# Patient Record
Sex: Male | Born: 1950 | Race: White | Hispanic: No | Marital: Single | Smoking: Current every day smoker
Health system: Southern US, Community
[De-identification: ages and names within clinical notes are randomized; demographics above are authoritative.]

## PROBLEM LIST (undated history)

## (undated) DIAGNOSIS — K219 Gastro-esophageal reflux disease without esophagitis: Secondary | ICD-10-CM

## (undated) DIAGNOSIS — Z205 Contact with and (suspected) exposure to viral hepatitis: Secondary | ICD-10-CM

## (undated) DIAGNOSIS — F419 Anxiety disorder, unspecified: Secondary | ICD-10-CM

## (undated) DIAGNOSIS — N189 Chronic kidney disease, unspecified: Secondary | ICD-10-CM

## (undated) DIAGNOSIS — J189 Pneumonia, unspecified organism: Secondary | ICD-10-CM

## (undated) DIAGNOSIS — R51 Headache: Secondary | ICD-10-CM

## (undated) DIAGNOSIS — M51369 Other intervertebral disc degeneration, lumbar region without mention of lumbar back pain or lower extremity pain: Secondary | ICD-10-CM

## (undated) DIAGNOSIS — R519 Headache, unspecified: Secondary | ICD-10-CM

## (undated) DIAGNOSIS — Z8719 Personal history of other diseases of the digestive system: Secondary | ICD-10-CM

## (undated) DIAGNOSIS — M5136 Other intervertebral disc degeneration, lumbar region: Secondary | ICD-10-CM

## (undated) DIAGNOSIS — I10 Essential (primary) hypertension: Secondary | ICD-10-CM

## (undated) HISTORY — PX: TONSILLECTOMY: SUR1361

## (undated) HISTORY — PX: TONSILLECTOMY AND ADENOIDECTOMY: SUR1326

## (undated) HISTORY — PX: BACK SURGERY: SHX140

## (undated) HISTORY — PX: FRACTURE SURGERY: SHX138

---

## 2005-07-26 ENCOUNTER — Emergency Department: Payer: Self-pay | Admitting: Emergency Medicine

## 2006-05-01 ENCOUNTER — Encounter: Payer: Self-pay | Admitting: Family Medicine

## 2006-05-01 ENCOUNTER — Ambulatory Visit: Payer: Self-pay | Admitting: Family Medicine

## 2006-11-10 ENCOUNTER — Ambulatory Visit: Payer: Self-pay | Admitting: Family Medicine

## 2006-11-10 DIAGNOSIS — M545 Low back pain, unspecified: Secondary | ICD-10-CM | POA: Insufficient documentation

## 2007-05-10 ENCOUNTER — Ambulatory Visit: Payer: Self-pay | Admitting: Family Medicine

## 2007-05-14 ENCOUNTER — Encounter: Payer: Self-pay | Admitting: Family Medicine

## 2007-05-23 ENCOUNTER — Encounter: Admission: RE | Admit: 2007-05-23 | Discharge: 2007-05-23 | Payer: Self-pay | Admitting: Neurosurgery

## 2007-05-28 ENCOUNTER — Encounter: Payer: Self-pay | Admitting: Family Medicine

## 2009-11-26 ENCOUNTER — Encounter (INDEPENDENT_AMBULATORY_CARE_PROVIDER_SITE_OTHER): Payer: Self-pay | Admitting: *Deleted

## 2010-01-01 ENCOUNTER — Ambulatory Visit: Payer: Self-pay | Admitting: Specialist

## 2010-05-22 NOTE — Letter (Signed)
Summary: Nadara Eaton letter  [place] at [facility]  [address] [place], [place] [address]   Phone: [phone]  Fax: [phone]       11/26/2009 MRN: [id number]  [facility] [address] [place], [place]  [address]  Dear Mr. Gittings,  [facility] [place] [place] announce the retirement of Arta Silence, M.D., from full-time practice at the [facility] office effective October 18, 2009 and his plans of returning part-time.  It is important to Dr. Hetty Ely and to our practice that you understand that [facility] has seven physicians in our office for your health care needs.  We will continue to offer the same exceptional care that you have today.    Dr. Hetty Ely has spoken to many of you about his plans for retirement and returning part-time in the fall.   We will continue to work with you through the transition to schedule appointments for you in the office and meet the high standards that [facility] is committed to.   Again, it is with great pleasure that we share the news that Dr. Hetty Ely will return to [facility] at [facility] in October of 2011 with a reduced schedule.    If you have any questions, or would like to request an appointment with one of our physicians, please call us at [phone] and press the option for Scheduling an appointment.  We take pleasure in providing you with excellent patient care and look forward to seeing you at your next office visit.  Our [facility] Physicians are:  Tillman Abide, M.D. Laurita Quint, M.D. Roxy Manns, M.D. Kerby Nora, M.D. Hannah Beat, M.D. Ruthe Mannan, M.D. We proudly welcomed Raechel Ache, M.D. and Eustaquio Boyden, M.D. to the practice in July/August 2011.  Sincerely,  [facility]

## 2011-12-12 DIAGNOSIS — M5126 Other intervertebral disc displacement, lumbar region: Secondary | ICD-10-CM | POA: Insufficient documentation

## 2011-12-12 DIAGNOSIS — G8929 Other chronic pain: Secondary | ICD-10-CM | POA: Insufficient documentation

## 2011-12-12 DIAGNOSIS — K922 Gastrointestinal hemorrhage, unspecified: Secondary | ICD-10-CM | POA: Insufficient documentation

## 2011-12-12 DIAGNOSIS — N529 Male erectile dysfunction, unspecified: Secondary | ICD-10-CM | POA: Insufficient documentation

## 2011-12-12 DIAGNOSIS — R21 Rash and other nonspecific skin eruption: Secondary | ICD-10-CM | POA: Insufficient documentation

## 2011-12-15 DIAGNOSIS — Z0189 Encounter for other specified special examinations: Secondary | ICD-10-CM | POA: Insufficient documentation

## 2011-12-15 DIAGNOSIS — N400 Enlarged prostate without lower urinary tract symptoms: Secondary | ICD-10-CM | POA: Insufficient documentation

## 2012-05-05 ENCOUNTER — Ambulatory Visit: Payer: Self-pay | Admitting: Physician Assistant

## 2012-12-23 DIAGNOSIS — IMO0002 Reserved for concepts with insufficient information to code with codable children: Secondary | ICD-10-CM | POA: Insufficient documentation

## 2013-10-31 DIAGNOSIS — R768 Other specified abnormal immunological findings in serum: Secondary | ICD-10-CM | POA: Insufficient documentation

## 2013-10-31 DIAGNOSIS — Z789 Other specified health status: Secondary | ICD-10-CM | POA: Insufficient documentation

## 2013-11-04 DIAGNOSIS — L720 Epidermal cyst: Secondary | ICD-10-CM | POA: Insufficient documentation

## 2014-01-09 ENCOUNTER — Ambulatory Visit: Payer: Self-pay | Admitting: Pain Medicine

## 2014-01-17 ENCOUNTER — Ambulatory Visit: Payer: Self-pay | Admitting: Gastroenterology

## 2015-04-15 ENCOUNTER — Encounter: Payer: Self-pay | Admitting: Emergency Medicine

## 2015-04-15 ENCOUNTER — Emergency Department
Admission: EM | Admit: 2015-04-15 | Discharge: 2015-04-15 | Disposition: A | Discharge status: Home / Self Care | Payer: 59 | Attending: Emergency Medicine | Admitting: Emergency Medicine

## 2015-04-15 DIAGNOSIS — M5136 Other intervertebral disc degeneration, lumbar region: Secondary | ICD-10-CM | POA: Diagnosis not present

## 2015-04-15 DIAGNOSIS — M5442 Lumbago with sciatica, left side: Secondary | ICD-10-CM

## 2015-04-15 DIAGNOSIS — M545 Low back pain: Secondary | ICD-10-CM | POA: Diagnosis present

## 2015-04-15 HISTORY — DX: Other intervertebral disc degeneration, lumbar region without mention of lumbar back pain or lower extremity pain: M51.369

## 2015-04-15 HISTORY — DX: Other intervertebral disc degeneration, lumbar region: M51.36

## 2015-04-15 MED ORDER — DIAZEPAM 2 MG PO TABS: 2.0000 mg | ORAL_TABLET | Freq: Three times a day (TID) | ORAL | Status: DC | PRN

## 2015-04-15 MED ORDER — TRAMADOL HCL 50 MG PO TABS: 50.0000 mg | ORAL_TABLET | Freq: Four times a day (QID) | ORAL | Status: DC | PRN

## 2015-04-15 NOTE — Discharge Instructions (Signed)
Chronic Back Pain  When back pain lasts longer than 3 months, it is called chronic back pain.People with chronic back pain often go through certain periods that are more intense (flare-ups).  CAUSES Chronic back pain can be caused by wear and tear (degeneration) on different structures in your back. These structures include:  The bones of your spine (vertebrae) and the joints surrounding your spinal cord and nerve roots (facets).  The strong, fibrous tissues that connect your vertebrae (ligaments). Degeneration of these structures may result in pressure on your nerves. This can lead to constant pain. HOME CARE INSTRUCTIONS  Avoid bending, heavy lifting, prolonged sitting, and activities which make the problem worse.  Take brief periods of rest throughout the day to reduce your pain. Lying down or standing usually is better than sitting while you are resting.  Take over-the-counter or prescription medicines only as directed by your caregiver. SEEK IMMEDIATE MEDICAL CARE IF:   You have weakness or numbness in one of your legs or feet.  You have trouble controlling your bladder or bowels.  You have nausea, vomiting, abdominal pain, shortness of breath, or fainting.   This information is not intended to replace advice given to you by your health care provider. Make sure you discuss any questions you have with your health care provider.   Document Released: 05/15/2004 Document Revised: 06/30/2011 Document Reviewed: 09/25/2014 Elsevier Interactive Patient Education 2016 Elsevier Inc.  Degenerative Disk Disease Degenerative disk disease is a condition caused by the changes that occur in spinal disks as you grow older. Spinal disks are soft and compressible disks located between the bones of your spine (vertebrae). These disks act like shock absorbers. Degenerative disk disease can affect the whole spine. However, the neck and lower back are most commonly affected. Many changes can occur in  the spinal disks with aging, such as:  The spinal disks may dry and shrink.  Small tears may occur in the tough, outer covering of the disk (annulus).  The disk space may become smaller due to loss of water.  Abnormal growths in the bone (spurs) may occur. This can put pressure on the nerve roots exiting the spinal canal, causing pain.  The spinal canal may become narrowed. RISK FACTORS   Being overweight.  Having a family history of degenerative disk disease.  Smoking.  There is increased risk if you are doing heavy lifting or have a sudden injury. SIGNS AND SYMPTOMS  Symptoms vary from person to person and may include:  Pain that varies in intensity. Some people have no pain, while others have severe pain. The location of the pain depends on the part of your backbone that is affected.  You will have neck or arm pain if a disk in the neck area is affected.  You will have pain in your back, buttocks, or legs if a disk in the lower back is affected.  Pain that becomes worse while bending, reaching up, or with twisting movements.  Pain that may start gradually and then get worse as time passes. It may also start after a major or minor injury.  Numbness or tingling in the arms or legs. DIAGNOSIS  Your health care provider will ask you about your symptoms and about activities or habits that may cause the pain. He or she may also ask about any injuries, diseases, or treatments you have had. Your health care provider will examine you to check for the range of movement that is possible in the affected area, to  check for strength in your extremities, and to check for sensation in the areas of the arms and legs supplied by different nerve roots. You may also have:   An X-ray of the spine.  Other imaging tests, such as MRI. TREATMENT  Your health care provider will advise you on the best plan for treatment. Treatment may include:  Medicines.  Rehabilitation exercises. HOME CARE  INSTRUCTIONS   Follow proper lifting and walking techniques as advised by your health care provider.  Maintain good posture.  Exercise regularly as advised by your health care provider.  Perform relaxation exercises.  Change your sitting, standing, and sleeping habits as advised by your health care provider.  Change positions frequently.  Lose weight or maintain a healthy weight as advised by your health care provider.  Do not use any tobacco products, including cigarettes, chewing tobacco, or electronic cigarettes. If you need help quitting, ask your health care provider.  Wear supportive footwear.  Take medicines only as directed by your health care provider. SEEK MEDICAL CARE IF:   Your pain does not go away within 1-4 weeks.  You have significant appetite or weight loss. SEEK IMMEDIATE MEDICAL CARE IF:   Your pain is severe.  You notice weakness in your arms, hands, or legs.  You begin to lose control of your bladder or bowel movements.  You have fevers or night sweats. MAKE SURE YOU:   Understand these instructions.  Will watch your condition.  Will get help right away if you are not doing well or get worse.   This information is not intended to replace advice given to you by your health care provider. Make sure you discuss any questions you have with your health care provider.   Document Released: 02/02/2007 Document Revised: 04/28/2014 Document Reviewed: 08/09/2013 Elsevier Interactive Patient Education [employer]2016 Elsevier Inc.

## 2015-04-15 NOTE — ED Provider Notes (Signed)
[facility] Emergency Department Provider Note  ____________________________________________  Time seen: Approximately 10:01 AM  I have reviewed the triage vital signs and the nursing notes.   HISTORY  Chief Complaint Back Pain    HPI Chad [name] is a 64 y.o. male who presents to the emergency department complaining of lower back pain. He states that he has a history of degenerative disc disease in his lumbar region. He has seen a spine specialist and told that he is not a surgical candidate. He denies any specific injury leading up to this incident of pain. He states pain is in the lower left region radiating down his left leg. He was seen in urgent care approximately 10 days ago and started on by mouth Toradol. He states that he has minor relief but is still continuing to experience symptoms. Patient states he does have some mild numbness tingling on the lateral aspect of left leg. No bowel or bladder dysfunction.no gait changes. Patient states that he does work out on a regular basis and has continued to do so.   Past Medical History  Diagnosis Date  . Degenerative disc disease, lumbar     Patient Active Problem List   Diagnosis Date Noted  . BACK PAIN, LUMBAR 11/10/2006    History reviewed. No pertinent past surgical history.  Current Outpatient Rx  Name  Route  Sig  Dispense  Refill  . ketorolac (TORADOL) 10 MG tablet   Oral   Take 10 mg by mouth every 6 (six) hours as needed.         . diazepam (VALIUM) 2 MG tablet   Oral   Take 1 tablet (2 mg total) by mouth every 8 (eight) hours as needed for anxiety.   15 tablet   0   . traMADol (ULTRAM) 50 MG tablet   Oral   Take 1 tablet (50 mg total) by mouth every 6 (six) hours as needed.   20 tablet   0     Allergies Hydrocodone-acetaminophen  No family history on file.  Social History Social History  Substance Use Topics  . Smoking status: Never Smoker   . Smokeless tobacco: None   . Alcohol Use: No    Review of Systems Constitutional: No fever/chills Eyes: No visual changes. ENT: No sore throat. Cardiovascular: Denies chest pain. Respiratory: Denies shortness of breath. Gastrointestinal: No abdominal pain.  No nausea, no vomiting.  No diarrhea.  No constipation. Genitourinary: Negative for dysuria. Musculoskeletal: Endorses left-sided lower back pain. Skin: Negative for rash. Neurological: Negative for headaches, focal weakness or numbness.  10-point ROS otherwise negative.  ____________________________________________   PHYSICAL EXAM:  VITAL SIGNS: ED Triage Vitals  Enc Vitals Group     BP 04/15/15 0957 152/96 mmHg     Pulse Rate 04/15/15 0957 86     Resp 04/15/15 0957 18     Temp 04/15/15 0957 98.1 F (36.7 C)     Temp src --      SpO2 04/15/15 0957 99 %     Weight 04/15/15 0957 175 lb (79.379 kg)     Height 04/15/15 0957  (1.753 m)     Head Cir --      Peak Flow --      Pain Score 04/15/15 0958 9     Pain Loc --      Pain Edu? --      Excl. in GC? --     Constitutional: Alert and oriented. Well appearing and in  no acute distress. Eyes: Conjunctivae are normal. PERRL. EOMI. Head: Atraumatic. Nose: No congestion/rhinnorhea. Mouth/Throat: Mucous membranes are moist.  Oropharynx non-erythematous. Neck: No stridor.   Cardiovascular: Normal rate, regular rhythm. Grossly normal heart sounds.  Good peripheral circulation. Respiratory: Normal respiratory effort.  No retractions. Lungs CTAB. Gastrointestinal: Soft and nontender. No distention. No abdominal bruits. No CVA tenderness. Musculoskeletal: No lower extremity tenderness nor edema.  No joint effusions. No visible deformity to spine. Inspection. No abnormalities to palpation. No scoliosis or lordosis noted. No tenderness to palpation midline spinal processes. Patient is diffusely tender to palpation over the paraspinal muscle group left side lumbar region. Positive straight leg raise  on left side. Pulses and sensation intact distally. Neurologic:  Normal speech and language. No gross focal neurologic deficits are appreciated. No gait instability. Skin:  Skin is warm, dry and intact. No rash noted. Psychiatric: Mood and affect are normal. Speech and behavior are normal.  ____________________________________________   LABS (all labs ordered are listed, but only abnormal results are displayed)  Labs Reviewed - No data to display ____________________________________________  EKG   ____________________________________________  RADIOLOGY   ____________________________________________   PROCEDURES  Procedure(s) performed: None  Critical Care performed: No  ____________________________________________   INITIAL IMPRESSION / ASSESSMENT AND PLAN / ED COURSE  Pertinent labs & imaging results that were available during my care of the patient were reviewed by me and considered in my medical decision making (see chart for details).  Patient's diagnosis is consistent with degenerative disc disease with sciatica. Patient is already on anti-inflammatories. I will start patient on muscle relaxers and limited narcotics for pain control. Patient is not a surgical candidate according to spine specialist and has been recommended to be seen by pain management. Patient is currently not on pain management services. Patient is to follow-up with his spine specialist.    New Prescriptions   DIAZEPAM (VALIUM) 2 MG TABLET    Take 1 tablet (2 mg total) by mouth every 8 (eight) hours as needed for anxiety.   TRAMADOL (ULTRAM) 50 MG TABLET    Take 1 tablet (50 mg total) by mouth every 6 (six) hours as needed.    ____________________________________________   FINAL CLINICAL IMPRESSION(S) / ED DIAGNOSES  Final diagnoses:  Degenerative disc disease, lumbar  Acute left-sided low back pain with left-sided sciatica      Racheal PatchesJonathan D Cuthriell, PA-C 04/15/15 1016  Jene Everyobert  Kinner, MD 04/15/15 1407

## 2015-04-15 NOTE — ED Notes (Addendum)
Patient to ER for c/o lower back pain that radiates down leg. Has h/o degenerative disc disease. Saw urgent care last week x2 with no relief from Toradol. Denies any known injury.

## 2015-04-15 NOTE — ED Notes (Signed)
Lower back pain for about 1-2 weeks w/o injury.no fever or urinary sxs' has been seen for same times 2  Ambulates with sl limp d/t pain

## 2015-04-29 ENCOUNTER — Emergency Department
Admission: EM | Admit: 2015-04-29 | Discharge: 2015-04-29 | Disposition: A | Discharge status: Home / Self Care | Payer: Managed Care, Other (non HMO) | Attending: Emergency Medicine | Admitting: Emergency Medicine

## 2015-04-29 ENCOUNTER — Encounter: Payer: Self-pay | Admitting: Emergency Medicine

## 2015-04-29 DIAGNOSIS — M5136 Other intervertebral disc degeneration, lumbar region: Secondary | ICD-10-CM

## 2015-04-29 DIAGNOSIS — M5126 Other intervertebral disc displacement, lumbar region: Secondary | ICD-10-CM | POA: Diagnosis not present

## 2015-04-29 DIAGNOSIS — M545 Low back pain: Secondary | ICD-10-CM | POA: Diagnosis present

## 2015-04-29 MED ORDER — CYCLOBENZAPRINE HCL 10 MG PO TABS: 10.0000 mg | ORAL_TABLET | Freq: Three times a day (TID) | ORAL | Status: DC | PRN

## 2015-04-29 MED ORDER — OXYCODONE-ACETAMINOPHEN 10-325 MG PO TABS: 1.0000 | ORAL_TABLET | Freq: Four times a day (QID) | ORAL | Status: AC | PRN

## 2015-04-29 MED ORDER — IBUPROFEN 800 MG PO TABS: 800.0000 mg | ORAL_TABLET | Freq: Three times a day (TID) | ORAL | Status: DC | PRN

## 2015-04-29 NOTE — ED Notes (Signed)
Pt c/o lower back pain.  Seen here on christmas and given tramadol which he reports does not touch pain.  Had MRI Friday but won't get results for week and needs something else for pain.  He said they think he has herniated disc.

## 2015-04-29 NOTE — Discharge Instructions (Signed)
Herniated Disk  A herniated disk occurs when a disk in your spine bulges out too far. This condition is also called a ruptured disk or slipped disk. Your spine (backbone) is made up of bones called vertebrae. Between each pair of vertebrae is an oval disk with a soft, spongy center that acts as a shock absorber when you move. The spongy center is surrounded by a tough outer ring.  When you have a herniated disk, the spongy center of the disk bulges out or ruptures through the outer ring. A herniated disk can press on a nerve between your vertebrae and cause pain. A herniated disk can occur anywhere in your back or neck area, but the lower back is the most common spot.  CAUSES   In many cases, a herniated disk occurs just from getting older. As you age, the spongy insides of your disks tend to shrink and dry out. A herniated disk can result from gradual wear and tear. Injury or sudden strain can also cause a herniated disk.   RISK FACTORS  Aging is the main risk factor for a herniated disk. Other risk factors include:   Being a man between the ages of 30 and 50 years.   Having a job that requires heavy lifting, bending, or twisting.   Having a job that requires long hours of driving.   Not getting enough exercise.   Being overweight.   Smoking.  SIGNS AND SYMPTOMS   Signs and symptoms depend on which disk is herniated.   For a herniated disk in the lower back, you may have sharp pain in:    One part of your leg, hip, or buttocks.    The back of your calf.    The top or sole of your foot (sciatica).    For a herniated disk in the neck, you may feel pain:    When you move your neck.    Near or over your shoulder blade.    That moves to your upper arm, forearm, or fingers.    You may also have muscle weakness. It may be hard to:    Lift your leg or arm.    Stand on your toes.    Squeeze tightly with one of your hands.   Other symptoms can include:    Numbness or tingling in the affected areas of your body.     Loss of bladder or bowel control. This is a rare but serious sign of a severe herniated disk in the lower back.  DIAGNOSIS   Your health care provider will do a physical exam. During this exam, you may have to move certain body parts or assume various positions. For example, your health care provider may do the straight-leg test. This is a good way to test for a herniated disk in your lower back. In this test, the health care provider lifts your leg while you lie on your back. This is to see if you feel pain down your leg. Your health care provider will also check for numbness or loss of feeling.   Your health care provider will also check your:   Reflexes.   Muscle strength.   Posture.   Other tests may be done to help in making a diagnosis. These may include:   An X-ray of the spine to rule out other causes of back pain.    Other imaging studies, such as an MRI or CT scan. This is to check whether the herniated disk is   pressing on your spinal canal.   Electromyography (EMG). This test checks the nerves that control muscles. It is sometimes used to identify the specific area of nerve involvement.   TREATMENT   In many cases, herniated disk symptoms go away over a period of days or weeks. You will most likely be free of symptoms in 3-4 months. Treatment may include the following:   The initial treatment for a herniated disk is ashort period of rest.    Bed rest is often limited to 1 or 2 days. Resting for too long delays recovery.    If you have a herniated disk in your lower back, you should avoid sitting as much as possible because sitting increases pressure on the disk.   Medicines. These may include:     Nonsteroidal anti-inflammatory drugs (NSAIDs).    Muscle relaxants for back spasms.    Narcotic pain medicine if your pain is very bad.    Steroid injections. You may need these along the involved nerve root to help control pain. The steroid is injected in the area of the herniated disk. It  helps by reducing swelling around the disk.   Physical therapy. This may include exercises to strengthen the muscles that help support your spine.    You may need surgery if other treatments do not work.   HOME CARE INSTRUCTIONS  Follow all your health care provider's instructions. These may include:   Take all medicines as directed by your health care provider.   Rest for 2 days and then start moving.   Do not sit or stand for long periods of time.   Maintain good posture when sitting and standing.   Avoid movements that cause pain, such as bending or lifting.   When you are able to start lifting things again:   Bend with your knees.   Keep your back straight.   Hold heavy objects close to your body.   If you are overweight, ask your health care provider to help you start a weight-loss program.   When you are able to start exercising, ask your health care provider how much and what type of exercise is best for you.   Work with a physical therapist on stretching and strengthening exercises for your back.   Do not wear high-heeled shoes.   Do not sleep on your belly.   Do not smoke.   Keep all follow-up visits as directed by your health care provider.  SEEK MEDICAL CARE IF:   You have back or neck pain that is not getting better after 4 weeks.   You have very bad pain in your back or neck.   You develop numbness, tingling, or weakness along with pain.  SEEK IMMEDIATE MEDICAL CARE IF:    You have numbness, tingling, or weakness that makes you unable to use your arms or legs.   You lose control of your bladder or bowels.   You have dizziness or fainting.   You have shortness of breath.   MAKE SURE YOU:    Understand these instructions.   Will watch your condition.   Will get help right away if you are not doing well or get worse.     This information is not intended to replace advice given to you by your health care provider. Make sure you discuss any questions you have with your health  care provider.     Document Released: 04/04/2000 Document Revised: 04/28/2014 Document Reviewed: 03/11/2013  Elsevier Interactive Patient Education   2016 Else[employer] Inc.

## 2015-04-29 NOTE — ED Provider Notes (Signed)
[facility] Emergency Department Provider Note  ____________________________________________  Time seen: Approximately 2:50 PM  I have reviewed the triage vital signs and the nursing notes.   HISTORY  Chief Complaint Back Pain    HPI Chad [name] is a 65 y.o. male presents for evaluation of left lower back pain. Patient states he was seen here on Christmas day for same. No relief with current medications. Just had an MRI this week.Planes of continuous pain radiating down his left leg and groin.   Past Medical History  Diagnosis Date  . Degenerative disc disease, lumbar     Patient Active Problem List   Diagnosis Date Noted  . BACK PAIN, LUMBAR 11/10/2006    History reviewed. No pertinent past surgical history.  Current Outpatient Rx  Name  Route  Sig  Dispense  Refill  . cyclobenzaprine (FLEXERIL) 10 MG tablet   Oral   Take 1 tablet (10 mg total) by mouth every 8 (eight) hours as needed for muscle spasms.   30 tablet   1   . ibuprofen (ADVIL,MOTRIN) 800 MG tablet   Oral   Take 1 tablet (800 mg total) by mouth every 8 (eight) hours as needed.   30 tablet   0   . oxyCODONE-acetaminophen (PERCOCET) 10-325 MG tablet   Oral   Take 1 tablet by mouth every 6 (six) hours as needed for pain.   30 tablet   0     Allergies Hydrocodone-acetaminophen  History reviewed. No pertinent family history.  Social History Social History  Substance Use Topics  . Smoking status: Never Smoker   . Smokeless tobacco: None  . Alcohol Use: No    Review of Systems Constitutional: No fever/chills Eyes: No visual changes. ENT: No sore throat. Cardiovascular: Denies chest pain. Respiratory: Denies shortness of breath. Gastrointestinal: No abdominal pain.  No nausea, no vomiting.  No diarrhea.  No constipation. Genitourinary: Negative for dysuria. Musculoskeletal: Positive for low back pain. Skin: Negative for rash. Neurological: Negative for  headaches, focal weakness or numbness.  10-point ROS otherwise negative.  ____________________________________________   PHYSICAL EXAM:  VITAL SIGNS: ED Triage Vitals  Enc Vitals Group     BP 04/29/15 1351 135/103 mmHg     Pulse Rate 04/29/15 1350 96     Resp 04/29/15 1350 16     Temp 04/29/15 1350 97.7 F (36.5 C)     Temp Source 04/29/15 1350 Oral     SpO2 04/29/15 1350 100 %     Weight 04/29/15 1350 180 lb (81.647 kg)     Height 04/29/15 1350 5\' 9"  (1.753 m)     Head Cir --      Peak Flow --      Pain Score 04/29/15 1350 10     Pain Loc --      Pain Edu? --      Excl. in GC? --     Constitutional: Alert and oriented. Well appearing and in no acute distress.   Cardiovascular: Normal rate, regular rhythm. Grossly normal heart sounds.  Good peripheral circulation. Respiratory: Normal respiratory effort.  No retractions. Lungs CTAB. Gastrointestinal: Soft and nontender. No distention. No abdominal bruits. No CVA tenderness. Musculoskeletal: No lower extremity tenderness nor edema.  No joint effusions. Neurologic:  Normal speech and language. No gross focal neurologic deficits are appreciated. No gait instability. Skin:  Skin is warm, dry and intact. No rash noted. Psychiatric: Mood and affect are normal. Speech and behavior are normal.  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  ____________________________________________   PROCEDURES  Procedure(s) performed: None  Critical Care performed:  No  ____________________________________________   INITIAL IMPRESSION / ASSESSMENT AND PLAN / ED COURSE  Pertinent labs & imaging results that were available during my care of the patient were reviewed by me and considered in my medical decision making (see chart for details).  Acute lumbar bulging disc. Rx given for Percocet 5/325 and referral to neurosurgery. Patient follow-up PCP or return to the ER as needed. Patient also given prescription for Flexeril 10 mg 3 times a day and Motrin 800 mg 3 times a day. Highly unlikely to medication profile any difference to the patient's pain. Neurosurgery or pain management. ____________________________________________   FINAL CLINICAL IMPRESSION(S) / ED DIAGNOSES  Final diagnoses:  Bulging lumbar disc      Evangeline Dakin, PA-C 04/29/15 1459  Rockne Menghini, MD 04/29/15 1503

## 2015-05-11 ENCOUNTER — Other Ambulatory Visit: Payer: Self-pay | Admitting: Neurosurgery

## 2015-05-30 ENCOUNTER — Other Ambulatory Visit (HOSPITAL_COMMUNITY): Payer: Self-pay | Admitting: *Deleted

## 2015-05-30 ENCOUNTER — Encounter (HOSPITAL_COMMUNITY): Payer: Self-pay

## 2015-05-30 ENCOUNTER — Encounter (HOSPITAL_COMMUNITY)
Admission: RE | Admit: 2015-05-30 | Discharge: 2015-05-30 | Disposition: A | Discharge status: Home / Self Care | Payer: Managed Care, Other (non HMO) | Source: Ambulatory Visit | Attending: Neurosurgery | Admitting: Neurosurgery

## 2015-05-30 DIAGNOSIS — Z0183 Encounter for blood typing: Secondary | ICD-10-CM | POA: Diagnosis not present

## 2015-05-30 DIAGNOSIS — I129 Hypertensive chronic kidney disease with stage 1 through stage 4 chronic kidney disease, or unspecified chronic kidney disease: Secondary | ICD-10-CM | POA: Diagnosis not present

## 2015-05-30 DIAGNOSIS — N189 Chronic kidney disease, unspecified: Secondary | ICD-10-CM | POA: Diagnosis not present

## 2015-05-30 DIAGNOSIS — Z01812 Encounter for preprocedural laboratory examination: Secondary | ICD-10-CM | POA: Insufficient documentation

## 2015-05-30 DIAGNOSIS — Z79899 Other long term (current) drug therapy: Secondary | ICD-10-CM | POA: Insufficient documentation

## 2015-05-30 DIAGNOSIS — Z01818 Encounter for other preprocedural examination: Secondary | ICD-10-CM | POA: Insufficient documentation

## 2015-05-30 DIAGNOSIS — M4806 Spinal stenosis, lumbar region: Secondary | ICD-10-CM | POA: Diagnosis not present

## 2015-05-30 HISTORY — DX: Contact with and (suspected) exposure to viral hepatitis: Z20.5

## 2015-05-30 HISTORY — DX: Pneumonia, unspecified organism: J18.9

## 2015-05-30 HISTORY — DX: Chronic kidney disease, unspecified: N18.9

## 2015-05-30 HISTORY — DX: Gastro-esophageal reflux disease without esophagitis: K21.9

## 2015-05-30 HISTORY — DX: Headache, unspecified: R51.9

## 2015-05-30 HISTORY — DX: Headache: R51

## 2015-05-30 HISTORY — DX: Personal history of other diseases of the digestive system: Z87.19

## 2015-05-30 HISTORY — DX: Essential (primary) hypertension: I10

## 2015-05-30 HISTORY — DX: Anxiety disorder, unspecified: F41.9

## 2015-05-30 LAB — COMPREHENSIVE METABOLIC PANEL
ALK PHOS: 46 U/L (ref 38–126)
ALT: 34 U/L (ref 17–63)
AST: 39 U/L (ref 15–41)
Albumin: 2.9 g/dL — ABNORMAL LOW (ref 3.5–5.0)
Anion gap: 9 (ref 5–15)
BUN: 9 mg/dL (ref 6–20)
CALCIUM: 9 mg/dL (ref 8.9–10.3)
CO2: 28 mmol/L (ref 22–32)
CREATININE: 1.32 mg/dL — AB (ref 0.61–1.24)
Chloride: 102 mmol/L (ref 101–111)
GFR, EST NON AFRICAN AMERICAN: 56 mL/min — AB (ref >60)
Glucose, Bld: 74 mg/dL (ref 65–99)
Potassium: 4 mmol/L (ref 3.5–5.1)
Sodium: 139 mmol/L (ref 135–145)
Total Bilirubin: 0.7 mg/dL (ref 0.3–1.2)
Total Protein: 5.5 g/dL — ABNORMAL LOW (ref 6.5–8.1)

## 2015-05-30 LAB — CBC
HCT: 44.2 % (ref 39.0–52.0)
Hemoglobin: 14.4 g/dL (ref 13.0–17.0)
MCH: 28.6 pg (ref 26.0–34.0)
MCHC: 32.6 g/dL (ref 30.0–36.0)
MCV: 87.7 fL (ref 78.0–100.0)
PLATELETS: 305 10*3/uL (ref 150–400)
RBC: 5.04 MIL/uL (ref 4.22–5.81)
RDW: 14.2 % (ref 11.5–15.5)
WBC: 9.6 10*3/uL (ref 4.0–10.5)

## 2015-05-30 LAB — PROTIME-INR
INR: 1.02 (ref 0.00–1.49)
PROTHROMBIN TIME: 13.6 s (ref 11.6–15.2)

## 2015-05-30 LAB — SURGICAL PCR SCREEN
MRSA, PCR: NEGATIVE
Staphylococcus aureus: POSITIVE — AB

## 2015-05-30 LAB — TYPE AND SCREEN
ABO/RH(D): A POS
ANTIBODY SCREEN: NEGATIVE

## 2015-05-30 LAB — ABO/RH: ABO/RH(D): A POS

## 2015-05-30 NOTE — Progress Notes (Addendum)
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 Have called Rx in to [facility] in [place].

## 2015-05-30 NOTE — Pre-Procedure Instructions (Signed)
Chad [name]  05/30/2015      [id number] - [name], [place] - [address] [place] [address] Phone: [phone] Fax: [phone]    Your procedure is scheduled on Thursday, June 07, 2015 at 7:30 AM.   Report to [facility] Entrance "A" Admitting Office at 5:30 AM.   Call this number if you have problems the morning of surgery: [phone]   Any questions prior to day of surgery, please call [phone] between 8 & 4 PM.   Remember:  Do not eat food or drink liquids after midnight Wednesday, 06/06/15.  Take these medicines the morning of surgery with A SIP OF WATER: Escitalopram (Lexapro), Oxycodone - if needed  Stop Aspirin products (BC powders, Goody's, etc) as of today.   Do not wear jewelry.  Do not wear lotions, powders, or cologne.  You may wear deodorant.  Men may shave face and neck.  Do not bring valuables to the hospital.  [facility] is not responsible for any belongings or valuables.  Contacts, dentures or bridgework may not be worn into surgery.  Leave your suitcase in the car.  After surgery it may be brought to your room.  For patients admitted to the hospital, discharge time will be determined by your treatment team.  Special instructions:  [place] - Preparing for Surgery  Before surgery, you can play an important role.  Because skin is not sterile, your skin needs to be as free of germs as possible.  You can reduce the number of germs on you skin by washing with CHG (chlorahexidine gluconate) soap before surgery.  CHG is an antiseptic cleaner which kills germs and bonds with the skin to continue killing germs even after washing.  Please DO NOT use if you have an allergy to CHG or antibacterial soaps.  If your skin becomes reddened/irritated stop using the CHG and inform your nurse when you arrive at Short Stay.  Do not shave (including legs and underarms) for at least 48 hours prior to the first CHG shower.  You may  shave your face.  Please follow these instructions carefully:   1.  Shower with CHG Soap the night before surgery and the                                morning of Surgery.  2.  If you choose to wash your hair, wash your hair first as usual with your       normal shampoo.  3.  After you shampoo, rinse your hair and body thoroughly to remove the                      Shampoo.  4.  Use CHG as you would any other liquid soap.  You can apply chg directly       to the skin and wash gently with scrungie or a clean washcloth.  5.  Apply the CHG Soap to your body ONLY FROM THE NECK DOWN.        Do not use on open wounds or open sores.  Avoid contact with your eyes, ears, mouth and genitals (private parts).  Wash genitals (private parts) with your normal soap.  6.  Wash thoroughly, paying special attention to the area where your surgery        will be performed.  7.  Thoroughly rinse your body  with warm water from the neck down.  8.  DO NOT shower/wash with your normal soap after using and rinsing off       the CHG Soap.  9.  Pat yourself dry with a clean towel.            10.  Wear clean pajamas.            11.  Place clean sheets on your bed the night of your first shower and do not        sleep with pets.  Day of Surgery  Do not apply any lotions the morning of surgery.  Please wear clean clothes to the hospital.   Please read over the following fact sheets that you were given. Pain Booklet, Coughing and Deep Breathing, Blood Transfusion Information, MRSA Information and Surgical Site Infection Prevention

## 2015-06-01 ENCOUNTER — Encounter (HOSPITAL_COMMUNITY): Payer: Self-pay

## 2015-06-01 NOTE — Progress Notes (Signed)
Anesthesia Chart Review:  Pt is a 65 year old male scheduled for L4-5 posterior lumbar fusion on 06/07/2015 with Dr. Newell Coral.   PMH includes: HTN (diet controlled), CKD, exposure to hepatitis C. Never smoker. BMI 26.  Medications include: doxazosin  Preoperative labs reviewed.    EKG 06/26/14: NSR. LVH. Anterior ST elevation, probably due to LVH. EKG obtained during visit with PCP for f/u HTN; confirmed by Dr. Marlane Mingle, cardiologist at [facility].   If no changes, I anticipate pt can proceed with surgery as scheduled.   Rica Mast, FNP-BC [facility] Short Stay Surgical [facility]/Anesthesiology Phone: [phone] 06/01/2015 11:38 AM

## 2015-06-06 MED ORDER — CEFAZOLIN SODIUM-DEXTROSE 2-3 GM-% IV SOLR
2.0000 g | INTRAVENOUS | Status: AC
Start: 2015-06-07 — End: 2015-06-07
  Administered 2015-06-07: 2 g via INTRAVENOUS
  Filled 2015-06-06: qty 50

## 2015-06-07 ENCOUNTER — Inpatient Hospital Stay (HOSPITAL_COMMUNITY): Payer: Managed Care, Other (non HMO) | Admitting: Anesthesiology

## 2015-06-07 ENCOUNTER — Encounter (HOSPITAL_COMMUNITY): Payer: Self-pay | Admitting: Anesthesiology

## 2015-06-07 ENCOUNTER — Inpatient Hospital Stay (HOSPITAL_COMMUNITY): Payer: Managed Care, Other (non HMO) | Admitting: Emergency Medicine

## 2015-06-07 ENCOUNTER — Inpatient Hospital Stay (HOSPITAL_COMMUNITY): Payer: Managed Care, Other (non HMO)

## 2015-06-07 ENCOUNTER — Encounter (HOSPITAL_COMMUNITY)
Admission: RE | Disposition: A | Discharge status: Home / Self Care | Payer: Self-pay | Source: Ambulatory Visit | Attending: Neurosurgery

## 2015-06-07 ENCOUNTER — Inpatient Hospital Stay (HOSPITAL_COMMUNITY)
Admission: RE | Admit: 2015-06-07 | Discharge: 2015-06-08 | DRG: 460 | Disposition: A | Discharge status: Home / Self Care | Payer: Managed Care, Other (non HMO) | Source: Ambulatory Visit | Attending: Neurosurgery | Admitting: Neurosurgery

## 2015-06-07 DIAGNOSIS — Z419 Encounter for procedure for purposes other than remedying health state, unspecified: Secondary | ICD-10-CM

## 2015-06-07 DIAGNOSIS — M4316 Spondylolisthesis, lumbar region: Secondary | ICD-10-CM | POA: Diagnosis present

## 2015-06-07 DIAGNOSIS — N189 Chronic kidney disease, unspecified: Secondary | ICD-10-CM | POA: Diagnosis present

## 2015-06-07 DIAGNOSIS — I129 Hypertensive chronic kidney disease with stage 1 through stage 4 chronic kidney disease, or unspecified chronic kidney disease: Secondary | ICD-10-CM | POA: Diagnosis present

## 2015-06-07 DIAGNOSIS — M5136 Other intervertebral disc degeneration, lumbar region: Principal | ICD-10-CM | POA: Diagnosis present

## 2015-06-07 DIAGNOSIS — Z79899 Other long term (current) drug therapy: Secondary | ICD-10-CM

## 2015-06-07 DIAGNOSIS — Z205 Contact with and (suspected) exposure to viral hepatitis: Secondary | ICD-10-CM | POA: Diagnosis present

## 2015-06-07 DIAGNOSIS — M48062 Spinal stenosis, lumbar region with neurogenic claudication: Secondary | ICD-10-CM | POA: Diagnosis present

## 2015-06-07 DIAGNOSIS — F419 Anxiety disorder, unspecified: Secondary | ICD-10-CM | POA: Diagnosis present

## 2015-06-07 DIAGNOSIS — K219 Gastro-esophageal reflux disease without esophagitis: Secondary | ICD-10-CM | POA: Diagnosis present

## 2015-06-07 DIAGNOSIS — M4806 Spinal stenosis, lumbar region: Secondary | ICD-10-CM | POA: Diagnosis present

## 2015-06-07 DIAGNOSIS — M47816 Spondylosis without myelopathy or radiculopathy, lumbar region: Secondary | ICD-10-CM | POA: Diagnosis present

## 2015-06-07 DIAGNOSIS — M25552 Pain in left hip: Secondary | ICD-10-CM | POA: Diagnosis not present

## 2015-06-07 SURGERY — POSTERIOR LUMBAR FUSION 1 LEVEL
Anesthesia: General | Site: Back

## 2015-06-07 MED ORDER — PROPOFOL 10 MG/ML IV BOLUS
INTRAVENOUS | Status: DC | PRN
Start: 2015-06-07 — End: 2015-06-07
  Administered 2015-06-07: 160 mg via INTRAVENOUS

## 2015-06-07 MED ORDER — OXYCODONE HCL 5 MG PO TABS
ORAL_TABLET | ORAL | Status: AC
  Filled 2015-06-07: qty 1

## 2015-06-07 MED ORDER — ROCURONIUM BROMIDE 100 MG/10ML IV SOLN
INTRAVENOUS | Status: DC | PRN
  Administered 2015-06-07: 50 mg via INTRAVENOUS

## 2015-06-07 MED ORDER — OXYCODONE HCL 5 MG/5ML PO SOLN: 5.0000 mg | Freq: Once | ORAL | Status: DC | PRN

## 2015-06-07 MED ORDER — DEXAMETHASONE SODIUM PHOSPHATE 4 MG/ML IJ SOLN
INTRAMUSCULAR | Status: AC
  Filled 2015-06-07: qty 1

## 2015-06-07 MED ORDER — OXYCODONE-ACETAMINOPHEN 5-325 MG PO TABS
1.0000 | ORAL_TABLET | ORAL | Status: DC | PRN
  Administered 2015-06-07 – 2015-06-08 (×4): 2 via ORAL
  Filled 2015-06-07 (×4): qty 2

## 2015-06-07 MED ORDER — FENTANYL CITRATE (PF) 250 MCG/5ML IJ SOLN
INTRAMUSCULAR | Status: AC
  Filled 2015-06-07: qty 5

## 2015-06-07 MED ORDER — PHENYLEPHRINE 40 MCG/ML (10ML) SYRINGE FOR IV PUSH (FOR BLOOD PRESSURE SUPPORT)
PREFILLED_SYRINGE | INTRAVENOUS | Status: AC
  Filled 2015-06-07: qty 10

## 2015-06-07 MED ORDER — OXYCODONE HCL 5 MG PO TABS
5.0000 mg | ORAL_TABLET | Freq: Once | ORAL | Status: AC
  Administered 2015-06-07: 5 mg via ORAL

## 2015-06-07 MED ORDER — DEXAMETHASONE SODIUM PHOSPHATE 4 MG/ML IJ SOLN
INTRAMUSCULAR | Status: DC | PRN
  Administered 2015-06-07: 4 mg via INTRAVENOUS

## 2015-06-07 MED ORDER — BUPIVACAINE HCL (PF) 0.5 % IJ SOLN
INTRAMUSCULAR | Status: DC | PRN
  Administered 2015-06-07: 10 mL

## 2015-06-07 MED ORDER — GELATIN ABSORBABLE MT POWD
OROMUCOSAL | Status: DC | PRN
  Administered 2015-06-07: 11:00:00 via TOPICAL

## 2015-06-07 MED ORDER — FENTANYL CITRATE (PF) 100 MCG/2ML IJ SOLN
INTRAMUSCULAR | Status: DC | PRN
  Administered 2015-06-07: 50 ug via INTRAVENOUS
  Administered 2015-06-07: 100 ug via INTRAVENOUS
  Administered 2015-06-07 (×4): 50 ug via INTRAVENOUS

## 2015-06-07 MED ORDER — KETOROLAC TROMETHAMINE 30 MG/ML IJ SOLN
15.0000 mg | Freq: Four times a day (QID) | INTRAMUSCULAR | Status: DC
  Administered 2015-06-07 – 2015-06-08 (×3): 15 mg via INTRAVENOUS
  Filled 2015-06-07 (×3): qty 1

## 2015-06-07 MED ORDER — ESCITALOPRAM OXALATE 10 MG PO TABS
10.0000 mg | ORAL_TABLET | Freq: Every day | ORAL | Status: DC
  Administered 2015-06-07 – 2015-06-08 (×2): 10 mg via ORAL
  Filled 2015-06-07 (×2): qty 1

## 2015-06-07 MED ORDER — ACETAMINOPHEN 10 MG/ML IV SOLN
INTRAVENOUS | Status: AC
  Administered 2015-06-07: 1000 mg via INTRAVENOUS
  Filled 2015-06-07: qty 100

## 2015-06-07 MED ORDER — GABAPENTIN 300 MG PO CAPS
300.0000 mg | ORAL_CAPSULE | Freq: Three times a day (TID) | ORAL | Status: DC
  Administered 2015-06-07 – 2015-06-08 (×3): 300 mg via ORAL
  Filled 2015-06-07 (×3): qty 1

## 2015-06-07 MED ORDER — KCL IN DEXTROSE-NACL 20-5-0.45 MEQ/L-%-% IV SOLN: INTRAVENOUS | Status: DC

## 2015-06-07 MED ORDER — 0.9 % SODIUM CHLORIDE (POUR BTL) OPTIME
TOPICAL | Status: DC | PRN
  Administered 2015-06-07 (×2): 1000 mL

## 2015-06-07 MED ORDER — SODIUM CHLORIDE 0.9% FLUSH
3.0000 mL | Freq: Two times a day (BID) | INTRAVENOUS | Status: DC
Start: 2015-06-07 — End: 2015-06-08

## 2015-06-07 MED ORDER — CYCLOBENZAPRINE HCL 10 MG PO TABS
10.0000 mg | ORAL_TABLET | Freq: Three times a day (TID) | ORAL | Status: DC | PRN
  Administered 2015-06-07 (×2): 10 mg via ORAL
  Filled 2015-06-07: qty 1

## 2015-06-07 MED ORDER — SUGAMMADEX SODIUM 200 MG/2ML IV SOLN
INTRAVENOUS | Status: AC
  Filled 2015-06-07: qty 2

## 2015-06-07 MED ORDER — HYDROCODONE-ACETAMINOPHEN 5-325 MG PO TABS: 1.0000 | ORAL_TABLET | ORAL | Status: DC | PRN

## 2015-06-07 MED ORDER — HYDROXYZINE HCL 25 MG PO TABS: 50.0000 mg | ORAL_TABLET | ORAL | Status: DC | PRN

## 2015-06-07 MED ORDER — ALUM & MAG HYDROXIDE-SIMETH 200-200-20 MG/5ML PO SUSP: 30.0000 mL | Freq: Four times a day (QID) | ORAL | Status: DC | PRN

## 2015-06-07 MED ORDER — LIDOCAINE HCL (CARDIAC) 20 MG/ML IV SOLN
INTRAVENOUS | Status: AC
Start: 2015-06-07 — End: 2015-06-07
  Filled 2015-06-07: qty 5

## 2015-06-07 MED ORDER — SUCCINYLCHOLINE CHLORIDE 20 MG/ML IJ SOLN
INTRAMUSCULAR | Status: AC
  Filled 2015-06-07: qty 1

## 2015-06-07 MED ORDER — HYDROMORPHONE HCL 1 MG/ML IJ SOLN
INTRAMUSCULAR | Status: AC
  Filled 2015-06-07: qty 1

## 2015-06-07 MED ORDER — HYDROMORPHONE HCL 1 MG/ML IJ SOLN
INTRAMUSCULAR | Status: AC
  Administered 2015-06-07: 0.5 mg via INTRAVENOUS
  Filled 2015-06-07: qty 1

## 2015-06-07 MED ORDER — HYDROXYZINE HCL 50 MG/ML IM SOLN: 50.0000 mg | INTRAMUSCULAR | Status: DC | PRN

## 2015-06-07 MED ORDER — ACETAMINOPHEN 650 MG RE SUPP: 650.0000 mg | RECTAL | Status: DC | PRN

## 2015-06-07 MED ORDER — LACTATED RINGERS IV SOLN
INTRAVENOUS | Status: DC | PRN
  Administered 2015-06-07 (×3): via INTRAVENOUS

## 2015-06-07 MED ORDER — LIDOCAINE-EPINEPHRINE 1 %-1:100000 IJ SOLN
INTRAMUSCULAR | Status: DC | PRN
  Administered 2015-06-07: 20 mL

## 2015-06-07 MED ORDER — PHENYLEPHRINE HCL 10 MG/ML IJ SOLN
INTRAMUSCULAR | Status: DC | PRN
  Administered 2015-06-07 (×2): 40 ug via INTRAVENOUS

## 2015-06-07 MED ORDER — PHENOL 1.4 % MT LIQD: 1.0000 | OROMUCOSAL | Status: DC | PRN

## 2015-06-07 MED ORDER — LORAZEPAM 2 MG/ML IJ SOLN: 1.0000 mg | Freq: Once | INTRAMUSCULAR | Status: DC

## 2015-06-07 MED ORDER — DOXAZOSIN MESYLATE 8 MG PO TABS
8.0000 mg | ORAL_TABLET | Freq: Every day | ORAL | Status: DC
  Administered 2015-06-07: 8 mg via ORAL
  Filled 2015-06-07 (×2): qty 1

## 2015-06-07 MED ORDER — ZOLPIDEM TARTRATE 5 MG PO TABS: 5.0000 mg | ORAL_TABLET | Freq: Every evening | ORAL | Status: DC | PRN

## 2015-06-07 MED ORDER — MAGNESIUM HYDROXIDE 400 MG/5ML PO SUSP: 30.0000 mL | Freq: Every day | ORAL | Status: DC | PRN

## 2015-06-07 MED ORDER — STERILE WATER FOR INJECTION IJ SOLN
INTRAMUSCULAR | Status: AC
  Filled 2015-06-07: qty 10

## 2015-06-07 MED ORDER — OXYCODONE HCL 5 MG PO TABS: 10.0000 mg | ORAL_TABLET | Freq: Once | ORAL | Status: DC

## 2015-06-07 MED ORDER — CYCLOBENZAPRINE HCL 10 MG PO TABS
ORAL_TABLET | ORAL | Status: AC
  Filled 2015-06-07: qty 1

## 2015-06-07 MED ORDER — SODIUM CHLORIDE 0.9 % IJ SOLN
INTRAMUSCULAR | Status: AC
  Filled 2015-06-07: qty 10

## 2015-06-07 MED ORDER — HYDROMORPHONE HCL 1 MG/ML IJ SOLN: 1.0000 mg | INTRAMUSCULAR | Status: DC | PRN

## 2015-06-07 MED ORDER — SODIUM CHLORIDE 0.9 % IR SOLN
Status: DC | PRN
  Administered 2015-06-07: 07:00:00

## 2015-06-07 MED ORDER — SUGAMMADEX SODIUM 200 MG/2ML IV SOLN
INTRAVENOUS | Status: DC | PRN
Start: 2015-06-07 — End: 2015-06-07
  Administered 2015-06-07: 175 mg via INTRAVENOUS

## 2015-06-07 MED ORDER — PROPOFOL 10 MG/ML IV BOLUS
INTRAVENOUS | Status: AC
  Filled 2015-06-07: qty 20

## 2015-06-07 MED ORDER — MIDAZOLAM HCL 5 MG/5ML IJ SOLN
INTRAMUSCULAR | Status: DC | PRN
  Administered 2015-06-07: 2 mg via INTRAVENOUS

## 2015-06-07 MED ORDER — THROMBIN 20000 UNITS EX SOLR
CUTANEOUS | Status: DC | PRN
  Administered 2015-06-07: 07:00:00 via TOPICAL

## 2015-06-07 MED ORDER — GLYCOPYRROLATE 0.2 MG/ML IJ SOLN
INTRAMUSCULAR | Status: AC
  Filled 2015-06-07: qty 1

## 2015-06-07 MED ORDER — ONDANSETRON HCL 4 MG/2ML IJ SOLN
INTRAMUSCULAR | Status: AC
  Filled 2015-06-07: qty 2

## 2015-06-07 MED ORDER — KETOROLAC TROMETHAMINE 15 MG/ML IJ SOLN
INTRAMUSCULAR | Status: AC
  Administered 2015-06-07: 15 mg
  Filled 2015-06-07: qty 1

## 2015-06-07 MED ORDER — SODIUM CHLORIDE 0.9% FLUSH: 3.0000 mL | INTRAVENOUS | Status: DC | PRN

## 2015-06-07 MED ORDER — EPHEDRINE SULFATE 50 MG/ML IJ SOLN
INTRAMUSCULAR | Status: AC
Start: 2015-06-07 — End: 2015-06-07
  Filled 2015-06-07: qty 1

## 2015-06-07 MED ORDER — HYDROMORPHONE HCL 1 MG/ML IJ SOLN
0.2500 mg | INTRAMUSCULAR | Status: DC | PRN
  Administered 2015-06-07 (×4): 0.5 mg via INTRAVENOUS

## 2015-06-07 MED ORDER — MIDAZOLAM HCL 2 MG/2ML IJ SOLN
INTRAMUSCULAR | Status: AC
  Filled 2015-06-07: qty 2

## 2015-06-07 MED ORDER — LORAZEPAM 2 MG/ML IJ SOLN
INTRAMUSCULAR | Status: AC
Start: 2015-06-07 — End: 2015-06-08
  Filled 2015-06-07: qty 1

## 2015-06-07 MED ORDER — ONDANSETRON HCL 4 MG/2ML IJ SOLN
INTRAMUSCULAR | Status: DC | PRN
  Administered 2015-06-07: 4 mg via INTRAVENOUS

## 2015-06-07 MED ORDER — ONDANSETRON HCL 4 MG PO TABS: 4.0000 mg | ORAL_TABLET | Freq: Four times a day (QID) | ORAL | Status: DC | PRN

## 2015-06-07 MED ORDER — LORAZEPAM 2 MG/ML IJ SOLN
0.5000 mg | INTRAMUSCULAR | Status: AC | PRN
  Administered 2015-06-07 (×4): 0.5 mg via INTRAVENOUS

## 2015-06-07 MED ORDER — LIDOCAINE HCL (CARDIAC) 20 MG/ML IV SOLN
INTRAVENOUS | Status: DC | PRN
  Administered 2015-06-07: 60 mg via INTRAVENOUS
  Administered 2015-06-07: 40 mg via INTRAVENOUS

## 2015-06-07 MED ORDER — VECURONIUM BROMIDE 10 MG IV SOLR
INTRAVENOUS | Status: AC
  Filled 2015-06-07: qty 10

## 2015-06-07 MED ORDER — TRAZODONE HCL 100 MG PO TABS
100.0000 mg | ORAL_TABLET | Freq: Every day | ORAL | Status: DC
  Filled 2015-06-07 (×2): qty 1

## 2015-06-07 MED ORDER — VECURONIUM BROMIDE 10 MG IV SOLR
INTRAVENOUS | Status: DC | PRN
  Administered 2015-06-07: 3 mg via INTRAVENOUS

## 2015-06-07 MED ORDER — MENTHOL 3 MG MT LOZG: 1.0000 | LOZENGE | OROMUCOSAL | Status: DC | PRN

## 2015-06-07 MED ORDER — ONDANSETRON HCL 4 MG/2ML IJ SOLN: 4.0000 mg | Freq: Four times a day (QID) | INTRAMUSCULAR | Status: DC | PRN

## 2015-06-07 MED ORDER — OXYCODONE HCL 5 MG PO TABS
5.0000 mg | ORAL_TABLET | Freq: Once | ORAL | Status: DC | PRN
  Administered 2015-06-07: 5 mg via ORAL

## 2015-06-07 MED ORDER — PROMETHAZINE HCL 25 MG/ML IJ SOLN: 6.2500 mg | INTRAMUSCULAR | Status: DC | PRN

## 2015-06-07 MED ORDER — KETOROLAC TROMETHAMINE 30 MG/ML IJ SOLN: 15.0000 mg | Freq: Once | INTRAMUSCULAR | Status: DC

## 2015-06-07 MED ORDER — ROCURONIUM BROMIDE 50 MG/5ML IV SOLN
INTRAVENOUS | Status: AC
  Filled 2015-06-07: qty 1

## 2015-06-07 MED ORDER — MORPHINE SULFATE (PF) 4 MG/ML IV SOLN: 4.0000 mg | INTRAVENOUS | Status: DC | PRN

## 2015-06-07 MED ORDER — KETOROLAC TROMETHAMINE 30 MG/ML IJ SOLN
INTRAMUSCULAR | Status: AC
  Filled 2015-06-07: qty 1

## 2015-06-07 MED ORDER — ACETAMINOPHEN 325 MG PO TABS: 650.0000 mg | ORAL_TABLET | ORAL | Status: DC | PRN

## 2015-06-07 MED ORDER — BISACODYL 10 MG RE SUPP: 10.0000 mg | Freq: Every day | RECTAL | Status: DC | PRN

## 2015-06-07 SURGICAL SUPPLY — 72 items
BAG DECANTER FOR FLEXI CONT (MISCELLANEOUS) ×2 IMPLANT
BLADE CLIPPER SURG (BLADE) ×2 IMPLANT
BRUSH SCRUB EZ PLAIN DRY (MISCELLANEOUS) ×2 IMPLANT
BUR ACRON 5.0MM COATED (BURR) ×4 IMPLANT
BUR MATCHSTICK NEURO 3.0 LAGG (BURR) ×2 IMPLANT
CANISTER SUCT 3000ML PPV (MISCELLANEOUS) ×2 IMPLANT
CAP LCK SPNE (Orthopedic Implant) ×4 IMPLANT
CAP LOCK SPINE RADIUS (Orthopedic Implant) ×4 IMPLANT
CAP LOCKING (Orthopedic Implant) ×4 IMPLANT
CONT SPEC 4OZ CLIKSEAL STRL BL (MISCELLANEOUS) ×2 IMPLANT
COVER BACK TABLE 60X90IN (DRAPES) ×2 IMPLANT
DERMABOND ADVANCED (GAUZE/BANDAGES/DRESSINGS) ×1
DERMABOND ADVANCED .7 DNX12 (GAUZE/BANDAGES/DRESSINGS) ×1 IMPLANT
DRAPE C-ARM 42X72 X-RAY (DRAPES) ×4 IMPLANT
DRAPE LAPAROTOMY 100X72X124 (DRAPES) ×2 IMPLANT
DRAPE POUCH INSTRU U-SHP 10X18 (DRAPES) ×2 IMPLANT
DRAPE PROXIMA HALF (DRAPES) ×2 IMPLANT
DRSG EMULSION OIL 3X3 NADH (GAUZE/BANDAGES/DRESSINGS) IMPLANT
ELECT REM PT RETURN 9FT ADLT (ELECTROSURGICAL) ×2
ELECTRODE REM PT RTRN 9FT ADLT (ELECTROSURGICAL) ×1 IMPLANT
GAUZE SPONGE 4X4 12PLY STRL (GAUZE/BANDAGES/DRESSINGS) ×2 IMPLANT
GAUZE SPONGE 4X4 16PLY XRAY LF (GAUZE/BANDAGES/DRESSINGS) IMPLANT
GLOVE BIOGEL PI IND STRL 8 (GLOVE) ×2 IMPLANT
GLOVE BIOGEL PI INDICATOR 8 (GLOVE) ×2
GLOVE ECLIPSE 7.5 STRL STRAW (GLOVE) ×4 IMPLANT
GLOVE EXAM NITRILE LRG STRL (GLOVE) IMPLANT
GLOVE EXAM NITRILE MD LF STRL (GLOVE) IMPLANT
GLOVE EXAM NITRILE XL STR (GLOVE) IMPLANT
GLOVE EXAM NITRILE XS STR PU (GLOVE) IMPLANT
GLOVE INDICATOR 7.0 STRL GRN (GLOVE) ×6 IMPLANT
GLOVE INDICATOR 7.5 STRL GRN (GLOVE) ×4 IMPLANT
GLOVE SURG SS PI 6.5 STRL IVOR (GLOVE) ×8 IMPLANT
GOWN STRL REUS W/ TWL LRG LVL3 (GOWN DISPOSABLE) ×1 IMPLANT
GOWN STRL REUS W/ TWL XL LVL3 (GOWN DISPOSABLE) ×3 IMPLANT
GOWN STRL REUS W/TWL 2XL LVL3 (GOWN DISPOSABLE) IMPLANT
GOWN STRL REUS W/TWL LRG LVL3 (GOWN DISPOSABLE) ×1
GOWN STRL REUS W/TWL XL LVL3 (GOWN DISPOSABLE) ×3
HEMOSTAT POWDER SURGIFOAM 1G (HEMOSTASIS) ×2 IMPLANT
KIT BASIN OR (CUSTOM PROCEDURE TRAY) ×2 IMPLANT
KIT INFUSE SMALL (Orthopedic Implant) ×2 IMPLANT
KIT ROOM TURNOVER OR (KITS) ×2 IMPLANT
MILL MEDIUM DISP (BLADE) IMPLANT
NEEDLE ASP BONE MRW 8GX15 (NEEDLE) ×2 IMPLANT
NEEDLE BONE MARROW 8GAX6 (NEEDLE) IMPLANT
NEEDLE SPNL 18GX3.5 QUINCKE PK (NEEDLE) IMPLANT
NEEDLE SPNL 22GX3.5 QUINCKE BK (NEEDLE) ×2 IMPLANT
NS IRRIG 1000ML POUR BTL (IV SOLUTION) ×2 IMPLANT
PACK LAMINECTOMY NEURO (CUSTOM PROCEDURE TRAY) ×2 IMPLANT
PAD ARMBOARD 7.5X6 YLW CONV (MISCELLANEOUS) ×6 IMPLANT
PATTIES SURGICAL .5 X.5 (GAUZE/BANDAGES/DRESSINGS) IMPLANT
PATTIES SURGICAL .5 X1 (DISPOSABLE) IMPLANT
PATTIES SURGICAL 1X1 (DISPOSABLE) IMPLANT
PEEK PLIF AVS 11X25X4 (Peek) ×4 IMPLANT
ROD RADIUS 35MM (Rod) ×4 IMPLANT
SCREW 5.75X40M (Screw) ×4 IMPLANT
SCREW 5.75X45MM (Screw) ×4 IMPLANT
SPONGE LAP 4X18 X RAY DECT (DISPOSABLE) ×2 IMPLANT
SPONGE NEURO XRAY DETECT 1X3 (DISPOSABLE) IMPLANT
SPONGE SURGIFOAM ABS GEL 100 (HEMOSTASIS) ×2 IMPLANT
STRIP BIOACTIVE VITOSS 25X100X (Neuro Prosthesis/Implant) ×2 IMPLANT
STRIP BIOACTIVE VITOSS 25X52X4 (Orthopedic Implant) ×2 IMPLANT
SUT VIC AB 1 CT1 18XBRD ANBCTR (SUTURE) ×2 IMPLANT
SUT VIC AB 1 CT1 8-18 (SUTURE) ×2
SUT VIC AB 2-0 CP2 18 (SUTURE) ×6 IMPLANT
SUT VIC AB 3-0 SH 8-18 (SUTURE) ×2 IMPLANT
SYR 3ML LL SCALE MARK (SYRINGE) ×8 IMPLANT
SYR CONTROL 10ML LL (SYRINGE) ×2 IMPLANT
TAPE CLOTH SURG 4X10 WHT LF (GAUZE/BANDAGES/DRESSINGS) ×2 IMPLANT
TOWEL OR 17X24 6PK STRL BLUE (TOWEL DISPOSABLE) ×2 IMPLANT
TOWEL OR 17X26 10 PK STRL BLUE (TOWEL DISPOSABLE) ×2 IMPLANT
TRAY FOLEY W/METER SILVER 14FR (SET/KITS/TRAYS/PACK) ×2 IMPLANT
WATER STERILE IRR 1000ML POUR (IV SOLUTION) ×2 IMPLANT

## 2015-06-07 NOTE — Anesthesia Postprocedure Evaluation (Signed)
Anesthesia Post Note  Patient: Chad [name]  Procedure(s) Performed: Procedure(s) (LRB): Lumbar four-five decompression with posterior lumbar interbody fusion with interbody prothesis posterior lateral arthrodesis and posterior nonsegmental instrumentation (N/A)  Patient location during evaluation: PACU Anesthesia Type: General Level of consciousness: awake and alert Pain management: pain level controlled Vital Signs Assessment: post-procedure vital signs reviewed and stable Respiratory status: spontaneous breathing, nonlabored ventilation, respiratory function stable and patient connected to nasal cannula oxygen Cardiovascular status: blood pressure returned to baseline and stable Postop Assessment: no signs of nausea or vomiting Anesthetic complications: no    Last Vitals:  Filed Vitals:   06/07/15 1128 06/07/15 1145  BP: 128/76 130/71  Pulse: 91 88  Temp: 37.7 C   Resp: 20 14    Last Pain:  Filed Vitals:   06/07/15 1148  PainSc: 5                  Reino Kent

## 2015-06-07 NOTE — Progress Notes (Signed)
Filed Vitals:   06/07/15 1400 06/07/15 1415 06/07/15 1430 06/07/15 1454  BP: 148/87 136/79 142/83 160/82  Pulse: 88 82 96 70  Temp:   98.9 F (37.2 C) 97.7 F (36.5 C)  TempSrc:      Resp:    18  Weight:      SpO2: 99% 96% 97% 97%    Patient complaining of pain in lateral left hip. Strength 5/5 in dorsiflexor and plantar flexor bilaterally. Dressing clean and dry. Foley to straight drainage.  Plan: Will add gabapentin 300 mg 3 times a day, and start now. We'll begin to progress to ambulation when more comfortable.   Hewitt Shorts, MD 06/07/2015, 4:54 PM

## 2015-06-07 NOTE — Op Note (Signed)
06/07/2015  11:19 AM  PATIENT:  Chad [name]  65 y.o. 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, lumbar spondylosis, lumbar degenerative disease, grade 1 dynamic L4-5 degenerative spondylolisthesis  PROCEDURE:  Procedure(s):  Bilateral L4-5 lumbar laminectomy, facetectomy, and foraminotomies, for decompression of central canal, lateral recess, and neural foraminal stenosis for decompression of the stenotic compression of the exiting L4 and L5 nerve roots, with decompression beyond that required for interbody arthrodesis; bilateral L4-5 posterior lumbar interbody arthrodesis with AVS peek interbody implants, Vitoss BA with bone marrow aspirate, and infuse; bilateral L4-5 posterior lateral arthrodesis with nonsegmental radius posterior instrumentation, Vitoss BA with bone marrow aspirate, and infuse  SURGEON:  Surgeon(s): Shirlean Kelly, MD Truitt Merle, MD  ASSISTANTS: Johnnye Lana, M.D.  ANESTHESIA:   general  EBL:  Total I/O In: 2500 [I.V.:2500] Out: 480 [Urine:280; Blood:200]  BLOOD ADMINISTERED:none  CELL SAVER GIVEN: Cell Saver technician felt that there was insufficient blood loss or process to collect the blood  COUNT: Correct per nursing staff  DICTATION: Patient is brought to the operating room placed under general endotracheal anesthesia. The patient was turned to prone position the lumbar region was prepped with Betadine soap and solution and draped in a sterile fashion. The midline was infiltrated with local anesthesia with epinephrine. A localizing x-ray was taken and then a midline incision was made carried down through the subcutaneous tissue, bipolar cautery and electrocautery were used to maintain hemostasis.  Dissection was carried down to the lumbar fascia. The fascia was incised bilaterally and the paraspinal muscles were dissected with a spinous process and lamina in a subperiosteal fashion. Another x-ray was taken for localization and the L4-5 level was localized. Dissection was then carried out laterally over the facet complex and the transverse processes of L4 and L5 were exposed and decorticated.  We then proceeded with the decompression. Bilateral L4-5 laminectomies were performed using the high-speed drill and Kerrison punches. Dissection was carried out laterally including facetectomy and foraminotomies with decompression of the stenotic compression of the exiting L4 and L5 nerve roots. Once the decompression stenotic compression of the thecal sac and exiting nerve roots was completed we proceeded with the posterior lumbar interbody arthrodesis. The annulus was incised bilaterally and the disc space entered. A thorough discectomy was performed using pituitary rongeurs and curettes. Once the discectomy was completed we began to prepare the endplate surfaces removing the cartilaginous endplates surface. We then measured the height of the intervertebral disc space. We selected 11 x 25 x 4 AVS peek interbody implants.  The C-arm fluoroscope was then draped and brought in the field and we identified the pedicle entry points bilaterally at the L4 and L5 levels. Each of the 4 pedicles was probed, we aspirated bone marrow aspirate from the vertebral bodies, this was injected over a 10 cc strip and a 5 cc strip of Vitoss BA. Then each of the pedicles was examined with the ball probe good bony surfaces were found and no bony cuts were found. Each of the pedicles was then tapped with a 5.25 mm tap, again examined with the ball probe good threading was found and no bony cuts were found. We then placed 5.75 by 45 millimeter screws bilaterally at the L4 level and 5.75 x 40 mm screws bilaterally at the L5 level.  We  then packed the AVS  peek interbody implants with Vitoss BA with bone marrow aspirate and infuse, and then placed the first implant and on the right side, carefully retracting the thecal sac and nerve root medially. We then went back to the left side and packed the midline with additional Vitoss BA with bone marrow aspirate and infuse, and then placed a second implant and on the left side again retracting the thecal sac and nerve root medially.   We then packed the lateral gutter over the transverse processes and intertransverse space with Vitoss BA with bone marrow aspirate and infuse. We then selected a 35 mm pre-lordosed rods, they were placed within the screw heads and secured with locking caps once all 4 locking caps were placed final tightening was performed against a counter torque.  The wound had been irrigated multiple times during the procedure with saline solution and bacitracin solution, good hemostasis was established with a combination of bipolar cautery, Gelfoam with thrombin, and Surgifoam. Once good hemostasis was confirmed we proceeded with closure paraspinal muscles deep fascia and Scarpa's fascia were closed with interrupted undyed 1 Vicryl sutures the subcutaneous and subcuticular closed with interrupted inverted 2-0 and 3-0 undyed Vicryl sutures the skin edges were approximated with Dermabond. The wound was dressed with sterile gauze and Hypafix.  Following surgery the patient was turned back to the supine position to be reversed and the anesthetic extubated and transferred to the recovery room for further care.   PLAN OF CARE: Admit for overnight observation  PATIENT DISPOSITION:  PACU - hemodynamically stable.   Delay start of Pharmacological VTE agent (>24hrs) due to surgical blood loss or risk of bleeding:  yes

## 2015-06-07 NOTE — Anesthesia Preprocedure Evaluation (Addendum)
Anesthesia Evaluation  Patient identified by MRN, date of birth, ID band Patient awake    Reviewed: Allergy & Precautions, H&P , NPO status , Patient's Chart, lab work & pertinent test results  History of Anesthesia Complications Negative for: history of anesthetic complications  Airway Mallampati: II  TM Distance: >3 FB Neck ROM: full    Dental  (+) Poor Dentition, Missing   Pulmonary Current Smoker,    Pulmonary exam normal breath sounds clear to auscultation       Cardiovascular hypertension, On Medications Normal cardiovascular exam Rhythm:regular Rate:Normal     Neuro/Psych  Headaches, Anxiety    GI/Hepatic Neg liver ROS, GERD  ,  Endo/Other  negative endocrine ROS  Renal/GU CRFRenal disease     Musculoskeletal   Abdominal   Peds  Hematology negative hematology ROS (+)   Anesthesia Other Findings   Reproductive/Obstetrics negative OB ROS                          Anesthesia Physical Anesthesia Plan  ASA: II  Anesthesia Plan: General   Post-op Pain Management:    Induction: Intravenous  Airway Management Planned: Oral ETT  Additional Equipment:   Intra-op Plan:   Post-operative Plan:   Informed Consent: I have reviewed the patients History and Physical, chart, labs and discussed the procedure including the risks, benefits and alternatives for the proposed anesthesia with the patient or authorized representative who has indicated his/her understanding and acceptance.   Dental Advisory Given  Plan Discussed with: Anesthesiologist, CRNA and Surgeon  Anesthesia Plan Comments: (Type and screen active, does smoke  GA with oral ETT, 1 PIV, prone position)      Anesthesia Quick Evaluation

## 2015-06-07 NOTE — Progress Notes (Signed)
Patient asleep intermittently but will awaken and moan and roll about in pain. Dr Gentry Roch at bedside, orders to give additional Oxy-ir and Ativan 1 mg IV if patient remains agitated. Patient moving all extremities without difficulty and describes pain as centralized to his lower back

## 2015-06-07 NOTE — Transfer of Care (Signed)
Immediate Anesthesia Transfer of Care Note  Patient: Chad [name]  Procedure(s) Performed: Procedure(s): Lumbar four-five decompression with posterior lumbar interbody fusion with interbody prothesis posterior lateral arthrodesis and posterior nonsegmental instrumentation (N/A)  Patient Location: PACU  Anesthesia Type:General  Level of Consciousness: awake, oriented and patient cooperative  Airway & Oxygen Therapy: Patient Spontanous Breathing and Patient connected to face mask oxygen  Post-op Assessment: Report given to RN and Post -op Vital signs reviewed and stable  Post vital signs: Reviewed  Last Vitals:  Filed Vitals:   06/07/15 0625  BP: 139/74  Pulse: 67  Temp: 36.3 C  Resp: 20    Complications: No apparent anesthesia complications

## 2015-06-07 NOTE — Anesthesia Procedure Notes (Signed)
Procedure Name: Intubation Date/Time: 06/07/2015 7:29 AM Performed by: Jenne Campus Pre-anesthesia Checklist: Patient identified, Emergency Drugs available, Suction available, Patient being monitored and Timeout performed Patient Re-evaluated:Patient Re-evaluated prior to inductionOxygen Delivery Method: Circle system utilized Preoxygenation: Pre-oxygenation with 100% oxygen Intubation Type: IV induction Ventilation: Mask ventilation without difficulty, Oral airway inserted - appropriate to patient size and Two handed mask ventilation required Laryngoscope Size: Miller and 3 Grade View: Grade I Tube type: Oral Tube size: 7.5 mm Airway Equipment and Method: Stylet and LTA kit utilized Placement Confirmation: ETT inserted through vocal cords under direct vision,  positive ETCO2,  CO2 detector and breath sounds checked- equal and bilateral Secured at: 23 cm Tube secured with: Tape Dental Injury: Teeth and Oropharynx as per pre-operative assessment

## 2015-06-07 NOTE — Progress Notes (Signed)
Patient becoming increasingly agitated, thrashing around in bed despite requests that he try to relax and not twist his back. Dr Gentry Roch at bedside, additional orders given for IV Ativan to be given in divided doses.

## 2015-06-07 NOTE — Progress Notes (Signed)
Patient with increasing pain after receiving 2 mg Dilaudid IV divided doses and oxy-ir 5 mg and Flexeril 10 mg po. Dr Gentry Roch notified, orders given for additional Dilaudid and oxy-ir.
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
## 2015-06-08 DIAGNOSIS — N189 Chronic kidney disease, unspecified: Secondary | ICD-10-CM | POA: Diagnosis present

## 2015-06-08 DIAGNOSIS — F419 Anxiety disorder, unspecified: Secondary | ICD-10-CM | POA: Diagnosis present

## 2015-06-08 DIAGNOSIS — M545 Low back pain: Secondary | ICD-10-CM | POA: Diagnosis present

## 2015-06-08 DIAGNOSIS — M4316 Spondylolisthesis, lumbar region: Secondary | ICD-10-CM | POA: Diagnosis present

## 2015-06-08 DIAGNOSIS — Z205 Contact with and (suspected) exposure to viral hepatitis: Secondary | ICD-10-CM | POA: Diagnosis present

## 2015-06-08 DIAGNOSIS — K219 Gastro-esophageal reflux disease without esophagitis: Secondary | ICD-10-CM | POA: Diagnosis present

## 2015-06-08 DIAGNOSIS — M47816 Spondylosis without myelopathy or radiculopathy, lumbar region: Secondary | ICD-10-CM | POA: Diagnosis present

## 2015-06-08 DIAGNOSIS — Z79899 Other long term (current) drug therapy: Secondary | ICD-10-CM | POA: Diagnosis not present

## 2015-06-08 DIAGNOSIS — M5136 Other intervertebral disc degeneration, lumbar region: Secondary | ICD-10-CM | POA: Diagnosis present

## 2015-06-08 DIAGNOSIS — M25552 Pain in left hip: Secondary | ICD-10-CM | POA: Diagnosis not present

## 2015-06-08 DIAGNOSIS — M4806 Spinal stenosis, lumbar region: Secondary | ICD-10-CM | POA: Diagnosis present

## 2015-06-08 DIAGNOSIS — I129 Hypertensive chronic kidney disease with stage 1 through stage 4 chronic kidney disease, or unspecified chronic kidney disease: Secondary | ICD-10-CM | POA: Diagnosis present

## 2015-06-08 MED ORDER — OXYCODONE-ACETAMINOPHEN 5-325 MG PO TABS: 1.0000 | ORAL_TABLET | ORAL | Status: DC | PRN

## 2015-06-08 MED ORDER — GABAPENTIN 300 MG PO CAPS: 300.0000 mg | ORAL_CAPSULE | Freq: Three times a day (TID) | ORAL | Status: DC

## 2015-06-08 NOTE — Discharge Instructions (Signed)

## 2015-06-08 NOTE — Progress Notes (Signed)
Patient alert and oriented, mae's well, voiding adequate amount of urine, swallowing without difficulty, c/o mild pain. Patient discharged home with family. Script and discharged instructions given to patient. Patient and family stated understanding of d/c instructions given and has an appointment with MD.   

## 2015-06-08 NOTE — Discharge Summary (Signed)
Physician Discharge Summary  Patient ID: Chad [name] MRN: [id number] DOB/AGE: 63-Feb-1952 63 y.o.  Admit date: 06/07/2015 Discharge date: 06/08/2015  Admission Diagnoses:  L4-L5 multifactorial lumbar stenosis with neurogenic claudication, lumbar spondylosis, lumbar degenerative disease, grade 1 dynamic L4-5 degenerative spondylolisthesis  Discharge Diagnoses:  L4-L5 multifactorial lumbar stenosis with neurogenic claudication, lumbar spondylosis, lumbar degenerative disease, grade 1 dynamic L4-5 degenerative spondylolisthesis Active Problems:   Lumbar stenosis with neurogenic claudication   Discharged Condition: good  Hospital Course: Patient was admitted, underwent a bilateral L4-5 lumbar decompression and arthrodesis. Postoperatively he initially had a lot of pain and lateral left hip, that is much improved this morning. He is up and ambulate actively. His wound is healing nicely; there is no erythema, swelling, or drainage. He is asking to be discharged to home. I've given him and his sister instructions regarding wound care and activities following discharge. He is scheduled to follow-up with me in the office in about 3 weeks. That day he will have it x-rayed at the office.  Discharge Exam: Blood pressure 128/60, pulse 75, temperature 97.4 F (36.3 C), temperature source Oral, resp. rate 18, weight 80.287 kg (177 lb), SpO2 97 %.  Disposition: 01-Home or Self Care     Medication List    TAKE these medications        BC FAST PAIN RELIEF ARTHRITIS 1000-65 MG Pack  Generic drug:  Aspirin-Caffeine  Take 1 packet by mouth daily as needed (pain).     cyclobenzaprine 10 MG tablet  Commonly known as:  FLEXERIL  Take 1 tablet (10 mg total) by mouth every 8 (eight) hours as needed for muscle spasms.     doxazosin 8 MG tablet  Commonly known as:  CARDURA  Take 8 mg by mouth at bedtime.     escitalopram 10 MG tablet  Commonly known as:  LEXAPRO  Take 10 mg by mouth daily.     gabapentin 300 MG capsule  Commonly known as:  NEURONTIN  Take 1 capsule (300 mg total) by mouth 3 (three) times daily.     ibuprofen 800 MG tablet  Commonly known as:  ADVIL,MOTRIN  Take 1 tablet (800 mg total) by mouth every 8 (eight) hours as needed.     oxyCODONE-acetaminophen 10-325 MG tablet  Commonly known as:  PERCOCET  Take 1 tablet by mouth every 6 (six) hours as needed for pain.     oxyCODONE-acetaminophen 5-325 MG tablet  Commonly known as:  PERCOCET/ROXICET  Take 1-2 tablets by mouth every 4 (four) hours as needed (pain).     traZODone 100 MG tablet  Commonly known as:  DESYREL  Take 100 mg by mouth at bedtime.         SignedHewitt Shorts 06/08/2015, 11:28 AM

## 2015-06-08 NOTE — Progress Notes (Signed)
Utilization review completed.  

## 2015-06-11 MED FILL — Heparin Sodium (Porcine) Inj 1000 Unit/ML: INTRAMUSCULAR | Qty: 30 | Status: AC

## 2015-06-11 MED FILL — Sodium Chloride IV Soln 0.9%: INTRAVENOUS | Qty: 1000 | Status: AC

## 2015-08-21 DIAGNOSIS — B079 Viral wart, unspecified: Secondary | ICD-10-CM | POA: Insufficient documentation

## 2017-12-11 ENCOUNTER — Other Ambulatory Visit: Payer: Self-pay

## 2017-12-11 ENCOUNTER — Encounter: Payer: Self-pay | Admitting: *Deleted

## 2017-12-11 ENCOUNTER — Emergency Department: Payer: Medicare Other

## 2017-12-11 ENCOUNTER — Emergency Department
Admission: EM | Admit: 2017-12-11 | Discharge: 2017-12-11 | Disposition: A | Discharge status: Home / Self Care | Payer: Medicare Other | Attending: Emergency Medicine | Admitting: Emergency Medicine

## 2017-12-11 DIAGNOSIS — S39012A Strain of muscle, fascia and tendon of lower back, initial encounter: Secondary | ICD-10-CM | POA: Diagnosis not present

## 2017-12-11 DIAGNOSIS — I129 Hypertensive chronic kidney disease with stage 1 through stage 4 chronic kidney disease, or unspecified chronic kidney disease: Secondary | ICD-10-CM | POA: Diagnosis not present

## 2017-12-11 DIAGNOSIS — Y990 Civilian activity done for income or pay: Secondary | ICD-10-CM | POA: Insufficient documentation

## 2017-12-11 DIAGNOSIS — Z79899 Other long term (current) drug therapy: Secondary | ICD-10-CM | POA: Diagnosis not present

## 2017-12-11 DIAGNOSIS — Y9389 Activity, other specified: Secondary | ICD-10-CM | POA: Diagnosis not present

## 2017-12-11 DIAGNOSIS — S3992XA Unspecified injury of lower back, initial encounter: Secondary | ICD-10-CM | POA: Diagnosis present

## 2017-12-11 DIAGNOSIS — M545 Low back pain, unspecified: Secondary | ICD-10-CM

## 2017-12-11 DIAGNOSIS — F17210 Nicotine dependence, cigarettes, uncomplicated: Secondary | ICD-10-CM | POA: Insufficient documentation

## 2017-12-11 DIAGNOSIS — Y929 Unspecified place or not applicable: Secondary | ICD-10-CM | POA: Insufficient documentation

## 2017-12-11 DIAGNOSIS — X500XXA Overexertion from strenuous movement or load, initial encounter: Secondary | ICD-10-CM | POA: Insufficient documentation

## 2017-12-11 DIAGNOSIS — N189 Chronic kidney disease, unspecified: Secondary | ICD-10-CM | POA: Diagnosis not present

## 2017-12-11 MED ORDER — HYDROCODONE-ACETAMINOPHEN 5-325 MG PO TABS: 1.0000 | ORAL_TABLET | Freq: Four times a day (QID) | ORAL | 0 refills | Status: DC | PRN

## 2017-12-11 MED ORDER — CYCLOBENZAPRINE HCL 10 MG PO TABS: 10.0000 mg | ORAL_TABLET | Freq: Three times a day (TID) | ORAL | 0 refills | Status: DC | PRN

## 2017-12-11 NOTE — ED Notes (Addendum)
Amy, manager of [employer], 1st attempted contact at this time. Left VM regarding WC obligations and providing a time frame and contact information to the emergency room if the company requires testing. Discussed w/ PA-C Ron the steps taken to obtain appropriate information. No profile for this company found in our records.

## 2017-12-11 NOTE — ED Triage Notes (Signed)
Pt states during the course of his usual work, he picked up a metal manhole cover for gas tanks and injured his back. Pt drove himself to ED.
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
## 2017-12-11 NOTE — ED Notes (Signed)
Contacted [facility] this time: Amy (unknown last name) cell [phone]. Burnett HarryShelly was person giving information.

## 2017-12-24 ENCOUNTER — Encounter: Payer: Self-pay | Admitting: *Deleted

## 2017-12-24 ENCOUNTER — Observation Stay: Payer: Medicare Other

## 2017-12-24 ENCOUNTER — Observation Stay
Admission: EM | Admit: 2017-12-24 | Discharge: 2017-12-25 | Disposition: A | Discharge status: Home / Self Care | Payer: Medicare Other | Attending: Family Medicine | Admitting: Family Medicine

## 2017-12-24 ENCOUNTER — Emergency Department: Payer: Medicare Other

## 2017-12-24 ENCOUNTER — Other Ambulatory Visit: Payer: Self-pay

## 2017-12-24 DIAGNOSIS — Z886 Allergy status to analgesic agent status: Secondary | ICD-10-CM | POA: Diagnosis not present

## 2017-12-24 DIAGNOSIS — G8929 Other chronic pain: Secondary | ICD-10-CM

## 2017-12-24 DIAGNOSIS — R748 Abnormal levels of other serum enzymes: Secondary | ICD-10-CM | POA: Insufficient documentation

## 2017-12-24 DIAGNOSIS — M24522 Contracture, left elbow: Secondary | ICD-10-CM | POA: Diagnosis not present

## 2017-12-24 DIAGNOSIS — F419 Anxiety disorder, unspecified: Secondary | ICD-10-CM | POA: Diagnosis not present

## 2017-12-24 DIAGNOSIS — Z79899 Other long term (current) drug therapy: Secondary | ICD-10-CM | POA: Diagnosis not present

## 2017-12-24 DIAGNOSIS — M064 Inflammatory polyarthropathy: Secondary | ICD-10-CM | POA: Diagnosis not present

## 2017-12-24 DIAGNOSIS — M5136 Other intervertebral disc degeneration, lumbar region: Secondary | ICD-10-CM | POA: Diagnosis not present

## 2017-12-24 DIAGNOSIS — M65842 Other synovitis and tenosynovitis, left hand: Secondary | ICD-10-CM | POA: Diagnosis not present

## 2017-12-24 DIAGNOSIS — M129 Arthropathy, unspecified: Secondary | ICD-10-CM | POA: Diagnosis present

## 2017-12-24 DIAGNOSIS — N189 Chronic kidney disease, unspecified: Secondary | ICD-10-CM | POA: Diagnosis not present

## 2017-12-24 DIAGNOSIS — K219 Gastro-esophageal reflux disease without esophagitis: Secondary | ICD-10-CM | POA: Diagnosis not present

## 2017-12-24 DIAGNOSIS — F17210 Nicotine dependence, cigarettes, uncomplicated: Secondary | ICD-10-CM | POA: Diagnosis not present

## 2017-12-24 DIAGNOSIS — Z885 Allergy status to narcotic agent status: Secondary | ICD-10-CM | POA: Diagnosis not present

## 2017-12-24 DIAGNOSIS — L409 Psoriasis, unspecified: Secondary | ICD-10-CM | POA: Insufficient documentation

## 2017-12-24 DIAGNOSIS — N400 Enlarged prostate without lower urinary tract symptoms: Secondary | ICD-10-CM | POA: Insufficient documentation

## 2017-12-24 DIAGNOSIS — M79671 Pain in right foot: Secondary | ICD-10-CM

## 2017-12-24 DIAGNOSIS — M79642 Pain in left hand: Secondary | ICD-10-CM

## 2017-12-24 DIAGNOSIS — I129 Hypertensive chronic kidney disease with stage 1 through stage 4 chronic kidney disease, or unspecified chronic kidney disease: Secondary | ICD-10-CM | POA: Diagnosis not present

## 2017-12-24 DIAGNOSIS — M130 Polyarthritis, unspecified: Secondary | ICD-10-CM

## 2017-12-24 DIAGNOSIS — Z981 Arthrodesis status: Secondary | ICD-10-CM | POA: Diagnosis not present

## 2017-12-24 DIAGNOSIS — M24521 Contracture, right elbow: Secondary | ICD-10-CM | POA: Insufficient documentation

## 2017-12-24 LAB — CBC WITH DIFFERENTIAL/PLATELET
Basophils Absolute: 0 10*3/uL (ref 0–0.1)
Basophils Relative: 0 %
EOS ABS: 0.1 10*3/uL (ref 0–0.7)
Eosinophils Relative: 1 %
HCT: 41.6 % (ref 40.0–52.0)
Hemoglobin: 14.2 g/dL (ref 13.0–18.0)
LYMPHS ABS: 0.8 10*3/uL — AB (ref 1.0–3.6)
Lymphocytes Relative: 6 %
MCH: 30.3 pg (ref 26.0–34.0)
MCHC: 34.2 g/dL (ref 32.0–36.0)
MCV: 88.8 fL (ref 80.0–100.0)
MONOS PCT: 11 %
Monocytes Absolute: 1.4 10*3/uL — ABNORMAL HIGH (ref 0.2–1.0)
NEUTROS PCT: 82 %
Neutro Abs: 10.8 10*3/uL — ABNORMAL HIGH (ref 1.4–6.5)
PLATELETS: 216 10*3/uL (ref 150–440)
RBC: 4.68 MIL/uL (ref 4.40–5.90)
RDW: 13.4 % (ref 11.5–14.5)
WBC: 13.1 10*3/uL — AB (ref 3.8–10.6)

## 2017-12-24 LAB — IRON AND TIBC
Iron: 16 ug/dL — ABNORMAL LOW (ref 45–182)
Saturation Ratios: 8 % — ABNORMAL LOW (ref 17.9–39.5)
TIBC: 205 ug/dL — ABNORMAL LOW (ref 250–450)
UIBC: 189 ug/dL

## 2017-12-24 LAB — COMPREHENSIVE METABOLIC PANEL
ALT: 24 U/L (ref 0–44)
AST: 23 U/L (ref 15–41)
Albumin: 3.4 g/dL — ABNORMAL LOW (ref 3.5–5.0)
Alkaline Phosphatase: 71 U/L (ref 38–126)
Anion gap: 10 (ref 5–15)
BUN: 11 mg/dL (ref 8–23)
CALCIUM: 8.9 mg/dL (ref 8.9–10.3)
CHLORIDE: 100 mmol/L (ref 98–111)
CO2: 23 mmol/L (ref 22–32)
Creatinine, Ser: 0.95 mg/dL (ref 0.61–1.24)
GFR calc non Af Amer: >60 mL/min (ref >60)
Glucose, Bld: 142 mg/dL — ABNORMAL HIGH (ref 70–99)
Potassium: 3.9 mmol/L (ref 3.5–5.1)
Sodium: 133 mmol/L — ABNORMAL LOW (ref 135–145)
Total Bilirubin: 0.7 mg/dL (ref 0.3–1.2)
Total Protein: 6.8 g/dL (ref 6.5–8.1)

## 2017-12-24 LAB — HEMOGLOBIN A1C
Hgb A1c MFr Bld: 5.1 % (ref 4.8–5.6)
Mean Plasma Glucose: 99.67 mg/dL

## 2017-12-24 LAB — FERRITIN: Ferritin: 271 ng/mL (ref 24–336)

## 2017-12-24 LAB — URIC ACID
URIC ACID, SERUM: 5.8 mg/dL (ref 3.7–8.6)
Uric Acid, Serum: 5.3 mg/dL (ref 3.7–8.6)

## 2017-12-24 LAB — TSH: TSH: 0.552 u[IU]/mL (ref 0.350–4.500)

## 2017-12-24 LAB — CK: CK TOTAL: 40 U/L — AB (ref 49–397)

## 2017-12-24 MED ORDER — DOXAZOSIN MESYLATE 8 MG PO TABS
8.0000 mg | ORAL_TABLET | Freq: Every day | ORAL | Status: DC
  Administered 2017-12-24: 8 mg via ORAL
  Filled 2017-12-24 (×2): qty 1

## 2017-12-24 MED ORDER — FAMOTIDINE IN NACL 20-0.9 MG/50ML-% IV SOLN
20.0000 mg | Freq: Two times a day (BID) | INTRAVENOUS | Status: DC
  Administered 2017-12-24: 20 mg via INTRAVENOUS
  Filled 2017-12-24: qty 50

## 2017-12-24 MED ORDER — INDOMETHACIN 50 MG PO CAPS
50.0000 mg | ORAL_CAPSULE | Freq: Three times a day (TID) | ORAL | Status: AC
  Administered 2017-12-24 – 2017-12-25 (×3): 50 mg via ORAL
  Filled 2017-12-24 (×3): qty 1

## 2017-12-24 MED ORDER — COLCHICINE 0.6 MG PO TABS
0.6000 mg | ORAL_TABLET | Freq: Two times a day (BID) | ORAL | Status: DC
  Administered 2017-12-24 – 2017-12-25 (×2): 0.6 mg via ORAL
  Filled 2017-12-24 (×2): qty 1

## 2017-12-24 MED ORDER — NICOTINE 14 MG/24HR TD PT24
14.0000 mg | MEDICATED_PATCH | Freq: Every day | TRANSDERMAL | Status: DC
  Administered 2017-12-24 – 2017-12-25 (×2): 14 mg via TRANSDERMAL
  Filled 2017-12-24 (×2): qty 1

## 2017-12-24 MED ORDER — ENOXAPARIN SODIUM 40 MG/0.4ML ~~LOC~~ SOLN
40.0000 mg | SUBCUTANEOUS | Status: DC
  Administered 2017-12-24: 40 mg via SUBCUTANEOUS
  Filled 2017-12-24: qty 0.4

## 2017-12-24 MED ORDER — ONDANSETRON HCL 4 MG PO TABS: 4.0000 mg | ORAL_TABLET | Freq: Four times a day (QID) | ORAL | Status: DC | PRN

## 2017-12-24 MED ORDER — DOCUSATE SODIUM 100 MG PO CAPS
100.0000 mg | ORAL_CAPSULE | Freq: Two times a day (BID) | ORAL | Status: DC
  Administered 2017-12-24 – 2017-12-25 (×3): 100 mg via ORAL
  Filled 2017-12-24 (×3): qty 1

## 2017-12-24 MED ORDER — COLCHICINE 0.6 MG PO TABS
1.2000 mg | ORAL_TABLET | Freq: Once | ORAL | Status: AC
  Administered 2017-12-24: 1.2 mg via ORAL
  Filled 2017-12-24: qty 2

## 2017-12-24 MED ORDER — HYDROCODONE-ACETAMINOPHEN 5-325 MG PO TABS
1.0000 | ORAL_TABLET | Freq: Four times a day (QID) | ORAL | Status: DC | PRN
  Administered 2017-12-24 – 2017-12-25 (×5): 1 via ORAL
  Filled 2017-12-24 (×5): qty 1

## 2017-12-24 MED ORDER — ACETAMINOPHEN 650 MG RE SUPP: 650.0000 mg | Freq: Four times a day (QID) | RECTAL | Status: DC | PRN

## 2017-12-24 MED ORDER — METHYLPREDNISOLONE SODIUM SUCC 125 MG IJ SOLR
125.0000 mg | Freq: Once | INTRAMUSCULAR | Status: AC
  Administered 2017-12-24: 125 mg via INTRAVENOUS
  Filled 2017-12-24: qty 2

## 2017-12-24 MED ORDER — ACETAMINOPHEN 325 MG PO TABS: 650.0000 mg | ORAL_TABLET | Freq: Four times a day (QID) | ORAL | Status: DC | PRN

## 2017-12-24 MED ORDER — KETOROLAC TROMETHAMINE 30 MG/ML IJ SOLN
15.0000 mg | Freq: Once | INTRAMUSCULAR | Status: AC
  Administered 2017-12-24: 15 mg via INTRAVENOUS
  Filled 2017-12-24: qty 1

## 2017-12-24 MED ORDER — FENTANYL CITRATE (PF) 100 MCG/2ML IJ SOLN: 50.0000 ug | Freq: Once | INTRAMUSCULAR | Status: DC

## 2017-12-24 MED ORDER — FENTANYL CITRATE (PF) 100 MCG/2ML IJ SOLN
100.0000 ug | Freq: Once | INTRAMUSCULAR | Status: AC
  Administered 2017-12-24: 100 ug via INTRAVENOUS
  Filled 2017-12-24: qty 2

## 2017-12-24 MED ORDER — COLCHICINE 0.6 MG PO TABS
1.2000 mg | ORAL_TABLET | Freq: Two times a day (BID) | ORAL | Status: DC
Start: 2017-12-24 — End: 2017-12-24
  Administered 2017-12-24: 1.2 mg via ORAL
  Filled 2017-12-24: qty 2

## 2017-12-24 MED ORDER — ONDANSETRON HCL 4 MG/2ML IJ SOLN: 4.0000 mg | Freq: Four times a day (QID) | INTRAMUSCULAR | Status: DC | PRN

## 2017-12-24 MED ORDER — ALUM & MAG HYDROXIDE-SIMETH 200-200-20 MG/5ML PO SUSP: 30.0000 mL | Freq: Four times a day (QID) | ORAL | Status: DC | PRN

## 2017-12-24 MED ORDER — CYCLOBENZAPRINE HCL 10 MG PO TABS: 10.0000 mg | ORAL_TABLET | Freq: Three times a day (TID) | ORAL | Status: DC | PRN

## 2017-12-24 NOTE — Consult Note (Signed)
Reason for Consult: Joint pain  Referring Physician: Hospitalist  Chad [name]   HPI: 67 year old white male.  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  History of lumbar disc disease.[name] He said yesterday he was working on a resume.  After that had a drink of beer.  As the evening progressed he started having pain in his neck both shoulders both hands and both wrists.  Hurt to move.  Called ambulance.  Was seen in the emergency room.  Felt to have an acute arthritis.  Admitted. White count 13,000.  Uric acid 5.8.  He is placed on Indocin and colchicine and was given IV Solu-Medrol Wrist film shows chondrocalcinosis as well as erosive and degenerative change of the left third MCP.  He is improved after analgesic and the above medicine.  No numbness or tingling. No prior history of podagra.  No family history of rheumatoid.  He does have psoriasis.  Recently worsened in his shins  PMH: Positive hepatitis C antibody with elevated liver functions.  However viral particles were negligible.  No treatment.  Hypertension psoriasis.  BPH.  Gallstones.  SURGICAL HISTORY: Lumbar disc disease surgery.  Fusion.  2017  Family History: Brother had cirrhosis on transplant list but died.  History of drug abuse  Social History: Alcohol.  Smokes.  Allergies:  Allergies  Allergen Reactions  . Morphine And Related Other (See Comments)    Causes "bad disposition"  . Ibuprofen Other (See Comments)    Gi upset    Medications:  Scheduled: . colchicine  0.6 mg Oral BID  . docusate sodium  100 mg Oral BID  . doxazosin  8 mg Oral QHS  . enoxaparin (LOVENOX) injection  40 mg Subcutaneous Q24H  . indomethacin  50 mg Oral TID WC  . nicotine  14 mg Transdermal Daily        ROS: Bilateral elbow pain.  Contractures.  He says long-standing.  Occasional swelling in his hands.  Knees have not recently bothered him.  No fever or chills.   PHYSICAL  EXAM: Blood pressure (!) 161/82, pulse (!) 58, temperature 97.6 F (36.4 C), temperature source Oral, resp. rate (!) 21, height 5\' 8"  (1.727 m), weight 73 kg, SpO2 98 %. Pleasant male.  Sclera clear.  Clear chest.  No visceromegaly.  No significant edema.  Diffuse psoriasis in both hands.  Small patch in left ear.  None of her elbows. Mild decreased range of motion of cervical spine.  Both shoulders have mild painful arc of motion no effusion.  Elbow contractures with chronic thickening.  Left wrist has synovitis.  No definite effusion.  MCPs minimally tender.  Right wrist has IV. Hips move well.  Small bilateral knee effusions.  Ankles and toes without synovitis.  Assessment: Acute inflammatory arthritis.  Etiologies include CPPD (given changes on hand films) seropositive arthritis or psoriatic arthritis.  But has MCP changes of chronic arthritis as well as bilateral elbow contractures indicating a more chronic illness.  Seems to be better on current regimen Lumbar disc disease Hypertension Abnormal liver functions unclear etiology.  Family history of cirrhosis  Recommendations: Sed rate, rheumatoid factor anti-CCP antibody, iron studies to rule out hemochromatosis  Prednisone taper. Happy to follow-up as an outpatient to decide on whether he needs long-term drugs.  The wrists were not swollen enough to aspirate or inject today  Kandyce Rud 12/24/2017, 5:22 PM

## 2017-12-24 NOTE — ED Triage Notes (Signed)
arrivd via EMS pt states yesterday his neck started hurting and it has progressed to extreme arm pain and tenderness. Pt has joint swelling pain and tightness. Tender to touch and cries out in pain. He is generally healthy and states this came on quickly. Pain 10/10

## 2017-12-24 NOTE — Care Management Obs Status (Signed)
MEDICARE OBSERVATION STATUS NOTIFICATION   Patient Details  Name: Chad [name] MRN: [id number] Date of Birth: 07/20/1950   Medicare Observation Status Notification Given:  Yes    Collie Siad, RN 12/24/2017, 9:57 AM
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
## 2017-12-25 ENCOUNTER — Other Ambulatory Visit: Payer: Self-pay

## 2017-12-25 LAB — ENA+DNA/DS+ANTICH+CENTRO+JO...
Anti JO-1: <0.2 AI (ref 0.0–0.9)
Centromere Ab Screen: <0.2 AI (ref 0.0–0.9)
Chromatin Ab SerPl-aCnc: <0.2 AI (ref 0.0–0.9)
ENA SM Ab Ser-aCnc: <0.2 AI (ref 0.0–0.9)
Ribonucleic Protein: 5.7 AI — ABNORMAL HIGH (ref 0.0–0.9)
SSA (Ro) (ENA) Antibody, IgG: <0.2 AI (ref 0.0–0.9)
SSB (La) (ENA) Antibody, IgG: <0.2 AI (ref 0.0–0.9)
Scleroderma (Scl-70) (ENA) Antibody, IgG: <0.2 AI (ref 0.0–0.9)
ds DNA Ab: <1 IU/mL (ref 0–9)

## 2017-12-25 LAB — CYCLIC CITRUL PEPTIDE ANTIBODY, IGG/IGA: CCP Antibodies IgG/IgA: 3 units (ref 0–19)

## 2017-12-25 LAB — SEDIMENTATION RATE: Sed Rate: 7 mm/hr (ref 0–20)

## 2017-12-25 LAB — ANA W/REFLEX IF POSITIVE: Anti Nuclear Antibody(ANA): POSITIVE — AB

## 2017-12-25 LAB — RHEUMATOID FACTOR: RHEUMATOID FACTOR: 17.8 [IU]/mL — AB (ref 0.0–13.9)

## 2017-12-25 MED ORDER — NICOTINE 14 MG/24HR TD PT24: 14.0000 mg | MEDICATED_PATCH | Freq: Every day | TRANSDERMAL | 0 refills | Status: DC

## 2017-12-25 MED ORDER — PREDNISONE 10 MG (21) PO TBPK: ORAL_TABLET | ORAL | 0 refills | Status: DC

## 2017-12-25 MED ORDER — GABAPENTIN 100 MG PO CAPS: 100.0000 mg | ORAL_CAPSULE | Freq: Three times a day (TID) | ORAL | 0 refills | Status: DC

## 2017-12-25 MED ORDER — COLCHICINE 0.6 MG PO TABS: 0.6000 mg | ORAL_TABLET | Freq: Every day | ORAL | 0 refills | Status: DC

## 2017-12-25 NOTE — Consult Note (Signed)
Rheumatology follow-up Major complaint is pain in both wrists.  Feels like he can make a grip better.  Neck and shoulders are a good deal better.  Not having any pain in his knees.  Sed rate normal.  Low titer rheumatoid factor.  Pending anti-CCP antibody.  Hand film showed no definite erosion but MCP narrowing with cystic changes mild degenerative change at the left third.  Ferritin negative  Exam: Afebrile.  Good range of motion of his cervical spine.  Mild painful arc of motion of both shoulders.  Less tenderness with extension of the elbows but still with contractures. mild pain with flexion extension of the wrist but no significant swelling.  MCPs nontender.  Has grip bilaterally No knee effusions  Impressions: Acute onset of inflammatory arthritis.  Polyarticular.  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  Pain out of proportion to exam findings today  Prior abnormal liver functions but negative iron studies  Recommendations: Wants to avoid prolonged nonsteroidal exposure.  With send him home with prednisone taper.  I will be glad to follow him up as an outpatient in  2 weeks.

## 2017-12-25 NOTE — Discharge Summary (Signed)
[facility] - [place] at [facility]   PATIENT NAME: Chad [name]    MR#:  [id number]  DATE OF BIRTH:  May 06, 1950  DATE OF ADMISSION:  12/24/2017 ADMITTING PHYSICIAN: Arnaldo Natal, MD  DATE OF DISCHARGE: No discharge date for patient encounter.  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 Anxiety    takes lexapro  . Chronic kidney disease    patient states he was told he has 'moderate kidney disease'  . Degenerative disc disease, lumbar   . Exposure to hepatitis C   . GERD (gastroesophageal reflux disease)    takes OTC as needed - omeprazole  . Headache    on occasion  . History of gallstones   . Hypertension    diet controlled  . Pneumonia approx 15 years ago    HOSPITAL COURSE:   *Acute bilateral wrist inflammatory arthropathy  Etiology unknown  Possible etiologies include psoriasis, rheumatoid arthritis, gout, osteoarthritis  Much improved on current regiment  Patient follow-up with Dr. Kernodle/rheumatology status post discharge for reevaluation in 2 weeks   DISCHARGE CONDITIONS:   stable  CONSULTS OBTAINED:  Treatment Team:  Kandyce Rud., MD  DRUG ALLERGIES:   Allergies  Allergen Reactions  . Morphine And Related Other (See Comments)    Causes "bad disposition"  . Ibuprofen Other (See Comments)    Gi upset    DISCHARGE MEDICATIONS:   Allergies as of 12/25/2017      Reactions   Morphine And Related Other (See Comments)   Causes "bad disposition"   Ibuprofen Other (See Comments)   Gi upset      Medication List    TAKE these medications   colchicine 0.6 MG tablet Take 1 tablet (0.6 mg total) by mouth daily.   cyclobenzaprine 10 MG tablet Commonly known as:  FLEXERIL Take 1 tablet (10 mg total) by mouth 3 (three) times daily as  needed.   doxazosin 8 MG tablet Commonly known as:  CARDURA Take 8 mg by mouth at bedtime.   escitalopram 10 MG tablet Commonly known as:  LEXAPRO TAKE 1 TABLET(10 MG) BY MOUTH EVERY DAY   gabapentin 100 MG capsule Commonly known as:  NEURONTIN Take 1 capsule (100 mg total) by mouth 3 (three) times daily.   HYDROcodone-acetaminophen 5-325 MG tablet Commonly known as:  NORCO/VICODIN Take 1 tablet by mouth every 6 (six) hours as needed for moderate pain.   nicotine 14 mg/24hr patch Commonly known as:  NICODERM CQ - dosed in mg/24 hours Place 1 patch (14 mg total) onto the skin daily. Start taking on:  12/26/2017   predniSONE 10 MG (21) Tbpk tablet Commonly known as:  STERAPRED UNI-PAK 21 TAB Take as directed   traZODone 100 MG tablet Commonly known as:  DESYREL Take 100 mg by mouth at bedtime.        DISCHARGE INSTRUCTIONS:   If you experience worsening of your admission symptoms, develop shortness of breath, life threatening emergency, suicidal or homicidal thoughts you must seek medical attention immediately by calling 911 or calling your MD immediately  if symptoms less severe.  You Must read complete instructions/literature along with all the possible adverse reactions/side effects for all the Medicines you take and that have been prescribed to you. Take any new Medicines after you have completely understood  and accept all the possible adverse reactions/side effects.   Please note  You were cared for by a hospitalist during your hospital stay. If you have any questions about your discharge medications or the care you received while you were in the hospital after you are discharged, you can call the unit and asked to speak with the hospitalist on call if the hospitalist that took care of you is not available. Once you are discharged, your primary care physician will handle any further medical issues. Please note that NO REFILLS for any discharge medications will be  authorized once you are discharged, as it is imperative that you return to your primary care physician (or establish a relationship with a primary care physician if you do not have one) for your aftercare needs so that they can reassess your need for medications and monitor your lab values.    Today   CHIEF COMPLAINT:   Chief Complaint  Patient presents with  . Arm Pain    HISTORY OF PRESENT ILLNESS:  patient with past medical history of hepatitis C, psoriasis and hypertension resents to the emergency department complaining of bilateral wrist pain.  The patient states that both forearms began to swell and become painful within the last 24 hours.  Now his wrists and knuckles are so painful that he is unable to grasp a cup.  The patient denies fevers or new rash.  X-ray of his upper extremities showed diffuse soft tissue swelling.  The patient was given a dose of colchicine in the emergency department prior to the hospital service being called for further management  VITAL SIGNS:  Blood pressure 124/79, pulse 60, temperature (!) 97.5 F (36.4 C), temperature source Oral, resp. rate 18, height 5\' 8"  (1.727 m), weight 72 kg, SpO2 97 %.  I/O:    Intake/Output Summary (Last 24 hours) at 12/25/2017 0950 Last data filed at 12/24/2017 1831 Gross per 24 hour  Intake 480 ml  Output -  Net 480 ml    PHYSICAL EXAMINATION:  GENERAL:  67 y.o.-year-old patient lying in the bed with no acute distress.  EYES: Pupils equal, round, reactive to light and accommodation. No scleral icterus. Extraocular muscles intact.  HEENT: Head atraumatic, normocephalic. Oropharynx and nasopharynx clear.  NECK:  Supple, no jugular venous distention. No thyroid enlargement, no tenderness.  LUNGS: Normal breath sounds bilaterally, no wheezing, rales,rhonchi or crepitation. No use of accessory muscles of respiration.  CARDIOVASCULAR: S1, S2 normal. No murmurs, rubs, or gallops.  ABDOMEN: Soft, non-tender, non-distended.  Bowel sounds present. No organomegaly or mass.  EXTREMITIES: No pedal edema, cyanosis, or clubbing.  NEUROLOGIC: Cranial nerves II through XII are intact. Muscle strength 5/5 in all extremities. Sensation intact. Gait not checked.  PSYCHIATRIC: The patient is alert and oriented x 3.  SKIN: No obvious rash, lesion, or ulcer.   DATA REVIEW:   CBC Recent Labs  Lab 12/24/17 0554  WBC 13.1*  HGB 14.2  HCT 41.6  PLT 216    Chemistries  Recent Labs  Lab 12/24/17 0554  NA 133*  K 3.9  CL 100  CO2 23  GLUCOSE 142*  BUN 11  CREATININE 0.95  CALCIUM 8.9  AST 23  ALT 24  ALKPHOS 71  BILITOT 0.7    Cardiac Enzymes No results for input(s): TROPONINI in the last 168 hours.  Microbiology Results  Results for orders placed or performed during the hospital encounter of 05/30/15  Surgical pcr screen     Status: Abnormal  Collection Time: 05/30/15  9:25 AM  Result Value Ref Range Status   MRSA, PCR NEGATIVE NEGATIVE Final   Staphylococcus aureus POSITIVE (A) NEGATIVE Final    Comment:        The Xpert SA Assay (FDA approved for NASAL specimens in patients over 94 years of age), is one component of a comprehensive surveillance program.  Test performance has been validated by [facility] for patients greater than or equal to 99 year old. It is not intended to diagnose infection nor to guide or monitor treatment.     RADIOLOGY:  Dg Wrist Complete Left  Result Date: 12/24/2017 CLINICAL DATA:  Initial evaluation for acute bilateral wrist pain with swelling. EXAM: LEFT WRIST - COMPLETE 3+ VIEW COMPARISON:  None. FINDINGS: No acute fracture or dislocation. Diffuse soft tissue swelling about the wrist. Extensive severe osteoarthritic changes present at the distal radioulnar articulation as well as the radiocarpal articulation. Prominent chondrocalcinosis at the triangular fibrocartilage. IMPRESSION: 1. No acute osseous abnormality identified. 2. Diffuse soft tissue swelling about  the wrist. 3. 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 Electronically Signed   By: Rise Mu M.D.   On: 12/24/2017 06:08   Dg Wrist Complete Right  Result Date: 12/24/2017 CLINICAL DATA:  Initial evaluation for acute bilateral wrist pain with effusion. EXAM: RIGHT WRIST - COMPLETE 3+ VIEW COMPARISON:  None. FINDINGS: No acute fracture or dislocation. Moderate osteoarthritic changes present at the radiocarpal and distal radioulnar articulations. Chondrocalcinosis. Soft tissue swelling present about the wrist. Osseous mineralization normal. IMPRESSION: 1. No acute osseous abnormality identified. 2. Diffuse soft tissue swelling about the wrist. 3. Moderate degenerative osteoarthritic changes about the right wrist. Electronically Signed   By: Rise Mu M.D.   On: 12/24/2017 06:04   Dg Hand 2 View Right  Result Date: 12/24/2017 CLINICAL DATA:  Bilateral hand pain.  No injury. EXAM: LEFT HAND - 2 VIEW; RIGHT HAND - 2 VIEW COMPARISON:  Bilateral wrist radiographs December 24, 2017 FINDINGS: RIGHT hand: No acute fracture deformity or dislocation. Old fifth metacarpal fracture. Faint wrist intra-articular calcifications seen with chondrocalcinosis. No destructive bony lesions. Mild dorsal wrist soft tissue swelling with intravenous catheter in place. LEFT hand: No acute fracture deformity or dislocation. Moderate third metacarpophalangeal joint space narrowing and marginal spurring compatible with osteoarthrosis. Advanced radiocarpal osteoarthrosis with cystic changes again noted. Old ulnar styloid fracture. Intra-articular calcifications about the wrist. No destructive bony lesions. Wrist soft tissue swelling better demonstrated on today's radiograph. IMPRESSION: 1. Degenerative change of the LEFT greater than RIGHT wrists better demonstrated on today's dedicated radiographs. 2. Bilateral wrist intra-articular calcifications compatible  with chondrocalcinosis with soft tissue swelling. 3. No acute fracture deformity or dislocation. Electronically Signed   By: Awilda Metro M.D.   On: 12/24/2017 14:06   Dg Hand 2 View Left  Result Date: 12/24/2017 CLINICAL DATA:  Bilateral hand pain.  No injury. EXAM: LEFT HAND - 2 VIEW; RIGHT HAND - 2 VIEW COMPARISON:  Bilateral wrist radiographs December 24, 2017 FINDINGS: RIGHT hand: No acute fracture deformity or dislocation. Old fifth metacarpal fracture. Faint wrist intra-articular calcifications seen with chondrocalcinosis. No destructive bony lesions. Mild dorsal wrist soft tissue swelling with intravenous catheter in place. LEFT hand: No acute fracture deformity or dislocation. Moderate third metacarpophalangeal joint space narrowing and marginal spurring compatible with osteoarthrosis. Advanced radiocarpal osteoarthrosis with cystic changes again noted. Old ulnar styloid fracture. Intra-articular calcifications about the wrist. No destructive bony lesions. Wrist soft tissue swelling better  demonstrated on today's radiograph. IMPRESSION: 1. Degenerative change of the LEFT greater than RIGHT wrists better demonstrated on today's dedicated radiographs. 2. Bilateral wrist intra-articular calcifications compatible with chondrocalcinosis with soft tissue swelling. 3. No acute fracture deformity or dislocation. Electronically Signed   By: Awilda Metro M.D.   On: 12/24/2017 14:06    EKG:  No orders found for this or any previous visit.    Management plans discussed with the patient, family and they are in agreement.  CODE STATUS:     Code Status Orders  (From admission, onward)         Start     Ordered   12/24/17 0804  Full code  Continuous     12/24/17 0803        Code Status History    Date Active Date Inactive Code Status Order ID Comments User Context   06/07/2015 1619 06/08/2015 1754 Full Code [id number]  Shirlean Kelly, MD Inpatient      TOTAL TIME TAKING CARE OF  THIS PATIENT: 40 minutes.    Evelena Asa Grenda Lora M.D on 12/25/2017 at 9:50 AM  Between 7am to 6pm - Pager - [phone]  After 6pm go to [email or web address] - password [email or web address]  [facility] [place] [facility]  Office  [phone]  CC: Primary care physician; Rayetta Humphrey, MD   Note: This dictation was prepared with Dragon dictation along with smaller phrase technology. Any transcriptional errors that result from this process are unintentional.

## 2017-12-25 NOTE — Progress Notes (Signed)
Patient for discharge. Patient requesting a script for Vicodin for pain. Discussed with attending. Patient is being sent on gabapentin and prednisone for pain related to his diagnosis. Script for Vicodin must be obtained from PCP per Dr. Katheren Shams if patient needs a refill. Communicated all information to patient.

## 2017-12-25 NOTE — Progress Notes (Signed)
Patient given discharge paperwork and scripts. Patient prefers to make own appts once he gets home.

## 2017-12-29 DIAGNOSIS — M19042 Primary osteoarthritis, left hand: Secondary | ICD-10-CM | POA: Insufficient documentation

## 2017-12-29 DIAGNOSIS — F331 Major depressive disorder, recurrent, moderate: Secondary | ICD-10-CM | POA: Insufficient documentation

## 2017-12-29 DIAGNOSIS — L409 Psoriasis, unspecified: Secondary | ICD-10-CM | POA: Insufficient documentation

## 2018-01-22 ENCOUNTER — Encounter: Payer: Self-pay | Admitting: Emergency Medicine

## 2018-01-22 ENCOUNTER — Emergency Department
Admission: EM | Admit: 2018-01-22 | Discharge: 2018-01-22 | Discharge status: Against Medical Advice | Payer: Medicare Other | Attending: Emergency Medicine | Admitting: Emergency Medicine

## 2018-01-22 ENCOUNTER — Other Ambulatory Visit: Payer: Self-pay

## 2018-01-22 DIAGNOSIS — M545 Low back pain: Secondary | ICD-10-CM | POA: Diagnosis not present

## 2018-01-22 DIAGNOSIS — Z5321 Procedure and treatment not carried out due to patient leaving prior to being seen by health care provider: Secondary | ICD-10-CM | POA: Diagnosis not present

## 2018-01-22 NOTE — ED Triage Notes (Signed)
Patient to ER for c/o lower back and right hip pain. Patient states he was seen here approx one month ago for injury while at work to lower back. 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 Denies any new injury.

## 2018-01-22 NOTE — ED Notes (Signed)
Pt st he is unable to wait any longer and is going to [facility]

## 2018-04-04 ENCOUNTER — Encounter: Payer: Self-pay | Admitting: Emergency Medicine

## 2018-04-04 ENCOUNTER — Emergency Department: Payer: Medicare Other

## 2018-04-04 ENCOUNTER — Emergency Department
Admission: EM | Admit: 2018-04-04 | Discharge: 2018-04-04 | Disposition: A | Discharge status: Home / Self Care | Payer: Medicare Other | Attending: Emergency Medicine | Admitting: Emergency Medicine

## 2018-04-04 DIAGNOSIS — F17210 Nicotine dependence, cigarettes, uncomplicated: Secondary | ICD-10-CM | POA: Diagnosis not present

## 2018-04-04 DIAGNOSIS — R0789 Other chest pain: Secondary | ICD-10-CM | POA: Diagnosis not present

## 2018-04-04 DIAGNOSIS — Z79899 Other long term (current) drug therapy: Secondary | ICD-10-CM | POA: Diagnosis not present

## 2018-04-04 DIAGNOSIS — G8911 Acute pain due to trauma: Secondary | ICD-10-CM

## 2018-04-04 DIAGNOSIS — R079 Chest pain, unspecified: Secondary | ICD-10-CM

## 2018-04-04 DIAGNOSIS — N183 Chronic kidney disease, stage 3 (moderate): Secondary | ICD-10-CM | POA: Diagnosis not present

## 2018-04-04 DIAGNOSIS — F322 Major depressive disorder, single episode, severe without psychotic features: Secondary | ICD-10-CM | POA: Insufficient documentation

## 2018-04-04 DIAGNOSIS — F329 Major depressive disorder, single episode, unspecified: Secondary | ICD-10-CM | POA: Diagnosis present

## 2018-04-04 DIAGNOSIS — I129 Hypertensive chronic kidney disease with stage 1 through stage 4 chronic kidney disease, or unspecified chronic kidney disease: Secondary | ICD-10-CM | POA: Insufficient documentation

## 2018-04-04 LAB — COMPREHENSIVE METABOLIC PANEL
ALT: 40 U/L (ref 0–44)
AST: 90 U/L — AB (ref 15–41)
Albumin: 3.6 g/dL (ref 3.5–5.0)
Alkaline Phosphatase: 80 U/L (ref 38–126)
Anion gap: 17 — ABNORMAL HIGH (ref 5–15)
BUN: 16 mg/dL (ref 8–23)
CO2: 20 mmol/L — AB (ref 22–32)
Calcium: 8.4 mg/dL — ABNORMAL LOW (ref 8.9–10.3)
Chloride: 98 mmol/L (ref 98–111)
Creatinine, Ser: 0.69 mg/dL (ref 0.61–1.24)
GFR calc Af Amer: >60 mL/min (ref >60)
GFR calc non Af Amer: >60 mL/min (ref >60)
Glucose, Bld: 116 mg/dL — ABNORMAL HIGH (ref 70–99)
Potassium: 4.1 mmol/L (ref 3.5–5.1)
Sodium: 135 mmol/L (ref 135–145)
Total Bilirubin: 1.5 mg/dL — ABNORMAL HIGH (ref 0.3–1.2)
Total Protein: 6.5 g/dL (ref 6.5–8.1)

## 2018-04-04 LAB — URINE DRUG SCREEN, QUALITATIVE (ARMC ONLY)
Amphetamines, Ur Screen: NOT DETECTED
Barbiturates, Ur Screen: NOT DETECTED
Benzodiazepine, Ur Scrn: NOT DETECTED
CANNABINOID 50 NG, UR ~~LOC~~: NOT DETECTED
Cocaine Metabolite,Ur ~~LOC~~: NOT DETECTED
MDMA (Ecstasy)Ur Screen: NOT DETECTED
Methadone Scn, Ur: NOT DETECTED
Opiate, Ur Screen: NOT DETECTED
Phencyclidine (PCP) Ur S: NOT DETECTED
Tricyclic, Ur Screen: NOT DETECTED

## 2018-04-04 LAB — CBC
HCT: 48.1 % (ref 39.0–52.0)
Hemoglobin: 16.5 g/dL (ref 13.0–17.0)
MCH: 30.1 pg (ref 26.0–34.0)
MCHC: 34.3 g/dL (ref 30.0–36.0)
MCV: 87.6 fL (ref 80.0–100.0)
Platelets: 160 10*3/uL (ref 150–400)
RBC: 5.49 MIL/uL (ref 4.22–5.81)
RDW: 14.1 % (ref 11.5–15.5)
WBC: 14.1 10*3/uL — ABNORMAL HIGH (ref 4.0–10.5)
nRBC: 0 % (ref 0.0–0.2)

## 2018-04-04 LAB — TROPONIN I
Troponin I: 0.05 ng/mL (ref <0.03)
Troponin I: 0.06 ng/mL (ref <0.03)

## 2018-04-04 LAB — ETHANOL: Alcohol, Ethyl (B): 219 mg/dL — ABNORMAL HIGH (ref <10)

## 2018-04-04 MED ORDER — TRAZODONE HCL 100 MG PO TABS: 100.0000 mg | ORAL_TABLET | Freq: Every day | ORAL | 0 refills | Status: DC

## 2018-04-04 MED ORDER — VITAMIN B-1 100 MG PO TABS
100.0000 mg | ORAL_TABLET | Freq: Once | ORAL | Status: AC
  Administered 2018-04-04: 100 mg via ORAL
  Filled 2018-04-04: qty 1

## 2018-04-04 MED ORDER — ASPIRIN 81 MG PO CHEW
324.0000 mg | CHEWABLE_TABLET | Freq: Once | ORAL | Status: AC
  Administered 2018-04-04: 324 mg via ORAL
  Filled 2018-04-04: qty 4

## 2018-04-04 MED ORDER — VITAMIN B-1 100 MG PO TABS: 100.0000 mg | ORAL_TABLET | Freq: Every day | ORAL | 0 refills | Status: DC

## 2018-04-04 MED ORDER — SODIUM CHLORIDE 0.9 % IV BOLUS
1000.0000 mL | Freq: Once | INTRAVENOUS | Status: AC
  Administered 2018-04-04: 1000 mL via INTRAVENOUS

## 2018-04-04 MED ORDER — NICOTINE 21 MG/24HR TD PT24
21.0000 mg | MEDICATED_PATCH | Freq: Once | TRANSDERMAL | Status: DC
  Administered 2018-04-04: 21 mg via TRANSDERMAL
  Filled 2018-04-04: qty 1

## 2018-04-04 MED ORDER — ACETAMINOPHEN 325 MG PO TABS
650.0000 mg | ORAL_TABLET | Freq: Once | ORAL | Status: AC
  Administered 2018-04-04: 650 mg via ORAL
  Filled 2018-04-04: qty 2

## 2018-04-04 MED ORDER — ESCITALOPRAM OXALATE 10 MG PO TABS: 10.0000 mg | ORAL_TABLET | Freq: Every day | ORAL | 2 refills | Status: DC

## 2018-04-04 NOTE — ED Notes (Signed)
Dr. Don PerkingVeronese made aware of critical troponin of 0.05. Orders for stat EKG

## 2018-04-04 NOTE — ED Notes (Signed)
SOC complete, recommends D/C 

## 2018-04-04 NOTE — ED Notes (Addendum)
Pt dressed out. Pt belongings bag: shirt, pants, briefs, wallet. Pt threw his own socks away.
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
## 2018-04-04 NOTE — ED Triage Notes (Signed)
First Nurse Note:  Arrives via [facility].  STates he is depressed.  Has not taken any medications for one months.  EMS state he has been drinking for the past four days.  VS wnl

## 2018-04-04 NOTE — ED Provider Notes (Signed)
No significant change in troponin over 3 hours. Patient with reproducible chest wall pain.    Jene EveryKinner, Viviane Semidey, MD 04/04/18 [phone]

## 2018-04-04 NOTE — ED Notes (Signed)
SOC called. 

## 2018-04-04 NOTE — ED Provider Notes (Signed)
Cleared for discharge by psychiatry   Jene EveryKinner, Barney Gertsch, MD 04/04/18 Chad [id number]
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
## 2018-05-17 ENCOUNTER — Emergency Department: Payer: Medicare Other

## 2018-05-17 ENCOUNTER — Inpatient Hospital Stay
Admission: EM | Admit: 2018-05-17 | Discharge: 2018-05-18 | DRG: 300 | Disposition: A | Discharge status: Home / Self Care | Payer: Medicare Other | Attending: Specialist | Admitting: Specialist

## 2018-05-17 ENCOUNTER — Encounter: Payer: Self-pay | Admitting: *Deleted

## 2018-05-17 ENCOUNTER — Other Ambulatory Visit: Payer: Self-pay

## 2018-05-17 DIAGNOSIS — F10939 Alcohol use, unspecified with withdrawal, unspecified: Secondary | ICD-10-CM | POA: Diagnosis present

## 2018-05-17 DIAGNOSIS — N189 Chronic kidney disease, unspecified: Secondary | ICD-10-CM | POA: Diagnosis not present

## 2018-05-17 DIAGNOSIS — F419 Anxiety disorder, unspecified: Secondary | ICD-10-CM | POA: Diagnosis present

## 2018-05-17 DIAGNOSIS — Z885 Allergy status to narcotic agent status: Secondary | ICD-10-CM

## 2018-05-17 DIAGNOSIS — I129 Hypertensive chronic kidney disease with stage 1 through stage 4 chronic kidney disease, or unspecified chronic kidney disease: Secondary | ICD-10-CM | POA: Diagnosis not present

## 2018-05-17 DIAGNOSIS — Z886 Allergy status to analgesic agent status: Secondary | ICD-10-CM | POA: Diagnosis not present

## 2018-05-17 DIAGNOSIS — I82412 Acute embolism and thrombosis of left femoral vein: Principal | ICD-10-CM | POA: Diagnosis present

## 2018-05-17 DIAGNOSIS — K219 Gastro-esophageal reflux disease without esophagitis: Secondary | ICD-10-CM | POA: Diagnosis not present

## 2018-05-17 DIAGNOSIS — F17210 Nicotine dependence, cigarettes, uncomplicated: Secondary | ICD-10-CM | POA: Diagnosis present

## 2018-05-17 DIAGNOSIS — B1920 Unspecified viral hepatitis C without hepatic coma: Secondary | ICD-10-CM | POA: Diagnosis present

## 2018-05-17 DIAGNOSIS — M7989 Other specified soft tissue disorders: Secondary | ICD-10-CM | POA: Diagnosis present

## 2018-05-17 DIAGNOSIS — Z79899 Other long term (current) drug therapy: Secondary | ICD-10-CM | POA: Diagnosis not present

## 2018-05-17 DIAGNOSIS — N400 Enlarged prostate without lower urinary tract symptoms: Secondary | ICD-10-CM | POA: Diagnosis not present

## 2018-05-17 DIAGNOSIS — F10239 Alcohol dependence with withdrawal, unspecified: Secondary | ICD-10-CM | POA: Diagnosis present

## 2018-05-17 DIAGNOSIS — I82402 Acute embolism and thrombosis of unspecified deep veins of left lower extremity: Secondary | ICD-10-CM

## 2018-05-17 DIAGNOSIS — I82432 Acute embolism and thrombosis of left popliteal vein: Secondary | ICD-10-CM | POA: Diagnosis present

## 2018-05-17 DIAGNOSIS — I10 Essential (primary) hypertension: Secondary | ICD-10-CM | POA: Diagnosis present

## 2018-05-17 DIAGNOSIS — F10229 Alcohol dependence with intoxication, unspecified: Secondary | ICD-10-CM | POA: Diagnosis not present

## 2018-05-17 DIAGNOSIS — Z86718 Personal history of other venous thrombosis and embolism: Secondary | ICD-10-CM | POA: Diagnosis not present

## 2018-05-17 DIAGNOSIS — F329 Major depressive disorder, single episode, unspecified: Secondary | ICD-10-CM | POA: Diagnosis present

## 2018-05-17 DIAGNOSIS — I82409 Acute embolism and thrombosis of unspecified deep veins of unspecified lower extremity: Secondary | ICD-10-CM | POA: Diagnosis present

## 2018-05-17 LAB — CBC
HCT: 43 % (ref 39.0–52.0)
Hemoglobin: 15 g/dL (ref 13.0–17.0)
MCH: 31.9 pg (ref 26.0–34.0)
MCHC: 34.9 g/dL (ref 30.0–36.0)
MCV: 91.5 fL (ref 80.0–100.0)
NRBC: 0 % (ref 0.0–0.2)
Platelets: 161 10*3/uL (ref 150–400)
RBC: 4.7 MIL/uL (ref 4.22–5.81)
RDW: 14.8 % (ref 11.5–15.5)
WBC: 9.5 10*3/uL (ref 4.0–10.5)

## 2018-05-17 LAB — COMPREHENSIVE METABOLIC PANEL
ALT: 42 U/L (ref 0–44)
AST: 75 U/L — AB (ref 15–41)
Albumin: 3.2 g/dL — ABNORMAL LOW (ref 3.5–5.0)
Alkaline Phosphatase: 104 U/L (ref 38–126)
Anion gap: 11 (ref 5–15)
BUN: 7 mg/dL — ABNORMAL LOW (ref 8–23)
CO2: 25 mmol/L (ref 22–32)
Calcium: 8.5 mg/dL — ABNORMAL LOW (ref 8.9–10.3)
Chloride: 99 mmol/L (ref 98–111)
Creatinine, Ser: 0.58 mg/dL — ABNORMAL LOW (ref 0.61–1.24)
GFR calc Af Amer: >60 mL/min (ref >60)
GFR calc non Af Amer: >60 mL/min (ref >60)
Glucose, Bld: 134 mg/dL — ABNORMAL HIGH (ref 70–99)
Potassium: 3.9 mmol/L (ref 3.5–5.1)
Sodium: 135 mmol/L (ref 135–145)
Total Bilirubin: 0.7 mg/dL (ref 0.3–1.2)
Total Protein: 6 g/dL — ABNORMAL LOW (ref 6.5–8.1)

## 2018-05-17 LAB — ETHANOL: Alcohol, Ethyl (B): 106 mg/dL — ABNORMAL HIGH (ref <10)

## 2018-05-17 MED ORDER — LORAZEPAM 2 MG/ML IJ SOLN
0.0000 mg | Freq: Four times a day (QID) | INTRAMUSCULAR | Status: DC
  Administered 2018-05-18 (×2): 1 mg via INTRAVENOUS
  Filled 2018-05-17 (×2): qty 1

## 2018-05-17 MED ORDER — TRAZODONE HCL 50 MG PO TABS
50.0000 mg | ORAL_TABLET | Freq: Every evening | ORAL | Status: DC | PRN
  Administered 2018-05-18: 50 mg via ORAL
  Filled 2018-05-17: qty 1

## 2018-05-17 MED ORDER — CHLORDIAZEPOXIDE HCL 10 MG PO CAPS
50.0000 mg | ORAL_CAPSULE | Freq: Once | ORAL | Status: AC
  Administered 2018-05-18: 50 mg via ORAL
  Filled 2018-05-17: qty 5

## 2018-05-17 MED ORDER — THIAMINE HCL 100 MG/ML IJ SOLN: 100.0000 mg | Freq: Every day | INTRAMUSCULAR | Status: DC

## 2018-05-17 MED ORDER — FOLIC ACID 1 MG PO TABS
1.0000 mg | ORAL_TABLET | Freq: Every day | ORAL | Status: DC
  Administered 2018-05-18: 1 mg via ORAL
  Filled 2018-05-17: qty 1

## 2018-05-17 MED ORDER — APIXABAN 5 MG PO TABS
10.0000 mg | ORAL_TABLET | Freq: Two times a day (BID) | ORAL | Status: DC
  Administered 2018-05-18: 10 mg via ORAL
  Filled 2018-05-17: qty 2

## 2018-05-17 MED ORDER — NICOTINE 14 MG/24HR TD PT24
14.0000 mg | MEDICATED_PATCH | Freq: Once | TRANSDERMAL | Status: AC
  Administered 2018-05-17: 14 mg via TRANSDERMAL
  Filled 2018-05-17: qty 1

## 2018-05-17 MED ORDER — LORAZEPAM 2 MG/ML IJ SOLN: 0.0000 mg | Freq: Two times a day (BID) | INTRAMUSCULAR | Status: DC

## 2018-05-17 MED ORDER — LORAZEPAM 2 MG PO TABS: 0.0000 mg | ORAL_TABLET | Freq: Two times a day (BID) | ORAL | Status: DC

## 2018-05-17 MED ORDER — CLONIDINE HCL 0.1 MG PO TABS
0.1000 mg | ORAL_TABLET | Freq: Once | ORAL | Status: AC
  Administered 2018-05-17: 0.1 mg via ORAL
  Filled 2018-05-17: qty 1

## 2018-05-17 MED ORDER — ACETAMINOPHEN 325 MG PO TABS: 650.0000 mg | ORAL_TABLET | Freq: Four times a day (QID) | ORAL | Status: DC | PRN

## 2018-05-17 MED ORDER — DOXAZOSIN MESYLATE 8 MG PO TABS
8.0000 mg | ORAL_TABLET | Freq: Every day | ORAL | Status: DC
  Administered 2018-05-18: 8 mg via ORAL
  Filled 2018-05-17 (×2): qty 1

## 2018-05-17 MED ORDER — LORAZEPAM 2 MG/ML IJ SOLN: 1.0000 mg | Freq: Four times a day (QID) | INTRAMUSCULAR | Status: DC | PRN

## 2018-05-17 MED ORDER — TRAMADOL HCL 50 MG PO TABS
50.0000 mg | ORAL_TABLET | Freq: Once | ORAL | Status: AC
  Administered 2018-05-17: 50 mg via ORAL
  Filled 2018-05-17: qty 1

## 2018-05-17 MED ORDER — APIXABAN 5 MG PO TABS
10.0000 mg | ORAL_TABLET | Freq: Once | ORAL | Status: AC
  Administered 2018-05-17: 10 mg via ORAL
  Filled 2018-05-17: qty 2

## 2018-05-17 MED ORDER — INFLUENZA VAC SPLIT HIGH-DOSE 0.5 ML IM SUSY
0.5000 mL | PREFILLED_SYRINGE | INTRAMUSCULAR | Status: DC
  Filled 2018-05-17: qty 0.5

## 2018-05-17 MED ORDER — ACETAMINOPHEN 650 MG RE SUPP: 650.0000 mg | Freq: Four times a day (QID) | RECTAL | Status: DC | PRN

## 2018-05-17 MED ORDER — ESCITALOPRAM OXALATE 10 MG PO TABS
10.0000 mg | ORAL_TABLET | Freq: Every day | ORAL | Status: DC
  Administered 2018-05-18: 10 mg via ORAL
  Filled 2018-05-17: qty 1

## 2018-05-17 MED ORDER — ADULT MULTIVITAMIN W/MINERALS CH
1.0000 | ORAL_TABLET | Freq: Every day | ORAL | Status: DC
  Administered 2018-05-18: 1 via ORAL
  Filled 2018-05-17: qty 1

## 2018-05-17 MED ORDER — OXYCODONE-ACETAMINOPHEN 5-325 MG PO TABS: 1.0000 | ORAL_TABLET | Freq: Once | ORAL | Status: DC

## 2018-05-17 MED ORDER — VITAMIN B-1 100 MG PO TABS
100.0000 mg | ORAL_TABLET | Freq: Every day | ORAL | Status: DC
  Administered 2018-05-17 – 2018-05-18 (×2): 100 mg via ORAL
  Filled 2018-05-17 (×2): qty 1

## 2018-05-17 MED ORDER — LORAZEPAM 2 MG PO TABS
0.0000 mg | ORAL_TABLET | Freq: Four times a day (QID) | ORAL | Status: DC
  Administered 2018-05-17: 1 mg via ORAL
  Filled 2018-05-17: qty 1

## 2018-05-17 MED ORDER — ONDANSETRON HCL 4 MG/2ML IJ SOLN: 4.0000 mg | Freq: Four times a day (QID) | INTRAMUSCULAR | Status: DC | PRN

## 2018-05-17 MED ORDER — LORAZEPAM 1 MG PO TABS: 1.0000 mg | ORAL_TABLET | Freq: Four times a day (QID) | ORAL | Status: DC | PRN

## 2018-05-17 MED ORDER — NICOTINE 14 MG/24HR TD PT24
14.0000 mg | MEDICATED_PATCH | Freq: Every day | TRANSDERMAL | Status: DC
  Administered 2018-05-18: 14 mg via TRANSDERMAL
  Filled 2018-05-17: qty 1

## 2018-05-17 MED ORDER — ONDANSETRON HCL 4 MG PO TABS: 4.0000 mg | ORAL_TABLET | Freq: Four times a day (QID) | ORAL | Status: DC | PRN

## 2018-05-17 MED ORDER — PNEUMOCOCCAL VAC POLYVALENT 25 MCG/0.5ML IJ INJ: 0.5000 mL | INJECTION | INTRAMUSCULAR | Status: DC

## 2018-05-17 MED ORDER — CHLORDIAZEPOXIDE HCL 25 MG PO CAPS
50.0000 mg | ORAL_CAPSULE | Freq: Once | ORAL | Status: AC
  Administered 2018-05-17: 50 mg via ORAL
  Filled 2018-05-17: qty 2

## 2018-05-17 MED ORDER — LORAZEPAM 2 MG/ML IJ SOLN: 0.0000 mg | Freq: Four times a day (QID) | INTRAMUSCULAR | Status: DC

## 2018-05-17 NOTE — ED Notes (Signed)
Hourly rounding reveals patient in room. No complaints, stable, in no acute distress. Q15 minute rounds and monitoring via Rover and Officer to continue.   

## 2018-05-17 NOTE — ED Notes (Signed)
Patient Belongings:  Pair black socks Black coat Black wallet Blue cell phone White t-shirt Blue pair of boxers Carney Bern pants Lighter (tan) [employer] Cigarettes (4 cigarettes) [employer] belt
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
## 2018-05-17 NOTE — ED Notes (Signed)
Report to include Situation, Background, Assessment, and Recommendations received from Amy RN. Patient alert and oriented, warm and dry, in no acute distress. Patient denied SI, HI, AVH and pain. Patient made aware of Q15 minute rounds and Psychologist, counselling presence for their safety. Patient instructed to come to me with needs or concerns.

## 2018-05-17 NOTE — ED Notes (Signed)
Patient c/o tremor and Alcohol withdrawal symptoms. MD notoified  Medication administered per order. No issues.

## 2018-05-17 NOTE — ED Notes (Signed)
Snack and beverage given. 

## 2018-05-17 NOTE — ED Triage Notes (Addendum)
Pt brought in via ems from home.  Pt reports feeling sad and drinking liquor today.  Pt also has swelling and redness to left lower leg.    Pt cooperative, tearful on arrival
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
## 2018-05-17 NOTE — ED Notes (Signed)
Sent red,green,purple tube to lab.

## 2018-05-18 DIAGNOSIS — I82402 Acute embolism and thrombosis of unspecified deep veins of left lower extremity: Secondary | ICD-10-CM | POA: Diagnosis not present

## 2018-05-18 DIAGNOSIS — M7989 Other specified soft tissue disorders: Secondary | ICD-10-CM | POA: Diagnosis not present

## 2018-05-18 DIAGNOSIS — F17210 Nicotine dependence, cigarettes, uncomplicated: Secondary | ICD-10-CM | POA: Diagnosis not present

## 2018-05-18 DIAGNOSIS — F10229 Alcohol dependence with intoxication, unspecified: Secondary | ICD-10-CM | POA: Diagnosis not present

## 2018-05-18 DIAGNOSIS — I82412 Acute embolism and thrombosis of left femoral vein: Secondary | ICD-10-CM | POA: Diagnosis not present

## 2018-05-18 LAB — BASIC METABOLIC PANEL
ANION GAP: 4 — AB (ref 5–15)
BUN: 9 mg/dL (ref 8–23)
CO2: 30 mmol/L (ref 22–32)
Calcium: 8 mg/dL — ABNORMAL LOW (ref 8.9–10.3)
Chloride: 101 mmol/L (ref 98–111)
Creatinine, Ser: 0.62 mg/dL (ref 0.61–1.24)
GFR calc Af Amer: >60 mL/min (ref >60)
GFR calc non Af Amer: >60 mL/min (ref >60)
Glucose, Bld: 102 mg/dL — ABNORMAL HIGH (ref 70–99)
Potassium: 4.2 mmol/L (ref 3.5–5.1)
Sodium: 135 mmol/L (ref 135–145)

## 2018-05-18 LAB — URINE DRUG SCREEN, QUALITATIVE (ARMC ONLY)
Amphetamines, Ur Screen: NOT DETECTED
Barbiturates, Ur Screen: NOT DETECTED
Benzodiazepine, Ur Scrn: POSITIVE — AB
COCAINE METABOLITE, UR ~~LOC~~: NOT DETECTED
Cannabinoid 50 Ng, Ur ~~LOC~~: NOT DETECTED
MDMA (Ecstasy)Ur Screen: NOT DETECTED
Methadone Scn, Ur: NOT DETECTED
Opiate, Ur Screen: NOT DETECTED
Phencyclidine (PCP) Ur S: NOT DETECTED
Tricyclic, Ur Screen: NOT DETECTED

## 2018-05-18 LAB — CBC
HCT: 38.3 % — ABNORMAL LOW (ref 39.0–52.0)
Hemoglobin: 12.9 g/dL — ABNORMAL LOW (ref 13.0–17.0)
MCH: 31.2 pg (ref 26.0–34.0)
MCHC: 33.7 g/dL (ref 30.0–36.0)
MCV: 92.7 fL (ref 80.0–100.0)
Platelets: 137 10*3/uL — ABNORMAL LOW (ref 150–400)
RBC: 4.13 MIL/uL — ABNORMAL LOW (ref 4.22–5.81)
RDW: 14.9 % (ref 11.5–15.5)
WBC: 7 10*3/uL (ref 4.0–10.5)
nRBC: 0 % (ref 0.0–0.2)

## 2018-05-18 MED ORDER — APIXABAN 5 MG PO TABS: ORAL_TABLET | ORAL | 1 refills | Status: DC

## 2018-05-18 MED ORDER — TRAMADOL HCL 50 MG PO TABS
50.0000 mg | ORAL_TABLET | Freq: Four times a day (QID) | ORAL | Status: DC | PRN
  Administered 2018-05-18: 50 mg via ORAL
  Filled 2018-05-18: qty 1

## 2018-05-18 MED ORDER — APIXABAN 5 MG PO TABS: 10.0000 mg | ORAL_TABLET | Freq: Two times a day (BID) | ORAL | Status: DC

## 2018-05-18 MED ORDER — CHLORDIAZEPOXIDE HCL 10 MG PO CAPS: ORAL_CAPSULE | ORAL | 0 refills | Status: DC

## 2018-05-18 MED ORDER — APIXABAN 5 MG PO TABS: 5.0000 mg | ORAL_TABLET | Freq: Two times a day (BID) | ORAL | Status: DC

## 2018-05-18 NOTE — Discharge Instructions (Signed)
Vascular Surgery Discharge Instructions  The patient was encouraged to wear graduated compression stockings (20-30 mmHg) on a daily basis.  The patient was instructed to begin wearing the stockings first thing in the morning and removing them in the evening.  The patient was instructed specifically not to sleep in the stockings.  You should be measured for the socks at a medical / surgical supply store.  In addition, behavioral modification including elevation during the day will be initiated. The patient was instructed to call the office in the interim if any worsening edema or ulcerations to the legs, feet or toes occurs.

## 2018-05-18 NOTE — Care Management (Signed)
Patient to discharge on Eliquis. Patient provided with 30 day free trail coupon
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
## 2018-05-19 LAB — HIV ANTIBODY (ROUTINE TESTING W REFLEX): HIV Screen 4th Generation wRfx: NONREACTIVE

## 2018-05-19 NOTE — Discharge Summary (Signed)
[employer] - [place] at [facility]   PATIENT NAME: Chad [name]    MR#:  [id number]  DATE OF BIRTH:  Apr 22, 1950  DATE OF ADMISSION:  05/17/2018 ADMITTING PHYSICIAN: Oralia Manisavid Willis, MD  DATE OF DISCHARGE: 05/18/2018  3:57 PM  PRIMARY CARE PHYSICIAN: Chad [name], Chad A, MD    ADMISSION DIAGNOSIS:  Alcohol withdrawal syndrome with complication (HCC) [F10.239] Alcohol dependence with intoxication with complication (HCC) [F10.229] Acute deep vein thrombosis (DVT) of left lower extremity, unspecified vein (HCC) [I82.402]  DISCHARGE DIAGNOSIS:  Principal Problem:   DVT (deep venous thrombosis) (HCC) Active Problems:   HTN (hypertension)   GERD (gastroesophageal reflux disease)   Alcohol withdrawal (HCC)   SECONDARY DIAGNOSIS:   Past Medical History:  Diagnosis Date  . Anxiety    takes lexapro  . Chronic kidney disease    patient states he was told he has 'moderate kidney disease'  . Degenerative disc disease, lumbar   . Exposure to hepatitis C   . GERD (gastroesophageal reflux disease)    takes OTC as needed - omeprazole  . Headache    on occasion  . History of gallstones   . Hypertension    diet controlled  . Pneumonia approx 15 years ago    HOSPITAL COURSE:   68 year old male with past medical history of alcohol abuse, hepatitis C, anxiety, history of gallstones, hypertension who presented to the hospital due to left leg pain and also tremors.  1.  Acute/chronic DVT-patient presented to the hospital due to left leg pain and swelling and underwent [name] ultrasound which showed [name] extensive DVT. -Vascular surgery consult was obtained.  They reviewed the patient's images and they think this is likely subacute/chronic DVT which the patient has had for [name] while but was never treated.  Patient was started on some Eliquis while in the hospital and is being discharged on that.  Patient will follow-up with vascular surgery as an outpatient.   -Vascular surgery did not  recommend thrombolysis.  2.  Alcohol abuse- patient did have some tremors and shakes secondary to alcohol withdrawal. -Patient was treated with CIWA protocol while in the hospital.  He was discharged on [name] Librium taper. Patient was also discharged on some folate, thiamine.  3.  Depression-patient will continue his Lexapro.    4.  History of BPH-patient will continue his doxazosin.  DISCHARGE CONDITIONS:   Stable.   CONSULTS OBTAINED:  Treatment Team:  Chad [name], Chad G, MD  DRUG ALLERGIES:   Allergies  Allergen Reactions  . Morphine And Related Other (See Comments)    Causes "bad disposition"  . Ibuprofen Other (See Comments)    Gi upset    DISCHARGE MEDICATIONS:   Allergies as of 05/18/2018      Reactions   Morphine And Related Other (See Comments)   Causes "bad disposition"   Ibuprofen Other (See Comments)   Gi upset      Medication List    TAKE these medications   apixaban 5 MG Tabs tablet Commonly known as:  ELIQUIS Take 10 mg PO BID X 6 days then take 5 mg BID thereafter.   chlordiazePOXIDE 10 MG capsule Commonly known as:  LIBRIUM Take 1 tab PO TID X 2 days, then take 1 tab PO BID X 2 days, then Take 1 tab PO Daily X 2 days then STOP.   doxazosin 8 MG tablet Commonly known as:  CARDURA Take 8 mg by mouth at bedtime.   escitalopram 10 MG tablet Commonly known as:  LEXAPRO Take 1 tablet (10 mg total) by mouth daily.   thiamine 100 MG tablet Commonly known as:  VITAMIN B-1 Take 1 tablet (100 mg total) by mouth daily.   traZODone 100 MG tablet Commonly known as:  DESYREL Take 1 tablet (100 mg total) by mouth at bedtime.         DISCHARGE INSTRUCTIONS:   DIET:  Regular diet  DISCHARGE CONDITION:  Stable  ACTIVITY:  Activity as tolerated  OXYGEN:  Home Oxygen: No.   Oxygen Delivery: room air  DISCHARGE LOCATION:  home   If you experience worsening of your admission symptoms, develop shortness of breath, life threatening  emergency, suicidal or homicidal thoughts you must seek medical attention immediately by calling 911 or calling your MD immediately  if symptoms less severe.  You Must read complete instructions/literature along with all the possible adverse reactions/side effects for all the Medicines you take and that have been prescribed to you. Take any new Medicines after you have completely understood and accpet all the possible adverse reactions/side effects.   Please note  You were cared for by [name] hospitalist during your hospital stay. If you have any questions about your discharge medications or the care you received while you were in the hospital after you are discharged, you can call the unit and asked to speak with the hospitalist on call if the hospitalist that took care of you is not available. Once you are discharged, your primary care physician will handle any further medical issues. Please note that NO REFILLS for any discharge medications will be authorized once you are discharged, as it is imperative that you return to your primary care physician (or establish [name] relationship with [name] primary care physician if you do not have one) for your aftercare needs so that they can reassess your need for medications and monitor your lab values.     Today   No acute events overnight, still complaining of some left leg swelling.  No chest pains, shortness of breath, nausea vomiting or any other associated symptoms.  Seen by vascular surgery and stable to be discharged on oral anticoagulants.  VITAL SIGNS:  Blood pressure 117/74, pulse 99, temperature 97.7 F (36.5 C), temperature source Oral, resp. rate 20, height 5\' 9"  (1.753 m), weight 75.8 kg, SpO2 100 %.  I/O:  No intake or output data in the 24 hours ending 05/19/18 1452  PHYSICAL EXAMINATION:  GENERAL:  68 y.o.-year-old patient lying in the bed with no acute distress.  EYES: Pupils equal, round, reactive to light and accommodation. No scleral  icterus. Extraocular muscles intact.  HEENT: Head atraumatic, normocephalic. Oropharynx and nasopharynx clear.  NECK:  Supple, no jugular venous distention. No thyroid enlargement, no tenderness.  LUNGS: Normal breath sounds bilaterally, no wheezing, rales,rhonchi. No use of accessory muscles of respiration.  CARDIOVASCULAR: S1, S2 normal. No murmurs, rubs, or gallops.  ABDOMEN: Soft, non-tender, non-distended. Bowel sounds present. No organomegaly or mass.  EXTREMITIES: No pedal edema, cyanosis, or clubbing.  Left Lower Extremity: Thigh soft.  Calf soft.  Extremity is warm distally to the toes.  Palpable pedal pulses.  Minimal edema noted.  Nontender to palpation.  There is an area of erythema noted to the front of the calf.  There is no ulceration and the skin is dry NEUROLOGIC: Cranial nerves II through XII are intact. No focal motor or sensory defecits b/l.  PSYCHIATRIC: The patient is alert and oriented x 3. SKIN: No obvious rash, lesion, or ulcer.  DATA REVIEW:   CBC Recent Labs  Lab 05/18/18 0441  WBC 7.0  HGB 12.9*  HCT 38.3*  PLT 137*    Chemistries  Recent Labs  Lab 05/17/18 1840 05/18/18 0441  NA 135 135  K 3.9 4.2  CL 99 101  CO2 25 30  GLUCOSE 134* 102*  BUN 7* 9  CREATININE 0.58* 0.62  CALCIUM 8.5* 8.0*  AST 75*  --   ALT 42  --   ALKPHOS 104  --   BILITOT 0.7  --     Cardiac Enzymes No results for input(s): TROPONINI in the last 168 hours.  Microbiology Results  Results for orders placed or performed during the hospital encounter of 05/30/15  Surgical pcr screen     Status: Abnormal   Collection Time: 05/30/15  9:25 AM  Result Value Ref Range Status   MRSA, PCR NEGATIVE NEGATIVE Final   Staphylococcus aureus POSITIVE ([name]) NEGATIVE Final    Comment:        The Xpert SA Assay (FDA approved for NASAL specimens in patients over 66 years of age), is one component of [name] comprehensive surveillance program.  Test performance has been validated by  [facility] for patients greater than or equal to 15 year old. It is not intended to diagnose infection nor to guide or monitor treatment.     RADIOLOGY:  US Venous Img Lower Unilateral Left  Result Date: 05/17/2018 CLINICAL DATA:  68 year old with redness and swelling in the left lower extremity. EXAM: LEFT LOWER EXTREMITY VENOUS DOPPLER ULTRASOUND TECHNIQUE: Gray-scale sonography with graded compression, as well as color Doppler and duplex ultrasound were performed to evaluate the lower extremity deep venous systems from the level of the common femoral vein and including the common femoral, femoral, profunda femoral, popliteal and calf veins including the posterior tibial, peroneal and gastrocnemius veins when visible. The superficial great saphenous vein was also interrogated. Spectral Doppler was utilized to evaluate flow at rest and with distal augmentation maneuvers in the common femoral, femoral and popliteal veins. COMPARISON:  05/05/2012 FINDINGS: Contralateral Common Femoral Vein: No thrombus. Normal compressibility and color Doppler flow. Phasicity in the right common femoral vein. Common Femoral Vein: Positive for thrombus. Partial compressibility of the left common femoral vein with nonocclusive thrombus. Saphenofemoral Junction: No evidence of thrombus. Normal color Doppler flow. Profunda Femoral Vein: Positive for occlusive thrombus. Femoral Vein: Positive for thrombus. Femoral vein is non compressible with occlusive thrombus. Popliteal Vein: Positive for thrombus. Popliteal vein is non compressible with occlusive thrombus. Calf Veins: Positive for thrombus. Posterior tibial veins are non compressible with thrombus. Peroneal veins are non compressible with thrombus. Other Findings:  None. IMPRESSION: Positive for deep venous thrombosis throughout the left lower extremity. Occlusive thrombus in the left calf veins, left popliteal vein, left femoral vein and left profunda femoral vein.  Nonocclusive thrombus in the left common femoral vein. Electronically Signed   By: Richarda Overlie M.D.   On: 05/17/2018 17:36      Management plans discussed with the patient, family and they are in agreement.  CODE STATUS:  Code Status History    Date Active Date Inactive Code Status Order ID Comments User Context   05/17/2018 2327 05/18/2018 1902 Full Code [id number]  Oralia Manis, MD Inpatient   TOTAL TIME TAKING CARE OF THIS PATIENT: 40 minutes.    Houston Siren M.D on 05/19/2018 at 2:52 PM  Between 7am to 6pm - Pager - [phone]  After 6pm go to [email or web address] - password  [email or web address]  [employer]  Hospitalists  Office  [phone]  CC: Primary care physician; Chad [name], Chad A, MD

## 2018-05-21 ENCOUNTER — Telehealth: Payer: Self-pay

## 2018-05-21 NOTE — Telephone Encounter (Signed)
Flagged on EMMI report for not reading discharge papers, not knowing who to call about changes in condition, and not having transportation to his follow up appointments.  Patient left message on Olegario Messier Allmond's voicemail expressing frustration over his discharge, stating we were a "total disgrace" for sending him home in the condition he was in.   First attempt to reach made, however unable to reach patient.  Left message encouraging callback.  Will attempt at later time.
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
## 2018-05-24 ENCOUNTER — Emergency Department
Admission: EM | Admit: 2018-05-24 | Discharge: 2018-05-25 | Disposition: A | Discharge status: Home / Self Care | Payer: Medicare Other | Attending: Emergency Medicine | Admitting: Emergency Medicine

## 2018-05-24 ENCOUNTER — Other Ambulatory Visit: Payer: Self-pay

## 2018-05-24 DIAGNOSIS — Z79899 Other long term (current) drug therapy: Secondary | ICD-10-CM | POA: Insufficient documentation

## 2018-05-24 DIAGNOSIS — F331 Major depressive disorder, recurrent, moderate: Secondary | ICD-10-CM | POA: Diagnosis not present

## 2018-05-24 DIAGNOSIS — N189 Chronic kidney disease, unspecified: Secondary | ICD-10-CM | POA: Insufficient documentation

## 2018-05-24 DIAGNOSIS — M79605 Pain in left leg: Secondary | ICD-10-CM | POA: Insufficient documentation

## 2018-05-24 DIAGNOSIS — I129 Hypertensive chronic kidney disease with stage 1 through stage 4 chronic kidney disease, or unspecified chronic kidney disease: Secondary | ICD-10-CM | POA: Insufficient documentation

## 2018-05-24 DIAGNOSIS — I824Y9 Acute embolism and thrombosis of unspecified deep veins of unspecified proximal lower extremity: Secondary | ICD-10-CM

## 2018-05-24 DIAGNOSIS — F17210 Nicotine dependence, cigarettes, uncomplicated: Secondary | ICD-10-CM | POA: Diagnosis not present

## 2018-05-24 DIAGNOSIS — F329 Major depressive disorder, single episode, unspecified: Secondary | ICD-10-CM | POA: Diagnosis not present

## 2018-05-24 DIAGNOSIS — Z7901 Long term (current) use of anticoagulants: Secondary | ICD-10-CM | POA: Insufficient documentation

## 2018-05-24 DIAGNOSIS — F1010 Alcohol abuse, uncomplicated: Secondary | ICD-10-CM

## 2018-05-24 LAB — CBC WITH DIFFERENTIAL/PLATELET
Abs Immature Granulocytes: 0.04 10*3/uL (ref 0.00–0.07)
Basophils Absolute: 0.1 10*3/uL (ref 0.0–0.1)
Basophils Relative: 1 %
EOS PCT: 4 %
Eosinophils Absolute: 0.3 10*3/uL (ref 0.0–0.5)
HCT: 40.9 % (ref 39.0–52.0)
Hemoglobin: 13.6 g/dL (ref 13.0–17.0)
Immature Granulocytes: 1 %
Lymphocytes Relative: 25 %
Lymphs Abs: 1.6 10*3/uL (ref 0.7–4.0)
MCH: 31.9 pg (ref 26.0–34.0)
MCHC: 33.3 g/dL (ref 30.0–36.0)
MCV: 96 fL (ref 80.0–100.0)
MONO ABS: 1 10*3/uL (ref 0.1–1.0)
MONOS PCT: 16 %
Neutro Abs: 3.4 10*3/uL (ref 1.7–7.7)
Neutrophils Relative %: 53 %
Platelets: 376 10*3/uL (ref 150–400)
RBC: 4.26 MIL/uL (ref 4.22–5.81)
RDW: 15 % (ref 11.5–15.5)
WBC: 6.4 10*3/uL (ref 4.0–10.5)
nRBC: 0 % (ref 0.0–0.2)

## 2018-05-24 LAB — COMPREHENSIVE METABOLIC PANEL
ALT: 50 U/L — ABNORMAL HIGH (ref 0–44)
AST: 94 U/L — AB (ref 15–41)
Albumin: 3.1 g/dL — ABNORMAL LOW (ref 3.5–5.0)
Alkaline Phosphatase: 105 U/L (ref 38–126)
Anion gap: 9 (ref 5–15)
BUN: 15 mg/dL (ref 8–23)
CO2: 27 mmol/L (ref 22–32)
CREATININE: 0.72 mg/dL (ref 0.61–1.24)
Calcium: 8.4 mg/dL — ABNORMAL LOW (ref 8.9–10.3)
Chloride: 102 mmol/L (ref 98–111)
GFR calc Af Amer: >60 mL/min (ref >60)
GFR calc non Af Amer: >60 mL/min (ref >60)
Glucose, Bld: 117 mg/dL — ABNORMAL HIGH (ref 70–99)
Potassium: 3.7 mmol/L (ref 3.5–5.1)
Sodium: 138 mmol/L (ref 135–145)
Total Bilirubin: 0.8 mg/dL (ref 0.3–1.2)
Total Protein: 5.8 g/dL — ABNORMAL LOW (ref 6.5–8.1)

## 2018-05-24 LAB — ETHANOL: Alcohol, Ethyl (B): 223 mg/dL — ABNORMAL HIGH (ref <10)

## 2018-05-24 LAB — SALICYLATE LEVEL: Salicylate Lvl: <7 mg/dL (ref 2.8–30.0)

## 2018-05-24 MED ORDER — VITAMIN B-1 100 MG PO TABS
100.0000 mg | ORAL_TABLET | Freq: Every day | ORAL | Status: DC
  Administered 2018-05-25: 100 mg via ORAL
  Filled 2018-05-24: qty 1

## 2018-05-24 MED ORDER — LORAZEPAM 2 MG PO TABS: 0.0000 mg | ORAL_TABLET | Freq: Two times a day (BID) | ORAL | Status: DC

## 2018-05-24 MED ORDER — LORAZEPAM 2 MG/ML IJ SOLN: 0.0000 mg | Freq: Four times a day (QID) | INTRAMUSCULAR | Status: DC

## 2018-05-24 MED ORDER — LORAZEPAM 2 MG PO TABS
0.0000 mg | ORAL_TABLET | Freq: Four times a day (QID) | ORAL | Status: DC
  Administered 2018-05-25 (×3): 1 mg via ORAL
  Filled 2018-05-24 (×3): qty 1

## 2018-05-24 MED ORDER — LORAZEPAM 2 MG/ML IJ SOLN: 0.0000 mg | Freq: Two times a day (BID) | INTRAMUSCULAR | Status: DC

## 2018-05-24 MED ORDER — THIAMINE HCL 100 MG/ML IJ SOLN: 100.0000 mg | Freq: Every day | INTRAMUSCULAR | Status: DC
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
## 2018-05-24 NOTE — ED Notes (Addendum)
Pt calling out for something to help him rest. Advised pt he needed to speak with the psychiatrist and that he had alcohol on board so we needed to be careful what we give him. Pt states that he has already asked for something and he hasn't been given anything. Asked pt what he took at home and pt stated he took nothing and he hasn't slept in 2 days. Pt drinking gingerale and lights are out per his request.

## 2018-05-24 NOTE — ED Triage Notes (Signed)
Pt to the ER for pain in the left lower leg. Pt states he has blood clots. Pt says pain started a month ago. Pt seen here a week ago for same. Pt admitted and consulted on by vascular. Pt tonight says he needs pain medication for leg and help with ETOH. Pt states he had 6 beers today. Pt is crying and says nobody cares about him and he is unable to recall any of his follow up info.

## 2018-05-25 ENCOUNTER — Other Ambulatory Visit: Payer: Self-pay | Admitting: Psychiatry

## 2018-05-25 DIAGNOSIS — F331 Major depressive disorder, recurrent, moderate: Secondary | ICD-10-CM

## 2018-05-25 DIAGNOSIS — F1010 Alcohol abuse, uncomplicated: Secondary | ICD-10-CM

## 2018-05-25 LAB — URINE DRUG SCREEN, QUALITATIVE (ARMC ONLY)
Amphetamines, Ur Screen: NOT DETECTED
Barbiturates, Ur Screen: NOT DETECTED
Benzodiazepine, Ur Scrn: POSITIVE — AB
Cannabinoid 50 Ng, Ur ~~LOC~~: NOT DETECTED
Cocaine Metabolite,Ur ~~LOC~~: NOT DETECTED
MDMA (Ecstasy)Ur Screen: NOT DETECTED
Methadone Scn, Ur: NOT DETECTED
Opiate, Ur Screen: NOT DETECTED
Phencyclidine (PCP) Ur S: NOT DETECTED
TRICYCLIC, UR SCREEN: NOT DETECTED

## 2018-05-25 MED ORDER — TRAZODONE HCL 100 MG PO TABS: 100.0000 mg | ORAL_TABLET | Freq: Every day | ORAL | Status: DC

## 2018-05-25 MED ORDER — NICOTINE 21 MG/24HR TD PT24
21.0000 mg | MEDICATED_PATCH | Freq: Once | TRANSDERMAL | Status: DC
  Administered 2018-05-25: 21 mg via TRANSDERMAL
  Filled 2018-05-25: qty 1

## 2018-05-25 MED ORDER — ESCITALOPRAM OXALATE 10 MG PO TABS
10.0000 mg | ORAL_TABLET | Freq: Every day | ORAL | Status: DC
  Administered 2018-05-25: 10 mg via ORAL
  Filled 2018-05-25: qty 1

## 2018-05-25 MED ORDER — ESCITALOPRAM OXALATE 10 MG PO TABS
20.0000 mg | ORAL_TABLET | Freq: Every day | ORAL | Status: DC
  Administered 2018-05-25: 20 mg via ORAL
  Filled 2018-05-25: qty 2

## 2018-05-25 MED ORDER — TRAZODONE HCL 100 MG PO TABS: 100.0000 mg | ORAL_TABLET | Freq: Every day | ORAL | 1 refills | Status: DC

## 2018-05-25 MED ORDER — APIXABAN 5 MG PO TABS
5.0000 mg | ORAL_TABLET | Freq: Two times a day (BID) | ORAL | Status: DC
  Administered 2018-05-25: 5 mg via ORAL
  Filled 2018-05-25: qty 1

## 2018-05-25 MED ORDER — ESCITALOPRAM OXALATE 20 MG PO TABS: 20.0000 mg | ORAL_TABLET | Freq: Every day | ORAL | 1 refills | Status: DC

## 2018-05-25 NOTE — ED Notes (Signed)
SOC in progress.  

## 2018-05-25 NOTE — ED Notes (Signed)
Walked pt to the bathroom and back to bed. Pt walked with no assistance.

## 2018-05-25 NOTE — ED Notes (Signed)
Patient has been constantly complaining about [facility] and how he was wanting to go to [facility]. This Clinical research associate has apologized  multiple times for patient not being happy.

## 2018-05-25 NOTE — ED Notes (Signed)
Pt ambulatory with a slow but steady gait. Pt states is thirsty and would like something to drink. Pt escorted back to room and given a cup of water. MD aware.

## 2018-05-25 NOTE — Progress Notes (Signed)
Patient is re-evaluated.  He reports that he has "a hard time over the holidays and has been drinking a fifth of liquor daily since Thanksgiving.  He is interested in follow-up with outpatient psychiatry and alcohol rehabilitation.  He denies SI, HI, AVH.  He has an appointment tomorrow morning for follow-up for DVT.  Patient is able to contract for safety.  Resources are provided to include [facility].  He was able to engage in safety planning including plan to return to nearest emergency room or contact emergency services if he feels unable to maintain his own safety or the safety of others. Pt had no further questions, comments, or concerns.   Mariel CraftSHEILA M Doloras [place], MD

## 2018-05-25 NOTE — ED Notes (Signed)
Pt discharged to lobby with friend. VS stable. Belongings returned to patient. Discharge paperwork reviewed with patient.  Patient signed hard copy of D/C paperwork. Pt denies SI/HI.

## 2018-05-25 NOTE — ED Notes (Signed)
Patient dressing for discharge. Maintained on 15 minute checks and observation by security camera for safety. 

## 2018-05-25 NOTE — Discharge Instructions (Addendum)
Please seek medical attention and help for any thoughts about wanting to harm yourself, harm others, any concerning change in behavior, severe depression, inappropriate drug use or any other new or concerning symptoms. ° °

## 2018-05-25 NOTE — ED Notes (Signed)
TTS spoke with [name].  [name] expressed a desire to be inpatient for Detox.  TTS informed [name] that this facility does not provide detox only services. [name] stated that he knows that he has paid into the system for 61 years and paid for this hospital and that the TTS worker was supposed to get him into [facility]. The TTS reminded [name] that his insurance is not accepted at [facility].  ([name] was here the previous week and given the same information).  [name] then informed the TTS worker that this hospital will accept and treat illegal immigrants for free and he cannot get the treatment that he deserves.  He told the TTS to call him a taxi so he could go home.  He then raised his voice at the TTS worker and stated that "You are putting me out in the streets when I have paid for you and this hospital".  TTS stated that she was sorry he felt that way, and that she would get his nurse for further information.

## 2018-05-25 NOTE — Progress Notes (Signed)
Patient is re-evaluated in the ED. He reports that he has "a hard time over the holidays and has been drinking a fifth of liquor daily since Thanksgiving.  He is interested in follow-up with outpatient psychiatry and alcohol rehabilitation.  He denies SI, HI, AVH.  He has an appointment tomorrow morning for follow-up for DVT.  Patient is able to contract for safety.  Resources are provided to include [facility].  He was able to engage in safety planning including plan to return to nearest emergency room or contact emergency services if he feels unable to maintain his own safety or the safety of others. Pt had no further questions, comments, or concerns.  Prescriptions for Lexapro 20 mg QD and Trazodone 100 mg QHS provided #30 with 1 refill each   Mariel CraftSHEILA M MAURER, MD

## 2018-05-25 NOTE — ED Notes (Signed)
Pulled IV from pt.

## 2018-05-25 NOTE — ED Notes (Signed)
Pt has made numerous phone calls on his phone attempting to find a ride home. No success at this time. Maintained on 15 minute checks and observation by security camera for safety.

## 2018-05-25 NOTE — ED Notes (Signed)
Patient currently awake and awaiting to speak with SOC.

## 2018-05-25 NOTE — ED Notes (Signed)
Walked pt to the restroom and back to bed.

## 2018-05-25 NOTE — ED Notes (Signed)
Patient currently resting with eyes closed

## 2018-05-25 NOTE — ED Notes (Signed)
VOL/SOC completed/ Plan to D/C

## 2018-05-25 NOTE — BH Assessment (Addendum)
Pt being recommended for discharge. TTS provided pt with outpatient resources.

## 2018-05-25 NOTE — ED Notes (Signed)
Per [facility] patient meets inpatient services.
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
## 2018-05-25 NOTE — ED Notes (Signed)
Per Consulting civil engineer pt will be transferred to another room in the main ED, pt resting comfortably at this time. Will continue to monitor the pt.

## 2018-05-25 NOTE — ED Notes (Signed)
Report given to SOC 

## 2018-05-25 NOTE — ED Notes (Signed)
Food tray was given with juice. 

## 2018-05-25 NOTE — ED Notes (Signed)
Called [facility] in [place] to verify current daily medications

## 2018-05-25 NOTE — ED Notes (Signed)
Gave pt his items.

## 2018-05-25 NOTE — ED Notes (Signed)
Dr. Maurer at bedside. Maintained on 15 minute checks and observation by security camera for safety. 

## 2018-05-26 NOTE — Telephone Encounter (Signed)
Patient left voicemail on 05/25/2018 stating he was returning my call.  Called this morning to let him know that the message was old and that I addressed needs on 05/21/2018.  Encouraged him once more to reach out to Patient Grievances regarding his concerns with his hospital stay if he had not already done so.

## 2018-05-28 ENCOUNTER — Telehealth: Payer: Self-pay | Admitting: Licensed Clinical Social Worker

## 2018-05-28 NOTE — Telephone Encounter (Signed)
CSW attempted to reach patient regarding an EMMI call response this afternoon however patient did not answer. CSW will make a second attempt. York Spaniel MSW,LCSW [phone]

## 2018-05-29 ENCOUNTER — Telehealth: Payer: Self-pay | Admitting: Licensed Clinical Social Worker

## 2018-05-29 NOTE — Telephone Encounter (Signed)
The CSW attempted the 2nd call to the patient for EMMI follow-up. The CSW attempted on both listed number with no success and no ability to leave a HIPPA compliant voicemail.  Argentina Ponder, MSW, Theresia Majors [phone]

## 2018-05-30 ENCOUNTER — Telehealth: Payer: Self-pay | Admitting: Licensed Clinical Social Worker

## 2018-05-30 NOTE — Telephone Encounter (Signed)
The CSW received a return call from the patient. The CSW explained the nature of the EMMI follow-up. The patient shared that he has been having symptoms of depression over the past 10 years and was provided with resources in the community at discharge for follow-up. During the call, the patient shared his concerns with his care during his inpatient admission, and he thanked the CSW for care and attention during the call. The patient denied SI/HI/self-mutilation or A/V hallucinations.   Chad [name], MSW, Theresia Majors [phone]

## 2018-06-03 ENCOUNTER — Encounter (INDEPENDENT_AMBULATORY_CARE_PROVIDER_SITE_OTHER): Payer: Medicare Other

## 2018-06-03 ENCOUNTER — Ambulatory Visit (INDEPENDENT_AMBULATORY_CARE_PROVIDER_SITE_OTHER): Payer: Medicare Other | Admitting: Vascular Surgery

## 2018-06-08 ENCOUNTER — Telehealth (INDEPENDENT_AMBULATORY_CARE_PROVIDER_SITE_OTHER): Payer: Self-pay

## 2018-06-08 NOTE — Telephone Encounter (Signed)
I spoke with the patient and provided him with the medical information and the patient stated he will keep current appointment but refuse to go to the ER

## 2018-06-08 NOTE — Telephone Encounter (Signed)
We are seeing the patient tomorrow in follow up for his lower extremity DVT. We do not treat shortness of breath in the office. With his history of DVT, I strongly recommend he seek medical attention in the ED as he may have a more serious issue.

## 2018-06-09 ENCOUNTER — Encounter (INDEPENDENT_AMBULATORY_CARE_PROVIDER_SITE_OTHER): Payer: Self-pay | Admitting: Vascular Surgery

## 2018-06-09 ENCOUNTER — Other Ambulatory Visit (INDEPENDENT_AMBULATORY_CARE_PROVIDER_SITE_OTHER): Payer: Self-pay | Admitting: Vascular Surgery

## 2018-06-09 ENCOUNTER — Ambulatory Visit (INDEPENDENT_AMBULATORY_CARE_PROVIDER_SITE_OTHER): Payer: Medicare Other | Admitting: Vascular Surgery

## 2018-06-09 ENCOUNTER — Other Ambulatory Visit: Payer: Self-pay

## 2018-06-09 ENCOUNTER — Ambulatory Visit (INDEPENDENT_AMBULATORY_CARE_PROVIDER_SITE_OTHER): Payer: Medicare Other

## 2018-06-09 VITALS — BP 142/93 | HR 106 | Resp 16 | Ht 68.0 in | Wt 170.0 lb

## 2018-06-09 DIAGNOSIS — F17210 Nicotine dependence, cigarettes, uncomplicated: Secondary | ICD-10-CM | POA: Diagnosis not present

## 2018-06-09 DIAGNOSIS — I159 Secondary hypertension, unspecified: Secondary | ICD-10-CM | POA: Diagnosis not present

## 2018-06-09 DIAGNOSIS — I82412 Acute embolism and thrombosis of left femoral vein: Secondary | ICD-10-CM

## 2018-06-09 DIAGNOSIS — I824Y2 Acute embolism and thrombosis of unspecified deep veins of left proximal lower extremity: Secondary | ICD-10-CM

## 2018-06-09 MED ORDER — HYDROCODONE-ACETAMINOPHEN 5-325 MG PO TABS: 1.0000 | ORAL_TABLET | Freq: Four times a day (QID) | ORAL | 0 refills | Status: DC | PRN
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
## 2018-06-28 ENCOUNTER — Other Ambulatory Visit (INDEPENDENT_AMBULATORY_CARE_PROVIDER_SITE_OTHER): Payer: Self-pay | Admitting: Nurse Practitioner

## 2018-06-28 ENCOUNTER — Telehealth (INDEPENDENT_AMBULATORY_CARE_PROVIDER_SITE_OTHER): Payer: Self-pay | Admitting: Vascular Surgery

## 2018-06-28 MED ORDER — HYDROCODONE-ACETAMINOPHEN 5-325 MG PO TABS: 1.0000 | ORAL_TABLET | Freq: Four times a day (QID) | ORAL | 0 refills | Status: DC | PRN

## 2018-06-28 NOTE — Telephone Encounter (Signed)
Patient called requesting refill of Hydrocodone. He is completely out. Was last seen 2/19. Patient states he still has extreme pain in his leg. Please advise. AS, CMA

## 2018-06-28 NOTE — Telephone Encounter (Signed)
Please let Chad [name] know that I have sent in a one time refill of hydrocodone to his pharmacy.

## 2018-06-28 NOTE — Telephone Encounter (Signed)
Patient is aware med was called in and that this is a one time refill. He verbalized understanding. AS, CMA

## 2018-09-08 ENCOUNTER — Encounter (INDEPENDENT_AMBULATORY_CARE_PROVIDER_SITE_OTHER): Payer: Medicare Other

## 2018-09-08 ENCOUNTER — Ambulatory Visit (INDEPENDENT_AMBULATORY_CARE_PROVIDER_SITE_OTHER): Payer: Medicare Other | Admitting: Vascular Surgery

## 2018-09-10 ENCOUNTER — Encounter (INDEPENDENT_AMBULATORY_CARE_PROVIDER_SITE_OTHER): Payer: Medicare Other

## 2018-09-10 ENCOUNTER — Ambulatory Visit (INDEPENDENT_AMBULATORY_CARE_PROVIDER_SITE_OTHER): Payer: Medicare Other | Admitting: Nurse Practitioner

## 2018-10-16 DIAGNOSIS — K7010 Alcoholic hepatitis without ascites: Secondary | ICD-10-CM | POA: Insufficient documentation

## 2018-10-16 DIAGNOSIS — E43 Unspecified severe protein-calorie malnutrition: Secondary | ICD-10-CM | POA: Insufficient documentation

## 2018-10-18 DIAGNOSIS — T17908A Unspecified foreign body in respiratory tract, part unspecified causing other injury, initial encounter: Secondary | ICD-10-CM | POA: Insufficient documentation

## 2018-10-18 DIAGNOSIS — E512 Wernicke's encephalopathy: Secondary | ICD-10-CM | POA: Insufficient documentation

## 2018-10-20 DIAGNOSIS — F05 Delirium due to known physiological condition: Secondary | ICD-10-CM | POA: Insufficient documentation

## 2018-10-20 DIAGNOSIS — J690 Pneumonitis due to inhalation of food and vomit: Secondary | ICD-10-CM | POA: Insufficient documentation

## 2019-01-14 ENCOUNTER — Emergency Department
Admission: EM | Admit: 2019-01-14 | Discharge: 2019-01-15 | Disposition: A | Discharge status: Home / Self Care | Payer: Medicare Other | Attending: Emergency Medicine | Admitting: Emergency Medicine

## 2019-01-14 ENCOUNTER — Other Ambulatory Visit: Payer: Self-pay

## 2019-01-14 ENCOUNTER — Emergency Department: Payer: Medicare Other

## 2019-01-14 DIAGNOSIS — N189 Chronic kidney disease, unspecified: Secondary | ICD-10-CM | POA: Diagnosis not present

## 2019-01-14 DIAGNOSIS — Z79899 Other long term (current) drug therapy: Secondary | ICD-10-CM | POA: Insufficient documentation

## 2019-01-14 DIAGNOSIS — Z7901 Long term (current) use of anticoagulants: Secondary | ICD-10-CM | POA: Insufficient documentation

## 2019-01-14 DIAGNOSIS — F17210 Nicotine dependence, cigarettes, uncomplicated: Secondary | ICD-10-CM | POA: Insufficient documentation

## 2019-01-14 DIAGNOSIS — R296 Repeated falls: Secondary | ICD-10-CM | POA: Insufficient documentation

## 2019-01-14 DIAGNOSIS — F10120 Alcohol abuse with intoxication, uncomplicated: Secondary | ICD-10-CM | POA: Insufficient documentation

## 2019-01-14 DIAGNOSIS — Y908 Blood alcohol level of 240 mg/100 ml or more: Secondary | ICD-10-CM | POA: Insufficient documentation

## 2019-01-14 DIAGNOSIS — F1094 Alcohol use, unspecified with alcohol-induced mood disorder: Secondary | ICD-10-CM

## 2019-01-14 DIAGNOSIS — F10920 Alcohol use, unspecified with intoxication, uncomplicated: Secondary | ICD-10-CM

## 2019-01-14 DIAGNOSIS — I129 Hypertensive chronic kidney disease with stage 1 through stage 4 chronic kidney disease, or unspecified chronic kidney disease: Secondary | ICD-10-CM | POA: Insufficient documentation

## 2019-01-14 LAB — CBC WITH DIFFERENTIAL/PLATELET
Abs Immature Granulocytes: 0.04 10*3/uL (ref 0.00–0.07)
Basophils Absolute: 0.1 10*3/uL (ref 0.0–0.1)
Basophils Relative: 1 %
Eosinophils Absolute: 0.1 10*3/uL (ref 0.0–0.5)
Eosinophils Relative: 2 %
HCT: 45 % (ref 39.0–52.0)
Hemoglobin: 15.1 g/dL (ref 13.0–17.0)
Immature Granulocytes: 1 %
Lymphocytes Relative: 31 %
Lymphs Abs: 2.4 10*3/uL (ref 0.7–4.0)
MCH: 29.6 pg (ref 26.0–34.0)
MCHC: 33.6 g/dL (ref 30.0–36.0)
MCV: 88.2 fL (ref 80.0–100.0)
Monocytes Absolute: 0.6 10*3/uL (ref 0.1–1.0)
Monocytes Relative: 8 %
Neutro Abs: 4.7 10*3/uL (ref 1.7–7.7)
Neutrophils Relative %: 57 %
Platelets: 217 10*3/uL (ref 150–400)
RBC: 5.1 MIL/uL (ref 4.22–5.81)
RDW: 14.2 % (ref 11.5–15.5)
WBC: 7.9 10*3/uL (ref 4.0–10.5)
nRBC: 0 % (ref 0.0–0.2)

## 2019-01-14 LAB — URINE DRUG SCREEN, QUALITATIVE (ARMC ONLY)
Amphetamines, Ur Screen: NOT DETECTED
Barbiturates, Ur Screen: NOT DETECTED
Benzodiazepine, Ur Scrn: POSITIVE — AB
Cannabinoid 50 Ng, Ur ~~LOC~~: NOT DETECTED
Cocaine Metabolite,Ur ~~LOC~~: NOT DETECTED
MDMA (Ecstasy)Ur Screen: NOT DETECTED
Methadone Scn, Ur: NOT DETECTED
Opiate, Ur Screen: NOT DETECTED
Phencyclidine (PCP) Ur S: NOT DETECTED
Tricyclic, Ur Screen: POSITIVE — AB

## 2019-01-14 LAB — URINALYSIS, COMPLETE (UACMP) WITH MICROSCOPIC
Bacteria, UA: NONE SEEN
Bilirubin Urine: NEGATIVE
Glucose, UA: NEGATIVE mg/dL
Hgb urine dipstick: NEGATIVE
Ketones, ur: NEGATIVE mg/dL
Leukocytes,Ua: NEGATIVE
Nitrite: NEGATIVE
Protein, ur: NEGATIVE mg/dL
Specific Gravity, Urine: 1.011 (ref 1.005–1.030)
WBC, UA: NONE SEEN WBC/hpf (ref 0–5)
pH: 5 (ref 5.0–8.0)

## 2019-01-14 LAB — ACETAMINOPHEN LEVEL: Acetaminophen (Tylenol), Serum: <10 ug/mL — ABNORMAL LOW (ref 10–30)

## 2019-01-14 LAB — LIPASE, BLOOD: Lipase: 19 U/L (ref 11–51)

## 2019-01-14 LAB — COMPREHENSIVE METABOLIC PANEL
ALT: 44 U/L (ref 0–44)
AST: 81 U/L — ABNORMAL HIGH (ref 15–41)
Albumin: 4 g/dL (ref 3.5–5.0)
Alkaline Phosphatase: 118 U/L (ref 38–126)
Anion gap: 19 — ABNORMAL HIGH (ref 5–15)
BUN: 15 mg/dL (ref 8–23)
CO2: 19 mmol/L — ABNORMAL LOW (ref 22–32)
Calcium: 8.7 mg/dL — ABNORMAL LOW (ref 8.9–10.3)
Chloride: 103 mmol/L (ref 98–111)
Creatinine, Ser: 0.79 mg/dL (ref 0.61–1.24)
GFR calc Af Amer: >60 mL/min (ref >60)
GFR calc non Af Amer: >60 mL/min (ref >60)
Glucose, Bld: 105 mg/dL — ABNORMAL HIGH (ref 70–99)
Potassium: 4 mmol/L (ref 3.5–5.1)
Sodium: 141 mmol/L (ref 135–145)
Total Bilirubin: 0.9 mg/dL (ref 0.3–1.2)
Total Protein: 7 g/dL (ref 6.5–8.1)

## 2019-01-14 LAB — ETHANOL: Alcohol, Ethyl (B): 290 mg/dL — ABNORMAL HIGH (ref <10)

## 2019-01-14 LAB — SALICYLATE LEVEL: Salicylate Lvl: <7 mg/dL (ref 2.8–30.0)

## 2019-01-14 MED ORDER — ONDANSETRON HCL 4 MG/2ML IJ SOLN
4.0000 mg | Freq: Once | INTRAMUSCULAR | Status: AC
  Administered 2019-01-14: 4 mg via INTRAVENOUS
  Filled 2019-01-14: qty 2

## 2019-01-14 MED ORDER — SODIUM CHLORIDE 0.9 % IV BOLUS
1000.0000 mL | Freq: Once | INTRAVENOUS | Status: AC
  Administered 2019-01-14: 22:00:00 1000 mL via INTRAVENOUS

## 2019-01-14 NOTE — ED Triage Notes (Signed)
Pt has anxiety and depression. Per EMS pt stated that he has not eaten in 3 days. EMS found 2 empty bottles of vodka. PD was notified. Per EMS pt can get agitated. Pt main c/o of CP and it feel stight. Pt states he started drinking at 5pm. He states he drank a bud light and a pint of vodka. Pt states he gets shakes when he goes through withdrawals.
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
## 2019-01-14 NOTE — ED Notes (Signed)
Pt has his keys in his pocket at this time. Pt aware. Pt given a coke, water and food tray. Pt was nauseas and given zofran.

## 2019-01-14 NOTE — ED Provider Notes (Signed)
-----------------------------------------   11:05 PM on 01/14/2019 -----------------------------------------  Blood pressure (!) 136/92, pulse (!) 104, resp. rate 13, height 5\' 8"  (1.727 m), weight 77 kg, SpO2 95 %.  Assuming care from Dr. Joni Fears.  In short, Chad [name] is a 68 y.o. male with a chief complaint of Alcohol Intoxication .  Refer to the original H&P for additional details.  The current plan of care is to hydrate with IVF, once patient clinically sober he may be discharged home.  Patient now clinically sober, ambulating without difficulty and no evidence of slurred speech.  Counseled patient on alcohol cessation, will assist with ride home via cab, counseled patient to follow-up with PCP.  Patient agrees with plan.    Blake Divine, MD 01/15/19 [phone]

## 2019-01-15 MED ORDER — MELATONIN 5 MG PO TABS
5.0000 mg | ORAL_TABLET | Freq: Once | ORAL | Status: AC
  Administered 2019-01-15: 5 mg via ORAL
  Filled 2019-01-15: qty 1

## 2019-01-15 MED ORDER — NICOTINE 21 MG/24HR TD PT24
21.0000 mg | MEDICATED_PATCH | Freq: Once | TRANSDERMAL | Status: DC
  Administered 2019-01-15: 21 mg via TRANSDERMAL
  Filled 2019-01-15: qty 1

## 2019-01-15 NOTE — ED Notes (Signed)
Taxi voucher given to patient, taxi cab called by staff, pt. Will be discharged when taxi arrives.

## 2019-01-15 NOTE — ED Notes (Signed)
Pt. Requesting travel voucher.   Pt. States, I am not sure where my wallet is, I don't have any way to pay for cab ride unless my wallet is at home.  Pt. Told we would arrange for taxi to come to ED to given him ride home.  Pt. Very thankful for taxi voucher.

## 2019-01-15 NOTE — ED Notes (Signed)
Pt. Observed walking to bathroom down hallway and returning to bed with steady gait.

## 2019-01-23 ENCOUNTER — Emergency Department: Payer: Medicare Other

## 2019-01-23 ENCOUNTER — Encounter: Payer: Self-pay | Admitting: Emergency Medicine

## 2019-01-23 ENCOUNTER — Other Ambulatory Visit: Payer: Self-pay

## 2019-01-23 ENCOUNTER — Observation Stay
Admission: EM | Admit: 2019-01-23 | Discharge: 2019-01-25 | Disposition: A | Discharge status: Skilled Nursing Facility | Payer: Medicare Other | Attending: Internal Medicine | Admitting: Internal Medicine

## 2019-01-23 DIAGNOSIS — F1010 Alcohol abuse, uncomplicated: Secondary | ICD-10-CM

## 2019-01-23 DIAGNOSIS — E8729 Other acidosis: Secondary | ICD-10-CM | POA: Diagnosis present

## 2019-01-23 DIAGNOSIS — R531 Weakness: Secondary | ICD-10-CM

## 2019-01-23 DIAGNOSIS — R2681 Unsteadiness on feet: Secondary | ICD-10-CM | POA: Insufficient documentation

## 2019-01-23 DIAGNOSIS — G4700 Insomnia, unspecified: Secondary | ICD-10-CM | POA: Diagnosis not present

## 2019-01-23 DIAGNOSIS — M6281 Muscle weakness (generalized): Secondary | ICD-10-CM | POA: Insufficient documentation

## 2019-01-23 DIAGNOSIS — Z885 Allergy status to narcotic agent status: Secondary | ICD-10-CM | POA: Insufficient documentation

## 2019-01-23 DIAGNOSIS — I129 Hypertensive chronic kidney disease with stage 1 through stage 4 chronic kidney disease, or unspecified chronic kidney disease: Secondary | ICD-10-CM | POA: Diagnosis not present

## 2019-01-23 DIAGNOSIS — Z886 Allergy status to analgesic agent status: Secondary | ICD-10-CM | POA: Diagnosis not present

## 2019-01-23 DIAGNOSIS — F39 Unspecified mood [affective] disorder: Secondary | ICD-10-CM | POA: Insufficient documentation

## 2019-01-23 DIAGNOSIS — F1024 Alcohol dependence with alcohol-induced mood disorder: Secondary | ICD-10-CM | POA: Diagnosis not present

## 2019-01-23 DIAGNOSIS — E872 Acidosis: Secondary | ICD-10-CM | POA: Diagnosis not present

## 2019-01-23 DIAGNOSIS — Z7901 Long term (current) use of anticoagulants: Secondary | ICD-10-CM | POA: Diagnosis not present

## 2019-01-23 DIAGNOSIS — F10239 Alcohol dependence with withdrawal, unspecified: Principal | ICD-10-CM | POA: Insufficient documentation

## 2019-01-23 DIAGNOSIS — F10939 Alcohol use, unspecified with withdrawal, unspecified: Secondary | ICD-10-CM | POA: Diagnosis present

## 2019-01-23 DIAGNOSIS — F329 Major depressive disorder, single episode, unspecified: Secondary | ICD-10-CM | POA: Diagnosis not present

## 2019-01-23 DIAGNOSIS — K219 Gastro-esophageal reflux disease without esophagitis: Secondary | ICD-10-CM | POA: Diagnosis not present

## 2019-01-23 DIAGNOSIS — Z20828 Contact with and (suspected) exposure to other viral communicable diseases: Secondary | ICD-10-CM | POA: Insufficient documentation

## 2019-01-23 DIAGNOSIS — Z86718 Personal history of other venous thrombosis and embolism: Secondary | ICD-10-CM | POA: Diagnosis not present

## 2019-01-23 DIAGNOSIS — N189 Chronic kidney disease, unspecified: Secondary | ICD-10-CM | POA: Diagnosis not present

## 2019-01-23 DIAGNOSIS — F419 Anxiety disorder, unspecified: Secondary | ICD-10-CM | POA: Diagnosis not present

## 2019-01-23 DIAGNOSIS — F10230 Alcohol dependence with withdrawal, uncomplicated: Secondary | ICD-10-CM | POA: Diagnosis present

## 2019-01-23 DIAGNOSIS — F17210 Nicotine dependence, cigarettes, uncomplicated: Secondary | ICD-10-CM | POA: Insufficient documentation

## 2019-01-23 DIAGNOSIS — Z79899 Other long term (current) drug therapy: Secondary | ICD-10-CM | POA: Insufficient documentation

## 2019-01-23 DIAGNOSIS — F1094 Alcohol use, unspecified with alcohol-induced mood disorder: Secondary | ICD-10-CM | POA: Diagnosis present

## 2019-01-23 DIAGNOSIS — E860 Dehydration: Secondary | ICD-10-CM

## 2019-01-23 DIAGNOSIS — F32A Depression, unspecified: Secondary | ICD-10-CM | POA: Diagnosis present

## 2019-01-23 DIAGNOSIS — F418 Other specified anxiety disorders: Secondary | ICD-10-CM | POA: Diagnosis present

## 2019-01-23 LAB — BASIC METABOLIC PANEL
Anion gap: 17 — ABNORMAL HIGH (ref 5–15)
BUN: 11 mg/dL (ref 8–23)
CO2: 21 mmol/L — ABNORMAL LOW (ref 22–32)
Calcium: 8.7 mg/dL — ABNORMAL LOW (ref 8.9–10.3)
Chloride: 98 mmol/L (ref 98–111)
Creatinine, Ser: 0.6 mg/dL — ABNORMAL LOW (ref 0.61–1.24)
GFR calc Af Amer: >60 mL/min (ref >60)
GFR calc non Af Amer: >60 mL/min (ref >60)
Glucose, Bld: 123 mg/dL — ABNORMAL HIGH (ref 70–99)
Potassium: 4.4 mmol/L (ref 3.5–5.1)
Sodium: 136 mmol/L (ref 135–145)

## 2019-01-23 LAB — CBC
HCT: 41.6 % (ref 39.0–52.0)
HCT: 36.1 % — ABNORMAL LOW (ref 39.0–52.0)
Hemoglobin: 14.1 g/dL (ref 13.0–17.0)
Hemoglobin: 12 g/dL — ABNORMAL LOW (ref 13.0–17.0)
MCH: 30.2 pg (ref 26.0–34.0)
MCH: 29.9 pg (ref 26.0–34.0)
MCHC: 33.9 g/dL (ref 30.0–36.0)
MCHC: 33.2 g/dL (ref 30.0–36.0)
MCV: 89.1 fL (ref 80.0–100.0)
MCV: 90 fL (ref 80.0–100.0)
Platelets: 150 10*3/uL (ref 150–400)
Platelets: 126 10*3/uL — ABNORMAL LOW (ref 150–400)
RBC: 4.67 MIL/uL (ref 4.22–5.81)
RBC: 4.01 MIL/uL — ABNORMAL LOW (ref 4.22–5.81)
RDW: 15.3 % (ref 11.5–15.5)
RDW: 15.3 % (ref 11.5–15.5)
WBC: 7 10*3/uL (ref 4.0–10.5)
WBC: 5.8 10*3/uL (ref 4.0–10.5)
nRBC: 0 % (ref 0.0–0.2)
nRBC: 0 % (ref 0.0–0.2)

## 2019-01-23 LAB — LACTIC ACID, PLASMA
Lactic Acid, Venous: 5 mmol/L (ref 0.5–1.9)
Lactic Acid, Venous: 4 mmol/L (ref 0.5–1.9)
Lactic Acid, Venous: 4.3 mmol/L (ref 0.5–1.9)
Lactic Acid, Venous: 1.8 mmol/L (ref 0.5–1.9)
Lactic Acid, Venous: 1.2 mmol/L (ref 0.5–1.9)

## 2019-01-23 LAB — ETHANOL: Alcohol, Ethyl (B): 222 mg/dL — ABNORMAL HIGH (ref <10)

## 2019-01-23 LAB — URINALYSIS, COMPLETE (UACMP) WITH MICROSCOPIC
Bacteria, UA: NONE SEEN
Bilirubin Urine: NEGATIVE
Glucose, UA: NEGATIVE mg/dL
Hgb urine dipstick: NEGATIVE
Ketones, ur: NEGATIVE mg/dL
Leukocytes,Ua: NEGATIVE
Nitrite: NEGATIVE
Protein, ur: NEGATIVE mg/dL
Specific Gravity, Urine: 1.012 (ref 1.005–1.030)
Squamous Epithelial / HPF: NONE SEEN (ref 0–5)
pH: 5 (ref 5.0–8.0)

## 2019-01-23 LAB — HEPATIC FUNCTION PANEL
ALT: 35 U/L (ref 0–44)
AST: 81 U/L — ABNORMAL HIGH (ref 15–41)
Albumin: 3.6 g/dL (ref 3.5–5.0)
Alkaline Phosphatase: 129 U/L — ABNORMAL HIGH (ref 38–126)
Bilirubin, Direct: 0.3 mg/dL — ABNORMAL HIGH (ref 0.0–0.2)
Indirect Bilirubin: 0.8 mg/dL (ref 0.3–0.9)
Total Bilirubin: 1.1 mg/dL (ref 0.3–1.2)
Total Protein: 6.4 g/dL — ABNORMAL LOW (ref 6.5–8.1)

## 2019-01-23 LAB — TROPONIN I (HIGH SENSITIVITY)
Troponin I (High Sensitivity): 8 ng/L (ref <18)
Troponin I (High Sensitivity): 7 ng/L

## 2019-01-23 LAB — BETA-HYDROXYBUTYRIC ACID: Beta-Hydroxybutyric Acid: 0.18 mmol/L (ref 0.05–0.27)

## 2019-01-23 LAB — PHOSPHORUS: Phosphorus: 3.4 mg/dL (ref 2.5–4.6)

## 2019-01-23 LAB — SARS CORONAVIRUS 2 BY RT PCR (HOSPITAL ORDER, PERFORMED IN ~~LOC~~ HOSPITAL LAB): SARS Coronavirus 2: NEGATIVE

## 2019-01-23 LAB — LIPASE, BLOOD: Lipase: 22 U/L (ref 11–51)

## 2019-01-23 LAB — MAGNESIUM: Magnesium: 1.7 mg/dL (ref 1.7–2.4)

## 2019-01-23 MED ORDER — DOXAZOSIN MESYLATE 8 MG PO TABS
8.0000 mg | ORAL_TABLET | Freq: Every day | ORAL | Status: DC
  Administered 2019-01-23 – 2019-01-24 (×2): 8 mg via ORAL
  Filled 2019-01-23 (×3): qty 1

## 2019-01-23 MED ORDER — LORAZEPAM 1 MG PO TABS: 1.0000 mg | ORAL_TABLET | Freq: Two times a day (BID) | ORAL | Status: DC

## 2019-01-23 MED ORDER — VITAMIN B-1 100 MG PO TABS
100.0000 mg | ORAL_TABLET | Freq: Every day | ORAL | Status: DC
  Administered 2019-01-23 – 2019-01-24 (×2): 100 mg via ORAL
  Filled 2019-01-23 (×3): qty 1

## 2019-01-23 MED ORDER — ACETAMINOPHEN 650 MG RE SUPP: 650.0000 mg | Freq: Four times a day (QID) | RECTAL | Status: DC | PRN

## 2019-01-23 MED ORDER — LORAZEPAM 1 MG PO TABS
1.0000 mg | ORAL_TABLET | Freq: Four times a day (QID) | ORAL | Status: AC
  Administered 2019-01-23 – 2019-01-24 (×4): 1 mg via ORAL
  Filled 2019-01-23 (×5): qty 1

## 2019-01-23 MED ORDER — ONDANSETRON HCL 4 MG/2ML IJ SOLN
INTRAMUSCULAR | Status: AC
  Administered 2019-01-23: 10:00:00 4 mg via INTRAVENOUS
  Filled 2019-01-23: qty 2

## 2019-01-23 MED ORDER — LORAZEPAM 2 MG/ML IJ SOLN
1.0000 mg | INTRAMUSCULAR | Status: DC | PRN
  Administered 2019-01-23 (×3): 2 mg via INTRAVENOUS
  Filled 2019-01-23 (×3): qty 1

## 2019-01-23 MED ORDER — LORAZEPAM 1 MG PO TABS
1.0000 mg | ORAL_TABLET | Freq: Three times a day (TID) | ORAL | Status: AC
  Administered 2019-01-24 – 2019-01-25 (×3): 1 mg via ORAL
  Filled 2019-01-23 (×3): qty 1

## 2019-01-23 MED ORDER — VANCOMYCIN HCL IN DEXTROSE 1-5 GM/200ML-% IV SOLN
1000.0000 mg | Freq: Once | INTRAVENOUS | Status: AC
  Administered 2019-01-23: 12:00:00 1000 mg via INTRAVENOUS
  Filled 2019-01-23: qty 200

## 2019-01-23 MED ORDER — ONDANSETRON HCL 4 MG PO TABS: 4.0000 mg | ORAL_TABLET | Freq: Four times a day (QID) | ORAL | Status: DC | PRN

## 2019-01-23 MED ORDER — ADULT MULTIVITAMIN W/MINERALS CH
1.0000 | ORAL_TABLET | Freq: Every day | ORAL | Status: DC
  Administered 2019-01-23 – 2019-01-25 (×3): 1 via ORAL
  Filled 2019-01-23 (×3): qty 1

## 2019-01-23 MED ORDER — FOLIC ACID 1 MG PO TABS
1.0000 mg | ORAL_TABLET | Freq: Every day | ORAL | Status: DC
  Administered 2019-01-23 – 2019-01-25 (×3): 1 mg via ORAL
  Filled 2019-01-23 (×3): qty 1

## 2019-01-23 MED ORDER — NICOTINE 21 MG/24HR TD PT24
21.0000 mg | MEDICATED_PATCH | Freq: Every day | TRANSDERMAL | Status: DC
  Administered 2019-01-23 – 2019-01-25 (×3): 21 mg via TRANSDERMAL
  Filled 2019-01-23 (×3): qty 1

## 2019-01-23 MED ORDER — SODIUM CHLORIDE 0.9 % IV BOLUS
1000.0000 mL | Freq: Once | INTRAVENOUS | Status: AC
  Administered 2019-01-23: 1000 mL via INTRAVENOUS

## 2019-01-23 MED ORDER — POLYETHYLENE GLYCOL 3350 17 G PO PACK: 17.0000 g | PACK | Freq: Every day | ORAL | Status: DC | PRN

## 2019-01-23 MED ORDER — LORAZEPAM 1 MG PO TABS
1.0000 mg | ORAL_TABLET | ORAL | Status: DC | PRN
  Administered 2019-01-24: 1 mg via ORAL
  Administered 2019-01-25: 04:00:00 2 mg via ORAL
  Filled 2019-01-23: qty 4
  Filled 2019-01-23 (×2): qty 1

## 2019-01-23 MED ORDER — HYDROXYZINE HCL 25 MG PO TABS: 25.0000 mg | ORAL_TABLET | Freq: Four times a day (QID) | ORAL | Status: DC | PRN

## 2019-01-23 MED ORDER — HYDROCHLOROTHIAZIDE 12.5 MG PO CAPS
12.5000 mg | ORAL_CAPSULE | Freq: Every day | ORAL | Status: DC
  Administered 2019-01-23 – 2019-01-25 (×3): 12.5 mg via ORAL
  Filled 2019-01-23 (×3): qty 1

## 2019-01-23 MED ORDER — THIAMINE HCL 100 MG/ML IJ SOLN
100.0000 mg | Freq: Every day | INTRAMUSCULAR | Status: DC
  Filled 2019-01-23: qty 2

## 2019-01-23 MED ORDER — LOPERAMIDE HCL 2 MG PO CAPS: 2.0000 mg | ORAL_CAPSULE | ORAL | Status: DC | PRN

## 2019-01-23 MED ORDER — SODIUM CHLORIDE 0.9 % IV SOLN
2.0000 g | Freq: Once | INTRAVENOUS | Status: AC
  Administered 2019-01-23: 11:00:00 2 g via INTRAVENOUS
  Filled 2019-01-23: qty 2

## 2019-01-23 MED ORDER — ADULT MULTIVITAMIN W/MINERALS CH
1.0000 | ORAL_TABLET | Freq: Every day | ORAL | Status: DC
  Filled 2019-01-23: qty 1

## 2019-01-23 MED ORDER — ENOXAPARIN SODIUM 40 MG/0.4ML ~~LOC~~ SOLN
40.0000 mg | SUBCUTANEOUS | Status: DC
  Administered 2019-01-23 – 2019-01-25 (×3): 40 mg via SUBCUTANEOUS
  Filled 2019-01-23 (×3): qty 0.4

## 2019-01-23 MED ORDER — LORAZEPAM 1 MG PO TABS: 1.0000 mg | ORAL_TABLET | Freq: Every day | ORAL | Status: DC

## 2019-01-23 MED ORDER — ACETAMINOPHEN 325 MG PO TABS: 650.0000 mg | ORAL_TABLET | Freq: Four times a day (QID) | ORAL | Status: DC | PRN

## 2019-01-23 MED ORDER — HYDROCODONE-ACETAMINOPHEN 5-325 MG PO TABS
1.0000 | ORAL_TABLET | Freq: Once | ORAL | Status: AC
  Administered 2019-01-23: 11:00:00 1 via ORAL
  Filled 2019-01-23: qty 1

## 2019-01-23 MED ORDER — SODIUM CHLORIDE 0.9 % IV SOLN
INTRAVENOUS | Status: DC
  Administered 2019-01-23 – 2019-01-24 (×2): via INTRAVENOUS

## 2019-01-23 MED ORDER — ONDANSETRON HCL 4 MG/2ML IJ SOLN
4.0000 mg | Freq: Once | INTRAMUSCULAR | Status: AC
  Administered 2019-01-23: 10:00:00 4 mg via INTRAVENOUS

## 2019-01-23 MED ORDER — THIAMINE HCL 100 MG/ML IJ SOLN
100.0000 mg | Freq: Once | INTRAMUSCULAR | Status: DC
  Filled 2019-01-23 (×2): qty 2

## 2019-01-23 MED ORDER — VITAMIN B-1 100 MG PO TABS
100.0000 mg | ORAL_TABLET | Freq: Every day | ORAL | Status: DC
  Administered 2019-01-24 – 2019-01-25 (×2): 100 mg via ORAL
  Filled 2019-01-23 (×2): qty 1

## 2019-01-23 MED ORDER — TRAZODONE HCL 100 MG PO TABS
100.0000 mg | ORAL_TABLET | Freq: Every day | ORAL | Status: DC
  Administered 2019-01-23 – 2019-01-24 (×2): 100 mg via ORAL
  Filled 2019-01-23 (×2): qty 1

## 2019-01-23 MED ORDER — LISINOPRIL 20 MG PO TABS
20.0000 mg | ORAL_TABLET | Freq: Every day | ORAL | Status: DC
  Administered 2019-01-23 – 2019-01-25 (×3): 20 mg via ORAL
  Filled 2019-01-23 (×3): qty 1

## 2019-01-23 MED ORDER — ONDANSETRON 4 MG PO TBDP
4.0000 mg | ORAL_TABLET | Freq: Four times a day (QID) | ORAL | Status: DC | PRN
  Filled 2019-01-23: qty 1

## 2019-01-23 MED ORDER — LISINOPRIL-HYDROCHLOROTHIAZIDE 20-12.5 MG PO TABS: 1.0000 | ORAL_TABLET | Freq: Every day | ORAL | Status: DC

## 2019-01-23 MED ORDER — LORAZEPAM 1 MG PO TABS
1.0000 mg | ORAL_TABLET | Freq: Four times a day (QID) | ORAL | Status: DC | PRN
  Administered 2019-01-24 – 2019-01-25 (×2): 1 mg via ORAL
  Filled 2019-01-23: qty 1

## 2019-01-23 MED ORDER — SERTRALINE HCL 50 MG PO TABS
50.0000 mg | ORAL_TABLET | Freq: Every day | ORAL | Status: DC
  Administered 2019-01-23 – 2019-01-25 (×3): 50 mg via ORAL
  Filled 2019-01-23 (×3): qty 1

## 2019-01-23 MED ORDER — TRAZODONE HCL 100 MG PO TABS: 100.0000 mg | ORAL_TABLET | Freq: Every day | ORAL | Status: DC

## 2019-01-23 MED ORDER — LORAZEPAM 2 MG/ML IJ SOLN: 1.0000 mg | INTRAMUSCULAR | Status: DC | PRN

## 2019-01-23 MED ORDER — ONDANSETRON HCL 4 MG/2ML IJ SOLN
4.0000 mg | Freq: Four times a day (QID) | INTRAMUSCULAR | Status: DC | PRN
  Administered 2019-01-24: 4 mg via INTRAVENOUS
  Filled 2019-01-23: qty 2
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
## 2019-01-23 NOTE — ED Triage Notes (Signed)
PT arrives with complaints of chest pain and weakness that started yesterday.

## 2019-01-23 NOTE — Progress Notes (Signed)
Family Meeting Note  Advance Directive:yes  Today a meeting took place with the Patient.  Patient is able to participate.  The following clinical team members were present during this meeting:MD  The following were discussed:Patient's diagnosis: alcoholic ketoacidosis, Patient's progosis: Unable to determine and Goals for treatment: Full Code  Additional follow-up to be provided: prn  Time spent during discussion:20 minutes  Evette Doffing, MD

## 2019-01-23 NOTE — ED Notes (Signed)
Pt's family called, pt given phone to talk to family
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
## 2019-01-23 NOTE — ED Notes (Signed)
Elevated lactic acid reviewed with MD Siadecki

## 2019-01-24 LAB — CBC
HCT: 33.7 % — ABNORMAL LOW (ref 39.0–52.0)
Hemoglobin: 11.2 g/dL — ABNORMAL LOW (ref 13.0–17.0)
MCH: 30.2 pg (ref 26.0–34.0)
MCHC: 33.2 g/dL (ref 30.0–36.0)
MCV: 90.8 fL (ref 80.0–100.0)
Platelets: 103 10*3/uL — ABNORMAL LOW (ref 150–400)
RBC: 3.71 MIL/uL — ABNORMAL LOW (ref 4.22–5.81)
RDW: 15 % (ref 11.5–15.5)
WBC: 4.3 10*3/uL (ref 4.0–10.5)
nRBC: 0 % (ref 0.0–0.2)

## 2019-01-24 LAB — COMPREHENSIVE METABOLIC PANEL
ALT: 35 U/L (ref 0–44)
AST: 87 U/L — ABNORMAL HIGH (ref 15–41)
Albumin: 3.1 g/dL — ABNORMAL LOW (ref 3.5–5.0)
Alkaline Phosphatase: 111 U/L (ref 38–126)
Anion gap: 8 (ref 5–15)
BUN: 8 mg/dL (ref 8–23)
CO2: 26 mmol/L (ref 22–32)
Calcium: 8.3 mg/dL — ABNORMAL LOW (ref 8.9–10.3)
Chloride: 103 mmol/L (ref 98–111)
Creatinine, Ser: 0.6 mg/dL — ABNORMAL LOW (ref 0.61–1.24)
GFR calc Af Amer: >60 mL/min (ref >60)
GFR calc non Af Amer: >60 mL/min (ref >60)
Glucose, Bld: 124 mg/dL — ABNORMAL HIGH (ref 70–99)
Potassium: 3.6 mmol/L (ref 3.5–5.1)
Sodium: 137 mmol/L (ref 135–145)
Total Bilirubin: 1.3 mg/dL — ABNORMAL HIGH (ref 0.3–1.2)
Total Protein: 5.4 g/dL — ABNORMAL LOW (ref 6.5–8.1)

## 2019-01-24 MED ORDER — APIXABAN 5 MG PO TABS: 5.0000 mg | ORAL_TABLET | Freq: Two times a day (BID) | ORAL | 0 refills | Status: DC

## 2019-01-24 MED ORDER — GABAPENTIN 300 MG PO CAPS: 300.0000 mg | ORAL_CAPSULE | Freq: Three times a day (TID) | ORAL | 0 refills | Status: DC

## 2019-01-24 MED ORDER — HYDROCODONE-ACETAMINOPHEN 5-325 MG PO TABS: 1.0000 | ORAL_TABLET | Freq: Three times a day (TID) | ORAL | 0 refills | Status: DC | PRN

## 2019-01-24 MED ORDER — LORAZEPAM 1 MG PO TABS: ORAL_TABLET | ORAL | 0 refills | Status: DC

## 2019-01-24 MED ORDER — SERTRALINE HCL 50 MG PO TABS: 50.0000 mg | ORAL_TABLET | Freq: Every day | ORAL | 0 refills | Status: DC

## 2019-01-24 MED ORDER — TRAZODONE HCL 100 MG PO TABS: 100.0000 mg | ORAL_TABLET | Freq: Every day | ORAL | 0 refills | Status: DC

## 2019-01-24 MED ORDER — GABAPENTIN 300 MG PO CAPS
300.0000 mg | ORAL_CAPSULE | Freq: Three times a day (TID) | ORAL | Status: DC
  Administered 2019-01-24 – 2019-01-25 (×5): 300 mg via ORAL
  Filled 2019-01-24 (×5): qty 1

## 2019-01-24 MED ORDER — HYDROCODONE-ACETAMINOPHEN 5-325 MG PO TABS
1.0000 | ORAL_TABLET | Freq: Three times a day (TID) | ORAL | Status: DC | PRN
  Administered 2019-01-24 (×2): 1 via ORAL
  Filled 2019-01-24 (×2): qty 1

## 2019-01-24 MED ORDER — DOXAZOSIN MESYLATE 8 MG PO TABS: 8.0000 mg | ORAL_TABLET | Freq: Every day | ORAL | 0 refills | Status: DC

## 2019-01-24 NOTE — Discharge Instructions (Signed)
It was so nice to meet you during this hospitalization!  You came into the hospital with depression and alcohol withdrawal. You were also having some back pain. You were seen by the psychiatrist, who started you on Zoloft 50mg  daily. Please stop taking the lexapro.  I have also prescribed some norco and gabapentin for your back/leg pain. You should take the gabapentin three times a day. You can also take the norco three times a day as needed for severe pain.  I have prescribed an ativan taper for you to help you safely withdrawal from alcohol. Please take the ativan as follows: -Take one tablet this afternoon and this evening at home -Take one tablet four times a day tomorrow 10/6 -Take one tablet three times a day on 10/7 -Take one tablet twice a day on 10/8 -Take one tablet once daily on 10/9  Take care, Dr. Brett Albino

## 2019-01-24 NOTE — Care Management CC44 (Signed)
Condition Code 44 Documentation Completed  Patient Details  Name: Chad [name] MRN: [id number] Date of Birth: 1950/06/08   Condition Code 44 given:  Yes Patient signature on Condition Code 44 notice:  Yes Documentation of 2 MD's agreement:  Yes Code 44 added to claim:  Yes    Ross Ludwig, LCSW 01/24/2019, 1:05 PM

## 2019-01-24 NOTE — Plan of Care (Signed)
  Problem: Education: Goal: Knowledge of General Education information will improve Description: Including pain rating scale, medication(s)/side effects and non-pharmacologic comfort measures 01/24/2019 1903 by Basilio Cairo, RN Outcome: Progressing 01/24/2019 1417 by Basilio Cairo, RN Outcome: Progressing   Problem: Health Behavior/Discharge Planning: Goal: Ability to manage health-related needs will improve 01/24/2019 1903 by Basilio Cairo, RN Outcome: Progressing 01/24/2019 1417 by Basilio Cairo, RN Outcome: Progressing

## 2019-01-24 NOTE — Discharge Summary (Addendum)
[employer] [facility] [place] [facility]   PATIENT NAME: Chad [name]    MR#:  [id number]  DATE OF BIRTH:  June 21, 1950  DATE OF ADMISSION:  01/23/2019   ADMITTING PHYSICIAN: Chad Stall, MD  DATE OF DISCHARGE: 01/24/19  PRIMARY CARE PHYSICIAN: Chad Humphrey, MD   ADMISSION DIAGNOSIS:  Dehydration [E86.0] Alcohol abuse [F10.10] Generalized weakness [R53.1] DISCHARGE DIAGNOSIS:  Principal Problem:   Alcohol dependence with withdrawal with complication (HCC) Active Problems:   Alcoholic ketoacidosis   Alcohol-induced mood disorder (HCC)   Anxiety  SECONDARY DIAGNOSIS:   Past Medical History:  Diagnosis Date  . Anxiety    takes lexapro  . Chronic kidney disease    patient states he was told he has 'moderate kidney disease'  . Degenerative disc disease, lumbar   . Exposure to hepatitis C   . GERD (gastroesophageal reflux disease)    takes OTC as needed - omeprazole  . Headache    on occasion  . History of gallstones   . Hypertension    diet controlled  . Pneumonia approx 15 years ago   HOSPITAL COURSE:   [name] is a 68 year old male who presented to the ED with generalized weakness and alcohol withdrawal.  In the ED, anion gap was 17 and lactic acid was 5.0.  Chest x-ray was unremarkable.  He was admitted for further management.  Lactic acidosis- resolved with IV fluids.  Possibly due to alcoholic ketoacidosis versus dehydration. No signs of infection. -Encouraged p.o. intake -Blood cultures with no growth at the time of discharge  Alcohol abuse- patient drinks 1 pint per day -Seen by psychiatry, who recommended an Ativan taper on discharge  Elevated AST- likely due to alcohol abuse -Recheck CMP as an outpatient  Generalized weakness- likely due to chronic malnutrition and dehydration in the setting of alcohol use -PT consulted- recommended SNF.discharge  Hypertension -Continued home Cardura and lisinopril-HCTZ  History of DVT  -Not taking Eliquis due to cost -Eliquis was prescribed on discharge.  Patient was seen by care management for medication assistance.  Uncontrolled depression/anxiety- unable to afford Lexapro at home -Psychiatry consulted- started patient on zoloft and recommended ativan taper for alcohol detox -Continued home trazodone  Tobacco use- smokes 3/4th pack per day -Nicotine patch ordered -Tobacco cessation counseling performed by me  DISCHARGE CONDITIONS:  Alcohol abuse Generalized weakness Hypertension Uncontrolled depression/anxiety Tobacco use CONSULTS OBTAINED:  Psychiatry DRUG ALLERGIES:   Allergies  Allergen Reactions  . Morphine And Related Other (See Comments)    Causes "bad disposition"  . Ibuprofen Other (See Comments)    Gi upset   DISCHARGE MEDICATIONS:   Allergies as of 01/24/2019      Reactions   Morphine And Related Other (See Comments)   Causes "bad disposition"   Ibuprofen Other (See Comments)   Gi upset      Medication List    STOP taking these medications   chlordiazePOXIDE 10 MG capsule Commonly known as: LIBRIUM   escitalopram 20 MG tablet Commonly known as: Lexapro     TAKE these medications   apixaban 5 MG Tabs tablet Commonly known as: ELIQUIS Take 1 tablet (5 mg total) by mouth 2 (two) times daily. Take 10 mg PO BID X 6 days then take 5 mg BID thereafter. What changed:   how much to take  how to take this  when to take this   doxazosin 8 MG tablet Commonly known as: CARDURA Take 1 tablet (8 mg total) by mouth  at bedtime.   gabapentin 300 MG capsule Commonly known as: NEURONTIN Take 1 capsule (300 mg total) by mouth 3 (three) times daily.   HYDROcodone-acetaminophen 5-325 MG tablet Commonly known as: NORCO/VICODIN Take 1 tablet by mouth 3 (three) times daily as needed for severe pain. What changed:   when to take this  reasons to take this   lisinopril-hydrochlorothiazide 20-12.5 MG tablet Commonly known as:  ZESTORETIC Take 1 tablet by mouth daily.   sertraline 50 MG tablet Commonly known as: ZOLOFT Take 1 tablet (50 mg total) by mouth daily. Start taking on: January 25, 2019   traZODone 100 MG tablet Commonly known as: DESYREL Take 1 tablet (100 mg total) by mouth at bedtime.        DISCHARGE INSTRUCTIONS:  1.  Follow-up with PCP in 5 days 2.  Take Ativan taper as prescribed 3.  Start Zoloft 50 mg daily and stop Lexapro 4.  Take Eliquis 5 mg twice daily DIET:  Cardiac diet DISCHARGE CONDITION:  Stable ACTIVITY:  Activity as tolerated OXYGEN:  Home Oxygen: No.  Oxygen Delivery: room air DISCHARGE LOCATION:  SNF  If you experience worsening of your admission symptoms, develop shortness of breath, life threatening emergency, suicidal or homicidal thoughts you must seek medical attention immediately by calling 911 or calling your MD immediately  if symptoms less severe.  You Must read complete instructions/literature along with all the possible adverse reactions/side effects for all the Medicines you take and that have been prescribed to you. Take any new Medicines after you have completely understood and accpet all the possible adverse reactions/side effects.   Please note  You were cared for by a hospitalist during your hospital stay. If you have any questions about your discharge medications or the care you received while you were in the hospital after you are discharged, you can call the unit and asked to speak with the hospitalist on call if the hospitalist that took care of you is not available. Once you are discharged, your primary care physician will handle any further medical issues. Please note that NO REFILLS for any discharge medications will be authorized once you are discharged, as it is imperative that you return to your primary care physician (or establish a relationship with a primary care physician if you do not have one) for your aftercare needs so that they can  reassess your need for medications and monitor your lab values.    On the day of Discharge:  VITAL SIGNS:  Blood pressure 136/71, pulse 84, temperature 98.4 F (36.9 C), temperature source Oral, resp. rate 20, height 5\' 8"  (1.727 m), weight 69.9 kg, SpO2 95 %. PHYSICAL EXAMINATION:  GENERAL:  68 y.o.-year-old patient lying in the bed with no acute distress.  EYES: Pupils equal, round, reactive to light and accommodation. No scleral icterus. Extraocular muscles intact.  HEENT: Head atraumatic, normocephalic. Oropharynx and nasopharynx clear.  NECK:  Supple, no jugular venous distention. No thyroid enlargement, no tenderness.  LUNGS: Normal breath sounds bilaterally, no wheezing, rales,rhonchi or crepitation. No use of accessory muscles of respiration.  CARDIOVASCULAR: RRR, S1, S2 normal. No murmurs, rubs, or gallops.  ABDOMEN: Soft, non-tender, non-distended. Bowel sounds present. No organomegaly or mass.  EXTREMITIES: No pedal edema, cyanosis, or clubbing.  NEUROLOGIC: Cranial nerves II through XII are intact. +global weakness. Sensation intact. Gait not checked. +mildly tremulous PSYCHIATRIC: The patient is alert and oriented x 3.  SKIN: No obvious rash, lesion, or ulcer.  DATA REVIEW:   CBC  Recent Labs  Lab 01/24/19 0613  WBC 4.3  HGB 11.2*  HCT 33.7*  PLT 103*    Chemistries  Recent Labs  Lab 01/23/19 1714 01/24/19 0613  NA  --  137  K  --  3.6  CL  --  103  CO2  --  26  GLUCOSE  --  124*  BUN  --  8  CREATININE  --  0.60*  CALCIUM  --  8.3*  MG 1.7  --   AST  --  87*  ALT  --  35  ALKPHOS  --  111  BILITOT  --  1.3*     Microbiology Results  Results for orders placed or performed during the hospital encounter of 01/23/19  SARS Coronavirus 2 [facility] order, Performed in [facility] hospital lab) Nasopharyngeal Nasopharyngeal Swab     Status: None   Collection Time: 01/23/19 11:14 AM   Specimen: Nasopharyngeal Swab  Result Value Ref Range Status   SARS  Coronavirus 2 NEGATIVE NEGATIVE Final    Comment: (NOTE) If result is NEGATIVE SARS-CoV-2 target nucleic acids are NOT DETECTED. The SARS-CoV-2 RNA is generally detectable in upper and lower  respiratory specimens during the acute phase of infection. The lowest  concentration of SARS-CoV-2 viral copies this assay can detect is 250  copies / mL. A negative result does not preclude SARS-CoV-2 infection  and should not be used as the sole basis for treatment or other  patient management decisions.  A negative result may occur with  improper specimen collection / handling, submission of specimen other  than nasopharyngeal swab, presence of viral mutation(s) within the  areas targeted by this assay, and inadequate number of viral copies  (<250 copies / mL). A negative result must be combined with clinical  observations, patient history, and epidemiological information. If result is POSITIVE SARS-CoV-2 target nucleic acids are DETECTED. The SARS-CoV-2 RNA is generally detectable in upper and lower  respiratory specimens dur ing the acute phase of infection.  Positive  results are indicative of active infection with SARS-CoV-2.  Clinical  correlation with patient history and other diagnostic information is  necessary to determine patient infection status.  Positive results do  not rule out bacterial infection or co-infection with other viruses. If result is PRESUMPTIVE POSTIVE SARS-CoV-2 nucleic acids MAY BE PRESENT.   A presumptive positive result was obtained on the submitted specimen  and confirmed on repeat testing.  While 2019 novel coronavirus  (SARS-CoV-2) nucleic acids may be present in the submitted sample  additional confirmatory testing may be necessary for epidemiological  and / or clinical management purposes  to differentiate between  SARS-CoV-2 and other Sarbecovirus currently known to infect humans.  If clinically indicated additional testing with an alternate test   methodology [phone]) is advised. The SARS-CoV-2 RNA is generally  detectable in upper and lower respiratory sp ecimens during the acute  phase of infection. The expected result is Negative. Fact Sheet for Patients:  BoilerBrush.com.cy Fact Sheet for Healthcare Providers: https://pope.com/ This test is not yet approved or cleared by the [place] FDA and has been authorized for detection and/or diagnosis of SARS-CoV-2 by FDA under an Emergency Use Authorization (EUA).  This EUA will remain in effect (meaning this test can be used) for the duration of the COVID-19 declaration under Section 564(b)(1) of the Act, 21 U.S.C. section 360bbb-3(b)(1), unless the authorization is terminated or revoked sooner. Performed at [facility], [address] [place], [place] [address]  Culture, blood (routine x 2)     Status: None (Preliminary result)   Collection Time: 01/23/19 11:14 AM   Specimen: BLOOD  Result Value Ref Range Status   Specimen Description BLOOD BLOOD LEFT HAND  Final   Special Requests   Final    BOTTLES DRAWN AEROBIC AND ANAEROBIC Blood Culture adequate volume   Culture   Final    NO GROWTH < 24 HOURS Performed at [facility], [address] [place], [place] [address]    Report Status PENDING  Incomplete  Culture, blood (routine x 2)     Status: None (Preliminary result)   Collection Time: 01/23/19 11:14 AM   Specimen: BLOOD  Result Value Ref Range Status   Specimen Description BLOOD RIGHT ANTECUBITAL  Final   Special Requests   Final    BOTTLES DRAWN AEROBIC AND ANAEROBIC Blood Culture adequate volume   Culture   Final    NO GROWTH < 24 HOURS Performed at [facility], [address] [place], [place] [address]    Report Status PENDING  Incomplete    RADIOLOGY:  Dg Chest 2 View  Result Date: 01/23/2019 CLINICAL DATA:  Shortness of breath and chest tightness 2 months. Weakness  beginning yesterday. Cough. EXAM: CHEST - 2 VIEW COMPARISON:  04/04/2018 FINDINGS: Lungs are adequately inflated without focal airspace consolidation or effusion. Cardiomediastinal silhouette and remainder of the exam is unchanged. IMPRESSION: No active cardiopulmonary disease. Electronically Signed   By: Elberta Fortisaniel  Boyle M.D.   On: 01/23/2019 11:16     Management plans discussed with the patient, family and they are in agreement.  CODE STATUS: Full Code   TOTAL TIME TAKING CARE OF THIS PATIENT: 40 minutes.    Jinny BlossomKaty D Angie Piercey M.D on 01/24/2019 at 10:35 AM  Between 7am to 6pm - Pager - [phone]  After 6pm go to [email or web address] - Social research officer, governmentpassword EPAS [facility]  [facility] [place] [facility]  Office  [phone]  CC: Primary care physician; Chad HumphreyGeorge, [name], MD   Note: This dictation was prepared with Dragon dictation along with smaller phrase technology. Any transcriptional errors that result from this process are unintentional.

## 2019-01-24 NOTE — Plan of Care (Signed)
  Problem: Clinical Measurements: Goal: Will remain free from infection Outcome: Progressing   Problem: Clinical Measurements: Goal: Cardiovascular complication will be avoided Outcome: Progressing   

## 2019-01-24 NOTE — [place] FL2 (Signed)
[place] MEDICAID FL2 LEVEL OF CARE SCREENING TOOL     IDENTIFICATION  Patient Name: Chad [name] Birthdate: 07-18-1950 Sex: male Admission Date (Current Location): 01/23/2019  [place]   Facility and Address:  [facility], [address], [place], [place] [address]      Provider Number: [id number]  Attending Physician Name and Address:  Campbell StallMayo, Katy Dodd, MD  Relative Name and Phone Number:       Current Level of Care: Hospital Recommended Level of Care: Skilled Nursing Facility Prior Approval Number:    Date Approved/Denied:   PASRR Number: [id number]  Discharge Plan: SNF    Current Diagnoses: Patient Active Problem List   Diagnosis Date Noted  . Alcoholic ketoacidosis 01/23/2019  . Alcohol dependence with withdrawal with complication (HCC) 01/23/2019  . 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 Anxiety 01/23/2019  . HTN (hypertension) 05/17/2018  . GERD (gastroesophageal reflux disease) 05/17/2018  . Alcohol withdrawal (HCC) 05/17/2018  . DVT (deep venous thrombosis) (HCC) 05/17/2018  . 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 BACK PAIN, LUMBAR 11/10/2006    Orientation RESPIRATION BLADDER Height & Weight     Self, Time, Situation, Place  Normal Continent Weight: 154 lb 1.6 oz (69.9 kg) Height:  5\' 8"  (172.7 cm)  BEHAVIORAL SYMPTOMS/MOOD NEUROLOGICAL BOWEL NUTRITION STATUS      Continent Diet(Low sodium heart healthy)  AMBULATORY STATUS COMMUNICATION OF NEEDS Skin   Limited Assist Verbally Normal                       Personal Care Assistance Level of Assistance  Bathing, Feeding, Dressing Bathing Assistance: Limited assistance Feeding assistance: Independent Dressing Assistance: Limited assistance Total Care Assistance: Limited assistance   Functional Limitations Info  Sight, Speech, Hearing Sight Info: Adequate Hearing Info:  Adequate Speech Info: Adequate    SPECIAL CARE FACTORS FREQUENCY  PT (By licensed PT), OT (By licensed OT)     PT Frequency: Minimum 5x a week OT Frequency: Minimum 5x a week            Contractures Contractures Info: Not present    Additional Factors Info  Code Status, Allergies, Psychotropic Code Status Info: Full Code Allergies Info: Morphine And Related Ibuprofen Psychotropic Info: sertraline (ZOLOFT) tablet 50 mg         Current Medications (01/24/2019):  This is the current hospital active medication list Current Facility-Administered Medications  Medication Dose Route Frequency Provider Last Rate Last Dose  . 0.9 %  sodium chloride infusion   Intravenous Continuous Mayo, Allyn KennerKaty Dodd, MD 100 mL/hr at 01/24/19 0443    . acetaminophen (TYLENOL) tablet 650 mg  650 mg Oral Q6H PRN Mayo, Allyn KennerKaty Dodd, MD       Or  . acetaminophen (TYLENOL) suppository 650 mg  650 mg Rectal Q6H PRN Mayo, Allyn KennerKaty Dodd, MD      . doxazosin (CARDURA) tablet 8 mg  8 mg Oral QHS Mayo, Allyn KennerKaty Dodd, MD   8 mg at 01/23/19 2133  . enoxaparin (LOVENOX) injection 40 mg  40 mg Subcutaneous Q24H Mayo, Allyn KennerKaty Dodd, MD   40 mg at 01/23/19 1829  . folic acid (FOLVITE) tablet 1 mg  1 mg Oral Daily Mayo, Allyn KennerKaty Dodd, MD   1 mg at 01/24/19 1022  . gabapentin (NEURONTIN) capsule 300 mg  300 mg Oral TID Campbell StallMayo, Katy Dodd, MD   300 mg at 01/24/19 1100  . lisinopril (  ZESTRIL) tablet 20 mg  20 mg Oral Daily Mayo, Pete Pelt, MD   20 mg at 01/24/19 1022   And  . hydrochlorothiazide (MICROZIDE) capsule 12.5 mg  12.5 mg Oral Daily Mayo, Pete Pelt, MD   12.5 mg at 01/24/19 1021  . HYDROcodone-acetaminophen (NORCO/VICODIN) 5-325 MG per tablet 1 tablet  1 tablet Oral TID PRN Mayo, Pete Pelt, MD   1 tablet at 01/24/19 1030  . hydrOXYzine (ATARAX/VISTARIL) tablet 25 mg  25 mg Oral Q6H PRN Patrecia Pour, NP      . loperamide (IMODIUM) capsule 2-4 mg  2-4 mg Oral PRN Patrecia Pour, NP      . LORazepam (ATIVAN) injection 1 mg  1 mg  Intravenous Q4H PRN Mayo, Pete Pelt, MD      . LORazepam (ATIVAN) tablet 1-4 mg  1-4 mg Oral Q1H PRN Sela Hua, MD   1 mg at 01/24/19 0754   Or  . LORazepam (ATIVAN) injection 1-4 mg  1-4 mg Intravenous Q1H PRN Mayo, Pete Pelt, MD   2 mg at 01/23/19 1934  . LORazepam (ATIVAN) tablet 1 mg  1 mg Oral Q6H PRN Patrecia Pour, NP      . LORazepam (ATIVAN) tablet 1 mg  1 mg Oral QID Patrecia Pour, NP   1 mg at 01/24/19 1358   Followed by  . LORazepam (ATIVAN) tablet 1 mg  1 mg Oral TID Patrecia Pour, NP       Followed by  . [START ON 01/25/2019] LORazepam (ATIVAN) tablet 1 mg  1 mg Oral BID Patrecia Pour, NP       Followed by  . [START ON 01/27/2019] LORazepam (ATIVAN) tablet 1 mg  1 mg Oral Daily Waylan Boga Y, NP      . multivitamin with minerals tablet 1 tablet  1 tablet Oral Daily Patrecia Pour, NP   1 tablet at 01/24/19 1021  . nicotine (NICODERM CQ - dosed in mg/24 hours) patch 21 mg  21 mg Transdermal Daily Mayo, Pete Pelt, MD   21 mg at 01/23/19 1829  . ondansetron (ZOFRAN) tablet 4 mg  4 mg Oral Q6H PRN Mayo, Pete Pelt, MD       Or  . ondansetron [facility]) injection 4 mg  4 mg Intravenous Q6H PRN Mayo, Pete Pelt, MD   4 mg at 01/24/19 0824  . ondansetron (ZOFRAN-ODT) disintegrating tablet 4 mg  4 mg Oral Q6H PRN Patrecia Pour, NP      . polyethylene glycol (MIRALAX / GLYCOLAX) packet 17 g  17 g Oral Daily PRN Mayo, Pete Pelt, MD      . sertraline (ZOLOFT) tablet 50 mg  50 mg Oral Daily Patrecia Pour, NP   50 mg at 01/24/19 1021  . thiamine (VITAMIN B-1) tablet 100 mg  100 mg Oral Daily Mayo, Pete Pelt, MD   100 mg at 01/24/19 1024   Or  . thiamine (B-1) injection 100 mg  100 mg Intravenous Daily Mayo, Pete Pelt, MD      . thiamine (B-1) injection 100 mg  100 mg Intramuscular Once Patrecia Pour, NP      . thiamine (VITAMIN B-1) tablet 100 mg  100 mg Oral Daily Patrecia Pour, NP   100 mg at 01/24/19 1022  . traZODone (DESYREL) tablet 100 mg  100 mg Oral QHS Patrecia Pour, NP   100 mg at 01/23/19 2133     Discharge Medications:  Please see discharge summary for a list of discharge medications.  Relevant Imaging Results:  Relevant Lab Results:   Additional Information SSN [id number]  Darleene Cleaver, LCSW

## 2019-01-24 NOTE — Progress Notes (Signed)
Patient discharge reschedule for tomorrow, refuse a new IV insertion, MD aware

## 2019-01-24 NOTE — Evaluation (Addendum)
Physical Therapy Evaluation Patient Details Name: Chad JarredMichael Kuyper MRN: [id number] DOB: 02/04/1951 Today's Date: 01/24/2019   History of Present Illness  Per MD: PT is a 68 y.o. male with a known history of hypertension, CKD, alcohol abuse, anxiety who presented to the ED with generalized weakness that started this morning.  Patient states that he is weak all over. Patient states that he has had uncontrolled depression and anxiety over the last several months.  His PCP prescribed him Lexapro, but he is unable to afford it.  He has been drinking a lot of alcohol to help deal with his anxiety.  He was previously drinking 1/5 a day, but has cut back to a pint per day.  He also smokes 3/4 pack of cigarettes per day.  MD assessment includes: Lactic acidosis- likely due to alcoholic ketoacidosis (although no ketones in the urine) and dehydration, ETOH abuse, elevated AST  Clinical Impression  Pt presented with deficits in strength, transfers, mobility, gait, balance, and activity tolerance. Pt reported being in pain while in bed, and was eager to get up and leave room and/or the hospital. Pt completed all bed exercises, but tended to make bigger/broader movements than directed. Pt was min A with mobility, sitting on EOB he was unstable and required cuing to sit further back. Pt required Min A for transfers and ambulation for balance/safety. Pt's resting HR was 104, in sitting 112, standing 119, and after a short walk from the bed to the chair was 137. Session was limited due to tachycardia, fatigue, significant lean on the RW and effortful, antalgic gait. Pt will benefit from PT services in a SNF setting upon discharge to safely address above deficits for decreased caregiver assistance and eventual return to PLOF.    Follow Up Recommendations SNF    Equipment Recommendations  Rolling walker with 5" wheels;3in1 (PT)    Recommendations for Other Services       Precautions / Restrictions  Precautions Precautions: Fall Precaution Comments: Pt reports 6-7 fall include blacking out at least once. Pt falls to his left side mostly, and has scabs, bruises on LUE and LLE. Restrictions Weight Bearing Restrictions: No      Mobility  Bed Mobility Overal bed mobility: Needs Assistance Bed Mobility: Supine to Sit     Supine to sit: Min assist     General bed mobility comments: Pt reached out and grabbed PT hand to pull self up  Transfers Overall transfer level: Needs assistance   Transfers: Sit to/from Stand Sit to Stand: Min assist         General transfer comment: Pt was unsteady  Ambulation/Gait Ambulation/Gait assistance: Min assist Gait Distance (Feet): 8 Feet Assistive device: Rolling walker (2 wheeled) Gait Pattern/deviations: Step-through pattern;Decreased step length - right;Decreased step length - left;Decreased stride length;Drifts right/left Gait velocity: Decreased   General Gait Details: Pt required min A for balance/stability throughout short walk  Stairs            Wheelchair Mobility    Modified Rankin (Stroke Patients Only)       Balance Overall balance assessment: Needs assistance Sitting-balance support: Single extremity supported;Feet supported Sitting balance-Leahy Scale: Fair   Postural control: Right lateral lean Standing balance support: Bilateral upper extremity supported Standing balance-Leahy Scale: Poor                               Pertinent Vitals/Pain Pain Assessment: 0-10 Pain Score: 8  Pain  Location: L glute and going down into this L knee Pain Descriptors / Indicators: Sharp;Shooting;Tightness Pain Intervention(s): Limited activity within patient's tolerance;Monitored during session    Home Living Family/patient expects to be discharged to:: Private residence Living Arrangements: Alone Available Help at Discharge: Other (Comment)(Pt reported being astranged from closest family, and no friends  or neighbors nearby) Type of Home: Apartment Home Access: Stairs to enter Entrance Stairs-Rails: Right;Left(Too wide to reach both) Secretary/administrator of Steps: 13 Home Layout: One level Home Equipment: Cane - single point      Prior Function Level of Independence: Independent with assistive device(s)         Comments: Pt reported being about to complete ADLs Ind, would use the [facility] for community ambulation and sometimes for [facility] distances PRN.     Hand Dominance        Extremity/Trunk Assessment   Upper Extremity Assessment Upper Extremity Assessment: Generalized weakness    Lower Extremity Assessment Lower Extremity Assessment: Generalized weakness    Cervical / Trunk Assessment Cervical / Trunk Assessment: Kyphotic  Communication   Communication: No difficulties  Cognition Arousal/Alertness: Awake/alert Behavior During Therapy: Restless;Impulsive Overall Cognitive Status: No family/caregiver present to determine baseline cognitive functioning                                 General Comments: Pt was antalgic just sitting in bed, eager/impulsive to get up and walk around      General Comments      Exercises Total Joint Exercises Ankle Circles/Pumps: AROM;Strengthening;Both;10 reps;15 reps Quad Sets: AROM;Strengthening;Both;10 reps;15 reps Hip ABduction/ADduction: AROM;Strengthening;Both;10 reps Long Arc Quad: AROM;Strengthening;Both;10 reps   Assessment/Plan    PT Assessment Patient needs continued PT services  PT Problem List Decreased strength;Decreased activity tolerance;Decreased balance;Decreased mobility;Decreased safety awareness       PT Treatment Interventions  Gait training, therapeutic exercise, therapeutic activity, balance training    PT Goals (Current goals can be found in the Care Plan section)  Acute Rehab PT Goals Patient Stated Goal: Stand without wavering PT Goal Formulation: With patient Time For Goal Achievement:  02/04/19 Potential to Achieve Goals: Fair    Frequency Min 2X/week   Barriers to discharge        Co-evaluation               AM-PAC PT "6 Clicks" Mobility  Outcome Measure Help needed turning from your back to your side while in a flat bed without using bedrails?: A Little Help needed moving from lying on your back to sitting on the side of a flat bed without using bedrails?: A Little Help needed moving to and from a bed to a chair (including a wheelchair)?: A Little Help needed standing up from a chair using your arms (e.g., wheelchair or bedside chair)?: A Little Help needed to walk in hospital room?: A Little Help needed climbing 3-5 steps with a railing? : A Lot 6 Click Score: 17    End of Session Equipment Utilized During Treatment: Gait belt Activity Tolerance: Patient limited by fatigue;Treatment limited secondary to medical complications (Comment)(HR 137 with short walk) Patient left: in chair;with call bell/phone within reach Nurse Communication: Mobility status(Pt's HR increased to 137 with short walk, heavy lean into RW, and antalgic gait) PT Visit Diagnosis: Unsteadiness on feet (R26.81);Difficulty in walking, not elsewhere classified (R26.2);Muscle weakness (generalized) (M62.81)    Time: 1324-4010 PT Time Calculation (min) (ACUTE ONLY): 26 min  Charges:              Juanda Crumble "Gus" Jeannette Corpus, SPT  01/24/19, 1:42 PM  D. Scott Brevan Luberto PT, DPT 01/24/19, 1:56 PM

## 2019-01-24 NOTE — TOC Initial Note (Addendum)
Transition of Care [facility]) - Initial/Assessment Note    Patient Details  Name: Chad [name] MRN: [id number] Date of Birth: 03-18-1951  Transition of [facility]) CM/SW Contact:    Darleene Cleaver, LCSW Phone Number: 01/24/2019, 4:05 PM  Clinical Narrative:                  Patient is a 68 year old male who is alert and oriented x3.  Patient states he was at [facility] in September where he stayed for a few days.  Patient originally planned to go home with home health, but then realized he is still quite weak.  Patient is now agreeable to going to SNF for short term rehab.  Patient gave CSW permission to begin bed search in [place].    CSW provided options, and he chose [facility].  CSW spoke to [facility], and they said they can accept patient tomorrow.  CSW updated patient, bedside nurse, and physician.   Patient has history of alcohol use, patient requested information on AA and substance abuse treatment resources.  CSW provided patient with resources for substance abuse.  Patient did not express any other questions or concerns.  Expected Discharge Plan: (Home Health or SNF) Barriers to Discharge: Barriers Resolved, Active Substance Use - Placement, SNF Pending bed offer, SNF Pending transportation   Patient Goals and CMS Choice Patient states their goals for this hospitalization and ongoing recovery are:: To either go to SNF for short term rehab or go home with home health. CMS Medicare.gov Compare Post Acute Care list provided to:: Patient Choice offered to / list presented to : Patient  Expected Discharge Plan and Services Expected Discharge Plan: (Home Health or SNF)     Post Acute Care Choice: Home Health, Skilled Nursing Facility Living arrangements for the past 2 months: Apartment Expected Discharge Date: 01/24/19               DME Arranged: Bedside commode, Walker rolling DME Agency: [employer]       HH Arranged: RN, PT, OT, Nurse's  Aide, Social Work [employer] Agency: [facility] Date HH Agency Contacted: 01/24/19 Time HH Agency Contacted: 1300 Representative spoke with at [facility] Agency: Barbara Cower  Prior Living Arrangements/Services Living arrangements for the past 2 months: Apartment Lives with:: Self Patient language and need for interpreter reviewed:: Yes Do you feel safe going back to the place where you live?: Yes      Need for Family Participation in Patient Care: No (Comment) Care giver support system in place?: No (comment)   Criminal Activity/Legal Involvement Pertinent to Current Situation/Hospitalization: No - Comment as needed  Activities of Daily Living Home Assistive Devices/Equipment: Eyeglasses ADL Screening (condition at time of admission) Patient's cognitive ability adequate to safely complete daily activities?: Yes Is the patient deaf or have difficulty hearing?: No Does the patient have difficulty seeing, even when wearing glasses/contacts?: No Does the patient have difficulty concentrating, remembering, or making decisions?: No Patient able to express need for assistance with ADLs?: Yes Does the patient have difficulty dressing or bathing?: No Independently performs ADLs?: Yes (appropriate for developmental age) Does the patient have difficulty walking or climbing stairs?: No Weakness of Legs: Both Weakness of Arms/Hands: None  Permission Sought/Granted Permission sought to share information with : Facility Medical sales representative, Family Supports Permission granted to share information with : Yes, Verbal Permission Granted     Permission granted to share info w AGENCY: SNF admissions  Emotional Assessment Appearance:: Appears older than stated age Attitude/Demeanor/Rapport: Self-Confident, Engaged, Aggressive (Verbally and/or physically) Affect (typically observed): Appropriate, Frustrated, Restless Orientation: : Oriented to Self, Oriented to Place, Oriented to  Time,  Oriented to Situation Alcohol / Substance Use: Not Applicable Psych Involvement: Yes (comment)  Admission diagnosis:  Dehydration [E86.0] Alcohol abuse [F10.10] Generalized weakness [R53.1] Patient Active Problem List   Diagnosis Date Noted  . Alcoholic ketoacidosis 14/97/0263  . Alcohol dependence with withdrawal with complication ([place]) 78/58/8502  . 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 Anxiety 01/23/2019  . HTN (hypertension) 05/17/2018  . GERD (gastroesophageal reflux disease) 05/17/2018  . Alcohol withdrawal ([place]) 05/17/2018  . DVT (deep venous thrombosis) ([place]) 05/17/2018  . 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 BACK PAIN, LUMBAR 11/10/2006   PCP:  Sharyne Peach, MD Pharmacy:   [id number] [place], [address] [place] [address] Phone: [phone] Fax: [place], [address] [place] [address] [place] [address] Phone: [phone] Fax: [phone]     Social Determinants of Health (SDOH) Interventions    Readmission Risk Interventions No flowsheet data found.

## 2019-01-24 NOTE — Care Management Obs Status (Signed)
[place] NOTIFICATION   Patient Details  Name: Chad [name] MRN: [id number] Date of Birth: 04/16/1951   Medicare Observation Status Notification Given:  Yes    Ross Ludwig, LCSW 01/24/2019, 1:04 PM

## 2019-01-25 LAB — GLUCOSE, CAPILLARY: Glucose-Capillary: 107 mg/dL — ABNORMAL HIGH (ref 70–99)

## 2019-01-25 MED ORDER — NICOTINE 21 MG/24HR TD PT24: 21.0000 mg | MEDICATED_PATCH | Freq: Every day | TRANSDERMAL | 0 refills | Status: DC

## 2019-01-25 MED ORDER — APIXABAN 5 MG PO TABS: 5.0000 mg | ORAL_TABLET | Freq: Two times a day (BID) | ORAL | 0 refills | Status: DC

## 2019-01-25 NOTE — TOC Transition Note (Signed)
Transition of Care [facility]) - CM/SW Discharge Note   Patient Details  Name: Toddy Boyd MRN: [id number] Date of Birth: 08-May-1950  [facility]) CM/SW Contact:  Ross Ludwig, LCSW Phone Number: 01/25/2019, 3:11 PM   Clinical Narrative:     Patient will be going to [facility] SNF.  Patient is agreeable to going to SNF.  Patient to be d/c'ed today to [facility].  Patient and family agreeable to plans will transport via ems RN to call report.      Final next level of care: Skilled Nursing Facility Barriers to Discharge: Barriers Resolved   Patient Goals and CMS Choice Patient states their goals for this hospitalization and ongoing recovery are:: To get some rehab, then return back home with home health CMS Medicare.gov Compare Post Acute Care list provided to:: Patient Choice offered to / list presented to : Patient  Discharge Placement   Existing PASRR number confirmed : 01/25/19          Patient chooses bed at: [facility] Patient to be transferred to facility by: [facility] Name of family member notified: Patient did not have any family member contacted Patient and family notified of of transfer: 01/25/19  Discharge Plan and Services     Post Acute Care Choice: [place]          DME Arranged: Bedside commode, Walker rolling DME Agency: [employer]       HH Arranged: RN, PT, OT, Nurse's Aide, Social Work [employer] Agency: [place] ([place] Date [place]: 01/24/19 Time [place]: 1300 Representative spoke with at [place]: [place]) Interventions     Readmission Risk Interventions No flowsheet data found.

## 2019-01-25 NOTE — Discharge Summary (Signed)
[employer] [facility] [place] at [facility]   PATIENT NAME: Chad [name]    MR#:  [id number]  DATE OF BIRTH:  03/13/1951  DATE OF ADMISSION:  01/23/2019   ADMITTING PHYSICIAN: Campbell Stall, MD  DATE OF DISCHARGE: 01/25/19  PRIMARY CARE PHYSICIAN: Rayetta Humphrey, MD   ADMISSION DIAGNOSIS:  Dehydration [E86.0] Alcohol abuse [F10.10] Generalized weakness [R53.1] DISCHARGE DIAGNOSIS:  Principal Problem:   Alcohol dependence with withdrawal with complication (HCC) Active Problems:   Alcohol withdrawal (HCC)   Alcoholic ketoacidosis   Alcohol-induced mood disorder (HCC)   Anxiety  SECONDARY DIAGNOSIS:   Past Medical History:  Diagnosis Date  . Anxiety    takes lexapro  . Chronic kidney disease    patient states he was told he has 'moderate kidney disease'  . Degenerative disc disease, lumbar   . Exposure to hepatitis C   . GERD (gastroesophageal reflux disease)    takes OTC as needed - omeprazole  . Headache    on occasion  . History of gallstones   . Hypertension    diet controlled  . Pneumonia approx 15 years ago   HOSPITAL COURSE:   Chad [name] is a 68 year old male who presented to the ED with generalized weakness and alcohol withdrawal.  In the ED, anion gap was 17 and lactic acid was 5.0.  Chest x-ray was unremarkable.  He was admitted for further management.  Lactic acidosis- resolved with IV fluids.  Possibly due to alcoholic ketoacidosis versus dehydration. No signs of infection. -Encouraged p.o. intake -Blood cultures with no growth at the time of discharge  Alcohol abuse- patient drinks 1 pint per day -Seen by psychiatry, who recommended an Ativan taper on discharge  Elevated AST- likely due to alcohol abuse -Recheck CMP as an outpatient  Generalized weakness- likely due to chronic malnutrition and dehydration in the setting of alcohol use -PT consulted- recommended SNF  Hypertension -Continued home Cardura and lisinopril-HCTZ   History of DVT -Not taking Eliquis due to cost -Eliquis was prescribed on discharge- will need medication assistance  Uncontrolled depression/anxiety- unable to afford Lexapro at home -Psychiatry consulted- started patient on zoloft and recommended ativan taper for alcohol detox -Continued home trazodone  Tobacco use- smokes 3/4th pack per day -Nicotine patch ordered  DISCHARGE CONDITIONS:  Alcohol abuse Generalized weakness Hypertension Uncontrolled depression/anxiety Tobacco use CONSULTS OBTAINED:  Psychiatry DRUG ALLERGIES:   Allergies  Allergen Reactions  . Morphine And Related Other (See Comments)    Causes "bad disposition"  . Ibuprofen Other (See Comments)    Gi upset   DISCHARGE MEDICATIONS:   Allergies as of 01/25/2019      Reactions   Morphine And Related Other (See Comments)   Causes "bad disposition"   Ibuprofen Other (See Comments)   Gi upset      Medication List    STOP taking these medications   chlordiazePOXIDE 10 MG capsule Commonly known as: LIBRIUM   escitalopram 20 MG tablet Commonly known as: Lexapro     TAKE these medications   apixaban 5 MG Tabs tablet Commonly known as: ELIQUIS Take 1 tablet (5 mg total) by mouth 2 (two) times daily. What changed:   how much to take  how to take this  when to take this  additional instructions   doxazosin 8 MG tablet Commonly known as: CARDURA Take 1 tablet (8 mg total) by mouth at bedtime.   gabapentin 300 MG capsule Commonly known as: NEURONTIN Take 1 capsule (300 mg total)  by mouth 3 (three) times daily.   HYDROcodone-acetaminophen 5-325 MG tablet Commonly known as: NORCO/VICODIN Take 1 tablet by mouth 3 (three) times daily as needed for severe pain. What changed:   when to take this  reasons to take this   lisinopril-hydrochlorothiazide 20-12.5 MG tablet Commonly known as: ZESTORETIC Take 1 tablet by mouth daily.   LORazepam 1 MG tablet Commonly known as: Ativan Take 1  tablet by mouth 4 times a day, tonight and tomorrow. Then take 1 tablet three times a day on 10/7. Then take 1 tablet twice a day on 10/8. Then take 1 tablet once a day on 10/9.   sertraline 50 MG tablet Commonly known as: ZOLOFT Take 1 tablet (50 mg total) by mouth daily.   traZODone 100 MG tablet Commonly known as: DESYREL Take 1 tablet (100 mg total) by mouth at bedtime.            Durable Medical Equipment  (From admission, onward)         Start     Ordered   01/24/19 1257  For home use only DME Walker rolling  Once    Question:  Patient needs a walker to treat with the following condition  Answer:  Generalized weakness   01/24/19 1257   01/24/19 1257  For home use only DME 3 n 1  Once     01/24/19 1257           DISCHARGE INSTRUCTIONS:  1.  Follow-up with PCP in 5 days 2.  Take Ativan taper as prescribed 3.  Start Zoloft 50 mg daily and stop Lexapro 4.  Take Eliquis 5 mg twice daily DIET:  Cardiac diet DISCHARGE CONDITION:  Stable ACTIVITY:  Activity as tolerated OXYGEN:  Home Oxygen: No.  Oxygen Delivery: room air DISCHARGE LOCATION:  SNF  If you experience worsening of your admission symptoms, develop shortness of breath, life threatening emergency, suicidal or homicidal thoughts you must seek medical attention immediately by calling 911 or calling your MD immediately  if symptoms less severe.  You Must read complete instructions/literature along with all the possible adverse reactions/side effects for all the Medicines you take and that have been prescribed to you. Take any new Medicines after you have completely understood and accpet all the possible adverse reactions/side effects.   Please note  You were cared for by a hospitalist during your hospital stay. If you have any questions about your discharge medications or the care you received while you were in the hospital after you are discharged, you can call the unit and asked to speak with the  hospitalist on call if the hospitalist that took care of you is not available. Once you are discharged, your primary care physician will handle any further medical issues. Please note that NO REFILLS for any discharge medications will be authorized once you are discharged, as it is imperative that you return to your primary care physician (or establish a relationship with a primary care physician if you do not have one) for your aftercare needs so that they can reassess your need for medications and monitor your lab values.    On the day of Discharge:  VITAL SIGNS:  Blood pressure 128/77, pulse 99, temperature 97.9 F (36.6 C), temperature source Oral, resp. rate 16, height 5\' 8"  (1.727 m), weight 69.9 kg, SpO2 95 %. PHYSICAL EXAMINATION:  GENERAL:  68 y.o.-year-old patient lying in the bed with no acute distress. Well-appearing. EYES: Pupils equal, round, reactive to light  and accommodation. No scleral icterus. Extraocular muscles intact.  HEENT: Head atraumatic, normocephalic. Oropharynx and nasopharynx clear.  NECK:  Supple, no jugular venous distention. No thyroid enlargement, no tenderness.  LUNGS: Normal breath sounds bilaterally, no wheezing, rales,rhonchi or crepitation. No use of accessory muscles of respiration.  CARDIOVASCULAR: RRR, S1, S2 normal. No murmurs, rubs, or gallops.  ABDOMEN: Soft, non-tender, non-distended. Bowel sounds present. No organomegaly or mass.  EXTREMITIES: No pedal edema, cyanosis, or clubbing.  NEUROLOGIC: Cranial nerves II through XII are intact. +global weakness. Sensation intact. Gait not checked. +mildly tremulous PSYCHIATRIC: The patient is alert and oriented x 3.  SKIN: No obvious rash, lesion, or ulcer.  DATA REVIEW:   CBC Recent Labs  Lab 01/24/19 0613  WBC 4.3  HGB 11.2*  HCT 33.7*  PLT 103*    Chemistries  Recent Labs  Lab 01/23/19 1714 01/24/19 0613  NA  --  137  K  --  3.6  CL  --  103  CO2  --  26  GLUCOSE  --  124*  BUN  --  8   CREATININE  --  0.60*  CALCIUM  --  8.3*  MG 1.7  --   AST  --  87*  ALT  --  35  ALKPHOS  --  111  BILITOT  --  1.3*     Microbiology Results  Results for orders placed or performed during the hospital encounter of 01/23/19  SARS Coronavirus 2 [facility] order, Performed in [facility] hospital lab) Nasopharyngeal Nasopharyngeal Swab     Status: None   Collection Time: 01/23/19 11:14 AM   Specimen: Nasopharyngeal Swab  Result Value Ref Range Status   SARS Coronavirus 2 NEGATIVE NEGATIVE Final    Comment: (NOTE) If result is NEGATIVE SARS-CoV-2 target nucleic acids are NOT DETECTED. The SARS-CoV-2 RNA is generally detectable in upper and lower  respiratory specimens during the acute phase of infection. The lowest  concentration of SARS-CoV-2 viral copies this assay can detect is 250  copies / mL. A negative result does not preclude SARS-CoV-2 infection  and should not be used as the sole basis for treatment or other  patient management decisions.  A negative result may occur with  improper specimen collection / handling, submission of specimen other  than nasopharyngeal swab, presence of viral mutation(s) within the  areas targeted by this assay, and inadequate number of viral copies  (<250 copies / mL). A negative result must be combined with clinical  observations, patient history, and epidemiological information. If result is POSITIVE SARS-CoV-2 target nucleic acids are DETECTED. The SARS-CoV-2 RNA is generally detectable in upper and lower  respiratory specimens dur ing the acute phase of infection.  Positive  results are indicative of active infection with SARS-CoV-2.  Clinical  correlation with patient history and other diagnostic information is  necessary to determine patient infection status.  Positive results do  not rule out bacterial infection or co-infection with other viruses. If result is PRESUMPTIVE POSTIVE SARS-CoV-2 nucleic acids MAY BE PRESENT.   A  presumptive positive result was obtained on the submitted specimen  and confirmed on repeat testing.  While 2019 novel coronavirus  (SARS-CoV-2) nucleic acids may be present in the submitted sample  additional confirmatory testing may be necessary for epidemiological  and / or clinical management purposes  to differentiate between  SARS-CoV-2 and other Sarbecovirus currently known to infect humans.  If clinically indicated additional testing with an alternate test  methodology [id number]) is advised.  The SARS-CoV-2 RNA is generally  detectable in upper and lower respiratory sp ecimens during the acute  phase of infection. The expected result is Negative. Fact Sheet for Patients:  BoilerBrush.com.cyhttps://www.fda.gov/media/136312/download Fact Sheet for Healthcare Providers: https://pope.com/https://www.fda.gov/media/136313/download This test is not yet approved or cleared by the [place] FDA and has been authorized for detection and/or diagnosis of SARS-CoV-2 by FDA under an Emergency Use Authorization (EUA).  This EUA will remain in effect (meaning this test can be used) for the duration of the COVID-19 declaration under Section 564(b)(1) of the Act, 21 U.S.C. section 360bbb-3(b)(1), unless the authorization is terminated or revoked sooner. Performed at [facility], [address] [place], [place] [address]   Culture, blood (routine x 2)     Status: None (Preliminary result)   Collection Time: 01/23/19 11:14 AM   Specimen: BLOOD  Result Value Ref Range Status   Specimen Description BLOOD BLOOD LEFT HAND  Final   Special Requests   Final    BOTTLES DRAWN AEROBIC AND ANAEROBIC Blood Culture adequate volume   Culture   Final    NO GROWTH 2 DAYS Performed at [facility], [address] [place], [place] [address]    Report Status PENDING  Incomplete  Culture, blood (routine x 2)     Status: None (Preliminary result)   Collection Time: 01/23/19 11:14 AM   Specimen: BLOOD  Result Value Ref  Range Status   Specimen Description BLOOD RIGHT ANTECUBITAL  Final   Special Requests   Final    BOTTLES DRAWN AEROBIC AND ANAEROBIC Blood Culture adequate volume   Culture   Final    NO GROWTH 2 DAYS Performed at [facility], [address] [place], [place] [address]    Report Status PENDING  Incomplete    RADIOLOGY:  No results found.   Management plans discussed with the patient, family and they are in agreement.  CODE STATUS: Full Code   TOTAL TIME TAKING CARE OF THIS PATIENT: 40 minutes.    Jinny BlossomKaty D Julien Berryman M.D on 01/25/2019 at 10:28 AM  Between 7am to 6pm - Pager - [phone]  After 6pm go to [email or web address] - Social research officer, governmentpassword [email or web address] [email or web address]  [facility] [place] [facility]  Office  [phone]  CC: Primary care physician; [name], [name], MD   Note: This dictation was prepared with Dragon dictation along with smaller phrase technology. Any transcriptional errors that result from this process are unintentional.

## 2019-01-25 NOTE — Progress Notes (Signed)
EMS called for non emergent transport to [facility].

## 2019-01-25 NOTE — Progress Notes (Signed)
Patient was able to ambulate to EMS stretcher with use of cane, approx 10 feet. Complained only of back discomfort, a chronic problem. No other complaints or stated hallucinations. EMS was updated regarding recent oral Ativan, and recent VS. Pt was then transported via stretcher with EMS.

## 2019-01-25 NOTE — Progress Notes (Signed)
Pt is increasingly anxious and tremulous. He is confused and hallucinating; seeing "blood" on the wall. He does follow commands with hesitance. Given extra Ativan 1 mg po; was given 1 mg po as scheduled.

## 2019-01-25 NOTE — Progress Notes (Signed)
Report called to [place] at [facility].

## 2019-01-25 NOTE — Plan of Care (Signed)

## 2019-01-28 LAB — CULTURE, BLOOD (ROUTINE X 2)
Culture: NO GROWTH
Culture: NO GROWTH
Special Requests: ADEQUATE
Special Requests: ADEQUATE

## 2019-02-20 ENCOUNTER — Emergency Department
Admission: EM | Admit: 2019-02-20 | Discharge: 2019-02-20 | Disposition: A | Discharge status: Other Acute Inpatient Hospital | Payer: Medicare Other | Attending: Emergency Medicine | Admitting: Emergency Medicine

## 2019-02-20 ENCOUNTER — Emergency Department: Payer: Medicare Other

## 2019-02-20 ENCOUNTER — Encounter: Payer: Self-pay | Admitting: Emergency Medicine

## 2019-02-20 ENCOUNTER — Other Ambulatory Visit: Payer: Self-pay

## 2019-02-20 DIAGNOSIS — F17210 Nicotine dependence, cigarettes, uncomplicated: Secondary | ICD-10-CM | POA: Insufficient documentation

## 2019-02-20 DIAGNOSIS — E860 Dehydration: Secondary | ICD-10-CM | POA: Diagnosis not present

## 2019-02-20 DIAGNOSIS — I10 Essential (primary) hypertension: Secondary | ICD-10-CM | POA: Insufficient documentation

## 2019-02-20 DIAGNOSIS — Z79899 Other long term (current) drug therapy: Secondary | ICD-10-CM | POA: Diagnosis not present

## 2019-02-20 DIAGNOSIS — M79604 Pain in right leg: Secondary | ICD-10-CM | POA: Diagnosis not present

## 2019-02-20 DIAGNOSIS — M79605 Pain in left leg: Secondary | ICD-10-CM | POA: Insufficient documentation

## 2019-02-20 DIAGNOSIS — Z7901 Long term (current) use of anticoagulants: Secondary | ICD-10-CM | POA: Diagnosis not present

## 2019-02-20 DIAGNOSIS — Y906 Blood alcohol level of 120-199 mg/100 ml: Secondary | ICD-10-CM | POA: Diagnosis not present

## 2019-02-20 DIAGNOSIS — F1010 Alcohol abuse, uncomplicated: Secondary | ICD-10-CM

## 2019-02-20 DIAGNOSIS — F329 Major depressive disorder, single episode, unspecified: Secondary | ICD-10-CM | POA: Diagnosis not present

## 2019-02-20 LAB — COMPREHENSIVE METABOLIC PANEL
ALT: 20 U/L (ref 0–44)
AST: 39 U/L (ref 15–41)
Albumin: 4.6 g/dL (ref 3.5–5.0)
Alkaline Phosphatase: 84 U/L (ref 38–126)
Anion gap: 17 — ABNORMAL HIGH (ref 5–15)
BUN: 7 mg/dL — ABNORMAL LOW (ref 8–23)
CO2: 21 mmol/L — ABNORMAL LOW (ref 22–32)
Calcium: 9.3 mg/dL (ref 8.9–10.3)
Chloride: 99 mmol/L (ref 98–111)
Creatinine, Ser: 0.75 mg/dL (ref 0.61–1.24)
GFR calc Af Amer: >60 mL/min (ref >60)
GFR calc non Af Amer: >60 mL/min (ref >60)
Glucose, Bld: 110 mg/dL — ABNORMAL HIGH (ref 70–99)
Potassium: 4.1 mmol/L (ref 3.5–5.1)
Sodium: 137 mmol/L (ref 135–145)
Total Bilirubin: 1.1 mg/dL (ref 0.3–1.2)
Total Protein: 8.3 g/dL — ABNORMAL HIGH (ref 6.5–8.1)

## 2019-02-20 LAB — CBC WITH DIFFERENTIAL/PLATELET
Abs Immature Granulocytes: 0.05 10*3/uL (ref 0.00–0.07)
Basophils Absolute: 0.1 10*3/uL (ref 0.0–0.1)
Basophils Relative: 1 %
Eosinophils Absolute: 0.1 10*3/uL (ref 0.0–0.5)
Eosinophils Relative: 1 %
HCT: 44.1 % (ref 39.0–52.0)
Hemoglobin: 14.6 g/dL (ref 13.0–17.0)
Immature Granulocytes: 1 %
Lymphocytes Relative: 16 %
Lymphs Abs: 1.4 10*3/uL (ref 0.7–4.0)
MCH: 30.2 pg (ref 26.0–34.0)
MCHC: 33.1 g/dL (ref 30.0–36.0)
MCV: 91.3 fL (ref 80.0–100.0)
Monocytes Absolute: 0.5 10*3/uL (ref 0.1–1.0)
Monocytes Relative: 6 %
Neutro Abs: 6.6 10*3/uL (ref 1.7–7.7)
Neutrophils Relative %: 75 %
Platelets: 297 10*3/uL (ref 150–400)
RBC: 4.83 MIL/uL (ref 4.22–5.81)
RDW: 15.4 % (ref 11.5–15.5)
WBC: 8.8 10*3/uL (ref 4.0–10.5)
nRBC: 0 % (ref 0.0–0.2)

## 2019-02-20 LAB — ETHANOL: Alcohol, Ethyl (B): 186 mg/dL — ABNORMAL HIGH (ref <10)

## 2019-02-20 MED ORDER — VITAMIN B-1 100 MG PO TABS
100.0000 mg | ORAL_TABLET | Freq: Every day | ORAL | Status: DC
  Administered 2019-02-20: 100 mg via ORAL
  Filled 2019-02-20: qty 1

## 2019-02-20 MED ORDER — LORAZEPAM 2 MG/ML IJ SOLN: 0.0000 mg | Freq: Four times a day (QID) | INTRAMUSCULAR | Status: DC

## 2019-02-20 MED ORDER — LORAZEPAM 2 MG/ML IJ SOLN: 0.0000 mg | Freq: Two times a day (BID) | INTRAMUSCULAR | Status: DC

## 2019-02-20 MED ORDER — LORAZEPAM 1 MG PO TABS
1.0000 mg | ORAL_TABLET | Freq: Once | ORAL | Status: AC
  Administered 2019-02-20: 10:00:00 1 mg via ORAL
  Filled 2019-02-20: qty 1

## 2019-02-20 MED ORDER — LACTATED RINGERS IV BOLUS
1000.0000 mL | Freq: Once | INTRAVENOUS | Status: AC
  Administered 2019-02-20: 1000 mL via INTRAVENOUS

## 2019-02-20 MED ORDER — LORAZEPAM 2 MG PO TABS: 0.0000 mg | ORAL_TABLET | Freq: Four times a day (QID) | ORAL | Status: DC

## 2019-02-20 MED ORDER — LORAZEPAM 2 MG PO TABS: 0.0000 mg | ORAL_TABLET | Freq: Two times a day (BID) | ORAL | Status: DC

## 2019-02-20 MED ORDER — THIAMINE HCL 100 MG/ML IJ SOLN: 100.0000 mg | Freq: Every day | INTRAMUSCULAR | Status: DC

## 2019-02-20 NOTE — ED Notes (Signed)
Pt informed of placement at [facility]

## 2019-02-20 NOTE — ED Notes (Signed)
Was told that pt was being considered for inpatient treatment- [facility]- number for report [phone]

## 2019-02-20 NOTE — ED Notes (Signed)
Called dietary and asked them to send pt some food- pt given crackers until it comes

## 2019-02-20 NOTE — ED Triage Notes (Signed)
Pt to ED via [facility], Pt states that he was recently diagnosed with blood clots in his legs, pt is unsure of how long ago. Pt states that he is having generalized weakness and that he has not eaten in 3-4 days because he has been drink. Pt states that he needs long term care. Pt requested to be taken to [place] as soon as Therapist, sports took him into triage room. Pt informed that we are not able to transport him to [place]. Pt is in NAD.

## 2019-02-20 NOTE — ED Notes (Signed)
No signature pad available- printed and had pt sign hard copy for transfer consent

## 2019-02-20 NOTE — ED Notes (Signed)
Pt states he is hungry and wants something warm to eat- will call dietary
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
## 2019-02-20 NOTE — ED Notes (Signed)
emtala reviewed by this RN 

## 2019-02-20 NOTE — ED Notes (Addendum)
Dr Charna Archer informed that pt did not need covid swab for Chad [name]- states he is okay if pt does not have swab done

## 2019-02-20 NOTE — ED Notes (Signed)
Pt given lunch tray.

## 2019-02-20 NOTE — ED Notes (Signed)
Pt states that his legs hurt from blood clots- pt states he wants to go to [facility] and asked what we can do for him- when informing the pt that we have to wait for the doctor to see him and the doctor was on the phone, but would be with him shortly he states "y'all don't care"

## 2019-02-20 NOTE — ED Notes (Signed)
Pt mumbling to himself about being hungry and wanting to be placed in a room- pt asking people if they are the doctor and when the doctor will be to see him- this RN and Chrissy RN have told pt that doctor will be with him shortly

## 2019-02-20 NOTE — ED Notes (Signed)
First nurse note: Pt here via EMS with a desire to get help with etoh and depression. VSS. Also complaining of leg pain. EMS states he is here for medical and psychiatric help.

## 2019-02-20 NOTE — ED Notes (Signed)
Report given to Selena Lesser at [facility]

## 2019-02-20 NOTE — ED Notes (Signed)
IV attempted unsuccessful- another RN to look

## 2019-02-20 NOTE — ED Notes (Signed)
Dr Jessup at bedside 

## 2019-02-20 NOTE — BH Assessment (Signed)
Patient has been accepted to [facility].  Patient assigned to [place] is Dr. Elaina Hoops.  Call report to [phone].  Representative was Alene.   ER Staff is aware of it:  Ronnie, ER Secretary  Dr. Charna Archer, ER MD  Jenetta Downer, Patient's Nurse

## 2019-02-20 NOTE — BH Assessment (Signed)
Patient requesting detox treatment. Patient states he drinks "a fifth a day" for the last "50 years". Longest sobriety lasted "one week". Patient told this writer that he hasn't eaten in several days. He reports having sxs of "depression and anxiety". Patient denied SI/HI/AVH. He also denied hx of seizures. Patient appeared to be somewhat anxious and agitated.  Patient complained about being in the hallway as he waited to be seen by providers. He stated "My tax money built the [employer] ... and all I get is to sit in the hallway. Can I even get something to eat?" Per ED Charge RN and ED staff pt was already given a meal tray since being in the ED.  Patient gave verbal consent to this writer to fax his information to local detox facilities.

## 2019-02-20 NOTE — ED Notes (Signed)
Pt in [place]

## 2019-03-28 ENCOUNTER — Emergency Department
Admission: EM | Admit: 2019-03-28 | Discharge: 2019-03-29 | Disposition: A | Discharge status: Home / Self Care | Payer: Medicare Other | Attending: Emergency Medicine | Admitting: Emergency Medicine

## 2019-03-28 ENCOUNTER — Encounter: Payer: Self-pay | Admitting: Radiology

## 2019-03-28 ENCOUNTER — Emergency Department: Payer: Medicare Other

## 2019-03-28 ENCOUNTER — Other Ambulatory Visit: Payer: Self-pay

## 2019-03-28 DIAGNOSIS — F17210 Nicotine dependence, cigarettes, uncomplicated: Secondary | ICD-10-CM | POA: Insufficient documentation

## 2019-03-28 DIAGNOSIS — I129 Hypertensive chronic kidney disease with stage 1 through stage 4 chronic kidney disease, or unspecified chronic kidney disease: Secondary | ICD-10-CM | POA: Insufficient documentation

## 2019-03-28 DIAGNOSIS — Y903 Blood alcohol level of 60-79 mg/100 ml: Secondary | ICD-10-CM | POA: Insufficient documentation

## 2019-03-28 DIAGNOSIS — R1010 Upper abdominal pain, unspecified: Secondary | ICD-10-CM | POA: Insufficient documentation

## 2019-03-28 DIAGNOSIS — F1010 Alcohol abuse, uncomplicated: Secondary | ICD-10-CM | POA: Diagnosis not present

## 2019-03-28 DIAGNOSIS — N189 Chronic kidney disease, unspecified: Secondary | ICD-10-CM | POA: Diagnosis not present

## 2019-03-28 DIAGNOSIS — Z79899 Other long term (current) drug therapy: Secondary | ICD-10-CM | POA: Diagnosis not present

## 2019-03-28 DIAGNOSIS — R079 Chest pain, unspecified: Secondary | ICD-10-CM | POA: Diagnosis present

## 2019-03-28 LAB — HEPATIC FUNCTION PANEL
ALT: 36 U/L (ref 0–44)
AST: 68 U/L — ABNORMAL HIGH (ref 15–41)
Albumin: 3.9 g/dL (ref 3.5–5.0)
Alkaline Phosphatase: 53 U/L (ref 38–126)
Bilirubin, Direct: 0.2 mg/dL (ref 0.0–0.2)
Indirect Bilirubin: 0.5 mg/dL (ref 0.3–0.9)
Total Bilirubin: 0.7 mg/dL (ref 0.3–1.2)
Total Protein: 6.6 g/dL (ref 6.5–8.1)

## 2019-03-28 LAB — CBC
HCT: 45.5 % (ref 39.0–52.0)
Hemoglobin: 15.5 g/dL (ref 13.0–17.0)
MCH: 31.1 pg (ref 26.0–34.0)
MCHC: 34.1 g/dL (ref 30.0–36.0)
MCV: 91.2 fL (ref 80.0–100.0)
Platelets: 136 10*3/uL — ABNORMAL LOW (ref 150–400)
RBC: 4.99 MIL/uL (ref 4.22–5.81)
RDW: 14.6 % (ref 11.5–15.5)
WBC: 6.3 10*3/uL (ref 4.0–10.5)
nRBC: 0 % (ref 0.0–0.2)

## 2019-03-28 LAB — BASIC METABOLIC PANEL
Anion gap: 17 — ABNORMAL HIGH (ref 5–15)
BUN: 9 mg/dL (ref 8–23)
CO2: 20 mmol/L — ABNORMAL LOW (ref 22–32)
Calcium: 9 mg/dL (ref 8.9–10.3)
Chloride: 95 mmol/L — ABNORMAL LOW (ref 98–111)
Creatinine, Ser: 0.6 mg/dL — ABNORMAL LOW (ref 0.61–1.24)
GFR calc Af Amer: >60 mL/min (ref >60)
GFR calc non Af Amer: >60 mL/min (ref >60)
Glucose, Bld: 86 mg/dL (ref 70–99)
Potassium: 4.3 mmol/L (ref 3.5–5.1)
Sodium: 132 mmol/L — ABNORMAL LOW (ref 135–145)

## 2019-03-28 LAB — LIPASE, BLOOD: Lipase: 24 U/L (ref 11–51)

## 2019-03-28 LAB — TROPONIN I (HIGH SENSITIVITY)
Troponin I (High Sensitivity): 13 ng/L (ref <18)
Troponin I (High Sensitivity): 9 ng/L (ref <18)

## 2019-03-28 LAB — MAGNESIUM: Magnesium: 1.7 mg/dL (ref 1.7–2.4)

## 2019-03-28 LAB — ETHANOL: Alcohol, Ethyl (B): 60 mg/dL — ABNORMAL HIGH (ref <10)

## 2019-03-28 MED ORDER — SODIUM CHLORIDE 0.9 % IV BOLUS
1000.0000 mL | Freq: Once | INTRAVENOUS | Status: AC
  Administered 2019-03-28: 07:00:00 1000 mL via INTRAVENOUS

## 2019-03-28 MED ORDER — NICOTINE 21 MG/24HR TD PT24
21.0000 mg | MEDICATED_PATCH | Freq: Once | TRANSDERMAL | Status: AC
  Administered 2019-03-28: 21 mg via TRANSDERMAL
  Filled 2019-03-28: qty 1

## 2019-03-28 MED ORDER — ONDANSETRON HCL 4 MG/2ML IJ SOLN
4.0000 mg | Freq: Once | INTRAMUSCULAR | Status: AC
  Administered 2019-03-28: 4 mg via INTRAVENOUS
  Filled 2019-03-28: qty 2

## 2019-03-28 MED ORDER — CHLORDIAZEPOXIDE HCL 25 MG PO CAPS
25.0000 mg | ORAL_CAPSULE | Freq: Once | ORAL | Status: AC
  Administered 2019-03-28: 25 mg via ORAL
  Filled 2019-03-28: qty 1

## 2019-03-28 MED ORDER — LIDOCAINE VISCOUS HCL 2 % MT SOLN
15.0000 mL | Freq: Once | OROMUCOSAL | Status: AC
  Administered 2019-03-28: 09:00:00 15 mL via ORAL
  Filled 2019-03-28: qty 15

## 2019-03-28 MED ORDER — ALUM & MAG HYDROXIDE-SIMETH 200-200-20 MG/5ML PO SUSP
30.0000 mL | Freq: Once | ORAL | Status: AC
  Administered 2019-03-28: 30 mL via ORAL
  Filled 2019-03-28: qty 30

## 2019-03-28 MED ORDER — TRAZODONE HCL 50 MG PO TABS
50.0000 mg | ORAL_TABLET | Freq: Every day | ORAL | Status: DC
  Administered 2019-03-28: 50 mg via ORAL
  Filled 2019-03-28: qty 1

## 2019-03-28 MED ORDER — LORAZEPAM 2 MG PO TABS
2.0000 mg | ORAL_TABLET | Freq: Once | ORAL | Status: AC
  Administered 2019-03-28: 14:00:00 2 mg via ORAL
  Filled 2019-03-28: qty 1

## 2019-03-28 MED ORDER — SODIUM CHLORIDE 0.9% FLUSH: 3.0000 mL | Freq: Once | INTRAVENOUS | Status: DC

## 2019-03-28 MED ORDER — IOHEXOL 350 MG/ML SOLN
75.0000 mL | Freq: Once | INTRAVENOUS | Status: AC | PRN
  Administered 2019-03-28: 75 mL via INTRAVENOUS

## 2019-03-28 NOTE — ED Notes (Signed)
Report given to Jadeka RN. 

## 2019-03-28 NOTE — ED Notes (Signed)
Pt states he needs something for detox, advised I am giving him librium as ordered by the doctor. PT states "I need something to eat, nobody is helping me with anything, nobody gives a shit about me". Pt keeps reporting "I cannot go home like this". Dr. Kerman Passey informed and is now at bedside.

## 2019-03-28 NOTE — ED Notes (Signed)
Verbal order given by Dr. Kerman Passey for 21 mg nicotine patch

## 2019-03-28 NOTE — BH Assessment (Signed)
Patient's information has been faxed to the following facilities for detox treatment:   [facility]    [facility]

## 2019-03-28 NOTE — ED Notes (Signed)
Ok to d/c cardiac monitoring and move to quad per Dr. Kerman Passey

## 2019-03-28 NOTE — ED Provider Notes (Signed)
-----------------------------------------   8:52 AM on 03/28/2019 -----------------------------------------  Patient's work-up is overall reassuring, slightly elevated alcohol level.  Otherwise well-appearing labs.  CT scans of the chest abdomen and pelvis are reassuring as well.  Small cluster of pulmonary nodules in right upper lobe we will recommend follow-up with his PCP regarding this in 3 to 6 months to ensure resolution.  Patient agreeable.  Patient however states he is looking for someone to detox, does not want to be discharged home.  I discussed with the patient we cannot admit him to the hospital as there is no medical reason for admission at this time.  He is then asking for Ativan, told the patient we would not treat with Ativan at this time either as the patient's alcohol level is still elevated.  We will discuss with TTS to see if there are any detox options for the patient today.  TTS is currently working on detox placement for the patient.   Harvest Dark, MD 03/28/19 1425
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
## 2019-03-28 NOTE — BH Assessment (Addendum)
TTS spoke with this pt regarding his alcohol use. Pt is requesting detox treatment. He reports his last drink was "6-pack of beer yesterday". Pt reports experiencing withdrawals "I feel like I'm coming out of my skin".  TTS will continue to seek detox placement.

## 2019-03-28 NOTE — ED Notes (Signed)
Pt given [place] sandwich tray and sprite. Ok per Dr. Kerman Passey

## 2019-03-28 NOTE — ED Notes (Signed)
Dr. Marc Morgans informed of CIWA results, 2mg  Ativan PO now order given verbally

## 2019-03-28 NOTE — ED Notes (Signed)
Pt states he needs something to help him rest. Pt states he has taken trazadone in the past and it has helped. MD notified.

## 2019-03-28 NOTE — ED Triage Notes (Signed)
Patient to triage via wheelchair by EMS for chest pain.  EMS reports patient reported chest pressure for 3-4 days that became chest pain tonight. Pain increases with deep breath.  EMS reports: 12-lead with normal limits, HR 102, BP 146/88, pulse oxi 99% on room air, temp 98.2 (oral).

## 2019-03-28 NOTE — ED Notes (Signed)
Pt ambulatory to the bathroom with use of a cane.

## 2019-03-28 NOTE — ED Notes (Signed)
Pt requesting nicotine patch, Dr. Kerman Passey informed

## 2019-03-29 MED ORDER — CHLORDIAZEPOXIDE HCL 10 MG PO CAPS: ORAL_CAPSULE | ORAL | 0 refills | Status: DC

## 2019-03-29 MED ORDER — CHLORDIAZEPOXIDE HCL 25 MG PO CAPS
25.0000 mg | ORAL_CAPSULE | Freq: Once | ORAL | Status: AC
  Administered 2019-03-29: 25 mg via ORAL
  Filled 2019-03-29: qty 1

## 2019-03-29 MED ORDER — LORAZEPAM 1 MG PO TABS
1.0000 mg | ORAL_TABLET | Freq: Once | ORAL | Status: AC
  Administered 2019-03-29: 1 mg via ORAL
  Filled 2019-03-29: qty 1

## 2019-03-29 NOTE — ED Notes (Signed)
No signature page available on this computer at time of discharge

## 2019-03-29 NOTE — ED Notes (Signed)
He is agitated   Pt requesting  "when is somebody going to come and let me know if they have found somewhere for me to detox- go   I have been here waiting for 24 hours  - somebody ain't doing there job.  I am a senior citizen and I thought somebody would help me"   Pt reassured  Encouraged him to allow time for the counselor to come to work and they should come to see him and give him an update    He verbalized agreement   Agitation level has now calmed

## 2019-03-29 NOTE — ED Notes (Signed)
Pt given breakfast tray

## 2019-03-29 NOTE — ED Notes (Signed)
BEHAVIORAL HEALTH ROUNDING Patient sleeping: No. Patient alert and oriented: yes Behavior appropriate: Yes  - he is anxiously waiting for a detox bed .  ; If no, describe:  Nutrition and fluids offered: yes Toileting and hygiene offered: Yes  Sitter present: q15 minute observations  Law enforcement present: Yes  [employer]

## 2019-03-29 NOTE — ED Notes (Signed)
BEHAVIORAL HEALTH ROUNDING Patient sleeping: No. Patient alert and oriented: yes Behavior appropriate: Yes.  ; If no, describe:  Nutrition and fluids offered: yes Toileting and hygiene offered: Yes  Sitter present: q15 minute observations  Law enforcement present: Yes  [employer]   ENVIRONMENTAL ASSESSMENT Potentially harmful objects out of patient reach: Yes.   Personal belongings secured: Yes.   Patient dressed in hospital provided attire only: Yes.   Plastic bags out of patient reach: Yes.   Patient care equipment (cords, cables, call bells, lines, and drains) shortened, removed, or accounted for: Yes.   Equipment and supplies removed from bottom of stretcher: Yes.   Potentially toxic materials out of patient reach: Yes.   Sharps container removed or out of patient reach: Yes.   

## 2019-03-29 NOTE — ED Notes (Signed)
Pt asking if he "can go outside for fresh air." Pt made aware that if he goes outside that he will be discharged. Pt asking about bed placement.

## 2019-03-29 NOTE — ED Provider Notes (Signed)
-----------------------------------------   6:03 AM on 03/29/2019 -----------------------------------------   Blood pressure 110/66, pulse 94, temperature 98.1 F (36.7 C), temperature source Oral, resp. rate 18, height 5\' 8"  (1.727 m), weight 68.9 kg, SpO2 97 %.  The patient is sleeping at this time.  There have been no acute events since the last update.  Awaiting disposition plan from Behavioral Medicine and/or Social Work team(s).   Paulette Blanch, MD 03/29/19 [phone]

## 2019-03-29 NOTE — ED Notes (Signed)
He is awake - sitting in hallway bed  NAD observed  Report received from [place]  Pt is awaiting referral for detox placement

## 2019-03-29 NOTE — ED Provider Notes (Signed)
-----------------------------------------   11:33 AM on 03/29/2019 -----------------------------------------  Patient is requesting discharge from the emergency department as they have been unable to place him into a detox facility thus far.  He states he is serious about stopping alcohol use, we will discharge with a prescription for Librium.  Patient agreeable to plan of care.   Harvest Dark, MD 03/29/19 1133

## 2019-03-29 NOTE — ED Notes (Signed)
He is verbalizing anger and a dislike that he has waited this long and no one has spoken to him about his detox placement or other outpt resources      I discussed with him what I could see in the computer from yesterday  - no new updates in notes   Pt states  "I am disgusted - why can't people do there job?  I am disgusted - I am ready to go - get my script and let me go"

## 2019-03-29 NOTE — ED Notes (Signed)
Introduced myself to him  - assessment completed  He denies pain  Reports "I sure would like a librium for my shaking"   NAD observed  He is sitting up in bed

## 2019-03-29 NOTE — ED Notes (Signed)
ED Is the patient under IVC or is there intent for IVC:  Voluntary - he is requesting detox treatment    Is the patient medically cleared: Yes.   Is there vacancy in the ED BHU: Yes.   Is the population mix appropriate for patient:  No  No need to transfer to BHU  Pt to go to detox facility  Is the patient awaiting placement in inpatient or outpatient setting: Yes.  TTS is looking for him a place to detox  - pt awaiing them to give him information   Has the patient had a psychiatric consult: n/a Survey of unit performed for contraband, proper placement and condition of furniture, tampering with fixtures in bathroom, shower, and each patient room: Yes.  ; Findings:  APPEARANCE/BEHAVIOR Calm and cooperative NEURO ASSESSMENT Orientation: oriented x3  Denies pain Hallucinations: No.None noted (Hallucinations) denies  Speech: Normal Gait: normal RESPIRATORY ASSESSMENT Even  Unlabored respirations  CARDIOVASCULAR ASSESSMENT Pulses equal   regular rate  Skin warm and dry   GASTROINTESTINAL ASSESSMENT no GI complaint EXTREMITIES Full ROM  PLAN OF CARE Provide calm/safe environment. Vital signs assessed twice daily. ED BHU Assessment once each 12-hour shift. Collaborate with TTS if available   Assure the ED provider has rounded once each shift. Provide and encourage hygiene. Provide redirection as needed. Assess for escalating behavior; address immediately and inform ED provider.  Assess family dynamic and appropriateness for visitation as needed: Yes.  ; If necessary, describe findings:  Educate the patient/family about BHU procedures/visitation: Yes.  ; If necessary, describe findings:

## 2019-12-09 ENCOUNTER — Other Ambulatory Visit: Payer: Self-pay

## 2019-12-09 ENCOUNTER — Emergency Department: Payer: Medicare HMO

## 2019-12-09 ENCOUNTER — Inpatient Hospital Stay
Admission: EM | Admit: 2019-12-09 | Discharge: 2019-12-12 | DRG: 641 | Disposition: A | Discharge status: Home / Self Care | Payer: Medicare HMO | Attending: Internal Medicine | Admitting: Internal Medicine

## 2019-12-09 ENCOUNTER — Encounter: Payer: Self-pay | Admitting: Emergency Medicine

## 2019-12-09 DIAGNOSIS — Z720 Tobacco use: Secondary | ICD-10-CM | POA: Diagnosis not present

## 2019-12-09 DIAGNOSIS — I10 Essential (primary) hypertension: Secondary | ICD-10-CM | POA: Diagnosis not present

## 2019-12-09 DIAGNOSIS — R079 Chest pain, unspecified: Secondary | ICD-10-CM | POA: Diagnosis not present

## 2019-12-09 DIAGNOSIS — D696 Thrombocytopenia, unspecified: Secondary | ICD-10-CM | POA: Diagnosis present

## 2019-12-09 DIAGNOSIS — F32A Depression, unspecified: Secondary | ICD-10-CM

## 2019-12-09 DIAGNOSIS — Z7901 Long term (current) use of anticoagulants: Secondary | ICD-10-CM

## 2019-12-09 DIAGNOSIS — E8729 Other acidosis: Secondary | ICD-10-CM

## 2019-12-09 DIAGNOSIS — F418 Other specified anxiety disorders: Secondary | ICD-10-CM

## 2019-12-09 DIAGNOSIS — F10230 Alcohol dependence with withdrawal, uncomplicated: Secondary | ICD-10-CM | POA: Diagnosis not present

## 2019-12-09 DIAGNOSIS — F10239 Alcohol dependence with withdrawal, unspecified: Secondary | ICD-10-CM | POA: Diagnosis present

## 2019-12-09 DIAGNOSIS — F419 Anxiety disorder, unspecified: Secondary | ICD-10-CM | POA: Diagnosis present

## 2019-12-09 DIAGNOSIS — Z888 Allergy status to other drugs, medicaments and biological substances status: Secondary | ICD-10-CM

## 2019-12-09 DIAGNOSIS — R0600 Dyspnea, unspecified: Secondary | ICD-10-CM

## 2019-12-09 DIAGNOSIS — Z79899 Other long term (current) drug therapy: Secondary | ICD-10-CM

## 2019-12-09 DIAGNOSIS — Z885 Allergy status to narcotic agent status: Secondary | ICD-10-CM

## 2019-12-09 DIAGNOSIS — Z20822 Contact with and (suspected) exposure to covid-19: Secondary | ICD-10-CM | POA: Diagnosis present

## 2019-12-09 DIAGNOSIS — F329 Major depressive disorder, single episode, unspecified: Secondary | ICD-10-CM | POA: Diagnosis present

## 2019-12-09 DIAGNOSIS — F1010 Alcohol abuse, uncomplicated: Secondary | ICD-10-CM | POA: Diagnosis not present

## 2019-12-09 DIAGNOSIS — Z811 Family history of alcohol abuse and dependence: Secondary | ICD-10-CM | POA: Diagnosis not present

## 2019-12-09 DIAGNOSIS — R05 Cough: Secondary | ICD-10-CM | POA: Diagnosis not present

## 2019-12-09 DIAGNOSIS — N189 Chronic kidney disease, unspecified: Secondary | ICD-10-CM | POA: Diagnosis present

## 2019-12-09 DIAGNOSIS — E872 Acidosis: Principal | ICD-10-CM

## 2019-12-09 DIAGNOSIS — R0789 Other chest pain: Secondary | ICD-10-CM | POA: Diagnosis not present

## 2019-12-09 DIAGNOSIS — R059 Cough, unspecified: Secondary | ICD-10-CM | POA: Insufficient documentation

## 2019-12-09 DIAGNOSIS — I129 Hypertensive chronic kidney disease with stage 1 through stage 4 chronic kidney disease, or unspecified chronic kidney disease: Secondary | ICD-10-CM | POA: Diagnosis present

## 2019-12-09 DIAGNOSIS — K219 Gastro-esophageal reflux disease without esophagitis: Secondary | ICD-10-CM | POA: Diagnosis not present

## 2019-12-09 DIAGNOSIS — Z86718 Personal history of other venous thrombosis and embolism: Secondary | ICD-10-CM | POA: Diagnosis not present

## 2019-12-09 DIAGNOSIS — R1013 Epigastric pain: Secondary | ICD-10-CM

## 2019-12-09 DIAGNOSIS — J111 Influenza due to unidentified influenza virus with other respiratory manifestations: Secondary | ICD-10-CM

## 2019-12-09 DIAGNOSIS — F17210 Nicotine dependence, cigarettes, uncomplicated: Secondary | ICD-10-CM | POA: Diagnosis present

## 2019-12-09 DIAGNOSIS — R1084 Generalized abdominal pain: Secondary | ICD-10-CM

## 2019-12-09 LAB — CBC WITH DIFFERENTIAL/PLATELET
Abs Immature Granulocytes: 0.15 10*3/uL — ABNORMAL HIGH (ref 0.00–0.07)
Basophils Absolute: 0 10*3/uL (ref 0.0–0.1)
Basophils Relative: 0 %
Eosinophils Absolute: 0 10*3/uL (ref 0.0–0.5)
Eosinophils Relative: 0 %
HCT: 45.8 % (ref 39.0–52.0)
Hemoglobin: 15 g/dL (ref 13.0–17.0)
Immature Granulocytes: 1 %
Lymphocytes Relative: 6 %
Lymphs Abs: 1.1 10*3/uL (ref 0.7–4.0)
MCH: 29.9 pg (ref 26.0–34.0)
MCHC: 32.8 g/dL (ref 30.0–36.0)
MCV: 91.2 fL (ref 80.0–100.0)
Monocytes Absolute: 1.4 10*3/uL — ABNORMAL HIGH (ref 0.1–1.0)
Monocytes Relative: 8 %
Neutro Abs: 15.9 10*3/uL — ABNORMAL HIGH (ref 1.7–7.7)
Neutrophils Relative %: 85 %
Platelets: 238 10*3/uL (ref 150–400)
RBC: 5.02 MIL/uL (ref 4.22–5.81)
RDW: 14.9 % (ref 11.5–15.5)
WBC: 18.6 10*3/uL — ABNORMAL HIGH (ref 4.0–10.5)
nRBC: 0 % (ref 0.0–0.2)

## 2019-12-09 LAB — COMPREHENSIVE METABOLIC PANEL
ALT: 18 U/L (ref 0–44)
AST: 45 U/L — ABNORMAL HIGH (ref 15–41)
Albumin: 4.4 g/dL (ref 3.5–5.0)
Alkaline Phosphatase: 65 U/L (ref 38–126)
Anion gap: 25 — ABNORMAL HIGH (ref 5–15)
BUN: 25 mg/dL — ABNORMAL HIGH (ref 8–23)
CO2: 11 mmol/L — ABNORMAL LOW (ref 22–32)
Calcium: 8.8 mg/dL — ABNORMAL LOW (ref 8.9–10.3)
Chloride: 97 mmol/L — ABNORMAL LOW (ref 98–111)
Creatinine, Ser: 1.12 mg/dL (ref 0.61–1.24)
GFR calc Af Amer: >60 mL/min (ref >60)
GFR calc non Af Amer: >60 mL/min (ref >60)
Glucose, Bld: 88 mg/dL (ref 70–99)
Potassium: 5 mmol/L (ref 3.5–5.1)
Sodium: 133 mmol/L — ABNORMAL LOW (ref 135–145)
Total Bilirubin: 1.4 mg/dL — ABNORMAL HIGH (ref 0.3–1.2)
Total Protein: 7.5 g/dL (ref 6.5–8.1)

## 2019-12-09 LAB — RESPIRATORY PANEL BY RT PCR (FLU A&B, COVID)
Influenza A by PCR: NEGATIVE
Influenza B by PCR: NEGATIVE
SARS Coronavirus 2 by RT PCR: NEGATIVE

## 2019-12-09 LAB — URINALYSIS, COMPLETE (UACMP) WITH MICROSCOPIC
Bacteria, UA: NONE SEEN
Bilirubin Urine: NEGATIVE
Glucose, UA: NEGATIVE mg/dL
Hgb urine dipstick: NEGATIVE
Ketones, ur: 20 mg/dL — AB
Leukocytes,Ua: NEGATIVE
Nitrite: NEGATIVE
Protein, ur: NEGATIVE mg/dL
Specific Gravity, Urine: >1.046 — ABNORMAL HIGH (ref 1.005–1.030)
pH: 6 (ref 5.0–8.0)

## 2019-12-09 LAB — LIPASE, BLOOD: Lipase: 20 U/L (ref 11–51)

## 2019-12-09 LAB — MAGNESIUM: Magnesium: 2.2 mg/dL (ref 1.7–2.4)

## 2019-12-09 LAB — TROPONIN I (HIGH SENSITIVITY)
Troponin I (High Sensitivity): 15 ng/L (ref <18)
Troponin I (High Sensitivity): 25 ng/L — ABNORMAL HIGH (ref <18)

## 2019-12-09 LAB — PROCALCITONIN: Procalcitonin: 0.16 ng/mL

## 2019-12-09 LAB — LACTIC ACID, PLASMA
Lactic Acid, Venous: 2.6 mmol/L (ref 0.5–1.9)
Lactic Acid, Venous: 2 mmol/L (ref 0.5–1.9)

## 2019-12-09 LAB — CK: Total CK: 61 U/L (ref 49–397)

## 2019-12-09 MED ORDER — THIAMINE HCL 100 MG/ML IJ SOLN
Freq: Once | INTRAVENOUS | Status: AC
  Filled 2019-12-09: qty 1000

## 2019-12-09 MED ORDER — THIAMINE HCL 100 MG PO TABS
100.0000 mg | ORAL_TABLET | Freq: Every day | ORAL | Status: DC
  Administered 2019-12-09 – 2019-12-12 (×4): 100 mg via ORAL
  Filled 2019-12-09 (×4): qty 1

## 2019-12-09 MED ORDER — GABAPENTIN 300 MG PO CAPS
300.0000 mg | ORAL_CAPSULE | Freq: Three times a day (TID) | ORAL | Status: DC
  Administered 2019-12-10 – 2019-12-12 (×8): 300 mg via ORAL
  Filled 2019-12-09 (×5): qty 1
  Filled 2019-12-09: qty 3
  Filled 2019-12-09 (×2): qty 1

## 2019-12-09 MED ORDER — THIAMINE HCL 100 MG/ML IJ SOLN: 100.0000 mg | Freq: Every day | INTRAMUSCULAR | Status: DC

## 2019-12-09 MED ORDER — ONDANSETRON HCL 4 MG/2ML IJ SOLN
4.0000 mg | Freq: Once | INTRAMUSCULAR | Status: AC
  Administered 2019-12-09: 4 mg via INTRAVENOUS
  Filled 2019-12-09: qty 2

## 2019-12-09 MED ORDER — SODIUM CHLORIDE 0.9 % IV SOLN: Freq: Once | INTRAVENOUS | Status: DC

## 2019-12-09 MED ORDER — IOHEXOL 350 MG/ML SOLN
100.0000 mL | Freq: Once | INTRAVENOUS | Status: AC | PRN
  Administered 2019-12-09: 100 mL via INTRAVENOUS

## 2019-12-09 MED ORDER — SODIUM CHLORIDE 0.9 % IV SOLN: INTRAVENOUS | Status: DC

## 2019-12-09 MED ORDER — FOLIC ACID 1 MG PO TABS
1.0000 mg | ORAL_TABLET | Freq: Every day | ORAL | Status: DC
  Administered 2019-12-09 – 2019-12-12 (×4): 1 mg via ORAL
  Filled 2019-12-09 (×4): qty 1

## 2019-12-09 MED ORDER — DOXAZOSIN MESYLATE 8 MG PO TABS
8.0000 mg | ORAL_TABLET | Freq: Every day | ORAL | Status: DC
  Administered 2019-12-11: 8 mg via ORAL
  Filled 2019-12-09 (×4): qty 1

## 2019-12-09 MED ORDER — KETOROLAC TROMETHAMINE 30 MG/ML IJ SOLN
15.0000 mg | INTRAMUSCULAR | Status: AC
  Administered 2019-12-09: 15 mg via INTRAVENOUS
  Filled 2019-12-09: qty 1

## 2019-12-09 MED ORDER — FAMOTIDINE IN NACL 20-0.9 MG/50ML-% IV SOLN
20.0000 mg | Freq: Once | INTRAVENOUS | Status: AC
  Administered 2019-12-09: 20 mg via INTRAVENOUS
  Filled 2019-12-09: qty 50

## 2019-12-09 MED ORDER — PANTOPRAZOLE SODIUM 40 MG IV SOLR
40.0000 mg | Freq: Two times a day (BID) | INTRAVENOUS | Status: DC
  Administered 2019-12-10 – 2019-12-12 (×6): 40 mg via INTRAVENOUS
  Filled 2019-12-09 (×6): qty 40

## 2019-12-09 MED ORDER — PANTOPRAZOLE SODIUM 40 MG IV SOLR
40.0000 mg | Freq: Once | INTRAVENOUS | Status: AC
  Administered 2019-12-09: 40 mg via INTRAVENOUS
  Filled 2019-12-09: qty 40

## 2019-12-09 MED ORDER — ONDANSETRON HCL 4 MG/2ML IJ SOLN: 4.0000 mg | Freq: Four times a day (QID) | INTRAMUSCULAR | Status: DC | PRN

## 2019-12-09 MED ORDER — IPRATROPIUM-ALBUTEROL 0.5-2.5 (3) MG/3ML IN SOLN
3.0000 mL | Freq: Four times a day (QID) | RESPIRATORY_TRACT | Status: DC
  Administered 2019-12-10 – 2019-12-11 (×4): 3 mL via RESPIRATORY_TRACT
  Filled 2019-12-09 (×8): qty 3

## 2019-12-09 MED ORDER — NICOTINE 21 MG/24HR TD PT24
21.0000 mg | MEDICATED_PATCH | Freq: Every day | TRANSDERMAL | Status: DC
  Administered 2019-12-09 – 2019-12-11 (×3): 21 mg via TRANSDERMAL
  Filled 2019-12-09 (×4): qty 1

## 2019-12-09 MED ORDER — SODIUM CHLORIDE 0.9 % IV BOLUS
1000.0000 mL | Freq: Once | INTRAVENOUS | Status: AC
  Administered 2019-12-09: 1000 mL via INTRAVENOUS

## 2019-12-09 MED ORDER — ACETAMINOPHEN 325 MG PO TABS
650.0000 mg | ORAL_TABLET | Freq: Four times a day (QID) | ORAL | Status: DC | PRN
  Administered 2019-12-10: 650 mg via ORAL
  Filled 2019-12-09: qty 2

## 2019-12-09 MED ORDER — ACETAMINOPHEN 650 MG RE SUPP: 650.0000 mg | Freq: Four times a day (QID) | RECTAL | Status: DC | PRN

## 2019-12-09 MED ORDER — LORAZEPAM 2 MG/ML IJ SOLN
1.0000 mg | INTRAMUSCULAR | Status: DC | PRN
  Administered 2019-12-11: 2 mg via INTRAVENOUS
  Filled 2019-12-09: qty 1

## 2019-12-09 MED ORDER — AMLODIPINE BESYLATE 5 MG PO TABS
5.0000 mg | ORAL_TABLET | Freq: Every evening | ORAL | Status: DC
  Administered 2019-12-09 – 2019-12-11 (×3): 5 mg via ORAL
  Filled 2019-12-09 (×3): qty 1

## 2019-12-09 MED ORDER — TRAZODONE HCL 100 MG PO TABS
100.0000 mg | ORAL_TABLET | Freq: Every day | ORAL | Status: DC
  Administered 2019-12-10 – 2019-12-11 (×3): 100 mg via ORAL
  Filled 2019-12-09 (×3): qty 1

## 2019-12-09 MED ORDER — SERTRALINE HCL 50 MG PO TABS
50.0000 mg | ORAL_TABLET | Freq: Every day | ORAL | Status: DC
  Administered 2019-12-10 – 2019-12-12 (×3): 50 mg via ORAL
  Filled 2019-12-09 (×3): qty 1

## 2019-12-09 MED ORDER — ADULT MULTIVITAMIN W/MINERALS CH
1.0000 | ORAL_TABLET | Freq: Every day | ORAL | Status: DC
  Administered 2019-12-09 – 2019-12-12 (×4): 1 via ORAL
  Filled 2019-12-09 (×4): qty 1

## 2019-12-09 MED ORDER — LACTATED RINGERS IV BOLUS
1000.0000 mL | Freq: Once | INTRAVENOUS | Status: AC
  Administered 2019-12-09: 1000 mL via INTRAVENOUS

## 2019-12-09 MED ORDER — LORAZEPAM 1 MG PO TABS: 1.0000 mg | ORAL_TABLET | ORAL | Status: DC | PRN

## 2019-12-09 MED ORDER — HYDROCODONE-ACETAMINOPHEN 5-325 MG PO TABS
1.0000 | ORAL_TABLET | Freq: Three times a day (TID) | ORAL | Status: DC | PRN
  Administered 2019-12-09 – 2019-12-10 (×2): 1 via ORAL
  Filled 2019-12-09 (×2): qty 1

## 2019-12-09 NOTE — ED Notes (Signed)
Pt given sprite and sandwich tray 

## 2019-12-09 NOTE — ED Provider Notes (Signed)
Assumed care from Dr. Scotty Court at 3 PM. Briefly, the patient is a 69 y.o. male with PMHx of  has a past medical history of Anxiety, Chronic kidney disease, Degenerative disc disease, lumbar, Exposure to hepatitis C, GERD (gastroesophageal reflux disease), Headache, History of gallstones, Hypertension, and Pneumonia (approx 15 years ago). here with abdominal pain, nausea, vomiting, cough, and chest pain. Pt with significant leukocytosis, dehydration on arrival with AGMA. Suspect alcoholic ketosis based on history. No fever, VSS, doubt bacterial sepsis. CT Chest, A/P obtained and overall unremarkable. +Gallstones but no RUQ TTP, and LFTs/Bili are @ baseline, doubt cholecystitis. Will f/u after fluids, COVID, procal..   Labs Reviewed  CBC WITH DIFFERENTIAL/PLATELET - Abnormal; Notable for the following components:      Result Value   WBC 18.6 (*)    Neutro Abs 15.9 (*)    Monocytes Absolute 1.4 (*)    Abs Immature Granulocytes 0.15 (*)    All other components within normal limits  COMPREHENSIVE METABOLIC PANEL - Abnormal; Notable for the following components:   Sodium 133 (*)    Chloride 97 (*)    CO2 11 (*)    BUN 25 (*)    Calcium 8.8 (*)    AST 45 (*)    Total Bilirubin 1.4 (*)    Anion gap 25 (*)    All other components within normal limits  LACTIC ACID, PLASMA - Abnormal; Notable for the following components:   Lactic Acid, Venous 2.6 (*)    All other components within normal limits  TROPONIN I (HIGH SENSITIVITY) - Abnormal; Notable for the following components:   Troponin I (High Sensitivity) 25 (*)    All other components within normal limits  CULTURE, BLOOD (SINGLE)  URINE CULTURE  RESPIRATORY PANEL BY RT PCR (FLU A&B, COVID)  LIPASE, BLOOD  CK  LACTIC ACID, PLASMA  URINALYSIS, COMPLETE (UACMP) WITH MICROSCOPIC  PROCALCITONIN  TROPONIN I (HIGH SENSITIVITY)    Course of Care: -Pt with persistent n/v after IVF, antiemetics. Labs as above. COVID neg. Given persistent n/v with  AGMA, admit for IVF. IV MVI, thiamine folic acid started.     Shaune Pollack, MD 12/09/19 [phone]

## 2019-12-09 NOTE — ED Triage Notes (Signed)
Pt to triage via w/c with no distress noted; EMS brought pt in from home for [facility] and left sided CP x 2 days wit N/V; st prod cough green sputum

## 2019-12-09 NOTE — ED Notes (Signed)
Pt given sherbet icecream
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
## 2019-12-10 DIAGNOSIS — F1010 Alcohol abuse, uncomplicated: Secondary | ICD-10-CM

## 2019-12-10 DIAGNOSIS — F329 Major depressive disorder, single episode, unspecified: Secondary | ICD-10-CM

## 2019-12-10 DIAGNOSIS — R1013 Epigastric pain: Secondary | ICD-10-CM

## 2019-12-10 DIAGNOSIS — K219 Gastro-esophageal reflux disease without esophagitis: Secondary | ICD-10-CM

## 2019-12-10 DIAGNOSIS — R0789 Other chest pain: Secondary | ICD-10-CM

## 2019-12-10 DIAGNOSIS — F419 Anxiety disorder, unspecified: Secondary | ICD-10-CM

## 2019-12-10 LAB — CBC
HCT: 35.5 % — ABNORMAL LOW (ref 39.0–52.0)
Hemoglobin: 11.7 g/dL — ABNORMAL LOW (ref 13.0–17.0)
MCH: 29.8 pg (ref 26.0–34.0)
MCHC: 33 g/dL (ref 30.0–36.0)
MCV: 90.3 fL (ref 80.0–100.0)
Platelets: 144 10*3/uL — ABNORMAL LOW (ref 150–400)
RBC: 3.93 MIL/uL — ABNORMAL LOW (ref 4.22–5.81)
RDW: 14.5 % (ref 11.5–15.5)
WBC: 9.5 10*3/uL (ref 4.0–10.5)
nRBC: 0 % (ref 0.0–0.2)

## 2019-12-10 LAB — BASIC METABOLIC PANEL
Anion gap: 8 (ref 5–15)
BUN: 13 mg/dL (ref 8–23)
CO2: 27 mmol/L (ref 22–32)
Calcium: 8.8 mg/dL — ABNORMAL LOW (ref 8.9–10.3)
Chloride: 104 mmol/L (ref 98–111)
Creatinine, Ser: 0.78 mg/dL (ref 0.61–1.24)
GFR calc Af Amer: >60 mL/min (ref >60)
GFR calc non Af Amer: >60 mL/min (ref >60)
Glucose, Bld: 110 mg/dL — ABNORMAL HIGH (ref 70–99)
Potassium: 4.2 mmol/L (ref 3.5–5.1)
Sodium: 139 mmol/L (ref 135–145)

## 2019-12-10 LAB — HIV ANTIBODY (ROUTINE TESTING W REFLEX): HIV Screen 4th Generation wRfx: NONREACTIVE

## 2019-12-10 MED ORDER — HYDROCODONE-ACETAMINOPHEN 5-325 MG PO TABS
1.0000 | ORAL_TABLET | ORAL | Status: DC | PRN
  Administered 2019-12-10 – 2019-12-12 (×6): 1 via ORAL
  Filled 2019-12-10 (×6): qty 1

## 2019-12-10 MED ORDER — KETOROLAC TROMETHAMINE 15 MG/ML IJ SOLN
15.0000 mg | Freq: Once | INTRAMUSCULAR | Status: AC
  Administered 2019-12-10: 15 mg via INTRAVENOUS
  Filled 2019-12-10: qty 1

## 2019-12-10 NOTE — TOC Progression Note (Signed)
Transition of Care [facility]) - Progression Note    Patient Details  Name: Chad [name] MRN: [id number] Date of Birth: [birth year]  Transition [facility] CM/SW Contact  Ashley Royalty Lutricia Feil, RN Phone Number: 12/10/2019, 1:20 PM  Clinical Narrative:     RN at bedside and provided Substance Abuse Counseling/Education with this pt and local resources for ongoing counseling. Pt receptive and indicated he had some information on these available services but receptive to receive once again. Pt slightly irritated due to he "wants to eat something" but understands the delay.   Pt states he will return home to a room where he stays, lives alone with no mention of support system when inquired. Pt indicated he may need a taxi voucher upon his discharge with no one to assist to route him to his place of residence. No other needs or barriers to address at this time.  TOC team will continue to follow until discharged.    Expected Discharge Plan: Home/Self Care Barriers to Discharge: No Barriers Identified  Expected Discharge Plan and Services Expected Discharge Plan: Home/Self Care In-house Referral: Clinical Social Work Discharge Planning Services: CM Consult Post Acute Care Choice: NA Living arrangements for the past 2 months: Boarding House (States he stays in a room and lives alone.)                                       Social Determinants of Health (SDOH) Interventions    Readmission Risk Interventions No flowsheet data found.
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
## 2019-12-10 NOTE — Progress Notes (Signed)
Patient ID: Chad [name], male   DOB: November 27, 1950, 69 y.o.   MRN: [id number] [facility] PROGRESS NOTE  Chad [name] XAJ:[id number] DOB: 1950-04-23 DOA: 12/09/2019 PCP: Chad Humphrey, MD  HPI/Subjective: Patient still have a lot of chest and abdominal pain this morning 7 out of 10 in intensity.  Some hiccups.  Pain to palpation over the chest wall.  Some diarrhea.  Objective: Vitals:   12/10/19 0909 12/10/19 1355  BP:  (!) 143/74  Pulse:  63  Resp:  20  Temp:  98.7 F (37.1 C)  SpO2: 93% 97%    Intake/Output Summary (Last 24 hours) at 12/10/2019 1620 Last data filed at 12/10/2019 1500 Gross per 24 hour  Intake 300 ml  Output 975 ml  Net -675 ml   Filed Weights   12/09/19 0356 12/09/19 2344  Weight: 68 kg 73.4 kg    ROS: Review of Systems  Respiratory: Positive for cough and shortness of breath.   Cardiovascular: Positive for chest pain.  Gastrointestinal: Positive for abdominal pain, nausea and vomiting.   Exam: Physical Exam HENT:     Nose: No mucosal edema.     Mouth/Throat:     Pharynx: No oropharyngeal exudate.  Eyes:     General: Lids are normal.     Conjunctiva/sclera: Conjunctivae normal.     Pupils: Pupils are equal, round, and reactive to light.  Cardiovascular:     Rate and Rhythm: Normal rate and regular rhythm.     Heart sounds: Normal heart sounds, S1 normal and S2 normal.  Pulmonary:     Breath sounds: No decreased breath sounds, wheezing, rhonchi or rales.  Chest:     Comments: Pain to palpation over chest. Abdominal:     Palpations: Abdomen is soft.     Tenderness: There is abdominal tenderness in the epigastric area.  Musculoskeletal:     Right lower leg: No swelling.     Left lower leg: No swelling.  Skin:    General: Skin is warm.     Findings: No rash.  Neurological:     Mental Status: He is alert and oriented to person, place, and time.     Comments: Slight tremor       Data Reviewed: Basic Metabolic Panel: Recent  Labs  Lab 12/09/19 0359 12/09/19 1818 12/10/19 0424  NA 133*  --  139  K 5.0  --  4.2  CL 97*  --  104  CO2 11*  --  27  GLUCOSE 88  --  110*  BUN 25*  --  13  CREATININE 1.12  --  0.78  CALCIUM 8.8*  --  8.8*  MG  --  2.2  --    Liver Function Tests: Recent Labs  Lab 12/09/19 0359  AST 45*  ALT 18  ALKPHOS 65  BILITOT 1.4*  PROT 7.5  ALBUMIN 4.4   Recent Labs  Lab 12/09/19 0359  LIPASE 20   CBC: Recent Labs  Lab 12/09/19 0359 12/10/19 0424  WBC 18.6* 9.5  NEUTROABS 15.9*  --   HGB 15.0 11.7*  HCT 45.8 35.5*  MCV 91.2 90.3  PLT 238 144*   Cardiac Enzymes: Recent Labs  Lab 12/09/19 0644  CKTOTAL 61     Recent Results (from the past 240 hour(s))  Blood culture (routine single)     Status: None (Preliminary result)   Collection Time: 12/09/19  3:00 PM   Specimen: BLOOD  Result Value Ref Range Status   Specimen Description BLOOD  LEFT ANTECUBITAL  Final   Special Requests   Final    BOTTLES DRAWN AEROBIC AND ANAEROBIC Blood Culture adequate volume   Culture   Final    NO GROWTH < 24 HOURS Performed at [facility], [address] [place], [place] [address]    Report Status PENDING  Incomplete  Respiratory Panel by RT PCR (Flu A&B, Covid) - Nasopharyngeal Swab     Status: None   Collection Time: 12/09/19  3:00 PM   Specimen: Nasopharyngeal Swab  Result Value Ref Range Status   SARS Coronavirus 2 by RT PCR NEGATIVE NEGATIVE Final    Comment: (NOTE) SARS-CoV-2 target nucleic acids are NOT DETECTED.  The SARS-CoV-2 RNA is generally detectable in upper respiratoy specimens during the acute phase of infection. The lowest concentration of SARS-CoV-2 viral copies this assay can detect is 131 copies/mL. A negative result does not preclude SARS-Cov-2 infection and should not be used as the sole basis for treatment or other patient management decisions. A negative result may occur with  improper specimen collection/handling, submission of  specimen other than nasopharyngeal swab, presence of viral mutation(s) within the areas targeted by this assay, and inadequate number of viral copies (<131 copies/mL). A negative result must be combined with clinical observations, patient history, and epidemiological information. The expected result is Negative.  Fact Sheet for Patients:  https://www.moore.com/  Fact Sheet for Healthcare Providers:  https://www.young.biz/  This test is no t yet approved or cleared by the [place] FDA and  has been authorized for detection and/or diagnosis of SARS-CoV-2 by FDA under an Emergency Use Authorization (EUA). This EUA will remain  in effect (meaning this test can be used) for the duration of the COVID-19 declaration under Section 564(b)(1) of the Act, 21 U.S.C. section 360bbb-3(b)(1), unless the authorization is terminated or revoked sooner.     Influenza A by PCR NEGATIVE NEGATIVE Final   Influenza B by PCR NEGATIVE NEGATIVE Final    Comment: (NOTE) The Xpert Xpress SARS-CoV-2/FLU/RSV assay is intended as an aid in  the diagnosis of influenza from Nasopharyngeal swab specimens and  should not be used as a sole basis for treatment. Nasal washings and  aspirates are unacceptable for Xpert Xpress SARS-CoV-2/FLU/RSV  testing.  Fact Sheet for Patients: https://www.moore.com/  Fact Sheet for Healthcare Providers: https://www.young.biz/  This test is not yet approved or cleared by the [place] FDA and  has been authorized for detection and/or diagnosis of SARS-CoV-2 by  FDA under an Emergency Use Authorization (EUA). This EUA will remain  in effect (meaning this test can be used) for the duration of the  Covid-19 declaration under Section 564(b)(1) of the Act, 21  U.S.C. section 360bbb-3(b)(1), unless the authorization is  terminated or revoked. Performed at [facility], [address] [place], [place] [address]      Studies: DG Chest 2 View  Result Date: 12/09/2019 CLINICAL DATA:  Left-sided chest pain for 2 days EXAM: CHEST - 2 VIEW COMPARISON:  03/28/2019 FINDINGS: Normal heart size and mediastinal contours. Stable lung markings when compared with before. No acute infiltrate or edema. No effusion or pneumothorax. Spondylosis. No acute osseous findings. IMPRESSION: No active cardiopulmonary disease. Electronically Signed   By: Marnee Spring M.D.   On: 12/09/2019 04:41   CT Angio Chest PE W and/or Wo Contrast  Result Date: 12/09/2019 CLINICAL DATA:  Generalized abdominal pain, nausea and vomiting, diarrhea, shortness of breath, dyspnea on exertion, productive cough, history of DVT EXAM: CT  ANGIOGRAPHY CHEST CT ABDOMEN AND PELVIS WITH CONTRAST TECHNIQUE: Multidetector CT imaging of the chest was performed using the standard protocol during bolus administration of intravenous contrast. Multiplanar CT image reconstructions and MIPs were obtained to evaluate the vascular anatomy. Multidetector CT imaging of the abdomen and pelvis was performed using the standard protocol during bolus administration of intravenous contrast. CONTRAST:  OMNIPAQUE IOHEXOL 350 MG/ML SOLN COMPARISON:  03/28/2019 FINDINGS: CTA CHEST FINDINGS Cardiovascular: This is a technically adequate evaluation of the pulmonary vasculature. No filling defects or pulmonary emboli. The heart is unremarkable without pericardial effusion. Normal caliber of the thoracic aorta. Minimal atherosclerosis. Mediastinum/Nodes: No enlarged mediastinal, hilar, or axillary lymph nodes. Thyroid gland, trachea, and esophagus demonstrate no significant findings. Lungs/Pleura: Mild upper lobe predominant emphysema. No airspace disease, effusion, or pneumothorax. Minimal subpleural scarring within the right upper lobe along the major fissure. Central airways are patent. Musculoskeletal: No acute or destructive bony lesions. Reconstructed  images demonstrate no additional findings. Review of the MIP images confirms the above findings. CT ABDOMEN and PELVIS FINDINGS Hepatobiliary: There is diffuse hepatic steatosis without focal abnormality. No biliary dilation. Calcified gallstones are identified without cholecystitis. Pancreas: Unremarkable. No pancreatic ductal dilatation or surrounding inflammatory changes. Spleen: Normal in size without focal abnormality. Adrenals/Urinary Tract: Adrenal glands are unremarkable. Kidneys are normal, without renal calculi, focal lesion, or hydronephrosis. Bladder is unremarkable. Stomach/Bowel: No bowel obstruction or ileus. There is diffuse diverticulosis of the distal descending and sigmoid colon. No wall thickening or inflammatory change to suggest diverticulitis. The appendix, if still present, is not well visualized. Vascular/Lymphatic: Aortic atherosclerosis. No enlarged abdominal or pelvic lymph nodes. Reproductive: Prostate is unremarkable. Other: No free fluid or free gas. No abdominal wall hernia. Musculoskeletal: No acute or destructive bony lesions. Reconstructed images demonstrate no additional findings. Review of the MIP images confirms the above findings. IMPRESSION: 1. No evidence of pulmonary embolus. 2. Cholelithiasis without cholecystitis. 3. Diffuse hepatic steatosis. 4. Diverticulosis without diverticulitis. 5. Aortic Atherosclerosis (ICD10-I70.0) and Emphysema (ICD10-J43.9). Electronically Signed   By: Sharlet Salina M.D.   On: 12/09/2019 15:33   CT ABDOMEN PELVIS W CONTRAST  Result Date: 12/09/2019 CLINICAL DATA:  Generalized abdominal pain, nausea and vomiting, diarrhea, shortness of breath, dyspnea on exertion, productive cough, history of DVT EXAM: CT ANGIOGRAPHY CHEST CT ABDOMEN AND PELVIS WITH CONTRAST TECHNIQUE: Multidetector CT imaging of the chest was performed using the standard protocol during bolus administration of intravenous contrast. Multiplanar CT image reconstructions and  MIPs were obtained to evaluate the vascular anatomy. Multidetector CT imaging of the abdomen and pelvis was performed using the standard protocol during bolus administration of intravenous contrast. CONTRAST:  OMNIPAQUE IOHEXOL 350 MG/ML SOLN COMPARISON:  03/28/2019 FINDINGS: CTA CHEST FINDINGS Cardiovascular: This is a technically adequate evaluation of the pulmonary vasculature. No filling defects or pulmonary emboli. The heart is unremarkable without pericardial effusion. Normal caliber of the thoracic aorta. Minimal atherosclerosis. Mediastinum/Nodes: No enlarged mediastinal, hilar, or axillary lymph nodes. Thyroid gland, trachea, and esophagus demonstrate no significant findings. Lungs/Pleura: Mild upper lobe predominant emphysema. No airspace disease, effusion, or pneumothorax. Minimal subpleural scarring within the right upper lobe along the major fissure. Central airways are patent. Musculoskeletal: No acute or destructive bony lesions. Reconstructed images demonstrate no additional findings. Review of the MIP images confirms the above findings. CT ABDOMEN and PELVIS FINDINGS Hepatobiliary: There is diffuse hepatic steatosis without focal abnormality. No biliary dilation. Calcified gallstones are identified without cholecystitis. Pancreas: Unremarkable. No pancreatic ductal dilatation or surrounding inflammatory changes.  Spleen: Normal in size without focal abnormality. Adrenals/Urinary Tract: Adrenal glands are unremarkable. Kidneys are normal, without renal calculi, focal lesion, or hydronephrosis. Bladder is unremarkable. Stomach/Bowel: No bowel obstruction or ileus. There is diffuse diverticulosis of the distal descending and sigmoid colon. No wall thickening or inflammatory change to suggest diverticulitis. The appendix, if still present, is not well visualized. Vascular/Lymphatic: Aortic atherosclerosis. No enlarged abdominal or pelvic lymph nodes. Reproductive: Prostate is unremarkable. Other:  No free fluid or free gas. No abdominal wall hernia. Musculoskeletal: No acute or destructive bony lesions. Reconstructed images demonstrate no additional findings. Review of the MIP images confirms the above findings. IMPRESSION: 1. No evidence of pulmonary embolus. 2. Cholelithiasis without cholecystitis. 3. Diffuse hepatic steatosis. 4. Diverticulosis without diverticulitis. 5. Aortic Atherosclerosis (ICD10-I70.0) and Emphysema (ICD10-J43.9). Electronically Signed   By: Sharlet SalinaMichael  Brown M.D.   On: 12/09/2019 15:33    Scheduled Meds: . amLODipine  5 mg Oral QPM  . doxazosin  8 mg Oral QHS  . folic acid  1 mg Oral Daily  . gabapentin  300 mg Oral TID  . ipratropium-albuterol  3 mL Nebulization Q6H  . ketorolac  15 mg Intravenous Once  . multivitamin with minerals  1 tablet Oral Daily  . nicotine  21 mg Transdermal Daily  . pantoprazole (PROTONIX) IV  40 mg Intravenous Q12H  . sertraline  50 mg Oral Daily  . thiamine  100 mg Oral Daily   Or  . thiamine  100 mg Intravenous Daily  . traZODone  100 mg Oral QHS   Continuous Infusions: . sodium chloride 50 mL/hr at 12/10/19 1224    Assessment/Plan:  1. Alcoholic ketoacidosis improved with IV fluid hydration.  Volatile's sent off but still pending. 2. Chest pain and epigastric pain with nausea vomiting.  Continue Protonix 40 mg IV twice daily.  Appreciate GI consultation.  They will consider outpatient endoscopy and colonoscopy.  Clear liquids now and hopefully can advance diet later.  Patient does have an element of chest wall pain I will give a dose of Toradol and see if that improves. 3. Alcohol abuse.  Watch closely for alcohol withdrawal.  Patient on alcohol withdrawal protocol. 4. Lactic acidosis secondary to alcohol ketosis. 5. Anxiety depression on Zoloft and trazodone 6. Essential hypertension on Norvasc 7. Tobacco abuse on nicotine patch 8. Cough.  Continue nebulizer treatments      Code Status:     Code Status Orders   (From admission, onward)         Start     Ordered   12/09/19 1729  Full code  Continuous        12/09/19 1729        Code Status History    Date Active Date Inactive Code Status Order ID Comments User Context   02/20/2019 0944 02/20/2019 1734 Full Code [id number]  Chesley NoonJessup, Charles, MD ED   01/23/2019 1648 01/25/2019 1957 Full Code [id number]  Mayo, Allyn KennerKaty Dodd, MD Inpatient   05/17/2018 2327 05/18/2018 1902 Full Code [id number]  Oralia ManisWillis, David, MD Inpatient   05/17/2018 1950 05/17/2018 2327 Full Code [id number]  Nita SickleVeronese, [place], MD ED   12/24/2017 0803 12/25/2017 1645 Full Code [id number]  Arnaldo Nataliamond, Laurance S, MD ED   06/07/2015 1619 06/08/2015 1754 Full Code [id number]  Shirlean KellyNudelman, Robert, MD Inpatient   Advance Care Planning Activity     Disposition Plan: Status is: Inpatient  Dispo: The patient is from: Home  Anticipated d/c is to: Home              Anticipated d/c date is: Dependent on whether he goes in through alcohol withdrawal or not.  Potential earliest disposition will be tomorrow if doing well.              Patient currently still having chest pain and epigastric pain on IV Protonix.  Continue to monitor and advance diet as tolerated.  Consultants:  Gastroenterology  Time spent: 28 minutes  Jesson [employer]

## 2019-12-11 DIAGNOSIS — F10230 Alcohol dependence with withdrawal, uncomplicated: Secondary | ICD-10-CM

## 2019-12-11 LAB — CBC
HCT: 36.7 % — ABNORMAL LOW (ref 39.0–52.0)
Hemoglobin: 11.9 g/dL — ABNORMAL LOW (ref 13.0–17.0)
MCH: 30.1 pg (ref 26.0–34.0)
MCHC: 32.4 g/dL (ref 30.0–36.0)
MCV: 92.7 fL (ref 80.0–100.0)
Platelets: 112 10*3/uL — ABNORMAL LOW (ref 150–400)
RBC: 3.96 MIL/uL — ABNORMAL LOW (ref 4.22–5.81)
RDW: 14.7 % (ref 11.5–15.5)
WBC: 4.9 10*3/uL (ref 4.0–10.5)
nRBC: 0 % (ref 0.0–0.2)

## 2019-12-11 LAB — URINE CULTURE: Culture: NO GROWTH

## 2019-12-11 MED ORDER — ALUM & MAG HYDROXIDE-SIMETH 200-200-20 MG/5ML PO SUSP
30.0000 mL | ORAL | Status: DC | PRN
  Administered 2019-12-11: 30 mL via ORAL
  Filled 2019-12-11: qty 30

## 2019-12-11 MED ORDER — ENSURE ENLIVE PO LIQD
237.0000 mL | Freq: Two times a day (BID) | ORAL | Status: DC
  Administered 2019-12-11: 237 mL via ORAL

## 2019-12-11 NOTE — Progress Notes (Signed)
Initial Nutrition Assessment  RD working remotely.  DOCUMENTATION CODES:   Not applicable  INTERVENTION:  Provide Ensure Enlive po BID, each supplement provides 350 kcal and 20 grams of protein.  NUTRITION DIAGNOSIS:   Inadequate oral intake related to decreased appetite, nausea, vomiting as evidenced by meal completion < 50% (per review of chart).  GOAL:   Patient will meet greater than or equal to 90% of their needs  MONITOR:   PO intake, Supplement acceptance, Labs, Weight trends, I & O's  REASON FOR ASSESSMENT:   Malnutrition Screening Tool    ASSESSMENT:   69 year old male with PMHx of anxiety, CKD, GERD, HTN, EtOH abuse admitted with alcoholic ketoacidosis, chest pain and epigastric pain with N/V.   Attempted to call patient over the phone multiple times but call unable to go through. According to chart patient ate 0% of lunch and dinner yesterday. Noted diet was advanced from clear liquids to heart healthy this morning. Will monitor PO intake. Patient would benefit from oral nutrition supplement to help meet calorie/protein needs.  According to chart patient was 68.9 kg on 03/28/2019 and has now gained weight to 73.4 kg (161.9 lbs). Will continue to monitor weight trend.  Medications reviewed and include: folic acid 1 mg daily, MVI daily, nicotine patch, Protonix, sertraline, thiamine 100 mg daily, NS at 50 mL/hr.  Labs reviewed.  Unable to determine if patient meets criteria for malnutrition at this time.  NUTRITION - FOCUSED PHYSICAL EXAM:  Unable to complete as RD is working remotely.  Diet Order:   Diet Order            Diet Heart Room service appropriate? Yes; Fluid consistency: Thin  Diet effective now                EDUCATION NEEDS:   No education needs have been identified at this time  Skin:  Skin Assessment: Reviewed RN Assessment  Last BM:  12/07/2019  Height:   Ht Readings from Last 1 Encounters:  12/09/19 5\' 8"  (1.727 m)   Weight:    Wt Readings from Last 1 Encounters:  12/09/19 73.4 kg   BMI:  Body mass index is 24.62 kg/m.  Estimated Nutritional Needs:   Kcal:  1800-2000  Protein:  90-100 grams  Fluid:  1.8-2 L/day  12/11/19, MS, RD, LDN Pager number available on Amion

## 2019-12-11 NOTE — Progress Notes (Signed)
The patient did not score on CIWA scale for ativan during dayshift.

## 2019-12-11 NOTE — Progress Notes (Signed)
Patient ID: Chad [name], male   DOB: September 23, 1950, 69 y.o.   MRN: [id number] [facility] PROGRESS NOTE  Chad [name] VPX:[id number] DOB: [birth year] DOA: 12/09/2019 PCP: Rayetta Humphrey, MD  HPI/Subjective: Patient seen this morning. Still not feeling any better. Still having chest pain and abdominal pain. Wants to try a diet. Had a real bad night last night with regards to shakiness and alcohol withdrawal.  Objective: Vitals:   12/11/19 1319 12/11/19 1407  BP: (!) 147/82   Pulse: 80   Resp: 16   Temp: 98.2 F (36.8 C)   SpO2: 98% 95%    Intake/Output Summary (Last 24 hours) at 12/11/2019 1540 Last data filed at 12/11/2019 1300 Gross per 24 hour  Intake 2823 ml  Output 1350 ml  Net 1473 ml   Filed Weights   12/09/19 0356 12/09/19 2344  Weight: 68 kg 73.4 kg    ROS: Review of Systems  Respiratory: Negative for shortness of breath.   Cardiovascular: Negative for chest pain.  Gastrointestinal: Negative for abdominal pain, nausea and vomiting.  Neurological: Positive for tremors.   Exam: Physical Exam HENT:     Mouth/Throat:     Pharynx: No oropharyngeal exudate.  Eyes:     General: Lids are normal.     Conjunctiva/sclera: Conjunctivae normal.     Pupils: Pupils are equal, round, and reactive to light.  Cardiovascular:     Rate and Rhythm: Normal rate and regular rhythm.     Heart sounds: Normal heart sounds, S1 normal and S2 normal.  Pulmonary:     Breath sounds: No decreased breath sounds, wheezing, rhonchi or rales.  Abdominal:     Palpations: Abdomen is soft.     Tenderness: There is no abdominal tenderness.  Musculoskeletal:     Right ankle: No swelling.     Left ankle: No swelling.  Skin:    General: Skin is warm.     Findings: No rash.  Neurological:     Mental Status: He is alert and oriented to person, place, and time.     Comments: Positive tremor       Data Reviewed: Basic Metabolic Panel: Recent Labs  Lab 12/09/19 0359  12/09/19 1818 12/10/19 0424  NA 133*  --  139  K 5.0  --  4.2  CL 97*  --  104  CO2 11*  --  27  GLUCOSE 88  --  110*  BUN 25*  --  13  CREATININE 1.12  --  0.78  CALCIUM 8.8*  --  8.8*  MG  --  2.2  --    Liver Function Tests: Recent Labs  Lab 12/09/19 0359  AST 45*  ALT 18  ALKPHOS 65  BILITOT 1.4*  PROT 7.5  ALBUMIN 4.4   Recent Labs  Lab 12/09/19 0359  LIPASE 20   CBC: Recent Labs  Lab 12/09/19 0359 12/10/19 0424 12/11/19 0551  WBC 18.6* 9.5 4.9  NEUTROABS 15.9*  --   --   HGB 15.0 11.7* 11.9*  HCT 45.8 35.5* 36.7*  MCV 91.2 90.3 92.7  PLT 238 144* 112*   Cardiac Enzymes: Recent Labs  Lab 12/09/19 0644  CKTOTAL 61     Recent Results (from the past 240 hour(s))  Blood culture (routine single)     Status: None (Preliminary result)   Collection Time: 12/09/19  3:00 PM   Specimen: BLOOD  Result Value Ref Range Status   Specimen Description BLOOD LEFT ANTECUBITAL  Final   Special Requests  Final    BOTTLES DRAWN AEROBIC AND ANAEROBIC Blood Culture adequate volume   Culture   Final    NO GROWTH 2 DAYS Performed at [facility], [address] [place], [place] [address]    Report Status PENDING  Incomplete  Respiratory Panel by RT PCR (Flu A&B, Covid) - Nasopharyngeal Swab     Status: None   Collection Time: 12/09/19  3:00 PM   Specimen: Nasopharyngeal Swab  Result Value Ref Range Status   SARS Coronavirus 2 by RT PCR NEGATIVE NEGATIVE Final    Comment: (NOTE) SARS-CoV-2 target nucleic acids are NOT DETECTED.  The SARS-CoV-2 RNA is generally detectable in upper respiratoy specimens during the acute phase of infection. The lowest concentration of SARS-CoV-2 viral copies this assay can detect is 131 copies/mL. A negative result does not preclude SARS-Cov-2 infection and should not be used as the sole basis for treatment or other patient management decisions. A negative result may occur with  improper specimen collection/handling,  submission of specimen other than nasopharyngeal swab, presence of viral mutation(s) within the areas targeted by this assay, and inadequate number of viral copies (<131 copies/mL). A negative result must be combined with clinical observations, patient history, and epidemiological information. The expected result is Negative.  Fact Sheet for Patients:  https://www.moore.com/  Fact Sheet for Healthcare Providers:  https://www.young.biz/  This test is no t yet approved or cleared by the [place] FDA and  has been authorized for detection and/or diagnosis of SARS-CoV-2 by FDA under an Emergency Use Authorization (EUA). This EUA will remain  in effect (meaning this test can be used) for the duration of the COVID-19 declaration under Section 564(b)(1) of the Act, 21 U.S.C. section 360bbb-3(b)(1), unless the authorization is terminated or revoked sooner.     Influenza A by PCR NEGATIVE NEGATIVE Final   Influenza B by PCR NEGATIVE NEGATIVE Final    Comment: (NOTE) The Xpert Xpress SARS-CoV-2/FLU/RSV assay is intended as an aid in  the diagnosis of influenza from Nasopharyngeal swab specimens and  should not be used as a sole basis for treatment. Nasal washings and  aspirates are unacceptable for Xpert Xpress SARS-CoV-2/FLU/RSV  testing.  Fact Sheet for Patients: https://www.moore.com/  Fact Sheet for Healthcare Providers: https://www.young.biz/  This test is not yet approved or cleared by the [place] FDA and  has been authorized for detection and/or diagnosis of SARS-CoV-2 by  FDA under an Emergency Use Authorization (EUA). This EUA will remain  in effect (meaning this test can be used) for the duration of the  Covid-19 declaration under Section 564(b)(1) of the Act, 21  U.S.C. section 360bbb-3(b)(1), unless the authorization is  terminated or revoked. Performed at [facility], [address] [place], [place] [address]   Urine culture     Status: None   Collection Time: 12/09/19  3:48 PM   Specimen: Urine, Random  Result Value Ref Range Status   Specimen Description   Final    URINE, RANDOM Performed at [facility], [address] [place], [place] [address]    Special Requests   Final    NONE Performed at [facility], [address] [place], [place] [address]    Culture   Final    NO GROWTH Performed at [facility], [address] [place], [place] [address]    Report Status 12/11/2019 FINAL  Final     Scheduled Meds: . amLODipine  5 mg Oral QPM  .  doxazosin  8 mg Oral QHS  . feeding supplement (ENSURE ENLIVE)  237 mL Oral BID BM  . folic acid  1 mg Oral Daily  . gabapentin  300 mg Oral TID  . ipratropium-albuterol  3 mL Nebulization Q6H  . multivitamin with minerals  1 tablet Oral Daily  . nicotine  21 mg Transdermal Daily  . pantoprazole (PROTONIX) IV  40 mg Intravenous Q12H  . sertraline  50 mg Oral Daily  . thiamine  100 mg Oral Daily   Or  . thiamine  100 mg Intravenous Daily  . traZODone  100 mg Oral QHS   Continuous Infusions: . sodium chloride 50 mL/hr at 12/11/19 0015    Assessment/Plan:  1. Alcohol withdrawal on alcohol withdrawal protocol. Patient stated he had a bad night with regards to tremors. Continue Ativan as needed. 2. Alcoholic ketoacidosis improved with IV fluid hydration. 3. Chest pain and epigastric pain. Try to advance diet today continue Protonix 40 mg IV twice a day. GI would like to follow-up as outpatient for outpatient endoscopy and colonoscopy. 4. Anxiety depression on Zoloft and trazodone 5. Essential hypertension on Norvasc 6. Tobacco abuse on nicotine patch      Code Status:     Code Status Orders  (From admission, onward)         Start     Ordered   12/09/19 1729  Full code  Continuous        12/09/19 1729        Code Status History    Date Active Date Inactive  Code Status Order ID Comments User Context   02/20/2019 0944 02/20/2019 1734 Full Code [id number]  Chesley Noon, MD ED   01/23/2019 1648 01/25/2019 1957 Full Code [id number]  Mayo, Allyn Kenner, MD Inpatient   05/17/2018 2327 05/18/2018 1902 Full Code [id number]  Oralia Manis, MD Inpatient   05/17/2018 1950 05/17/2018 2327 Full Code [id number]  Nita Sickle, MD ED   12/24/2017 0803 12/25/2017 1645 Full Code [id number]  Arnaldo Natal, MD ED   06/07/2015 1619 06/08/2015 1754 Full Code [id number]  Shirlean Kelly, MD Inpatient   Advance Care Planning Activity     Family Communication: Deferred me calling family Disposition Plan: Status is: Inpatient  Dispo: The patient is from: Home              Anticipated d/c is to: Home              Anticipated d/c date is: We will reevaluate on a daily basis on when to go home. Patient had a rough night with regards to tremors.              Patient currently being treated for alcohol withdrawal.  Time spent: 26 minutes  Delman [employer]

## 2019-12-12 LAB — BASIC METABOLIC PANEL
Anion gap: 9 (ref 5–15)
BUN: 7 mg/dL — ABNORMAL LOW (ref 8–23)
CO2: 28 mmol/L (ref 22–32)
Calcium: 8.9 mg/dL (ref 8.9–10.3)
Chloride: 102 mmol/L (ref 98–111)
Creatinine, Ser: 0.81 mg/dL (ref 0.61–1.24)
GFR calc Af Amer: >60 mL/min (ref >60)
GFR calc non Af Amer: >60 mL/min (ref >60)
Glucose, Bld: 119 mg/dL — ABNORMAL HIGH (ref 70–99)
Potassium: 3.4 mmol/L — ABNORMAL LOW (ref 3.5–5.1)
Sodium: 139 mmol/L (ref 135–145)

## 2019-12-12 LAB — MAGNESIUM: Magnesium: 1.9 mg/dL (ref 1.7–2.4)

## 2019-12-12 MED ORDER — IPRATROPIUM-ALBUTEROL 0.5-2.5 (3) MG/3ML IN SOLN: 3.0000 mL | Freq: Four times a day (QID) | RESPIRATORY_TRACT | Status: DC | PRN

## 2019-12-12 MED ORDER — IPRATROPIUM-ALBUTEROL 0.5-2.5 (3) MG/3ML IN SOLN
3.0000 mL | Freq: Three times a day (TID) | RESPIRATORY_TRACT | Status: DC
  Filled 2019-12-12: qty 3

## 2019-12-12 MED ORDER — ENSURE ENLIVE PO LIQD: 237.0000 mL | Freq: Two times a day (BID) | ORAL | 0 refills | Status: DC

## 2019-12-12 MED ORDER — AMLODIPINE BESYLATE 5 MG PO TABS
5.0000 mg | ORAL_TABLET | Freq: Every evening | ORAL | 0 refills | Status: DC
Start: 2019-12-12 — End: 2020-02-10

## 2019-12-12 MED ORDER — PANTOPRAZOLE SODIUM 40 MG PO TBEC: 40.0000 mg | DELAYED_RELEASE_TABLET | Freq: Two times a day (BID) | ORAL | 0 refills | Status: DC

## 2019-12-12 MED ORDER — HYDROCODONE-ACETAMINOPHEN 5-325 MG PO TABS
1.0000 | ORAL_TABLET | Freq: Four times a day (QID) | ORAL | 0 refills | Status: DC | PRN
Start: 2019-12-12 — End: 2020-01-06

## 2019-12-12 MED ORDER — THIAMINE HCL 100 MG PO TABS
100.0000 mg | ORAL_TABLET | Freq: Every day | ORAL | 0 refills | Status: DC
Start: 2019-12-12 — End: 2020-10-02

## 2019-12-12 MED ORDER — ONDANSETRON HCL 4 MG PO TABS: 4.0000 mg | ORAL_TABLET | Freq: Three times a day (TID) | ORAL | 0 refills | Status: DC | PRN

## 2019-12-12 MED ORDER — NICOTINE 21 MG/24HR TD PT24: MEDICATED_PATCH | TRANSDERMAL | 0 refills | Status: DC

## 2019-12-12 MED ORDER — HYDROXYZINE PAMOATE 25 MG PO CAPS: 25.0000 mg | ORAL_CAPSULE | Freq: Three times a day (TID) | ORAL | 0 refills | Status: DC | PRN

## 2019-12-12 MED ORDER — TRAZODONE HCL 100 MG PO TABS
100.0000 mg | ORAL_TABLET | Freq: Every day | ORAL | 0 refills | Status: DC
Start: 2019-12-12 — End: 2020-10-02

## 2019-12-12 MED ORDER — POTASSIUM CHLORIDE CRYS ER 20 MEQ PO TBCR
20.0000 meq | EXTENDED_RELEASE_TABLET | Freq: Once | ORAL | Status: AC
  Administered 2019-12-12: 20 meq via ORAL
  Filled 2019-12-12: qty 1

## 2019-12-12 NOTE — TOC Transition Note (Signed)
Transition of Care [facility]) - CM/SW Discharge Note   Patient Details  Name: Chad [name] MRN: [id number] Date of Birth: 1951/01/19  [facility] CM/SW Contact:  Margarito Liner, LCSW Phone Number: 12/12/2019, 9:57 AM   Clinical Narrative: Patient has orders to discharge home today. Per RN, patient can afford to pay for a taxi home. No further concerns. CSW signing off.    Final next level of care: Home/Self Care Barriers to Discharge: No Barriers Identified   Patient Goals and CMS Choice Patient states their goals for this hospitalization and ongoing recovery are:: "wants to eat something"      Discharge Placement                Patient to be transferred to facility by: Taxi   Patient and family notified of of transfer: 12/12/19  Discharge Plan and Services In-house Referral: Clinical Social Work Discharge Planning Services: CM Consult Post Acute Care Choice: NA                               Social Determinants of Health (SDOH) Interventions     Readmission Risk Interventions No flowsheet data found.

## 2019-12-12 NOTE — Discharge Instructions (Signed)
No ibuprofen. 

## 2019-12-12 NOTE — Care Management Important Message (Signed)
Important Message  Patient Details  Name: Chad [name] MRN: [id number] Date of Birth: [birth year]   Medicare Important Message Given:  No  Discharged prior to arrival to unit to deliver concurrent Medicare IM.     [name] 12/12/2019, 1:36 PM

## 2019-12-12 NOTE — Progress Notes (Signed)
Chad [name]  A and O x 4 VSS. Pt tolerating diet well. No complaints of pain or nausea. IV removed intact, prescriptions given. Pt voices understanding of discharge instructions with no further questions. Pt discharged via wheelchair with axillary.   Allergies as of 12/12/2019      Reactions   Morphine And Related Other (See Comments)   Causes "bad disposition"   Ibuprofen Other (See Comments)   Gi upset      Medication List    STOP taking these medications   mirtazapine 7.5 MG tablet Commonly known as: REMERON     TAKE these medications   acetaminophen 500 MG tablet Commonly known as: TYLENOL Take 1,000 mg by mouth every 6 (six) hours as needed.   amLODipine 5 MG tablet Commonly known as: NORVASC Take 1 tablet (5 mg total) by mouth every evening.   escitalopram 20 MG tablet Commonly known as: LEXAPRO Take 20 mg by mouth daily.   feeding supplement (ENSURE ENLIVE) Liqd Take 237 mLs by mouth 2 (two) times daily between meals.   HYDROcodone-acetaminophen 5-325 MG tablet Commonly known as: NORCO/VICODIN Take 1 tablet by mouth every 6 (six) hours as needed for severe pain. What changed: when to take this   hydrOXYzine 25 MG capsule Commonly known as: Vistaril Take 1 capsule (25 mg total) by mouth 3 (three) times daily as needed for anxiety or itching.   nicotine 21 mg/24hr patch Commonly known as: NICODERM CQ - dosed in mg/24 hours One 21mg  patch chest wall daily (okay to substitute generic) What changed:   how much to take  how to take this  when to take this  additional instructions   ondansetron 4 MG tablet Commonly known as: Zofran Take 1 tablet (4 mg total) by mouth every 8 (eight) hours as needed for nausea or vomiting.   pantoprazole 40 MG tablet Commonly known as: Protonix Take 1 tablet (40 mg total) by mouth 2 (two) times daily.   thiamine 100 MG tablet Take 1 tablet (100 mg total) by mouth daily.   traZODone 100 MG tablet Commonly known as:  DESYREL Take 1 tablet (100 mg total) by mouth at bedtime.       Vitals:   12/11/19 2125 12/12/19 0502  BP:  (!) 143/88  Pulse:  69  Resp:  16  Temp:  97.7 F (36.5 C)  SpO2: 95% 98%    Chad [name] 12/14/19

## 2019-12-12 NOTE — Discharge Summary (Signed)
[facility] [place] [facility]   PATIENT NAME: Chad [name]    MR#:  [id number]  DATE OF BIRTH:  Apr 12, 1951  DATE OF ADMISSION:  12/09/2019 ADMITTING PHYSICIAN: Alford Highland, MD  DATE OF DISCHARGE: 12/12/2019 11:48 AM  PRIMARY CARE PHYSICIAN: Rayetta Humphrey, MD    ADMISSION DIAGNOSIS:  Alcoholic ketoacidosis [E87.2] Generalized abdominal pain [R10.84] High anion gap metabolic acidosis [E87.2] Influenza-like illness [J11.1] Acute dyspnea [R06.00]  DISCHARGE DIAGNOSIS:  Active Problems:   Alcoholic ketoacidosis   Alcohol dependence with uncomplicated withdrawal (HCC)   Anxiety and depression   Epigastric pain   Alcohol abuse   SECONDARY DIAGNOSIS:   Past Medical History:  Diagnosis Date  . Anxiety    takes lexapro  . Chronic kidney disease    patient states he was told he has 'moderate kidney disease'  . Degenerative disc disease, lumbar   . Exposure to hepatitis C   . GERD (gastroesophageal reflux disease)    takes OTC as needed - omeprazole  . Headache    on occasion  . History of gallstones   . Hypertension    diet controlled  . Pneumonia approx 15 years ago    HOSPITAL COURSE:   1.  Alcoholic ketoacidosis which improved with IV fluid hydration during hospital course. 2.  Chest pain and epigastric pain likely secondary to GERD.  The patient was placed on IV Protonix during the hospital course.  I asked gastroenterology to see him and they would like to follow-up as outpatient for endoscopy and colonoscopy.  I did prescribe a small amount of pain medication.  Advised him not to take ibuprofen because that could also be irritating the stomach. 3.  Alcohol withdrawal.  The patient did not require any Ativan yesterday during the day and overnight.  This has improved. 4.  Anxiety depression on Lexapro and trazodone 5.  Essential hypertension on Norvasc 6.  Tobacco abuse prescribe nicotine patch. 7.  Patient asked for a prescription  for Vistaril.  DISCHARGE CONDITIONS:   Fair  CONSULTS OBTAINED:  Treatment Team:  Pasty Spillers, MD  DRUG ALLERGIES:   Allergies  Allergen Reactions  . Morphine And Related Other (See Comments)    Causes "bad disposition"  . Ibuprofen Other (See Comments)    Gi upset    DISCHARGE MEDICATIONS:   Allergies as of 12/12/2019      Reactions   Morphine And Related Other (See Comments)   Causes "bad disposition"   Ibuprofen Other (See Comments)   Gi upset      Medication List    STOP taking these medications   mirtazapine 7.5 MG tablet Commonly known as: REMERON     TAKE these medications   acetaminophen 500 MG tablet Commonly known as: TYLENOL Take 1,000 mg by mouth every 6 (six) hours as needed.   amLODipine 5 MG tablet Commonly known as: NORVASC Take 1 tablet (5 mg total) by mouth every evening.   escitalopram 20 MG tablet Commonly known as: LEXAPRO Take 20 mg by mouth daily.   feeding supplement (ENSURE ENLIVE) Liqd Take 237 mLs by mouth 2 (two) times daily between meals.   HYDROcodone-acetaminophen 5-325 MG tablet Commonly known as: NORCO/VICODIN Take 1 tablet by mouth every 6 (six) hours as needed for severe pain. What changed: when to take this   hydrOXYzine 25 MG capsule Commonly known as: Vistaril Take 1 capsule (25 mg total) by mouth 3 (three) times daily as needed for anxiety or itching.  nicotine 21 mg/24hr patch Commonly known as: NICODERM CQ - dosed in mg/24 hours One 21mg  patch chest wall daily (okay to substitute generic) What changed:  how much to take how to take this when to take this additional instructions   ondansetron 4 MG tablet Commonly known as: Zofran Take 1 tablet (4 mg total) by mouth every 8 (eight) hours as needed for nausea or vomiting.   pantoprazole 40 MG tablet Commonly known as: Protonix Take 1 tablet (40 mg total) by mouth 2 (two) times daily.   thiamine 100 MG tablet Take 1 tablet (100 mg total) by  mouth daily.   traZODone 100 MG tablet Commonly known as: DESYREL Take 1 tablet (100 mg total) by mouth at bedtime.        DISCHARGE INSTRUCTIONS:   Follow-up PMD 5 days Follow-up with gastroenterology 2 to 3 weeks  If you experience worsening of your admission symptoms, develop shortness of breath, life threatening emergency, suicidal or homicidal thoughts you must seek medical attention immediately by calling 911 or calling your MD immediately  if symptoms less severe.  You Must read complete instructions/literature along with all the possible adverse reactions/side effects for all the Medicines you take and that have been prescribed to you. Take any new Medicines after you have completely understood and accept all the possible adverse reactions/side effects.   Please note  You were cared for by a hospitalist during your hospital stay. If you have any questions about your discharge medications or the care you received while you were in the hospital after you are discharged, you can call the unit and asked to speak with the hospitalist on call if the hospitalist that took care of you is not available. Once you are discharged, your primary care physician will handle any further medical issues. Please note that NO REFILLS for any discharge medications will be authorized once you are discharged, as it is imperative that you return to your primary care physician (or establish a relationship with a primary care physician if you do not have one) for your aftercare needs so that they can reassess your need for medications and monitor your lab values.    Today   CHIEF COMPLAINT:   Chief Complaint  Patient presents with  . Shortness of Breath    HISTORY OF PRESENT ILLNESS:  Chad [name]  is a 69 y.o. male came in with shortness of breath   VITAL SIGNS:  Blood pressure (!) 143/88, pulse 69, temperature 97.7 F (36.5 C), temperature source Oral, resp. rate 16, height 5\' 8"  (1.727 m),  weight 73.4 kg, SpO2 98 %.   PHYSICAL EXAMINATION:  GENERAL:  69 y.o.-year-old patient lying in the bed with no acute distress.  EYES: Pupils equal, round, reactive to light and accommodation. No scleral icterus.  HEENT: Head atraumatic, normocephalic. Oropharynx and nasopharynx clear.  LUNGS: Normal breath sounds bilaterally, no wheezing, rales,rhonchi or crepitation. No use of accessory muscles of respiration.  CARDIOVASCULAR: S1, S2 normal. No murmurs, rubs, or gallops.  ABDOMEN: Soft, slight epigastric tenderness.  EXTREMITIES: No pedal edema.  NEUROLOGIC: Cranial nerves II through XII are intact.  Only slight tremor. PSYCHIATRIC: The patient is alert and oriented x 3.  SKIN: No obvious rash, lesion, or ulcer.   DATA REVIEW:   CBC Recent Labs  Lab 12/11/19 0551  WBC 4.9  HGB 11.9*  HCT 36.7*  PLT 112*    Chemistries  Recent Labs  Lab 12/09/19 0359 12/09/19 1818 12/12/19 0349  NA  133*   < > 139  K 5.0   < > 3.4*  CL 97*   < > 102  CO2 11*   < > 28  GLUCOSE 88   < > 119*  BUN 25*   < > 7*  CREATININE 1.12   < > 0.81  CALCIUM 8.8*   < > 8.9  MG  --    < > 1.9  AST 45*  --   --   ALT 18  --   --   ALKPHOS 65  --   --   BILITOT 1.4*  --   --    < > = values in this interval not displayed.    Microbiology Results  Results for orders placed or performed during the hospital encounter of 12/09/19  Blood culture (routine single)     Status: None (Preliminary result)   Collection Time: 12/09/19  3:00 PM   Specimen: BLOOD  Result Value Ref Range Status   Specimen Description BLOOD LEFT ANTECUBITAL  Final   Special Requests   Final    BOTTLES DRAWN AEROBIC AND ANAEROBIC Blood Culture adequate volume   Culture   Final    NO GROWTH 3 DAYS Performed at [facility], [address], [place], [place] [address]    Report Status PENDING  Incomplete  Respiratory Panel by RT PCR (Flu A&B, Covid) - Nasopharyngeal Swab     Status: None   Collection Time: 12/09/19   3:00 PM   Specimen: Nasopharyngeal Swab  Result Value Ref Range Status   SARS Coronavirus 2 by RT PCR NEGATIVE NEGATIVE Final    Comment: (NOTE) SARS-CoV-2 target nucleic acids are NOT DETECTED.  The SARS-CoV-2 RNA is generally detectable in upper respiratoy specimens during the acute phase of infection. The lowest concentration of SARS-CoV-2 viral copies this assay can detect is 131 copies/mL. A negative result does not preclude SARS-Cov-2 infection and should not be used as the sole basis for treatment or other patient management decisions. A negative result may occur with  improper specimen collection/handling, submission of specimen other than nasopharyngeal swab, presence of viral mutation(s) within the areas targeted by this assay, and inadequate number of viral copies (<131 copies/mL). A negative result must be combined with clinical observations, patient history, and epidemiological information. The expected result is Negative.  Fact Sheet for Patients:  https://www.moore.com/  Fact Sheet for Healthcare Providers:  https://www.young.biz/  This test is no t yet approved or cleared by the [place] FDA and  has been authorized for detection and/or diagnosis of SARS-CoV-2 by FDA under an Emergency Use Authorization (EUA). This EUA will remain  in effect (meaning this test can be used) for the duration of the COVID-19 declaration under Section 564(b)(1) of the Act, 21 U.S.C. section 360bbb-3(b)(1), unless the authorization is terminated or revoked sooner.     Influenza A by PCR NEGATIVE NEGATIVE Final   Influenza B by PCR NEGATIVE NEGATIVE Final    Comment: (NOTE) The Xpert Xpress SARS-CoV-2/FLU/RSV assay is intended as an aid in  the diagnosis of influenza from Nasopharyngeal swab specimens and  should not be used as a sole basis for treatment. Nasal washings and  aspirates are unacceptable for Xpert Xpress SARS-CoV-2/FLU/RSV   testing.  Fact Sheet for Patients: https://www.moore.com/  Fact Sheet for Healthcare Providers: https://www.young.biz/  This test is not yet approved or cleared by the [place] FDA and  has been authorized for detection and/or diagnosis of SARS-CoV-2 by  FDA under  an Emergency Use Authorization (EUA). This EUA will remain  in effect (meaning this test can be used) for the duration of the  Covid-19 declaration under Section 564(b)(1) of the Act, 21  U.S.C. section 360bbb-3(b)(1), unless the authorization is  terminated or revoked. Performed at [facility], [address] [place], [place] [address]   Urine culture     Status: None   Collection Time: 12/09/19  3:48 PM   Specimen: Urine, Random  Result Value Ref Range Status   Specimen Description   Final    URINE, RANDOM Performed at [facility], [address], [place], [place] [address]    Special Requests   Final    NONE Performed at [facility], [address], [place], [place] [address]    Culture   Final    NO GROWTH Performed at [facility], [address] [place], [place] [address]    Report Status 12/11/2019 FINAL  Final     Management plans discussed with the patient, and he is in agreement.  CODE STATUS:     Code Status Orders  (From admission, onward)         Start     Ordered   12/09/19 1729  Full code  Continuous        12/09/19 1729        Code Status History    Date Active Date Inactive Code Status Order ID Comments User Context   02/20/2019 0944 02/20/2019 1734 Full Code [id number]  Chesley Noon, MD ED   01/23/2019 1648 01/25/2019 1957 Full Code [id number]  Mayo, Allyn Kenner, MD Inpatient   05/17/2018 2327 05/18/2018 1902 Full Code [id number]  Oralia Manis, MD Inpatient   05/17/2018 1950 05/17/2018 2327 Full Code [id number]  Nita Sickle, MD ED   12/24/2017 0803 12/25/2017 1645 Full Code [id number]  Arnaldo Natal, MD ED   06/07/2015 1619 06/08/2015 1754 Full Code [id number]  Shirlean Kelly, MD Inpatient   Advance Care Planning Activity      TOTAL TIME TAKING CARE OF THIS PATIENT: 35 minutes.    Alford Highland M.D on 12/12/2019 at 3:29 PM  Between 7am to 6pm - Pager - [phone]  After 6pm go to [email or web address] - password [email or web address]  [facility]  CC: Primary care physician; Rayetta Humphrey, MD

## 2019-12-13 LAB — VOLATILES,BLD-ACETONE,ETHANOL,ISOPROP,METHANOL
Acetone, blood: <0.01 g/dL (ref 0.000–0.010)
Ethanol, blood: <0.01 g/dL (ref 0.000–0.010)
Isopropanol, blood: <0.01 g/dL (ref 0.000–0.010)
Methanol, blood: <0.01 g/dL (ref 0.000–0.010)

## 2019-12-14 LAB — CULTURE, BLOOD (SINGLE)
Culture: NO GROWTH
Special Requests: ADEQUATE

## 2019-12-31 ENCOUNTER — Other Ambulatory Visit: Payer: Self-pay

## 2019-12-31 ENCOUNTER — Emergency Department: Payer: Medicare HMO

## 2019-12-31 ENCOUNTER — Encounter: Payer: Self-pay | Admitting: Emergency Medicine

## 2019-12-31 ENCOUNTER — Inpatient Hospital Stay
Admission: EM | Admit: 2019-12-31 | Discharge: 2020-01-06 | DRG: 357 | Disposition: A | Discharge status: Skilled Nursing Facility | Payer: Medicare HMO | Attending: Internal Medicine | Admitting: Internal Medicine

## 2019-12-31 DIAGNOSIS — R079 Chest pain, unspecified: Secondary | ICD-10-CM

## 2019-12-31 DIAGNOSIS — K219 Gastro-esophageal reflux disease without esophagitis: Secondary | ICD-10-CM | POA: Diagnosis present

## 2019-12-31 DIAGNOSIS — K8000 Calculus of gallbladder with acute cholecystitis without obstruction: Secondary | ICD-10-CM | POA: Diagnosis present

## 2019-12-31 DIAGNOSIS — N182 Chronic kidney disease, stage 2 (mild): Secondary | ICD-10-CM | POA: Diagnosis present

## 2019-12-31 DIAGNOSIS — K920 Hematemesis: Secondary | ICD-10-CM | POA: Diagnosis present

## 2019-12-31 DIAGNOSIS — Y905 Blood alcohol level of 100-119 mg/100 ml: Secondary | ICD-10-CM | POA: Diagnosis present

## 2019-12-31 DIAGNOSIS — F32A Depression, unspecified: Secondary | ICD-10-CM | POA: Diagnosis present

## 2019-12-31 DIAGNOSIS — F10230 Alcohol dependence with withdrawal, uncomplicated: Secondary | ICD-10-CM | POA: Diagnosis present

## 2019-12-31 DIAGNOSIS — F17210 Nicotine dependence, cigarettes, uncomplicated: Secondary | ICD-10-CM | POA: Diagnosis present

## 2019-12-31 DIAGNOSIS — R140 Abdominal distension (gaseous): Secondary | ICD-10-CM

## 2019-12-31 DIAGNOSIS — F419 Anxiety disorder, unspecified: Secondary | ICD-10-CM | POA: Diagnosis present

## 2019-12-31 DIAGNOSIS — Z86718 Personal history of other venous thrombosis and embolism: Secondary | ICD-10-CM | POA: Diagnosis not present

## 2019-12-31 DIAGNOSIS — Z20822 Contact with and (suspected) exposure to covid-19: Secondary | ICD-10-CM | POA: Diagnosis present

## 2019-12-31 DIAGNOSIS — R1013 Epigastric pain: Secondary | ICD-10-CM | POA: Diagnosis present

## 2019-12-31 DIAGNOSIS — I129 Hypertensive chronic kidney disease with stage 1 through stage 4 chronic kidney disease, or unspecified chronic kidney disease: Secondary | ICD-10-CM | POA: Diagnosis present

## 2019-12-31 DIAGNOSIS — Z720 Tobacco use: Secondary | ICD-10-CM | POA: Diagnosis not present

## 2019-12-31 DIAGNOSIS — E8729 Other acidosis: Secondary | ICD-10-CM | POA: Diagnosis present

## 2019-12-31 DIAGNOSIS — F1010 Alcohol abuse, uncomplicated: Secondary | ICD-10-CM | POA: Diagnosis not present

## 2019-12-31 DIAGNOSIS — Z79899 Other long term (current) drug therapy: Secondary | ICD-10-CM | POA: Diagnosis not present

## 2019-12-31 DIAGNOSIS — I10 Essential (primary) hypertension: Secondary | ICD-10-CM | POA: Diagnosis not present

## 2019-12-31 DIAGNOSIS — F329 Major depressive disorder, single episode, unspecified: Secondary | ICD-10-CM | POA: Diagnosis not present

## 2019-12-31 DIAGNOSIS — M879 Osteonecrosis, unspecified: Secondary | ICD-10-CM | POA: Diagnosis present

## 2019-12-31 DIAGNOSIS — K810 Acute cholecystitis: Secondary | ICD-10-CM | POA: Diagnosis not present

## 2019-12-31 DIAGNOSIS — F418 Other specified anxiety disorders: Secondary | ICD-10-CM | POA: Diagnosis present

## 2019-12-31 DIAGNOSIS — Z811 Family history of alcohol abuse and dependence: Secondary | ICD-10-CM

## 2019-12-31 DIAGNOSIS — K760 Fatty (change of) liver, not elsewhere classified: Secondary | ICD-10-CM | POA: Diagnosis present

## 2019-12-31 DIAGNOSIS — K922 Gastrointestinal hemorrhage, unspecified: Secondary | ICD-10-CM | POA: Diagnosis not present

## 2019-12-31 DIAGNOSIS — E872 Acidosis: Secondary | ICD-10-CM | POA: Diagnosis not present

## 2019-12-31 DIAGNOSIS — Z885 Allergy status to narcotic agent status: Secondary | ICD-10-CM

## 2019-12-31 DIAGNOSIS — R17 Unspecified jaundice: Secondary | ICD-10-CM

## 2019-12-31 LAB — URINALYSIS, COMPLETE (UACMP) WITH MICROSCOPIC
Bacteria, UA: NONE SEEN
Bilirubin Urine: NEGATIVE
Glucose, UA: NEGATIVE mg/dL
Hgb urine dipstick: NEGATIVE
Ketones, ur: 5 mg/dL — AB
Leukocytes,Ua: NEGATIVE
Nitrite: NEGATIVE
Protein, ur: NEGATIVE mg/dL
Specific Gravity, Urine: 1.016 (ref 1.005–1.030)
pH: 5 (ref 5.0–8.0)

## 2019-12-31 LAB — TSH: TSH: 0.463 u[IU]/mL (ref 0.350–4.500)

## 2019-12-31 LAB — PROTIME-INR
INR: 1 (ref 0.8–1.2)
Prothrombin Time: 12.9 seconds (ref 11.4–15.2)

## 2019-12-31 LAB — COMPREHENSIVE METABOLIC PANEL
ALT: 15 U/L (ref 0–44)
AST: 37 U/L (ref 15–41)
Albumin: 4.6 g/dL (ref 3.5–5.0)
Alkaline Phosphatase: 67 U/L (ref 38–126)
Anion gap: 17 — ABNORMAL HIGH (ref 5–15)
BUN: 10 mg/dL (ref 8–23)
CO2: 20 mmol/L — ABNORMAL LOW (ref 22–32)
Calcium: 9.2 mg/dL (ref 8.9–10.3)
Chloride: 98 mmol/L (ref 98–111)
Creatinine, Ser: 0.59 mg/dL — ABNORMAL LOW (ref 0.61–1.24)
GFR calc Af Amer: >60 mL/min (ref >60)
GFR calc non Af Amer: >60 mL/min (ref >60)
Glucose, Bld: 89 mg/dL (ref 70–99)
Potassium: 4.1 mmol/L (ref 3.5–5.1)
Sodium: 135 mmol/L (ref 135–145)
Total Bilirubin: 1.3 mg/dL — ABNORMAL HIGH (ref 0.3–1.2)
Total Protein: 7.7 g/dL (ref 6.5–8.1)

## 2019-12-31 LAB — T4, FREE: Free T4: 1.09 ng/dL (ref 0.61–1.12)

## 2019-12-31 LAB — CBC
HCT: 44.2 % (ref 39.0–52.0)
Hemoglobin: 15.2 g/dL (ref 13.0–17.0)
MCH: 31 pg (ref 26.0–34.0)
MCHC: 34.4 g/dL (ref 30.0–36.0)
MCV: 90 fL (ref 80.0–100.0)
Platelets: 274 10*3/uL (ref 150–400)
RBC: 4.91 MIL/uL (ref 4.22–5.81)
RDW: 16.4 % — ABNORMAL HIGH (ref 11.5–15.5)
WBC: 9.1 10*3/uL (ref 4.0–10.5)
nRBC: 0 % (ref 0.0–0.2)

## 2019-12-31 LAB — LACTIC ACID, PLASMA
Lactic Acid, Venous: 4.3 mmol/L (ref 0.5–1.9)
Lactic Acid, Venous: 1.9 mmol/L (ref 0.5–1.9)
Lactic Acid, Venous: 1.1 mmol/L (ref 0.5–1.9)

## 2019-12-31 LAB — SARS CORONAVIRUS 2 BY RT PCR (HOSPITAL ORDER, PERFORMED IN ~~LOC~~ HOSPITAL LAB): SARS Coronavirus 2: NEGATIVE

## 2019-12-31 LAB — TYPE AND SCREEN
ABO/RH(D): A POS
Antibody Screen: NEGATIVE

## 2019-12-31 LAB — TROPONIN I (HIGH SENSITIVITY)
Troponin I (High Sensitivity): 8 ng/L (ref <18)
Troponin I (High Sensitivity): 11 ng/L (ref <18)
Troponin I (High Sensitivity): 12 ng/L (ref <18)

## 2019-12-31 LAB — LIPASE, BLOOD: Lipase: 24 U/L (ref 11–51)

## 2019-12-31 LAB — ETHANOL: Alcohol, Ethyl (B): 115 mg/dL — ABNORMAL HIGH (ref <10)

## 2019-12-31 LAB — HEMOGLOBIN AND HEMATOCRIT, BLOOD
HCT: 41.2 % (ref 39.0–52.0)
Hemoglobin: 13.7 g/dL (ref 13.0–17.0)

## 2019-12-31 MED ORDER — LORAZEPAM 2 MG/ML IJ SOLN
1.0000 mg | INTRAMUSCULAR | Status: AC | PRN
  Administered 2020-01-02 – 2020-01-03 (×3): 2 mg via INTRAVENOUS
  Administered 2020-01-03: 1 mg via INTRAVENOUS
  Filled 2019-12-31 (×2): qty 1

## 2019-12-31 MED ORDER — LORAZEPAM 1 MG PO TABS
1.0000 mg | ORAL_TABLET | ORAL | Status: AC | PRN
  Administered 2020-01-01: 2 mg via ORAL
  Administered 2020-01-02: 4 mg via ORAL
  Administered 2020-01-02: 2 mg via ORAL
  Filled 2019-12-31: qty 4

## 2019-12-31 MED ORDER — ONDANSETRON 4 MG PO TBDP: 4.0000 mg | ORAL_TABLET | Freq: Once | ORAL | Status: AC | PRN

## 2019-12-31 MED ORDER — FOLIC ACID 1 MG PO TABS
1.0000 mg | ORAL_TABLET | Freq: Every day | ORAL | Status: DC
  Administered 2020-01-01 – 2020-01-06 (×6): 1 mg via ORAL
  Filled 2019-12-31 (×6): qty 1

## 2019-12-31 MED ORDER — LORAZEPAM 2 MG/ML IJ SOLN
0.0000 mg | Freq: Four times a day (QID) | INTRAMUSCULAR | Status: AC
  Administered 2020-01-01: 2 mg via INTRAVENOUS
  Filled 2019-12-31: qty 1

## 2019-12-31 MED ORDER — ONDANSETRON 4 MG PO TBDP
ORAL_TABLET | ORAL | Status: AC
  Administered 2019-12-31: 4 mg via ORAL
  Filled 2019-12-31: qty 1

## 2019-12-31 MED ORDER — IOHEXOL 300 MG/ML  SOLN
100.0000 mL | Freq: Once | INTRAMUSCULAR | Status: AC | PRN
  Administered 2019-12-31: 100 mL via INTRAVENOUS

## 2019-12-31 MED ORDER — SODIUM CHLORIDE 0.9 % IV BOLUS
1000.0000 mL | Freq: Once | INTRAVENOUS | Status: AC
  Administered 2019-12-31: 1000 mL via INTRAVENOUS

## 2019-12-31 MED ORDER — THIAMINE HCL 100 MG/ML IJ SOLN
Freq: Once | INTRAVENOUS | Status: AC
  Filled 2019-12-31: qty 1000

## 2019-12-31 MED ORDER — LORAZEPAM 2 MG/ML IJ SOLN
0.0000 mg | Freq: Two times a day (BID) | INTRAMUSCULAR | Status: AC
  Administered 2020-01-03: 2 mg via INTRAVENOUS
  Filled 2019-12-31 (×3): qty 1

## 2019-12-31 MED ORDER — SODIUM CHLORIDE 0.9 % IV SOLN
80.0000 mg | Freq: Once | INTRAVENOUS | Status: AC
  Administered 2019-12-31: 80 mg via INTRAVENOUS
  Filled 2019-12-31: qty 80

## 2019-12-31 MED ORDER — HYDROXYZINE HCL 25 MG PO TABS
25.0000 mg | ORAL_TABLET | Freq: Three times a day (TID) | ORAL | Status: DC | PRN
  Administered 2020-01-01: 25 mg via ORAL
  Filled 2019-12-31: qty 1

## 2019-12-31 MED ORDER — NICOTINE 21 MG/24HR TD PT24: 21.0000 mg | MEDICATED_PATCH | Freq: Once | TRANSDERMAL | Status: DC

## 2019-12-31 MED ORDER — ONDANSETRON HCL 4 MG/2ML IJ SOLN
4.0000 mg | Freq: Four times a day (QID) | INTRAMUSCULAR | Status: DC | PRN
  Administered 2020-01-03: 4 mg via INTRAVENOUS
  Filled 2019-12-31: qty 2

## 2019-12-31 MED ORDER — SODIUM CHLORIDE 0.9 % IV BOLUS: 1000.0000 mL | Freq: Once | INTRAVENOUS | Status: DC

## 2019-12-31 MED ORDER — NICOTINE 21 MG/24HR TD PT24
21.0000 mg | MEDICATED_PATCH | Freq: Every day | TRANSDERMAL | Status: DC
  Filled 2019-12-31: qty 1

## 2019-12-31 MED ORDER — FENTANYL CITRATE (PF) 100 MCG/2ML IJ SOLN
50.0000 ug | Freq: Once | INTRAMUSCULAR | Status: AC
  Administered 2019-12-31: 50 ug via INTRAVENOUS
  Filled 2019-12-31: qty 2

## 2019-12-31 MED ORDER — SODIUM CHLORIDE 0.9% FLUSH: 3.0000 mL | Freq: Two times a day (BID) | INTRAVENOUS | Status: DC

## 2019-12-31 MED ORDER — THIAMINE HCL 100 MG PO TABS
100.0000 mg | ORAL_TABLET | Freq: Every day | ORAL | Status: DC
  Administered 2020-01-01 – 2020-01-06 (×6): 100 mg via ORAL
  Filled 2019-12-31 (×6): qty 1

## 2019-12-31 MED ORDER — NICOTINE 21 MG/24HR TD PT24
21.0000 mg | MEDICATED_PATCH | Freq: Every day | TRANSDERMAL | Status: DC
  Administered 2019-12-31 – 2020-01-06 (×7): 21 mg via TRANSDERMAL
  Filled 2019-12-31 (×6): qty 1

## 2019-12-31 MED ORDER — LORAZEPAM 2 MG PO TABS
0.0000 mg | ORAL_TABLET | Freq: Four times a day (QID) | ORAL | Status: AC
  Administered 2019-12-31: 2 mg via ORAL
  Filled 2019-12-31 (×3): qty 1

## 2019-12-31 MED ORDER — ONDANSETRON HCL 4 MG/2ML IJ SOLN
4.0000 mg | Freq: Once | INTRAMUSCULAR | Status: DC
  Filled 2019-12-31: qty 2

## 2019-12-31 MED ORDER — ADULT MULTIVITAMIN W/MINERALS CH
1.0000 | ORAL_TABLET | Freq: Every day | ORAL | Status: DC
  Administered 2020-01-01 – 2020-01-06 (×6): 1 via ORAL
  Filled 2019-12-31 (×6): qty 1

## 2019-12-31 MED ORDER — LORAZEPAM 2 MG PO TABS: 0.0000 mg | ORAL_TABLET | Freq: Two times a day (BID) | ORAL | Status: AC

## 2019-12-31 MED ORDER — THIAMINE HCL 100 MG/ML IJ SOLN: 100.0000 mg | Freq: Every day | INTRAMUSCULAR | Status: DC

## 2019-12-31 MED ORDER — SODIUM CHLORIDE 0.9 % IV SOLN
8.0000 mg/h | INTRAVENOUS | Status: DC
  Administered 2019-12-31 – 2020-01-02 (×4): 8 mg/h via INTRAVENOUS
  Filled 2019-12-31 (×4): qty 80

## 2019-12-31 MED ORDER — ONDANSETRON HCL 4 MG PO TABS
4.0000 mg | ORAL_TABLET | Freq: Four times a day (QID) | ORAL | Status: DC | PRN
  Filled 2019-12-31: qty 1

## 2019-12-31 MED ORDER — ESCITALOPRAM OXALATE 10 MG PO TABS
20.0000 mg | ORAL_TABLET | Freq: Every day | ORAL | Status: DC
  Administered 2019-12-31 – 2020-01-06 (×7): 20 mg via ORAL
  Filled 2019-12-31 (×8): qty 2

## 2019-12-31 NOTE — Assessment & Plan Note (Signed)
-  ivf hydration and monitor AG. -monitor for withdrawal pt started on etoh withdrawal precautions. -

## 2019-12-31 NOTE — ED Notes (Signed)
Pt sleeping in bed with all monitors pulled off. Monitors replaced.

## 2019-12-31 NOTE — ED Notes (Signed)
Report given to Annie Smith RN °

## 2019-12-31 NOTE — Assessment & Plan Note (Addendum)
Cont trazodone and lexapro.

## 2019-12-31 NOTE — Assessment & Plan Note (Addendum)
Cont nicotine patch and counseled pt about tobacco cessation and cutting back .

## 2019-12-31 NOTE — Assessment & Plan Note (Signed)
-  cycle troponin. -Suspect noncardiac pain and related to PUD or Esophagitis . -Continue with iv ppi and gi consult.

## 2019-12-31 NOTE — Assessment & Plan Note (Addendum)
Pt on amlodipine and we will continue on amlodipine.  Monitor telemetry and follow.

## 2019-12-31 NOTE — ED Notes (Signed)
Pt sleeping at this time in NAD

## 2019-12-31 NOTE — ED Notes (Signed)
Pt c/o nausea. ODT zofran ordered for pt

## 2019-12-31 NOTE — ED Notes (Signed)
Pt sleeping at present. VSS

## 2019-12-31 NOTE — ED Notes (Signed)
This tech tried 2x to draw labs and was unsuccessful. Lab called to obtain specimen.

## 2019-12-31 NOTE — Assessment & Plan Note (Addendum)
-  started pt on protonix iv. -Npo except for clear liquid sips with meds.  -Gi consulted.  -2 large iv access. -Type and screen/ transfuse if 8 and below pt agrees with transfusion.  -will consider octreotide if pt starts actively bleeding and NM rbc scan. -monitor h/h q6 hourly.
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
## 2019-12-31 NOTE — ED Notes (Signed)
Request was made for transport to the floor
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
## 2019-12-31 NOTE — ED Notes (Signed)
Pt ambulated back into the ED without difficulty or distress

## 2019-12-31 NOTE — ED Notes (Signed)
Helped pt with urinal. 

## 2019-12-31 NOTE — ED Notes (Signed)
Dr. Siadecki at bedside 

## 2019-12-31 NOTE — ED Triage Notes (Signed)
Pt to ED via [facility] from home for abdominal pain and rectal bleeding since yesterday, pt also states that he has been having chest pain for the past 2 days. Pt asking for water in triage. Pt informed that he will need to wait to see MD.   Pt states that he has also had N/V. Pt states that he only had 1 episode of rectal bleeding. Pt states that he went to try to have a BM and nothing but blood came out. Pt is in NAD at this time.

## 2019-12-31 NOTE — ED Notes (Signed)
Pt ambulatory outside without difficulty or distress 

## 2019-12-31 NOTE — ED Notes (Signed)
Unsuccessful IV attempt. Charge RN aware.

## 2019-12-31 NOTE — ED Notes (Signed)
Patient is requesting medication for anxiety, states he is anxious about his health. Patient has made the request multiple times.  Patient states he last took Lexapro yesterday morning.

## 2020-01-01 ENCOUNTER — Inpatient Hospital Stay: Payer: Medicare HMO

## 2020-01-01 DIAGNOSIS — F1010 Alcohol abuse, uncomplicated: Secondary | ICD-10-CM

## 2020-01-01 DIAGNOSIS — R1013 Epigastric pain: Secondary | ICD-10-CM

## 2020-01-01 DIAGNOSIS — F419 Anxiety disorder, unspecified: Secondary | ICD-10-CM

## 2020-01-01 DIAGNOSIS — F10230 Alcohol dependence with withdrawal, uncomplicated: Secondary | ICD-10-CM

## 2020-01-01 DIAGNOSIS — F329 Major depressive disorder, single episode, unspecified: Secondary | ICD-10-CM

## 2020-01-01 DIAGNOSIS — K920 Hematemesis: Principal | ICD-10-CM

## 2020-01-01 LAB — HEMOGLOBIN AND HEMATOCRIT, BLOOD
HCT: 37.6 % — ABNORMAL LOW (ref 39.0–52.0)
HCT: 38.5 % — ABNORMAL LOW (ref 39.0–52.0)
HCT: 37.5 % — ABNORMAL LOW (ref 39.0–52.0)
HCT: 35.6 % — ABNORMAL LOW (ref 39.0–52.0)
Hemoglobin: 12.5 g/dL — ABNORMAL LOW (ref 13.0–17.0)
Hemoglobin: 13 g/dL (ref 13.0–17.0)
Hemoglobin: 12.5 g/dL — ABNORMAL LOW (ref 13.0–17.0)
Hemoglobin: 12.2 g/dL — ABNORMAL LOW (ref 13.0–17.0)

## 2020-01-01 LAB — COMPREHENSIVE METABOLIC PANEL
ALT: 14 U/L (ref 0–44)
AST: 35 U/L (ref 15–41)
Albumin: 3.6 g/dL (ref 3.5–5.0)
Alkaline Phosphatase: 57 U/L (ref 38–126)
Anion gap: 15 (ref 5–15)
BUN: 7 mg/dL — ABNORMAL LOW (ref 8–23)
CO2: 24 mmol/L (ref 22–32)
Calcium: 8.9 mg/dL (ref 8.9–10.3)
Chloride: 98 mmol/L (ref 98–111)
Creatinine, Ser: 0.7 mg/dL (ref 0.61–1.24)
GFR calc Af Amer: >60 mL/min (ref >60)
GFR calc non Af Amer: >60 mL/min (ref >60)
Glucose, Bld: 79 mg/dL (ref 70–99)
Potassium: 4 mmol/L (ref 3.5–5.1)
Sodium: 137 mmol/L (ref 135–145)
Total Bilirubin: 2.5 mg/dL — ABNORMAL HIGH (ref 0.3–1.2)
Total Protein: 5.9 g/dL — ABNORMAL LOW (ref 6.5–8.1)

## 2020-01-01 LAB — TROPONIN I (HIGH SENSITIVITY)
Troponin I (High Sensitivity): 11 ng/L (ref <18)
Troponin I (High Sensitivity): 11 ng/L (ref <18)

## 2020-01-01 LAB — MAGNESIUM: Magnesium: 1.8 mg/dL (ref 1.7–2.4)

## 2020-01-01 LAB — LACTIC ACID, PLASMA: Lactic Acid, Venous: 0.9 mmol/L (ref 0.5–1.9)

## 2020-01-01 MED ORDER — FENTANYL CITRATE (PF) 100 MCG/2ML IJ SOLN
12.5000 ug | INTRAMUSCULAR | Status: DC | PRN
  Administered 2020-01-01 – 2020-01-02 (×5): 12.5 ug via INTRAVENOUS
  Filled 2020-01-01 (×4): qty 2

## 2020-01-01 MED ORDER — FENTANYL CITRATE (PF) 100 MCG/2ML IJ SOLN
12.5000 ug | Freq: Once | INTRAMUSCULAR | Status: DC
  Filled 2020-01-01: qty 2

## 2020-01-01 MED ORDER — CHLORDIAZEPOXIDE HCL 25 MG PO CAPS
25.0000 mg | ORAL_CAPSULE | Freq: Three times a day (TID) | ORAL | Status: DC
  Administered 2020-01-01 – 2020-01-03 (×7): 25 mg via ORAL
  Filled 2020-01-01 (×7): qty 1

## 2020-01-01 MED ORDER — PIPERACILLIN-TAZOBACTAM 3.375 G IVPB
3.3750 g | Freq: Three times a day (TID) | INTRAVENOUS | Status: DC
  Administered 2020-01-01 – 2020-01-02 (×3): 3.375 g via INTRAVENOUS
  Filled 2020-01-01 (×4): qty 50
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
## 2020-01-01 NOTE — Plan of Care (Signed)

## 2020-01-01 NOTE — ED Notes (Signed)
Informed Tresa Endo, RN of bed assignment

## 2020-01-01 NOTE — Progress Notes (Signed)
Pharmacy Antibiotic Note  Chad [name] is a 69 y.o. male admitted on 12/31/2019 with intra-abdominal infection.  Pharmacy has been consulted for Zosyn dosing.  Plan: Zosyn 3.375g IV q8h (4 hour infusion).  Height: 5\' 8"  (172.7 cm) Weight: 71.5 kg (157 lb 10.1 oz) IBW/kg (Calculated) : 68.4  Temp (24hrs), Avg:98.1 F (36.7 C), Min:97.7 F (36.5 C), Max:98.2 F (36.8 C)  Recent Labs  Lab 12/31/19 1012 12/31/19 1322 12/31/19 1548 12/31/19 1812 01/01/20 1034  WBC 9.1  --   --   --   --   CREATININE 0.59*  --   --   --  0.70  LATICACIDVEN  --  4.3* 1.9 1.1 0.9    Estimated Creatinine Clearance: 86.7 mL/min (by C-G formula based on SCr of 0.7 mg/dL).    Allergies  Allergen Reactions  . Morphine And Related Other (See Comments)    Causes "bad disposition"  . Ibuprofen Other (See Comments)    Gi upset    Antimicrobials this admission: Zosyn 9/12 >>   Dose adjustments this admission:  Microbiology results:  Thank you for allowing pharmacy to be a part of this patient's care.  11/12, PharmD, BCPS 01/01/2020 6:10 PM

## 2020-01-01 NOTE — Hospital Course (Addendum)
Tydus Sanmiguel is a 69 y.o. 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  He presented to the ED on 12/31/19 with abdominal/epigastric and chest pain since the day before.  He reported blood in vomitus and stool. Since yesterday had hematemesis twice and BRBPR x 3.  He presented similarly in August, was seen by GI, Dr. Maximino Greenland, outpatient follow up was recommended.  He had not yet followed up due to lack of transportation. Reports using [employer] or [employer] frequently for headaches recently.    Evaluation in the ED was essentially unremarkable other than EtOH level 115, lactic acid initially 4.3, RDW elevated 16.4.  Admitted to hospitalist service with GI consulted for further evaluation and management.

## 2020-01-01 NOTE — Progress Notes (Addendum)
PROGRESS NOTE    Chad [name]   ONG:[id number]  DOB: [birth year]  PCP: Patient, No Pcp Per    DOA: 12/31/2019 LOS: 2   Brief Narrative   Chad [name] is a 69 y.o. 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  He presented to the ED on 12/31/19 with abdominal/epigastric and chest pain since the day before.  He reported blood in vomitus and stool. Since yesterday had hematemesis twice and BRBPR x 3.  He presented similarly in August, was seen by GI, Dr. Maximino Greenland, outpatient follow up was recommended.  He had not yet followed up due to lack of transportation. Reports using [employer] or [employer] frequently for headaches recently.    Evaluation in the ED was essentially unremarkable other than EtOH level 115, lactic acid initially 4.3, RDW elevated 16.4.  Admitted to hospitalist service with GI consulted for further evaluation and management.     Assessment & Plan   Principal Problem:   GI bleed Active Problems:   HTN (hypertension)   Alcohol dependence with uncomplicated withdrawal (HCC)   Anxiety and depression   Chest pain   Tobacco abuse   Epigastric pain   GI bleeding - presented with history of hematemesis and BRBPR.  No episodes since admission. --GI consulted --Continue IV PPI BID --2 large bore IV's --transfuse if Hbg < 8 --trend H&H's --no NSAID's --clear liquid diet for now, NPO at midnight --plan for EGD tomorrow --page GI if acute changes in hemodynamic status or signs of active bleeding  Alcohol dependence with uncomplicated withdrawl  - Counseled on importance of cessation for overall health and wellbeing.  Will have TOC provide resource information.  Patient denies hx of seizures, hallucinations or severe DT's during withdrawal (not that he recalls).  EtOH 115 on presentation. --CIWA protocol with PRN Ativan.  --Monitor closely.  --Start Librium 25 mg PO TID. --Continue thiamine  and folate  Epigastric and Chest pain - suspect PUD / gastritis / esophagitis given heavy liquor consumption daily.  Also with RUQ pain and guarding.  RUQ ultrasound obtained given Tbili rising, showed gallstone in neck of the gallbladder. --Surgery consulted --may need HIDA --antiemetics and analgesics PRN  Hypertension - continue home amlodipine  Anxiety and depression - continue home trazodone and Lexapro, hold Vistaril while getting benzo's  Tobacco abuse - nicotine patch.  Counseled on cessation.  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  Monitor BMP.   Patient BMI: Body mass index is 23.97 kg/m.   DVT prophylaxis: SCDs Start: 12/31/19 1628   Diet:  Diet Orders (From admission, onward)     Start     Ordered   01/02/20 1100  Diet NPO time specified  Diet effective now        01/02/20 1059              Code Status: Full Code    Subjective 01/02/20    Patient seen at bedside.  Asks for chicken broth.  Reports significant epigastric/chest pain, asking for pain medicine.  No nausea or vomiting or bleeding since admission.  Says he is having some hand tremors and anxiety but otherwise feeling okay from withdrawal standpoint.  Says he does not recall ever having seizure when he stops drinking.   Drinks 3/4 to 1 pint of liquor daily.   Disposition Plan & Communication   Status is: Inpatient  Remains inpatient appropriate because:Ongoing diagnostic testing  needed not appropriate for outpatient work up   Dispo: The patient is from: Home              Anticipated d/c is to: Home              Anticipated d/c date is: 2 days              Patient currently is not medically stable to d/c.    Family Communication: none at bedside, will attempt to call    Consults, Procedures, Significant Events   Consultants:  Gastroenterology General Surgery  Procedures:  None  Antimicrobials:  None   Objective   Vitals:   01/02/20 1034 01/02/20 1044 01/02/20 1054  01/02/20 1155  BP: 112/74 125/90 (!) 133/92 126/89  Pulse: 83 89 85 88  Resp: 17 18 20 18   Temp: (!) 97.1 F (36.2 C)   97.8 F (36.6 C)  TempSrc: Tympanic   Oral  SpO2: 96% 96% 97% 97%  Weight:      Height:        Intake/Output Summary (Last 24 hours) at 01/02/2020 1236 Last data filed at 01/02/2020 1108 Gross per 24 hour  Intake 706.98 ml  Output 750 ml  Net -43.02 ml   Filed Weights   12/31/19 0853 01/01/20 0629  Weight: 72.6 kg 71.5 kg    Physical Exam:  General exam: awake, alert, no acute distress, chronically ill appearing Respiratory system: CTAB but diminished, no wheezes or rhonchi, normal respiratory effort. Cardiovascular system: normal S1/S2, RRR, no pedal edema.   Gastrointestinal system: epigastric and RUQ tender on palpation, voluntary guarding on light RUQ palpation, no rebound tenderness, +bowel sounds. Central nervous system: A&O x4. no gross focal neurologic deficits, normal speech Extremities: moves all, no cyanosis, normal tone Psychiatry: normal mood, flat affect, no apparent hallucinations  Labs   Data Reviewed: I have personally reviewed following labs and imaging studies  CBC: Recent Labs  Lab 12/31/19 1012 12/31/19 1812 01/01/20 0207 01/01/20 1034 01/01/20 1627 01/01/20 2221 01/02/20 0425  WBC 9.1  --   --   --   --   --  6.8  HGB 15.2   < > 12.5* 13.0 12.5* 12.2* 13.4  HCT 44.2   < > 37.6* 38.5* 37.5* 35.6* 38.3*  MCV 90.0  --   --   --   --   --  88.2  PLT 274  --   --   --   --   --  182   < > = values in this interval not displayed.   Basic Metabolic Panel: Recent Labs  Lab 12/31/19 1012 01/01/20 1034 01/02/20 0425  NA 135 137 136  K 4.1 4.0 3.9  CL 98 98 100  CO2 20* 24 26  GLUCOSE 89 79 96  BUN 10 7* <5*  CREATININE 0.59* 0.70 0.68  CALCIUM 9.2 8.9 9.1  MG  --  1.8  --    GFR: Estimated Creatinine Clearance: 86.7 mL/min (by C-G formula based on SCr of 0.68 mg/dL). Liver Function Tests: Recent Labs  Lab  12/31/19 1012 01/01/20 1034 01/02/20 0425  AST 37 35 30  ALT 15 14 16   ALKPHOS 67 57 59  BILITOT 1.3* 2.5* 1.8*  PROT 7.7 5.9* 6.3*  ALBUMIN 4.6 3.6 3.6   Recent Labs  Lab 12/31/19 1012  LIPASE 24   No results for input(s): AMMONIA in the last 168 hours. Coagulation Profile: Recent Labs  Lab 12/31/19 1322  INR 1.0  Cardiac Enzymes: No results for input(s): CKTOTAL, CKMB, CKMBINDEX, TROPONINI in the last 168 hours. BNP (last 3 results) No results for input(s): PROBNP in the last 8760 hours. HbA1C: No results for input(s): HGBA1C in the last 72 hours. CBG: No results for input(s): GLUCAP in the last 168 hours. Lipid Profile: No results for input(s): CHOL, HDL, LDLCALC, TRIG, CHOLHDL, LDLDIRECT in the last 72 hours. Thyroid Function Tests: Recent Labs    12/31/19 1812  TSH 0.463  FREET4 1.09   Anemia Panel: No results for input(s): VITAMINB12, FOLATE, FERRITIN, TIBC, IRON, RETICCTPCT in the last 72 hours. Sepsis Labs: Recent Labs  Lab 12/31/19 1322 12/31/19 1548 12/31/19 1812 01/01/20 1034  LATICACIDVEN 4.3* 1.9 1.1 0.9    Recent Results (from the past 240 hour(s))  SARS Coronavirus 2 by RT PCR (hospital order, performed in [facility] hospital lab) Nasopharyngeal Nasopharyngeal Swab     Status: None   Collection Time: 12/31/19  1:21 PM   Specimen: Nasopharyngeal Swab  Result Value Ref Range Status   SARS Coronavirus 2 NEGATIVE NEGATIVE Final    Comment: (NOTE) SARS-CoV-2 target nucleic acids are NOT DETECTED.  The SARS-CoV-2 RNA is generally detectable in upper and lower respiratory specimens during the acute phase of infection. The lowest concentration of SARS-CoV-2 viral copies this assay can detect is 250 copies / mL. A negative result does not preclude SARS-CoV-2 infection and should not be used as the sole basis for treatment or other patient management decisions.  A negative result may occur with improper specimen collection / handling,  submission of specimen other than nasopharyngeal swab, presence of viral mutation(s) within the areas targeted by this assay, and inadequate number of viral copies (<250 copies / mL). A negative result must be combined with clinical observations, patient history, and epidemiological information.  Fact Sheet for Patients:   BoilerBrush.com.cy  Fact Sheet for Healthcare Providers: https://pope.com/  This test is not yet approved or  cleared by the [place] FDA and has been authorized for detection and/or diagnosis of SARS-CoV-2 by FDA under an Emergency Use Authorization (EUA).  This EUA will remain in effect (meaning this test can be used) for the duration of the COVID-19 declaration under Section 564(b)(1) of the Act, 21 U.S.C. section 360bbb-3(b)(1), unless the authorization is terminated or revoked sooner.  Performed at [facility], [address] [place], [place] [address]       Imaging Studies   CT ABDOMEN PELVIS W CONTRAST  Result Date: 12/31/2019 CLINICAL DATA:  Abdominal pain with rectal bleeding. EXAM: CT ABDOMEN AND PELVIS WITH CONTRAST TECHNIQUE: Multidetector CT imaging of the abdomen and pelvis was performed using the standard protocol following bolus administration of intravenous contrast. CONTRAST:  OMNIPAQUE IOHEXOL 300 MG/ML  SOLN COMPARISON:  12/09/2019 FINDINGS: Lower chest: The lung bases are clear. The heart size is normal. Hepatobiliary: There is decreased hepatic attenuation suggestive of hepatic steatosis. Cholelithiasis without acute inflammation.There is no biliary ductal dilation. Pancreas: Normal contours without ductal dilatation. No peripancreatic fluid collection. Spleen: Unremarkable. Adrenals/Urinary Tract: --Adrenal glands: Unremarkable. --Right kidney/ureter: No hydronephrosis or radiopaque kidney stones. --Left kidney/ureter: There is a punctate nonobstructing stone in the lower pole  the left kidney. --Urinary bladder: Unremarkable. Stomach/Bowel: --Stomach/Duodenum: No hiatal hernia or other gastric abnormality. Normal duodenal course and caliber. --Small bowel: Unremarkable. --Colon: Rectosigmoid diverticulosis without acute inflammation. --Appendix: Normal. Vascular/Lymphatic: Atherosclerotic calcification is present within the non-aneurysmal abdominal aorta, without hemodynamically significant stenosis. --No retroperitoneal lymphadenopathy. --No mesenteric lymphadenopathy. --No pelvic or  inguinal lymphadenopathy. Reproductive: Unremarkable Other: No ascites or free air. The abdominal wall is normal. Musculoskeletal. Multilevel degenerative changes are noted throughout the lumbar spine. The patient is status post prior L4-L5 posterior fusion. The hardware is intact. 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 IMPRESSION: 1. Rectosigmoid diverticulosis without acute inflammation. 2. Hepatic steatosis. 3. Cholelithiasis without acute inflammation. 4. Punctate nonobstructing stone in the lower pole the left kidney. 5. 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 Aortic Atherosclerosis (ICD10-I70.0). Electronically Signed   By: Katherine Mantle M.D.   On: 12/31/2019 15:01   US Abdomen Limited RUQ  Result Date: 01/01/2020 CLINICAL DATA:  Elevated bilirubin. EXAM: ULTRASOUND ABDOMEN LIMITED RIGHT UPPER QUADRANT COMPARISON:  CT dated 12/31/2019 FINDINGS: Gallbladder: Multiple gallstones are noted. There is no gallbladder wall thickening. There is no pericholecystic free fluid. There appears to be a gallstone wedged at the gallbladder neck. The sonographic Eulah Pont sign is reported as positive. Common bile duct: Diameter: 6 mm Liver: Diffuse increased echogenicity with slightly heterogeneous liver. Appearance typically secondary to fatty infiltration. Fibrosis secondary consideration. No secondary findings of cirrhosis noted. No focal hepatic lesion or  intrahepatic biliary duct dilatation. Portal vein is patent on color Doppler imaging with normal direction of blood flow towards the liver. Other: None. IMPRESSION: Sonographic findings are equivocal for calculus acute cholecystitis. Consider HIDA scan for further evaluation. Electronically Signed   By: Katherine Mantle M.D.   On: 01/01/2020 14:51      Medications   Scheduled Meds:  chlordiazePOXIDE  25 mg Oral TID   escitalopram  20 mg Oral Daily   fentaNYL (SUBLIMAZE) injection  12.5 mcg Intravenous Once   folic acid  1 mg Oral Daily   LORazepam  0-4 mg Intravenous Q6H   Or   LORazepam  0-4 mg Oral Q6H   LORazepam  0-4 mg Intravenous Q12H   Or   LORazepam  0-4 mg Oral Q12H   multivitamin with minerals  1 tablet Oral Daily   nicotine  21 mg Transdermal Daily   ondansetron (ZOFRAN) IV  4 mg Intravenous Once   pantoprazole  40 mg Oral Daily   thiamine  100 mg Oral Daily   Or   thiamine  100 mg Intravenous Daily   Continuous Infusions:  sodium chloride 100 mL/hr at 01/02/20 1211   piperacillin-tazobactam (ZOSYN)  IV 3.375 g (01/02/20 1211)       LOS: 2 days    Time spent: 30 minutes    Pennie Banter, DO [facility]  01/02/2020, 12:36 PM    If 7PM-7AM, please contact night-coverage. How to contact the [facility] Attending or Consulting provider 7A - 7P or covering provider during after hours 7P -7A, for this patient?    Check the care team in [facility] and look for a) attending/consulting [facility] provider listed and b) the [facility] team listed Log into [email or web address] and use [place] universal password to access. If you do not have the password, please contact the hospital operator. Locate the [facility] provider you are looking for under [facility] and page to a number that you can be directly reached. If you still have difficulty reaching the provider, please page the [facility] (Director on Call) for the Hospitalists listed on amion for assistance.

## 2020-01-01 NOTE — Progress Notes (Signed)
Right upper quadrant ultrasound shows gallbladder neck stone and Murphy sign positive.  Findings are reported to be equivocal for calculus acute cholecystitis.  I have informed primary attending, Dr. Denton Lank of the findings and recommendations are HIDA scan or surgery consult

## 2020-01-02 ENCOUNTER — Inpatient Hospital Stay: Payer: Medicare HMO | Admitting: Certified Registered"

## 2020-01-02 ENCOUNTER — Inpatient Hospital Stay: Payer: Medicare HMO | Admitting: Anesthesiology

## 2020-01-02 ENCOUNTER — Encounter: Payer: Self-pay | Admitting: Internal Medicine

## 2020-01-02 ENCOUNTER — Encounter
Admission: EM | Disposition: A | Discharge status: Skilled Nursing Facility | Payer: Self-pay | Source: Home / Self Care | Attending: Internal Medicine

## 2020-01-02 DIAGNOSIS — K810 Acute cholecystitis: Secondary | ICD-10-CM

## 2020-01-02 DIAGNOSIS — K8000 Calculus of gallbladder with acute cholecystitis without obstruction: Secondary | ICD-10-CM

## 2020-01-02 DIAGNOSIS — K922 Gastrointestinal hemorrhage, unspecified: Secondary | ICD-10-CM

## 2020-01-02 HISTORY — PX: ESOPHAGOGASTRODUODENOSCOPY: SHX5428

## 2020-01-02 HISTORY — PX: CHOLECYSTECTOMY: SHX55

## 2020-01-02 LAB — COMPREHENSIVE METABOLIC PANEL
ALT: 16 U/L (ref 0–44)
AST: 30 U/L (ref 15–41)
Albumin: 3.6 g/dL (ref 3.5–5.0)
Alkaline Phosphatase: 59 U/L (ref 38–126)
Anion gap: 10 (ref 5–15)
BUN: <5 mg/dL — ABNORMAL LOW (ref 8–23)
CO2: 26 mmol/L (ref 22–32)
Calcium: 9.1 mg/dL (ref 8.9–10.3)
Chloride: 100 mmol/L (ref 98–111)
Creatinine, Ser: 0.68 mg/dL (ref 0.61–1.24)
GFR calc Af Amer: >60 mL/min (ref >60)
GFR calc non Af Amer: >60 mL/min (ref >60)
Glucose, Bld: 96 mg/dL (ref 70–99)
Potassium: 3.9 mmol/L (ref 3.5–5.1)
Sodium: 136 mmol/L (ref 135–145)
Total Bilirubin: 1.8 mg/dL — ABNORMAL HIGH (ref 0.3–1.2)
Total Protein: 6.3 g/dL — ABNORMAL LOW (ref 6.5–8.1)

## 2020-01-02 LAB — CBC
HCT: 38.3 % — ABNORMAL LOW (ref 39.0–52.0)
Hemoglobin: 13.4 g/dL (ref 13.0–17.0)
MCH: 30.9 pg (ref 26.0–34.0)
MCHC: 35 g/dL (ref 30.0–36.0)
MCV: 88.2 fL (ref 80.0–100.0)
Platelets: 182 10*3/uL (ref 150–400)
RBC: 4.34 MIL/uL (ref 4.22–5.81)
RDW: 15.7 % — ABNORMAL HIGH (ref 11.5–15.5)
WBC: 6.8 10*3/uL (ref 4.0–10.5)
nRBC: 0 % (ref 0.0–0.2)

## 2020-01-02 SURGERY — LAPAROSCOPIC CHOLECYSTECTOMY
Anesthesia: General

## 2020-01-02 SURGERY — EGD (ESOPHAGOGASTRODUODENOSCOPY)
Anesthesia: General

## 2020-01-02 MED ORDER — MIDAZOLAM HCL 2 MG/2ML IJ SOLN
INTRAMUSCULAR | Status: AC
  Filled 2020-01-02: qty 2

## 2020-01-02 MED ORDER — ROCURONIUM BROMIDE 10 MG/ML (PF) SYRINGE
PREFILLED_SYRINGE | INTRAVENOUS | Status: AC
  Filled 2020-01-02: qty 10

## 2020-01-02 MED ORDER — SUGAMMADEX SODIUM 200 MG/2ML IV SOLN
INTRAVENOUS | Status: DC | PRN
  Administered 2020-01-02: 200 mg via INTRAVENOUS

## 2020-01-02 MED ORDER — FENTANYL CITRATE (PF) 100 MCG/2ML IJ SOLN
INTRAMUSCULAR | Status: DC | PRN
Start: 2020-01-02 — End: 2020-01-02
  Administered 2020-01-02: 100 ug via INTRAVENOUS

## 2020-01-02 MED ORDER — LIDOCAINE-EPINEPHRINE 1 %-1:100000 IJ SOLN
INTRAMUSCULAR | Status: DC | PRN
  Administered 2020-01-02: 20 mL via INTRAMUSCULAR

## 2020-01-02 MED ORDER — SODIUM CHLORIDE 0.9 % IV SOLN
INTRAVENOUS | Status: DC
  Administered 2020-01-02: 20 mL/h via INTRAVENOUS

## 2020-01-02 MED ORDER — FENTANYL CITRATE (PF) 100 MCG/2ML IJ SOLN
INTRAMUSCULAR | Status: AC
  Administered 2020-01-02: 25 ug via INTRAVENOUS
  Filled 2020-01-02: qty 2

## 2020-01-02 MED ORDER — GLYCOPYRROLATE 0.2 MG/ML IJ SOLN
INTRAMUSCULAR | Status: DC | PRN
  Administered 2020-01-02: .2 mg via INTRAVENOUS

## 2020-01-02 MED ORDER — SUCCINYLCHOLINE CHLORIDE 20 MG/ML IJ SOLN
INTRAMUSCULAR | Status: DC | PRN
  Administered 2020-01-02: 100 mg via INTRAVENOUS

## 2020-01-02 MED ORDER — SUCCINYLCHOLINE CHLORIDE 200 MG/10ML IV SOSY
PREFILLED_SYRINGE | INTRAVENOUS | Status: AC
  Filled 2020-01-02: qty 10

## 2020-01-02 MED ORDER — "VISTASEAL 4 ML SINGLE DOSE KIT "
PACK | CUTANEOUS | Status: DC | PRN
  Administered 2020-01-02: 8 mL via TOPICAL

## 2020-01-02 MED ORDER — ONDANSETRON HCL 4 MG/2ML IJ SOLN: 4.0000 mg | Freq: Once | INTRAMUSCULAR | Status: DC | PRN

## 2020-01-02 MED ORDER — LIDOCAINE HCL (PF) 2 % IJ SOLN
INTRAMUSCULAR | Status: AC
  Filled 2020-01-02: qty 10

## 2020-01-02 MED ORDER — DEXAMETHASONE SODIUM PHOSPHATE 10 MG/ML IJ SOLN
INTRAMUSCULAR | Status: AC
  Filled 2020-01-02: qty 1

## 2020-01-02 MED ORDER — METOPROLOL TARTRATE 5 MG/5ML IV SOLN
INTRAVENOUS | Status: DC | PRN
  Administered 2020-01-02: 3 mg via INTRAVENOUS
  Administered 2020-01-02: 2 mg via INTRAVENOUS

## 2020-01-02 MED ORDER — GLYCOPYRROLATE 0.2 MG/ML IJ SOLN
INTRAMUSCULAR | Status: AC
  Filled 2020-01-02: qty 1

## 2020-01-02 MED ORDER — ACETAMINOPHEN 325 MG PO TABS: 650.0000 mg | ORAL_TABLET | Freq: Four times a day (QID) | ORAL | Status: DC | PRN

## 2020-01-02 MED ORDER — PROPOFOL 10 MG/ML IV BOLUS
INTRAVENOUS | Status: DC | PRN
  Administered 2020-01-02: 50 mg via INTRAVENOUS

## 2020-01-02 MED ORDER — LIDOCAINE-EPINEPHRINE 1 %-1:100000 IJ SOLN
INTRAMUSCULAR | Status: AC
  Filled 2020-01-02: qty 1

## 2020-01-02 MED ORDER — ONDANSETRON HCL 4 MG/2ML IJ SOLN
INTRAMUSCULAR | Status: AC
  Filled 2020-01-02: qty 2

## 2020-01-02 MED ORDER — SODIUM CHLORIDE 0.9 % IV SOLN: INTRAVENOUS | Status: DC

## 2020-01-02 MED ORDER — ONDANSETRON HCL 4 MG/2ML IJ SOLN
INTRAMUSCULAR | Status: DC | PRN
  Administered 2020-01-02: 4 mg via INTRAVENOUS

## 2020-01-02 MED ORDER — PROPOFOL 10 MG/ML IV BOLUS
INTRAVENOUS | Status: DC | PRN
  Administered 2020-01-02: 20 mg via INTRAVENOUS
  Administered 2020-01-02: 100 mg via INTRAVENOUS

## 2020-01-02 MED ORDER — FENTANYL CITRATE (PF) 100 MCG/2ML IJ SOLN
INTRAMUSCULAR | Status: AC
  Filled 2020-01-02: qty 2

## 2020-01-02 MED ORDER — PHENYLEPHRINE HCL (PRESSORS) 10 MG/ML IV SOLN
INTRAVENOUS | Status: DC | PRN
  Administered 2020-01-02: 100 ug via INTRAVENOUS

## 2020-01-02 MED ORDER — METOPROLOL TARTRATE 5 MG/5ML IV SOLN
INTRAVENOUS | Status: AC
  Filled 2020-01-02: qty 5

## 2020-01-02 MED ORDER — PHENYLEPHRINE HCL (PRESSORS) 10 MG/ML IV SOLN
INTRAVENOUS | Status: AC
  Filled 2020-01-02: qty 1

## 2020-01-02 MED ORDER — PANTOPRAZOLE SODIUM 40 MG PO TBEC
40.0000 mg | DELAYED_RELEASE_TABLET | Freq: Every day | ORAL | Status: DC
  Administered 2020-01-02 – 2020-01-06 (×5): 40 mg via ORAL
  Filled 2020-01-02 (×6): qty 1

## 2020-01-02 MED ORDER — LIDOCAINE HCL (CARDIAC) PF 100 MG/5ML IV SOSY
PREFILLED_SYRINGE | INTRAVENOUS | Status: DC | PRN
  Administered 2020-01-02: 50 mg via INTRAVENOUS

## 2020-01-02 MED ORDER — HEMOSTATIC AGENTS (NO CHARGE) OPTIME
TOPICAL | Status: DC | PRN
  Administered 2020-01-02: 1 via TOPICAL

## 2020-01-02 MED ORDER — FENTANYL CITRATE (PF) 100 MCG/2ML IJ SOLN
25.0000 ug | INTRAMUSCULAR | Status: DC | PRN
  Administered 2020-01-02 (×5): 25 ug via INTRAVENOUS

## 2020-01-02 MED ORDER — BUPIVACAINE HCL (PF) 0.25 % IJ SOLN
INTRAMUSCULAR | Status: AC
  Filled 2020-01-02: qty 30

## 2020-01-02 MED ORDER — OXYCODONE HCL 5 MG PO TABS
5.0000 mg | ORAL_TABLET | ORAL | Status: DC | PRN
  Administered 2020-01-02 – 2020-01-06 (×16): 5 mg via ORAL
  Filled 2020-01-02 (×16): qty 1

## 2020-01-02 MED ORDER — ROCURONIUM BROMIDE 100 MG/10ML IV SOLN
INTRAVENOUS | Status: DC | PRN
  Administered 2020-01-02 (×2): 10 mg via INTRAVENOUS
  Administered 2020-01-02: 40 mg via INTRAVENOUS

## 2020-01-02 MED ORDER — MIDAZOLAM HCL 2 MG/2ML IJ SOLN
INTRAMUSCULAR | Status: DC | PRN
  Administered 2020-01-02: 2 mg via INTRAVENOUS

## 2020-01-02 SURGICAL SUPPLY — 51 items
"PENCIL ELECTRO HAND CTR " (MISCELLANEOUS) ×1 IMPLANT
APPLICATOR VISTASEAL 35 (MISCELLANEOUS) ×1 IMPLANT
APPLIER CLIP 5 13 M/L LIGAMAX5 (MISCELLANEOUS) ×2
BLADE SURG SZ11 CARB STEEL (BLADE) ×2 IMPLANT
CANISTER SUCT 1200ML W/VALVE (MISCELLANEOUS) ×2 IMPLANT
CATH CHOLANGI 4FR 420404F (CATHETERS) IMPLANT
CHLORAPREP W/TINT 26 (MISCELLANEOUS) ×2 IMPLANT
CLIP APPLIE 5 13 M/L LIGAMAX5 (MISCELLANEOUS) ×1 IMPLANT
COVER WAND RF STERILE (DRAPES) ×2 IMPLANT
DECANTER SPIKE VIAL GLASS SM (MISCELLANEOUS) ×4 IMPLANT
DEFOGGER SCOPE WARMER CLEARIFY (MISCELLANEOUS) ×2 IMPLANT
DERMABOND ADVANCED (GAUZE/BANDAGES/DRESSINGS) ×1
DERMABOND ADVANCED .7 DNX12 (GAUZE/BANDAGES/DRESSINGS) ×1 IMPLANT
ELECT CAUTERY BLADE TIP 2.5 (TIP) ×2
ELECT REM PT RETURN 9FT ADLT (ELECTROSURGICAL) ×2
ELECTRODE CAUTERY BLDE TIP 2.5 (TIP) ×1 IMPLANT
ELECTRODE REM PT RTRN 9FT ADLT (ELECTROSURGICAL) ×1 IMPLANT
GLOVE BIO SURGEON STRL SZ 6.5 (GLOVE) ×6 IMPLANT
GLOVE INDICATOR 7.0 STRL GRN (GLOVE) ×6 IMPLANT
GOWN STRL REUS W/ TWL LRG LVL3 (GOWN DISPOSABLE) ×3 IMPLANT
GOWN STRL REUS W/TWL LRG LVL3 (GOWN DISPOSABLE) ×3
GRASPER SUT TROCAR 14GX15 (MISCELLANEOUS) IMPLANT
HEMOSTAT SURGICEL 2X3 (HEMOSTASIS) ×1 IMPLANT
IRRIGATION STRYKERFLOW (MISCELLANEOUS) ×1 IMPLANT
IRRIGATOR STRYKERFLOW (MISCELLANEOUS) ×2
IV CATH ANGIO 12GX3 LT BLUE (NEEDLE) IMPLANT
IV NS 1000ML (IV SOLUTION) ×1
IV NS 1000ML BAXH (IV SOLUTION) ×1 IMPLANT
KIT TURNOVER KIT A (KITS) ×2 IMPLANT
KITTNER LAPARASCOPIC 5X40 (MISCELLANEOUS) IMPLANT
LABEL OR SOLS (LABEL) ×2 IMPLANT
NEEDLE HYPO 22GX1.5 SAFETY (NEEDLE) ×2 IMPLANT
NS IRRIG 500ML POUR BTL (IV SOLUTION) ×2 IMPLANT
PACK LAP CHOLECYSTECTOMY (MISCELLANEOUS) ×2 IMPLANT
PENCIL ELECTRO HAND CTR (MISCELLANEOUS) ×2 IMPLANT
POUCH SPECIMEN RETRIEVAL 10MM (ENDOMECHANICALS) ×2 IMPLANT
SCISSORS METZENBAUM CVD 33 (INSTRUMENTS) ×2 IMPLANT
SET TUBE SMOKE EVAC HIGH FLOW (TUBING) ×2 IMPLANT
SLEEVE ADV FIXATION 5X100MM (TROCAR) ×4 IMPLANT
SOLUTION ELECTROLUBE (MISCELLANEOUS) ×2 IMPLANT
STRIP CLOSURE SKIN 1/2X4 (GAUZE/BANDAGES/DRESSINGS) ×2 IMPLANT
SUT MNCRL 4-0 (SUTURE) ×1
SUT MNCRL 4-0 27XMFL (SUTURE) ×1
SUT VIC AB 3-0 SH 27 (SUTURE) ×1
SUT VIC AB 3-0 SH 27X BRD (SUTURE) ×1 IMPLANT
SUT VICRYL 0 AB UR-6 (SUTURE) ×4 IMPLANT
SUTURE MNCRL 4-0 27XMF (SUTURE) ×1 IMPLANT
SYS KII FIOS ACCESS ABD 5X100 (TROCAR) ×2
SYSTEM KII FIOS ACES ABD 5X100 (TROCAR) ×1 IMPLANT
TROCAR ADV FIXATION 12X100MM (TROCAR) ×2 IMPLANT
WATER STERILE IRR 1000ML POUR (IV SOLUTION) ×2 IMPLANT

## 2020-01-02 NOTE — Anesthesia Preprocedure Evaluation (Addendum)
Anesthesia Evaluation  Patient identified by MRN, date of birth, ID band Patient awake  General Assessment Comment:Agitated, says he is in alcohol withdrawal  Reviewed: Allergy & Precautions, H&P , NPO status , Patient's Chart, lab work & pertinent test results  History of Anesthesia Complications Negative for: history of anesthetic complications  Airway Mallampati: II  TM Distance: >3 FB Neck ROM: full    Dental  (+) Poor Dentition, Missing   Pulmonary neg sleep apnea, neg COPD, Current Smoker and Patient abstained from smoking.,    Pulmonary exam normal breath sounds clear to auscultation       Cardiovascular Exercise Tolerance: Good METShypertension, On Medications (-) CAD and (-) Past MI Normal cardiovascular exam(-) dysrhythmias  Rhythm:regular Rate:Normal - Systolic murmurs    Neuro/Psych  Headaches, Seizures -,  PSYCHIATRIC DISORDERS Anxiety Depression Alcohol withdrawal seizures    GI/Hepatic GERD  ,(+)     substance abuse  alcohol use, Hx of alcohol withdrawal, says he has had seizures before from it Last drink 3 days ago Concern for variceal / UGI bleed   Endo/Other  negative endocrine ROSneg diabetes  Renal/GU CRFRenal disease     Musculoskeletal   Abdominal   Peds  Hematology negative hematology ROS (+)   Anesthesia Other Findings Past Medical History: No date: Anxiety     Comment:  takes lexapro No date: Chronic kidney disease     Comment:  patient states he was told he has 'moderate kidney               disease' No date: Degenerative disc disease, lumbar No date: Exposure to hepatitis C No date: GERD (gastroesophageal reflux disease)     Comment:  takes OTC as needed - omeprazole No date: Headache     Comment:  on occasion No date: History of gallstones No date: Hypertension     Comment:  diet controlled approx 15 years ago: Pneumonia  Reproductive/Obstetrics negative OB ROS                              Anesthesia Physical  Anesthesia Plan  ASA: III  Anesthesia Plan: General   Post-op Pain Management:    Induction: Intravenous  PONV Risk Score and Plan: 2 and Ondansetron, Propofol infusion and TIVA  Airway Management Planned: Oral ETT  Additional Equipment: None  Intra-op Plan:   Post-operative Plan: Extubation in OR  Informed Consent: I have reviewed the patients History and Physical, chart, labs and discussed the procedure including the risks, benefits and alternatives for the proposed anesthesia with the patient or authorized representative who has indicated his/her understanding and acceptance.     Dental Advisory Given  Plan Discussed with: Anesthesiologist, CRNA and Surgeon  Anesthesia Plan Comments: ( Hematemesis and melena 3 days ago. No vomiting since then while admitted, no hematemesis. Hgb has been stable.   Discussed risks of anesthesia with patient, including possibility of difficulty with spontaneous ventilation under anesthesia necessitating airway intervention, aspiration, PONV, and rare risks such as cardiac or respiratory or neurological events. Patient understands.)        Anesthesia Quick Evaluation

## 2020-01-02 NOTE — Progress Notes (Signed)
PROGRESS NOTE    Chad [name]   HWE:[id number]  DOB: [birth year]  PCP: Patient, No Pcp Per    DOA: 12/31/2019 LOS: 2   Brief Narrative   Chad [name] is a 69 y.o. 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  He presented to the ED on 12/31/19 with abdominal/epigastric and chest pain since the day before.  He reported blood in vomitus and stool. Since yesterday had hematemesis twice and BRBPR x 3.  He presented similarly in August, was seen by GI, Dr. Maximino Greenland, outpatient follow up was recommended.  He had not yet followed up due to lack of transportation. Reports using [employer] or [employer] frequently for headaches recently.    Evaluation in the ED was essentially unremarkable other than EtOH level 115, lactic acid initially 4.3, RDW elevated 16.4.  Admitted to hospitalist service with GI consulted for further evaluation and management.      Assessment & Plan   Principal Problem:   GI bleed Active Problems:   HTN (hypertension)   Alcohol dependence with uncomplicated withdrawal (HCC)   Anxiety and depression   Chest pain   Tobacco abuse   Epigastric pain    GI bleeding - presented with history of hematemesis and BRBPR.  No episodes since admission. EGD 9/13 - no source of bleeding found, esophageal mucosa suspicious for Barrett's esophagus, biopsy contraindicated --GI consulted --Continue IV PPI BID --2 large bore IV's --transfuse if Hbg < 8 --trend H&H's --no NSAID's --NPO per surgery (post-op advance diet as tolerated) --repeat EGD in 6 weeks --page GI if acute changes in hemodynamic status or signs of active bleeding  Acute cholecystitis - RUQ ultrasound obtained due to rise in Tbili, it showed gallstone in gallbladder neck, positive Murphy sign. General surgery consulted.  Plan is for lap chole with Dr. Lady Gary today pending OR/anesthesia availability.   --Continue Zosyn --Analgesics and  antiemetics PRN  Alcohol dependence with uncomplicated withdrawl  - Counseled on importance of cessation for overall health and wellbeing.  Will have TOC provide resource information.  Patient denies hx of seizures, hallucinations or severe DT's during withdrawal (not that he recalls).  EtOH 115 on presentation --CIWA protocol with PRN Ativan --Monitor closely --Librium 25 mg PO TID --Continue thiamine and folate  Epigastric and Chest pain - suspect PUD / gastritis / esophagitis given heavy liquor consumption daily.  Also with RUQ pain and guarding.  RUQ ultrasound obtained given Tbili rising, showed gallstone in neck of the gallbladder. --Surgery consulted --may need HIDA --antiemetics and analgesics PRN  Hypertension - continue home amlodipine  Anxiety and depression - continue home trazodone and Lexapro, hold Vistaril while getting benzo's  Tobacco abuse - nicotine patch.  Counseled on cessation.  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  Monitor BMP.   Patient BMI: Body mass index is 23.97 kg/m.   DVT prophylaxis: SCDs Start: 12/31/19 1628   Diet:  Diet Orders (From admission, onward)    Start     Ordered   01/02/20 1100  Diet NPO time specified  Diet effective now        01/02/20 1059            Code Status: Full Code    Subjective 01/02/20    Patient seen at bedside after EGD this AM.  He reports signficant abdominal pain and is asking to eat.  Says he is starving.  Reports good  control of withdrawal symptoms, tremors, anxiety.   Disposition Plan & Communication   Status is: Inpatient  Remains inpatient appropriate because:Ongoing diagnostic testing needed not appropriate for outpatient work up   Dispo: The patient is from: Home              Anticipated d/c is to: Home              Anticipated d/c date is: 2 days              Patient currently is not medically stable to d/c.    Family Communication: none at bedside, will attempt to call     Consults, Procedures, Significant Events   Consultants:   Gastroenterology  General Surgery  Procedures:   None  Antimicrobials:   None   Objective   Vitals:   01/02/20 1044 01/02/20 1054 01/02/20 1155 01/02/20 1316  BP: 125/90 (!) 133/92 126/89 (!) 147/89  Pulse: 89 85 88 (!) 55  Resp: 18 20 18 18   Temp:   97.8 F (36.6 C) 97.9 F (36.6 C)  TempSrc:   Oral Temporal  SpO2: 96% 97% 97% 97%  Weight:      Height:        Intake/Output Summary (Last 24 hours) at 01/02/2020 1618 Last data filed at 01/02/2020 1605 Gross per 24 hour  Intake 1806.98 ml  Output 800 ml  Net 1006.98 ml   Filed Weights   12/31/19 0853 01/01/20 0629  Weight: 72.6 kg 71.5 kg    Physical Exam:  General exam: awake, alert, no acute distress, chronically ill appearing Respiratory system: CTAB, diminished, no wheezes or rhonchi, normal respiratory effort. Cardiovascular system: normal S1/S2, RRR, no pedal edema.   Gastrointestinal system: tender on light palpation with guarding, no rebound tenderness, soft Central nervous system: A&O x4. no gross focal neurologic deficits, normal speech   Labs   Data Reviewed: I have personally reviewed following labs and imaging studies  CBC: Recent Labs  Lab 12/31/19 1012 12/31/19 1812 01/01/20 0207 01/01/20 1034 01/01/20 1627 01/01/20 2221 01/02/20 0425  WBC 9.1  --   --   --   --   --  6.8  HGB 15.2   < > 12.5* 13.0 12.5* 12.2* 13.4  HCT 44.2   < > 37.6* 38.5* 37.5* 35.6* 38.3*  MCV 90.0  --   --   --   --   --  88.2  PLT 274  --   --   --   --   --  182   < > = values in this interval not displayed.   Basic Metabolic Panel: Recent Labs  Lab 12/31/19 1012 01/01/20 1034 01/02/20 0425  NA 135 137 136  K 4.1 4.0 3.9  CL 98 98 100  CO2 20* 24 26  GLUCOSE 89 79 96  BUN 10 7* <5*  CREATININE 0.59* 0.70 0.68  CALCIUM 9.2 8.9 9.1  MG  --  1.8  --    GFR: Estimated Creatinine Clearance: 86.7 mL/min (by C-G formula based on SCr of  0.68 mg/dL). Liver Function Tests: Recent Labs  Lab 12/31/19 1012 01/01/20 1034 01/02/20 0425  AST 37 35 30  ALT 15 14 16   ALKPHOS 67 57 59  BILITOT 1.3* 2.5* 1.8*  PROT 7.7 5.9* 6.3*  ALBUMIN 4.6 3.6 3.6   Recent Labs  Lab 12/31/19 1012  LIPASE 24   No results for input(s): AMMONIA in the last 168 hours. Coagulation Profile: Recent Labs  Lab 12/31/19 1322  INR 1.0   Cardiac Enzymes: No results for input(s): CKTOTAL, CKMB, CKMBINDEX, TROPONINI in the last 168 hours. BNP (last 3 results) No results for input(s): PROBNP in the last 8760 hours. HbA1C: No results for input(s): HGBA1C in the last 72 hours. CBG: No results for input(s): GLUCAP in the last 168 hours. Lipid Profile: No results for input(s): CHOL, HDL, LDLCALC, TRIG, CHOLHDL, LDLDIRECT in the last 72 hours. Thyroid Function Tests: Recent Labs    12/31/19 1812  TSH 0.463  FREET4 1.09   Anemia Panel: No results for input(s): VITAMINB12, FOLATE, FERRITIN, TIBC, IRON, RETICCTPCT in the last 72 hours. Sepsis Labs: Recent Labs  Lab 12/31/19 1322 12/31/19 1548 12/31/19 1812 01/01/20 1034  LATICACIDVEN 4.3* 1.9 1.1 0.9    Recent Results (from the past 240 hour(s))  SARS Coronavirus 2 by RT PCR (hospital order, performed in [facility] hospital lab) Nasopharyngeal Nasopharyngeal Swab     Status: None   Collection Time: 12/31/19  1:21 PM   Specimen: Nasopharyngeal Swab  Result Value Ref Range Status   SARS Coronavirus 2 NEGATIVE NEGATIVE Final    Comment: (NOTE) SARS-CoV-2 target nucleic acids are NOT DETECTED.  The SARS-CoV-2 RNA is generally detectable in upper and lower respiratory specimens during the acute phase of infection. The lowest concentration of SARS-CoV-2 viral copies this assay can detect is 250 copies / mL. A negative result does not preclude SARS-CoV-2 infection and should not be used as the sole basis for treatment or other patient management decisions.  A negative result may occur  with improper specimen collection / handling, submission of specimen other than nasopharyngeal swab, presence of viral mutation(s) within the areas targeted by this assay, and inadequate number of viral copies (<250 copies / mL). A negative result must be combined with clinical observations, patient history, and epidemiological information.  Fact Sheet for Patients:   BoilerBrush.com.cy  Fact Sheet for Healthcare Providers: https://pope.com/  This test is not yet approved or  cleared by the [place] FDA and has been authorized for detection and/or diagnosis of SARS-CoV-2 by FDA under an Emergency Use Authorization (EUA).  This EUA will remain in effect (meaning this test can be used) for the duration of the COVID-19 declaration under Section 564(b)(1) of the Act, 21 U.S.C. section 360bbb-3(b)(1), unless the authorization is terminated or revoked sooner.  Performed at [facility], [address] [place], [place] [address]       Imaging Studies   US Abdomen Limited RUQ  Result Date: 01/01/2020 CLINICAL DATA:  Elevated bilirubin. EXAM: ULTRASOUND ABDOMEN LIMITED RIGHT UPPER QUADRANT COMPARISON:  CT dated 12/31/2019 FINDINGS: Gallbladder: Multiple gallstones are noted. There is no gallbladder wall thickening. There is no pericholecystic free fluid. There appears to be a gallstone wedged at the gallbladder neck. The sonographic Eulah Pont sign is reported as positive. Common bile duct: Diameter: 6 mm Liver: Diffuse increased echogenicity with slightly heterogeneous liver. Appearance typically secondary to fatty infiltration. Fibrosis secondary consideration. No secondary findings of cirrhosis noted. No focal hepatic lesion or intrahepatic biliary duct dilatation. Portal vein is patent on color Doppler imaging with normal direction of blood flow towards the liver. Other: None. IMPRESSION: Sonographic findings are equivocal for  calculus acute cholecystitis. Consider HIDA scan for further evaluation. Electronically Signed   By: Katherine Mantle M.D.   On: 01/01/2020 14:51      Medications   Scheduled Meds: . [MAR Hold] chlordiazePOXIDE  25 mg Oral TID  . [MAR Hold] escitalopram  20 mg Oral Daily  . [MAR Hold] folic acid  1 mg Oral Daily  . [MAR Hold] LORazepam  0-4 mg Intravenous Q12H   Or  . [MAR Hold] LORazepam  0-4 mg Oral Q12H  . [MAR Hold] multivitamin with minerals  1 tablet Oral Daily  . [MAR Hold] nicotine  21 mg Transdermal Daily  . [MAR Hold] ondansetron (ZOFRAN) IV  4 mg Intravenous Once  . [MAR Hold] pantoprazole  40 mg Oral Daily  . [MAR Hold] thiamine  100 mg Oral Daily   Or  . [MAR Hold] thiamine  100 mg Intravenous Daily   Continuous Infusions: . sodium chloride 100 mL/hr at 01/02/20 1446  . [MAR Hold] piperacillin-tazobactam (ZOSYN)  IV 3.375 g (01/02/20 1211)       LOS: 2 days    Time spent: 25 minutes with > 50% spent in coordination of care and direct patient contact.    Pennie Banter, DO [facility]  01/02/2020, 4:18 PM    If 7PM-7AM, please contact night-coverage. How to contact the [facility] Attending or Consulting provider 7A - 7P or covering provider during after hours 7P -7A, for this patient?    1. Check the care team in [facility] and look for a) attending/consulting [facility] provider listed and b) the [facility] team listed 2. Log into [email or web address] and use [place]'s universal password to access. If you do not have the password, please contact the hospital operator. 3. Locate the [facility] provider you are looking for under [facility] and page to a number that you can be directly reached. 4. If you still have difficulty reaching the provider, please page the [facility] (Director on Call) for the Hospitalists listed on amion for assistance.

## 2020-01-02 NOTE — Transfer of Care (Signed)
Immediate Anesthesia Transfer of Care Note  Patient: Chad [name]  Procedure(s) Performed: ESOPHAGOGASTRODUODENOSCOPY (EGD) (N/A )  Patient Location: PACU and Endoscopy Unit  Anesthesia Type:General  Level of Consciousness: drowsy  Airway & Oxygen Therapy: Patient Spontanous Breathing  Post-op Assessment: Report given to RN  Post vital signs: stable  Last Vitals:  Vitals Value Taken Time  BP 112/74 01/02/20 1034  Temp 36.2 C 01/02/20 1034  Pulse 87 01/02/20 1036  Resp 20 01/02/20 1036  SpO2 96 % 01/02/20 1036  Vitals shown include unvalidated device data.  Last Pain:  Vitals:   01/02/20 1034  TempSrc: Tympanic  PainSc: Asleep         Complications: No complications documented.

## 2020-01-02 NOTE — Op Note (Signed)
Laparoscopic Cholecystectomy  Pre-operative Diagnosis: acute calculous cholecystitis   Post-operative Diagnosis: same   Procedure: laparoscopic cholecystectomy   Surgeon: Duanne Guess, MD  Anesthesia: GETA  Assistant:none   Findings: Inflamed gallbladder, consistent with acute cholecystitis   Estimated Blood Loss: 10 mL         Drains: none         Specimens: Gallbladder           Complications: none   Procedure Details  The patient was seen again in the preoperative holding area. The benefits, complications, treatment options, and expected outcomes were discussed with the patient. The risks of bleeding, infection, recurrence of symptoms, failure to resolve symptoms, bile duct damage, bile duct leak, retained common bile duct stone, bowel injury, any of which could require further surgery and/or ERCP, stent, or papillotomy were reviewed with the patient. The likelihood of improving the patient's symptoms with return to their baseline status is good.  The patient and/or family concurred with the proposed plan, giving informed consent.  The patient was taken to operating room, identified as Chad [name] and the procedure verified as Laparoscopic Cholecystectomy. A time out was performed and the above information confirmed.  Prior to the induction of general anesthesia, antibiotic prophylaxis was administered (had just received floor abx). VTE prophylaxis was in place. General endotracheal anesthesia was then administered and tolerated well. After the induction, the abdomen was prepped with Chloraprep and draped in the sterile fashion. The patient was positioned in the supine position.  Optiview technique was used to enter the abdomen via a 52mm port in the right upper quadrant. Pneumoperitoneum was then created with CO2 and tolerated well without any adverse changes in the patient's vital signs.  A 10 mm infraumbilical port and 2 additional 5-mm right upper quadrant ports were  placed, all under direct vision. All skin incisions  were infiltrated with a local anesthetic agent before making the incision and placing the trocars.   The patient was positioned  in reverse Trendelenburg, tilted slightly to the patient's left.  The gallbladder was identified, the fundus grasped and retracted cephalad. Adhesions were lysed bluntly. The infundibulum was grasped and retracted laterally, exposing the peritoneum overlying the triangle of Calot. This was then divided and exposed in a blunt fashion. An extended critical view of the cystic duct and cystic artery was obtained.  The cystic duct was clearly identified and bluntly dissected free. Both the cystic artery and duct were double clipped and divided.  The gallbladder was taken from the gallbladder fossa in a retrograde fashion with the electrocautery. The gallbladder was removed and placed in an Endopouch bag. The liver bed was irrigated and inspected. Hemostasis was achieved with the electrocautery, as well as Vistaseal for raw surface oozing. Copious saline irrigation was utilized and was repeatedly aspirated until clear.  The gallbladder and Endopouch sac were then removed through a port site.   Inspection of the right upper quadrant was performed. No bleeding, bile duct injury or leak, or bowel injury was noted. Pneumoperitoneum was released.  The periumbilical port site was closed with interrumpted 0 Vicryl sutures. 4-0 subcuticular Monocryl was used to close the skin. Dermabond was applied, followed by Steri-Strips.  The patient was then extubated and brought to the recovery room in stable condition. Sponge, lap, and needle counts were correct at closure and at the conclusion of the case.               Duanne Guess, MD, FACS

## 2020-01-02 NOTE — Anesthesia Postprocedure Evaluation (Signed)
Anesthesia Post Note  Patient: Nicholson Starace  Procedure(s) Performed: ESOPHAGOGASTRODUODENOSCOPY (EGD) (N/A )  Patient location during evaluation: Endoscopy Anesthesia Type: General Level of consciousness: awake and alert Pain management: pain level controlled Vital Signs Assessment: post-procedure vital signs reviewed and stable Respiratory status: spontaneous breathing, nonlabored ventilation, respiratory function stable and patient connected to nasal cannula oxygen Cardiovascular status: blood pressure returned to baseline and stable Postop Assessment: no apparent nausea or vomiting Anesthetic complications: no   No complications documented.   Last Vitals:  Vitals:   01/02/20 1044 01/02/20 1054  BP: 125/90 (!) 133/92  Pulse: 89 85  Resp: 18 20  Temp:    SpO2: 96% 97%    Last Pain:  Vitals:   01/02/20 1104  TempSrc:   PainSc: 9                  Corinda Gubler

## 2020-01-02 NOTE — Progress Notes (Signed)
Patient back in bed from lap chole sx, 4 lap sites dressed with steri strips WDL. VSS.

## 2020-01-02 NOTE — Anesthesia Procedure Notes (Signed)
Procedure Name: Intubation Date/Time: 01/02/2020 3:00 PM Performed by: Jaye Beagle, CRNA Pre-anesthesia Checklist: Patient identified, Emergency Drugs available, Suction available and Patient being monitored Patient Re-evaluated:Patient Re-evaluated prior to induction Oxygen Delivery Method: Circle system utilized Preoxygenation: Pre-oxygenation with 100% oxygen Induction Type: IV induction, Rapid sequence and Cricoid Pressure applied Laryngoscope Size: McGraph and 4 Grade View: Grade I Tube type: Oral Tube size: 7.5 mm Number of attempts: 1 Airway Equipment and Method: Stylet and Oral airway Placement Confirmation: ETT inserted through vocal cords under direct vision,  positive ETCO2 and breath sounds checked- equal and bilateral Secured at: 22 cm Tube secured with: Tape Dental Injury: Teeth and Oropharynx as per pre-operative assessment
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
## 2020-01-02 NOTE — Progress Notes (Signed)
Pharmacy Antibiotic Note  Chad [name] is a 69 y.o. male admitted on 12/31/2019 with intra-abdominal infection. Patient has PMH significant for ETOH abuse, HTN, CKD, DVT and  presented to the ED with abdominal/epigastric and chest pain. Reported blood in vomit and stool. Patient did not follow up with GI or PCP from last hospitalization since he had no transportation. GI following. Pharmacy has been consulted for Zosyn dosing.   9/12 US abdomen: acute cholecystitis Day 2 IV abx, WBC wnl, lactic acid 4.3>1.9>0.9, Scr <1 (stable)  Plan: Continue Zosyn 3.375g IV q8h (4 hour infusion)   Height: 5\' 8"  (172.7 cm) Weight: 71.5 kg (157 lb 10.1 oz) IBW/kg (Calculated) : 68.4  Temp (24hrs), Avg:98.2 F (36.8 C), Min:97.6 F (36.4 C), Max:98.9 F (37.2 C)  Recent Labs  Lab 12/31/19 1012 12/31/19 1322 12/31/19 1548 12/31/19 1812 01/01/20 1034 01/02/20 0425  WBC 9.1  --   --   --   --  6.8  CREATININE 0.59*  --   --   --  0.70 0.68  LATICACIDVEN  --  4.3* 1.9 1.1 0.9  --     Estimated Creatinine Clearance: 86.7 mL/min (by C-G formula based on SCr of 0.68 mg/dL).    Allergies  Allergen Reactions  . Morphine And Related Other (See Comments)    Causes "bad disposition"  . Ibuprofen Other (See Comments)    Gi upset    Antimicrobials this admission: Zosyn 9/12 >>   Microbiology results:  Thank you for allowing pharmacy to be a part of this patient's care.  11/12, PharmD Pharmacy Resident  01/02/2020 9:21 AM

## 2020-01-02 NOTE — TOC Progression Note (Signed)
Transition of Care [facility]) - Progression Note    Patient Details  Name: Chad [name] MRN: [id number] Date of Birth: [birth year]  [facility]) CM/SW Contact  Shawn Route, RN Phone Number: 01/02/2020, 12:34 PM  Clinical Narrative:     Claib[facility] Rigg Abuse Resources provided to patient        Expected Discharge Plan and Services                                                 Social Determinants of Health (SDOH) Interventions    Readmission Risk Interventions No flowsheet data found.

## 2020-01-02 NOTE — Anesthesia Preprocedure Evaluation (Signed)
Anesthesia Evaluation  Patient identified by MRN, date of birth, ID band Patient awake  General Assessment Comment:Agitated, says he is in alcohol withdrawal  Reviewed: Allergy & Precautions, H&P , NPO status , Patient's Chart, lab work & pertinent test results  History of Anesthesia Complications Negative for: history of anesthetic complications  Airway Mallampati: II  TM Distance: >3 FB Neck ROM: full    Dental  (+) Poor Dentition, Missing   Pulmonary neg sleep apnea, neg COPD, Current Smoker and Patient abstained from smoking.,    Pulmonary exam normal breath sounds clear to auscultation       Cardiovascular Exercise Tolerance: Good METShypertension, On Medications (-) CAD and (-) Past MI Normal cardiovascular exam(-) dysrhythmias  Rhythm:regular Rate:Normal - Systolic murmurs    Neuro/Psych  Headaches, Seizures -,  PSYCHIATRIC DISORDERS Anxiety Depression Alcohol withdrawal seizures    GI/Hepatic GERD  ,(+)     substance abuse  alcohol use, Hx of alcohol withdrawal, says he has had seizures before from it Last drink 3 days ago Concern for variceal / UGI bleed   Endo/Other  negative endocrine ROSneg diabetes  Renal/GU CRFRenal disease     Musculoskeletal   Abdominal   Peds  Hematology negative hematology ROS (+)   Anesthesia Other Findings Past Medical History: No date: Anxiety     Comment:  takes lexapro No date: Chronic kidney disease     Comment:  patient states he was told he has 'moderate kidney               disease' No date: Degenerative disc disease, lumbar No date: Exposure to hepatitis C No date: GERD (gastroesophageal reflux disease)     Comment:  takes OTC as needed - omeprazole No date: Headache     Comment:  on occasion No date: History of gallstones No date: Hypertension     Comment:  diet controlled approx 15 years ago: Pneumonia  Reproductive/Obstetrics negative OB ROS                             Anesthesia Physical  Anesthesia Plan  ASA: III  Anesthesia Plan: General   Post-op Pain Management:    Induction: Intravenous  PONV Risk Score and Plan: 2 and Ondansetron, Propofol infusion and TIVA  Airway Management Planned: Natural Airway  Additional Equipment: None  Intra-op Plan:   Post-operative Plan:   Informed Consent: I have reviewed the patients History and Physical, chart, labs and discussed the procedure including the risks, benefits and alternatives for the proposed anesthesia with the patient or authorized representative who has indicated his/her understanding and acceptance.     Dental Advisory Given  Plan Discussed with: Anesthesiologist, CRNA and Surgeon  Anesthesia Plan Comments: ( Hematemesis and melena 3 days ago. No vomiting since then while admitted, no hematemesis. Hgb has been stable.   Discussed risks of anesthesia with patient, including possibility of difficulty with spontaneous ventilation under anesthesia necessitating airway intervention, aspiration, PONV, and rare risks such as cardiac or respiratory or neurological events. Patient understands.)       Anesthesia Quick Evaluation

## 2020-01-02 NOTE — Transfer of Care (Signed)
Immediate Anesthesia Transfer of Care Note  Patient: Chad [name]  Procedure(s) Performed: LAPAROSCOPIC CHOLECYSTECTOMY (N/A )  Patient Location: PACU  Anesthesia Type:General  Level of Consciousness: drowsy  Airway & Oxygen Therapy: Patient Spontanous Breathing and Patient connected to face mask oxygen  Post-op Assessment: Report given to RN  Post vital signs: stable  Last Vitals:  Vitals Value Taken Time  BP 157/87 01/02/20 1651  Temp    Pulse 68 01/02/20 1654  Resp 25 01/02/20 1654  SpO2 99 % 01/02/20 1654  Vitals shown include unvalidated device data.  Last Pain:  Vitals:   01/02/20 1316  TempSrc: Temporal  PainSc: 9       Patients Stated Pain Goal: 2 (01/02/20 0835)  Complications: No complications documented.

## 2020-01-02 NOTE — Op Note (Signed)
[facility] Patient Name: Chad [name] Procedure Date: 01/02/2020 10:17 AM MRN: [id number] Account #: [id number] Date of Birth: Jun 01, 1950 Admit Type: Inpatient Age: 69 Room: [facility] ENDO ROOM 1 Gender: Male Note Status: Finalized Procedure:             Upper GI endoscopy Indications:           Hematemesis Providers:             Wyline Mood MD, MD Referring MD:          No Local Md, MD (Referring MD) Medicines:             Monitored Anesthesia Care Complications:         No immediate complications. Procedure:             Pre-Anesthesia Assessment:                        - Prior to the procedure, a History and Physical was                         performed, and patient medications, allergies and                         sensitivities were reviewed. The patient's tolerance                         of previous anesthesia was reviewed.                        - The risks and benefits of the procedure and the                         sedation options and risks were discussed with the                         patient. All questions were answered and informed                         consent was obtained.                        - ASA Grade Assessment: III - A patient with severe                         systemic disease.                        After obtaining informed consent, the endoscope was                         passed under direct vision. Throughout the procedure,                         the patient's blood pressure, pulse, and oxygen                         saturations were monitored continuously. The Endoscope                         was introduced through the mouth, and advanced  to the                         third part of duodenum. The upper GI endoscopy was                         accomplished with ease. The patient tolerated the                         procedure well. Findings:      The examined duodenum was normal.      A small amount of food  (residue) was found in the gastric body.      There were esophageal mucosal changes suspicious for short-segment       Barrett's esophagus present in the lower third of the esophagus. The       maximum longitudinal extent of these mucosal changes was 2 cm in length.       Biopsy is contraindicated because he has been admitted with hematemesis       /GI bleed      The cardia and gastric fundus were normal on retroflexion. Impression:            - Normal examined duodenum.                        - A small amount of food (residue) in the stomach.                        - Esophageal mucosal changes suspicious for                         short-segment Barrett's esophagus. Biopsy is                         contraindicated.                        - No specimens collected. Recommendation:        - Return patient to hospital ward for ongoing care.                        - Advance diet as tolerated.                        - Continue present medications.                        - Repeat upper endoscopy in 6 weeks for surveillance                         of Barrett's esophagus.                        - Continue long term prilosec 40 mg a day Procedure Code(s):     --- Professional ---                        [phone], Esophagogastroduodenoscopy, flexible,                         transoral; diagnostic, including collection of  specimen(s) by brushing or washing, when performed                         (separate procedure) Diagnosis Code(s):     --- Professional ---                        K22.8, Other specified diseases of esophagus                        K92.0, Hematemesis CPT copyright 2019 [employer] Medical Association. All rights reserved. The codes documented in this report are preliminary and upon coder review may  be revised to meet current compliance requirements. Wyline Mood, MD Wyline Mood MD, MD 01/02/2020 10:33:13 AM This report has been signed electronically. Number of  Addenda: 0 Note Initiated On: 01/02/2020 10:17 AM Estimated Blood Loss:  Estimated blood loss: none.      [facility], [place]
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
## 2020-01-03 ENCOUNTER — Inpatient Hospital Stay: Payer: Medicare HMO

## 2020-01-03 ENCOUNTER — Encounter: Payer: Self-pay | Admitting: General Surgery

## 2020-01-03 ENCOUNTER — Telehealth: Payer: Self-pay

## 2020-01-03 LAB — CBC
HCT: 35.9 % — ABNORMAL LOW (ref 39.0–52.0)
Hemoglobin: 12.4 g/dL — ABNORMAL LOW (ref 13.0–17.0)
MCH: 31.2 pg (ref 26.0–34.0)
MCHC: 34.5 g/dL (ref 30.0–36.0)
MCV: 90.2 fL (ref 80.0–100.0)
Platelets: 159 10*3/uL (ref 150–400)
RBC: 3.98 MIL/uL — ABNORMAL LOW (ref 4.22–5.81)
RDW: 15.8 % — ABNORMAL HIGH (ref 11.5–15.5)
WBC: 6.8 10*3/uL (ref 4.0–10.5)
nRBC: 0 % (ref 0.0–0.2)

## 2020-01-03 LAB — COMPREHENSIVE METABOLIC PANEL
ALT: 16 U/L (ref 0–44)
AST: 32 U/L (ref 15–41)
Albumin: 3.2 g/dL — ABNORMAL LOW (ref 3.5–5.0)
Alkaline Phosphatase: 54 U/L (ref 38–126)
Anion gap: 8 (ref 5–15)
BUN: <5 mg/dL — ABNORMAL LOW (ref 8–23)
CO2: 26 mmol/L (ref 22–32)
Calcium: 8.4 mg/dL — ABNORMAL LOW (ref 8.9–10.3)
Chloride: 101 mmol/L (ref 98–111)
Creatinine, Ser: 0.61 mg/dL (ref 0.61–1.24)
GFR calc Af Amer: >60 mL/min (ref >60)
GFR calc non Af Amer: >60 mL/min (ref >60)
Glucose, Bld: 121 mg/dL — ABNORMAL HIGH (ref 70–99)
Potassium: 3.7 mmol/L (ref 3.5–5.1)
Sodium: 135 mmol/L (ref 135–145)
Total Bilirubin: 1 mg/dL (ref 0.3–1.2)
Total Protein: 5.5 g/dL — ABNORMAL LOW (ref 6.5–8.1)

## 2020-01-03 LAB — MAGNESIUM: Magnesium: 1.6 mg/dL — ABNORMAL LOW (ref 1.7–2.4)

## 2020-01-03 MED ORDER — MAGNESIUM SULFATE 2 GM/50ML IV SOLN
2.0000 g | Freq: Once | INTRAVENOUS | Status: AC
  Administered 2020-01-03: 2 g via INTRAVENOUS
  Filled 2020-01-03: qty 50

## 2020-01-03 MED ORDER — FENTANYL CITRATE (PF) 100 MCG/2ML IJ SOLN
12.5000 ug | INTRAMUSCULAR | Status: DC | PRN
  Administered 2020-01-03 (×2): 12.5 ug via INTRAVENOUS
  Filled 2020-01-03 (×2): qty 2

## 2020-01-03 MED ORDER — LORAZEPAM 1 MG PO TABS: 1.0000 mg | ORAL_TABLET | ORAL | Status: DC | PRN

## 2020-01-03 MED ORDER — AMLODIPINE BESYLATE 5 MG PO TABS
5.0000 mg | ORAL_TABLET | Freq: Every day | ORAL | Status: DC
  Administered 2020-01-04 – 2020-01-06 (×3): 5 mg via ORAL
  Filled 2020-01-03 (×3): qty 1

## 2020-01-03 MED ORDER — LORAZEPAM 2 MG/ML IJ SOLN
1.0000 mg | INTRAMUSCULAR | Status: DC | PRN
  Administered 2020-01-03: 2 mg via INTRAVENOUS
  Filled 2020-01-03: qty 1

## 2020-01-03 MED ORDER — ACETAMINOPHEN 325 MG PO TABS
650.0000 mg | ORAL_TABLET | Freq: Four times a day (QID) | ORAL | Status: DC | PRN
  Administered 2020-01-03: 650 mg via ORAL
  Filled 2020-01-03: qty 2

## 2020-01-03 NOTE — Progress Notes (Signed)
Mobility Specialist - Progress Note   01/03/20 1544  Mobility  Activity Refused mobility  Mobility performed by Mobility specialist    Pt refused mobility this date d/t complaints of pain in abdomen. Will attempt session at a later date/time.    Chad [name] Mobility Specialist 01/03/20, 3:45 PM

## 2020-01-03 NOTE — Plan of Care (Signed)
  Problem: Clinical Measurements: Goal: Will remain free from infection Outcome: Progressing   Problem: Pain Managment: Goal: General experience of comfort will improve Outcome: Progressing   Problem: Skin Integrity: Goal: Risk for impaired skin integrity will decrease Outcome: Progressing   

## 2020-01-03 NOTE — Anesthesia Postprocedure Evaluation (Signed)
Anesthesia Post Note  Patient: Chad [name]  Procedure(s) Performed: LAPAROSCOPIC CHOLECYSTECTOMY (N/A )  Patient location during evaluation: PACU Anesthesia Type: General Level of consciousness: awake and alert and oriented Pain management: pain level controlled Vital Signs Assessment: post-procedure vital signs reviewed and stable Respiratory status: spontaneous breathing Cardiovascular status: blood pressure returned to baseline Anesthetic complications: no   No complications documented.   Last Vitals:  Vitals:   01/03/20 0443 01/03/20 0801  BP: (!) 157/90 (!) 145/70  Pulse: 70 67  Resp: 20 18  Temp: 36.7 C 36.4 C  SpO2: 96% 95%    Last Pain:  Vitals:   01/03/20 0801  TempSrc: Oral  PainSc:                  Akisha Sturgill
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
## 2020-01-03 NOTE — Telephone Encounter (Signed)
Called patient to reschedule a repeat upper endoscopy per Dr. Tobi Bastos. LVM

## 2020-01-03 NOTE — Progress Notes (Signed)
PROGRESS NOTE    Chad [name]   YOV:[id number]  DOB: December 10, 1950  PCP: Patient, No Pcp Per    DOA: 12/31/2019 LOS: 3   Brief Narrative   Chad [name] is a 69 y.o. 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  He presented to the ED on 12/31/19 with abdominal/epigastric and chest pain since the day before.  He reported blood in vomitus and stool. Since yesterday had hematemesis twice and BRBPR x 3.  He presented similarly in August, was seen by GI, Dr. Maximino Greenland, outpatient follow up was recommended.  He had not yet followed up due to lack of transportation. Reports using [employer] or [employer] powers frequently for headaches recently.    Evaluation in the ED was essentially unremarkable other than EtOH level 115, lactic acid initially 4.3, RDW elevated 16.4.  Admitted to hospitalist service with GI consulted for further evaluation and management.      Assessment & Plan   Principal Problem:   GI bleed Active Problems:   HTN (hypertension)   Alcohol dependence with uncomplicated withdrawal (HCC)   Anxiety and depression   Chest pain   Tobacco abuse   Epigastric pain   Calculus of gallbladder with acute cholecystitis without obstruction    GI bleeding - Resolved.  Presented with history of hematemesis and BRBPR.  No episodes since admission. EGD 9/13 - no source of bleeding found, esophageal mucosa suspicious for Barrett's esophagus, biopsy contraindicated --GI consulted --Continue IV PPI BID --2 large bore IV's --transfuse if Hbg < 8 --trend H&H's --no NSAID's --repeat EGD in 6 weeks --page GI if acute changes in hemodynamic status or signs of active bleeding   Acute cholecystitis - RUQ ultrasound obtained due to rise in Tbili, it showed gallstone in gallbladder neck, positive Murphy sign. General surgery consulted.   Lap chole on 9/13 with Dr. Lady Gary. --d/c antibiotics --Analgesics and antiemetics  PRN --Advance diet --cleared for d/c from surgical standpoint once pain controlled and tolerating diet   Alcohol dependence with uncomplicated withdrawal  - Counseled on importance of cessation for overall health and wellbeing.  Will have TOC provide resource information.  Patient denies hx of seizures, hallucinations or severe DT's during withdrawal (not that he recalls).  EtOH 115 on presentation.  Low CIWA's past 24 hours. --CIWA protocol with PRN Ativan --Monitor closely --Librium 25 mg PO TID, reduce to BID tomorrow, daily on Thursday then stop --Continue thiamine and folate   Epigastric and Chest pain - suspect PUD / gastritis / esophagitis given heavy liquor consumption daily.   Also with RUQ pain and guarding.  RUQ ultrasound showed gallstone in neck of the gallbladder. --Surgery consulted, s/p lap chole as above --antiemetics and analgesics PRN  Hypertension - continue home amlodipine  Anxiety and depression - continue home trazodone and Lexapro, hold Vistaril while getting benzo's  Tobacco abuse - nicotine patch.  Counseled on cessation.  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  Monitor BMP.   Patient BMI: Body mass index is 23.87 kg/m.   DVT prophylaxis: SCDs Start: 12/31/19 1628   Diet:  Diet Orders (From admission, onward)    Start     Ordered   01/03/20 0808  Diet Heart Room service appropriate? Yes; Fluid consistency: Thin  Diet effective now       Question Answer Comment  Room service appropriate? Yes   Fluid consistency: Thin      01/03/20  0102            Code Status: Full Code    Subjective 01/03/20    Patient seen at bedside this AM after breakfast.  He says he tolerated a little of his food, but had increased nausea, no vomiting.  Tremors much better.  Does not feel like getting up out of the bed today.  Encouraged up in chair and mobilizing to facilitate recovery.   Disposition Plan & Communication   Status is: Inpatient  Remains  inpatient appropriate because of severity of illness, inadequate PO intake, pain uncontrolled.  Patient needs to ambulate.   Dispo: The patient is from: Home              Anticipated d/c is to: Home              Anticipated d/c date is: 1 day              Patient currently is not medically stable to d/c.    Family Communication: none at bedside, will attempt to call    Consults, Procedures, Significant Events   Consultants:   Gastroenterology  General Surgery  Procedures:   None  Antimicrobials:   None   Objective   Vitals:   01/03/20 0451 01/03/20 0801 01/03/20 1206 01/03/20 1616  BP:  (!) 145/70 (!) 112/56 118/77  Pulse:  67 65 86  Resp:  18 17 17   Temp:  97.6 F (36.4 C) 97.9 F (36.6 C) 97.8 F (36.6 C)  TempSrc:  Oral Oral Oral  SpO2:  95% 94% 98%  Weight: 71.2 kg     Height:        Intake/Output Summary (Last 24 hours) at 01/03/2020 1738 Last data filed at 01/03/2020 0801 Gross per 24 hour  Intake 200 ml  Output 1550 ml  Net -1350 ml   Filed Weights   12/31/19 0853 01/01/20 0629 01/03/20 0451  Weight: 72.6 kg 71.5 kg 71.2 kg    Physical Exam:  General exam: awake, alert, no acute distress, chronically ill appearing Respiratory system: CTAB, diminished, no wheezes or rhonchi, normal respiratory effort. Cardiovascular system: normal S1/S2, RRR, no pedal edema.   Gastrointestinal system: tenderness appropriate for POD1, no rebound tenderness, soft Central nervous system: A&O x4. no gross focal neurologic deficits, normal speech Extremities: minimal tremor of outstretched hands.  No edema.   Labs   Data Reviewed: I have personally reviewed following labs and imaging studies  CBC: Recent Labs  Lab 12/31/19 1012 12/31/19 1812 01/01/20 1034 01/01/20 1627 01/01/20 2221 01/02/20 0425 01/03/20 0514  WBC 9.1  --   --   --   --  6.8 6.8  HGB 15.2   < > 13.0 12.5* 12.2* 13.4 12.4*  HCT 44.2   < > 38.5* 37.5* 35.6* 38.3* 35.9*  MCV 90.0  --    --   --   --  88.2 90.2  PLT 274  --   --   --   --  182 159   < > = values in this interval not displayed.   Basic Metabolic Panel: Recent Labs  Lab 12/31/19 1012 01/01/20 1034 01/02/20 0425 01/03/20 0514  NA 135 137 136 135  K 4.1 4.0 3.9 3.7  CL 98 98 100 101  CO2 20* 24 26 26   GLUCOSE 89 79 96 121*  BUN 10 7* <5* <5*  CREATININE 0.59* 0.70 0.68 0.61  CALCIUM 9.2 8.9 9.1 8.4*  MG  --  1.8  --  1.6*   GFR: Estimated Creatinine Clearance: 86.7 mL/min (by C-G formula based on SCr of 0.61 mg/dL). Liver Function Tests: Recent Labs  Lab 12/31/19 1012 01/01/20 1034 01/02/20 0425 01/03/20 0514  AST 37 35 30 32  ALT 15 14 16 16   ALKPHOS 67 57 59 54  BILITOT 1.3* 2.5* 1.8* 1.0  PROT 7.7 5.9* 6.3* 5.5*  ALBUMIN 4.6 3.6 3.6 3.2*   Recent Labs  Lab 12/31/19 1012  LIPASE 24   No results for input(s): AMMONIA in the last 168 hours. Coagulation Profile: Recent Labs  Lab 12/31/19 1322  INR 1.0   Cardiac Enzymes: No results for input(s): CKTOTAL, CKMB, CKMBINDEX, TROPONINI in the last 168 hours. BNP (last 3 results) No results for input(s): PROBNP in the last 8760 hours. HbA1C: No results for input(s): HGBA1C in the last 72 hours. CBG: No results for input(s): GLUCAP in the last 168 hours. Lipid Profile: No results for input(s): CHOL, HDL, LDLCALC, TRIG, CHOLHDL, LDLDIRECT in the last 72 hours. Thyroid Function Tests: Recent Labs    12/31/19 1812  TSH 0.463  FREET4 1.09   Anemia Panel: No results for input(s): VITAMINB12, FOLATE, FERRITIN, TIBC, IRON, RETICCTPCT in the last 72 hours. Sepsis Labs: Recent Labs  Lab 12/31/19 1322 12/31/19 1548 12/31/19 1812 01/01/20 1034  LATICACIDVEN 4.3* 1.9 1.1 0.9    Recent Results (from the past 240 hour(s))  SARS Coronavirus 2 by RT PCR (hospital order, performed in [facility] hospital lab) Nasopharyngeal Nasopharyngeal Swab     Status: None   Collection Time: 12/31/19  1:21 PM   Specimen: Nasopharyngeal Swab    Result Value Ref Range Status   SARS Coronavirus 2 NEGATIVE NEGATIVE Final    Comment: (NOTE) SARS-CoV-2 target nucleic acids are NOT DETECTED.  The SARS-CoV-2 RNA is generally detectable in upper and lower respiratory specimens during the acute phase of infection. The lowest concentration of SARS-CoV-2 viral copies this assay can detect is 250 copies / mL. A negative result does not preclude SARS-CoV-2 infection and should not be used as the sole basis for treatment or other patient management decisions.  A negative result may occur with improper specimen collection / handling, submission of specimen other than nasopharyngeal swab, presence of viral mutation(s) within the areas targeted by this assay, and inadequate number of viral copies (<250 copies / mL). A negative result must be combined with clinical observations, patient history, and epidemiological information.  Fact Sheet for Patients:   03/01/20  Fact Sheet for Healthcare Providers: BoilerBrush.com.cy  This test is not yet approved or  cleared by the https://pope.com/ FDA and has been authorized for detection and/or diagnosis of SARS-CoV-2 by FDA under an Emergency Use Authorization (EUA).  This EUA will remain in effect (meaning this test can be used) for the duration of the COVID-19 declaration under Section 564(b)(1) of the Act, 21 U.S.C. section 360bbb-3(b)(1), unless the authorization is terminated or revoked sooner.  Performed at [facility], [address] [place], [place]       Imaging Studies   No results found.    Medications   Scheduled Meds: . [START ON 01/04/2020] amLODipine  5 mg Oral Daily  . chlordiazePOXIDE  25 mg Oral TID  . escitalopram  20 mg Oral Daily  . folic acid  1 mg Oral Daily  . LORazepam  0-4 mg Intravenous Q12H   Or  . LORazepam  0-4 mg Oral Q12H  . multivitamin with minerals  1 tablet Oral Daily  .  nicotine  21 mg Transdermal Daily  . ondansetron (ZOFRAN) IV  4 mg Intravenous Once  . pantoprazole  40 mg Oral Daily  . thiamine  100 mg Oral Daily   Or  . thiamine  100 mg Intravenous Daily   Continuous Infusions:      LOS: 3 days    Time spent: 25 minutes with > 50% spent in coordination of care and direct patient contact.    Pennie BanterKelly A Ethanael Veith, DO [facility]  01/03/2020, 5:38 PM    If 7PM-7AM, please contact night-coverage. How to contact the [facility] Attending or Consulting provider 7A - 7P or covering provider during after hours 7P -7A, for this patient?    1. Check the care team in [facility] and look for a) attending/consulting [facility] provider listed and b) the [facility] team listed 2. Log into [email or web address] and use [facility] universal password to access. If you do not have the password, please contact the hospital operator. 3. Locate the [facility] provider you are looking for under [facility] and page to a number that you can be directly reached. 4. If you still have difficulty reaching the provider, please page the [facility] (Director on Call) for the Hospitalists listed on amion for assistance.

## 2020-01-04 ENCOUNTER — Encounter: Payer: Self-pay | Admitting: Internal Medicine

## 2020-01-04 LAB — COMPREHENSIVE METABOLIC PANEL
ALT: 19 U/L (ref 0–44)
AST: 32 U/L (ref 15–41)
Albumin: 3.1 g/dL — ABNORMAL LOW (ref 3.5–5.0)
Alkaline Phosphatase: 73 U/L (ref 38–126)
Anion gap: 13 (ref 5–15)
BUN: <5 mg/dL — ABNORMAL LOW (ref 8–23)
CO2: 25 mmol/L (ref 22–32)
Calcium: 8.3 mg/dL — ABNORMAL LOW (ref 8.9–10.3)
Chloride: 97 mmol/L — ABNORMAL LOW (ref 98–111)
Creatinine, Ser: 0.68 mg/dL (ref 0.61–1.24)
GFR calc Af Amer: >60 mL/min (ref >60)
GFR calc non Af Amer: >60 mL/min (ref >60)
Glucose, Bld: 97 mg/dL (ref 70–99)
Potassium: 3.5 mmol/L (ref 3.5–5.1)
Sodium: 135 mmol/L (ref 135–145)
Total Bilirubin: 1.2 mg/dL (ref 0.3–1.2)
Total Protein: 5.7 g/dL — ABNORMAL LOW (ref 6.5–8.1)

## 2020-01-04 LAB — CBC
HCT: 38.8 % — ABNORMAL LOW (ref 39.0–52.0)
Hemoglobin: 12.8 g/dL — ABNORMAL LOW (ref 13.0–17.0)
MCH: 30.7 pg (ref 26.0–34.0)
MCHC: 33 g/dL (ref 30.0–36.0)
MCV: 93 fL (ref 80.0–100.0)
Platelets: 142 10*3/uL — ABNORMAL LOW (ref 150–400)
RBC: 4.17 MIL/uL — ABNORMAL LOW (ref 4.22–5.81)
RDW: 15.8 % — ABNORMAL HIGH (ref 11.5–15.5)
WBC: 6 10*3/uL (ref 4.0–10.5)
nRBC: 0 % (ref 0.0–0.2)

## 2020-01-04 LAB — MAGNESIUM: Magnesium: 1.7 mg/dL (ref 1.7–2.4)

## 2020-01-04 LAB — SURGICAL PATHOLOGY

## 2020-01-04 MED ORDER — CHLORDIAZEPOXIDE HCL 25 MG PO CAPS
25.0000 mg | ORAL_CAPSULE | Freq: Two times a day (BID) | ORAL | Status: DC
  Administered 2020-01-04 – 2020-01-06 (×5): 25 mg via ORAL
  Filled 2020-01-04 (×5): qty 1
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
## 2020-01-04 NOTE — Progress Notes (Signed)
Physical Therapy Evaluation Patient Details Name: Chad [name] MRN: [id number] DOB: Jun 15, 1950 Today's Date: 01/04/2020   History of Present Illness  Per MD note:Chad [name] is here for an endoscopy  to be performed for  evaluation of hematemsis  Clinical Impression  Patient presents with decreased gait speed, decreased balance, and decreased BLE strength. Patient's main complaint is BLE weakness and inability to participate in desired activities. Patient wants to improve his balance and ability to ambulate on  surfaces safely. Patient will benefit from skilled PT in order to increase gait speed, increase BLE strength, and improve dynamic standing balance to decrease risk for falls and enable patient to participate in desired activities.    Follow Up Recommendations SNF    Equipment Recommendations  Rolling walker with 5" wheels    Recommendations for Other Services       Precautions / Restrictions Precautions Precautions: None Restrictions Weight Bearing Restrictions: No      Mobility  Bed Mobility Overal bed mobility: Needs Assistance Bed Mobility: Supine to Sit;Sit to Supine     Supine to sit: Min guard Sit to supine: Min guard      Transfers Overall transfer level: Modified independent Equipment used: Rolling walker (2 wheeled)             General transfer comment: cues for hand placeament  Ambulation/Gait Ambulation/Gait assistance: Editor, commissioning (Feet): 25 Feet Assistive device: Rolling walker (2 wheeled) Gait Pattern/deviations: Step-to pattern        Stairs            Wheelchair Mobility    Modified Rankin (Stroke Patients Only)       Balance Overall balance assessment: Modified Independent                                           Pertinent Vitals/Pain Pain Assessment: Faces Faces Pain Scale: Hurts little more Pain Location:  (abdominal)    Home Living Family/patient expects to be discharged  to:: Private residence Living Arrangements: Alone     Home Access: Stairs to enter Entrance Stairs-Rails: Right Entrance Stairs-Number of Steps: 13          Prior Function Level of Independence: Independent               Hand Dominance        Extremity/Trunk Assessment   Upper Extremity Assessment Upper Extremity Assessment: Defer to OT evaluation    Lower Extremity Assessment Lower Extremity Assessment: Generalized weakness       Communication   Communication: No difficulties (slow to respond)  Cognition Arousal/Alertness: Awake/alert Behavior During Therapy: WFL for tasks assessed/performed Overall Cognitive Status: Within Functional Limits for tasks assessed                                        General Comments      Exercises     Assessment/Plan    PT Assessment Patient needs continued PT services  PT Problem List Decreased strength;Decreased balance;Decreased mobility       PT Treatment Interventions      PT Goals (Current goals can be found in the Care Plan section)  Acute Rehab PT Goals Patient Stated Goal: to walk better PT Goal Formulation: With patient Time For Goal Achievement: 01/18/20 Potential to Achieve  Goals: Good    Frequency Min 2X/week   Barriers to discharge        Co-evaluation               AM-PAC PT "6 Clicks" Mobility  Outcome Measure Help needed turning from your back to your side while in a flat bed without using bedrails?: A Little Help needed moving from lying on your back to sitting on the side of a flat bed without using bedrails?: A Little Help needed moving to and from a bed to a chair (including a wheelchair)?: A Little Help needed standing up from a chair using your arms (e.g., wheelchair or bedside chair)?: A Little Help needed to walk in hospital room?: A Lot Help needed climbing 3-5 steps with a railing? : Total 6 Click Score: 15    End of Session Equipment Utilized During  Treatment: Gait belt Activity Tolerance: Patient limited by fatigue;Patient limited by lethargy Patient left: in bed;with bed alarm set Nurse Communication: Mobility status PT Visit Diagnosis: Unsteadiness on feet (R26.81);Muscle weakness (generalized) (M62.81);Difficulty in walking, not elsewhere classified (R26.2)    Time: 5997-7414 PT Time Calculation (min) (ACUTE ONLY): 25 min   Charges:   PT Evaluation $PT Eval Low Complexity: 1 Low PT Treatments $Gait Training: 8-22 mins          Ezekiel Ina, PT DPT 01/04/2020, 12:45 PM

## 2020-01-04 NOTE — Progress Notes (Signed)
Progress Note    Chad [name]  DQQ:[id number] DOB: 1951-01-08  DOA: 12/31/2019 PCP: Patient, No Pcp Per      Brief Narrative:    Medical records reviewed and are as summarized below:  Chad [name] is a 69 y.o. 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  Diet Order            Diet Heart Room service appropriate? Yes; Fluid consistency: Thin  Diet effective now                    Consultants:  Gastroenterologist  General surgeon  Procedures:  EGD  Lap cholecystectomy    Medications:   . amLODipine  5 mg Oral Daily  . chlordiazePOXIDE  25 mg Oral BID  . escitalopram  20 mg Oral Daily  . folic acid  1 mg Oral Daily  . LORazepam  0-4 mg Intravenous Q12H   Or  . LORazepam  0-4 mg Oral Q12H  . multivitamin with minerals  1 tablet Oral Daily  . nicotine  21 mg Transdermal Daily  . ondansetron (ZOFRAN) IV  4 mg Intravenous Once  . pantoprazole  40 mg Oral Daily  . thiamine  100 mg Oral Daily   Or  . thiamine  100 mg Intravenous Daily   Continuous Infusions:   Anti-infectives (From admission, onward)   Start     Dose/Rate Route Frequency Ordered Stop   01/01/20 1800  piperacillin-tazobactam (ZOSYN) IVPB 3.375 g  Status:  Discontinued        3.375 g 12.5 mL/hr over 240 Minutes Intravenous Every 8 hours 01/01/20 1715  01/02/20 1947             Family Communication/Anticipated D/C date and plan/Code Status   DVT prophylaxis: SCDs Start: 12/31/19 1628     Code Status: Full Code  Family Communication: Plan discussed with patient Disposition Plan: Possible discharge to SNF in 1 to 2 days   Status is: Inpatient  Remains inpatient appropriate because:Unsafe d/c plan   Dispo:  Patient From: Home  Planned Disposition: Home  Expected discharge date: 01/04/20  Medically stable for discharge: No            Subjective:   C/o severe abdominal pain and generalized weakness.  He said he has not been able to ambulate because of generalized weakness and requested discharge to rehab.  Objective:    Vitals:   01/03/20 2201 01/03/20 2237 01/04/20 0001 01/04/20 0403  BP: 140/78 104/72 125/80 129/74  Pulse: 100 (!) 101 94 84  Resp:  18 20 20   Temp:  98.5 F (36.9 C) 97.7 F (36.5 C) 98 F (36.7 C)  TempSrc:  Oral Oral Oral  SpO2:  93% 95% 97%  Weight:  71 kg    Height:  5\' 9"  (1.753 m)     No data found.   Intake/Output Summary (Last  24 hours) at 01/04/2020 1314 Last data filed at 01/04/2020 0541 Gross per 24 hour  Intake 0 ml  Output 250 ml  Net -250 ml   Filed Weights   01/01/20 0629 01/03/20 0451 01/03/20 2237  Weight: 71.5 kg 71.2 kg 71 kg    Exam:  GEN: NAD SKIN: No rash EYES: EOMI ENT: MMM CV: RRR PULM: CTA B ABD: soft, ND, RUQ tenderness, +surgical tenderness, +BS CNS: AAO x 3, non focal EXT: No edema or tenderness   Data Reviewed:   I have personally reviewed following labs and imaging studies:  Labs: Labs show the following:   Basic Metabolic Panel: Recent Labs  Lab 12/31/19 1012 12/31/19 1012 01/01/20 1034 01/01/20 1034 01/02/20 0425 01/02/20 0425 01/03/20 0514 01/04/20 0456  NA 135  --  137  --  136  --  135 135  K 4.1   < > 4.0   < > 3.9   < > 3.7 3.5  CL 98  --  98  --  100  --  101 97*  CO2 20*  --  24  --  26  --  26 25    GLUCOSE 89  --  79  --  96  --  121* 97  BUN 10  --  7*  --  <5*  --  <5* <5*  CREATININE 0.59*  --  0.70  --  0.68  --  0.61 0.68  CALCIUM 9.2  --  8.9  --  9.1  --  8.4* 8.3*  MG  --   --  1.8  --   --   --  1.6* 1.7   < > = values in this interval not displayed.   GFR Estimated Creatinine Clearance: 89.6 mL/min (by C-G formula based on SCr of 0.68 mg/dL). Liver Function Tests: Recent Labs  Lab 12/31/19 1012 01/01/20 1034 01/02/20 0425 01/03/20 0514 01/04/20 0456  AST 37 35 30 32 32  ALT 15 14 16 16 19   ALKPHOS 67 57 59 54 73  BILITOT 1.3* 2.5* 1.8* 1.0 1.2  PROT 7.7 5.9* 6.3* 5.5* 5.7*  ALBUMIN 4.6 3.6 3.6 3.2* 3.1*   Recent Labs  Lab 12/31/19 1012  LIPASE 24   No results for input(s): AMMONIA in the last 168 hours. Coagulation profile Recent Labs  Lab 12/31/19 1322  INR 1.0    CBC: Recent Labs  Lab 12/31/19 1012 12/31/19 1812 01/01/20 1627 01/01/20 2221 01/02/20 0425 01/03/20 0514 01/04/20 0456  WBC 9.1  --   --   --  6.8 6.8 6.0  HGB 15.2   < > 12.5* 12.2* 13.4 12.4* 12.8*  HCT 44.2   < > 37.5* 35.6* 38.3* 35.9* 38.8*  MCV 90.0  --   --   --  88.2 90.2 93.0  PLT 274  --   --   --  182 159 142*   < > = values in this interval not displayed.   Cardiac Enzymes: No results for input(s): CKTOTAL, CKMB, CKMBINDEX, TROPONINI in the last 168 hours. BNP (last 3 results) No results for input(s): PROBNP in the last 8760 hours. CBG: No results for input(s): GLUCAP in the last 168 hours. D-Dimer: No results for input(s): DDIMER in the last 72 hours. Hgb A1c: No results for input(s): HGBA1C in the last 72 hours. Lipid Profile: No results for input(s): CHOL, HDL, LDLCALC, TRIG, CHOLHDL, LDLDIRECT in the last 72 hours. Thyroid function studies: No results for input(s): TSH, T4TOTAL, T3FREE, THYROIDAB  in the last 72 hours.  Invalid input(s): FREET3 Anemia work up: No results for input(s): VITAMINB12, FOLATE, FERRITIN, TIBC, IRON, RETICCTPCT in the last 72  hours. Sepsis Labs: Recent Labs  Lab 12/31/19 1012 12/31/19 1322 12/31/19 1548 12/31/19 1812 01/01/20 1034 01/02/20 0425 01/03/20 0514 01/04/20 0456  WBC 9.1  --   --   --   --  6.8 6.8 6.0  LATICACIDVEN  --  4.3* 1.9 1.1 0.9  --   --   --     Microbiology Recent Results (from the past 240 hour(s))  SARS Coronavirus 2 by RT PCR (hospital order, performed in [facility] hospital lab) Nasopharyngeal Nasopharyngeal Swab     Status: None   Collection Time: 12/31/19  1:21 PM   Specimen: Nasopharyngeal Swab  Result Value Ref Range Status   SARS Coronavirus 2 NEGATIVE NEGATIVE Final    Comment: (NOTE) SARS-CoV-2 target nucleic acids are NOT DETECTED.  The SARS-CoV-2 RNA is generally detectable in upper and lower respiratory specimens during the acute phase of infection. The lowest concentration of SARS-CoV-2 viral copies this assay can detect is 250 copies / mL. A negative result does not preclude SARS-CoV-2 infection and should not be used as the sole basis for treatment or other patient management decisions.  A negative result may occur with improper specimen collection / handling, submission of specimen other than nasopharyngeal swab, presence of viral mutation(s) within the areas targeted by this assay, and inadequate number of viral copies (<250 copies / mL). A negative result must be combined with clinical observations, patient history, and epidemiological information.  Fact Sheet for Patients:   BoilerBrush.com.cy  Fact Sheet for Healthcare Providers: https://pope.com/  This test is not yet approved or  cleared by the [place] FDA and has been authorized for detection and/or diagnosis of SARS-CoV-2 by FDA under an Emergency Use Authorization (EUA).  This EUA will remain in effect (meaning this test can be used) for the duration of the COVID-19 declaration under Section 564(b)(1) of the Act, 21 U.S.C. section  360bbb-3(b)(1), unless the authorization is terminated or revoked sooner.  Performed at [facility], [address] [place], [place] [address]     Procedures and diagnostic studies:  DG Abd 1 View  Result Date: 01/03/2020 CLINICAL DATA:  Abdominal distension EXAM: ABDOMEN - 1 VIEW COMPARISON:  None. FINDINGS: There is moderate air in the colon. There is no bowel dilatation or air-fluid level to suggest bowel obstruction. No free air. There are surgical clips in the right upper quadrant as well as postoperative changes in the lumbosacral junction regions. IMPRESSION: No bowel obstruction or free air evident. Electronically Signed   By: Bretta Bang III M.D.   On: 01/03/2020 21:50               LOS: 4 days   Cariana Karge  [facility]   Pager on [email or web address]. If 7PM-7AM, please contact night-coverage at [email or web address]     01/04/2020, 1:14 PM

## 2020-01-04 NOTE — Care Management Important Message (Signed)
Important Message  Patient Details  Name: Chad [name] MRN: [id number] Date of Birth: Apr 25, 1950   Medicare Important Message Given:  Yes  Initial Medicare IM given by Patient Access Associate on 01/03/2020 at 12:53pm.   [name] 01/04/2020, 8:21 AM

## 2020-01-04 NOTE — Evaluation (Signed)
Occupational Therapy Evaluation Patient Details Name: Chad [name] MRN: [id number] DOB: 05-Apr-1951 Today's Date: 01/04/2020    History of Present Illness Chad [name] is a 69 y.o. 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  He presented to the ED on 12/31/19 with abdominal/epigastric and chest pain since the day before.  He reported blood in vomitus and stool. He is now s/p endoscopy and laparoscopic cholecystectomy.   Clinical Impression   Chad [name] was seen for OT evaluation this date. Prior to hospital admission, pt was modified independent in all ADL/IADL management. He reports using a SPC for functional mobility. Pt lives alone in a 1 level, second floor, apartment home. He denies having a support network outside of a niece who checks on him intermittently throughout the week. Currently pt demonstrates impairments as described below (See OT problem list) which functionally limit his ability to perform ADL/self-care tasks. Pt currently requires close supervision to CGA assist during functional mobility and standing grooming tasks with OT this date.  Pt would benefit from skilled OT services to address noted impairments and functional limitations (see below for any additional details) in order to maximize safety and independence while minimizing falls risk and caregiver burden. Upon hospital discharge, recommend STR to maximize pt safety and return to PLOF.      Follow Up Recommendations  SNF    Equipment Recommendations  3 in 1 bedside commode    Recommendations for Other Services       Precautions / Restrictions Precautions Precautions: Fall Restrictions Weight Bearing Restrictions: No      Mobility Bed Mobility Overal bed mobility: Needs Assistance Bed Mobility: Supine to Sit;Sit to Supine     Supine to sit: Min guard Sit to supine: Min guard   General bed mobility comments: Moderate  increased time/effort to perform. OT attempts to educate pt on log-roll technique for improved comfort after abdominal sx, but pt states he prefers to do it his own way.  Transfers Overall transfer level: Needs assistance Equipment used: Rolling walker (2 wheeled) Transfers: Sit to/from Stand Sit to Stand: Supervision;Min guard         General transfer comment: Min guard for functional mobility. Cueing required for safety/sequencing.    Balance Overall balance assessment: Needs assistance Sitting-balance support: Feet supported;No upper extremity supported Sitting balance-Leahy Scale: Good Sitting balance - Comments: Steady static/dynamic sitting during functional tasks   Standing balance support: During functional activity;Single extremity supported;Bilateral upper extremity supported Standing balance-Leahy Scale: Fair Standing balance comment: Able to static stand briefly during oral care, but generally maintains at least one UE in contact with stable surface. Reliant on RW during functional mobility.                           ADL either performed or assessed with clinical judgement   ADL Overall ADL's : Needs assistance/impaired                                       General ADL Comments: Close supervision to CGA for standing grooming at sink. Pt requires min cueing for sequencing and materials management during task. CGA for bed/functional mobility. Pt requires significant increased time/effort to perform ADLs during session. He is functionally limited by generalized weakness and abdominal pain.     Vision  Baseline Vision/History: Wears glasses Wears Glasses: At all times Patient Visual Report: No change from baseline       Perception     Praxis      Pertinent Vitals/Pain Pain Assessment: 0-10 Pain Score: 10-Worst pain ever Faces Pain Scale: Hurts little more Pain Location: Abdominal pain Pain Descriptors / Indicators:  Grimacing;Guarding;Constant;Sore Pain Intervention(s): Limited activity within patient's tolerance;Monitored during session;Repositioned     Hand Dominance Right   Extremity/Trunk Assessment Upper Extremity Assessment Upper Extremity Assessment: Generalized weakness   Lower Extremity Assessment Lower Extremity Assessment: Generalized weakness   Cervical / Trunk Assessment Cervical / Trunk Assessment: Normal   Communication Communication Communication: No difficulties   Cognition Arousal/Alertness: Awake/alert Behavior During Therapy: WFL for tasks assessed/performed;Flat affect Overall Cognitive Status: Within Functional Limits for tasks assessed                                 General Comments: Pt occasionally slow to respond to verbal prompts/questions. He is generally able to follow multi-step VCs with increased time.   General Comments       Exercises Other Exercises Other Exercises: Pt educated on role of OT in acute setting, bed mobility techniques, safe use of AE/DME for ADL management, and routines modifications to support safety and functional independence upon hospital DC. Other Exercises: OT facilitates bed/functional mobility as well as standing grooming tasks at sink including oral care this date.   Shoulder Instructions      Home Living Family/patient expects to be discharged to:: Private residence Living Arrangements: Alone Available Help at Discharge: Other (Comment);Family (Pt reports Niece visits a few times a week. Otherwise no support at home.) Type of Home: Apartment Home Access: Stairs to enter [employer] of Steps: Full flight Entrance Stairs-Rails: Right;Left;Can reach both Home Layout: One level     Bathroom Shower/Tub: Chief Strategy Officer: Standard Bathroom Accessibility: No   Home Equipment: Cane - single point          Prior Functioning/Environment Level of Independence: Independent with  assistive device(s)        Comments: Pt independent for bathing, dressing, and BADL management. He uses a [facility] for functional mobility. +drives; endorses multiple (at least 4) falls in last 6 months.        OT Problem List: Decreased strength;Decreased coordination;Pain;Decreased activity tolerance;Decreased safety awareness;Impaired balance (sitting and/or standing);Decreased knowledge of use of DME or AE      OT Treatment/Interventions: Self-care/ADL training;Therapeutic exercise;Therapeutic activities;DME and/or AE instruction;Patient/family education;Balance training;Energy conservation    OT Goals(Current goals can be found in the care plan section) Acute Rehab OT Goals Patient Stated Goal: To get stronger OT Goal Formulation: With patient Time For Goal Achievement: 01/18/20 Potential to Achieve Goals: Good ADL Goals Pt Will Perform Grooming: standing;with modified independence Pt Will Transfer to Toilet: with modified independence;bedside commode;ambulating (c LRAD PRN for improved safety and functional indep.) Pt Will Perform Toileting - Clothing Manipulation and hygiene: with adaptive equipment;sit to/from stand;with modified independence (c LRAD PRN for improved safety and functional indep.)  OT Frequency: Min 1X/week   Barriers to D/C: Decreased caregiver support;Inaccessible home environment          Co-evaluation              AM-PAC OT "6 Clicks" Daily Activity     Outcome Measure Help from another person eating meals?: A Little Help from another person taking care of personal  grooming?: A Little Help from another person toileting, which includes using toliet, bedpan, or urinal?: A Little Help from another person bathing (including washing, rinsing, drying)?: A Lot Help from another person to put on and taking off regular upper body clothing?: A Little Help from another person to put on and taking off regular lower body clothing?: A Lot 6 Click Score: 16    End of Session Equipment Utilized During Treatment: Gait belt;Rolling walker Nurse Communication: Mobility status;Other (comment) (Telemonitor noted to be disconnected during session.)  Activity Tolerance: Patient tolerated treatment well Patient left: in bed;with call bell/phone within reach;with bed alarm set;Other (comment) (With PT in room to begin session.)  OT Visit Diagnosis: Other abnormalities of gait and mobility (R26.89);Muscle weakness (generalized) (M62.81);History of falling (Z91.81);Pain Pain - Right/Left:  (Both) Pain - part of body:  (Abdominal)                Time: 7915-0569 OT Time Calculation (min): 24 min Charges:  OT General Charges $OT Visit: 1 Visit OT Evaluation $OT Eval Moderate Complexity: 1 Mod OT Treatments $Self Care/Home Management : 8-22 mins  Rockney Ghee, M.S., OTR/L Ascom: [phone] 01/04/20, 1:26 PM

## 2020-01-04 NOTE — NC FL2 (Signed)
[place] MEDICAID FL2 LEVEL OF CARE SCREENING TOOL     IDENTIFICATION  Patient Name: Chad [name] Birthdate: 1951-04-01 Sex: male Admission Date (Current Location): 12/31/2019  [facility] and [place] Number:  Chiropodist and Address:  [facility], [address], [place], [place] [address]      Provider Number: [id number]  Attending Physician Name and Address:  Lurene Shadow, MD  Relative Name and Phone Number:       Current Level of Care: Hospital Recommended Level of Care: Skilled Nursing Facility Prior Approval Number:    Date Approved/Denied:   PASRR Number: Manual review  Discharge Plan: SNF    Current Diagnoses: Patient Active Problem List   Diagnosis Date Noted   Calculus of gallbladder with acute cholecystitis without obstruction    GI bleed 12/31/2019   GIB (gastrointestinal bleeding) 12/31/2019   Epigastric pain    Alcohol abuse    Chest pain    Cough    Tobacco abuse    Alcohol dependence with uncomplicated withdrawal (HCC) 01/23/2019   Alcohol-induced mood disorder (HCC) 01/23/2019   Anxiety and depression 01/23/2019   HTN (hypertension) 05/17/2018   Alcohol withdrawal (HCC) 05/17/2018   DVT (deep venous thrombosis) (HCC) 05/17/2018   Arthropathy 12/24/2017   Lumbar stenosis with neurogenic claudication 06/07/2015   BACK PAIN, LUMBAR 11/10/2006    Orientation RESPIRATION BLADDER Height & Weight     Self, Time, Situation, Place  Normal Continent Weight: 156 lb 8.4 oz (71 kg) Height:  5\' 9"  (175.3 cm)  BEHAVIORAL SYMPTOMS/MOOD NEUROLOGICAL BOWEL NUTRITION STATUS   (None)  (None) Incontinent Diet (Heart healthy)  AMBULATORY STATUS COMMUNICATION OF NEEDS Skin   Limited Assist Verbally Surgical wounds (Incision on abdomen: Adhesive strips.)                       Personal Care Assistance Level of Assistance  Bathing, Feeding, Dressing Bathing Assistance: Limited assistance Feeding  assistance: Limited assistance Dressing Assistance: Limited assistance     Functional Limitations Info  Sight, Hearing, Speech Sight Info: Adequate Hearing Info: Adequate Speech Info: Adequate    SPECIAL CARE FACTORS FREQUENCY  PT (By licensed PT), OT (By licensed OT)     PT Frequency: 5 x week OT Frequency: 5 x week            Contractures Contractures Info: Not present    Additional Factors Info  Code Status, Allergies, Psychotropic Code Status Info: Full code Allergies Info: Morphine and related, Ibuprofen. Psychotropic Info: Anxiety, Depression: Librium 25 mg PO BID, Lexapro 20 mg PO daily         Current Medications (01/04/2020):  This is the current hospital active medication list Current Facility-Administered Medications  Medication Dose Route Frequency Provider Last Rate Last Admin   acetaminophen (TYLENOL) tablet 650 mg  650 mg Oral Q6H PRN Rauer, 01/06/2020, RPH   650 mg at 01/03/20 2038   amLODipine (NORVASC) tablet 5 mg  5 mg Oral Daily 2039 A, DO   5 mg at 01/04/20 1000   chlordiazePOXIDE (LIBRIUM) capsule 25 mg  25 mg Oral BID 01/06/20, MD   25 mg at 01/04/20 1000   escitalopram (LEXAPRO) tablet 20 mg  20 mg Oral Daily 01/06/20, MD   20 mg at 01/04/20 1321   fentaNYL (SUBLIMAZE) injection 12.5 mcg  12.5 mcg Intravenous Q4H PRN 01/06/20, PA-C   12.5 mcg at 01/03/20 1841   folic acid (FOLVITE) tablet  1 mg  1 mg Oral Daily Duanne Guess, MD   1 mg at 01/04/20 1000   hydrOXYzine (ATARAX/VISTARIL) tablet 25 mg  25 mg Oral TID PRN Duanne Guess, MD   25 mg at 01/01/20 0156   LORazepam (ATIVAN) injection 0-4 mg  0-4 mg Intravenous Q12H Duanne Guess, MD   2 mg at 01/03/20 0932   Or   LORazepam (ATIVAN) tablet 0-4 mg  0-4 mg Oral Q12H Duanne Guess, MD       LORazepam (ATIVAN) tablet 1-4 mg  1-4 mg Oral Q1H PRN Manuela Schwartz, NP       Or   LORazepam (ATIVAN) injection 1-4 mg  1-4 mg Intravenous Q1H PRN  Manuela Schwartz, NP   2 mg at 01/03/20 2203   multivitamin with minerals tablet 1 tablet  1 tablet Oral Daily Duanne Guess, MD   1 tablet at 01/04/20 1000   nicotine (NICODERM CQ - dosed in mg/24 hours) patch 21 mg  21 mg Transdermal Daily Duanne Guess, MD   21 mg at 01/04/20 1000   ondansetron (ZOFRAN) injection 4 mg  4 mg Intravenous Once Duanne Guess, MD       ondansetron [facility]) tablet 4 mg  4 mg Oral Q6H PRN Duanne Guess, MD       Or   ondansetron [facility]) injection 4 mg  4 mg Intravenous Q6H PRN Duanne Guess, MD   4 mg at 01/03/20 2038   oxyCODONE (Oxy IR/ROXICODONE) immediate release tablet 5 mg  5 mg Oral Q4H PRN Duanne Guess, MD   5 mg at 01/04/20 1528   pantoprazole (PROTONIX) EC tablet 40 mg  40 mg Oral Daily Duanne Guess, MD   40 mg at 01/04/20 1000   thiamine tablet 100 mg  100 mg Oral Daily Duanne Guess, MD   100 mg at 01/04/20 1000   Or   thiamine (B-1) injection 100 mg  100 mg Intravenous Daily Duanne Guess, MD         Discharge Medications: Please see discharge summary for a list of discharge medications.  Relevant Imaging Results:  Relevant Lab Results:   Additional Information SS#: [id number]  Margarito Liner, LCSW

## 2020-01-05 LAB — SARS CORONAVIRUS 2 BY RT PCR (HOSPITAL ORDER, PERFORMED IN ~~LOC~~ HOSPITAL LAB): SARS Coronavirus 2: NEGATIVE

## 2020-01-05 NOTE — Progress Notes (Signed)
Progress Note    Chad [name]  JJH:[id number] DOB: 29-Aug-1950  DOA: 12/31/2019 PCP: Patient, No Pcp Per      Brief Narrative:    Medical records reviewed and are as summarized below:  Chad [name] is a 69 y.o. male with medical history significant for lumbar degenerative disc disease, GERD, hypertension, anxiety, depression, history of pneumonia, alcohol use disorder, tobacco use disorder.  He presented to the hospital because of hematemesis (vomiting with streaks of blood), abdominal pain and rectal bleeding.  Right upper quadrant sonogram was concerning for calculus cholecystitis.  He was admitted to the hospital for acute GI bleeding and acute calculus cholecystitis.  He was treated with analgesics, IV Protonix and IV fluids.  He was evaluated by the gastroenterologist.  He underwent EGD on 01/02/2020 which showed esophageal mucosal changes suspicious for short-segment Barrett's esophagus.  Biopsy could not be obtained because he was admitted with GI bleed.  He was also evaluated by the general surgeon.  He underwent laparoscopic cholecystectomy on 01/02/2020 and postop diagnosis is acute calculus cholecystitis.  He was evaluated by PT and OT for debility.  Discharge to SNF was recommended.  His condition has improved and he is deemed stable for discharge to SNF.      Assessment/Plan:   Principal Problem:   GI bleed Active Problems:   HTN (hypertension)   Alcohol dependence with uncomplicated withdrawal (HCC)   Anxiety and depression   Chest pain   Tobacco abuse   Epigastric pain   Calculus of gallbladder with acute cholecystitis without obstruction Generalized weakness  PLAN  S/p lap cholecystectomy on 01/02/2020 S/p EGD on 01/02/2020 revealing suspected Barrett's esophagus Continue analgesics as needed for pain Continue antihypertensives Continue benzodiazepines and vitamins    Body mass index is 23.11 kg/m.  Diet Order            Diet Heart Room  service appropriate? Yes; Fluid consistency: Thin  Diet effective now                    Consultants:  Gastroenterologist  General surgeon  Procedures:  EGD  Lap cholecystectomy    Medications:   . amLODipine  5 mg Oral Daily  . chlordiazePOXIDE  25 mg Oral BID  . escitalopram  20 mg Oral Daily  . folic acid  1 mg Oral Daily  . multivitamin with minerals  1 tablet Oral Daily  . nicotine  21 mg Transdermal Daily  . ondansetron (ZOFRAN) IV  4 mg Intravenous Once  . pantoprazole  40 mg Oral Daily  . thiamine  100 mg Oral Daily   Or  . thiamine  100 mg Intravenous Daily   Continuous Infusions:   Anti-infectives (From admission, onward)   Start     Dose/Rate Route Frequency Ordered Stop   01/01/20 1800  piperacillin-tazobactam (ZOSYN) IVPB 3.375 g  Status:  Discontinued        3.375 g 12.5 mL/hr over 240 Minutes Intravenous Every 8 hours 01/01/20 1715 01/02/20 1947             Family Communication/Anticipated D/C date and plan/Code Status   DVT prophylaxis: SCDs Start: 12/31/19 1628     Code Status: Full Code  Family Communication: Plan discussed with patient Disposition Plan: Possible discharge to SNF tomorrow   Status is: Inpatient  Remains inpatient appropriate because:Unsafe d/c plan   Dispo:  Patient From: Home  Planned Disposition: Home  Expected discharge date: 01/06/20  Medically stable for  discharge: Yes            Subjective:   Overnight events noted.  He still complains of abdominal pain mainly on the right side of the abdomen.  He does not remember I saw him yesterday.  Patient seems to have short-term memory problems but this was confirmed by Tia (his nurse) at the bedside.  Objective:    Vitals:   01/04/20 1404 01/04/20 2051 01/05/20 0446 01/05/20 1304  BP: 118/79 135/77 122/70 123/69  Pulse: 70 68 69 64  Resp: 16 16 18 16   Temp: 98.2 F (36.8 C) 98.3 F (36.8 C) 98 F (36.7 C) 98.1 F (36.7 C)  TempSrc:  Oral Oral Oral Oral  SpO2: 95% 94% 97% 94%  Weight:      Height:       No data found.   Intake/Output Summary (Last 24 hours) at 01/05/2020 1631 Last data filed at 01/05/2020 1300 Gross per 24 hour  Intake 1080 ml  Output 1150 ml  Net -70 ml   Filed Weights   01/01/20 0629 01/03/20 0451 01/03/20 2237  Weight: 71.5 kg 71.2 kg 71 kg    Exam:  GEN: NAD SKIN: No rash EYES: EOMI ENT: MMM CV: RRR PULM: CTA B ABD: soft, ND, right-sided abdominal tenderness but no rebound tenderness or guarding, +BS,+ surgical incisions from recent lap cholecystectomy look clean and dry CNS: AAO x 3, non focal EXT: No edema or tenderness    Data Reviewed:   I have personally reviewed following labs and imaging studies:  Labs: Labs show the following:   Basic Metabolic Panel: Recent Labs  Lab 12/31/19 1012 12/31/19 1012 01/01/20 1034 01/01/20 1034 01/02/20 0425 01/02/20 0425 01/03/20 0514 01/04/20 0456  NA 135  --  137  --  136  --  135 135  K 4.1   < > 4.0   < > 3.9   < > 3.7 3.5  CL 98  --  98  --  100  --  101 97*  CO2 20*  --  24  --  26  --  26 25  GLUCOSE 89  --  79  --  96  --  121* 97  BUN 10  --  7*  --  <5*  --  <5* <5*  CREATININE 0.59*  --  0.70  --  0.68  --  0.61 0.68  CALCIUM 9.2  --  8.9  --  9.1  --  8.4* 8.3*  MG  --   --  1.8  --   --   --  1.6* 1.7   < > = values in this interval not displayed.   GFR Estimated Creatinine Clearance: 89.6 mL/min (by C-G formula based on SCr of 0.68 mg/dL). Liver Function Tests: Recent Labs  Lab 12/31/19 1012 01/01/20 1034 01/02/20 0425 01/03/20 0514 01/04/20 0456  AST 37 35 30 32 32  ALT 15 14 16 16 19   ALKPHOS 67 57 59 54 73  BILITOT 1.3* 2.5* 1.8* 1.0 1.2  PROT 7.7 5.9* 6.3* 5.5* 5.7*  ALBUMIN 4.6 3.6 3.6 3.2* 3.1*   Recent Labs  Lab 12/31/19 1012  LIPASE 24   No results for input(s): AMMONIA in the last 168 hours. Coagulation profile Recent Labs  Lab 12/31/19 1322  INR 1.0    CBC: Recent Labs    Lab 12/31/19 1012 12/31/19 1812 01/01/20 1627 01/01/20 2221 01/02/20 0425 01/03/20 0514 01/04/20 0456  WBC 9.1  --   --   --  6.8 6.8 6.0  HGB 15.2   < > 12.5* 12.2* 13.4 12.4* 12.8*  HCT 44.2   < > 37.5* 35.6* 38.3* 35.9* 38.8*  MCV 90.0  --   --   --  88.2 90.2 93.0  PLT 274  --   --   --  182 159 142*   < > = values in this interval not displayed.   Cardiac Enzymes: No results for input(s): CKTOTAL, CKMB, CKMBINDEX, TROPONINI in the last 168 hours. BNP (last 3 results) No results for input(s): PROBNP in the last 8760 hours. CBG: No results for input(s): GLUCAP in the last 168 hours. D-Dimer: No results for input(s): DDIMER in the last 72 hours. Hgb A1c: No results for input(s): HGBA1C in the last 72 hours. Lipid Profile: No results for input(s): CHOL, HDL, LDLCALC, TRIG, CHOLHDL, LDLDIRECT in the last 72 hours. Thyroid function studies: No results for input(s): TSH, T4TOTAL, T3FREE, THYROIDAB in the last 72 hours.  Invalid input(s): FREET3 Anemia work up: No results for input(s): VITAMINB12, FOLATE, FERRITIN, TIBC, IRON, RETICCTPCT in the last 72 hours. Sepsis Labs: Recent Labs  Lab 12/31/19 1012 12/31/19 1322 12/31/19 1548 12/31/19 1812 01/01/20 1034 01/02/20 0425 01/03/20 0514 01/04/20 0456  WBC 9.1  --   --   --   --  6.8 6.8 6.0  LATICACIDVEN  --  4.3* 1.9 1.1 0.9  --   --   --     Microbiology Recent Results (from the past 240 hour(s))  SARS Coronavirus 2 by RT PCR (hospital order, performed in [facility] lab) Nasopharyngeal Nasopharyngeal Swab     Status: None   Collection Time: 12/31/19  1:21 PM   Specimen: Nasopharyngeal Swab  Result Value Ref Range Status   SARS Coronavirus 2 NEGATIVE NEGATIVE Final    Comment: (NOTE) SARS-CoV-2 target nucleic acids are NOT DETECTED.  The SARS-CoV-2 RNA is generally detectable in upper and lower respiratory specimens during the acute phase of infection. The lowest concentration of SARS-CoV-2 viral  copies this assay can detect is 250 copies / mL. A negative result does not preclude SARS-CoV-2 infection and should not be used as the sole basis for treatment or other patient management decisions.  A negative result may occur with improper specimen collection / handling, submission of specimen other than nasopharyngeal swab, presence of viral mutation(s) within the areas targeted by this assay, and inadequate number of viral copies (<250 copies / mL). A negative result must be combined with clinical observations, patient history, and epidemiological information.  Fact Sheet for Patients:   BoilerBrush.com.cyhttps://www.fda.gov/media/136312/download  Fact Sheet for Healthcare Providers: https://pope.com/https://www.fda.gov/media/136313/download  This test is not yet approved or  cleared by the [place] FDA and has been authorized for detection and/or diagnosis of SARS-CoV-2 by FDA under an Emergency Use Authorization (EUA).  This EUA will remain in effect (meaning this test can be used) for the duration of the COVID-19 declaration under Section 564(b)(1) of the Act, 21 U.S.C. section 360bbb-3(b)(1), unless the authorization is terminated or revoked sooner.  Performed at [facility], [address] [place], [place] [address]   SARS Coronavirus 2 by RT PCR (hospital order, performed in [facility] hospital lab) Nasopharyngeal Nasopharyngeal Swab     Status: None   Collection Time: 01/05/20 12:19 PM   Specimen: Nasopharyngeal Swab  Result Value Ref Range Status   SARS Coronavirus 2 NEGATIVE NEGATIVE Final    Comment: (NOTE) SARS-CoV-2 target nucleic acids are NOT DETECTED.  The SARS-CoV-2 RNA  is generally detectable in upper and lower respiratory specimens during the acute phase of infection. The lowest concentration of SARS-CoV-2 viral copies this assay can detect is 250 copies / mL. A negative result does not preclude SARS-CoV-2 infection and should not be used as the sole basis for treatment  or other patient management decisions.  A negative result may occur with improper specimen collection / handling, submission of specimen other than nasopharyngeal swab, presence of viral mutation(s) within the areas targeted by this assay, and inadequate number of viral copies (<250 copies / mL). A negative result must be combined with clinical observations, patient history, and epidemiological information.  Fact Sheet for Patients:   BoilerBrush.com.cy  Fact Sheet for Healthcare Providers: https://pope.com/  This test is not yet approved or  cleared by the [place] FDA and has been authorized for detection and/or diagnosis of SARS-CoV-2 by FDA under an Emergency Use Authorization (EUA).  This EUA will remain in effect (meaning this test can be used) for the duration of the COVID-19 declaration under Section 564(b)(1) of the Act, 21 U.S.C. section 360bbb-3(b)(1), unless the authorization is terminated or revoked sooner.  Performed at [facility], [address] [place], [place] [address]     Procedures and diagnostic studies:  DG Abd 1 View  Result Date: 01/03/2020 CLINICAL DATA:  Abdominal distension EXAM: ABDOMEN - 1 VIEW COMPARISON:  None. FINDINGS: There is moderate air in the colon. There is no bowel dilatation or air-fluid level to suggest bowel obstruction. No free air. There are surgical clips in the right upper quadrant as well as postoperative changes in the lumbosacral junction regions. IMPRESSION: No bowel obstruction or free air evident. Electronically Signed   By: Bretta Bang III M.D.   On: 01/03/2020 21:50               LOS: 5 days   Lashone Stauber  [facility]   Pager on [email or web address]. If 7PM-7AM, please contact night-coverage at [email or web address]     01/05/2020, 4:31 PM

## 2020-01-05 NOTE — Progress Notes (Signed)
Mobility Specialist - Progress Note   01/05/20 1537  Mobility  Activity Ambulated in room;Ambulated in hall  Level of Assistance Contact guard assist, steadying assist  Assistive Device Front wheel walker  Distance Ambulated (ft) 210 ft  Mobility Response Tolerated well  Mobility performed by Mobility specialist  $Mobility charge 1 Mobility    Pre-mobility: 60 HR, 121/67 BP, 96% SpO2 During mobility: 72 HR, 96% SpO2 Post-mobility: 66 HR, 133/78 BP, 98% SpO2   Pt long-sitting in bed upon arrival. Pt agreed to mobility. Pt requested to ambulate w/o AD. Pt ambulated from bedside to room door w/o AD. No LOB noted during that ambulating session, however, ambulation was slow and steady. Pt had LOB transitioning from stand to sit, resulting in pt "flopping" onto the bed. Pt educated on the importance of taking his time to prevent further injuries. Pt S2S x3 w/o AD. Pt able to maintain balance and maintain control during S2S. Pt states the abdominal pain he's been having, when laying in bed, has decreased while he is OOB. Pt states the pain went from a "8/10" to "6/10". Pt requested to ambulate more around nursing unit. Pt ambulated 160' using a RW w/ CGA. Pt had no LOB, however, pt did have SOB. SOB managed by PLB. Pt ambulated 20' back to bed w/o AD, but w/ CGA. Pt had no LOB. Overall, pt tolerated session very well. After ambulating session, pt shares w/ mobility specialist that he is "depressed" d/t "a lot of things". Pt states "I'm going to die as a lonely, bitter, old man". Mobility specialist comforted pt and encouraged him to stay positive. Pt left sitting EOB w/ all needs in reach. Nurse was notified.     Kolbi Altadonna Mobility Specialist  01/05/20, 3:50 PM

## 2020-01-05 NOTE — TOC Transition Note (Signed)
Transition of Care [facility] - CM/SW Discharge Note   Patient Details  Name: Chad [name] MRN: [id number] Date of Birth: [birth year]  [facility] CM/SW Contact:  Chapman Fitch, RN Phone Number: 01/05/2020, 2:04 PM   Clinical Narrative:    Bed offers presented.  [facility] to [facility]  Clinical sent to [facility] for PASRR Repeat covid negative.   Friend will transport at discharge.  [facility] has confirmed this is ok.     Final next level of care: Skilled Nursing Facility Barriers to Discharge: Insurance Authorization   Patient Goals and CMS Choice   CMS Medicare.gov Compare Post Acute Care list provided to:: Patient    Discharge Placement              Patient chooses bed at: [facility] [place] Patient to be transferred to facility by: friend      Discharge Plan and Services     Post Acute Care Choice: Skilled Nursing Facility                               Social Determinants of Health (SDOH) Interventions     Readmission Risk Interventions No flowsheet data found.

## 2020-01-06 MED ORDER — FOLIC ACID 1 MG PO TABS: 1.0000 mg | ORAL_TABLET | Freq: Every day | ORAL | Status: DC

## 2020-01-06 MED ORDER — HYDROCODONE-ACETAMINOPHEN 5-325 MG PO TABS: 1.0000 | ORAL_TABLET | Freq: Three times a day (TID) | ORAL | 0 refills | Status: AC | PRN

## 2020-01-06 NOTE — Progress Notes (Signed)
Mobility Specialist - Progress Note   01/06/20 1128  Mobility  Activity Refused mobility  Mobility performed by Mobility specialist    Pt refused mobility session this AM d/t having "lack of energy" and "9/10" abdominal pain. However, pt did change his mind after making a sexually inappropriate comment toward mobility specialist. Deferred session. Mobility specialist encouraged pt to rest to improve abdominal pain before dc. Nurse was notified.     Alynna Hargrove Mobility Specialist  01/06/20, 11:32 AM

## 2020-01-06 NOTE — TOC Progression Note (Addendum)
Transition of Care [facility]) - Progression Note    Patient Details  Name: Chad [name] MRN: [id number] Date of Birth: Feb 12, 1951  [facility] CM/SW Contact  Margarito Liner, LCSW Phone Number: 01/06/2020, 8:55 AM  Clinical Narrative:  PASARR obtained: [id number]. Expires 10/16. SNF admissions coordinator is checking auth status.   10:37 am: [employer] authorization approved. Will notify MD at progression rounds at 11:00.  12:16 pm: Patient stated friend is still planning on driving him to [facility]. Left message for admissions coordinator to determine time they want him to come so he can coordinate pick up time with friend. Patient stated he was considering appealing discharge but has decided against it.  Expected Discharge Plan: Skilled Nursing Facility Barriers to Discharge: Insurance Authorization  Expected Discharge Plan and Services Expected Discharge Plan: Skilled Nursing Facility     Post Acute Care Choice: Skilled Nursing Facility Living arrangements for the past 2 months: Apartment                                       Social Determinants of Health (SDOH) Interventions    Readmission Risk Interventions No flowsheet data found.

## 2020-01-06 NOTE — Care Management Important Message (Signed)
Important Message  Patient Details  Name: Chad [name] MRN: [id number] Date of Birth: April 22, 1950   Medicare Important Message Given:  Yes     [name] 01/06/2020, 11:52 AM

## 2020-01-06 NOTE — Progress Notes (Signed)
Called report to [facility]. Patient awaiting POV for transport.   Madie Reno, RN

## 2020-01-06 NOTE — Discharge Summary (Signed)
Physician Discharge Summary  Canden Cieslinski ZOX:[id number] DOB: 1951/02/17 DOA: 12/31/2019  PCP: Patient, No Pcp Per  Admit date: 12/31/2019 Discharge date: 01/06/2020  Discharge disposition: Skilled nursing facility   Recommendations for Outpatient Follow-Up:   Follow-up with physician at the nursing home within 3 days of discharge.   Discharge Diagnosis:   Principal Problem:   GI bleed Active Problems:   HTN (hypertension)   Alcohol dependence with uncomplicated withdrawal (HCC)   Anxiety and depression   Chest pain   Tobacco abuse   Epigastric pain   Calculus of gallbladder with acute cholecystitis without obstruction    Discharge Condition: Stable.  Diet recommendation:  Diet Order            Diet - low sodium heart healthy           Diet Heart Room service appropriate? Yes; Fluid consistency: Thin  Diet effective now                   Code Status: Full Code     Hospital Course:   Mr. Iren Whipp is a 69 y.o. male with medical history significant for lumbar degenerative disc disease, GERD, hypertension, anxiety, depression, history of pneumonia, alcohol use disorder, tobacco use disorder.  He presented to the hospital because of hematemesis (vomiting with streaks of blood), abdominal pain and rectal bleeding.  Right upper quadrant sonogram was concerning for calculus cholecystitis.  He was admitted to the hospital for acute GI bleeding and acute calculus cholecystitis.  He was treated with analgesics, IV Protonix and IV fluids.  He was evaluated by the gastroenterologist.  He underwent EGD on 01/02/2020 which showed esophageal mucosal changes suspicious for short-segment Barrett's esophagus.  Biopsy could not be obtained because he was admitted with GI bleed.  He was also evaluated by the general surgeon.  He underwent laparoscopic cholecystectomy on 01/02/2020 and postop diagnosis is acute calculus cholecystitis.  He was evaluated by PT and OT for  debility.  Discharge to SNF was recommended. His condition has improved and he is deemed stable for discharge to SNF.    Medical Consultants:    Gastroenterologist, Dr. Melodie Bouillon  General surgeon, Dr. Duanne Guess   Discharge Exam:    Vitals:   01/05/20 1304 01/05/20 1958 01/06/20 0558 01/06/20 1159  BP: 123/69 129/78 110/87 120/74  Pulse: 64 66 (!) 59 61  Resp: Temp: 98.1 F (36.7 C) 98.1 F (36.7 C) 97.9 F (36.6 C) 97.8 F (36.6 C)  TempSrc: Oral Oral Oral Oral  SpO2: 94% 95% 94% 98%  Weight:      Height:         GEN: NAD SKIN: No rash EYES: EOMI ENT: MMM CV: RRR PULM: CTA B ABD: soft, ND, right upper quadrant tenderness but no rebound tenderness or guarding, +BS CNS: AAO x 3, non focal EXT: No edema or tenderness   The results of significant diagnostics from this hospitalization (including imaging, microbiology, ancillary and laboratory) are listed below for reference.     Procedures and Diagnostic Studies:   DG Chest 2 View  Result Date: 12/31/2019 CLINICAL DATA:  Chest pain. History of tobacco use. Rectal bleeding EXAM: CHEST - 2 VIEW COMPARISON:  Chest radiograph and chest CT December 09, 2019 FINDINGS: Lungs are mildly hyperexpanded. There is a degree of underlying interstitial thickening without edema or airspace opacity. The heart size and pulmonary vascularity within normal limits. No adenopathy. There is degenerative change in the  thoracic spine. IMPRESSION: Suspect a degree of underlying chronic bronchitis. No edema or airspace opacity. Cardiac silhouette within normal limits. Electronically Signed   By: Bretta Bang III M.D.   On: 12/31/2019 10:26   CT ABDOMEN PELVIS W CONTRAST  Result Date: 12/31/2019 CLINICAL DATA:  Abdominal pain with rectal bleeding. EXAM: CT ABDOMEN AND PELVIS WITH CONTRAST TECHNIQUE: Multidetector CT imaging of the abdomen and pelvis was performed using the standard protocol following bolus  administration of intravenous contrast. CONTRAST:  OMNIPAQUE IOHEXOL 300 MG/ML  SOLN COMPARISON:  12/09/2019 FINDINGS: Lower chest: The lung bases are clear. The heart size is normal. Hepatobiliary: There is decreased hepatic attenuation suggestive of hepatic steatosis. Cholelithiasis without acute inflammation.There is no biliary ductal dilation. Pancreas: Normal contours without ductal dilatation. No peripancreatic fluid collection. Spleen: Unremarkable. Adrenals/Urinary Tract: --Adrenal glands: Unremarkable. --Right kidney/ureter: No hydronephrosis or radiopaque kidney stones. --Left kidney/ureter: There is a punctate nonobstructing stone in the lower pole the left kidney. --Urinary bladder: Unremarkable. Stomach/Bowel: --Stomach/Duodenum: No hiatal hernia or other gastric abnormality. Normal duodenal course and caliber. --Small bowel: Unremarkable. --Colon: Rectosigmoid diverticulosis without acute inflammation. --Appendix: Normal. Vascular/Lymphatic: Atherosclerotic calcification is present within the non-aneurysmal abdominal aorta, without hemodynamically significant stenosis. --No retroperitoneal lymphadenopathy. --No mesenteric lymphadenopathy. --No pelvic or inguinal lymphadenopathy. Reproductive: Unremarkable Other: No ascites or free air. The abdominal wall is normal. Musculoskeletal. Multilevel degenerative changes are noted throughout the lumbar spine. The patient is status post prior L4-L5 posterior fusion. The hardware is intact. 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 IMPRESSION: 1. Rectosigmoid diverticulosis without acute inflammation. 2. Hepatic steatosis. 3. Cholelithiasis without acute inflammation. 4. Punctate nonobstructing stone in the lower pole the left kidney. 5. 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 Aortic Atherosclerosis (ICD10-I70.0). Electronically Signed   By: Katherine Mantle M.D.   On: 12/31/2019 15:01   US Abdomen Limited  RUQ  Result Date: 01/01/2020 CLINICAL DATA:  Elevated bilirubin. EXAM: ULTRASOUND ABDOMEN LIMITED RIGHT UPPER QUADRANT COMPARISON:  CT dated 12/31/2019 FINDINGS: Gallbladder: Multiple gallstones are noted. There is no gallbladder wall thickening. There is no pericholecystic free fluid. There appears to be a gallstone wedged at the gallbladder neck. The sonographic Eulah Pont sign is reported as positive. Common bile duct: Diameter: 6 mm Liver: Diffuse increased echogenicity with slightly heterogeneous liver. Appearance typically secondary to fatty infiltration. Fibrosis secondary consideration. No secondary findings of cirrhosis noted. No focal hepatic lesion or intrahepatic biliary duct dilatation. Portal vein is patent on color Doppler imaging with normal direction of blood flow towards the liver. Other: None. IMPRESSION: Sonographic findings are equivocal for calculus acute cholecystitis. Consider HIDA scan for further evaluation. Electronically Signed   By: Katherine Mantle M.D.   On: 01/01/2020 14:51     Labs:   Basic Metabolic Panel: Recent Labs  Lab 12/31/19 1012 12/31/19 1012 01/01/20 1034 01/01/20 1034 01/02/20 0425 01/02/20 0425 01/03/20 0514 01/04/20 0456  NA 135  --  137  --  136  --  135 135  K 4.1   < > 4.0   < > 3.9   < > 3.7 3.5  CL 98  --  98  --  100  --  101 97*  CO2 20*  --  24  --  26  --  26 25  GLUCOSE 89  --  79  --  96  --  121* 97  BUN 10  --  7*  --  <5*  --  <5* <5*  CREATININE 0.59*  --  0.70  --  0.68  --  0.61 0.68  CALCIUM 9.2  --  8.9  --  9.1  --  8.4* 8.3*  MG  --   --  1.8  --   --   --  1.6* 1.7   < > = values in this interval not displayed.   GFR Estimated Creatinine Clearance: 89.6 mL/min (by C-G formula based on SCr of 0.68 mg/dL). Liver Function Tests: Recent Labs  Lab 12/31/19 1012 01/01/20 1034 01/02/20 0425 01/03/20 0514 01/04/20 0456  AST 37 35 30 32 32  ALT 15 14 16 16 19   ALKPHOS 67 57 59 54 73  BILITOT 1.3* 2.5* 1.8* 1.0 1.2   PROT 7.7 5.9* 6.3* 5.5* 5.7*  ALBUMIN 4.6 3.6 3.6 3.2* 3.1*   Recent Labs  Lab 12/31/19 1012  LIPASE 24   No results for input(s): AMMONIA in the last 168 hours. Coagulation profile Recent Labs  Lab 12/31/19 1322  INR 1.0    CBC: Recent Labs  Lab 12/31/19 1012 12/31/19 1812 01/01/20 1627 01/01/20 2221 01/02/20 0425 01/03/20 0514 01/04/20 0456  WBC 9.1  --   --   --  6.8 6.8 6.0  HGB 15.2   < > 12.5* 12.2* 13.4 12.4* 12.8*  HCT 44.2   < > 37.5* 35.6* 38.3* 35.9* 38.8*  MCV 90.0  --   --   --  88.2 90.2 93.0  PLT 274  --   --   --  182 159 142*   < > = values in this interval not displayed.   Cardiac Enzymes: No results for input(s): CKTOTAL, CKMB, CKMBINDEX, TROPONINI in the last 168 hours. BNP: Invalid input(s): POCBNP CBG: No results for input(s): GLUCAP in the last 168 hours. D-Dimer No results for input(s): DDIMER in the last 72 hours. Hgb A1c No results for input(s): HGBA1C in the last 72 hours. Lipid Profile No results for input(s): CHOL, HDL, LDLCALC, TRIG, CHOLHDL, LDLDIRECT in the last 72 hours. Thyroid function studies No results for input(s): TSH, T4TOTAL, T3FREE, THYROIDAB in the last 72 hours.  Invalid input(s): FREET3 Anemia work up No results for input(s): VITAMINB12, FOLATE, FERRITIN, TIBC, IRON, RETICCTPCT in the last 72 hours. Microbiology Recent Results (from the past 240 hour(s))  SARS Coronavirus 2 by RT PCR (hospital order, performed in [facility] hospital lab) Nasopharyngeal Nasopharyngeal Swab     Status: None   Collection Time: 12/31/19  1:21 PM   Specimen: Nasopharyngeal Swab  Result Value Ref Range Status   SARS Coronavirus 2 NEGATIVE NEGATIVE Final    Comment: (NOTE) SARS-CoV-2 target nucleic acids are NOT DETECTED.  The SARS-CoV-2 RNA is generally detectable in upper and lower respiratory specimens during the acute phase of infection. The lowest concentration of SARS-CoV-2 viral copies this assay can detect is 250 copies /  mL. A negative result does not preclude SARS-CoV-2 infection and should not be used as the sole basis for treatment or other patient management decisions.  A negative result may occur with improper specimen collection / handling, submission of specimen other than nasopharyngeal swab, presence of viral mutation(s) within the areas targeted by this assay, and inadequate number of viral copies (<250 copies / mL). A negative result must be combined with clinical observations, patient history, and epidemiological information.  Fact Sheet for Patients:   03/01/20  Fact Sheet for Healthcare Providers: BoilerBrush.com.cy  This test is not yet approved or  cleared by the https://pope.com/ FDA  and has been authorized for detection and/or diagnosis of SARS-CoV-2 by FDA under an Emergency Use Authorization (EUA).  This EUA will remain in effect (meaning this test can be used) for the duration of the COVID-19 declaration under Section 564(b)(1) of the Act, 21 U.S.C. section 360bbb-3(b)(1), unless the authorization is terminated or revoked sooner.  Performed at [facility], [address] [place], [place] [address]   SARS Coronavirus 2 by RT PCR (hospital order, performed in [facility] hospital lab) Nasopharyngeal Nasopharyngeal Swab     Status: None   Collection Time: 01/05/20 12:19 PM   Specimen: Nasopharyngeal Swab  Result Value Ref Range Status   SARS Coronavirus 2 NEGATIVE NEGATIVE Final    Comment: (NOTE) SARS-CoV-2 target nucleic acids are NOT DETECTED.  The SARS-CoV-2 RNA is generally detectable in upper and lower respiratory specimens during the acute phase of infection. The lowest concentration of SARS-CoV-2 viral copies this assay can detect is 250 copies / mL. A negative result does not preclude SARS-CoV-2 infection and should not be used as the sole basis for treatment or other patient management decisions.  A  negative result may occur with improper specimen collection / handling, submission of specimen other than nasopharyngeal swab, presence of viral mutation(s) within the areas targeted by this assay, and inadequate number of viral copies (<250 copies / mL). A negative result must be combined with clinical observations, patient history, and epidemiological information.  Fact Sheet for Patients:   BoilerBrush.com.cy  Fact Sheet for Healthcare Providers: https://pope.com/  This test is not yet approved or  cleared by the [place] FDA and has been authorized for detection and/or diagnosis of SARS-CoV-2 by FDA under an Emergency Use Authorization (EUA).  This EUA will remain in effect (meaning this test can be used) for the duration of the COVID-19 declaration under Section 564(b)(1) of the Act, 21 U.S.C. section 360bbb-3(b)(1), unless the authorization is terminated or revoked sooner.  Performed at [facility], [address] [place], [place] [address]      Discharge Instructions:   Discharge Instructions    Diet - low sodium heart healthy   Complete by: As directed    Discharge instructions   Complete by: As directed    Avoid alcohol and tobacco products.  Follow up with physician at the nursing home within 3 days of discharge   Increase activity slowly   Complete by: As directed    No wound care   Complete by: As directed      Allergies as of 01/06/2020      Reactions   Morphine And Related Other (See Comments)   Causes "bad disposition"   Ibuprofen Other (See Comments)   Gi upset      Medication List    TAKE these medications   acetaminophen 500 MG tablet Commonly known as: TYLENOL Take 1,000 mg by mouth every 6 (six) hours as needed.   amLODipine 5 MG tablet Commonly known as: NORVASC Take 1 tablet (5 mg total) by mouth every evening.   escitalopram 20 MG tablet Commonly known as: LEXAPRO Take  20 mg by mouth daily.   feeding supplement (ENSURE ENLIVE) Liqd Take 237 mLs by mouth 2 (two) times daily between meals.   folic acid 1 MG tablet Commonly known as: FOLVITE Take 1 tablet (1 mg total) by mouth daily. Start taking on: January 07, 2020   HYDROcodone-acetaminophen 5-325 MG tablet Commonly known as: NORCO/VICODIN Take 1 tablet by mouth every 8 (eight) hours as  needed for up to 3 days for severe pain. What changed: when to take this   hydrOXYzine 25 MG capsule Commonly known as: Vistaril Take 1 capsule (25 mg total) by mouth 3 (three) times daily as needed for anxiety or itching.   nicotine 21 mg/24hr patch Commonly known as: NICODERM CQ - dosed in mg/24 hours One 21mg  patch chest wall daily (okay to substitute generic)   ondansetron 4 MG tablet Commonly known as: Zofran Take 1 tablet (4 mg total) by mouth every 8 (eight) hours as needed for nausea or vomiting.   pantoprazole 40 MG tablet Commonly known as: Protonix Take 1 tablet (40 mg total) by mouth 2 (two) times daily.   thiamine 100 MG tablet Take 1 tablet (100 mg total) by mouth daily.   traZODone 100 MG tablet Commonly known as: DESYREL Take 1 tablet (100 mg total) by mouth at bedtime.       Contact information for follow-up providers    Donovan KailSchulz, Zachary R, PA-C. Go on 01/20/2020.   Specialty: Physician Assistant Why: Kathlen Brunswick10AM Contact information: [address] [place] [id number] [phone]            Contact information for after-discharge care    Destination    [facility] Preferred SNF.   Service: Skilled Nursing Contact information: [address] [place] [phone]                   Time coordinating discharge: 32 minutes  Signed:  Lurene ShadowBERNARD [place]  [facility] 01/06/2020, 12:08 PM   Pager on [email or web address]. If 7PM-7AM, please contact night-coverage at [email or web address]

## 2020-01-06 NOTE — Progress Notes (Signed)
Chad [name] to be D/C'd Nursing Home per MD order.  Discussed prescriptions and follow up appointments with the patient. Prescriptions given to patient, medication list explained in detail. Pt verbalized understanding.  Allergies as of 01/06/2020      Reactions   Morphine And Related Other (See Comments)   Causes "bad disposition"   Ibuprofen Other (See Comments)   Gi upset      Medication List    TAKE these medications   acetaminophen 500 MG tablet Commonly known as: TYLENOL Take 1,000 mg by mouth every 6 (six) hours as needed.   amLODipine 5 MG tablet Commonly known as: NORVASC Take 1 tablet (5 mg total) by mouth every evening.   escitalopram 20 MG tablet Commonly known as: LEXAPRO Take 20 mg by mouth daily.   feeding supplement (ENSURE ENLIVE) Liqd Take 237 mLs by mouth 2 (two) times daily between meals.   folic acid 1 MG tablet Commonly known as: FOLVITE Take 1 tablet (1 mg total) by mouth daily. Start taking on: January 07, 2020   HYDROcodone-acetaminophen 5-325 MG tablet Commonly known as: NORCO/VICODIN Take 1 tablet by mouth every 8 (eight) hours as needed for up to 3 days for severe pain. What changed: when to take this   hydrOXYzine 25 MG capsule Commonly known as: Vistaril Take 1 capsule (25 mg total) by mouth 3 (three) times daily as needed for anxiety or itching.   nicotine 21 mg/24hr patch Commonly known as: NICODERM CQ - dosed in mg/24 hours One 21mg  patch chest wall daily (okay to substitute generic)   ondansetron 4 MG tablet Commonly known as: Zofran Take 1 tablet (4 mg total) by mouth every 8 (eight) hours as needed for nausea or vomiting.   pantoprazole 40 MG tablet Commonly known as: Protonix Take 1 tablet (40 mg total) by mouth 2 (two) times daily.   thiamine 100 MG tablet Take 1 tablet (100 mg total) by mouth daily.   traZODone 100 MG tablet Commonly known as: DESYREL Take 1 tablet (100 mg total) by mouth at bedtime.        Vitals:   01/06/20 0558 01/06/20 1159  BP: 110/87 120/74  Pulse: (!) 59 61  Resp: 18 16  Temp: 97.9 F (36.6 C) 97.8 F (36.6 C)  SpO2: 94% 98%    Skin clean, dry and intact without evidence of skin break down, no evidence of skin tears noted. IV catheter discontinued intact. Site without signs and symptoms of complications. Dressing and pressure applied. Pt denies pain at this time. No complaints noted.  An After Visit Summary was printed and given to the patient. Patient escorted via WC, and D/C [facility] via private auto.  01/08/20, RN

## 2020-01-06 NOTE — TOC Transition Note (Signed)
Transition of Care [facility]) - CM/SW Discharge Note   Patient Details  Name: Chad [name] MRN: [id number] Date of Birth: [birth year]  [facility] of Care [facility] CM/SW Contact:  Margarito Liner, LCSW Phone Number: 01/06/2020, 12:39 PM   Clinical Narrative:  Patient has orders to discharge to [facility] today. They can take him as early as 2:00. Nurse will let him know what time his friend can pick him up. No further concerns. CSW signing off.   Final next level of care: Skilled Nursing Facility Barriers to Discharge: Barriers Resolved   Patient Goals and CMS Choice   CMS Medicare.gov Compare Post Acute Care list provided to:: Patient Choice offered to / list presented to : Patient  Discharge Placement PASRR number recieved: 01/06/20            Patient chooses bed at: [facility] Patient to be transferred to facility by: Friend will pick him up   Patient and family notified of of transfer: 01/06/20  Discharge Plan and Services     Post Acute Care Choice: Skilled Nursing Facility                               Social Determinants of Health (SDOH) Interventions     Readmission Risk Interventions No flowsheet data found.

## 2020-01-13 ENCOUNTER — Emergency Department: Payer: Medicare HMO

## 2020-01-13 ENCOUNTER — Other Ambulatory Visit: Payer: Self-pay

## 2020-01-13 ENCOUNTER — Encounter: Payer: Self-pay | Admitting: Emergency Medicine

## 2020-01-13 DIAGNOSIS — F10231 Alcohol dependence with withdrawal delirium: Secondary | ICD-10-CM | POA: Diagnosis not present

## 2020-01-13 DIAGNOSIS — S72009A Fracture of unspecified part of neck of unspecified femur, initial encounter for closed fracture: Secondary | ICD-10-CM | POA: Diagnosis not present

## 2020-01-13 DIAGNOSIS — S72145A Nondisplaced intertrochanteric fracture of left femur, initial encounter for closed fracture: Principal | ICD-10-CM | POA: Diagnosis present

## 2020-01-13 DIAGNOSIS — I10 Essential (primary) hypertension: Secondary | ICD-10-CM | POA: Diagnosis present

## 2020-01-13 DIAGNOSIS — Z79899 Other long term (current) drug therapy: Secondary | ICD-10-CM

## 2020-01-13 DIAGNOSIS — W010XXA Fall on same level from slipping, tripping and stumbling without subsequent striking against object, initial encounter: Secondary | ICD-10-CM | POA: Diagnosis present

## 2020-01-13 DIAGNOSIS — D62 Acute posthemorrhagic anemia: Secondary | ICD-10-CM | POA: Diagnosis not present

## 2020-01-13 DIAGNOSIS — K2270 Barrett's esophagus without dysplasia: Secondary | ICD-10-CM | POA: Diagnosis present

## 2020-01-13 DIAGNOSIS — F418 Other specified anxiety disorders: Secondary | ICD-10-CM | POA: Diagnosis present

## 2020-01-13 DIAGNOSIS — D509 Iron deficiency anemia, unspecified: Secondary | ICD-10-CM | POA: Diagnosis present

## 2020-01-13 DIAGNOSIS — Z20822 Contact with and (suspected) exposure to covid-19: Secondary | ICD-10-CM | POA: Diagnosis present

## 2020-01-13 DIAGNOSIS — K219 Gastro-esophageal reflux disease without esophagitis: Secondary | ICD-10-CM | POA: Diagnosis present

## 2020-01-13 DIAGNOSIS — F17210 Nicotine dependence, cigarettes, uncomplicated: Secondary | ICD-10-CM | POA: Diagnosis present

## 2020-01-13 DIAGNOSIS — S0083XA Contusion of other part of head, initial encounter: Secondary | ICD-10-CM | POA: Diagnosis present

## 2020-01-13 LAB — CBC WITH DIFFERENTIAL/PLATELET
Abs Immature Granulocytes: 0.06 10*3/uL (ref 0.00–0.07)
Basophils Absolute: 0.2 10*3/uL — ABNORMAL HIGH (ref 0.0–0.1)
Basophils Relative: 3 %
Eosinophils Absolute: 0.5 10*3/uL (ref 0.0–0.5)
Eosinophils Relative: 7 %
HCT: 39.5 % (ref 39.0–52.0)
Hemoglobin: 13.8 g/dL (ref 13.0–17.0)
Immature Granulocytes: 1 %
Lymphocytes Relative: 22 %
Lymphs Abs: 1.6 10*3/uL (ref 0.7–4.0)
MCH: 31.4 pg (ref 26.0–34.0)
MCHC: 34.9 g/dL (ref 30.0–36.0)
MCV: 90 fL (ref 80.0–100.0)
Monocytes Absolute: 0.6 10*3/uL (ref 0.1–1.0)
Monocytes Relative: 9 %
Neutro Abs: 4.1 10*3/uL (ref 1.7–7.7)
Neutrophils Relative %: 58 %
Platelets: 340 10*3/uL (ref 150–400)
RBC: 4.39 MIL/uL (ref 4.22–5.81)
RDW: 14.8 % (ref 11.5–15.5)
WBC: 7.1 10*3/uL (ref 4.0–10.5)
nRBC: 0 % (ref 0.0–0.2)

## 2020-01-13 LAB — BASIC METABOLIC PANEL
Anion gap: 13 (ref 5–15)
BUN: 9 mg/dL (ref 8–23)
CO2: 24 mmol/L (ref 22–32)
Calcium: 9 mg/dL (ref 8.9–10.3)
Chloride: 104 mmol/L (ref 98–111)
Creatinine, Ser: 0.74 mg/dL (ref 0.61–1.24)
GFR calc Af Amer: >60 mL/min (ref >60)
GFR calc non Af Amer: >60 mL/min (ref >60)
Glucose, Bld: 107 mg/dL — ABNORMAL HIGH (ref 70–99)
Potassium: 4 mmol/L (ref 3.5–5.1)
Sodium: 141 mmol/L (ref 135–145)

## 2020-01-13 NOTE — ED Notes (Signed)
First Nurse Note:  Patient coming [facility] for fall this evening. Patient lost balance while walking; patient is c/o pain in left hip post fall. EMS vital signs stable.

## 2020-01-13 NOTE — ED Triage Notes (Signed)
Pt arrives via [facility] with c/o head injury and left hip injury. Pt states that he fell around 1930 and that he experienced brief LOC Pt appears in NAD.

## 2020-01-14 ENCOUNTER — Inpatient Hospital Stay: Payer: Medicare HMO | Admitting: Anesthesiology

## 2020-01-14 ENCOUNTER — Emergency Department: Payer: Medicare HMO

## 2020-01-14 ENCOUNTER — Inpatient Hospital Stay
Admission: EM | Admit: 2020-01-14 | Discharge: 2020-01-19 | DRG: 481 | Disposition: A | Discharge status: Skilled Nursing Facility | Payer: Medicare HMO | Attending: Internal Medicine | Admitting: Internal Medicine

## 2020-01-14 ENCOUNTER — Inpatient Hospital Stay: Payer: Medicare HMO

## 2020-01-14 ENCOUNTER — Encounter
Admission: EM | Disposition: A | Discharge status: Skilled Nursing Facility | Payer: Self-pay | Source: Home / Self Care | Attending: Internal Medicine

## 2020-01-14 ENCOUNTER — Encounter: Payer: Self-pay | Admitting: Family Medicine

## 2020-01-14 DIAGNOSIS — K2270 Barrett's esophagus without dysplasia: Secondary | ICD-10-CM | POA: Diagnosis present

## 2020-01-14 DIAGNOSIS — F10121 Alcohol abuse with intoxication delirium: Secondary | ICD-10-CM | POA: Diagnosis not present

## 2020-01-14 DIAGNOSIS — I10 Essential (primary) hypertension: Secondary | ICD-10-CM | POA: Diagnosis present

## 2020-01-14 DIAGNOSIS — S72002D Fracture of unspecified part of neck of left femur, subsequent encounter for closed fracture with routine healing: Secondary | ICD-10-CM | POA: Diagnosis not present

## 2020-01-14 DIAGNOSIS — F10239 Alcohol dependence with withdrawal, unspecified: Secondary | ICD-10-CM | POA: Diagnosis not present

## 2020-01-14 DIAGNOSIS — Z720 Tobacco use: Secondary | ICD-10-CM | POA: Diagnosis not present

## 2020-01-14 DIAGNOSIS — F419 Anxiety disorder, unspecified: Secondary | ICD-10-CM | POA: Diagnosis not present

## 2020-01-14 DIAGNOSIS — D62 Acute posthemorrhagic anemia: Secondary | ICD-10-CM | POA: Diagnosis not present

## 2020-01-14 DIAGNOSIS — Z79899 Other long term (current) drug therapy: Secondary | ICD-10-CM | POA: Diagnosis not present

## 2020-01-14 DIAGNOSIS — S0083XA Contusion of other part of head, initial encounter: Secondary | ICD-10-CM | POA: Diagnosis present

## 2020-01-14 DIAGNOSIS — F418 Other specified anxiety disorders: Secondary | ICD-10-CM | POA: Diagnosis present

## 2020-01-14 DIAGNOSIS — W19XXXA Unspecified fall, initial encounter: Secondary | ICD-10-CM

## 2020-01-14 DIAGNOSIS — W010XXA Fall on same level from slipping, tripping and stumbling without subsequent striking against object, initial encounter: Secondary | ICD-10-CM | POA: Diagnosis present

## 2020-01-14 DIAGNOSIS — D509 Iron deficiency anemia, unspecified: Secondary | ICD-10-CM | POA: Diagnosis present

## 2020-01-14 DIAGNOSIS — F10939 Alcohol use, unspecified with withdrawal, unspecified: Secondary | ICD-10-CM

## 2020-01-14 DIAGNOSIS — K219 Gastro-esophageal reflux disease without esophagitis: Secondary | ICD-10-CM | POA: Diagnosis present

## 2020-01-14 DIAGNOSIS — Z20822 Contact with and (suspected) exposure to covid-19: Secondary | ICD-10-CM | POA: Diagnosis present

## 2020-01-14 DIAGNOSIS — S72009A Fracture of unspecified part of neck of unspecified femur, initial encounter for closed fracture: Secondary | ICD-10-CM

## 2020-01-14 DIAGNOSIS — F10231 Alcohol dependence with withdrawal delirium: Secondary | ICD-10-CM | POA: Diagnosis not present

## 2020-01-14 DIAGNOSIS — S72002A Fracture of unspecified part of neck of left femur, initial encounter for closed fracture: Secondary | ICD-10-CM

## 2020-01-14 DIAGNOSIS — D649 Anemia, unspecified: Secondary | ICD-10-CM

## 2020-01-14 DIAGNOSIS — S72145A Nondisplaced intertrochanteric fracture of left femur, initial encounter for closed fracture: Secondary | ICD-10-CM | POA: Diagnosis present

## 2020-01-14 DIAGNOSIS — F329 Major depressive disorder, single episode, unspecified: Secondary | ICD-10-CM | POA: Diagnosis not present

## 2020-01-14 DIAGNOSIS — S72009D Fracture of unspecified part of neck of unspecified femur, subsequent encounter for closed fracture with routine healing: Secondary | ICD-10-CM

## 2020-01-14 DIAGNOSIS — F17210 Nicotine dependence, cigarettes, uncomplicated: Secondary | ICD-10-CM | POA: Diagnosis present

## 2020-01-14 DIAGNOSIS — F1010 Alcohol abuse, uncomplicated: Secondary | ICD-10-CM | POA: Diagnosis not present

## 2020-01-14 HISTORY — PX: INTRAMEDULLARY (IM) NAIL INTERTROCHANTERIC: SHX5875

## 2020-01-14 LAB — BASIC METABOLIC PANEL
Anion gap: 10 (ref 5–15)
BUN: 11 mg/dL (ref 8–23)
CO2: 28 mmol/L (ref 22–32)
Calcium: 8.6 mg/dL — ABNORMAL LOW (ref 8.9–10.3)
Chloride: 103 mmol/L (ref 98–111)
Creatinine, Ser: 0.73 mg/dL (ref 0.61–1.24)
GFR calc Af Amer: >60 mL/min (ref >60)
GFR calc non Af Amer: >60 mL/min (ref >60)
Glucose, Bld: 86 mg/dL (ref 70–99)
Potassium: 4.1 mmol/L (ref 3.5–5.1)
Sodium: 141 mmol/L (ref 135–145)

## 2020-01-14 LAB — CBC
HCT: 34.8 % — ABNORMAL LOW (ref 39.0–52.0)
Hemoglobin: 11.8 g/dL — ABNORMAL LOW (ref 13.0–17.0)
MCH: 30.4 pg (ref 26.0–34.0)
MCHC: 33.9 g/dL (ref 30.0–36.0)
MCV: 89.7 fL (ref 80.0–100.0)
Platelets: 299 10*3/uL (ref 150–400)
RBC: 3.88 MIL/uL — ABNORMAL LOW (ref 4.22–5.81)
RDW: 14.7 % (ref 11.5–15.5)
WBC: 9 10*3/uL (ref 4.0–10.5)
nRBC: 0 % (ref 0.0–0.2)

## 2020-01-14 LAB — TYPE AND SCREEN
ABO/RH(D): A POS
Antibody Screen: NEGATIVE

## 2020-01-14 LAB — RESPIRATORY PANEL BY RT PCR (FLU A&B, COVID)
Influenza A by PCR: NEGATIVE
Influenza B by PCR: NEGATIVE
SARS Coronavirus 2 by RT PCR: NEGATIVE

## 2020-01-14 LAB — PROTIME-INR
INR: 1.1 (ref 0.8–1.2)
Prothrombin Time: 13.8 seconds (ref 11.4–15.2)

## 2020-01-14 LAB — APTT: aPTT: 28 seconds (ref 24–36)

## 2020-01-14 SURGERY — FIXATION, FRACTURE, INTERTROCHANTERIC, WITH INTRAMEDULLARY ROD
Anesthesia: Spinal | Laterality: Left

## 2020-01-14 MED ORDER — MENTHOL 3 MG MT LOZG
1.0000 | LOZENGE | OROMUCOSAL | Status: DC | PRN
  Filled 2020-01-14: qty 9

## 2020-01-14 MED ORDER — ONDANSETRON HCL 4 MG/2ML IJ SOLN: 4.0000 mg | Freq: Four times a day (QID) | INTRAMUSCULAR | Status: DC | PRN

## 2020-01-14 MED ORDER — THIAMINE HCL 100 MG PO TABS: 100.0000 mg | ORAL_TABLET | Freq: Every day | ORAL | Status: DC

## 2020-01-14 MED ORDER — TRAZODONE HCL 100 MG PO TABS
100.0000 mg | ORAL_TABLET | Freq: Every day | ORAL | Status: DC
  Administered 2020-01-14 – 2020-01-18 (×5): 100 mg via ORAL
  Filled 2020-01-14 (×5): qty 1

## 2020-01-14 MED ORDER — FENTANYL CITRATE (PF) 100 MCG/2ML IJ SOLN
INTRAMUSCULAR | Status: AC
  Filled 2020-01-14: qty 2

## 2020-01-14 MED ORDER — CEFAZOLIN SODIUM-DEXTROSE 2-4 GM/100ML-% IV SOLN
2.0000 g | Freq: Four times a day (QID) | INTRAVENOUS | Status: AC
  Administered 2020-01-14 – 2020-01-15 (×3): 2 g via INTRAVENOUS
  Filled 2020-01-14 (×3): qty 100

## 2020-01-14 MED ORDER — SODIUM CHLORIDE 0.9 % IV SOLN: INTRAVENOUS | Status: DC

## 2020-01-14 MED ORDER — ZOLPIDEM TARTRATE 5 MG PO TABS
5.0000 mg | ORAL_TABLET | Freq: Every evening | ORAL | Status: DC | PRN
  Administered 2020-01-17: 5 mg via ORAL
  Filled 2020-01-14: qty 1

## 2020-01-14 MED ORDER — MIDAZOLAM HCL 5 MG/5ML IJ SOLN
INTRAMUSCULAR | Status: DC | PRN
  Administered 2020-01-14: 2 mg via INTRAVENOUS

## 2020-01-14 MED ORDER — ACETAMINOPHEN 325 MG PO TABS: 650.0000 mg | ORAL_TABLET | Freq: Four times a day (QID) | ORAL | Status: DC | PRN

## 2020-01-14 MED ORDER — LORAZEPAM 2 MG/ML IJ SOLN
INTRAMUSCULAR | Status: AC
  Filled 2020-01-14: qty 1

## 2020-01-14 MED ORDER — PROPOFOL 10 MG/ML IV BOLUS
INTRAVENOUS | Status: AC
  Filled 2020-01-14: qty 20

## 2020-01-14 MED ORDER — FENTANYL CITRATE (PF) 100 MCG/2ML IJ SOLN
25.0000 ug | INTRAMUSCULAR | Status: DC | PRN
  Administered 2020-01-14 (×3): 25 ug via INTRAVENOUS
  Filled 2020-01-14 (×3): qty 2

## 2020-01-14 MED ORDER — LORAZEPAM 2 MG/ML IJ SOLN
0.0000 mg | Freq: Four times a day (QID) | INTRAMUSCULAR | Status: AC
  Administered 2020-01-14: 1 mg via INTRAVENOUS
  Administered 2020-01-14: 2 mg via INTRAVENOUS
  Filled 2020-01-14 (×2): qty 1

## 2020-01-14 MED ORDER — AMLODIPINE BESYLATE 5 MG PO TABS
5.0000 mg | ORAL_TABLET | Freq: Every evening | ORAL | Status: DC
  Administered 2020-01-14 – 2020-01-19 (×6): 5 mg via ORAL
  Filled 2020-01-14 (×6): qty 1

## 2020-01-14 MED ORDER — PROPOFOL 10 MG/ML IV BOLUS
INTRAVENOUS | Status: DC | PRN
  Administered 2020-01-14 (×2): 20 mg via INTRAVENOUS

## 2020-01-14 MED ORDER — METOCLOPRAMIDE HCL 10 MG PO TABS: 5.0000 mg | ORAL_TABLET | Freq: Three times a day (TID) | ORAL | Status: DC | PRN

## 2020-01-14 MED ORDER — HYDROXYZINE HCL 25 MG PO TABS
25.0000 mg | ORAL_TABLET | Freq: Three times a day (TID) | ORAL | Status: DC | PRN
  Administered 2020-01-15 – 2020-01-17 (×2): 25 mg via ORAL
  Filled 2020-01-14 (×3): qty 1

## 2020-01-14 MED ORDER — EPHEDRINE 5 MG/ML INJ
INTRAVENOUS | Status: AC
  Filled 2020-01-14: qty 10

## 2020-01-14 MED ORDER — LACTATED RINGERS IV SOLN: INTRAVENOUS | Status: DC | PRN

## 2020-01-14 MED ORDER — NEOMYCIN-POLYMYXIN B GU 40-200000 IR SOLN
Status: DC | PRN
  Administered 2020-01-14: 2 mL

## 2020-01-14 MED ORDER — HYDROCODONE-ACETAMINOPHEN 7.5-325 MG PO TABS
1.0000 | ORAL_TABLET | ORAL | Status: DC | PRN
  Administered 2020-01-15 – 2020-01-19 (×7): 2 via ORAL
  Filled 2020-01-14 (×8): qty 2

## 2020-01-14 MED ORDER — ACETAMINOPHEN 325 MG PO TABS: 325.0000 mg | ORAL_TABLET | Freq: Four times a day (QID) | ORAL | Status: DC | PRN

## 2020-01-14 MED ORDER — DOCUSATE SODIUM 100 MG PO CAPS
100.0000 mg | ORAL_CAPSULE | Freq: Two times a day (BID) | ORAL | Status: DC
  Administered 2020-01-14 – 2020-01-19 (×10): 100 mg via ORAL
  Filled 2020-01-14 (×11): qty 1

## 2020-01-14 MED ORDER — METOCLOPRAMIDE HCL 5 MG/ML IJ SOLN: 5.0000 mg | Freq: Three times a day (TID) | INTRAMUSCULAR | Status: DC | PRN

## 2020-01-14 MED ORDER — THIAMINE HCL 100 MG/ML IJ SOLN
100.0000 mg | Freq: Every day | INTRAMUSCULAR | Status: DC
  Administered 2020-01-17: 100 mg via INTRAVENOUS
  Filled 2020-01-14 (×2): qty 2

## 2020-01-14 MED ORDER — TRAZODONE HCL 50 MG PO TABS: 25.0000 mg | ORAL_TABLET | Freq: Every evening | ORAL | Status: DC | PRN

## 2020-01-14 MED ORDER — FENTANYL CITRATE (PF) 100 MCG/2ML IJ SOLN: 25.0000 ug | INTRAMUSCULAR | Status: DC | PRN

## 2020-01-14 MED ORDER — METHOCARBAMOL 500 MG PO TABS
500.0000 mg | ORAL_TABLET | Freq: Four times a day (QID) | ORAL | Status: DC | PRN
  Administered 2020-01-14 – 2020-01-16 (×3): 500 mg via ORAL
  Filled 2020-01-14 (×3): qty 1

## 2020-01-14 MED ORDER — ONDANSETRON HCL 4 MG/2ML IJ SOLN
4.0000 mg | Freq: Once | INTRAMUSCULAR | Status: AC
  Administered 2020-01-14: 4 mg via INTRAVENOUS
  Filled 2020-01-14: qty 2

## 2020-01-14 MED ORDER — BISACODYL 10 MG RE SUPP: 10.0000 mg | Freq: Every day | RECTAL | Status: DC | PRN

## 2020-01-14 MED ORDER — ESCITALOPRAM OXALATE 10 MG PO TABS
20.0000 mg | ORAL_TABLET | Freq: Every day | ORAL | Status: DC
  Administered 2020-01-15 – 2020-01-19 (×5): 20 mg via ORAL
  Filled 2020-01-14 (×5): qty 2

## 2020-01-14 MED ORDER — CEFAZOLIN SODIUM 1 G IJ SOLR
INTRAMUSCULAR | Status: AC
  Filled 2020-01-14: qty 40

## 2020-01-14 MED ORDER — ALUM & MAG HYDROXIDE-SIMETH 200-200-20 MG/5ML PO SUSP: 30.0000 mL | ORAL | Status: DC | PRN

## 2020-01-14 MED ORDER — EPHEDRINE SULFATE 50 MG/ML IJ SOLN
INTRAMUSCULAR | Status: DC | PRN
  Administered 2020-01-14: 10 mg via INTRAVENOUS

## 2020-01-14 MED ORDER — TRAMADOL HCL 50 MG PO TABS
50.0000 mg | ORAL_TABLET | Freq: Four times a day (QID) | ORAL | Status: DC
  Administered 2020-01-14 – 2020-01-19 (×16): 50 mg via ORAL
  Filled 2020-01-14 (×17): qty 1

## 2020-01-14 MED ORDER — FENTANYL CITRATE (PF) 100 MCG/2ML IJ SOLN
INTRAMUSCULAR | Status: DC | PRN
Start: 2020-01-14 — End: 2020-01-14
  Administered 2020-01-14: 50 ug via INTRAVENOUS

## 2020-01-14 MED ORDER — THIAMINE HCL 100 MG PO TABS
100.0000 mg | ORAL_TABLET | Freq: Every day | ORAL | Status: DC
  Administered 2020-01-15 – 2020-01-19 (×4): 100 mg via ORAL
  Filled 2020-01-14 (×4): qty 1

## 2020-01-14 MED ORDER — HYDROCODONE-ACETAMINOPHEN 5-325 MG PO TABS
1.0000 | ORAL_TABLET | ORAL | Status: DC | PRN
  Administered 2020-01-14: 1 via ORAL
  Filled 2020-01-14: qty 1

## 2020-01-14 MED ORDER — ONDANSETRON HCL 4 MG PO TABS: 4.0000 mg | ORAL_TABLET | Freq: Three times a day (TID) | ORAL | Status: DC | PRN

## 2020-01-14 MED ORDER — ONDANSETRON HCL 4 MG PO TABS: 4.0000 mg | ORAL_TABLET | Freq: Four times a day (QID) | ORAL | Status: DC | PRN

## 2020-01-14 MED ORDER — ENOXAPARIN SODIUM 40 MG/0.4ML ~~LOC~~ SOLN
40.0000 mg | SUBCUTANEOUS | Status: DC
  Administered 2020-01-15 – 2020-01-19 (×5): 40 mg via SUBCUTANEOUS
  Filled 2020-01-14 (×5): qty 0.4

## 2020-01-14 MED ORDER — MAGNESIUM CITRATE PO SOLN
1.0000 | Freq: Once | ORAL | Status: DC | PRN
  Filled 2020-01-14: qty 296

## 2020-01-14 MED ORDER — FOLIC ACID 1 MG PO TABS
1.0000 mg | ORAL_TABLET | Freq: Every day | ORAL | Status: DC
  Administered 2020-01-15 – 2020-01-19 (×5): 1 mg via ORAL
  Filled 2020-01-14 (×5): qty 1

## 2020-01-14 MED ORDER — MAGNESIUM HYDROXIDE 400 MG/5ML PO SUSP
30.0000 mL | Freq: Every day | ORAL | Status: DC | PRN
  Administered 2020-01-16 – 2020-01-18 (×3): 30 mL via ORAL
  Filled 2020-01-14 (×3): qty 30

## 2020-01-14 MED ORDER — OXYCODONE HCL 5 MG PO TABS
10.0000 mg | ORAL_TABLET | Freq: Four times a day (QID) | ORAL | Status: DC | PRN
  Administered 2020-01-15 – 2020-01-19 (×8): 10 mg via ORAL
  Filled 2020-01-14 (×8): qty 2

## 2020-01-14 MED ORDER — MIDAZOLAM HCL 2 MG/2ML IJ SOLN
INTRAMUSCULAR | Status: AC
  Filled 2020-01-14: qty 2

## 2020-01-14 MED ORDER — LORAZEPAM 2 MG PO TABS
0.0000 mg | ORAL_TABLET | Freq: Four times a day (QID) | ORAL | Status: AC
  Administered 2020-01-15: 4 mg via ORAL
  Administered 2020-01-15: 2 mg via ORAL
  Filled 2020-01-14 (×2): qty 2

## 2020-01-14 MED ORDER — KETAMINE HCL 50 MG/ML IJ SOLN
INTRAMUSCULAR | Status: AC
  Filled 2020-01-14: qty 10

## 2020-01-14 MED ORDER — LORAZEPAM 2 MG PO TABS: 0.0000 mg | ORAL_TABLET | Freq: Two times a day (BID) | ORAL | Status: DC

## 2020-01-14 MED ORDER — PHENOL 1.4 % MT LIQD
1.0000 | OROMUCOSAL | Status: DC | PRN
  Filled 2020-01-14: qty 177

## 2020-01-14 MED ORDER — PANTOPRAZOLE SODIUM 40 MG PO TBEC
40.0000 mg | DELAYED_RELEASE_TABLET | Freq: Two times a day (BID) | ORAL | Status: DC
  Administered 2020-01-14 – 2020-01-19 (×10): 40 mg via ORAL
  Filled 2020-01-14 (×10): qty 1

## 2020-01-14 MED ORDER — KETOROLAC TROMETHAMINE 30 MG/ML IJ SOLN
30.0000 mg | Freq: Once | INTRAMUSCULAR | Status: AC
  Administered 2020-01-14: 30 mg via INTRAVENOUS
  Filled 2020-01-14: qty 1

## 2020-01-14 MED ORDER — LORAZEPAM 2 MG/ML IJ SOLN: 0.0000 mg | Freq: Two times a day (BID) | INTRAMUSCULAR | Status: DC

## 2020-01-14 MED ORDER — LABETALOL HCL 5 MG/ML IV SOLN
INTRAVENOUS | Status: AC
  Filled 2020-01-14: qty 4

## 2020-01-14 MED ORDER — METHOCARBAMOL 1000 MG/10ML IJ SOLN
500.0000 mg | Freq: Four times a day (QID) | INTRAVENOUS | Status: DC | PRN
  Filled 2020-01-14: qty 5

## 2020-01-14 MED ORDER — SODIUM CHLORIDE FLUSH 0.9 % IV SOLN
INTRAVENOUS | Status: AC
  Filled 2020-01-14: qty 10

## 2020-01-14 MED ORDER — KETOROLAC TROMETHAMINE 30 MG/ML IJ SOLN
INTRAMUSCULAR | Status: AC
  Filled 2020-01-14: qty 1

## 2020-01-14 MED ORDER — MAGNESIUM HYDROXIDE 400 MG/5ML PO SUSP: 30.0000 mL | Freq: Every day | ORAL | Status: DC | PRN

## 2020-01-14 MED ORDER — PROPOFOL 500 MG/50ML IV EMUL
INTRAVENOUS | Status: DC | PRN
  Administered 2020-01-14: 125 ug/kg/min via INTRAVENOUS

## 2020-01-14 MED ORDER — CEFAZOLIN SODIUM-DEXTROSE 2-4 GM/100ML-% IV SOLN
2.0000 g | Freq: Once | INTRAVENOUS | Status: AC
  Administered 2020-01-14: 2 g via INTRAVENOUS
  Filled 2020-01-14: qty 100

## 2020-01-14 MED ORDER — KETAMINE HCL 50 MG/ML IJ SOLN
INTRAMUSCULAR | Status: DC | PRN
  Administered 2020-01-14 (×2): 20 mg via INTRAMUSCULAR

## 2020-01-14 MED ORDER — ENSURE ENLIVE PO LIQD
237.0000 mL | Freq: Two times a day (BID) | ORAL | Status: DC
  Administered 2020-01-15 – 2020-01-18 (×8): 237 mL via ORAL

## 2020-01-14 MED ORDER — FENTANYL CITRATE (PF) 100 MCG/2ML IJ SOLN
50.0000 ug | Freq: Once | INTRAMUSCULAR | Status: AC
  Administered 2020-01-14: 50 ug via INTRAVENOUS
  Filled 2020-01-14: qty 2

## 2020-01-14 MED ORDER — BUPIVACAINE IN DEXTROSE 0.75-8.25 % IT SOLN
INTRATHECAL | Status: DC | PRN
  Administered 2020-01-14: 2 mL via INTRATHECAL

## 2020-01-14 MED ORDER — NICOTINE 21 MG/24HR TD PT24
21.0000 mg | MEDICATED_PATCH | Freq: Every day | TRANSDERMAL | Status: DC
  Administered 2020-01-14 – 2020-01-19 (×6): 21 mg via TRANSDERMAL
  Filled 2020-01-14 (×6): qty 1

## 2020-01-14 MED ORDER — ACETAMINOPHEN 650 MG RE SUPP: 650.0000 mg | Freq: Four times a day (QID) | RECTAL | Status: DC | PRN

## 2020-01-14 MED ORDER — LORAZEPAM 2 MG/ML IJ SOLN
0.5000 mg | Freq: Once | INTRAMUSCULAR | Status: AC
  Administered 2020-01-14: 0.5 mg via INTRAVENOUS

## 2020-01-14 MED ORDER — PHENYLEPHRINE HCL (PRESSORS) 10 MG/ML IV SOLN
INTRAVENOUS | Status: DC | PRN
  Administered 2020-01-14: 100 ug via INTRAVENOUS

## 2020-01-14 SURGICAL SUPPLY — 33 items
BIT DRILL 4.3MMS DISTAL GRDTED (BIT) ×1 IMPLANT
CANISTER SUCT 1200ML W/VALVE (MISCELLANEOUS) ×2 IMPLANT
CHLORAPREP W/TINT 26 (MISCELLANEOUS) ×2 IMPLANT
COVER WAND RF STERILE (DRAPES) ×2 IMPLANT
DRAPE 3/4 80X56 (DRAPES) ×2 IMPLANT
DRAPE U-SHAPE 47X51 STRL (DRAPES) ×2 IMPLANT
DRILL 4.3MMS DISTAL GRADUATED (BIT) ×2
DRSG OPSITE POSTOP 3X4 (GAUZE/BANDAGES/DRESSINGS) ×6 IMPLANT
GLOVE BIOGEL PI IND STRL 9 (GLOVE) ×1 IMPLANT
GLOVE BIOGEL PI INDICATOR 9 (GLOVE) ×1
GLOVE SURG SYN 9.0  PF PI (GLOVE) ×1
GLOVE SURG SYN 9.0 PF PI (GLOVE) ×1 IMPLANT
GOWN SRG 2XL LVL 4 RGLN SLV (GOWNS) ×1 IMPLANT
GOWN STRL NON-REIN 2XL LVL4 (GOWNS) ×1
GOWN STRL REUS W/ TWL LRG LVL3 (GOWN DISPOSABLE) ×1 IMPLANT
GOWN STRL REUS W/TWL LRG LVL3 (GOWN DISPOSABLE) ×1
GUIDEWIRE BALL NOSE 100CM (WIRE) ×2 IMPLANT
HIP FRAC NAIL LAG SCR 10.5X100 (Orthopedic Implant) ×1 IMPLANT
KIT TURNOVER KIT A (KITS) ×2 IMPLANT
MAT ABSORB  FLUID 56X50 GRAY (MISCELLANEOUS) ×1
MAT ABSORB FLUID 56X50 GRAY (MISCELLANEOUS) ×1 IMPLANT
NAIL HIP FRA AFFIX 130X9X380 L (Nail) ×2 IMPLANT
NEEDLE FILTER BLUNT 18X 1/2SAF (NEEDLE) ×1
NEEDLE FILTER BLUNT 18X1 1/2 (NEEDLE) ×1 IMPLANT
NS IRRIG 500ML POUR BTL (IV SOLUTION) ×2 IMPLANT
PACK HIP COMPR (MISCELLANEOUS) ×2 IMPLANT
SCALPEL PROTECTED #15 DISP (BLADE) ×4 IMPLANT
SCREW BONE CORTICAL 5.0X44 (Screw) ×2 IMPLANT
SCREW CANN THRD AFF 10.5X100 (Orthopedic Implant) ×1 IMPLANT
STAPLER SKIN PROX 35W (STAPLE) ×2 IMPLANT
SUT VIC AB 1 CT1 36 (SUTURE) ×2 IMPLANT
SUT VIC AB 2-0 CT1 (SUTURE) ×2 IMPLANT
SYR 10ML LL (SYRINGE) ×2 IMPLANT

## 2020-01-14 NOTE — Progress Notes (Signed)
All available pain medication given and pt still complaining of 9/10 pain. Manuela Schwartz NP notified. NP to place orders.
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
## 2020-01-14 NOTE — ED Notes (Signed)
Pt changed into hospital gown and socks. Belongings placed in pt belongings bag.

## 2020-01-14 NOTE — ED Notes (Signed)
Spoke with OR - plan is for 1200 surgery

## 2020-01-14 NOTE — Transfer of Care (Signed)
Immediate Anesthesia Transfer of Care Note  Patient: Chad [name]  Procedure(s) Performed: INTRAMEDULLARY (IM) NAIL INTERTROCHANTRIC (Left )  Patient Location: PACU  Anesthesia Type:MAC and Spinal  Level of Consciousness: drowsy and patient cooperative  Airway & Oxygen Therapy: Patient Spontanous Breathing and Patient connected to nasal cannula oxygen  Post-op Assessment: Report given to RN and Post -op Vital signs reviewed and stable  Post vital signs: Reviewed and stable  Last Vitals:  Vitals Value Taken Time  BP 120/65 1512  Temp  36  Pulse 60 1512  Resp 14 9  SpO2 100 100    Last Pain: 0 Vitals:   01/14/20 0244  TempSrc:   PainSc: 7          Complications: No complications documented.

## 2020-01-14 NOTE — Op Note (Signed)
01/14/2020  3:12 PM  PATIENT:  Chad [name]  69 y.o. male  PRE-OPERATIVE DIAGNOSIS:  Hip FX left intertrochanteric  POST-OPERATIVE DIAGNOSIS:  Hip FX left intertrochanteric  PROCEDURE:  Procedure(s): INTRAMEDULLARY (IM) NAIL INTERTROCHANTRIC (Left)  SURGEON: Leitha Schuller, MD  ASSISTANTS: None  ANESTHESIA:   spinal  EBL:  Total I/O In: 1000 [I.V.:1000] Out: 475 [Urine:400; Blood:75]  BLOOD ADMINISTERED:none  DRAINS: none   LOCAL MEDICATIONS USED:  NONE  SPECIMEN:  No Specimen  DISPOSITION OF SPECIMEN:  N/A  COUNTS:  YES  TOURNIQUET:  * No tourniquets in log *  IMPLANTS: Biomet affixes 9 x 380 left with 100 mm leg screw 44 mm distal interlocking screw  DICTATION: .Dragon Dictation patient was brought to the operating room and after adequate spinal anesthesia was obtained is placed on the fracture table with the right leg in the well-leg holder left leg in the traction boot with minimal traction applied in slight internal rotation.  C arm was brought in and good visualization in both AP and lateral projections showing the fracture in essentially anatomic alignment.  After prepping and draping using a barrier drape method appropriate patient identification and timeout procedures were completed.  Incision was made proximally and a guidewire inserted into the tip of the trochanter followed by proximal reaming and placing the long guidewire.  Measurement was made off this for rod length determination.  Remove the 11 mm gave some chatter so the 9 x 380 rod was inserted to the appropriate depth.  The drill sleeve for the leg screw was inserted with a small incision made laterally in the drill sleeve brought down to the bone.  Guidewire was inserted into a near center center position measurement made reaming and then the 100 mm leg screw inserted.  Proximal locking screw was tightened with a quarter turn to allow for potential compression.  The insertion handle was removed with  permanent C-arm views obtained.  Going distally the distal oblique screw hole was identified a small incision made drilling carried out measuring and then the 44 mm screw placed.  The wounds were irrigated and then closed with #1 Vicryl for the deep fascia 2-0 Vicryl subcutaneously and skin staples.  Xeroform and honeycomb dressings applied.  PLAN OF CARE: Admit to inpatient   PATIENT DISPOSITION:  PACU - hemodynamically stable.

## 2020-01-14 NOTE — Anesthesia Preprocedure Evaluation (Signed)
Anesthesia Evaluation  Patient identified by MRN, date of birth, ID band Patient awake  General Assessment Comment:Agitated, says he is in alcohol withdrawal  Reviewed: Allergy & Precautions, H&P , NPO status , Patient's Chart, lab work & pertinent test results  History of Anesthesia Complications Negative for: history of anesthetic complications  Airway Mallampati: II  TM Distance: >3 FB Neck ROM: full    Dental  (+) Poor Dentition, Missing   Pulmonary neg shortness of breath, neg sleep apnea, neg COPD, neg recent URI, Current Smoker and Patient abstained from smoking.,    Pulmonary exam normal breath sounds clear to auscultation       Cardiovascular Exercise Tolerance: Good METShypertension, On Medications (-) angina(-) CAD and (-) Past MI Normal cardiovascular exam(-) dysrhythmias  Rhythm:regular Rate:Normal - Systolic murmurs    Neuro/Psych  Headaches, Seizures -,  PSYCHIATRIC DISORDERS Anxiety Depression Alcohol withdrawal seizures    GI/Hepatic GERD  ,(+)     substance abuse  alcohol use, Hx of alcohol withdrawal, says he has had seizures before from it Last drink 3 days ago Concern for variceal / UGI bleed   Endo/Other  negative endocrine ROSneg diabetes  Renal/GU CRFRenal disease     Musculoskeletal   Abdominal   Peds  Hematology negative hematology ROS (+)   Anesthesia Other Findings Past Medical History: No date: Anxiety     Comment:  takes lexapro No date: Chronic kidney disease     Comment:  patient states he was told he has 'moderate kidney               disease' No date: Degenerative disc disease, lumbar No date: Exposure to hepatitis C No date: GERD (gastroesophageal reflux disease)     Comment:  takes OTC as needed - omeprazole No date: Headache     Comment:  on occasion No date: History of gallstones No date: Hypertension     Comment:  diet controlled approx 15 years ago: Pneumonia   Reproductive/Obstetrics negative OB ROS                             Anesthesia Physical  Anesthesia Plan  ASA: III  Anesthesia Plan: Spinal   Post-op Pain Management:    Induction: Intravenous  PONV Risk Score and Plan: 2 and Propofol infusion and TIVA  Airway Management Planned: Natural Airway and Nasal Cannula  Additional Equipment: None  Intra-op Plan:   Post-operative Plan: Extubation in OR  Informed Consent: I have reviewed the patients History and Physical, chart, labs and discussed the procedure including the risks, benefits and alternatives for the proposed anesthesia with the patient or authorized representative who has indicated his/her understanding and acceptance.     Dental Advisory Given  Plan Discussed with: Anesthesiologist, CRNA and Surgeon  Anesthesia Plan Comments: ( Hematemesis and melena 3 days ago. No vomiting since then while admitted, no hematemesis. Hgb has been stable.   Discussed risks of anesthesia with patient, including possibility of difficulty with spontaneous ventilation under anesthesia necessitating airway intervention, aspiration, PONV, and rare risks such as cardiac or respiratory or neurological events. Patient understands.)        Anesthesia Quick Evaluation

## 2020-01-14 NOTE — Anesthesia Procedure Notes (Signed)
Spinal  Patient location during procedure: OR Start time: 01/14/2020 2:05 PM End time: 01/14/2020 2:15 PM Staffing Performed: resident/CRNA  Resident/CRNA: Chayce Rullo L, CRNA Preanesthetic Checklist Completed: patient identified, IV checked, site marked, risks and benefits discussed, surgical consent, monitors and equipment checked, pre-op evaluation and timeout performed Spinal Block Patient position: left lateral decubitus Prep: ChloraPrep Patient monitoring: heart rate, continuous pulse ox and blood pressure Approach: midline Location: L2-3 Injection technique: single-shot Needle Needle type: Whitacre and Introducer  Needle gauge: 24 G Needle length: 9 cm Assessment Sensory level: T6 Additional Notes Negative paresthesia. Negative blood return. Positive free-flowing CSF. Expiration date of kit checked and confirmed. Patient tolerated procedure well, without complications.
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
## 2020-01-15 LAB — BASIC METABOLIC PANEL
Anion gap: 7 (ref 5–15)
BUN: 8 mg/dL (ref 8–23)
CO2: 27 mmol/L (ref 22–32)
Calcium: 8.2 mg/dL — ABNORMAL LOW (ref 8.9–10.3)
Chloride: 101 mmol/L (ref 98–111)
Creatinine, Ser: 0.56 mg/dL — ABNORMAL LOW (ref 0.61–1.24)
GFR calc Af Amer: >60 mL/min (ref >60)
GFR calc non Af Amer: >60 mL/min (ref >60)
Glucose, Bld: 105 mg/dL — ABNORMAL HIGH (ref 70–99)
Potassium: 3.8 mmol/L (ref 3.5–5.1)
Sodium: 135 mmol/L (ref 135–145)

## 2020-01-15 LAB — CBC
HCT: 32.6 % — ABNORMAL LOW (ref 39.0–52.0)
Hemoglobin: 10.8 g/dL — ABNORMAL LOW (ref 13.0–17.0)
MCH: 30.7 pg (ref 26.0–34.0)
MCHC: 33.1 g/dL (ref 30.0–36.0)
MCV: 92.6 fL (ref 80.0–100.0)
Platelets: 244 10*3/uL (ref 150–400)
RBC: 3.52 MIL/uL — ABNORMAL LOW (ref 4.22–5.81)
RDW: 14.6 % (ref 11.5–15.5)
WBC: 10.2 10*3/uL (ref 4.0–10.5)
nRBC: 0 % (ref 0.0–0.2)

## 2020-01-15 NOTE — Progress Notes (Signed)
Physical Therapy Treatment Patient Details Name: Chad [name] MRN: [id number] DOB: 1950/12/11 Today's Date: 01/15/2020    History of Present Illness Per MD note:Chad [name]  is a 69 y.o. Caucasian male with a known history of hypertension, anxiety and depression, as well as ongoing alcohol abuse, who presented to the emergency room with acute onset left hip pain after having an accidental mechanical fall.  He was going to the bathroom apparently lost balance when he fell on his left side.  Denies any presyncope or syncope headache dizziness or blurred vision, chest pain or dyspnea or palpitations.  No paresthesias or focal muscle weakness.  No nausea or vomiting or abdominal pain.  No dysuria, oliguria or hematuria or flank pain.  His last alcoholic drink was at 2 PM.  He usually drinks 2-3 mixed drinks.  He was noted to be shaking the emergency room.    PT Comments    Waking by pt room,  Door closed, Pt yelling out for help.  Pt in bed with bed raised, alarm off and rails down.  IV line under his low back. Pt appears to have transferred himself out of recliner and back to bed on his own prior to arrival.   Pt stated he is trying to get to "the latrine."  Bed saturated with urine.  Called for assist.  Pt self initiated sitting R side of bed.  Assisted for safety while staff called.  Nursing in.  Assisted with transfer to recliner for bed change out to low bed and then back to bed per his request.  Care provided as appropriate and pt left in nursing care.  Stressed importance of calling for staff assist and using call bed.  Voiced understanding.     Follow Up Recommendations  SNF     Equipment Recommendations  Rolling walker with 5" wheels    Recommendations for Other Services       Precautions / Restrictions Precautions Precautions: Fall Restrictions Weight Bearing Restrictions: Yes LLE Weight Bearing: Weight bearing as tolerated Other Position/Activity Restrictions: L LE WBAT     Mobility  Bed Mobility Overal bed mobility: Needs Assistance Bed Mobility: Supine to Sit;Sit to Supine     Supine to sit: Mod assist Sit to supine: Mod assist;+2 for physical assistance      Transfers Overall transfer level: Needs assistance Equipment used: Rolling walker (2 wheeled) Transfers: Sit to/from Stand Sit to Stand: Min assist;+2 physical assistance         General transfer comment: Min guard for functional mobility. Cueing required for safety/sequencing. sitting down without reaching , and to slow down  Ambulation/Gait Ambulation/Gait assistance: Min assist;+2 physical assistance Gait Distance (Feet): 3 Feet Assistive device: Rolling walker (2 wheeled) Gait Pattern/deviations: Step-to pattern     General Gait Details: tremors and pain limiting mobility   Stairs             Wheelchair Mobility    Modified Rankin (Stroke Patients Only)       Balance Overall balance assessment: Needs assistance Sitting-balance support: Feet supported Sitting balance-Leahy Scale: Poor Sitting balance - Comments: forward lean and tremors   Standing balance support: During functional activity;Single extremity supported;Bilateral upper extremity supported Standing balance-Leahy Scale: Poor Standing balance comment: heavy use of RW limited by pain                            Cognition Arousal/Alertness: Awake/alert Behavior During Therapy: Impulsive Overall Cognitive Status: Within  Functional Limits for tasks assessed                                 General Comments: Able to follow instructions- but safety - impulsive with sitting down - not reaching out      Exercises      General Comments        Pertinent Vitals/Pain Pain Assessment: 0-10 Pain Score: 8  Faces Pain Scale: Hurts whole lot Pain Location: L hip Pain Descriptors / Indicators: Grimacing;Guarding;Constant;Sore Pain Intervention(s): RN gave pain meds during  session;Repositioned    Home Living Family/patient expects to be discharged to:: Private residence Living Arrangements: Alone Available Help at Discharge: Friend(s) Type of Home: Apartment Home Access: Stairs to enter Entrance Stairs-Rails: Right Home Layout: One level Home Equipment: Cane - single point      Prior Function Level of Independence: Independent with assistive device(s)      Comments: Pt independent for bathing, dressing, and BADL management. He uses a [facility] for functional mobility. +drives; endorses multiple (at least 4) falls in last 6 months.   PT Goals (current goals can now be found in the care plan section) Acute Rehab PT Goals Patient Stated Goal: be able to walk better PT Goal Formulation: With patient Time For Goal Achievement: 01/29/20 Potential to Achieve Goals: Fair Progress towards PT goals: Progressing toward goals    Frequency    BID      PT Plan Current plan remains appropriate    Co-evaluation              AM-PAC PT "6 Clicks" Mobility   Outcome Measure  Help needed turning from your back to your side while in a flat bed without using bedrails?: A Lot Help needed moving from lying on your back to sitting on the side of a flat bed without using bedrails?: A Lot Help needed moving to and from a bed to a chair (including a wheelchair)?: A Lot Help needed standing up from a chair using your arms (e.g., wheelchair or bedside chair)?: A Lot Help needed to walk in hospital room?: A Lot Help needed climbing 3-5 steps with a railing? : Total 6 Click Score: 11    End of Session Equipment Utilized During Treatment: Gait belt Activity Tolerance: Patient limited by pain;Treatment limited secondary to medical complications (Comment) Patient left: in bed;with call bell/phone within reach;with bed alarm set Nurse Communication: Mobility status PT Visit Diagnosis: Other abnormalities of gait and mobility (R26.89);Muscle weakness (generalized)  (M62.81);Difficulty in walking, not elsewhere classified (R26.2)     Time: 8341-9622 PT Time Calculation (min) (ACUTE ONLY): 16 min  Charges:  $Gait Training: 8-22 mins $Therapeutic Activity: 8-22 mins                    [name], PTA 01/15/20, 12:39 PM

## 2020-01-15 NOTE — Progress Notes (Signed)
Physical Therapy Evaluation Patient Details Name: Chad [name] MRN: [id number] DOB: [birth year] Today's Date: 01/15/2020   History of Present Illness  Per MD note:Chad [name]  is a 69 y.o. Caucasian male with a known history of hypertension, anxiety and depression, as well as ongoing alcohol abuse, who presented to the emergency room with acute onset left hip pain after having an accidental mechanical fall.  He was going to the bathroom apparently lost balance when he fell on his left side.  Denies any presyncope or syncope headache dizziness or blurred vision, chest pain or dyspnea or palpitations.  No paresthesias or focal muscle weakness.  No nausea or vomiting or abdominal pain.  No dysuria, oliguria or hematuria or flank pain.  His last alcoholic drink was at 2 PM.  He usually drinks 2-3 mixed drinks.  He was noted to be shaking the emergency room.  Clinical Impression  Patient agrees to PT eval. He has -2/5 BLE hip flex and abd and 3/5 knee extension strength. He has good sitting balance and fair standing balance with UE support with RW. He is mod with bed mobility, mod assist for transfers sit to stand with RW. He ambulates with RW 10 feet with mod assist. He has fatigue following gait. Patient will continue to benefit from skilled PT to improve mobility and strength.    Follow Up Recommendations SNF    Equipment Recommendations  Rolling walker with 5" wheels    Recommendations for Other Services       Precautions / Restrictions Restrictions Weight Bearing Restrictions: Yes      Mobility  Bed Mobility Overal bed mobility: Needs Assistance Bed Mobility: Supine to Sit;Sit to Supine     Supine to sit: Mod assist Sit to supine: Mod assist      Transfers Overall transfer level: Needs assistance Equipment used: Rolling walker (2 wheeled) Transfers: Sit to/from Stand Sit to Stand: Mod assist            Ambulation/Gait Ambulation/Gait assistance: Mod assist Gait  Distance (Feet): 10 Feet Assistive device: Rolling walker (2 wheeled) Gait Pattern/deviations: Step-to pattern        Stairs            Wheelchair Mobility    Modified Rankin (Stroke Patients Only)       Balance Overall balance assessment: Needs assistance Sitting-balance support: Bilateral upper extremity supported Sitting balance-Leahy Scale: Good       Standing balance-Leahy Scale: Fair Standing balance comment: RW                             Pertinent Vitals/Pain Pain Assessment: Faces Faces Pain Scale: Hurts whole lot Pain Location:  (L hip)    Home Living Family/patient expects to be discharged to:: Private residence Living Arrangements: Alone Available Help at Discharge: Friend(s)   Home Access: Stairs to enter Entrance Stairs-Rails: Right Entrance Stairs-Number of Steps: 13          Prior Function Level of Independence: Independent with assistive device(s)               Hand Dominance        Extremity/Trunk Assessment   Upper Extremity Assessment Upper Extremity Assessment: Overall WFL for tasks assessed    Lower Extremity Assessment Lower Extremity Assessment: LLE deficits/detail LLE Deficits / Details: -2/5 hip flex and abd       Communication   Communication: No difficulties  Cognition Arousal/Alertness: Awake/alert Behavior During Therapy:  WFL for tasks assessed/performed                                          General Comments      Exercises     Assessment/Plan    PT Assessment Patient needs continued PT services  PT Problem List Decreased strength;Decreased balance;Decreased mobility       PT Treatment Interventions Gait training;Therapeutic activities;Therapeutic exercise;Balance training    PT Goals (Current goals can be found in the Care Plan section)  Acute Rehab PT Goals Patient Stated Goal: to walk PT Goal Formulation: With patient Time For Goal Achievement:  01/29/20 Potential to Achieve Goals: Fair    Frequency BID   Barriers to discharge        Co-evaluation               AM-PAC PT "6 Clicks" Mobility  Outcome Measure Help needed turning from your back to your side while in a flat bed without using bedrails?: A Lot Help needed moving from lying on your back to sitting on the side of a flat bed without using bedrails?: A Lot Help needed moving to and from a bed to a chair (including a wheelchair)?: A Lot Help needed standing up from a chair using your arms (e.g., wheelchair or bedside chair)?: A Lot Help needed to walk in hospital room?: A Lot Help needed climbing 3-5 steps with a railing? : Total 6 Click Score: 11    End of Session Equipment Utilized During Treatment: Gait belt Activity Tolerance: No increased pain Patient left: in chair;with chair alarm set Nurse Communication: Mobility status PT Visit Diagnosis: Other abnormalities of gait and mobility (R26.89);Muscle weakness (generalized) (M62.81);Difficulty in walking, not elsewhere classified (R26.2)    Time: 9794-8016 PT Time Calculation (min) (ACUTE ONLY): 25 min   Charges:   PT Evaluation $PT Eval Low Complexity: 1 Low PT Treatments $Gait Training: 8-22 mins          Ezekiel Ina, PT DPT 01/15/2020, 11:39 AM

## 2020-01-15 NOTE — Progress Notes (Signed)
Report given to [facility] Rn she will resume care this shift.

## 2020-01-15 NOTE — Plan of Care (Signed)
m °

## 2020-01-15 NOTE — Progress Notes (Signed)
Toradol was given with some relieve patient resting comfortably.

## 2020-01-15 NOTE — Progress Notes (Signed)
   Subjective: 1 Day Post-Op Procedure(s) (LRB): INTRAMEDULLARY (IM) NAIL INTERTROCHANTRIC (Left) Patient reports pain as moderate.   Patient is well, and has had no acute complaints or problems Denies any CP, SOB, ABD pain. We will start physical therapy today.   Objective: Vital signs in last 24 hours: Temp:  [97.1 F (36.2 C)-98.8 F (37.1 C)] 98.1 F (36.7 C) (09/26 0823) Pulse Rate:  [48-107] 86 (09/26 0823) Resp:  [10-20] 17 (09/26 0823) BP: (101-151)/(64-99) 144/96 (09/26 0823) SpO2:  [60 %-100 %] 97 % (09/26 0823)  Intake/Output from previous day: 09/25 0701 - 09/26 0700 In: 1948.8 [P.O.:530; I.V.:1218.8; IV Piggyback:200] Out: 1725 [Urine:1650; Blood:75] Intake/Output this shift: Total I/O In: -  Out: 300 [Urine:300]  Recent Labs    01/13/20 2118 01/14/20 0415 01/15/20 0425  HGB 13.8 11.8* 10.8*   Recent Labs    01/14/20 0415 01/15/20 0425  WBC 9.0 10.2  RBC 3.88* 3.52*  HCT 34.8* 32.6*  PLT 299 244   Recent Labs    01/14/20 0415 01/15/20 0425  NA 141 135  K 4.1 3.8  CL 103 101  CO2 28 27  BUN 11 8  CREATININE 0.73 0.56*  GLUCOSE 86 105*  CALCIUM 8.6* 8.2*   Recent Labs    01/14/20 0146  INR 1.1    EXAM General - Patient is Alert, Appropriate and Oriented Extremity - Neurovascular intact Sensation intact distally Intact pulses distally Dorsiflexion/Plantar flexion intact No cellulitis present Compartment soft Dressing - dressing C/D/I and no drainage Motor Function - intact, moving foot and toes well on exam.   Past Medical History:  Diagnosis Date  . Anxiety    takes lexapro  . Chronic kidney disease    patient states he was told he has 'moderate kidney disease'  . Degenerative disc disease, lumbar   . Exposure to hepatitis C   . GERD (gastroesophageal reflux disease)    takes OTC as needed - omeprazole  . Headache    on occasion  . History of gallstones   . Hypertension    diet controlled  . Pneumonia approx 15 years  ago    Assessment/Plan:   1 Day Post-Op Procedure(s) (LRB): INTRAMEDULLARY (IM) NAIL INTERTROCHANTRIC (Left) Active Problems:   Closed left hip fracture (HCC)  Estimated body mass index is 23.11 kg/m as calculated from the following:   Height as of this encounter: 5\' 9"  (1.753 m).   Weight as of this encounter: 71 kg. Advance diet Up with therapy  Needs bowel movement Labs and vital signs are stable Pain well controlled Care management to assist with discharge  DVT Prophylaxis - Lovenox, Foot Pumps and TED hose Weight-Bearing as tolerated to left leg   T. , PA-C [facility] 01/15/2020, 10:32 AM

## 2020-01-15 NOTE — Progress Notes (Signed)
PROGRESS NOTE    Chad [name]  ERD:[id number] DOB: Jul 15, 1950 DOA: 01/14/2020 PCP: Patient, No Pcp Per   Brief Narrative:  Chad [name]  is a 69 y.o. Caucasian male with a known history of hypertension, anxiety and depression, as well as ongoing alcohol abuse, who presented to the emergency room with acute onset left hip pain after having an accidental mechanical fall.  He was going to the bathroom apparently lost balance when he fell on his left side.  Denies any presyncope or syncope headache dizziness or blurred vision, chest pain or dyspnea or palpitations.  No paresthesias or focal muscle weakness.  No nausea or vomiting or abdominal pain.  No dysuria, oliguria or hematuria or flank pain.  His last alcoholic drink was at 2 PM.  He usually drinks 2-3 mixed drinks.  He was noted to be shaking the emergency room.  Upon presentation to the emergency room, temperature was 96.6 with otherwise normal vital signs.  Labs revealed unremarkable BMP and CBC.  INR was 1.1.  Noncontrasted CT scan revealed no acute intracranial normalities.EKG showed normal sinus rhythm with a rate of 65 with poor R wave progression and low voltage QRS with T wave inversion in V1 and V2.  Chest x-ray showed shallow inspiration with no acute cardiopulmonary disease.  Left hip x-ray showed nondisplaced intertrochanteric left hip fracture.  The patient was given 50 mcg of IV fentanyl and 4 mg IV Zofran.  He was placed on CIWA protocol.  He will be admitted to a medical monitored bed for further evaluation and management.  Assessment & Plan:   Active Problems:   Closed left hip fracture (HCC) Pt is s/p intramedullary nail per orthopedic. Pain management prn. Stable no c/o.  Anemia: Attribute to operative blood loss.  Alcohol abuse: Cont CIWA protocol. Thiamine daily.   DVT prophylaxis: lovenox Code Status: full Family Communication: none at bedside. Disposition Plan: STR.  Status is: Inpatient  Remains  inpatient appropriate because:Inpatient level of care appropriate due to severity of illness   Dispo: The patient is from: Home              Anticipated d/c is to: Home              Anticipated d/c date is: 3 days              Patient currently is not medically stable to d/c.  Consultants:   Orthopedic: Hip FX left intertrochanteric  Procedures:  Hip FX left intertrochanteric.  Antimicrobials:  Anti-infectives (From admission, onward)   Start     Dose/Rate Route Frequency Ordered Stop   01/14/20 2000  ceFAZolin (ANCEF) IVPB 2g/100 mL premix        2 g 200 mL/hr over 30 Minutes Intravenous Every 6 hours 01/14/20 1748 01/15/20 0847   01/14/20 1100  ceFAZolin (ANCEF) IVPB 2g/100 mL premix        2 g 200 mL/hr over 30 Minutes Intravenous  Once 01/14/20 0735 01/14/20 1424     Subjective: Pt is alert and denies any complaints.  vitals and labs are stable.  Objective: Vitals:   01/14/20 2349 01/15/20 0348 01/15/20 0823 01/15/20 1224  BP: (!) 132/98 114/72 (!) 144/96 135/78  Pulse: 99 88 86 82  Resp: 16 17 17 19   Temp: 98.2 F (36.8 C) 98.2 F (36.8 C) 98.1 F (36.7 C) 98.4 F (36.9 C)  TempSrc: Oral Oral Oral Oral  SpO2: 97% 98% 97% 98%  Weight:  Height:        Intake/Output Summary (Last 24 hours) at 01/15/2020 1444 Last data filed at 01/15/2020 0819 Gross per 24 hour  Intake 1948.76 ml  Output 1625 ml  Net 323.76 ml   Filed Weights   01/13/20 2104  Weight: 71 kg    Examination:  General exam: Appears calm and comfortable  Respiratory system: Clear to auscultation. Respiratory effort normal. Cardiovascular system: S1 & S2 heard, RRR. No JVD, murmurs, rubs, gallops or clicks. No pedal edema. Gastrointestinal system: Abdomen is nondistended, soft and nontender. No organomegaly or masses felt. Normal bowel sounds heard. Central nervous system: Alert and oriented. No focal neurological deficits. Extremities: pt moving all ext and pulses intact.  Skin: No  rashes, lesions or ulcers Psychiatry: Judgement and insight appear normal. Mood & affect appropriate.   Data Reviewed: I have personally reviewed following labs and imaging studies  I/O last 3 completed shifts: In: 1948.8 [P.O.:530; I.V.:1218.8; IV Piggyback:200] Out: 1725 [Urine:1650; Blood:75] Total I/O In: -  Out: 300 [Urine:300] Lab Results  Component Value Date   CREATININE 0.56 (L) 01/15/2020   CREATININE 0.73 01/14/2020   CREATININE 0.74 01/13/2020   CBC: Recent Labs  Lab 01/13/20 2118 01/14/20 0415 01/15/20 0425  WBC 7.1 9.0 10.2  NEUTROABS 4.1  --   --   HGB 13.8 11.8* 10.8*  HCT 39.5 34.8* 32.6*  MCV 90.0 89.7 92.6  PLT 340 299 244   Basic Metabolic Panel: Recent Labs  Lab 01/13/20 2118 01/14/20 0415 01/15/20 0425  NA 141 141 135  K 4.0 4.1 3.8  CL 104 103 101  CO2 24 28 27   GLUCOSE 107* 86 105*  BUN 9 11 8   CREATININE 0.74 0.73 0.56*  CALCIUM 9.0 8.6* 8.2*  Coagulation Profile: Recent Labs  Lab 01/14/20 0146  INR 1.1    Recent Results (from the past 240 hour(s))  Respiratory Panel by RT PCR (Flu A&B, Covid) - Nasopharyngeal Swab     Status: None   Collection Time: 01/14/20  2:52 AM   Specimen: Nasopharyngeal Swab  Result Value Ref Range Status   SARS Coronavirus 2 by RT PCR NEGATIVE NEGATIVE Final    Comment: (NOTE) SARS-CoV-2 target nucleic acids are NOT DETECTED.  The SARS-CoV-2 RNA is generally detectable in upper respiratoy specimens during the acute phase of infection. The lowest concentration of SARS-CoV-2 viral copies this assay can detect is 131 copies/mL. A negative result does not preclude SARS-Cov-2 infection and should not be used as the sole basis for treatment or other patient management decisions. A negative result may occur with  improper specimen collection/handling, submission of specimen other than nasopharyngeal swab, presence of viral mutation(s) within the areas targeted by this assay, and inadequate number of viral  copies (<131 copies/mL). A negative result must be combined with clinical observations, patient history, and epidemiological information. The expected result is Negative.  Fact Sheet for Patients:  https://www.moore.com/https://www.fda.gov/media/142436/download  Fact Sheet for Healthcare Providers:  https://www.young.biz/https://www.fda.gov/media/142435/download  This test is no t yet approved or cleared by the [place] FDA and  has been authorized for detection and/or diagnosis of SARS-CoV-2 by FDA under an Emergency Use Authorization (EUA). This EUA will remain  in effect (meaning this test can be used) for the duration of the COVID-19 declaration under Section 564(b)(1) of the Act, 21 U.S.C. section 360bbb-3(b)(1), unless the authorization is terminated or revoked sooner.     Influenza A by PCR NEGATIVE NEGATIVE Final   Influenza B by PCR NEGATIVE NEGATIVE  Final    Comment: (NOTE) The Xpert Xpress SARS-CoV-2/FLU/RSV assay is intended as an aid in  the diagnosis of influenza from Nasopharyngeal swab specimens and  should not be used as a sole basis for treatment. Nasal washings and  aspirates are unacceptable for Xpert Xpress SARS-CoV-2/FLU/RSV  testing.  Fact Sheet for Patients: https://www.moore.com/  Fact Sheet for Healthcare Providers: https://www.young.biz/  This test is not yet approved or cleared by the [place] FDA and  has been authorized for detection and/or diagnosis of SARS-CoV-2 by  FDA under an Emergency Use Authorization (EUA). This EUA will remain  in effect (meaning this test can be used) for the duration of the  Covid-19 declaration under Section 564(b)(1) of the Act, 21  U.S.C. section 360bbb-3(b)(1), unless the authorization is  terminated or revoked. Performed at [facility], [address], [place], [place] [address]      Radiology Studies: DG Chest 1 View  Result Date: 01/14/2020 CLINICAL DATA:  Preoperative for left hip  fracture after a fall EXAM: CHEST  1 VIEW COMPARISON:  12/31/2019 FINDINGS: Shallow inspiration. Heart size and pulmonary vascularity are normal. Lungs appear clear and expanded. Thin linear stripe projected over the right upper lung appears to be artifact. No definite pneumothorax. No pleural effusions. Visualized bones appear intact. IMPRESSION: Shallow inspiration. No evidence of active pulmonary disease. Electronically Signed   By: Burman Nieves M.D.   On: 01/14/2020 02:10   CT Head Wo Contrast  Result Date: 01/14/2020 CLINICAL DATA:  Head injury and left hip injury after a fall. Brief loss of consciousness. EXAM: CT HEAD WITHOUT CONTRAST TECHNIQUE: Contiguous axial images were obtained from the base of the skull through the vertex without intravenous contrast. COMPARISON:  None. FINDINGS: Brain: No evidence of acute infarction, hemorrhage, hydrocephalus, extra-axial collection or mass lesion/mass effect. Diffuse cerebral atrophy. Ventricular dilatation consistent with central atrophy. Vascular: No hyperdense vessel or unexpected calcification. Skull: Normal. Negative for fracture or focal lesion. Sinuses/Orbits: No acute finding. Other: None. IMPRESSION: 1. No acute intracranial abnormalities. 2. Diffuse cerebral atrophy. Electronically Signed   By: Burman Nieves M.D.   On: 01/14/2020 03:00   DG C-Arm 1-60 Min  Result Date: 01/14/2020 CLINICAL DATA:  Left-sided intramedullary nail placement EXAM: DG C-ARM 1-60 MIN FLUOROSCOPY TIME:  Fluoroscopy Time:  44 seconds Number of Acquired Spot Images: 5 COMPARISON:  01/13/2020 FINDINGS: The patient has undergone intramedullary nail placement on the left. The hardware appears intact. The alignment is improved. There are expected postsurgical changes. IMPRESSION: Status post intramedullary nail placement on the left. Electronically Signed   By: Katherine Mantle M.D.   On: 01/14/2020 15:24   DG Hip Unilat With Pelvis 2-3 Views Left  Result Date:  01/13/2020 CLINICAL DATA:  Larey Seat, left hip injury EXAM: DG HIP (WITH OR WITHOUT PELVIS) 2-3V LEFT COMPARISON:  None. FINDINGS: Frontal view of the pelvis as well as frontal and frogleg lateral views of the left hip are obtained. There is cortical discontinuity along the superior margin of the greater trochanter, consistent with a nondisplaced intertrochanteric left hip fracture. This is only well seen on the frontal projections. No subluxation or dislocation. Joint spaces are well preserved. Postsurgical changes from lower lumbar fusion. IMPRESSION: 1. Nondisplaced intertrochanteric left hip fracture. Electronically Signed   By: Sharlet Salina M.D.   On: 01/13/2020 22:02   DG FEMUR MIN 2 VIEWS LEFT  Result Date: 01/14/2020 CLINICAL DATA:  Left intramedullary nail placement EXAM: LEFT FEMUR 2 VIEWS COMPARISON:  01/13/2020 FINDINGS:  The patient has undergone intramedullary nail placement on the left. The hardware appears intact. The alignment is improved. There are expected postsurgical changes. IMPRESSION: Improved alignment of the left femur status post intramedullary nail placement. Electronically Signed   By: Katherine Mantle M.D.   On: 01/14/2020 15:24    Scheduled Meds: . amLODipine  5 mg Oral QPM  . docusate sodium  100 mg Oral BID  . enoxaparin (LOVENOX) injection  40 mg Subcutaneous Q24H  . escitalopram  20 mg Oral Daily  . feeding supplement (ENSURE ENLIVE)  237 mL Oral BID BM  . folic acid  1 mg Oral Daily  . LORazepam  0-4 mg Intravenous Q6H   Or  . LORazepam  0-4 mg Oral Q6H  . [START ON 01/16/2020] LORazepam  0-4 mg Intravenous Q12H   Or  . [START ON 01/16/2020] LORazepam  0-4 mg Oral Q12H  . nicotine  21 mg Transdermal Daily  . pantoprazole  40 mg Oral BID  . thiamine  100 mg Oral Daily   Or  . thiamine  100 mg Intravenous Daily  . traMADol  50 mg Oral Q6H  . traZODone  100 mg Oral QHS   Continuous Infusions: . sodium chloride 100 mL/hr at 01/14/20 0533  . sodium chloride 75  mL/hr at 01/15/20 0631  . methocarbamol (ROBAXIN) IV       LOS: 1 day    Gertha Calkin, MD [facility] Pager [phone] If 7PM-7AM, please contact night-coverage [email or web address] Password [facility] 01/15/2020, 2:44 PM

## 2020-01-15 NOTE — Anesthesia Postprocedure Evaluation (Signed)
Anesthesia Post Note  Patient: Chad [name]  Procedure(s) Performed: INTRAMEDULLARY (IM) NAIL INTERTROCHANTRIC (Left )  Patient location during evaluation: Other Anesthesia Type: Spinal Level of consciousness: awake and alert Pain management: pain level controlled Respiratory status: spontaneous breathing and nonlabored ventilation Cardiovascular status: stable Postop Assessment: no apparent nausea or vomiting, spinal receding and no headache Anesthetic complications: no   No complications documented.   Last Vitals:  Vitals:   01/14/20 2349 01/15/20 0348  BP: (!) 132/98 114/72  Pulse: 99 88  Resp: 16 17  Temp: 36.8 C 36.8 C  SpO2: 97% 98%    Last Pain:  Vitals:   01/15/20 0348  TempSrc: Oral  PainSc:                  Karleen Hampshire

## 2020-01-15 NOTE — Evaluation (Signed)
Occupational Therapy Evaluation Patient Details Name: Chad [name] MRN: [id number] DOB: February 25, 1951 Today's Date: 01/15/2020    History of Present Illness Per MD note:Chad [name]  is a 69 y.o. Caucasian male with a known history of hypertension, anxiety and depression, as well as ongoing alcohol abuse, who presented to the emergency room with acute onset left hip pain after having an accidental mechanical fall.  He was going to the bathroom apparently lost balance when he fell on his left side.  Denies any presyncope or syncope headache dizziness or blurred vision, chest pain or dyspnea or palpitations.  No paresthesias or focal muscle weakness.  No nausea or vomiting or abdominal pain.  No dysuria, oliguria or hematuria or flank pain.  His last alcoholic drink was at 2 PM.  He usually drinks 2-3 mixed drinks.  He was noted to be shaking the emergency room.   Clinical Impression   Patient is 69 yrs old gentleman - agreeing to participate in OT eval. Pt show good sitting balance with decrease standing balance and functional mobility using RW with Min A - impulsive and decrease safety because of pain and history of alcohol abuse,  Bilateral UE AROM and strength WFL to participate in ADL's and IADL's -0 able to do LB dressing on R LE - crossing LE over R - but because of pain and ORIF to R -   Mod A for  LB dressing and bathing and toilet transfers using RW with min A and mod v/c for safety.  He has fatigue following transfer to chair. Patient will benefit from skilled OT services to address deficits as in "OT problem list " .       Follow Up Recommendations  SNF    Equipment Recommendations  3 in 1 bedside commode    Recommendations for Other Services PT consult     Precautions / Restrictions Precautions Precautions: Fall Restrictions Weight Bearing Restrictions: Yes Other Position/Activity Restrictions: L LE WBAT      Mobility Bed Mobility Overal bed mobility: Needs  Assistance Bed Mobility: Supine to Sit;Sit to Supine     Supine to sit: Mod assist Sit to supine: Mod assist      Transfers Overall transfer level: Needs assistance Equipment used: Rolling walker (2 wheeled) Transfers: Sit to/from Stand Sit to Stand: Mod assist         General transfer comment: Min guard for functional mobility. Cueing required for safety/sequencing. sitting down without reaching , and to slow down    Balance Overall balance assessment: Needs assistance Sitting-balance support: Bilateral upper extremity supported Sitting balance-Leahy Scale: Good Sitting balance - Comments: Steady static/dynamic sitting during functional tasks   Standing balance support: During functional activity;Single extremity supported;Bilateral upper extremity supported Standing balance-Leahy Scale: Fair Standing balance comment: RW                           ADL either performed or assessed with clinical judgement   ADL Overall ADL's : Needs assistance/impaired (UB dressing and bathing Min A with setup , LB dressing Mod A - was able to do socks ind on R side - grooming and eating I - using urinal per pt)                                             Vision Baseline Vision/History: Wears glasses (  spare glassess broke - pt trying to keep them on face during eval) Wears Glasses: At all times Patient Visual Report: No change from baseline       Perception     Praxis      Pertinent Vitals/Pain Pain Assessment: 0-10 Pain Score: 8  Faces Pain Scale: Hurts whole lot Pain Location: L hip Pain Descriptors / Indicators: Grimacing;Guarding;Constant;Sore Pain Intervention(s): Limited activity within patient's tolerance     Hand Dominance     Extremity/Trunk Assessment Upper Extremity Assessment Upper Extremity Assessment: Overall WFL for tasks assessed   Lower Extremity Assessment Lower Extremity Assessment: LLE deficits/detail LLE Deficits /  Details: -2/5 hip flex and abd       Communication Communication Communication: No difficulties   Cognition Arousal/Alertness: Awake/alert Behavior During Therapy: WFL for tasks assessed/performed Overall Cognitive Status: Within Functional Limits for tasks assessed                                 General Comments: Able to follow instructions- but safety - impulsive with sitting down - not reaching out   General Comments       Exercises     Shoulder Instructions      Home Living Family/patient expects to be discharged to:: Private residence Living Arrangements: Alone Available Help at Discharge: Friend(s) Type of Home: Apartment Home Access: Stairs to enter [employer] of Steps: 13 Entrance Stairs-Rails: Right Home Layout: One level     Bathroom Shower/Tub: Chief Strategy Officer: Standard Bathroom Accessibility: No   Home Equipment: Cane - single point          Prior Functioning/Environment Level of Independence: Independent with assistive device(s)        Comments: Pt independent for bathing, dressing, and BADL management. He uses a [facility] for functional mobility. +drives; endorses multiple (at least 4) falls in last 6 months.        OT Problem List: Decreased strength;Pain;Decreased activity tolerance;Decreased safety awareness;Impaired balance (sitting and/or standing);Decreased knowledge of use of DME or AE;Decreased knowledge of precautions      OT Treatment/Interventions: Self-care/ADL training;Therapeutic exercise;Therapeutic activities;DME and/or AE instruction;Patient/family education;Balance training;Neuromuscular education    OT Goals(Current goals can be found in the care plan section) Acute Rehab OT Goals Patient Stated Goal: be able to walk better OT Goal Formulation: With patient Time For Goal Achievement: 01/15/20 Potential to Achieve Goals: Good  OT Frequency: Min 2X/week   Barriers to D/C: Decreased  caregiver support;Inaccessible home environment          Co-evaluation              AM-PAC OT "6 Clicks" Daily Activity     Outcome Measure Help from another person eating meals?: A Little Help from another person taking care of personal grooming?: A Little Help from another person toileting, which includes using toliet, bedpan, or urinal?: A Lot Help from another person bathing (including washing, rinsing, drying)?: A Lot Help from another person to put on and taking off regular upper body clothing?: A Little Help from another person to put on and taking off regular lower body clothing?: A Lot 6 Click Score: 15   End of Session Equipment Utilized During Treatment: Gait belt;Rolling walker  Activity Tolerance: Patient tolerated treatment well Patient left: with call bell/phone within reach;Other (comment);in chair;with chair alarm set  OT Visit Diagnosis: Other abnormalities of gait and mobility (R26.89);Muscle weakness (generalized) (M62.81);History of falling (  Z91.81);Pain;Repeated falls (R29.6) Pain - Right/Left: Left Pain - part of body: Hip                Time: 0950-1020 OT Time Calculation (min): 30 min Charges:  OT General Charges $OT Visit: 1 Visit OT Evaluation $OT Eval Low Complexity: 1 Low    Briany Aye OTR/L,CLT 01/15/2020, 12:07 PM

## 2020-01-16 ENCOUNTER — Encounter: Payer: Self-pay | Admitting: Orthopedic Surgery

## 2020-01-16 DIAGNOSIS — F1010 Alcohol abuse, uncomplicated: Secondary | ICD-10-CM

## 2020-01-16 NOTE — Progress Notes (Signed)
PROGRESS NOTE    Chad [name]  YNW:[id number] DOB: 03-06-1951 DOA: 01/14/2020 PCP: Patient, No Pcp Per   Brief Narrative:  Chad [name]  is a 69 y.o. Caucasian male with a known history of hypertension, anxiety and depression, as well as ongoing alcohol abuse, who presented to the emergency room with acute onset left hip pain after having an accidental mechanical fall.  He was going to the bathroom apparently lost balance when he fell on his left side.  Denies any presyncope or syncope headache dizziness or blurred vision, chest pain or dyspnea or palpitations.  No paresthesias or focal muscle weakness.  No nausea or vomiting or abdominal pain.  No dysuria, oliguria or hematuria or flank pain.  His last alcoholic drink was at 2 PM.  He usually drinks 2-3 mixed drinks.  He was noted to be shaking the emergency room.  Upon presentation to the emergency room, temperature was 96.6 with otherwise normal vital signs.  Labs revealed unremarkable BMP and CBC.  INR was 1.1.  Noncontrasted CT scan revealed no acute intracranial normalities.EKG showed normal sinus rhythm with a rate of 65 with poor R wave progression and low voltage QRS with T wave inversion in V1 and V2.  Chest x-ray showed shallow inspiration with no acute cardiopulmonary disease.  Left hip x-ray showed nondisplaced intertrochanteric left hip fracture.  The patient was given 50 mcg of IV fentanyl and 4 mg IV Zofran.  He was placed on CIWA protocol.  He will be admitted to a medical monitored bed for further evaluation and management.  Assessment & Plan:   Active Problems: Closed left hip fracture [facility]): Pt is s/p intramedullary nail per orthopedic. Pain management prn. Stable no c/o.   Anemia: Attribute to operative blood loss.  Alcohol abuse: Cont CIWA protocol. Thiamine daily.   DVT prophylaxis: lovenox Code Status: full Family Communication: none at bedside. Disposition Plan: STR.  Status is: Inpatient  Remains  inpatient appropriate because:Inpatient level of care appropriate due to severity of illness   Dispo: The patient is from: Home              Anticipated d/c is to: Home              Anticipated d/c date is: 3 days              Patient currently is not medically stable to d/c.  Consultants:   Orthopedic: Hip FX left intertrochanteric  Procedures:  Hip FX left intertrochanteric.  Antimicrobials:  Anti-infectives (From admission, onward)   Start     Dose/Rate Route Frequency Ordered Stop   01/14/20 2000  ceFAZolin (ANCEF) IVPB 2g/100 mL premix        2 g 200 mL/hr over 30 Minutes Intravenous Every 6 hours 01/14/20 1748 01/15/20 0847   01/14/20 1100  ceFAZolin (ANCEF) IVPB 2g/100 mL premix        2 g 200 mL/hr over 30 Minutes Intravenous  Once 01/14/20 0735 01/14/20 1424     Subjective: Pt is alert and denies any complaints.  vitals and labs are stable.  Pt seen today he is eating breakfast. Labs are stable.  Objective: Vitals:   01/15/20 0823 01/15/20 1224 01/15/20 2328 01/16/20 0738  BP: (!) 144/96 135/78 (!) 147/78 123/80  Pulse: 86 82 87 83  Resp: 17 19 19 17   Temp: 98.1 F (36.7 C) 98.4 F (36.9 C) 98.3 F (36.8 C) 98.7 F (37.1 C)  TempSrc: Oral Oral Oral   SpO2: 97%  98% 94% 95%  Weight:      Height:        Intake/Output Summary (Last 24 hours) at 01/16/2020 1313 Last data filed at 01/16/2020 1004 Gross per 24 hour  Intake 2644.45 ml  Output 1000 ml  Net 1644.45 ml   Filed Weights   01/13/20 2104  Weight: 71 kg    Examination: Blood pressure 123/80, pulse 83, temperature 98.7 F (37.1 C), resp. rate 17, height 5\' 9"  (1.753 m), weight 71 kg, SpO2 95 %.  General exam: Appears calm and comfortable  Respiratory system: Clear to auscultation. Respiratory effort normal. Cardiovascular system: S1 & S2 heard, RRR. No JVD, murmurs, rubs, gallops or clicks. No pedal edema. Gastrointestinal system: Abdomen is nondistended, soft and nontender. No organomegaly  or masses felt. Normal bowel sounds heard. Central nervous system: Alert and oriented. No focal neurological deficits. Extremities: pt moving all ext and pulses intact.  Skin: No rashes, lesions or ulcers Psychiatry: Judgement and insight appear normal. Mood & affect appropriate.   Data Reviewed: I have personally reviewed following labs and imaging studies  I/O last 3 completed shifts: In: 3223.2 [P.O.:480; I.V.:2543.2; IV Piggyback:200] Out: 1600 [Urine:1600] Total I/O In: 120 [P.O.:120] Out: 300 [Urine:300] Lab Results  Component Value Date   CREATININE 0.56 (L) 01/15/2020   CREATININE 0.73 01/14/2020   CREATININE 0.74 01/13/2020   CBC: Recent Labs  Lab 01/13/20 2118 01/14/20 0415 01/15/20 0425  WBC 7.1 9.0 10.2  NEUTROABS 4.1  --   --   HGB 13.8 11.8* 10.8*  HCT 39.5 34.8* 32.6*  MCV 90.0 89.7 92.6  PLT 340 299 244   Basic Metabolic Panel: Recent Labs  Lab 01/13/20 2118 01/14/20 0415 01/15/20 0425  NA 141 141 135  K 4.0 4.1 3.8  CL 104 103 101  CO2 24 28 27   GLUCOSE 107* 86 105*  BUN 9 11 8   CREATININE 0.74 0.73 0.56*  CALCIUM 9.0 8.6* 8.2*  Coagulation Profile: Recent Labs  Lab 01/14/20 0146  INR 1.1    Recent Results (from the past 240 hour(s))  Respiratory Panel by RT PCR (Flu A&B, Covid) - Nasopharyngeal Swab     Status: None   Collection Time: 01/14/20  2:52 AM   Specimen: Nasopharyngeal Swab  Result Value Ref Range Status   SARS Coronavirus 2 by RT PCR NEGATIVE NEGATIVE Final    Comment: (NOTE) SARS-CoV-2 target nucleic acids are NOT DETECTED.  The SARS-CoV-2 RNA is generally detectable in upper respiratoy specimens during the acute phase of infection. The lowest concentration of SARS-CoV-2 viral copies this assay can detect is 131 copies/mL. A negative result does not preclude SARS-Cov-2 infection and should not be used as the sole basis for treatment or other patient management decisions. A negative result may occur with  improper  specimen collection/handling, submission of specimen other than nasopharyngeal swab, presence of viral mutation(s) within the areas targeted by this assay, and inadequate number of viral copies (<131 copies/mL). A negative result must be combined with clinical observations, patient history, and epidemiological information. The expected result is Negative.  Fact Sheet for Patients:   Fact Sheet for Healthcare Providers:  01/16/20  This test is no t yet approved or cleared by the 01/16/20 FDA and  has been authorized for detection and/or diagnosis of SARS-CoV-2 by FDA under an Emergency Use Authorization (EUA). This EUA will remain  in effect (meaning this test can be used) for the duration of the COVID-19 declaration under Section 564(b)(1) of  the Act, 21 U.S.C. section 360bbb-3(b)(1), unless the authorization is terminated or revoked sooner.     Influenza A by PCR NEGATIVE NEGATIVE Final   Influenza B by PCR NEGATIVE NEGATIVE Final    Comment: (NOTE) The Xpert Xpress SARS-CoV-2/FLU/RSV assay is intended as an aid in  the diagnosis of influenza from Nasopharyngeal swab specimens and  should not be used as a sole basis for treatment. Nasal washings and  aspirates are unacceptable for Xpert Xpress SARS-CoV-2/FLU/RSV  testing.  Fact Sheet for Patients: https://www.moore.com/  Fact Sheet for Healthcare Providers: https://www.young.biz/  This test is not yet approved or cleared by the [place] FDA and  has been authorized for detection and/or diagnosis of SARS-CoV-2 by  FDA under an Emergency Use Authorization (EUA). This EUA will remain  in effect (meaning this test can be used) for the duration of the  Covid-19 declaration under Section 564(b)(1) of the Act, 21  U.S.C. section 360bbb-3(b)(1), unless the authorization is  terminated or revoked. Performed at  [facility], [address], [place], [place] [address]      Radiology Studies: DG C-Arm 1-60 Min  Result Date: 01/14/2020 CLINICAL DATA:  Left-sided intramedullary nail placement EXAM: DG C-ARM 1-60 MIN FLUOROSCOPY TIME:  Fluoroscopy Time:  44 seconds Number of Acquired Spot Images: 5 COMPARISON:  01/13/2020 FINDINGS: The patient has undergone intramedullary nail placement on the left. The hardware appears intact. The alignment is improved. There are expected postsurgical changes. IMPRESSION: Status post intramedullary nail placement on the left. Electronically Signed   By: Katherine Mantle M.D.   On: 01/14/2020 15:24   DG FEMUR MIN 2 VIEWS LEFT  Result Date: 01/14/2020 CLINICAL DATA:  Left intramedullary nail placement EXAM: LEFT FEMUR 2 VIEWS COMPARISON:  01/13/2020 FINDINGS: The patient has undergone intramedullary nail placement on the left. The hardware appears intact. The alignment is improved. There are expected postsurgical changes. IMPRESSION: Improved alignment of the left femur status post intramedullary nail placement. Electronically Signed   By: Katherine Mantle M.D.   On: 01/14/2020 15:24    Scheduled Meds: . amLODipine  5 mg Oral QPM  . docusate sodium  100 mg Oral BID  . enoxaparin (LOVENOX) injection  40 mg Subcutaneous Q24H  . escitalopram  20 mg Oral Daily  . feeding supplement (ENSURE ENLIVE)  237 mL Oral BID BM  . folic acid  1 mg Oral Daily  . LORazepam  0-4 mg Intravenous Q12H   Or  . LORazepam  0-4 mg Oral Q12H  . nicotine  21 mg Transdermal Daily  . pantoprazole  40 mg Oral BID  . thiamine  100 mg Oral Daily   Or  . thiamine  100 mg Intravenous Daily  . traMADol  50 mg Oral Q6H  . traZODone  100 mg Oral QHS   Continuous Infusions: . sodium chloride 100 mL/hr at 01/14/20 0533  . sodium chloride 75 mL/hr at 01/16/20 0635  . methocarbamol (ROBAXIN) IV       LOS: 2 days    Gertha Calkin, MD [facility] Pager [phone] If  7PM-7AM, please contact night-coverage [email or web address] Password [email or web address] 01/16/2020, 1:13 PM

## 2020-01-16 NOTE — Progress Notes (Signed)
Occupational Therapy Treatment Patient Details Name: Chad [name] MRN: [id number] DOB: Nov 13, 1950 Today's Date: 01/16/2020    History of present illness Per MD note:Chad [name]  is a 69 y.o. Caucasian male with a known history of hypertension, anxiety and depression, as well as ongoing alcohol abuse, who presented to the emergency room with acute onset left hip pain after having an accidental mechanical fall.  He was going to the bathroom apparently lost balance when he fell on his left side.  Denies any presyncope or syncope headache dizziness or blurred vision, chest pain or dyspnea or palpitations.  No paresthesias or focal muscle weakness.  No nausea or vomiting or abdominal pain.  No dysuria, oliguria or hematuria or flank pain.  His last alcoholic drink was at 2 PM.  He usually drinks 2-3 mixed drinks.  He was noted to be shaking the emergency room.   OT comments  Pt seen for OT treeatment, Co-Tx with PTA for pt safety/tolerance to f/u re: safety with ADLs/ADL mobility. Pt requires MOD verbal/tactile cues throughout to attend and for safety/sequencing. OT engages pt in EOB seated oral care with MIN A to manage holding basin/wrag as pt requires at least support of 1 UE to sustain static sit while performing a self care task. In adiditon, PTA/OT engage pt in sit to stand transfer with MIN A +2 with cues for safe hand placement, but pt unable to adhere. Pt completes ~4-5 steps with RW from bed to chair with MIN A +2 to assist with advancing R LE, managing walker, and sustaining balance. OT engages pt in ed re: seated UB therex he can complete while up to chair with pt only minimally attending to education and only offering return demo of 1/3 exercises covered. Will require follow up as pt was somewhat lethargic this session. Will continue to follow per OT POC. Continue to recommend SNF as most beneficial d/c recommendation d/t low tolerance and decreased ability to perform transfers/self care versus  baseline.   Follow Up Recommendations  SNF    Equipment Recommendations  3 in 1 bedside commode    Recommendations for Other Services      Precautions / Restrictions Precautions Precautions: Fall Precaution Comments: poor safey Restrictions Weight Bearing Restrictions: Yes LLE Weight Bearing: Weight bearing as tolerated Other Position/Activity Restrictions: L LE WBAT       Mobility Bed Mobility Overal bed mobility: Needs Assistance Bed Mobility: Supine to Sit     Supine to sit: Mod assist;+2 for physical assistance Sit to supine: Mod assist;+2 for physical assistance   General bed mobility comments: cues to sequence-hand/foot placement  Transfers Overall transfer level: Needs assistance Equipment used: Rolling walker (2 wheeled) Transfers: Sit to/from Stand Sit to Stand: Min assist;+2 physical assistance         General transfer comment: ed provided re: safe hand placement with RW for transfers but pt continues to try to pull up to stand    Balance Overall balance assessment: Needs assistance Sitting-balance support: Feet supported;Single extremity supported Sitting balance-Leahy Scale: Fair Sitting balance - Comments: forward lean and tremors   Standing balance support: During functional activity;Single extremity supported;Bilateral upper extremity supported Standing balance-Leahy Scale: Poor Standing balance comment: heavy use of RW, limited by pain, requires MIN A of 1 person to sustain static stand, 2p required to take steps d/t difficulty advancing R LE  ADL either performed or assessed with clinical judgement   ADL Overall ADL's : Needs assistance/impaired     Grooming: Oral care;Minimal assistance;Sitting Grooming Details (indicate cue type and reason): MIN A to hold items while pt in sitting as he requires at least support of on UE to sustain static sitting balance                              Functional mobility during ADLs: Minimal assistance;+2 for physical assistance;+2 for safety/equipment;Rolling walker (to take 4-5 steps from EOB to recliner d/t need for manual assist to advance operative LE and assist to sustain balance, manage walker, etc.)       Vision Baseline Vision/History: Wears glasses Wears Glasses: At all times Patient Visual Report: No change from baseline Additional Comments: glasses broken with only L lens in place   Perception     Praxis      Cognition Arousal/Alertness: Lethargic Behavior During Therapy: Impulsive Overall Cognitive Status: Impaired/Different from baseline                                 General Comments: oriented to person, place, time and some aspects of situation but difficulty with sequencing, requires increased processing time        Exercises Other Exercises Other Exercises: OT facilitates ed re: seated UB/core therex pt could be performing to improve UB strength as it pertains to safety with ADL transfers/standing balance for performing aspects of self care routine. Pt with poor retention of information this session 2/2 lethargy. Once up to chair, pt in/out of sleep requiring cues to attend to education with ed largely not being fruitful   Shoulder Instructions       General Comments      Pertinent Vitals/ Pain       Pain Assessment: Faces Faces Pain Scale: Hurts little more Pain Location: L hip Pain Descriptors / Indicators: Grimacing;Guarding;Constant;Sore Pain Intervention(s): Limited activity within patient's tolerance;Monitored during session;Repositioned  Home Living                                          Prior Functioning/Environment              Frequency  Min 2X/week        Progress Toward Goals  OT Goals(current goals can now be found in the care plan section)  Progress towards OT goals: Progressing toward goals  Acute Rehab OT Goals Patient Stated Goal:  be able to walk better OT Goal Formulation: With patient Time For Goal Achievement: 01/29/20 Potential to Achieve Goals: Good  Plan Discharge plan remains appropriate    Co-evaluation    PT/OT/SLP Co-Evaluation/Treatment: Yes Reason for Co-Treatment: Complexity of the patient's impairments (multi-system involvement);Necessary to address cognition/behavior during functional activity;To address functional/ADL transfers PT goals addressed during session: Mobility/safety with mobility;Balance OT goals addressed during session: ADL's and self-care      AM-PAC OT "6 Clicks" Daily Activity     Outcome Measure   Help from another person eating meals?: A Little Help from another person taking care of personal grooming?: A Little Help from another person toileting, which includes using toliet, bedpan, or urinal?: A Lot Help from another person bathing (including washing, rinsing, drying)?: A Lot Help from another person to put  on and taking off regular upper body clothing?: A Little Help from another person to put on and taking off regular lower body clothing?: A Lot 6 Click Score: 15    End of Session Equipment Utilized During Treatment: Gait belt;Rolling walker  OT Visit Diagnosis: Other abnormalities of gait and mobility (R26.89);Muscle weakness (generalized) (M62.81);History of falling (Z91.81);Pain;Repeated falls (R29.6) Pain - Right/Left: Left Pain - part of body: Hip   Activity Tolerance Patient tolerated treatment well   Patient Left with call bell/phone within reach;in chair;with chair alarm set   Nurse Communication Mobility status        Time: 1610-9604 OT Time Calculation (min): 17 min  Charges: OT General Charges $OT Visit: 1 Visit OT Treatments $Self Care/Home Management : 8-22 mins  Rejeana Brock, MS, OTR/L ascom [phone] 01/16/20, 3:28 PM

## 2020-01-16 NOTE — Progress Notes (Signed)
Physical Therapy Treatment Patient Details Name: Chad [name] MRN: [id number] DOB: 1950/04/30 Today's Date: 01/16/2020    History of Present Illness Per MD note:Chad [name]  is a 69 y.o. Caucasian male with a known history of hypertension, anxiety and depression, as well as ongoing alcohol abuse, who presented to the emergency room with acute onset left hip pain after having an accidental mechanical fall.  He was going to the bathroom apparently lost balance when he fell on his left side.  Denies any presyncope or syncope headache dizziness or blurred vision, chest pain or dyspnea or palpitations.  No paresthesias or focal muscle weakness.  No nausea or vomiting or abdominal pain.  No dysuria, oliguria or hematuria or flank pain.  His last alcoholic drink was at 2 PM.  He usually drinks 2-3 mixed drinks.  He was noted to be shaking the emergency room.    PT Comments    Pt OOB x 2 hours today asking to get back to bed.  Stood with min a x 2 and transferred to bed.  Overall improved quality from this am but remains hesitant to WB on LLE.  He is able to manage getting his own LE's back onto bed this session.  Positioned for comfort and needs met.  Remains lethargic and seems to fall back to sleep after session.   Follow Up Recommendations  SNF     Equipment Recommendations  Rolling walker with 5" wheels    Recommendations for Other Services       Precautions / Restrictions Precautions Precautions: Fall Precaution Comments: poor safey Restrictions Weight Bearing Restrictions: Yes LLE Weight Bearing: Weight bearing as tolerated Other Position/Activity Restrictions: L LE WBAT    Mobility  Bed Mobility Overal bed mobility: Needs Assistance Bed Mobility: Sit to Supine     Supine to sit: Mod assist;+2 for physical assistance Sit to supine: Mod assist;+2 for physical assistance      Transfers Overall transfer level: Needs assistance Equipment used: Rolling walker (2  wheeled) Transfers: Sit to/from Stand Sit to Stand: Min assist;+2 physical assistance         General transfer comment: pulls up on walker despite attempts for education  Ambulation/Gait Ambulation/Gait assistance: Min assist;+2 physical assistance Gait Distance (Feet): 3 Feet Assistive device: Rolling walker (2 wheeled) Gait Pattern/deviations: Step-to pattern;Trunk flexed Gait velocity: decreased   General Gait Details: generally poor gait quality and increased risk for falls but improved from prior treatment this am   Stairs             Wheelchair Mobility    Modified Rankin (Stroke Patients Only)       Balance Overall balance assessment: Needs assistance Sitting-balance support: Feet supported Sitting balance-Leahy Scale: Fair     Standing balance support: During functional activity;Single extremity supported;Bilateral upper extremity supported Standing balance-Leahy Scale: Poor Standing balance comment: heavy use of RW limited by pain                            Cognition Arousal/Alertness: Lethargic Behavior During Therapy: Impulsive Overall Cognitive Status: Impaired/Different from baseline                                 General Comments: more lethargic today but orientated person, place, time      Exercises Other Exercises Other Exercises: LLE AAROM x 10 for hip protocol    General Comments  Pertinent Vitals/Pain Pain Assessment: Faces Faces Pain Scale: Hurts little more Pain Location: L hip Pain Descriptors / Indicators: Grimacing;Guarding;Constant;Sore Pain Intervention(s): Limited activity within patient's tolerance;Monitored during session;Repositioned    Home Living                      Prior Function            PT Goals (current goals can now be found in the care plan section) Progress towards PT goals: Progressing toward goals    Frequency    BID      PT Plan Current plan  remains appropriate    Co-evaluation PT/OT/SLP Co-Evaluation/Treatment: Yes Reason for Co-Treatment: Complexity of the patient's impairments (multi-system involvement) PT goals addressed during session: Mobility/safety with mobility OT goals addressed during session: ADL's and self-care      AM-PAC PT "6 Clicks" Mobility   Outcome Measure  Help needed turning from your back to your side while in a flat bed without using bedrails?: A Lot Help needed moving from lying on your back to sitting on the side of a flat bed without using bedrails?: A Lot Help needed moving to and from a bed to a chair (including a wheelchair)?: A Lot Help needed standing up from a chair using your arms (e.g., wheelchair or bedside chair)?: A Lot Help needed to walk in hospital room?: A Lot Help needed climbing 3-5 steps with a railing? : Total 6 Click Score: 11    End of Session Equipment Utilized During Treatment: Gait belt Activity Tolerance: Patient limited by lethargy;Patient limited by pain Patient left: in chair;with call bell/phone within reach;with chair alarm set Nurse Communication: Mobility status;Precautions       Time: 4753-3917 PT Time Calculation (min) (ACUTE ONLY): 8 min  Charges:  $Therapeutic Activity: 8-22 mins                    Chesley Noon, PTA 01/16/20, 12:55 PM

## 2020-01-16 NOTE — [place] FL2 (Signed)
[place] MEDICAID FL2 LEVEL OF CARE SCREENING TOOL     IDENTIFICATION  Patient Name: Chad [name] Birthdate: [birth year] Sex: male Admission Date (Current Location): 01/14/2020  [place] Number:  Chiropodist and Address:  [facility], [address] [place], [place] [address]      Provider Number: [id number]  Attending Physician Name and Address:  Gertha Calkin, MD  Relative Name and Phone Number:       Current Level of Care: Hospital Recommended Level of Care: Skilled Nursing Facility Prior Approval Number:    Date Approved/Denied:   PASRR Number: [id number]  Discharge Plan: SNF    Current Diagnoses: Patient Active Problem List   Diagnosis Date Noted  . Closed left hip fracture (HCC) 01/14/2020  . Calculus of gallbladder with acute cholecystitis without obstruction   . GI bleed 12/31/2019  . GIB (gastrointestinal bleeding) 12/31/2019  . Epigastric pain   . Alcohol abuse   . Chest pain   . Cough   . Tobacco abuse   . Alcohol dependence with uncomplicated withdrawal (HCC) 01/23/2019  . 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 Anxiety and depression 01/23/2019  . HTN (hypertension) 05/17/2018  . Alcohol withdrawal (HCC) 05/17/2018  . DVT (deep venous thrombosis) (HCC) 05/17/2018  . 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 BACK PAIN, LUMBAR 11/10/2006    Orientation RESPIRATION BLADDER Height & Weight     Self, Time, Situation, Place  Normal Continent Weight: 71 kg Height:  5\' 9"  (175.3 cm)  BEHAVIORAL SYMPTOMS/MOOD NEUROLOGICAL BOWEL NUTRITION STATUS      Continent Diet (Regular)  AMBULATORY STATUS COMMUNICATION OF NEEDS Skin   Limited Assist Verbally Surgical wounds                       Personal Care Assistance Level of Assistance  Bathing, Feeding, Dressing Bathing Assistance: Limited assistance Feeding assistance: Limited assistance Dressing  Assistance: Limited assistance     Functional Limitations Info  Sight, Hearing, Speech Sight Info: Adequate Hearing Info: Adequate Speech Info: Adequate    SPECIAL CARE FACTORS FREQUENCY  PT (By licensed PT), OT (By licensed OT)                    Contractures Contractures Info: Not present    Additional Factors Info  Code Status, Allergies, Psychotropic Code Status Info: Full Code Allergies Info: Morphine, Ibuprofen Psychotropic Info: Anxiety, Depression Librium and Lexapro         Current Medications (01/16/2020):  This is the current hospital active medication list Current Facility-Administered Medications  Medication Dose Route Frequency Provider Last Rate Last Admin  . 0.9 %  sodium chloride infusion   Intravenous Continuous 01/18/2020, MD 100 mL/hr at 01/14/20 0533 New Bag at 01/14/20 0533  . 0.9 %  sodium chloride infusion   Intravenous Continuous 01/16/20, MD 75 mL/hr at 01/16/20 0635 New Bag at 01/16/20 01/18/20  . acetaminophen (TYLENOL) tablet 325-650 mg  325-650 mg Oral Q6H PRN 2836, MD      . alum & mag hydroxide-simeth (MAALOX/MYLANTA) 200-200-20 MG/5ML suspension 30 mL  30 mL Oral Q4H PRN 10-28-2000, MD      . amLODipine (NORVASC) tablet 5 mg  5 mg Oral QPM Kennedy Bucker, MD   5 mg at 01/15/20 1706  . bisacodyl (DULCOLAX) suppository 10 mg  10 mg Rectal Daily PRN 01/17/20, MD      .  docusate sodium (COLACE) capsule 100 mg  100 mg Oral BID Kennedy Bucker, MD   100 mg at 01/16/20 0846  . enoxaparin (LOVENOX) injection 40 mg  40 mg Subcutaneous Q24H Kennedy Bucker, MD   40 mg at 01/16/20 0845  . escitalopram (LEXAPRO) tablet 20 mg  20 mg Oral Daily Kennedy Bucker, MD   20 mg at 01/16/20 0849  . feeding supplement (ENSURE ENLIVE) (ENSURE ENLIVE) liquid 237 mL  237 mL Oral BID BM Kennedy Bucker, MD   237 mL at 01/16/20 0849  . fentaNYL (SUBLIMAZE) injection 25 mcg  25 mcg Intravenous Q4H PRN Kennedy Bucker, MD   25 mcg at 01/14/20 2104  . folic  acid (FOLVITE) tablet 1 mg  1 mg Oral Daily Kennedy Bucker, MD   1 mg at 01/16/20 0850  . HYDROcodone-acetaminophen (NORCO) 7.5-325 MG per tablet 1-2 tablet  1-2 tablet Oral Q4H PRN Kennedy Bucker, MD   2 tablet at 01/16/20 0847  . hydrOXYzine (ATARAX/VISTARIL) tablet 25 mg  25 mg Oral TID PRN Kennedy Bucker, MD   25 mg at 01/15/20 0641  . LORazepam (ATIVAN) injection 0-4 mg  0-4 mg Intravenous Q12H Kennedy Bucker, MD       Or  . LORazepam (ATIVAN) tablet 0-4 mg  0-4 mg Oral Q12H Kennedy Bucker, MD      . magnesium citrate solution 1 Bottle  1 Bottle Oral Once PRN Kennedy Bucker, MD      . magnesium hydroxide (MILK OF MAGNESIA) suspension 30 mL  30 mL Oral Daily PRN Kennedy Bucker, MD   30 mL at 01/16/20 0848  . menthol-cetylpyridinium (CEPACOL) lozenge 3 mg  1 lozenge Oral PRN Kennedy Bucker, MD       Or  . phenol (CHLORASEPTIC) mouth spray 1 spray  1 spray Mouth/Throat PRN Kennedy Bucker, MD      . methocarbamol (ROBAXIN) tablet 500 mg  500 mg Oral Q6H PRN Kennedy Bucker, MD   500 mg at 01/15/20 0334   Or  . methocarbamol (ROBAXIN) 500 mg in dextrose 5 % 50 mL IVPB  500 mg Intravenous Q6H PRN Kennedy Bucker, MD      . metoCLOPramide (REGLAN) tablet 5-10 mg  5-10 mg Oral Q8H PRN Kennedy Bucker, MD       Or  . metoCLOPramide (REGLAN) injection 5-10 mg  5-10 mg Intravenous Q8H PRN Kennedy Bucker, MD      . nicotine (NICODERM CQ - dosed in mg/24 hours) patch 21 mg  21 mg Transdermal Daily Kennedy Bucker, MD   21 mg at 01/16/20 0848  . ondansetron (ZOFRAN) tablet 4 mg  4 mg Oral Q6H PRN Kennedy Bucker, MD       Or  . ondansetron [facility]) injection 4 mg  4 mg Intravenous Q6H PRN Kennedy Bucker, MD      . oxyCODONE (Oxy IR/ROXICODONE) immediate release tablet 10 mg  10 mg Oral Q6H PRN Manuela Schwartz, NP   10 mg at 01/15/20 0810  . pantoprazole (PROTONIX) EC tablet 40 mg  40 mg Oral BID Kennedy Bucker, MD   40 mg at 01/16/20 0847  . thiamine tablet 100 mg  100 mg Oral Daily Kennedy Bucker, MD   100 mg at 01/16/20  0846   Or  . thiamine (B-1) injection 100 mg  100 mg Intravenous Daily Kennedy Bucker, MD      . traMADol Janean Sark) tablet 50 mg  50 mg Oral Q6H Kennedy Bucker, MD   50 mg at 01/16/20 0008  .  traZODone (DESYREL) tablet 100 mg  100 mg Oral QHS Kennedy Bucker, MD   100 mg at 01/16/20 0009  . zolpidem (AMBIEN) tablet 5 mg  5 mg Oral QHS PRN Kennedy Bucker, MD         Discharge Medications: Please see discharge summary for a list of discharge medications.  Relevant Imaging Results:  Relevant Lab Results:   Additional Information SS#: [id number]  Trenton Founds, RN

## 2020-01-16 NOTE — Progress Notes (Signed)
Physical Therapy Treatment Patient Details Name: Chad [name] MRN: [id number] DOB: 1950/10/21 Today's Date: 01/16/2020    History of Present Illness Per MD note:Chad [name]  is a 69 y.o. Caucasian male with a known history of hypertension, anxiety and depression, as well as ongoing alcohol abuse, who presented to the emergency room with acute onset left hip pain after having an accidental mechanical fall.  He was going to the bathroom apparently lost balance when he fell on his left side.  Denies any presyncope or syncope headache dizziness or blurred vision, chest pain or dyspnea or palpitations.  No paresthesias or focal muscle weakness.  No nausea or vomiting or abdominal pain.  No dysuria, oliguria or hematuria or flank pain.  His last alcoholic drink was at 2 PM.  He usually drinks 2-3 mixed drinks.  He was noted to be shaking the emergency room.    PT Comments    Co-tx with OT - Only 1 unit, OT billed per protocol  Participated in exercises as described below.  To EOB with mod a x 2,  Stood with RW pulling up on walker despite attempts to educate.  He takes several small unsteady steps to recliner with overall poor safety and quality.  "How much more do I have to do?" Remained in recliner after session.  Pt more lethargic today and less engaged compared to yesterday but more orientated.  He did state he put himself back to bed yesterday.  Re-enforced need to call for assist with mobility.  Voiced understanding.  Call bed, phone, and all needs in reach.  Chair alarm set and tech aware.     Follow Up Recommendations  SNF     Equipment Recommendations  Rolling walker with 5" wheels    Recommendations for Other Services       Precautions / Restrictions Precautions Precautions: Fall Precaution Comments: poor safey and DT"s Restrictions Weight Bearing Restrictions: Yes LLE Weight Bearing: Weight bearing as tolerated Other Position/Activity Restrictions: L LE WBAT    Mobility   Bed Mobility Overal bed mobility: Needs Assistance Bed Mobility: Supine to Sit     Supine to sit: Mod assist;+2 for physical assistance        Transfers Overall transfer level: Needs assistance Equipment used: Rolling walker (2 wheeled) Transfers: Sit to/from Stand Sit to Stand: Min assist;+2 physical assistance         General transfer comment: pulls up on walker despite attempts for education  Ambulation/Gait Ambulation/Gait assistance: Min assist;+2 physical assistance;Mod assist Gait Distance (Feet): 3 Feet Assistive device: Rolling walker (2 wheeled) Gait Pattern/deviations: Step-to pattern;Trunk flexed     General Gait Details: generally poor gait quality and increased risk for falls.   Stairs             Wheelchair Mobility    Modified Rankin (Stroke Patients Only)       Balance Overall balance assessment: Needs assistance Sitting-balance support: Feet supported Sitting balance-Leahy Scale: Fair     Standing balance support: During functional activity;Single extremity supported;Bilateral upper extremity supported Standing balance-Leahy Scale: Poor Standing balance comment: heavy use of RW limited by pain                            Cognition Arousal/Alertness: Lethargic Behavior During Therapy: Impulsive Overall Cognitive Status: Impaired/Different from baseline  General Comments: more lethargic today but orientated person, place, time      Exercises Other Exercises Other Exercises: LLE AAROM x 10 for hip protocol    General Comments        Pertinent Vitals/Pain Pain Assessment: Faces Faces Pain Scale: Hurts whole lot Pain Location: L hip Pain Descriptors / Indicators: Grimacing;Guarding;Constant;Sore Pain Intervention(s): Limited activity within patient's tolerance;Monitored during session;Repositioned;Ice applied    Home Living                      Prior Function             PT Goals (current goals can now be found in the care plan section) Progress towards PT goals: Progressing toward goals    Frequency    BID      PT Plan Current plan remains appropriate    Co-evaluation PT/OT/SLP Co-Evaluation/Treatment: Yes Reason for Co-Treatment: Complexity of the patient's impairments (multi-system involvement) PT goals addressed during session: Mobility/safety with mobility OT goals addressed during session: ADL's and self-care      AM-PAC PT "6 Clicks" Mobility   Outcome Measure  Help needed turning from your back to your side while in a flat bed without using bedrails?: A Lot Help needed moving from lying on your back to sitting on the side of a flat bed without using bedrails?: A Lot Help needed moving to and from a bed to a chair (including a wheelchair)?: A Lot Help needed standing up from a chair using your arms (e.g., wheelchair or bedside chair)?: A Lot Help needed to walk in hospital room?: A Lot Help needed climbing 3-5 steps with a railing? : Total 6 Click Score: 11    End of Session Equipment Utilized During Treatment: Gait belt Activity Tolerance: Patient limited by lethargy;Patient limited by pain Patient left: in chair;with call bell/phone within reach;with chair alarm set Nurse Communication: Mobility status;Precautions       Time:  -     Charges:    OT billed as only 1 unit was given.                   [name], PTA 01/16/20, 10:08 AM

## 2020-01-16 NOTE — Progress Notes (Signed)
   Subjective: 2 Days Post-Op Procedure(s) (LRB): INTRAMEDULLARY (IM) NAIL INTERTROCHANTRIC (Left) Patient reports pain as moderate.  Patient sleeping Patient is well, and has had no acute complaints or problems Denies any CP, SOB, ABD pain. We will continue with physical therapy today.   Objective: Vital signs in last 24 hours: Temp:  [98.3 F (36.8 C)-98.7 F (37.1 C)] 98.7 F (37.1 C) (09/27 0738) Pulse Rate:  [82-87] 83 (09/27 0738) Resp:  [17-19] 17 (09/27 0738) BP: (123-147)/(78-80) 123/80 (09/27 0738) SpO2:  [94 %-98 %] 95 % (09/27 0738)  Intake/Output from previous day: 09/26 0701 - 09/27 0700 In: 2524.5 [I.V.:2524.5] Out: 1000 [Urine:1000] Intake/Output this shift: Total I/O In: -  Out: 300 [Urine:300]  Recent Labs    01/13/20 2118 01/14/20 0415 01/15/20 0425  HGB 13.8 11.8* 10.8*   Recent Labs    01/14/20 0415 01/15/20 0425  WBC 9.0 10.2  RBC 3.88* 3.52*  HCT 34.8* 32.6*  PLT 299 244   Recent Labs    01/14/20 0415 01/15/20 0425  NA 141 135  K 4.1 3.8  CL 103 101  CO2 28 27  BUN 11 8  CREATININE 0.73 0.56*  GLUCOSE 86 105*  CALCIUM 8.6* 8.2*   Recent Labs    01/14/20 0146  INR 1.1    EXAM General - Patient is Alert, Appropriate and Oriented Extremity - Neurovascular intact Sensation intact distally Intact pulses distally Dorsiflexion/Plantar flexion intact No cellulitis present Compartment soft Dressing - dressing C/D/I and scant drainage Motor Function - intact, moving foot and toes well on exam.   Past Medical History:  Diagnosis Date  . Anxiety    takes lexapro  . Chronic kidney disease    patient states he was told he has 'moderate kidney disease'  . Degenerative disc disease, lumbar   . Exposure to hepatitis C   . GERD (gastroesophageal reflux disease)    takes OTC as needed - omeprazole  . Headache    on occasion  . History of gallstones   . Hypertension    diet controlled  . Pneumonia approx 15 years ago     Assessment/Plan:   2 Days Post-Op Procedure(s) (LRB): INTRAMEDULLARY (IM) NAIL INTERTROCHANTRIC (Left) Active Problems:   Closed left hip fracture (HCC)  Estimated body mass index is 23.11 kg/m as calculated from the following:   Height as of this encounter: 5\' 9"  (1.753 m).   Weight as of this encounter: 71 kg. Advance diet Up with therapy  Needs bowel movement Labs and vital signs are stable Pain well controlled Care management to assist with discharge to skilled nursing facility  DVT Prophylaxis - Lovenox, Foot Pumps and TED hose Weight-Bearing as tolerated to left leg   T. , PA-C [facility] 01/16/2020, 8:27 AM

## 2020-01-16 NOTE — TOC Initial Note (Signed)
Transition of Care [facility]) - Initial/Assessment Note    Patient Details  Name: Chad [name] MRN: [id number] Date of Birth: 08-24-1950  Transition of Care [facility]) CM/SW Contact:    Shelbie Ammons, RN Phone Number: 01/16/2020, 9:30 AM  Clinical Narrative:     RNCM met with patient at bedside. Patient reports to feeling ok this morning. Patient reports that he was just discharged from [facility] a few days before his fall.   Patient reports that he understands he needs to go to rehab when he leaves the hospital and reports that he lives at home alone and thinks that is a good idea.  Patient reports he has been to [facility] as well. Patient is agreeable to a bed search of all area facilities.  RNCM completed FL-2, verified PASSR is still good and sent out bed search.   Expected Discharge Plan: Skilled Nursing Facility Barriers to Discharge: No Barriers Identified   Patient Goals and CMS Choice   CMS Medicare.gov Compare Post Acute Care list provided to:: Patient Choice offered to / list presented to : Patient  Expected Discharge Plan and Services Expected Discharge Plan: [place] Choice: [place] Living arrangements for the past 2 months: Apartment                                      Prior Living Arrangements/Services Living arrangements for the past 2 months: Apartment Lives with:: Self Patient language and need for interpreter reviewed:: Yes Do you feel safe going back to the place where you live?: Yes      Need for Family Participation in Patient Care: Yes (Comment) Care giver support system in place?: Yes (comment)   Criminal Activity/Legal Involvement Pertinent to Current Situation/Hospitalization: No - Comment as needed  Activities of Daily Living Home Assistive Devices/Equipment: Cane (specify quad or straight) ADL Screening (condition at time of admission) Patient's cognitive ability adequate to safely  complete daily activities?: Yes Is the patient deaf or have difficulty hearing?: No Does the patient have difficulty seeing, even when wearing glasses/contacts?: No Does the patient have difficulty concentrating, remembering, or making decisions?: No Patient able to express need for assistance with ADLs?: Yes Does the patient have difficulty dressing or bathing?: No Independently performs ADLs?: Yes (appropriate for developmental age) Does the patient have difficulty walking or climbing stairs?: No Weakness of Legs: Left Weakness of Arms/Hands: None  Permission Sought/Granted Permission sought to share information with : Facility Sport and exercise psychologist                Emotional Assessment Appearance:: Appears stated age Attitude/Demeanor/Rapport: Engaged Affect (typically observed): Accepting, Calm Orientation: : Oriented to Self, Oriented to Place, Oriented to  Time, Oriented to Situation Alcohol / Substance Use: Alcohol Use Psych Involvement: No (comment)  Admission diagnosis:  Hip fracture ([place]) [S72.009A] Closed left hip fracture (HCC) [S72.002A] Closed fracture of left hip, initial encounter ([place]) [S72.002A] Fall, initial encounter [W19.XXXA] Alcohol withdrawal syndrome with complication [facility]) [X54.008] Patient Active Problem List   Diagnosis Date Noted  . Closed left hip fracture ([place]) 01/14/2020  . Calculus of gallbladder with acute cholecystitis without obstruction   . GI bleed 12/31/2019  . GIB (gastrointestinal bleeding) 12/31/2019  . Epigastric pain   . Alcohol abuse   . Chest pain   . Cough   . Tobacco abuse   . Alcohol  dependence with uncomplicated withdrawal ([place]) 01/23/2019  . Alcohol-induced mood disorder ([place]) 01/23/2019  . Anxiety and depression 01/23/2019  . HTN (hypertension) 05/17/2018  . Alcohol withdrawal ([place]) 05/17/2018  . DVT (deep venous thrombosis) ([place]) 05/17/2018  . 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 Lumbar stenosis with neurogenic claudication  06/07/2015  . BACK PAIN, LUMBAR 11/10/2006   PCP:  Patient, No Pcp Per Pharmacy:   [id number] [place] [id number]: [phone]: [id number]: [phone]: [phone]    Social Determinants of Health (SDOH) Interventions    Readmission Risk Interventions No flowsheet data found.

## 2020-01-17 NOTE — Progress Notes (Signed)
PROGRESS NOTE    Chad [name]  NAT:[id number] DOB: 02/04/1951 DOA: 01/14/2020 PCP: Patient, No Pcp Per   Brief Narrative:  Chad [name]  is a 69 y.o. Caucasian male with a known history of hypertension, anxiety and depression, as well as ongoing alcohol abuse, who presented to the emergency room with acute onset left hip pain after having an accidental mechanical fall.  He was going to the bathroom apparently lost balance when he fell on his left side.  Denies any presyncope or syncope headache dizziness or blurred vision, chest pain or dyspnea or palpitations.  No paresthesias or focal muscle weakness.  No nausea or vomiting or abdominal pain.  No dysuria, oliguria or hematuria or flank pain.  His last alcoholic drink was at 2 PM.  He usually drinks 2-3 mixed drinks.  He was noted to be shaking the emergency room.  Upon presentation to the emergency room, temperature was 96.6 with otherwise normal vital signs.  Labs revealed unremarkable BMP and CBC.  INR was 1.1.  Noncontrasted CT scan revealed no acute intracranial normalities.EKG showed normal sinus rhythm with a rate of 65 with poor R wave progression and low voltage QRS with T wave inversion in V1 and V2.  Chest x-ray showed shallow inspiration with no acute cardiopulmonary disease.  Left hip x-ray showed nondisplaced intertrochanteric left hip fracture.  The patient was given 50 mcg of IV fentanyl and 4 mg IV Zofran.  He was placed on CIWA protocol.  He will be admitted to a medical monitored bed for further evaluation and management.  Assessment & Plan:   Active Problems: Closed left hip fracture [facility]): Pt is s/p intramedullary nail per orthopedic. Pain management prn. Stable no c/o.   Anemia: Attribute to operative blood loss.  Alcohol abuse: Cont CIWA protocol. Thiamine daily.  Delirium: Sitter at bedside.    DVT prophylaxis: lovenox Code Status: full Family Communication: none at bedside. Disposition Plan:  STR.  Status is: Inpatient  Remains inpatient appropriate because:Inpatient level of care appropriate due to severity of illness   Dispo: The patient is from: Home              Anticipated d/c is to: Home              Anticipated d/c date is: 3 days              Patient currently is not medically stable to d/c.  Consultants:   Orthopedic: Hip FX left intertrochanteric  Procedures:  Hip FX left intertrochanteric.  Antimicrobials:  Anti-infectives (From admission, onward)   Start     Dose/Rate Route Frequency Ordered Stop   01/14/20 2000  ceFAZolin (ANCEF) IVPB 2g/100 mL premix        2 g 200 mL/hr over 30 Minutes Intravenous Every 6 hours 01/14/20 1748 01/15/20 0847   01/14/20 1100  ceFAZolin (ANCEF) IVPB 2g/100 mL premix        2 g 200 mL/hr over 30 Minutes Intravenous  Once 01/14/20 0735 01/14/20 1424     Subjective: Pt is alert and denies any complaints.  vitals and labs are stable.  Pt seen today he is eating breakfast. Labs are stable.  Pt is stable and has had difficulty with BM. Pt is stable today denies any complaints. BP have been stable . Continue alcohol withdrawal precaution.   Objective: Vitals:   01/16/20 0738 01/16/20 1552 01/16/20 2346 01/17/20 0748  BP: 123/80 108/63 (!) 102/58 (!) 160/85  Pulse: 83 73 80 94  Resp: 17 17 18 18   Temp: 98.7 F (37.1 C) 98.2 F (36.8 C) 98.4 F (36.9 C) 97.9 F (36.6 C)  TempSrc:   Oral Oral  SpO2: 95% 96% 95% 95%  Weight:      Height:        Intake/Output Summary (Last 24 hours) at 01/17/2020 1345 Last data filed at 01/17/2020 1246 Gross per 24 hour  Intake 842.45 ml  Output 1150 ml  Net -307.55 ml   Filed Weights   01/13/20 2104  Weight: 71 kg    Examination: Blood pressure (!) 160/85, pulse 94, temperature 97.9 F (36.6 C), temperature source Oral, resp. rate 18, height 5\' 9"  (1.753 m), weight 71 kg, SpO2 95 %.  General exam: Appears calm and comfortable  Respiratory system: Clear to  auscultation. Respiratory effort normal. Cardiovascular system: S1 & S2 heard, RRR. No JVD, murmurs, rubs, gallops or clicks. No pedal edema. Gastrointestinal system: Abdomen is nondistended, soft and nontender. No organomegaly or masses felt. Normal bowel sounds heard. Central nervous system: Alert and oriented. No focal neurological deficits. Extremities: pt moving all ext and pulses intact.  Skin: No rashes, lesions or ulcers Psychiatry: Judgement and insight appear normal. Mood & affect appropriate.   Data Reviewed: I have personally reviewed following labs and imaging studies  I/O last 3 completed shifts: In: 2816.2 [P.O.:120; I.V.:2696.2] Out: 1850 [Urine:1850] Total I/O In: -  Out: 300 [Urine:300] Lab Results  Component Value Date   CREATININE 0.56 (L) 01/15/2020   CREATININE 0.73 01/14/2020   CREATININE 0.74 01/13/2020   CBC: Recent Labs  Lab 01/13/20 2118 01/14/20 0415 01/15/20 0425  WBC 7.1 9.0 10.2  NEUTROABS 4.1  --   --   HGB 13.8 11.8* 10.8*  HCT 39.5 34.8* 32.6*  MCV 90.0 89.7 92.6  PLT 340 299 244   Basic Metabolic Panel: Recent Labs  Lab 01/13/20 2118 01/14/20 0415 01/15/20 0425  NA 141 141 135  K 4.0 4.1 3.8  CL 104 103 101  CO2 24 28 27   GLUCOSE 107* 86 105*  BUN 9 11 8   CREATININE 0.74 0.73 0.56*  CALCIUM 9.0 8.6* 8.2*  Coagulation Profile: Recent Labs  Lab 01/14/20 0146  INR 1.1    Recent Results (from the past 240 hour(s))  Respiratory Panel by RT PCR (Flu A&B, Covid) - Nasopharyngeal Swab     Status: None   Collection Time: 01/14/20  2:52 AM   Specimen: Nasopharyngeal Swab  Result Value Ref Range Status   SARS Coronavirus 2 by RT PCR NEGATIVE NEGATIVE Final    Comment: (NOTE) SARS-CoV-2 target nucleic acids are NOT DETECTED.  The SARS-CoV-2 RNA is generally detectable in upper respiratoy specimens during the acute phase of infection. The lowest concentration of SARS-CoV-2 viral copies this assay can detect is 131 copies/mL. A  negative result does not preclude SARS-Cov-2 infection and should not be used as the sole basis for treatment or other patient management decisions. A negative result may occur with  improper specimen collection/handling, submission of specimen other than nasopharyngeal swab, presence of viral mutation(s) within the areas targeted by this assay, and inadequate number of viral copies (<131 copies/mL). A negative result must be combined with clinical observations, patient history, and epidemiological information. The expected result is Negative.  Fact Sheet for Patients:   Fact Sheet for Healthcare Providers:   This test is no t yet approved or cleared by the 01/16/20 FDA and  has been authorized for detection and/or diagnosis  of SARS-CoV-2 by FDA under an Emergency Use Authorization (EUA). This EUA will remain  in effect (meaning this test can be used) for the duration of the COVID-19 declaration under Section 564(b)(1) of the Act, 21 U.S.C. section 360bbb-3(b)(1), unless the authorization is terminated or revoked sooner.     Influenza A by PCR NEGATIVE NEGATIVE Final   Influenza B by PCR NEGATIVE NEGATIVE Final    Comment: (NOTE) The Xpert Xpress SARS-CoV-2/FLU/RSV assay is intended as an aid in  the diagnosis of influenza from Nasopharyngeal swab specimens and  should not be used as a sole basis for treatment. Nasal washings and  aspirates are unacceptable for Xpert Xpress SARS-CoV-2/FLU/RSV  testing.  Fact Sheet for Patients: https://www.moore.com/  Fact Sheet for Healthcare Providers: https://www.young.biz/  This test is not yet approved or cleared by the [place] FDA and  has been authorized for detection and/or diagnosis of SARS-CoV-2 by  FDA under an Emergency Use Authorization (EUA). This EUA will remain  in effect (meaning this test can  be used) for the duration of the  Covid-19 declaration under Section 564(b)(1) of the Act, 21  U.S.C. section 360bbb-3(b)(1), unless the authorization is  terminated or revoked. Performed at [facility], [address] [place], [place] [address]      Radiology Studies: No results found.  Scheduled Meds:  amLODipine  5 mg Oral QPM   docusate sodium  100 mg Oral BID   enoxaparin (LOVENOX) injection  40 mg Subcutaneous Q24H   escitalopram  20 mg Oral Daily   feeding supplement (ENSURE ENLIVE)  237 mL Oral BID BM   folic acid  1 mg Oral Daily   LORazepam  0-4 mg Intravenous Q12H   Or   LORazepam  0-4 mg Oral Q12H   nicotine  21 mg Transdermal Daily   pantoprazole  40 mg Oral BID   thiamine  100 mg Oral Daily   Or   thiamine  100 mg Intravenous Daily   traMADol  50 mg Oral Q6H   traZODone  100 mg Oral QHS   Continuous Infusions:  sodium chloride 75 mL/hr at 01/16/20 1950   methocarbamol (ROBAXIN) IV       LOS: 3 days    Gertha Calkin, MD [facility] Pager [phone] If 7PM-7AM, please contact night-coverage [email or web address] Password [email or web address] 01/17/2020, 1:45 PM

## 2020-01-17 NOTE — Progress Notes (Signed)
Occupational Therapy Treatment Patient Details Name: Chad [name] MRN: [id number] DOB: [birth year] Today's Date: 01/17/2020    History of present illness Per MD note:Chad [name]  is a 69 y.o. Caucasian male with a known history of hypertension, anxiety and depression, as well as ongoing alcohol abuse, who presented to the emergency room with acute onset left hip pain after having an accidental mechanical fall.  He was going to the bathroom apparently lost balance when he fell on his left side.  Denies any presyncope or syncope headache dizziness or blurred vision, chest pain or dyspnea or palpitations.  No paresthesias or focal muscle weakness.  No nausea or vomiting or abdominal pain.  No dysuria, oliguria or hematuria or flank pain.  His last alcoholic drink was at 2 PM.  He usually drinks 2-3 mixed drinks.  He was noted to be shaking the emergency room.   OT comments  Pt seen for OT tx this date. Pt in recliner and agreeable to therapy despite 8/10 pain in L hip. OT instructed pt in DME for tub/shower to improve safety/indep with tub transfers and AE for LB ADL tasks to minimize discomfort/pain with hip flexion; pt verbalized understanding. Pt tolerated standing with LUE support on RW and CGA for approx 2 min to brush his teeth after set up for elevated tray table and grooming items placed on tray. Cues for placement to support decreased vision 2/2 broken glasses. Pt with improved functional ability despite pain for ADL tasks and decreased assist required for ADL transfers. Pt continues to benefit from skilled OT services; continue to recommend SNF at this time for continued therapy.    Follow Up Recommendations  SNF    Equipment Recommendations  3 in 1 bedside commode;Other (comment);Tub/shower bench (reacher, LH sponge, LH shoe horn)    Recommendations for Other Services      Precautions / Restrictions Precautions Precautions: Fall Precaution Comments: poor safty  awareness Restrictions Weight Bearing Restrictions: Yes LLE Weight Bearing: Weight bearing as tolerated Other Position/Activity Restrictions: L LE WBAT       Mobility Bed Mobility Overal bed mobility: Needs Assistance Bed Mobility: Sit to Supine     Supine to sit: Mod assist;Max assist Sit to supine: Mod assist   General bed mobility comments: pt was able to achieve EOB short sit with increased time and mod/max of one. pain most limiting. Is able to actively progress LEs to EOB but due to pain required HHA for required alot of assist  Transfers Overall transfer level: Needs assistance Equipment used: Rolling walker (2 wheeled) Transfers: Sit to/from Stand Sit to Stand: Min assist;Mod assist         General transfer comment: Min-Mod A from recliner to stand with significant effort from pt    Balance Overall balance assessment: Needs assistance Sitting-balance support: Feet supported Sitting balance-Leahy Scale: Good Sitting balance - Comments: no LOB sittined EOB with feet support only. does continue to present with tremors but did not affect balance this date   Standing balance support: Bilateral upper extremity supported;During functional activity;Single extremity supported Standing balance-Leahy Scale: Fair Standing balance comment: able to use LUE support on RW to brush his teeth in standing with R hand                           ADL either performed or assessed with clinical judgement   ADL Overall ADL's : Needs assistance/impaired     Grooming: Oral care;Standing;Min guard;Set up Grooming  Details (indicate cue type and reason): Pt tolerated standing with LUE support on RW and CGA for approx 2 min to brush his teeth after set up for elevated tray table and grooming items placed on tray. Cues for placement to support decreased vision 2/2 broken glasses                                     Vision Baseline Vision/History: Wears  glasses Wears Glasses: At all times Patient Visual Report: No change from baseline Additional Comments: glasses broken with only R lens in place   Perception     Praxis      Cognition Arousal/Alertness: Awake/alert Behavior During Therapy: WFL for tasks assessed/performed Overall Cognitive Status: No family/caregiver present to determine baseline cognitive functioning                                 General Comments: poor insight into deficits and safety awareness        Exercises Other Exercises Other Exercises: OT instructed pt in DME for tub/shower to improve safety/indep with tub transfers and AE for LB ADL tasks to minimize discomfort/pain with hip flexion; pt verbalized understanding   Shoulder Instructions       General Comments      Pertinent Vitals/ Pain       Pain Assessment: 0-10 Pain Score: 8  Faces Pain Scale: Hurts even more Pain Location: L hip while sitting up in recliner; 2/10 after back to bed in supine Pain Descriptors / Indicators: Grimacing;Guarding;Constant;Sore Pain Intervention(s): Limited activity within patient's tolerance;Monitored during session;Premedicated before session;Repositioned  Home Living                                          Prior Functioning/Environment              Frequency  Min 2X/week        Progress Toward Goals  OT Goals(current goals can now be found in the care plan section)  Progress towards OT goals: Progressing toward goals  Acute Rehab OT Goals Patient Stated Goal: be able to walk better OT Goal Formulation: With patient Time For Goal Achievement: 01/29/20 Potential to Achieve Goals: Good  Plan Discharge plan remains appropriate;Frequency remains appropriate    Co-evaluation        PT goals addressed during session: Mobility/safety with mobility;Balance;Strengthening/ROM;Proper use of DME        AM-PAC OT "6 Clicks" Daily Activity     Outcome Measure    Help from another person eating meals?: A Little Help from another person taking care of personal grooming?: A Little Help from another person toileting, which includes using toliet, bedpan, or urinal?: A Lot Help from another person bathing (including washing, rinsing, drying)?: A Lot Help from another person to put on and taking off regular upper body clothing?: A Little Help from another person to put on and taking off regular lower body clothing?: A Lot 6 Click Score: 15    End of Session Equipment Utilized During Treatment: Gait belt;Rolling walker  OT Visit Diagnosis: Other abnormalities of gait and mobility (R26.89);Muscle weakness (generalized) (M62.81);History of falling (Z91.81);Pain;Repeated falls (R29.6) Pain - Right/Left: Left Pain - part of body: Hip   Activity Tolerance Patient tolerated treatment  well   Patient Left in bed;with call bell/phone within reach;with bed alarm set   Nurse Communication          Time: 1041-1100 OT Time Calculation (min): 19 min  Charges: OT General Charges $OT Visit: 1 Visit OT Treatments $Self Care/Home Management : 8-22 mins  Richrd Prime, MPH, MS, OTR/L ascom [phone] 01/17/20, 11:13 AM

## 2020-01-17 NOTE — Care Management Important Message (Signed)
Important Message  Patient Details  Name: Crit Obremski MRN: [id number] Date of Birth: 1950/07/03   Medicare Important Message Given:  Yes     [name] 01/17/2020, 11:01 AM

## 2020-01-17 NOTE — Progress Notes (Signed)
   Subjective: 3 Days Post-Op Procedure(s) (LRB): INTRAMEDULLARY (IM) NAIL INTERTROCHANTRIC (Left) Patient reports pain as mild.   Patient is well, and has had no acute complaints or problems Denies any CP, SOB, ABD pain. We will continue with physical therapy today.   Objective: Vital signs in last 24 hours: Temp:  [98.2 F (36.8 C)-98.7 F (37.1 C)] 98.4 F (36.9 C) (09/27 2346) Pulse Rate:  [73-83] 80 (09/27 2346) Resp:  [17-18] 18 (09/27 2346) BP: (102-123)/(58-80) 102/58 (09/27 2346) SpO2:  [95 %-96 %] 95 % (09/27 2346)  Intake/Output from previous day: 09/27 0701 - 09/28 0700 In: 962.5 [P.O.:120; I.V.:842.5] Out: 1150 [Urine:1150] Intake/Output this shift: Total I/O In: -  Out: 650 [Urine:650]  Recent Labs    01/15/20 0425  HGB 10.8*   Recent Labs    01/15/20 0425  WBC 10.2  RBC 3.52*  HCT 32.6*  PLT 244   Recent Labs    01/15/20 0425  NA 135  K 3.8  CL 101  CO2 27  BUN 8  CREATININE 0.56*  GLUCOSE 105*  CALCIUM 8.2*   No results for input(s): LABPT, INR in the last 72 hours.  EXAM General - Patient is Alert, Appropriate and Oriented Extremity - Neurovascular intact Sensation intact distally Intact pulses distally Dorsiflexion/Plantar flexion intact No cellulitis present Compartment soft Dressing - dressing C/D/I and scant drainage Motor Function - intact, moving foot and toes well on exam.   Past Medical History:  Diagnosis Date  . Anxiety    takes lexapro  . Chronic kidney disease    patient states he was told he has 'moderate kidney disease'  . Degenerative disc disease, lumbar   . Exposure to hepatitis C   . GERD (gastroesophageal reflux disease)    takes OTC as needed - omeprazole  . Headache    on occasion  . History of gallstones   . Hypertension    diet controlled  . Pneumonia approx 15 years ago    Assessment/Plan:   3 Days Post-Op Procedure(s) (LRB): INTRAMEDULLARY (IM) NAIL INTERTROCHANTRIC (Left) Active  Problems:   Closed left hip fracture (HCC)  Estimated body mass index is 23.11 kg/m as calculated from the following:   Height as of this encounter: 5\' 9"  (1.753 m).   Weight as of this encounter: 71 kg. Advance diet Up with therapy  vital signs are stable Pain well controlled Care management to assist with discharge to skilled nursing facility  DVT Prophylaxis - Lovenox, Foot Pumps and TED hose Weight-Bearing as tolerated to left leg   T. , PA-C [facility] [place] [facility] 01/17/2020, 6:51 AM

## 2020-01-17 NOTE — Progress Notes (Signed)
Physical Therapy Treatment Patient Details Name: Chad [name] MRN: [id number] DOB: [birth year] Today's Date: 01/17/2020    History of Present Illness Per MD note:Rollen Pineda  is a 69 y.o. Caucasian male with a known history of hypertension, anxiety and depression, as well as ongoing alcohol abuse, who presented to the emergency room with acute onset left hip pain after having an accidental mechanical fall.  He was going to the bathroom apparently lost balance when he fell on his left side.  Denies any presyncope or syncope headache dizziness or blurred vision, chest pain or dyspnea or palpitations.  No paresthesias or focal muscle weakness.  No nausea or vomiting or abdominal pain.  No dysuria, oliguria or hematuria or flank pain.  His last alcoholic drink was at 2 PM.  He usually drinks 2-3 mixed drinks.  He was noted to be shaking the emergency room.    PT Comments    Pt was supine in bed upon arriving. He agrees to PT session and OOB activity however c/o severe Hip pain 8/10. Elevated to 10/10 during gait training. He was able to exit R sid eof bed with mod-max assist of one. Pt very slow moving 2/2 to pain. Moans with most movements. He stood from elevated bed height to RW with mod assist and was  Able to ambulate 25 ft in room without LOB. CVery slow step to antalgic gait pattern. Pt reports he has 16 stairs to enter apartment. Lengthy discussion about recommendation of rehab at [place]. Pt is in agreement. Will continue to follow pt per POC and return later this afternoon for PM session. Will benefit from SNF at [place] to address deficits and improve Independence.    Follow Up Recommendations  SNF     Equipment Recommendations  Rolling walker with 5" wheels    Recommendations for Other Services       Precautions / Restrictions Precautions Precautions: Fall Precaution Comments: poor safty awareness Restrictions Weight Bearing Restrictions: Yes LLE Weight Bearing: Weight bearing as  tolerated    Mobility  Bed Mobility Overal bed mobility: Needs Assistance Bed Mobility: Supine to Sit     Supine to sit: Mod assist;Max assist     General bed mobility comments: pt was able to achieve EOB short sit with increased time and mod/max of one. pain most limiting. Is able to actively progress LEs to EOB but due to pain required HHA for required alot of assist  Transfers Overall transfer level: Needs assistance Equipment used: Rolling walker (2 wheeled) Transfers: Sit to/from Stand Sit to Stand: From elevated surface;Mod assist         General transfer comment: Mod assist to stand from EOB. Vcs for hand placement and technique improvements. mod of one to safely stand from slightly raised bed height  Ambulation/Gait Ambulation/Gait assistance: Min assist Gait Distance (Feet): 25 Feet Assistive device: Rolling walker (2 wheeled) Gait Pattern/deviations: Step-to pattern;Trunk flexed;Narrow base of support Gait velocity: decreased   General Gait Details: Pt has very antalgic step to gait pattern. vcs throughout for posture correction and overall improve gait kinematics. poor carry-over. limited distance 2/2 to pain and fatigue. HR elevated from 88 to 115bpm during ambulation.   Stairs Stairs:  (pt reports he has 16 stairs to enter apartment)            Balance Overall balance assessment: Needs assistance Sitting-balance support: Feet supported Sitting balance-Leahy Scale: Good Sitting balance - Comments: no LOB sittined EOB with feet support only. does continue to present with  tremors but did not affect balance this date   Standing balance support: Bilateral upper extremity supported;During functional activity Standing balance-Leahy Scale: Fair Standing balance comment: heavy use of RW, limited by pain, requires MIN A during dynamic balance activities. CGA for static standing with BUE support on RW           Cognition Arousal/Alertness:  Awake/alert Behavior During Therapy: WFL for tasks assessed/performed Overall Cognitive Status: No family/caregiver present to determine baseline cognitive functioning              General Comments: Pt is A and O x 4 but has poor insight of deficits. Lengthy discussion about need for rehab at [place]. He is agreeable.             Pertinent Vitals/Pain Pain Assessment: 0-10 Pain Score: 8  Faces Pain Scale: Hurts even more Pain Location: L hip Pain Descriptors / Indicators: Grimacing;Guarding;Constant;Sore Pain Intervention(s): Limited activity within patient's tolerance;Monitored during session;Premedicated before session;Repositioned;Ice applied           PT Goals (current goals can now be found in the care plan section) Acute Rehab PT Goals Patient Stated Goal: be able to walk better Progress towards PT goals: Progressing toward goals    Frequency    BID      PT Plan Current plan remains appropriate    Co-evaluation     PT goals addressed during session: Mobility/safety with mobility;Balance;Strengthening/ROM;Proper use of DME        AM-PAC PT "6 Clicks" Mobility   Outcome Measure  Help needed turning from your back to your side while in a flat bed without using bedrails?: A Little Help needed moving from lying on your back to sitting on the side of a flat bed without using bedrails?: A Lot Help needed moving to and from a bed to a chair (including a wheelchair)?: A Lot Help needed standing up from a chair using your arms (e.g., wheelchair or bedside chair)?: A Lot Help needed to walk in hospital room?: A Lot Help needed climbing 3-5 steps with a railing? : A Lot 6 Click Score: 13    End of Session Equipment Utilized During Treatment: Gait belt Activity Tolerance: Patient limited by pain;Patient limited by fatigue Patient left: in chair;with call bell/phone within reach;with chair alarm set Nurse Communication: Mobility status;Precautions PT Visit  Diagnosis: Other abnormalities of gait and mobility (R26.89);Muscle weakness (generalized) (M62.81);Difficulty in walking, not elsewhere classified (R26.2)     Time: 6967-8938 PT Time Calculation (min) (ACUTE ONLY): 19 min  Charges:  $Gait Training: 8-22 mins                     Jetta Lout PTA 01/17/20, 9:55 AM

## 2020-01-17 NOTE — Progress Notes (Signed)
PT Cancellation Note  Patient Details Name: Chad [name] MRN: [id number] DOB: 08/02/1950   Cancelled Treatment:     PT attempt. 2 attempt this afternoon. Pt refused, 2/2 to pain. Will return tomorrow and continue to follow pt per POC.    Rushie Chestnut 01/17/2020, 3:30 PM

## 2020-01-18 DIAGNOSIS — F419 Anxiety disorder, unspecified: Secondary | ICD-10-CM

## 2020-01-18 DIAGNOSIS — D649 Anemia, unspecified: Secondary | ICD-10-CM

## 2020-01-18 DIAGNOSIS — F329 Major depressive disorder, single episode, unspecified: Secondary | ICD-10-CM

## 2020-01-18 DIAGNOSIS — S72002D Fracture of unspecified part of neck of left femur, subsequent encounter for closed fracture with routine healing: Secondary | ICD-10-CM

## 2020-01-18 MED ORDER — HYDROCODONE-ACETAMINOPHEN 7.5-325 MG PO TABS
1.0000 | ORAL_TABLET | ORAL | 0 refills | Status: DC | PRN
Start: 2020-01-18 — End: 2020-02-04

## 2020-01-18 MED ORDER — ENOXAPARIN SODIUM 40 MG/0.4ML ~~LOC~~ SOLN: 40.0000 mg | SUBCUTANEOUS | 0 refills | Status: DC

## 2020-01-18 MED ORDER — HYDROCODONE-ACETAMINOPHEN 7.5-325 MG PO TABS: 1.0000 | ORAL_TABLET | ORAL | 0 refills | Status: DC | PRN

## 2020-01-18 NOTE — Discharge Instructions (Signed)

## 2020-01-18 NOTE — Progress Notes (Signed)
   Subjective: 4 Days Post-Op Procedure(s) (LRB): INTRAMEDULLARY (IM) NAIL INTERTROCHANTRIC (Left) Patient reports pain as mild.   Patient is well, and has had no acute complaints or problems Denies any CP, SOB, ABD pain. We will continue with physical therapy today.  Patient ambulated 25 feet yesterday with PT.  Making slow progress.  Objective: Vital signs in last 24 hours: Temp:  [98 F (36.7 C)-98.6 F (37 C)] 98.6 F (37 C) (09/29 0737) Pulse Rate:  [77-81] 77 (09/29 0737) Resp:  [16-20] 17 (09/29 0737) BP: (115-146)/(73-92) 118/92 (09/29 0737) SpO2:  [94 %-100 %] 94 % (09/29 0737)  Intake/Output from previous day: 09/28 0701 - 09/29 0700 In: 480 [P.O.:480] Out: 1775 [Urine:1775] Intake/Output this shift: No intake/output data recorded.  No results for input(s): HGB in the last 72 hours. No results for input(s): WBC, RBC, HCT, PLT in the last 72 hours. No results for input(s): NA, K, CL, CO2, BUN, CREATININE, GLUCOSE, CALCIUM in the last 72 hours. No results for input(s): LABPT, INR in the last 72 hours.  EXAM General - Patient is Alert, Appropriate and Oriented Extremity - Neurovascular intact Sensation intact distally Intact pulses distally Dorsiflexion/Plantar flexion intact No cellulitis present Compartment soft Dressing - dressing C/D/I and scant drainage Motor Function - intact, moving foot and toes well on exam.   Past Medical History:  Diagnosis Date  . Anxiety    takes lexapro  . Chronic kidney disease    patient states he was told he has 'moderate kidney disease'  . Degenerative disc disease, lumbar   . Exposure to hepatitis C   . GERD (gastroesophageal reflux disease)    takes OTC as needed - omeprazole  . Headache    on occasion  . History of gallstones   . Hypertension    diet controlled  . Pneumonia approx 15 years ago    Assessment/Plan:   4 Days Post-Op Procedure(s) (LRB): INTRAMEDULLARY (IM) NAIL INTERTROCHANTRIC (Left) Active  Problems:   Closed left hip fracture (HCC)  Estimated body mass index is 23.11 kg/m as calculated from the following:   Height as of this encounter: 5\' 9"  (1.753 m).   Weight as of this encounter: 71 kg. Advance diet Up with therapy  vital signs are stable Pain well controlled Care management to assist with discharge to skilled nursing facility today   Follow-up with [facility] orthopedics in 2 weeks TED hose bilateral lower extremity x6 weeks Lovenox 40 mg subcu daily x14 days  DVT Prophylaxis - Lovenox, Foot Pumps and TED hose Weight-Bearing as tolerated to left leg   [facility], PA-C [facility] 01/18/2020, 7:51 AM

## 2020-01-18 NOTE — TOC Progression Note (Signed)
Transition of Care [facility]) - Progression Note    Patient Details  Name: Chad [name] MRN: [id number] Date of Birth: 1950/07/25  Transition of Care [facility]) CM/SW [place], RN Phone Number: 01/18/2020, 1:45 PM  Clinical Narrative:   RNCM met with patient at bedside, patient sitting up to side of bed and reports he is ready to leave. Patient is agreeable to [facility] and reports the sooner the better.  RNCM accepted bed in hub and notified facility so that they could accept insurance auth.     Expected Discharge Plan: [place] Barriers to Discharge: No Barriers Identified  Expected Discharge Plan and Services Expected Discharge Plan: [place] Choice: [place] Living arrangements for the past 2 months: Apartment                                       Social Determinants of Health (SDOH) Interventions    Readmission Risk Interventions No flowsheet data found.

## 2020-01-18 NOTE — TOC Progression Note (Signed)
Transition of Care [facility]) - Progression Note    Patient Details  Name: Pacen Watford MRN: [id number] Date of Birth: May 11, 1950  Transition of Care [facility]) CM/SW [place], RN Phone Number: 01/18/2020, 8:33 AM  Clinical Narrative:   RNCM met with patient at bedside to discuss discharge planning. Patient's only bed offer is [facility] and this was discussed. Patient reports that this would not be his first choice of places to go but he wants to think about it. Patient requesting for this RNCM to check back after lunch and he will have decision.     Expected Discharge Plan: [place] Barriers to Discharge: No Barriers Identified  Expected Discharge Plan and Services Expected Discharge Plan: [place] Choice: [place] Living arrangements for the past 2 months: Apartment                                       Social Determinants of Health (SDOH) Interventions    Readmission Risk Interventions No flowsheet data found.

## 2020-01-18 NOTE — Progress Notes (Signed)
Patient ID: Taesean Reth, male   DOB: 13-Mar-1951, 69 y.o.   MRN: [id number] [facility] PROGRESS NOTE  Brode Sculley NUU:[id number] DOB: Sep 09, 1950 DOA: 01/14/2020 PCP: Patient, No Pcp Per  HPI/Subjective: Patient with hip pain.  Unable to straight leg raise.  Still has some abdominal discomfort.  Not feeling well.  As of this morning has not excepted a rehab bed.  He states he cannot walk and he lives home alone.  Objective: Vitals:   01/17/20 2340 01/18/20 0737  BP: (!) 146/79 (!) 118/92  Pulse: 81 77  Resp: 16 17  Temp: 98.2 F (36.8 C) 98.6 F (37 C)  SpO2: 100% 94%    Intake/Output Summary (Last 24 hours) at 01/18/2020 1428 Last data filed at 01/18/2020 1355 Gross per 24 hour  Intake 480 ml  Output 1725 ml  Net -1245 ml   Filed Weights   01/13/20 2104  Weight: 71 kg    ROS: Review of Systems  Respiratory: Negative for cough and shortness of breath.   Cardiovascular: Negative for chest pain.  Gastrointestinal: Positive for abdominal pain. Negative for nausea and vomiting.  Musculoskeletal: Positive for joint pain.   Exam: Physical Exam HENT:     Head: Normocephalic.     Nose: No mucosal edema.     Mouth/Throat:     Pharynx: No oropharyngeal exudate.  Eyes:     General: Lids are normal.     Conjunctiva/sclera: Conjunctivae normal.     Pupils: Pupils are equal, round, and reactive to light.  Cardiovascular:     Rate and Rhythm: Normal rate and regular rhythm.     Heart sounds: Normal heart sounds, S1 normal and S2 normal.  Pulmonary:     Breath sounds: No decreased breath sounds, wheezing, rhonchi or rales.  Abdominal:     Palpations: Abdomen is soft.     Tenderness: There is no abdominal tenderness.  Musculoskeletal:     Right ankle: No swelling.     Left ankle: No swelling.  Skin:    General: Skin is warm.     Findings: No rash.  Neurological:     Mental Status: He is alert and oriented to person, place, and time.     Comments: Unable to  straight leg raise.       Data Reviewed: Basic Metabolic Panel: Recent Labs  Lab 01/13/20 2118 01/14/20 0415 01/15/20 0425  NA 141 141 135  K 4.0 4.1 3.8  CL 104 103 101  CO2 24 28 27   GLUCOSE 107* 86 105*  BUN 9 11 8   CREATININE 0.74 0.73 0.56*  CALCIUM 9.0 8.6* 8.2*   CBC: Recent Labs  Lab 01/13/20 2118 01/14/20 0415 01/15/20 0425  WBC 7.1 9.0 10.2  NEUTROABS 4.1  --   --   HGB 13.8 11.8* 10.8*  HCT 39.5 34.8* 32.6*  MCV 90.0 89.7 92.6  PLT 340 299 244     Recent Results (from the past 240 hour(s))  Respiratory Panel by RT PCR (Flu A&B, Covid) - Nasopharyngeal Swab     Status: None   Collection Time: 01/14/20  2:52 AM   Specimen: Nasopharyngeal Swab  Result Value Ref Range Status   SARS Coronavirus 2 by RT PCR NEGATIVE NEGATIVE Final    Comment: (NOTE) SARS-CoV-2 target nucleic acids are NOT DETECTED.  The SARS-CoV-2 RNA is generally detectable in upper respiratoy specimens during the acute phase of infection. The lowest concentration of SARS-CoV-2 viral copies this assay can detect is 131 copies/mL. A negative  result does not preclude SARS-Cov-2 infection and should not be used as the sole basis for treatment or other patient management decisions. A negative result may occur with  improper specimen collection/handling, submission of specimen other than nasopharyngeal swab, presence of viral mutation(s) within the areas targeted by this assay, and inadequate number of viral copies (<131 copies/mL). A negative result must be combined with clinical observations, patient history, and epidemiological information. The expected result is Negative.  Fact Sheet for Patients:  https://www.moore.com/  Fact Sheet for Healthcare Providers:  https://www.young.biz/  This test is no t yet approved or cleared by the [place] FDA and  has been authorized for detection and/or diagnosis of SARS-CoV-2 by FDA under an  Emergency Use Authorization (EUA). This EUA will remain  in effect (meaning this test can be used) for the duration of the COVID-19 declaration under Section 564(b)(1) of the Act, 21 U.S.C. section 360bbb-3(b)(1), unless the authorization is terminated or revoked sooner.     Influenza A by PCR NEGATIVE NEGATIVE Final   Influenza B by PCR NEGATIVE NEGATIVE Final    Comment: (NOTE) The Xpert Xpress SARS-CoV-2/FLU/RSV assay is intended as an aid in  the diagnosis of influenza from Nasopharyngeal swab specimens and  should not be used as a sole basis for treatment. Nasal washings and  aspirates are unacceptable for Xpert Xpress SARS-CoV-2/FLU/RSV  testing.  Fact Sheet for Patients: https://www.moore.com/  Fact Sheet for Healthcare Providers: https://www.young.biz/  This test is not yet approved or cleared by the [place] FDA and  has been authorized for detection and/or diagnosis of SARS-CoV-2 by  FDA under an Emergency Use Authorization (EUA). This EUA will remain  in effect (meaning this test can be used) for the duration of the  Covid-19 declaration under Section 564(b)(1) of the Act, 21  U.S.C. section 360bbb-3(b)(1), unless the authorization is  terminated or revoked. Performed at [facility], [address] [place], [place] [address]      Scheduled Meds: . amLODipine  5 mg Oral QPM  . docusate sodium  100 mg Oral BID  . enoxaparin (LOVENOX) injection  40 mg Subcutaneous Q24H  . escitalopram  20 mg Oral Daily  . feeding supplement (ENSURE ENLIVE)  237 mL Oral BID BM  . folic acid  1 mg Oral Daily  . nicotine  21 mg Transdermal Daily  . pantoprazole  40 mg Oral BID  . thiamine  100 mg Oral Daily   Or  . thiamine  100 mg Intravenous Daily  . traMADol  50 mg Oral Q6H  . traZODone  100 mg Oral QHS   Continuous Infusions: . methocarbamol (ROBAXIN) IV      Assessment/Plan:  1. Closed left hip fracture requiring  operative repair.  This was done on the 25th.  Patient this afternoon accepted a rehab bed and we are looking into insurance authorization. 2. Anemia.  Continue to monitor check CBC tomorrow morning.  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 3. Alcohol abuse on thiamine and folic acid. 4. Delirium has resolved 5. Essential hypertension on Norvasc 6. Anxiety depression on Lexapro 7. Recent laparoscopic cholecystectomy.     Code Status:     Code Status Orders  (From admission, onward)         Start     Ordered   01/14/20 0245  Full code  Continuous        01/14/20 0247        Code Status History    Date  Active Date Inactive Code Status Order ID Comments User Context   01/14/2020 0144 01/14/2020 0247 Full Code [id number]  Nita Sickle, MD ED   12/31/2019 1631 01/06/2020 2346 Full Code [id number]  Gertha Calkin, MD ED   12/09/2019 1729 12/12/2019 1712 Full Code [id number]  Alford Highland, MD ED   02/20/2019 0944 02/20/2019 1734 Full Code [id number]  Chesley Noon, MD ED   01/23/2019 1648 01/25/2019 1957 Full Code [id number]  Mayo, Allyn Kenner, MD Inpatient   05/17/2018 2327 05/18/2018 1902 Full Code [id number]  Oralia Manis, MD Inpatient   05/17/2018 1950 05/17/2018 2327 Full Code [id number]  Nita Sickle, MD ED   12/24/2017 0803 12/25/2017 1645 Full Code [id number]  Arnaldo Natal, MD ED   06/07/2015 1619 06/08/2015 1754 Full Code [id number]  Shirlean Kelly, MD Inpatient   Advance Care Planning Activity     Disposition Plan: Status is: Inpatient  Dispo: The patient is from: Home              Anticipated d/c is to: Rehab              Anticipated d/c date is: 01/19/2020              Patient currently finally accepted bed to rehab and looking into insurance authorization.  Consultants:  Orthopedic surgery  Procedures:  Left hip repair  Time spent: 28 minutes  Aydrien Froman [employer]

## 2020-01-18 NOTE — Progress Notes (Signed)
Physical Therapy Treatment Patient Details Name: Chad [name] MRN: [id number] DOB: Nov 11, 1950 Today's Date: 01/18/2020    History of Present Illness Per MD note:Chad [name]  is a 69 y.o. Caucasian male with a known history of hypertension, anxiety and depression, as well as ongoing alcohol abuse, who presented to the emergency room with acute onset left hip pain after having an accidental mechanical fall.  He was going to the bathroom apparently lost balance when he fell on his left side.  Denies any presyncope or syncope headache dizziness or blurred vision, chest pain or dyspnea or palpitations.  No paresthesias or focal muscle weakness.  No nausea or vomiting or abdominal pain.  No dysuria, oliguria or hematuria or flank pain.  His last alcoholic drink was at 2 PM.  He usually drinks 2-3 mixed drinks.  He was noted to be shaking the emergency room.    PT Comments    Pt was supine in bed upon arriving. Agrees to Session with encouragement but overall much more cooperative and motivated. Pt agrees to OOB activity and ambulation into hallway. Pt continues to required assist mostyly 2/2 to pain. Was able to exit R side of bed with mod assist, stand to RW with min assist, and ambulate to RN station without LOB however has slow, step to, antalgic gait pattern. Pt will benefit from SNF at DC to address deficits and improve safe functional mobility. RN in room at conclusion of session.   Follow Up Recommendations  SNF     Equipment Recommendations  Other (comment) (defer to next level of care)    Recommendations for Other Services       Precautions / Restrictions Precautions Precautions: Fall Restrictions Weight Bearing Restrictions: Yes LLE Weight Bearing: Weight bearing as tolerated    Mobility  Bed Mobility Overal bed mobility: Needs Assistance Bed Mobility: Supine to Sit     Supine to sit: Mod assist Sit to supine: Mod assist   General bed mobility comments: Pt required mod  assist to exit R side of bed and mod assist to progress BLEs into bed  Transfers Overall transfer level: Needs assistance Equipment used: Rolling walker (2 wheeled) Transfers: Sit to/from Stand Sit to Stand: Min assist;From elevated surface         General transfer comment: Min assist to stand to RW.  Ambulation/Gait Ambulation/Gait assistance: Min assist;Min guard Gait Distance (Feet): 50 Feet Assistive device: Rolling walker (2 wheeled) Gait Pattern/deviations: Antalgic;Step-to pattern;Trunk flexed Gait velocity: decreased   General Gait Details: Pt was able to ambulate to RN station and return without LOB or unsteadiness.      Balance Overall balance assessment: Needs assistance Sitting-balance support: Feet supported;Bilateral upper extremity supported Sitting balance-Leahy Scale: Good     Standing balance support: Bilateral upper extremity supported;During functional activity;Single extremity supported Standing balance-Leahy Scale: Fair Standing balance comment: heavy reliance on RW        Cognition Arousal/Alertness: Awake/alert Behavior During Therapy: WFL for tasks assessed/performed Overall Cognitive Status: Within Functional Limits for tasks assessed          General Comments: Pt was A and O x 4 and cooperative throughout.        Pertinent Vitals/Pain Pain Assessment: 0-10 Pain Score: 4  Faces Pain Scale: Hurts little more Pain Location: Pt was more willing to participate. Pain Descriptors / Indicators: Grimacing;Guarding;Constant;Sore Pain Intervention(s): Limited activity within patient's tolerance;Monitored during session;Premedicated before session           PT Goals (current goals can  now be found in the care plan section) Acute Rehab PT Goals Patient Stated Goal: be able to walk better Progress towards PT goals: Progressing toward goals    Frequency    BID      PT Plan Current plan remains appropriate    Co-evaluation     PT  goals addressed during session: Balance;Mobility/safety with mobility;Proper use of DME;Strengthening/ROM        AM-PAC PT "6 Clicks" Mobility   Outcome Measure  Help needed turning from your back to your side while in a flat bed without using bedrails?: A Little Help needed moving from lying on your back to sitting on the side of a flat bed without using bedrails?: A Lot Help needed moving to and from a bed to a chair (including a wheelchair)?: A Lot Help needed standing up from a chair using your arms (e.g., wheelchair or bedside chair)?: A Lot Help needed to walk in hospital room?: A Lot Help needed climbing 3-5 steps with a railing? : A Lot 6 Click Score: 13    End of Session Equipment Utilized During Treatment: Gait belt Activity Tolerance: Patient tolerated treatment well;Patient limited by pain Patient left: in bed;with call bell/phone within reach;with bed alarm set;with nursing/sitter in room Nurse Communication: Mobility status;Precautions PT Visit Diagnosis: Other abnormalities of gait and mobility (R26.89);Muscle weakness (generalized) (M62.81);Difficulty in walking, not elsewhere classified (R26.2)     Time: 6761-9509 PT Time Calculation (min) (ACUTE ONLY): 23 min  Charges:  $Gait Training: 8-22 mins $Therapeutic Activity: 8-22 mins                     Jetta Lout PTA 01/18/20, 1:52 PM

## 2020-01-18 NOTE — Plan of Care (Signed)
  Problem: Education: Goal: Knowledge of General Education information will improve Description: Including pain rating scale, medication(s)/side effects and non-pharmacologic comfort measures Outcome: Progressing   Problem: Health Behavior/Discharge Planning: Goal: Ability to manage health-related needs will improve Outcome: Progressing   Problem: Clinical Measurements: Goal: Ability to maintain clinical measurements within normal limits will improve Outcome: Progressing Goal: Will remain free from infection Outcome: Progressing Goal: Diagnostic test results will improve Outcome: Progressing Goal: Respiratory complications will improve Outcome: Progressing Goal: Cardiovascular complication will be avoided Outcome: Progressing   Problem: Activity: Goal: Risk for activity intolerance will decrease Outcome: Progressing   Problem: Nutrition: Goal: Adequate nutrition will be maintained Outcome: Progressing   Problem: Coping: Goal: Level of anxiety will decrease Outcome: Progressing   Problem: Elimination: Goal: Will not experience complications related to bowel motility Outcome: Progressing Goal: Will not experience complications related to urinary retention Outcome: Progressing   Problem: Safety: Goal: Ability to remain free from injury will improve Outcome: Progressing   Problem: Skin Integrity: Goal: Risk for impaired skin integrity will decrease Outcome: Progressing   Problem: Education: Goal: Verbalization of understanding the information provided (i.e., activity precautions, restrictions, etc) will improve Outcome: Progressing Goal: Individualized Educational Video(s) Outcome: Progressing

## 2020-01-18 NOTE — Progress Notes (Signed)
Physical Therapy Treatment Patient Details Name: Chad [name] MRN: [id number] DOB: Sep 03, 1950 Today's Date: 01/18/2020    History of Present Illness Per MD note:Chad [name]  is a 69 y.o. Caucasian male with a known history of hypertension, anxiety and depression, as well as ongoing alcohol abuse, who presented to the emergency room with acute onset left hip pain after having an accidental mechanical fall.  He was going to the bathroom apparently lost balance when he fell on his left side.  Denies any presyncope or syncope headache dizziness or blurred vision, chest pain or dyspnea or palpitations.  No paresthesias or focal muscle weakness.  No nausea or vomiting or abdominal pain.  No dysuria, oliguria or hematuria or flank pain.  His last alcoholic drink was at 2 PM.  He usually drinks 2-3 mixed drinks.  He was noted to be shaking the emergency room.    PT Comments    Pt was supine in bed watching TV upon arriving. He agrees to OOB activity with encouragement. Did discuss the importance of going to STR at DC to improve safe functional mobility. He required mod assist to exit and re-enter bed. Stood with Min assist and ambulated 25 ft with RW with Min assist at first but able to progress to CGA only for safety. Still having step to antalgic gait kinematics but overall improved from previous date. PT will benefit from continued skilled PT at DC to address deficits and improve safe functional mobility. He was in bed at conclusion of session with call bell in reach and RN aware of pt's abilities.     Follow Up Recommendations  SNF     Equipment Recommendations  Other (comment) (defer to next level of care)    Recommendations for Other Services       Precautions / Restrictions Precautions Precautions: Fall Restrictions Weight Bearing Restrictions: Yes LLE Weight Bearing: Weight bearing as tolerated    Mobility  Bed Mobility Overal bed mobility: Needs Assistance Bed Mobility: Supine  to Sit     Supine to sit: Mod assist Sit to supine: Mod assist   General bed mobility comments: Pt required mod assist to exit and re-enter bed  Transfers Overall transfer level: Needs assistance Equipment used: Rolling walker (2 wheeled) Transfers: Sit to/from Stand Sit to Stand: Min assist;From elevated surface         General transfer comment: Pt was able to stand from slightly elevated bed height. poor eccenrric control. slight tremors during session but very minimal  Ambulation/Gait Ambulation/Gait assistance: Min assist;Min guard Gait Distance (Feet): 25 Feet Assistive device: Rolling walker (2 wheeled) Gait Pattern/deviations: Step-to pattern;Trunk flexed;Antalgic Gait velocity: decreased   General Gait Details: Pt was able to demonstarte slightly improve gait kinematics from previous date however continues to have limited gait distances 2/2 to pain.        Balance Overall balance assessment: Needs assistance Sitting-balance support: Feet supported;Bilateral upper extremity supported Sitting balance-Leahy Scale: Good     Standing balance support: Bilateral upper extremity supported;During functional activity;Single extremity supported Standing balance-Leahy Scale: Fair Standing balance comment: heavy reliance on RW        Cognition Arousal/Alertness: Awake/alert Behavior During Therapy: WFL for tasks assessed/performed Overall Cognitive Status: Within Functional Limits for tasks assessed          General Comments: Pt was A and O but slightly frustrated/aggitated about care. Is willing to go rehab to improve safe functional prior to DC home.  Pertinent Vitals/Pain Pain Assessment: 0-10 Pain Score: 8  Faces Pain Scale: Hurts little more Pain Location: Pt c/o pain throughout session on LLE.  Pain Descriptors / Indicators: Grimacing;Guarding;Constant;Sore Pain Intervention(s): Limited activity within patient's tolerance;Monitored during  session;Repositioned           PT Goals (current goals can now be found in the care plan section) Acute Rehab PT Goals Patient Stated Goal: be able to walk better Progress towards PT goals: Progressing toward goals    Frequency    BID      PT Plan Current plan remains appropriate    Co-evaluation     PT goals addressed during session: Mobility/safety with mobility;Proper use of DME;Strengthening/ROM;Balance        AM-PAC PT "6 Clicks" Mobility   Outcome Measure  Help needed turning from your back to your side while in a flat bed without using bedrails?: A Little Help needed moving from lying on your back to sitting on the side of a flat bed without using bedrails?: A Lot Help needed moving to and from a bed to a chair (including a wheelchair)?: A Lot Help needed standing up from a chair using your arms (e.g., wheelchair or bedside chair)?: A Lot Help needed to walk in hospital room?: A Lot Help needed climbing 3-5 steps with a railing? : A Lot 6 Click Score: 13    End of Session Equipment Utilized During Treatment: Gait belt Activity Tolerance: Patient tolerated treatment well;Patient limited by pain Patient left: in bed;with call bell/phone within reach;with bed alarm set;with nursing/sitter in room Nurse Communication: Mobility status;Precautions PT Visit Diagnosis: Other abnormalities of gait and mobility (R26.89);Muscle weakness (generalized) (M62.81);Difficulty in walking, not elsewhere classified (R26.2)     Time: 0034-9179 PT Time Calculation (min) (ACUTE ONLY): 13 min  Charges:  $Gait Training: 8-22 mins                     Jetta Lout PTA 01/18/20, 10:42 AM

## 2020-01-19 DIAGNOSIS — F10121 Alcohol abuse with intoxication delirium: Secondary | ICD-10-CM

## 2020-01-19 DIAGNOSIS — D509 Iron deficiency anemia, unspecified: Secondary | ICD-10-CM

## 2020-01-19 LAB — BASIC METABOLIC PANEL
Anion gap: 12 (ref 5–15)
BUN: 9 mg/dL (ref 8–23)
CO2: 26 mmol/L (ref 22–32)
Calcium: 8.6 mg/dL — ABNORMAL LOW (ref 8.9–10.3)
Chloride: 100 mmol/L (ref 98–111)
Creatinine, Ser: 0.67 mg/dL (ref 0.61–1.24)
GFR calc Af Amer: >60 mL/min (ref >60)
GFR calc non Af Amer: >60 mL/min (ref >60)
Glucose, Bld: 97 mg/dL (ref 70–99)
Potassium: 4 mmol/L (ref 3.5–5.1)
Sodium: 138 mmol/L (ref 135–145)

## 2020-01-19 LAB — CBC
HCT: 32.1 % — ABNORMAL LOW (ref 39.0–52.0)
Hemoglobin: 10.9 g/dL — ABNORMAL LOW (ref 13.0–17.0)
MCH: 30.5 pg (ref 26.0–34.0)
MCHC: 34 g/dL (ref 30.0–36.0)
MCV: 89.9 fL (ref 80.0–100.0)
Platelets: 289 10*3/uL (ref 150–400)
RBC: 3.57 MIL/uL — ABNORMAL LOW (ref 4.22–5.81)
RDW: 14.4 % (ref 11.5–15.5)
WBC: 9.2 10*3/uL (ref 4.0–10.5)
nRBC: 0 % (ref 0.0–0.2)

## 2020-01-19 MED ORDER — ACETAMINOPHEN 325 MG PO TABS
650.0000 mg | ORAL_TABLET | Freq: Four times a day (QID) | ORAL | Status: DC | PRN
Start: 2020-01-19 — End: 2021-04-26

## 2020-01-19 MED ORDER — HYDROCODONE-ACETAMINOPHEN 7.5-325 MG PO TABS
1.0000 | ORAL_TABLET | Freq: Four times a day (QID) | ORAL | Status: DC | PRN
  Administered 2020-01-19: 1 via ORAL

## 2020-01-19 NOTE — Progress Notes (Signed)
   Subjective: 5 Days Post-Op Procedure(s) (LRB): INTRAMEDULLARY (IM) NAIL INTERTROCHANTRIC (Left) Patient reports pain as mild.   Patient is well, and has had no acute complaints or problems Denies any CP, SOB, ABD pain. We will continue with physical therapy today.  Making slow progress.  Objective: Vital signs in last 24 hours: Temp:  [98.4 F (36.9 C)-98.7 F (37.1 C)] 98.7 F (37.1 C) (09/30 0720) Pulse Rate:  [79-99] 79 (09/30 0720) Resp:  [14-18] 18 (09/30 0720) BP: (106-116)/(68-93) 114/74 (09/30 0720) SpO2:  [92 %-98 %] 95 % (09/30 0720)  Intake/Output from previous day: 09/29 0701 - 09/30 0700 In: 480 [P.O.:480] Out: 1050 [Urine:1050] Intake/Output this shift: Total I/O In: -  Out: 500 [Urine:500]  Recent Labs    01/19/20 0540  HGB 10.9*   Recent Labs    01/19/20 0540  WBC 9.2  RBC 3.57*  HCT 32.1*  PLT 289   Recent Labs    01/19/20 0540  NA 138  K 4.0  CL 100  CO2 26  BUN 9  CREATININE 0.67  GLUCOSE 97  CALCIUM 8.6*   No results for input(s): LABPT, INR in the last 72 hours.  EXAM General - Patient is Alert, Appropriate and Oriented Extremity - Neurovascular intact Sensation intact distally Intact pulses distally Dorsiflexion/Plantar flexion intact No cellulitis present Compartment soft Dressing - dressing C/D/I and scant drainage Motor Function - intact, moving foot and toes well on exam.   Past Medical History:  Diagnosis Date  . Anxiety    takes lexapro  . Chronic kidney disease    patient states he was told he has 'moderate kidney disease'  . Degenerative disc disease, lumbar   . Exposure to hepatitis C   . GERD (gastroesophageal reflux disease)    takes OTC as needed - omeprazole  . Headache    on occasion  . History of gallstones   . Hypertension    diet controlled  . Pneumonia approx 15 years ago    Assessment/Plan:   5 Days Post-Op Procedure(s) (LRB): INTRAMEDULLARY (IM) NAIL INTERTROCHANTRIC (Left) Active  Problems:   Closed hip fracture requiring operative repair with routine healing   Anemia  Estimated body mass index is 23.11 kg/m as calculated from the following:   Height as of this encounter: 5\' 9"  (1.753 m).   Weight as of this encounter: 71 kg. Advance diet Up with therapy  vital signs are stable Labs stable Pain well controlled Care management to assist with discharge to skilled nursing facility today   Follow-up with [facility] in 2 weeks TED hose bilateral lower extremity x6 weeks Lovenox 40 mg subcu daily x14 days  DVT Prophylaxis - Lovenox, Foot Pumps and TED hose Weight-Bearing as tolerated to left leg   [facility], PA-C [facility] 01/19/2020, 9:20 AM

## 2020-01-19 NOTE — Discharge Summary (Signed)
[facility] [place] [facility]   PATIENT NAME: Chad [name]    MR#:  [id number]  DATE OF BIRTH:  [birth year]  DATE OF ADMISSION:  01/14/2020 ADMITTING PHYSICIAN: Hannah BeatJan A Mansy, MD  DATE OF DISCHARGE: 01/19/2020  PRIMARY CARE PHYSICIAN: Patient, No Pcp Per    ADMISSION DIAGNOSIS:  Hip fracture (HCC) [S72.009A] Closed left hip fracture (HCC) [S72.002A] Closed fracture of left hip, initial encounter (HCC) [S72.002A] Fall, initial encounter [W19.XXXA] Alcohol withdrawal syndrome with complication (HCC) [F10.239]  DISCHARGE DIAGNOSIS:  Active Problems:   Closed hip fracture requiring operative repair with routine healing   Anemia   SECONDARY DIAGNOSIS:   Past Medical History:  Diagnosis Date  . Anxiety    takes lexapro  . Chronic kidney disease    patient states he was told he has 'moderate kidney disease'  . Degenerative disc disease, lumbar   . Exposure to hepatitis C   . GERD (gastroesophageal reflux disease)    takes OTC as needed - omeprazole  . Headache    on occasion  . History of gallstones   . Hypertension    diet controlled  . Pneumonia approx 15 years ago    HOSPITAL COURSE:   1.  Closed left hip fracture requiring operative repair.  Operation was done on 01/14/2020.  Patient will be discharged to rehab today.  Orthopedic surgery wrote pain medication and Lovenox prescriptions.  Follow-up with orthopedic surgery as outpatient.  Try to convert to Tylenol as quickly as possible.  Please make sure that the patient does not receive more than 4 g of Tylenol in a day since he is on pain medication with Tylenol. 2.  Iron deficiency anemia.  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 3.  Alcohol abuse on thiamine and folic acid 4.  Acute delirium during the hospital course has resolved. 5.  Essential hypertension on Norvasc 6.  Anxiety and depression on Lexapro 7.  Recent laparoscopic cholecystectomy  DISCHARGE  CONDITIONS:   Satisfactory  CONSULTS OBTAINED:  Treatment Team:  Kennedy BuckerMenz, [place], MD  DRUG ALLERGIES:   Allergies  Allergen Reactions  . Morphine And Related Other (See Comments)    Causes "bad disposition"  . Ibuprofen Other (See Comments)    Gi upset    DISCHARGE MEDICATIONS:   Allergies as of 01/19/2020      Reactions   Morphine And Related Other (See Comments)   Causes "bad disposition"   Ibuprofen Other (See Comments)   Gi upset      Medication List    STOP taking these medications   ondansetron 4 MG tablet Commonly known as: Zofran     TAKE these medications   acetaminophen 325 MG tablet Commonly known as: TYLENOL Take 2 tablets (650 mg total) by mouth every 6 (six) hours as needed for mild pain (pain score 1-3 or temp > 100.5). What changed:  medication strength how much to take reasons to take this   amLODipine 5 MG tablet Commonly known as: NORVASC Take 1 tablet (5 mg total) by mouth every evening.   enoxaparin 40 MG/0.4ML injection Commonly known as: LOVENOX Inject 0.4 mLs (40 mg total) into the skin daily for 14 days.   escitalopram 20 MG tablet Commonly known as: LEXAPRO Take 20 mg by mouth daily.   feeding supplement (ENSURE ENLIVE) Liqd Take 237 mLs by mouth 2 (two) times daily between meals.   folic acid 1 MG tablet Commonly known as: FOLVITE Take 1 tablet (1  mg total) by mouth daily.   HYDROcodone-acetaminophen 7.5-325 MG tablet Commonly known as: NORCO Take 1-2 tablets by mouth every 4 (four) hours as needed for severe pain (pain score 7-10).   hydrOXYzine 25 MG capsule Commonly known as: Vistaril Take 1 capsule (25 mg total) by mouth 3 (three) times daily as needed for anxiety or itching.   nicotine 21 mg/24hr patch Commonly known as: NICODERM CQ - dosed in mg/24 hours One 21mg  patch chest wall daily (okay to substitute generic)   pantoprazole 40 MG tablet Commonly known as: Protonix Take 1 tablet (40 mg total) by mouth 2  (two) times daily.   thiamine 100 MG tablet Take 1 tablet (100 mg total) by mouth daily.   traZODone 100 MG tablet Commonly known as: DESYREL Take 1 tablet (100 mg total) by mouth at bedtime.        DISCHARGE INSTRUCTIONS:   Follow-up team at rehab 1 day Follow-up orthopedic surgery  If you experience worsening of your admission symptoms, develop shortness of breath, life threatening emergency, suicidal or homicidal thoughts you must seek medical attention immediately by calling 911 or calling your MD immediately  if symptoms less severe.  You Must read complete instructions/literature along with all the possible adverse reactions/side effects for all the Medicines you take and that have been prescribed to you. Take any new Medicines after you have completely understood and accept all the possible adverse reactions/side effects.   Please note  You were cared for by a hospitalist during your hospital stay. If you have any questions about your discharge medications or the care you received while you were in the hospital after you are discharged, you can call the unit and asked to speak with the hospitalist on call if the hospitalist that took care of you is not available. Once you are discharged, your primary care physician will handle any further medical issues. Please note that NO REFILLS for any discharge medications will be authorized once you are discharged, as it is imperative that you return to your primary care physician (or establish a relationship with a primary care physician if you do not have one) for your aftercare needs so that they can reassess your need for medications and monitor your lab values.    Today   CHIEF COMPLAINT:   Chief Complaint  Patient presents with  . Fall    HISTORY OF PRESENT ILLNESS:  Chad [name]  is a 69 y.o. male came in after a fall and found to have a left hip fracture   VITAL SIGNS:  Blood pressure 114/74, pulse 79, temperature 98.7  F (37.1 C), temperature source Oral, resp. rate 18, height 5\' 9"  (1.753 m), weight 71 kg, SpO2 95 %.  I/O:    Intake/Output Summary (Last 24 hours) at 01/19/2020 1120 Last data filed at 01/19/2020 0900 Gross per 24 hour  Intake 240 ml  Output 1300 ml  Net -1060 ml    PHYSICAL EXAMINATION:  GENERAL:  69 y.o.-year-old patient lying in the bed with no acute distress.  EYES: Pupils equal, round, reactive to light and accommodation. No scleral icterus. HEENT: Head atraumatic, normocephalic. Oropharynx and nasopharynx clear. .  LUNGS: Normal breath sounds bilaterally, no wheezing, rales,rhonchi or crepitation. No use of accessory muscles of respiration.  CARDIOVASCULAR: S1, S2 normal. No murmurs, rubs, or gallops.  ABDOMEN: Soft, non-tender, non-distended. Bowel sounds present. No organomegaly or mass.  EXTREMITIES: No pedal edema.  NEUROLOGIC: Cranial nerves II through XII are intact. Sensation intact.  Gait not checked.  PSYCHIATRIC: The patient is alert and oriented x 3.  SKIN: No obvious rash, lesion, or ulcer.   DATA REVIEW:   CBC Recent Labs  Lab 01/19/20 0540  WBC 9.2  HGB 10.9*  HCT 32.1*  PLT 289    Chemistries  Recent Labs  Lab 01/19/20 0540  NA 138  K 4.0  CL 100  CO2 26  GLUCOSE 97  BUN 9  CREATININE 0.67  CALCIUM 8.6*    Microbiology Results  Results for orders placed or performed during the hospital encounter of 01/14/20  Respiratory Panel by RT PCR (Flu A&B, Covid) - Nasopharyngeal Swab     Status: None   Collection Time: 01/14/20  2:52 AM   Specimen: Nasopharyngeal Swab  Result Value Ref Range Status   SARS Coronavirus 2 by RT PCR NEGATIVE NEGATIVE Final    Comment: (NOTE) SARS-CoV-2 target nucleic acids are NOT DETECTED.  The SARS-CoV-2 RNA is generally detectable in upper respiratoy specimens during the acute phase of infection. The lowest concentration of SARS-CoV-2 viral copies this assay can detect is 131 copies/mL. A negative result does  not preclude SARS-Cov-2 infection and should not be used as the sole basis for treatment or other patient management decisions. A negative result may occur with  improper specimen collection/handling, submission of specimen other than nasopharyngeal swab, presence of viral mutation(s) within the areas targeted by this assay, and inadequate number of viral copies (<131 copies/mL). A negative result must be combined with clinical observations, patient history, and epidemiological information. The expected result is Negative.  Fact Sheet for Patients:  https://www.moore.com/  Fact Sheet for Healthcare Providers:  https://www.young.biz/  This test is no t yet approved or cleared by the [place] FDA and  has been authorized for detection and/or diagnosis of SARS-CoV-2 by FDA under an Emergency Use Authorization (EUA). This EUA will remain  in effect (meaning this test can be used) for the duration of the COVID-19 declaration under Section 564(b)(1) of the Act, 21 U.S.C. section 360bbb-3(b)(1), unless the authorization is terminated or revoked sooner.     Influenza A by PCR NEGATIVE NEGATIVE Final   Influenza B by PCR NEGATIVE NEGATIVE Final    Comment: (NOTE) The Xpert Xpress SARS-CoV-2/FLU/RSV assay is intended as an aid in  the diagnosis of influenza from Nasopharyngeal swab specimens and  should not be used as a sole basis for treatment. Nasal washings and  aspirates are unacceptable for Xpert Xpress SARS-CoV-2/FLU/RSV  testing.  Fact Sheet for Patients: https://www.moore.com/  Fact Sheet for Healthcare Providers: https://www.young.biz/  This test is not yet approved or cleared by the [place] FDA and  has been authorized for detection and/or diagnosis of SARS-CoV-2 by  FDA under an Emergency Use Authorization (EUA). This EUA will remain  in effect (meaning this test can be used) for the  duration of the  Covid-19 declaration under Section 564(b)(1) of the Act, 21  U.S.C. section 360bbb-3(b)(1), unless the authorization is  terminated or revoked. Performed at [facility], [address], [place], [place] [address]      Management plans discussed with the patient, and he is in agreement.  CODE STATUS:     Code Status Orders  (From admission, onward)         Start     Ordered   01/14/20 0245  Full code  Continuous        01/14/20 0247        Code Status History  Date Active Date Inactive Code Status Order ID Comments User Context   01/14/2020 0144 01/14/2020 0247 Full Code [id number]  Nita Sickle, MD ED   12/31/2019 1631 01/06/2020 2346 Full Code [id number]  Gertha Calkin, MD ED   12/09/2019 1729 12/12/2019 1712 Full Code [id number]  Alford Highland, MD ED   02/20/2019 0944 02/20/2019 1734 Full Code [id number]  Chesley Noon, MD ED   01/23/2019 1648 01/25/2019 1957 Full Code [id number]  Mayo, Allyn Kenner, MD Inpatient   05/17/2018 2327 05/18/2018 1902 Full Code [id number]  Oralia Manis, MD Inpatient   05/17/2018 1950 05/17/2018 2327 Full Code [id number]  Nita Sickle, MD ED   12/24/2017 0803 12/25/2017 1645 Full Code [id number]  Arnaldo Natal, MD ED   06/07/2015 1619 06/08/2015 1754 Full Code [id number]  Shirlean Kelly, MD Inpatient   Advance Care Planning Activity      TOTAL TIME TAKING CARE OF THIS PATIENT: 34 minutes.    Alford Highland M.D on 01/19/2020 at 11:20 AM  Between 7am to 6pm - Pager - [phone]  After 6pm go to [email or web address] - password [email or web address]  [facility]  CC: Primary care physician; Patient, No Pcp Per

## 2020-01-19 NOTE — Progress Notes (Signed)
Patient ID: Chad [name], male   DOB: 10-26-1950, 69 y.o.   MRN: [id number] [facility] PROGRESS NOTE  Chad [name] EUM:[id number] DOB: 1950-06-09 DOA: 01/14/2020 PCP: Patient, No Pcp Per  HPI/Subjective: Patient is upset that he could not go out and smoke a cigarette.  It is against hospital policy to go outside and smoke.  He wanted to go home before he goes to the rehab which is also not allowable.  He refused to go to the rehab this afternoon.  Objective: Vitals:   01/19/20 0720 01/19/20 1552  BP: 114/74 121/75  Pulse: 79 78  Resp: 18 18  Temp: 98.7 F (37.1 C) 98.1 F (36.7 C)  SpO2: 95% 97%    Intake/Output Summary (Last 24 hours) at 01/19/2020 1653 Last data filed at 01/19/2020 1300 Gross per 24 hour  Intake 240 ml  Output 1300 ml  Net -1060 ml   Filed Weights   01/13/20 2104  Weight: 71 kg    ROS: Review of Systems  Respiratory: Negative for cough and shortness of breath.   Cardiovascular: Negative for chest pain.  Gastrointestinal: Negative for abdominal pain, nausea and vomiting.  Musculoskeletal: Positive for joint pain.   Exam: Physical Exam HENT:     Head: Normocephalic.     Mouth/Throat:     Pharynx: No oropharyngeal exudate.  Eyes:     General: Lids are normal.     Conjunctiva/sclera: Conjunctivae normal.  Cardiovascular:     Rate and Rhythm: Normal rate and regular rhythm.     Heart sounds: Normal heart sounds, S1 normal and S2 normal.  Pulmonary:     Breath sounds: No decreased breath sounds, wheezing, rhonchi or rales.  Abdominal:     Palpations: Abdomen is soft.     Tenderness: There is no abdominal tenderness.  Musculoskeletal:     Right lower leg: No swelling.     Left lower leg: No swelling.  Skin:    General: Skin is warm.     Findings: No rash.  Neurological:     Mental Status: He is alert and oriented to person, place, and time.       Data Reviewed: Basic Metabolic Panel: Recent Labs  Lab 01/13/20 2118  01/14/20 0415 01/15/20 0425 01/19/20 0540  NA 141 141 135 138  K 4.0 4.1 3.8 4.0  CL 104 103 101 100  CO2 24 28 27 26   GLUCOSE 107* 86 105* 97  BUN 9 11 8 9   CREATININE 0.74 0.73 0.56* 0.67  CALCIUM 9.0 8.6* 8.2* 8.6*   CBC: Recent Labs  Lab 01/13/20 2118 01/14/20 0415 01/15/20 0425 01/19/20 0540  WBC 7.1 9.0 10.2 9.2  NEUTROABS 4.1  --   --   --   HGB 13.8 11.8* 10.8* 10.9*  HCT 39.5 34.8* 32.6* 32.1*  MCV 90.0 89.7 92.6 89.9  PLT 340 299 244 289     Recent Results (from the past 240 hour(s))  Respiratory Panel by RT PCR (Flu A&B, Covid) - Nasopharyngeal Swab     Status: None   Collection Time: 01/14/20  2:52 AM   Specimen: Nasopharyngeal Swab  Result Value Ref Range Status   SARS Coronavirus 2 by RT PCR NEGATIVE NEGATIVE Final    Comment: (NOTE) SARS-CoV-2 target nucleic acids are NOT DETECTED.  The SARS-CoV-2 RNA is generally detectable in upper respiratoy specimens during the acute phase of infection. The lowest concentration of SARS-CoV-2 viral copies this assay can detect is 131 copies/mL. A negative result does not preclude  SARS-Cov-2 infection and should not be used as the sole basis for treatment or other patient management decisions. A negative result may occur with  improper specimen collection/handling, submission of specimen other than nasopharyngeal swab, presence of viral mutation(s) within the areas targeted by this assay, and inadequate number of viral copies (<131 copies/mL). A negative result must be combined with clinical observations, patient history, and epidemiological information. The expected result is Negative.  Fact Sheet for Patients:  https://www.moore.com/  Fact Sheet for Healthcare Providers:  https://www.young.biz/  This test is no t yet approved or cleared by the [place] FDA and  has been authorized for detection and/or diagnosis of SARS-CoV-2 by FDA under an Emergency Use  Authorization (EUA). This EUA will remain  in effect (meaning this test can be used) for the duration of the COVID-19 declaration under Section 564(b)(1) of the Act, 21 U.S.C. section 360bbb-3(b)(1), unless the authorization is terminated or revoked sooner.     Influenza A by PCR NEGATIVE NEGATIVE Final   Influenza B by PCR NEGATIVE NEGATIVE Final    Comment: (NOTE) The Xpert Xpress SARS-CoV-2/FLU/RSV assay is intended as an aid in  the diagnosis of influenza from Nasopharyngeal swab specimens and  should not be used as a sole basis for treatment. Nasal washings and  aspirates are unacceptable for Xpert Xpress SARS-CoV-2/FLU/RSV  testing.  Fact Sheet for Patients: https://www.moore.com/  Fact Sheet for Healthcare Providers: https://www.young.biz/  This test is not yet approved or cleared by the [place] FDA and  has been authorized for detection and/or diagnosis of SARS-CoV-2 by  FDA under an Emergency Use Authorization (EUA). This EUA will remain  in effect (meaning this test can be used) for the duration of the  Covid-19 declaration under Section 564(b)(1) of the Act, 21  U.S.C. section 360bbb-3(b)(1), unless the authorization is  terminated or revoked. Performed at [facility], [address] [place], [place] [address]       Scheduled Meds: . amLODipine  5 mg Oral QPM  . docusate sodium  100 mg Oral BID  . enoxaparin (LOVENOX) injection  40 mg Subcutaneous Q24H  . escitalopram  20 mg Oral Daily  . feeding supplement (ENSURE ENLIVE)  237 mL Oral BID BM  . folic acid  1 mg Oral Daily  . nicotine  21 mg Transdermal Daily  . pantoprazole  40 mg Oral BID  . thiamine  100 mg Oral Daily   Or  . thiamine  100 mg Intravenous Daily  . traMADol  50 mg Oral Q6H  . traZODone  100 mg Oral QHS   Continuous Infusions: . methocarbamol (ROBAXIN) IV      Assessment/Plan:   1. Closed left hip fracture requiring operative  repair.  Patient refused to go to rehab late this afternoon.  Too late to set up home health.  Will need teaching on how to give Lovenox if he goes home.  Will also need APS referral and home health. Will also need a rolling walker if he goes home.  Canceling discharge this evening.  If he goes home this evening will have to be AGAINST MEDICAL ADVICE.  I set up a proper disposition to the rehab already today. 2. Iron deficiency anemia 3. Alcohol abuse on thiamine and folic acid 4. Acute delirium during the hospital course which has resolved 5. Essential hypertension on Norvasc 6. Anxiety depression on Lexapro 7. Recent laparoscopic cholecystectomy    Code Status:     Code Status Orders  (From  admission, onward)         Start     Ordered   01/14/20 0245  Full code  Continuous        01/14/20 0247        Code Status History    Date Active Date Inactive Code Status Order ID Comments User Context   01/14/2020 0144 01/14/2020 0247 Full Code [id number]  Nita Sickle, MD ED   12/31/2019 1631 01/06/2020 2346 Full Code [id number]  Gertha Calkin, MD ED   12/09/2019 1729 12/12/2019 1712 Full Code [id number]  Alford Highland, MD ED   02/20/2019 0944 02/20/2019 1734 Full Code [id number]  Chesley Noon, MD ED   01/23/2019 1648 01/25/2019 1957 Full Code [id number]  Mayo, Allyn Kenner, MD Inpatient   05/17/2018 2327 05/18/2018 1902 Full Code [id number]  Oralia Manis, MD Inpatient   05/17/2018 1950 05/17/2018 2327 Full Code [id number]  Nita Sickle, MD ED   12/24/2017 0803 12/25/2017 1645 Full Code [id number]  Arnaldo Natal, MD ED   06/07/2015 1619 06/08/2015 1754 Full Code [id number]  Shirlean Kelly, MD Inpatient   Advance Care Planning Activity     Disposition Plan: Status is: Inpatient Dispo: The patient is from: Home              Anticipated d/c is to: Was supposed to be today to rehab but now since he refused rehab go to think of other options              Anticipated d/c date is: Was supposed to  be today but refused rehab.  Now hopefully will discharge on 01/20/2020              Patient currently refused to go to rehab this evening.  Time spent: 50 minutes in coordination of care speaking with nursing staff and transitional care team.  Alford Highland  [facility]

## 2020-01-19 NOTE — Progress Notes (Signed)
Physical Therapy Treatment Patient Details Name: Chad [name] MRN: [id number] DOB: June 30, 1950 Today's Date: 01/19/2020    History of Present Illness Per MD note:Chad [name]  is a 69 y.o. Caucasian male with a known history of hypertension, anxiety and depression, as well as ongoing alcohol abuse, who presented to the emergency room with acute onset left hip pain after having an accidental mechanical fall.  He was going to the bathroom apparently lost balance when he fell on his left side.  Denies any presyncope or syncope headache dizziness or blurred vision, chest pain or dyspnea or palpitations.  No paresthesias or focal muscle weakness.  No nausea or vomiting or abdominal pain.  No dysuria, oliguria or hematuria or flank pain.  His last alcoholic drink was at 2 PM.  He usually drinks 2-3 mixed drinks.  He was noted to be shaking the emergency room.    PT Comments    Pt was supine in bed upon arriving. He agrees to PT session and is cooperative throughout. Pt states, "If I can't go to rehab today then I'm gonna just go home." Therapist discussed safety concerns about DC home. He has 16 stairs to enter home and pt has been unwilling to trial due to pain. He was able to improve gait distances however continues to be high fall risk. Acute PT still recommends DC to SNF to address deficits and improve safe functional mobility prior to returning home. He continues to require assistance to get OOB and stand to RW.  Reports 6/10 pain ion wt bearing. RLE toe out/externally rotated during gait. Pt required several rest with ambulation 120 ft. HR/O2 stable on rm air. At conclusion of session, pt was sitting up in bed with call bell in reach and bed alarm in place. Recommend DC to SNF when bed available. Will return later this afternoon per POC if pt is still in hospital.    Follow Up Recommendations  SNF     Equipment Recommendations  Other (comment) (defer to next level of care) will need RW if not DC  to SNF   Recommendations for Other Services       Precautions / Restrictions Precautions Precautions: Fall Precaution Comments: poor safety awareness, high fall risk Restrictions Weight Bearing Restrictions: Yes LLE Weight Bearing: Weight bearing as tolerated    Mobility  Bed Mobility Overal bed mobility: Needs Assistance Bed Mobility: Supine to Sit     Supine to sit: Min assist;Mod assist;HOB elevated     General bed mobility comments: Incraesed time required + min-mod assist to safely achieve EOB sitting 2/2 to pain.   Transfers Overall transfer level: Needs assistance Equipment used: Rolling walker (2 wheeled) Transfers: Sit to/from Stand Sit to Stand: Min assist;From elevated surface         General transfer comment: Pt was unable to stand from bed at lowest point even with max vcs. he only required min assist to stand from elevated surface height. Pt has poor insight of deficits  Ambulation/Gait Ambulation/Gait assistance: Min guard Gait Distance (Feet): 120 Feet Assistive device: Rolling walker (2 wheeled) Gait Pattern/deviations: Antalgic;Step-to pattern;Trunk flexed (RLE toe out/external rotated) Gait velocity: decreased   General Gait Details: Pt ambulated 120 ft with step to gait pattern + constanjt vcs for improved oposture and RLE placement. Pt required 2 standing rest breaks. HR/O2 stable on rm air.   Stairs Stairs:  (pt has 16 steps to enter home)        Balance Overall balance assessment: Needs assistance Sitting-balance  support: Feet supported;Bilateral upper extremity supported Sitting balance-Leahy Scale: Good Sitting balance - Comments: good balance noted in  sitting   Standing balance support: Bilateral upper extremity supported;During functional activity;Single extremity supported Standing balance-Leahy Scale: Fair Standing balance comment: heavy reliance on RW due to pain. pt is high fall risk due to safety awareness deficits and poor  insight of limitations         Cognition Arousal/Alertness: Awake/alert Behavior During Therapy: WFL for tasks assessed/performed Overall Cognitive Status: Within Functional Limits for tasks assessed      General Comments: Pt was A and O x 4 and cooperative throughout.              Pertinent Vitals/Pain Pain Assessment: 0-10 Pain Score: 6  Pain Location: LLE Pain Descriptors / Indicators: Grimacing;Guarding;Constant;Sore Pain Intervention(s): Limited activity within patient's tolerance;Monitored during session;Premedicated before session;Repositioned           PT Goals (current goals can now be found in the care plan section) Acute Rehab PT Goals Patient Stated Goal: go home Progress towards PT goals: Progressing toward goals    Frequency    BID      PT Plan Current plan remains appropriate    Co-evaluation     PT goals addressed during session: Mobility/safety with mobility;Balance;Proper use of DME;Strengthening/ROM        AM-PAC PT "6 Clicks" Mobility   Outcome Measure  Help needed turning from your back to your side while in a flat bed without using bedrails?: A Little Help needed moving from lying on your back to sitting on the side of a flat bed without using bedrails?: A Lot Help needed moving to and from a bed to a chair (including a wheelchair)?: A Lot Help needed standing up from a chair using your arms (e.g., wheelchair or bedside chair)?: A Little Help needed to walk in hospital room?: A Little Help needed climbing 3-5 steps with a railing? : A Lot 6 Click Score: 15    End of Session Equipment Utilized During Treatment: Gait belt Activity Tolerance: Patient tolerated treatment well;Patient limited by fatigue;Patient limited by pain Patient left: in bed;with call bell/phone within reach;with bed alarm set;with nursing/sitter in room Nurse Communication: Mobility status;Precautions PT Visit Diagnosis: Other abnormalities of gait and mobility  (R26.89);Muscle weakness (generalized) (M62.81);Difficulty in walking, not elsewhere classified (R26.2)     Time: 2330-0762 PT Time Calculation (min) (ACUTE ONLY): 24 min  Charges:  $Gait Training: 8-22 mins $Therapeutic Activity: 8-22 mins                     Jetta Lout PTA 01/19/20, 10:39 AM

## 2020-01-19 NOTE — Progress Notes (Addendum)
Pt AAox4, OOB with standby assist and walker. C/O pain in left leg treated with PRN with decent effect. Pt with moderate appetite. Plan to discharge to rehab this after at [facility]. After transport came Pt agitated that he could not go outside to smoke as this is against hospital policy. Pt stated he was going to take a cab home. MD and case manager made aware. After reviewing safe discharge planning with patient for a 3rd time. Pt agreeable to go to rehab. Report called to [facility] at receiving facility. EMS to transport patient.

## 2020-01-20 ENCOUNTER — Encounter: Payer: Medicare Other | Admitting: Physician Assistant

## 2020-02-04 ENCOUNTER — Other Ambulatory Visit: Payer: Self-pay

## 2020-02-04 ENCOUNTER — Inpatient Hospital Stay
Admission: EM | Admit: 2020-02-04 | Discharge: 2020-02-10 | DRG: 640 | Disposition: A | Discharge status: Home Health | Payer: Medicare HMO | Attending: Internal Medicine | Admitting: Internal Medicine

## 2020-02-04 ENCOUNTER — Emergency Department: Payer: Medicare HMO

## 2020-02-04 DIAGNOSIS — R0789 Other chest pain: Secondary | ICD-10-CM | POA: Diagnosis present

## 2020-02-04 DIAGNOSIS — F459 Somatoform disorder, unspecified: Secondary | ICD-10-CM | POA: Diagnosis present

## 2020-02-04 DIAGNOSIS — Z20822 Contact with and (suspected) exposure to covid-19: Secondary | ICD-10-CM | POA: Diagnosis present

## 2020-02-04 DIAGNOSIS — E872 Acidosis, unspecified: Secondary | ICD-10-CM | POA: Diagnosis present

## 2020-02-04 DIAGNOSIS — I10 Essential (primary) hypertension: Secondary | ICD-10-CM | POA: Diagnosis present

## 2020-02-04 DIAGNOSIS — F10239 Alcohol dependence with withdrawal, unspecified: Secondary | ICD-10-CM | POA: Diagnosis present

## 2020-02-04 DIAGNOSIS — F332 Major depressive disorder, recurrent severe without psychotic features: Secondary | ICD-10-CM | POA: Diagnosis present

## 2020-02-04 DIAGNOSIS — F17210 Nicotine dependence, cigarettes, uncomplicated: Secondary | ICD-10-CM | POA: Diagnosis present

## 2020-02-04 DIAGNOSIS — Z23 Encounter for immunization: Secondary | ICD-10-CM

## 2020-02-04 DIAGNOSIS — Z8701 Personal history of pneumonia (recurrent): Secondary | ICD-10-CM

## 2020-02-04 DIAGNOSIS — Z602 Problems related to living alone: Secondary | ICD-10-CM | POA: Diagnosis present

## 2020-02-04 DIAGNOSIS — R739 Hyperglycemia, unspecified: Secondary | ICD-10-CM | POA: Diagnosis not present

## 2020-02-04 DIAGNOSIS — G928 Other toxic encephalopathy: Secondary | ICD-10-CM | POA: Diagnosis present

## 2020-02-04 DIAGNOSIS — Z9114 Patient's other noncompliance with medication regimen: Secondary | ICD-10-CM | POA: Diagnosis not present

## 2020-02-04 DIAGNOSIS — E860 Dehydration: Secondary | ICD-10-CM | POA: Diagnosis present

## 2020-02-04 DIAGNOSIS — F10229 Alcohol dependence with intoxication, unspecified: Secondary | ICD-10-CM | POA: Diagnosis present

## 2020-02-04 DIAGNOSIS — R1033 Periumbilical pain: Secondary | ICD-10-CM | POA: Diagnosis present

## 2020-02-04 DIAGNOSIS — R079 Chest pain, unspecified: Secondary | ICD-10-CM

## 2020-02-04 DIAGNOSIS — F451 Undifferentiated somatoform disorder: Secondary | ICD-10-CM

## 2020-02-04 DIAGNOSIS — R531 Weakness: Secondary | ICD-10-CM

## 2020-02-04 DIAGNOSIS — F10230 Alcohol dependence with withdrawal, uncomplicated: Secondary | ICD-10-CM | POA: Diagnosis not present

## 2020-02-04 DIAGNOSIS — E876 Hypokalemia: Secondary | ICD-10-CM | POA: Diagnosis present

## 2020-02-04 DIAGNOSIS — E8729 Other acidosis: Secondary | ICD-10-CM

## 2020-02-04 LAB — URINALYSIS, COMPLETE (UACMP) WITH MICROSCOPIC
Bacteria, UA: NONE SEEN
Bilirubin Urine: NEGATIVE
Glucose, UA: NEGATIVE mg/dL
Hgb urine dipstick: NEGATIVE
Ketones, ur: 80 mg/dL — AB
Leukocytes,Ua: NEGATIVE
Nitrite: NEGATIVE
Protein, ur: NEGATIVE mg/dL
Specific Gravity, Urine: 1.026 (ref 1.005–1.030)
Squamous Epithelial / HPF: NONE SEEN (ref 0–5)
pH: 5 (ref 5.0–8.0)

## 2020-02-04 LAB — MAGNESIUM: Magnesium: 2.1 mg/dL (ref 1.7–2.4)

## 2020-02-04 LAB — COMPREHENSIVE METABOLIC PANEL
ALT: 34 U/L (ref 0–44)
AST: 99 U/L — ABNORMAL HIGH (ref 15–41)
Albumin: 4.6 g/dL (ref 3.5–5.0)
Alkaline Phosphatase: 120 U/L (ref 38–126)
Anion gap: 26 — ABNORMAL HIGH (ref 5–15)
BUN: 10 mg/dL (ref 8–23)
CO2: 13 mmol/L — ABNORMAL LOW (ref 22–32)
Calcium: 9.2 mg/dL (ref 8.9–10.3)
Chloride: 92 mmol/L — ABNORMAL LOW (ref 98–111)
Creatinine, Ser: 0.75 mg/dL (ref 0.61–1.24)
GFR, Estimated: >60 mL/min (ref >60)
Glucose, Bld: 72 mg/dL (ref 70–99)
Potassium: 4.6 mmol/L (ref 3.5–5.1)
Sodium: 131 mmol/L — ABNORMAL LOW (ref 135–145)
Total Bilirubin: 1.7 mg/dL — ABNORMAL HIGH (ref 0.3–1.2)
Total Protein: 8 g/dL (ref 6.5–8.1)

## 2020-02-04 LAB — RESPIRATORY PANEL BY RT PCR (FLU A&B, COVID)
Influenza A by PCR: NEGATIVE
Influenza B by PCR: NEGATIVE
SARS Coronavirus 2 by RT PCR: NEGATIVE

## 2020-02-04 LAB — TROPONIN I (HIGH SENSITIVITY)
Troponin I (High Sensitivity): 12 ng/L (ref <18)
Troponin I (High Sensitivity): 17 ng/L (ref <18)

## 2020-02-04 LAB — LIPASE, BLOOD: Lipase: 32 U/L (ref 11–51)

## 2020-02-04 LAB — CBC
HCT: 43.8 % (ref 39.0–52.0)
Hemoglobin: 14.4 g/dL (ref 13.0–17.0)
MCH: 29.8 pg (ref 26.0–34.0)
MCHC: 32.9 g/dL (ref 30.0–36.0)
MCV: 90.7 fL (ref 80.0–100.0)
Platelets: 253 10*3/uL (ref 150–400)
RBC: 4.83 MIL/uL (ref 4.22–5.81)
RDW: 14.8 % (ref 11.5–15.5)
WBC: 8.8 10*3/uL (ref 4.0–10.5)
nRBC: 0.2 % (ref 0.0–0.2)

## 2020-02-04 LAB — BETA-HYDROXYBUTYRIC ACID: Beta-Hydroxybutyric Acid: 5.61 mmol/L — ABNORMAL HIGH (ref 0.05–0.27)

## 2020-02-04 LAB — LACTIC ACID, PLASMA
Lactic Acid, Venous: 3.3 mmol/L (ref 0.5–1.9)
Lactic Acid, Venous: 1.6 mmol/L (ref 0.5–1.9)

## 2020-02-04 LAB — PHOSPHORUS: Phosphorus: 2.7 mg/dL (ref 2.5–4.6)

## 2020-02-04 LAB — ETHANOL: Alcohol, Ethyl (B): 193 mg/dL — ABNORMAL HIGH (ref <10)

## 2020-02-04 MED ORDER — SODIUM CHLORIDE 0.9 % IV BOLUS
1000.0000 mL | Freq: Once | INTRAVENOUS | Status: AC
  Administered 2020-02-04: 1000 mL via INTRAVENOUS

## 2020-02-04 MED ORDER — ACETAMINOPHEN 325 MG PO TABS
650.0000 mg | ORAL_TABLET | Freq: Four times a day (QID) | ORAL | Status: DC | PRN
  Filled 2020-02-04 (×3): qty 2

## 2020-02-04 MED ORDER — PROMETHAZINE HCL 25 MG/ML IJ SOLN
12.5000 mg | Freq: Four times a day (QID) | INTRAMUSCULAR | Status: DC | PRN
  Administered 2020-02-04 – 2020-02-06 (×2): 12.5 mg via INTRAVENOUS
  Filled 2020-02-04 (×2): qty 1

## 2020-02-04 MED ORDER — TRAZODONE HCL 100 MG PO TABS
100.0000 mg | ORAL_TABLET | Freq: Every day | ORAL | Status: DC
  Administered 2020-02-04 – 2020-02-07 (×4): 100 mg via ORAL
  Filled 2020-02-04 (×4): qty 1

## 2020-02-04 MED ORDER — ACETAMINOPHEN 650 MG RE SUPP: 650.0000 mg | Freq: Four times a day (QID) | RECTAL | Status: DC | PRN

## 2020-02-04 MED ORDER — LORAZEPAM 2 MG/ML IJ SOLN
1.0000 mg | Freq: Once | INTRAMUSCULAR | Status: AC
  Administered 2020-02-04: 1 mg via INTRAVENOUS
  Filled 2020-02-04: qty 1

## 2020-02-04 MED ORDER — HYDROCODONE-ACETAMINOPHEN 7.5-325 MG PO TABS
1.0000 | ORAL_TABLET | Freq: Four times a day (QID) | ORAL | Status: DC | PRN
  Administered 2020-02-05 – 2020-02-08 (×7): 1 via ORAL
  Filled 2020-02-04 (×7): qty 1

## 2020-02-04 MED ORDER — LIDOCAINE HCL (PF) 1 % IJ SOLN
5.0000 mL | Freq: Once | INTRAMUSCULAR | Status: AC
  Administered 2020-02-04: 5 mL via INTRADERMAL
  Filled 2020-02-04: qty 5

## 2020-02-04 MED ORDER — CHLORDIAZEPOXIDE HCL 25 MG PO CAPS
25.0000 mg | ORAL_CAPSULE | Freq: Once | ORAL | Status: AC
  Administered 2020-02-04: 25 mg via ORAL
  Filled 2020-02-04: qty 1

## 2020-02-04 MED ORDER — ADULT MULTIVITAMIN W/MINERALS CH: 1.0000 | ORAL_TABLET | Freq: Every day | ORAL | Status: DC

## 2020-02-04 MED ORDER — FOLIC ACID 1 MG PO TABS
1.0000 mg | ORAL_TABLET | Freq: Every day | ORAL | Status: DC
  Administered 2020-02-05 – 2020-02-10 (×6): 1 mg via ORAL
  Filled 2020-02-04 (×6): qty 1

## 2020-02-04 MED ORDER — SODIUM CHLORIDE 0.9 % IV BOLUS
500.0000 mL | Freq: Once | INTRAVENOUS | Status: AC
  Administered 2020-02-04: 500 mL via INTRAVENOUS

## 2020-02-04 MED ORDER — ADULT MULTIVITAMIN W/MINERALS CH
1.0000 | ORAL_TABLET | Freq: Every day | ORAL | Status: DC
  Administered 2020-02-05 – 2020-02-10 (×6): 1 via ORAL
  Filled 2020-02-04 (×6): qty 1

## 2020-02-04 MED ORDER — ENOXAPARIN SODIUM 40 MG/0.4ML ~~LOC~~ SOLN
40.0000 mg | SUBCUTANEOUS | Status: DC
  Administered 2020-02-04 – 2020-02-09 (×6): 40 mg via SUBCUTANEOUS
  Filled 2020-02-04 (×6): qty 0.4

## 2020-02-04 MED ORDER — LORAZEPAM 1 MG PO TABS
1.0000 mg | ORAL_TABLET | ORAL | Status: AC | PRN
  Administered 2020-02-04: 2 mg via ORAL
  Administered 2020-02-07: 1 mg via ORAL
  Filled 2020-02-04: qty 2
  Filled 2020-02-04: qty 1

## 2020-02-04 MED ORDER — DEXTROSE-NACL 5-0.9 % IV SOLN: INTRAVENOUS | Status: DC

## 2020-02-04 MED ORDER — IOHEXOL 350 MG/ML SOLN
75.0000 mL | Freq: Once | INTRAVENOUS | Status: AC | PRN
  Administered 2020-02-04: 75 mL via INTRAVENOUS

## 2020-02-04 MED ORDER — LORAZEPAM 2 MG/ML IJ SOLN
1.0000 mg | INTRAMUSCULAR | Status: AC | PRN
  Administered 2020-02-04 – 2020-02-06 (×4): 1 mg via INTRAVENOUS
  Administered 2020-02-06: 2 mg via INTRAVENOUS
  Filled 2020-02-04 (×6): qty 1

## 2020-02-04 MED ORDER — THIAMINE HCL 100 MG PO TABS
100.0000 mg | ORAL_TABLET | Freq: Every day | ORAL | Status: DC
  Administered 2020-02-05 – 2020-02-10 (×6): 100 mg via ORAL
  Filled 2020-02-04 (×6): qty 1

## 2020-02-04 MED ORDER — CHLORDIAZEPOXIDE HCL 25 MG PO CAPS
25.0000 mg | ORAL_CAPSULE | Freq: Three times a day (TID) | ORAL | Status: DC
  Administered 2020-02-04 – 2020-02-06 (×5): 25 mg via ORAL
  Filled 2020-02-04 (×5): qty 1

## 2020-02-04 MED ORDER — FENTANYL CITRATE (PF) 100 MCG/2ML IJ SOLN
50.0000 ug | INTRAMUSCULAR | Status: DC | PRN
  Administered 2020-02-04: 50 ug via INTRAVENOUS
  Filled 2020-02-04: qty 2

## 2020-02-04 MED ORDER — ONDANSETRON HCL 4 MG/2ML IJ SOLN
4.0000 mg | Freq: Four times a day (QID) | INTRAMUSCULAR | Status: DC | PRN
  Administered 2020-02-04: 4 mg via INTRAVENOUS
  Filled 2020-02-04: qty 2

## 2020-02-04 MED ORDER — ONDANSETRON HCL 4 MG PO TABS: 4.0000 mg | ORAL_TABLET | Freq: Four times a day (QID) | ORAL | Status: DC | PRN

## 2020-02-04 NOTE — ED Notes (Signed)
Date and time results received: 02/04/20  1148 Per Judeth Cornfield in Lab  Test: Lactic Acid  Critical Value: 3.3  Name of Provider Notified: Roxan Hockey MD  Orders Received? Or Actions Taken?: MD to follow up
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
## 2020-02-04 NOTE — ED Provider Notes (Signed)
----------------------------------------- °  5:28 PM on 02/04/2020 -----------------------------------------  CT angio chest is negative for acute PE.  Given the patient's elevated lactate and anion gap, I will proceed with admission.  I discussed the case with the hospitalist.   Dionne Bucy, MD 02/04/20 1733

## 2020-02-04 NOTE — ED Triage Notes (Signed)
Pt arrives via EMS from home after having chest pain x3 days that started while he was sitting down- pt describes it as tightness in the right side- pt has been drinking alcohol for the past 2 days with last drink 20 minutes ago- pt was seen here about 1 week ago for a fall and had a broken hip- pt had surgery on the hip and has been using a walker to get around

## 2020-02-04 NOTE — ED Notes (Signed)
Critical Result time documented before was incorrect. Was not 1148 ( this was collection time). Time of critical result reading was 1350

## 2020-02-04 NOTE — ED Notes (Signed)
Report given to floor nurse Susy Manor RN.

## 2020-02-05 LAB — LACTIC ACID, PLASMA: Lactic Acid, Venous: 1.3 mmol/L (ref 0.5–1.9)

## 2020-02-05 LAB — CBC
HCT: 36.5 % — ABNORMAL LOW (ref 39.0–52.0)
Hemoglobin: 12.4 g/dL — ABNORMAL LOW (ref 13.0–17.0)
MCH: 30.3 pg (ref 26.0–34.0)
MCHC: 34 g/dL (ref 30.0–36.0)
MCV: 89.2 fL (ref 80.0–100.0)
Platelets: UNDETERMINED 10*3/uL (ref 150–400)
RBC: 4.09 MIL/uL — ABNORMAL LOW (ref 4.22–5.81)
RDW: 14.5 % (ref 11.5–15.5)
WBC: 5.7 10*3/uL (ref 4.0–10.5)
nRBC: 0 % (ref 0.0–0.2)

## 2020-02-05 LAB — BASIC METABOLIC PANEL
Anion gap: 8 (ref 5–15)
BUN: 8 mg/dL (ref 8–23)
CO2: 27 mmol/L (ref 22–32)
Calcium: 9 mg/dL (ref 8.9–10.3)
Chloride: 100 mmol/L (ref 98–111)
Creatinine, Ser: 0.72 mg/dL (ref 0.61–1.24)
GFR, Estimated: >60 mL/min (ref >60)
Glucose, Bld: 207 mg/dL — ABNORMAL HIGH (ref 70–99)
Potassium: 4.4 mmol/L (ref 3.5–5.1)
Sodium: 135 mmol/L (ref 135–145)

## 2020-02-05 MED ORDER — INFLUENZA VAC A&B SA ADJ QUAD 0.5 ML IM PRSY
0.5000 mL | PREFILLED_SYRINGE | INTRAMUSCULAR | Status: AC
  Administered 2020-02-06: 0.5 mL via INTRAMUSCULAR
  Filled 2020-02-05: qty 0.5

## 2020-02-05 MED ORDER — LACTATED RINGERS IV SOLN: INTRAVENOUS | Status: DC

## 2020-02-05 NOTE — TOC Initial Note (Signed)
Transition of Care [facility]) - Initial/Assessment Note    Patient Details  Name: Chad [name] MRN: [id number] Date of Birth: 03-19-1951  [facility]) CM/SW Contact:    Luvenia Redden, RN Phone [phone] 02/05/2020, 9:51 AM  Clinical Narrative:                 RN spoke with pt today introduce services and purpose for today encounter. Discussed substance use/abuse and the impact on his health if untreated. Pt was receptive to the information provided however his response was "I don't care" he not interesting in quitting at this time but again receptive to the resources. Discussed facilities nearby and further inquired on his needs.   Initial assessment completed at pt verified sufficient transportation with family and his niece for support system. Pt able to afford his medications but currently states he has no medications that he administers. Lives alone in an upper-stairs apartment. States he food is delivered on dry ice and he uses a rolling walker when ambulating with no falls or related problems.   Pt has had placement in the past to SNF but not sure what agency involved at that time. RN will provide CMS list for agencies if therapy is considered upon pt's discharge. No other needs discussed at this time.  TOC team will continue to follow.   Expected Discharge Plan: Home/Self Care (PENDING) Barriers to Discharge: Continued Medical Work up   Patient Goals and CMS Choice   CMS Medicare.gov Compare Post Acute Care list provided to:: Patient Choice offered to / list presented to : Patient  Expected Discharge Plan and Services Expected Discharge Plan: Home/Self Care (PENDING)       Living arrangements for the past 2 months: Apartment                                      Prior Living Arrangements/Services Living arrangements for the past 2 months: Apartment Lives with:: Self   Do you feel safe going back to the place where you live?: Yes       Need for Family Participation in Patient Care: Yes (Comment) Care giver support system in place?: Yes (comment)   Criminal Activity/Legal Involvement Pertinent to Current Situation/Hospitalization: No - Comment as needed  Activities of Daily Living Home Assistive Devices/Equipment: Walker (specify type) ADL Screening (condition at time of admission) Patient's cognitive ability adequate to safely complete daily activities?: Yes Is the patient deaf or have difficulty hearing?: No Does the patient have difficulty seeing, even when wearing glasses/contacts?: No Does the patient have difficulty concentrating, remembering, or making decisions?: No Patient able to express need for assistance with ADLs?: Yes Does the patient have difficulty dressing or bathing?: No Independently performs ADLs?: No Communication: Independent Dressing (OT): Needs assistance Is this a change from baseline?: Pre-admission baseline Grooming: Needs assistance Is this a change from baseline?: Pre-admission baseline Feeding: Independent Bathing: Needs assistance Is this a change from baseline?: Pre-admission baseline Toileting: Needs assistance Is this a change from baseline?: Pre-admission baseline In/Out Bed: Needs assistance Is this a change from baseline?: Pre-admission baseline Walks in Home: Independent with device (comment) (walker) Does the patient have difficulty walking or climbing stairs?: Yes Weakness of Legs: None Weakness of Arms/Hands: None  Permission Sought/Granted                  Emotional Assessment Appearance:: Appears stated age Attitude/Demeanor/Rapport: Engaged Affect (  typically observed): Accepting, Calm Orientation: : Oriented to Self, Oriented to Place, Oriented to  Time, Oriented to Situation Alcohol / Substance Use: Alcohol Use Psych Involvement: No (comment)  Admission diagnosis:  Lactic acidosis [E87.2] Metabolic acidosis [E87.2] Nonspecific chest pain  [R07.9] Patient Active Problem List   Diagnosis Date Noted  . Lactic acidosis 02/04/2020  . Dehydration 02/04/2020  . Metabolic acidosis 02/04/2020  . Generalized weakness 02/04/2020  . Alcohol abuse with intoxication delirium (HCC)   . Anemia   . Closed hip fracture requiring operative repair with routine healing 01/14/2020  . Calculus of gallbladder with acute cholecystitis without obstruction   . GI bleed 12/31/2019  . GIB (gastrointestinal bleeding) 12/31/2019  . Epigastric pain   . Alcohol abuse   . Chest pain   . Cough   . Tobacco abuse   . Alcohol dependence with uncomplicated withdrawal (HCC) 01/23/2019  . 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 Anxiety and depression 01/23/2019  . HTN (hypertension) 05/17/2018  . Alcohol withdrawal (HCC) 05/17/2018  . DVT (deep venous thrombosis) (HCC) 05/17/2018  . 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 BACK PAIN, LUMBAR 11/10/2006   PCP:  Rayetta Humphrey, MD Pharmacy:   [id number] [place], [place] - [address] [place] [address] Phone: [phone] Fax: [phone]  [id number] [name], [place] - [address] AT [facility] OF [address] [facility] [address] [place] [address] Phone: [phone] Fax: [phone]     Social Determinants of Health (SDOH) Interventions    Readmission Risk Interventions No flowsheet data found.

## 2020-02-05 NOTE — Progress Notes (Signed)
PT Cancellation Note  Patient Details Name: Chad [name] MRN: [id number] DOB: 1951/03/16   Cancelled Treatment:    Reason Eval/Treat Not Completed: Patient declined, no reason specified, Patient reports that he doesn't feel well.   [address] [place], [place] DPT 02/05/2020, 3:05 PM

## 2020-02-05 NOTE — Progress Notes (Addendum)
Progress Note    Chad JarredMichael Friedt  ZOX:[id number] DOB: [birth year]  DOA: 02/04/2020 PCP: [name], [name], MD      Brief Narrative:    Medical records reviewed and are as summarized below:  Chad [name] is a 69 y.o. male with medical history significant for lumbar degenerative disc disease, GERD, hypertension, anxiety, depression, history of pneumonia, alcohol use disorder, tobacco use disorder, history of gallstone cholecystitis requiring lap cholecystectomy, recent left hip fracture requiring surgical intervention on 01/14/2020. He presented to hospital because of generalized weakness, vomiting, poor oral intake, dehydration, right upper quadrant abdominal pain (said he has had the symptoms gallbladder surgery in September 2021) and mid abdominal pain.   He was admitted to the hospital for alcoholic ketoacidosis, dehydration, alcohol withdrawal syndrome and generalized weakness.  He was treated with IV fluids, benzodiazepines and analgesics.  He also complained of pain in the mid abdomen and pain in the lower side of the right chest but this is actually pain from right upper quadrant area which he attributes to pain from recent gallbladder surgery.    Assessment/Plan:   Principal Problem:   Alcoholic ketoacidosis Active Problems:   Alcohol dependence with uncomplicated withdrawal (HCC)   Lactic acidosis   Dehydration   Generalized weakness  Body mass index is 22.19 kg/m.  PLAN  Alcoholic ketoacidosis/lactic acidosis/ poor oral intake/dehydration: Beta hydroxybutyric acid was elevated at 4.61.  Metabolic acidosis has improved but continue IV fluids because of poor oral intake and dehydration.  Change IV fluids from D5 NS to Ringer's lactate infusion because of hyperglycemia.  Alcohol withdrawal syndrome: Continue benzodiazepines per CIWA protocol.  Generalized weakness: PT evaluation.  Hypertension: Hold amlodipine for now.  Recent cholecystectomy and right hip  surgery in September 2021     Diet Order            Diet regular Room service appropriate? Yes; Fluid consistency: Thin  Diet effective now                    Consultants:  None  Procedures:  None    Medications:   . chlordiazePOXIDE  25 mg Oral TID  . enoxaparin (LOVENOX) injection  40 mg Subcutaneous Q24H  . folic acid  1 mg Oral Daily  . [START ON 02/06/2020] influenza vaccine adjuvanted  0.5 mL Intramuscular Tomorrow-1000  . multivitamin with minerals  1 tablet Oral Daily  . thiamine  100 mg Oral Daily  . traZODone  100 mg Oral QHS   Continuous Infusions: . lactated ringers       Anti-infectives (From admission, onward)   None             Family Communication/Anticipated D/C date and plan/Code Status   DVT prophylaxis: enoxaparin (LOVENOX) injection 40 mg Start: 02/04/20 2200 SCDs Start: 02/04/20 1815     Code Status: Full Code  Family Communication: Plan discussed with patient Disposition Plan:    Status is: Inpatient  Remains inpatient appropriate because:Inpatient level of care appropriate due to severity of illness   Dispo: The patient is from: Home              Anticipated d/c is to: Home              Anticipated d/c date is: 1 day              Patient currently is not medically stable to d/c.           Subjective:  He complains of generalized weakness.  He said his appetite is poor and has not eating much since admission.  No chest pain.  Mid abdominal pain is better.  He still has some right upper quadrant pain.  He said he has had pain in this area since his gallbladder surgery in September 2021.  Objective:    Vitals:   02/04/20 2223 02/04/20 2341 02/05/20 0342 02/05/20 0936  BP: (!) 142/86 134/74 124/78 124/77  Pulse: 96 96 99 94  Resp: 20 16 20 18   Temp: 98.1 F (36.7 C) 98.5 F (36.9 C) 97.6 F (36.4 C) 97.7 F (36.5 C)  TempSrc: Oral Oral Oral Oral  SpO2: 100% 96% 96% 96%  Weight: 66.2 kg       Height: 5\' 8"  (1.727 m)      No data found.   Intake/Output Summary (Last 24 hours) at 02/05/2020 1145 Last data filed at 02/05/2020 02/07/2020 Gross per 24 hour  Intake 737.35 ml  Output 450 ml  Net 287.35 ml   Filed Weights   02/04/20 0901 02/04/20 2223  Weight: 71 kg 66.2 kg    Exam:  GEN: NAD SKIN: No rash EYES: EOMI ENT: MMM CV: RRR PULM: CTA B ABD: soft, ND, right upper quadrant tenderness just under the rib cage (he said he has had this pain/tenderness since his gallbladder surgery), +BS CNS: AAO x 3, non focal EXT: No edema or tenderness   Data Reviewed:   I have personally reviewed following labs and imaging studies:  Labs: Labs show the following:   Basic Metabolic Panel: Recent Labs  Lab 02/04/20 1016 02/04/20 1147 02/05/20 0513  NA 131*  --  135  K 4.6  --  4.4  CL 92*  --  100  CO2 13*  --  27  GLUCOSE 72  --  207*  BUN 10  --  8  CREATININE 0.75  --  0.72  CALCIUM 9.2  --  9.0  MG  --  2.1  --   PHOS  --  2.7  --    GFR Estimated Creatinine Clearance: 83.9 mL/min (by C-G formula based on SCr of 0.72 mg/dL). Liver Function Tests: Recent Labs  Lab 02/04/20 1016  AST 99*  ALT 34  ALKPHOS 120  BILITOT 1.7*  PROT 8.0  ALBUMIN 4.6   Recent Labs  Lab 02/04/20 1016  LIPASE 32   No results for input(s): AMMONIA in the last 168 hours. Coagulation profile No results for input(s): INR, PROTIME in the last 168 hours.  CBC: Recent Labs  Lab 02/04/20 1015 02/05/20 0513  WBC 8.8 5.7  HGB 14.4 12.4*  HCT 43.8 36.5*  MCV 90.7 89.2  PLT 253 PLATELET CLUMPS NOTED ON SMEAR, UNABLE TO ESTIMATE   Cardiac Enzymes: No results for input(s): CKTOTAL, CKMB, CKMBINDEX, TROPONINI in the last 168 hours. BNP (last 3 results) No results for input(s): PROBNP in the last 8760 hours. CBG: No results for input(s): GLUCAP in the last 168 hours. D-Dimer: No results for input(s): DDIMER in the last 72 hours. Hgb A1c: No results for input(s): HGBA1C in  the last 72 hours. Lipid Profile: No results for input(s): CHOL, HDL, LDLCALC, TRIG, CHOLHDL, LDLDIRECT in the last 72 hours. Thyroid function studies: No results for input(s): TSH, T4TOTAL, T3FREE, THYROIDAB in the last 72 hours.  Invalid input(s): FREET3 Anemia work up: No results for input(s): VITAMINB12, FOLATE, FERRITIN, TIBC, IRON, RETICCTPCT in the last 72 hours. Sepsis Labs: Recent Labs  Lab 02/04/20  1015 02/04/20 1148 02/04/20 1914 02/05/20 0513  WBC 8.8  --   --  5.7  LATICACIDVEN  --  3.3* 1.6 1.3    Microbiology Recent Results (from the past 240 hour(s))  Respiratory Panel by RT PCR (Flu A&B, Covid) - Nasopharyngeal Swab     Status: None   Collection Time: 02/04/20 10:08 PM   Specimen: Nasopharyngeal Swab  Result Value Ref Range Status   SARS Coronavirus 2 by RT PCR NEGATIVE NEGATIVE Final    Comment: (NOTE) SARS-CoV-2 target nucleic acids are NOT DETECTED.  The SARS-CoV-2 RNA is generally detectable in upper respiratoy specimens during the acute phase of infection. The lowest concentration of SARS-CoV-2 viral copies this assay can detect is 131 copies/mL. A negative result does not preclude SARS-Cov-2 infection and should not be used as the sole basis for treatment or other patient management decisions. A negative result may occur with  improper specimen collection/handling, submission of specimen other than nasopharyngeal swab, presence of viral mutation(s) within the areas targeted by this assay, and inadequate number of viral copies (<131 copies/mL). A negative result must be combined with clinical observations, patient history, and epidemiological information. The expected result is Negative.  Fact Sheet for Patients:  https://www.moore.com/  Fact Sheet for Healthcare Providers:  https://www.young.biz/  This test is no t yet approved or cleared by the [place] FDA and  has been authorized for detection  and/or diagnosis of SARS-CoV-2 by FDA under an Emergency Use Authorization (EUA). This EUA will remain  in effect (meaning this test can be used) for the duration of the COVID-19 declaration under Section 564(b)(1) of the Act, 21 U.S.C. section 360bbb-3(b)(1), unless the authorization is terminated or revoked sooner.     Influenza A by PCR NEGATIVE NEGATIVE Final   Influenza B by PCR NEGATIVE NEGATIVE Final    Comment: (NOTE) The Xpert Xpress SARS-CoV-2/FLU/RSV assay is intended as an aid in  the diagnosis of influenza from Nasopharyngeal swab specimens and  should not be used as a sole basis for treatment. Nasal washings and  aspirates are unacceptable for Xpert Xpress SARS-CoV-2/FLU/RSV  testing.  Fact Sheet for Patients: https://www.moore.com/  Fact Sheet for Healthcare Providers: https://www.young.biz/  This test is not yet approved or cleared by the [place] FDA and  has been authorized for detection and/or diagnosis of SARS-CoV-2 by  FDA under an Emergency Use Authorization (EUA). This EUA will remain  in effect (meaning this test can be used) for the duration of the  Covid-19 declaration under Section 564(b)(1) of the Act, 21  U.S.C. section 360bbb-3(b)(1), unless the authorization is  terminated or revoked. Performed at [facility], [address], [place], [place] [address]     Procedures and diagnostic studies:  CT Angio Chest PE W and/or Wo Contrast  Result Date: 02/04/2020 CLINICAL DATA:  PE suspected.  Chest pain. EXAM: CT ANGIOGRAPHY CHEST WITH CONTRAST TECHNIQUE: Multidetector CT imaging of the chest was performed using the standard protocol during bolus administration of intravenous contrast. Multiplanar CT image reconstructions and MIPs were obtained to evaluate the vascular anatomy. CONTRAST:  14mL OMNIPAQUE IOHEXOL 350 MG/ML SOLN COMPARISON:  CT dated 12/09/2019. FINDINGS: Cardiovascular: Evaluation is  limited by respiratory motion artifact. Given this limitation, no acute pulmonary embolism was detected. The size of the main pulmonary artery is normal. Heart size is normal, with no pericardial effusion. The course and caliber of the aorta are normal. There is mild atherosclerotic calcification. Opacification decreased due to pulmonary arterial phase contrast  bolus timing. Mediastinum/Nodes: -- No mediastinal lymphadenopathy. -- No hilar lymphadenopathy. -- No axillary lymphadenopathy. -- No supraclavicular lymphadenopathy. -- Normal thyroid gland where visualized. -  Unremarkable esophagus. Lungs/Pleura: Airways are patent. No pleural effusion, lobar consolidation, pneumothorax or pulmonary infarction. Upper Abdomen: Contrast bolus timing is not optimized for evaluation of the abdominal organs. The patient is status post prior cholecystectomy. There is severe hepatic steatosis. Musculoskeletal: No chest wall abnormality. No bony spinal canal stenosis. Review of the MIP images confirms the above findings. IMPRESSION: 1. Evaluation is limited by respiratory motion artifact. Given this limitation, no acute pulmonary embolism was detected. 2. Severe hepatic steatosis. Aortic Atherosclerosis (ICD10-I70.0). Electronically Signed   By: Katherine Mantle M.D.   On: 02/04/2020 16:05   DG Chest Portable 1 View  Result Date: 02/04/2020 CLINICAL DATA:  Chest pain EXAM: PORTABLE CHEST 1 VIEW COMPARISON:  January 14, 2020. FINDINGS: There is no edema or airspace opacity. Heart is upper normal in size with pulmonary vascularity normal. No adenopathy. No bone lesions. No pneumothorax. IMPRESSION: No edema or airspace opacity.  Heart upper normal in size. Electronically Signed   By: Bretta Bang III M.D.   On: 02/04/2020 09:41               LOS: 1 day   Jaloni Davoli  [facility]   Pager on [email or web address]. If 7PM-7AM, please contact night-coverage at [email or web address]     02/05/2020, 11:45 AM

## 2020-02-06 MED ORDER — CHLORDIAZEPOXIDE HCL 25 MG PO CAPS
25.0000 mg | ORAL_CAPSULE | Freq: Two times a day (BID) | ORAL | Status: DC
  Administered 2020-02-06 – 2020-02-10 (×8): 25 mg via ORAL
  Filled 2020-02-06 (×8): qty 1

## 2020-02-06 NOTE — TOC Transition Note (Addendum)
Transition of Care [facility]) - CM/SW Discharge Note   Patient Details  Name: Chad [name] MRN: [id number] Date of Birth: June 01, 1950  [facility] CM/SW Contact:  Margarito Liner, LCSW Phone Number: 02/06/2020, 1:00 PM   Clinical Narrative:   Patient has orders to discharge home today. No further concerns. CSW signing off.  2:41 pm: MD cancelled discharge because PT recommended SNF. Patient went to [facility] 9/17 and 9/30 so need to see how many SNF days he used. Left message for admissions coordinator. Patient does not have a secondary insurance to cover daily copay once he reaches his copay days. Asked PT to work with him again in the morning. [employer] will want to see patient putting forth effort with therapy sessions. Asked MD to enter an OT order.  Final next level of care: Home/Self Care Barriers to Discharge: Continued Medical Work up   Patient Goals and CMS Choice   CMS Medicare.gov Compare Post Acute Care list provided to:: Patient Choice offered to / list presented to : Patient  Discharge Placement                    Patient and family notified of of transfer: 02/06/20  Discharge Plan and Services                                     Social Determinants of Health (SDOH) Interventions     Readmission Risk Interventions No flowsheet data found.

## 2020-02-06 NOTE — Progress Notes (Signed)
Physical Therapy Treatment Patient Details Name: Chad [name] MRN: [id number] DOB: 05-01-1950 Today's Date: 02/06/2020    History of Present Illness Chad [name] is a 69 y.o. male with medical history significant for lumbar degenerative disc disease, GERD, hypertension, anxiety, depression, history of pneumonia, alcohol use disorder, tobacco use disorder,history of gallstone cholecystitis requiring lap cholecystectomy, recent left hip fracture requiringsurgical intervention on 01/14/2020. He presented to hospital because of generalized weakness, vomiting, poor oral intake, dehydration, right upper quadrant abdominal pain (said he has had the symptoms gallbladder surgery in September 2021) and mid abdominal pain. He is admitted for alcoholic ketoacidosis, dehydration, alcohol withdrawal syndrome and generalized weakness.    PT Comments    On arrival to room, pt is trying to get out of bed. He notes needing to pee immediately and if he holds it for any longer, it will make a mess. Pt is instructed to press the call bell and wait for assistance if he wants to get out of bed. During supine to sit, patient requires modA+1 with UE support needed to help bring him sitting EOB. He is maxA+1 to maintain seated balance with posterior lean. For returning to bed, pt is modA+1 as he needed assistance with LEs and help pushing up higher in bed. VCs also utilized to help reposition him higher in bed. Once laying in bed, pt is agreeable to answer questions but declines further physical activities due to pain and fatigue. Pt currently presents with deficits in strength, mobility, functional activity tolerance, and balance. Pt would benefit from skilled PT during acute stay to address aforementioned deficits and STR at discharge to optimize return to PLOF and maximize functional mobility.    Follow Up Recommendations  SNF     Equipment Recommendations  None recommended by PT    Recommendations for Other  Services       Precautions / Restrictions Precautions Precautions: Fall Precaution Comments: poor safety awareness, high fall risk Restrictions Weight Bearing Restrictions: Yes LLE Weight Bearing: Weight bearing as tolerated Other Position/Activity Restrictions: L LE WBAT    Mobility  Bed Mobility Overal bed mobility: Needs Assistance Bed Mobility: Supine to Sit     Supine to sit: Mod assist;HOB elevated Sit to supine: Mod assist (Pt needed help with LEs and help pushing up higher in bed)   General bed mobility comments: Pt needed outside assistance UE support for sit<>supine.   Transfers                 General transfer comment: Did not perform due to patient's refusal  Ambulation/Gait             General Gait Details: Did not perform due to patient's refusal   Stairs             Wheelchair Mobility    Modified Rankin (Stroke Patients Only)       Balance Overall balance assessment: Needs assistance Sitting-balance support: Bilateral upper extremity supported;No upper extremity supported Sitting balance-Leahy Scale: Poor Sitting balance - Comments: Pt required maxA+1 for sitting EOB with feet supported only, as his hands were occupied to use urinal. Postural control: Posterior lean     Standing balance comment: Did not assess due to patient refusal                            Cognition Arousal/Alertness: Awake/alert Behavior During Therapy: Impulsive;Flat affect Overall Cognitive Status: Impaired/Different from baseline Area of Impairment: Safety/judgement;Awareness;Orientation  Orientation Level: Disoriented to;Time       Safety/Judgement: Decreased awareness of safety     General Comments: Pt is A&Ox3 as he is unable to explain what month/year it is. He is A&Ox4 at baseline. He is impulsive as he starts moving before it is safe      Exercises Other Exercises Other Exercises: With VCs patient able  to position himself higher in bed with CGA    General Comments        Pertinent Vitals/Pain Pain Assessment: 0-10 Pain Score: 8  Pain Location: LLE Pain Descriptors / Indicators: Aching;Sore Pain Intervention(s): Monitored during session    Home Living Family/patient expects to be discharged to:: Private residence Living Arrangements: Alone Available Help at Discharge: Friend(s);Available PRN/intermittently Type of Home: Apartment Home Access: Stairs to enter Entrance Stairs-Rails: Can reach both Home Layout: One level Home Equipment: Walker - 2 wheels;Cane - single point;Shower seat - built in      Prior Function Level of Independence: Independent with assistive device(s)      Comments: Pt is modified independent and uses RW for mobility. He is independent with most I/ADLs (bathing, dressing, cooking, cleaning). Pt reports he doesn't drive or work anymore. Pt reports 1 fall in last 6 months.   PT Goals (current goals can now be found in the care plan section) Acute Rehab PT Goals Patient Stated Goal: "to go home" PT Goal Formulation: With patient Time For Goal Achievement: 02/20/20 Potential to Achieve Goals: Fair    Frequency    Min 2X/week      PT Plan      Co-evaluation              AM-PAC PT "6 Clicks" Mobility   Outcome Measure  Help needed turning from your back to your side while in a flat bed without using bedrails?: A Little Help needed moving from lying on your back to sitting on the side of a flat bed without using bedrails?: A Little Help needed moving to and from a bed to a chair (including a wheelchair)?: A Lot Help needed standing up from a chair using your arms (e.g., wheelchair or bedside chair)?: A Lot Help needed to walk in hospital room?: A Lot Help needed climbing 3-5 steps with a railing? : A Lot 6 Click Score: 14    End of Session Equipment Utilized During Treatment: Other (comment) (None) Activity Tolerance: Patient limited by  lethargy;Patient limited by pain Patient left: in bed;with call bell/phone within reach;with bed alarm set;with family/visitor present Nurse Communication: Mobility status PT Visit Diagnosis: Other abnormalities of gait and mobility (R26.89);Muscle weakness (generalized) (M62.81);Difficulty in walking, not elsewhere classified (R26.2);Unsteadiness on feet (R26.81);Repeated falls (R29.6);History of falling (Z91.81);Ataxic gait (R26.0);Pain Pain - Right/Left: Left Pain - part of body: Hip     Time: 4827-0786 PT Time Calculation (min) (ACUTE ONLY): 14 min  Charges:                         Katherine Basset, SPT Baker Pierini 02/06/2020, 12:52 PM

## 2020-02-06 NOTE — Progress Notes (Addendum)
Progress Note    Chad [name]  RUE:[id number] DOB: 12/16/1950  DOA: 02/04/2020 PCP: [name], [name], MD      Brief Narrative:    Medical records reviewed and are as summarized below:  Chad [name] is a 69 y.o. male with medical history significant for lumbar degenerative disc disease, GERD, hypertension, anxiety, depression, history of pneumonia, alcohol use disorder, tobacco use disorder, history of gallstone cholecystitis requiring lap cholecystectomy, recent left hip fracture requiring surgical intervention on 01/14/2020. He presented to hospital because of generalized weakness, vomiting, poor oral intake, dehydration, right upper quadrant abdominal pain (said he has had the symptoms gallbladder surgery in September 2021) and mid abdominal pain.   He was admitted to the hospital for alcoholic ketoacidosis, dehydration, alcohol withdrawal syndrome and generalized weakness.  He was treated with IV fluids, benzodiazepines and analgesics.  He also complained of pain in the mid abdomen and pain in the lower side of the right chest but this is actually pain from right upper quadrant area which he attributes to pain from recent gallbladder surgery.    Assessment/Plan:   Principal Problem:   Alcoholic ketoacidosis Active Problems:   Alcohol dependence with uncomplicated withdrawal (HCC)   Lactic acidosis   Dehydration   Generalized weakness  Body mass index is 22.19 kg/m.  PLAN  Alcoholic ketoacidosis/lactic acidosis/ poor oral intake/dehydration: Acidosis has resolved.  Discontinue IV fluids.  Encourage adequate oral intake.  Alcohol withdrawal syndrome: Continue Librium (decrease dose) and Ativan as needed per CIWA protocol.  Generalized weakness: PT recommended discharge to SNF.  OT evaluation.  Hypertension: Patient does not take amlodipine at home.  Monitor BP and resume amlodipine as needed.  Recent cholecystectomy and right hip surgery in September  2021     Diet Order            Diet - low sodium heart healthy           Diet regular Room service appropriate? Yes; Fluid consistency: Thin  Diet effective now                    Consultants:  None  Procedures:  None    Medications:   . chlordiazePOXIDE  25 mg Oral BID  . enoxaparin (LOVENOX) injection  40 mg Subcutaneous Q24H  . folic acid  1 mg Oral Daily  . multivitamin with minerals  1 tablet Oral Daily  . thiamine  100 mg Oral Daily  . traZODone  100 mg Oral QHS   Continuous Infusions:    Anti-infectives (From admission, onward)   None             Family Communication/Anticipated D/C date and plan/Code Status   DVT prophylaxis: enoxaparin (LOVENOX) injection 40 mg Start: 02/04/20 2200 SCDs Start: 02/04/20 1815     Code Status: Full Code  Family Communication: Plan discussed with patient Disposition Plan:    Status is: Inpatient  Remains inpatient appropriate because:Inpatient level of care appropriate due to severity of illness   Dispo: The patient is from: Home              Anticipated d/c is to: Home              Anticipated d/c date is: 1 day              Patient currently is not medically stable to d/c.           Subjective:   Interval events noted.  He is still weak.  According to his nurse, Jodie, patient has been intermittently confused.  Objective:    Vitals:   02/05/20 2011 02/06/20 0404 02/06/20 0833 02/06/20 1225  BP: 112/74 114/76 132/90 113/76  Pulse: 77 72 78 84  Resp: 14 20 (!) 22 16  Temp: 97.6 F (36.4 C) 97.6 F (36.4 C) 97.6 F (36.4 C) 97.9 F (36.6 C)  TempSrc: Oral Oral  Oral  SpO2: 97% 97% 97% 95%  Weight:      Height:       No data found.   Intake/Output Summary (Last 24 hours) at 02/06/2020 1453 Last data filed at 02/06/2020 1300 Gross per 24 hour  Intake 780 ml  Output 1550 ml  Net -770 ml   Filed Weights   02/04/20 0901 02/04/20 2223  Weight: 71 kg 66.2 kg     Exam:  GEN: NAD SKIN: Warm and dry EYES: EOMI ENT: MMM CV: RRR PULM: CTA B ABD: soft, ND, NT, +BS CNS: AAO x 3, non focal EXT: No edema or tenderness     Data Reviewed:   I have personally reviewed following labs and imaging studies:  Labs: Labs show the following:   Basic Metabolic Panel: Recent Labs  Lab 02/04/20 1016 02/04/20 1147 02/05/20 0513  NA 131*  --  135  K 4.6  --  4.4  CL 92*  --  100  CO2 13*  --  27  GLUCOSE 72  --  207*  BUN 10  --  8  CREATININE 0.75  --  0.72  CALCIUM 9.2  --  9.0  MG  --  2.1  --   PHOS  --  2.7  --    GFR Estimated Creatinine Clearance: 83.9 mL/min (by C-G formula based on SCr of 0.72 mg/dL). Liver Function Tests: Recent Labs  Lab 02/04/20 1016  AST 99*  ALT 34  ALKPHOS 120  BILITOT 1.7*  PROT 8.0  ALBUMIN 4.6   Recent Labs  Lab 02/04/20 1016  LIPASE 32   No results for input(s): AMMONIA in the last 168 hours. Coagulation profile No results for input(s): INR, PROTIME in the last 168 hours.  CBC: Recent Labs  Lab 02/04/20 1015 02/05/20 0513  WBC 8.8 5.7  HGB 14.4 12.4*  HCT 43.8 36.5*  MCV 90.7 89.2  PLT 253 PLATELET CLUMPS NOTED ON SMEAR, UNABLE TO ESTIMATE   Cardiac Enzymes: No results for input(s): CKTOTAL, CKMB, CKMBINDEX, TROPONINI in the last 168 hours. BNP (last 3 results) No results for input(s): PROBNP in the last 8760 hours. CBG: No results for input(s): GLUCAP in the last 168 hours. D-Dimer: No results for input(s): DDIMER in the last 72 hours. Hgb A1c: No results for input(s): HGBA1C in the last 72 hours. Lipid Profile: No results for input(s): CHOL, HDL, LDLCALC, TRIG, CHOLHDL, LDLDIRECT in the last 72 hours. Thyroid function studies: No results for input(s): TSH, T4TOTAL, T3FREE, THYROIDAB in the last 72 hours.  Invalid input(s): FREET3 Anemia work up: No results for input(s): VITAMINB12, FOLATE, FERRITIN, TIBC, IRON, RETICCTPCT in the last 72 hours. Sepsis Labs: Recent  Labs  Lab 02/04/20 1015 02/04/20 1148 02/04/20 1914 02/05/20 0513  WBC 8.8  --   --  5.7  LATICACIDVEN  --  3.3* 1.6 1.3    Microbiology Recent Results (from the past 240 hour(s))  Respiratory Panel by RT PCR (Flu A&B, Covid) - Nasopharyngeal Swab     Status: None   Collection Time: 02/04/20 10:08 PM  Specimen: Nasopharyngeal Swab  Result Value Ref Range Status   SARS Coronavirus 2 by RT PCR NEGATIVE NEGATIVE Final    Comment: (NOTE) SARS-CoV-2 target nucleic acids are NOT DETECTED.  The SARS-CoV-2 RNA is generally detectable in upper respiratoy specimens during the acute phase of infection. The lowest concentration of SARS-CoV-2 viral copies this assay can detect is 131 copies/mL. A negative result does not preclude SARS-Cov-2 infection and should not be used as the sole basis for treatment or other patient management decisions. A negative result may occur with  improper specimen collection/handling, submission of specimen other than nasopharyngeal swab, presence of viral mutation(s) within the areas targeted by this assay, and inadequate number of viral copies (<131 copies/mL). A negative result must be combined with clinical observations, patient history, and epidemiological information. The expected result is Negative.  Fact Sheet for Patients:  https://www.moore.com/  Fact Sheet for Healthcare Providers:  https://www.young.biz/  This test is no t yet approved or cleared by the [place] FDA and  has been authorized for detection and/or diagnosis of SARS-CoV-2 by FDA under an Emergency Use Authorization (EUA). This EUA will remain  in effect (meaning this test can be used) for the duration of the COVID-19 declaration under Section 564(b)(1) of the Act, 21 U.S.C. section 360bbb-3(b)(1), unless the authorization is terminated or revoked sooner.     Influenza A by PCR NEGATIVE NEGATIVE Final   Influenza B by PCR NEGATIVE  NEGATIVE Final    Comment: (NOTE) The Xpert Xpress SARS-CoV-2/FLU/RSV assay is intended as an aid in  the diagnosis of influenza from Nasopharyngeal swab specimens and  should not be used as a sole basis for treatment. Nasal washings and  aspirates are unacceptable for Xpert Xpress SARS-CoV-2/FLU/RSV  testing.  Fact Sheet for Patients: https://www.moore.com/  Fact Sheet for Healthcare Providers: https://www.young.biz/  This test is not yet approved or cleared by the [place] FDA and  has been authorized for detection and/or diagnosis of SARS-CoV-2 by  FDA under an Emergency Use Authorization (EUA). This EUA will remain  in effect (meaning this test can be used) for the duration of the  Covid-19 declaration under Section 564(b)(1) of the Act, 21  U.S.C. section 360bbb-3(b)(1), unless the authorization is  terminated or revoked. Performed at [facility], [address], [place], [place] [address]     Procedures and diagnostic studies:  CT Angio Chest PE W and/or Wo Contrast  Result Date: 02/04/2020 CLINICAL DATA:  PE suspected.  Chest pain. EXAM: CT ANGIOGRAPHY CHEST WITH CONTRAST TECHNIQUE: Multidetector CT imaging of the chest was performed using the standard protocol during bolus administration of intravenous contrast. Multiplanar CT image reconstructions and MIPs were obtained to evaluate the vascular anatomy. CONTRAST:  46mL OMNIPAQUE IOHEXOL 350 MG/ML SOLN COMPARISON:  CT dated 12/09/2019. FINDINGS: Cardiovascular: Evaluation is limited by respiratory motion artifact. Given this limitation, no acute pulmonary embolism was detected. The size of the main pulmonary artery is normal. Heart size is normal, with no pericardial effusion. The course and caliber of the aorta are normal. There is mild atherosclerotic calcification. Opacification decreased due to pulmonary arterial phase contrast bolus timing. Mediastinum/Nodes: -- No  mediastinal lymphadenopathy. -- No hilar lymphadenopathy. -- No axillary lymphadenopathy. -- No supraclavicular lymphadenopathy. -- Normal thyroid gland where visualized. -  Unremarkable esophagus. Lungs/Pleura: Airways are patent. No pleural effusion, lobar consolidation, pneumothorax or pulmonary infarction. Upper Abdomen: Contrast bolus timing is not optimized for evaluation of the abdominal organs. The patient is status post prior cholecystectomy.  There is severe hepatic steatosis. Musculoskeletal: No chest wall abnormality. No bony spinal canal stenosis. Review of the MIP images confirms the above findings. IMPRESSION: 1. Evaluation is limited by respiratory motion artifact. Given this limitation, no acute pulmonary embolism was detected. 2. Severe hepatic steatosis. Aortic Atherosclerosis (ICD10-I70.0). Electronically Signed   By: Katherine Mantle M.D.   On: 02/04/2020 16:05               LOS: 2 days   Bentlee Drier  [facility]   Pager on [email or web address]. If 7PM-7AM, please contact night-coverage at [email or web address]     02/06/2020, 2:53 PM

## 2020-02-07 DIAGNOSIS — E876 Hypokalemia: Secondary | ICD-10-CM | POA: Diagnosis not present

## 2020-02-07 LAB — BASIC METABOLIC PANEL
Anion gap: 9 (ref 5–15)
BUN: <5 mg/dL — ABNORMAL LOW (ref 8–23)
CO2: 31 mmol/L (ref 22–32)
Calcium: 9.4 mg/dL (ref 8.9–10.3)
Chloride: 100 mmol/L (ref 98–111)
Creatinine, Ser: 0.71 mg/dL (ref 0.61–1.24)
GFR, Estimated: >60 mL/min (ref >60)
Glucose, Bld: 110 mg/dL — ABNORMAL HIGH (ref 70–99)
Potassium: 3.3 mmol/L — ABNORMAL LOW (ref 3.5–5.1)
Sodium: 140 mmol/L (ref 135–145)

## 2020-02-07 LAB — PHOSPHORUS: Phosphorus: 1.8 mg/dL — ABNORMAL LOW (ref 2.5–4.6)

## 2020-02-07 LAB — MAGNESIUM: Magnesium: 1.7 mg/dL (ref 1.7–2.4)

## 2020-02-07 MED ORDER — SODIUM CHLORIDE 0.9 % IV SOLN: INTRAVENOUS | Status: DC | PRN

## 2020-02-07 MED ORDER — POTASSIUM PHOSPHATES 15 MMOLE/5ML IV SOLN
30.0000 mmol | Freq: Once | INTRAVENOUS | Status: AC
  Administered 2020-02-07: 30 mmol via INTRAVENOUS
  Filled 2020-02-07: qty 10

## 2020-02-07 NOTE — Care Management Important Message (Signed)
Important Message  Patient Details  Name: Chad [name] MRN: [id number] Date of Birth: 05-Jan-1951   Medicare Important Message Given:  Yes     [name] 02/07/2020, 11:08 AM

## 2020-02-07 NOTE — TOC Progression Note (Addendum)
Transition of Care [facility]) - Progression Note    Patient Details  Name: Chad [name] MRN: [id number] Date of Birth: 01-Feb-1951  Transition of Care [facility]) CM/SW Contact  Margarito Liner, LCSW Phone Number: 02/07/2020, 10:26 AM  Clinical Narrative:  Per [facility] admissions coordinator, he was there 9/17-9/20 and then 9/30-10/4 so he's used about 8 days. He has 12 days left that insurance would cover at 100%. No secondary insurance to cover 20% copay after day 20. [facility] will not accept him back. CSW went by room to discuss SNF placement. Patient sleeping soundly. Sitter at bedside said he will be awake to discuss at lunch time.   Expected Discharge Plan: Home/Self Care (PENDING) Barriers to Discharge: Continued Medical Work up  Expected Discharge Plan and Services Expected Discharge Plan: Home/Self Care (PENDING)       Living arrangements for the past 2 months: Apartment Expected Discharge Date: 02/06/20                                     Social Determinants of Health (SDOH) Interventions    Readmission Risk Interventions No flowsheet data found.

## 2020-02-07 NOTE — Progress Notes (Signed)
Progress Note    Chad [name]  SLH:[id number] DOB: 02-03-1951  DOA: 02/04/2020 PCP: Rayetta Humphrey, MD      Brief Narrative:    Medical records reviewed and are as summarized below:  Chad [name] is a 69 y.o. male with medical history significant for lumbar degenerative disc disease, GERD, hypertension, anxiety, depression, history of pneumonia, alcohol use disorder, tobacco use disorder, history of gallstone cholecystitis requiring lap cholecystectomy, recent left hip fracture requiring surgical intervention on 01/14/2020. He presented to hospital because of generalized weakness, vomiting, poor oral intake, dehydration, right upper quadrant abdominal pain (said he has had the symptoms gallbladder surgery in September 2021) and mid abdominal pain.   He was admitted to the hospital for alcoholic ketoacidosis, dehydration, alcohol withdrawal syndrome and generalized weakness.  He was treated with IV fluids, benzodiazepines and analgesics.  He also complained of pain in the mid abdomen and pain in the lower side of the right chest but this is actually pain from right upper quadrant area which he attributes to pain from recent gallbladder surgery.    Assessment/Plan:   Principal Problem:   Alcoholic ketoacidosis Active Problems:   Alcohol dependence with uncomplicated withdrawal (HCC)   Lactic acidosis   Dehydration   Generalized weakness   Hypokalemia   Hypophosphatemia  Body mass index is 22.19 kg/m.  PLAN  Alcoholic ketoacidosis/lactic acidosis/ poor oral intake/dehydration: Acidosis has resolved and IV fluids has been discontinued.  Alcohol withdrawal syndrome with toxic encephalopathy: Continue Librium and Ativan as needed per CIWA protocol.  Hypokalemia and hypophosphatemia: Treat with IV potassium phosphate.  Monitor electrolytes.  Generalized weakness: PT and OT recommended discharge to SNF  Hypertension: Patient does not take amlodipine at home.  Monitor  BP and resume amlodipine as needed.  Recent cholecystectomy and right hip surgery in September 2021     Diet Order            Diet - low sodium heart healthy           Diet regular Room service appropriate? Yes; Fluid consistency: Thin  Diet effective now                    Consultants:  None  Procedures:  None    Medications:   . chlordiazePOXIDE  25 mg Oral BID  . enoxaparin (LOVENOX) injection  40 mg Subcutaneous Q24H  . folic acid  1 mg Oral Daily  . multivitamin with minerals  1 tablet Oral Daily  . thiamine  100 mg Oral Daily  . traZODone  100 mg Oral QHS   Continuous Infusions: . sodium chloride    . potassium PHOSPHATE IVPB (in mmol) 30 mmol (02/07/20 0944)     Anti-infectives (From admission, onward)   None             Family Communication/Anticipated D/C date and plan/Code Status   DVT prophylaxis: enoxaparin (LOVENOX) injection 40 mg Start: 02/04/20 2200 SCDs Start: 02/04/20 1815     Code Status: Full Code  Family Communication: Plan discussed with patient Disposition Plan:    Status is: Inpatient  Remains inpatient appropriate because:Inpatient level of care appropriate due to severity of illness   Dispo: The patient is from: Home              Anticipated d/c is to: Home              Anticipated d/c date is: 1 day  Patient currently is not medically stable to d/c.           Subjective:   Interval events noted.  Patient has been intermittently confused.  She has a Comptroller at the bedside.  Objective:    Vitals:   02/06/20 1948 02/07/20 0442 02/07/20 0743 02/07/20 1121  BP: 137/70 (!) 153/87 139/81 109/72  Pulse: 80 77 72 79  Resp: 18 20 18 18   Temp: 98.1 F (36.7 C) 97.9 F (36.6 C) 97.9 F (36.6 C) 97.8 F (36.6 C)  TempSrc: Oral Oral Oral   SpO2: 95% 97% 97% 98%  Weight:      Height:       No data found.   Intake/Output Summary (Last 24 hours) at 02/07/2020 1431 Last data filed at  02/07/2020 1300 Gross per 24 hour  Intake 240 ml  Output 625 ml  Net -385 ml   Filed Weights   02/04/20 0901 02/04/20 2223  Weight: 71 kg 66.2 kg    Exam:   GEN: NAD SKIN: No rash EYES: EOMI ENT: MMM CV: RRR PULM: CTA B ABD: soft, ND, NT, +BS CNS: Drowsy.  Oriented to self only. EXT: Mild left hip tenderness (from previous surgery).  Surgical wound on left hip has healed nicely.       Data Reviewed:   I have personally reviewed following labs and imaging studies:  Labs: Labs show the following:   Basic Metabolic Panel: Recent Labs  Lab 02/04/20 1016 02/04/20 1016 02/04/20 1147 02/05/20 0513 02/07/20 0439  NA 131*  --   --  135 140  K 4.6   < >  --  4.4 3.3*  CL 92*  --   --  100 100  CO2 13*  --   --  27 31  GLUCOSE 72  --   --  207* 110*  BUN 10  --   --  8 <5*  CREATININE 0.75  --   --  0.72 0.71  CALCIUM 9.2  --   --  9.0 9.4  MG  --   --  2.1  --  1.7  PHOS  --   --  2.7  --  1.8*   < > = values in this interval not displayed.   GFR Estimated Creatinine Clearance: 83.9 mL/min (by C-G formula based on SCr of 0.71 mg/dL). Liver Function Tests: Recent Labs  Lab 02/04/20 1016  AST 99*  ALT 34  ALKPHOS 120  BILITOT 1.7*  PROT 8.0  ALBUMIN 4.6   Recent Labs  Lab 02/04/20 1016  LIPASE 32   No results for input(s): AMMONIA in the last 168 hours. Coagulation profile No results for input(s): INR, PROTIME in the last 168 hours.  CBC: Recent Labs  Lab 02/04/20 1015 02/05/20 0513  WBC 8.8 5.7  HGB 14.4 12.4*  HCT 43.8 36.5*  MCV 90.7 89.2  PLT 253 PLATELET CLUMPS NOTED ON SMEAR, UNABLE TO ESTIMATE   Cardiac Enzymes: No results for input(s): CKTOTAL, CKMB, CKMBINDEX, TROPONINI in the last 168 hours. BNP (last 3 results) No results for input(s): PROBNP in the last 8760 hours. CBG: No results for input(s): GLUCAP in the last 168 hours. D-Dimer: No results for input(s): DDIMER in the last 72 hours. Hgb A1c: No results for input(s):  HGBA1C in the last 72 hours. Lipid Profile: No results for input(s): CHOL, HDL, LDLCALC, TRIG, CHOLHDL, LDLDIRECT in the last 72 hours. Thyroid function studies: No results for input(s): TSH, T4TOTAL, T3FREE, THYROIDAB in  the last 72 hours.  Invalid input(s): FREET3 Anemia work up: No results for input(s): VITAMINB12, FOLATE, FERRITIN, TIBC, IRON, RETICCTPCT in the last 72 hours. Sepsis Labs: Recent Labs  Lab 02/04/20 1015 02/04/20 1148 02/04/20 1914 02/05/20 0513  WBC 8.8  --   --  5.7  LATICACIDVEN  --  3.3* 1.6 1.3    Microbiology Recent Results (from the past 240 hour(s))  Respiratory Panel by RT PCR (Flu A&B, Covid) - Nasopharyngeal Swab     Status: None   Collection Time: 02/04/20 10:08 PM   Specimen: Nasopharyngeal Swab  Result Value Ref Range Status   SARS Coronavirus 2 by RT PCR NEGATIVE NEGATIVE Final    Comment: (NOTE) SARS-CoV-2 target nucleic acids are NOT DETECTED.  The SARS-CoV-2 RNA is generally detectable in upper respiratoy specimens during the acute phase of infection. The lowest concentration of SARS-CoV-2 viral copies this assay can detect is 131 copies/mL. A negative result does not preclude SARS-Cov-2 infection and should not be used as the sole basis for treatment or other patient management decisions. A negative result may occur with  improper specimen collection/handling, submission of specimen other than nasopharyngeal swab, presence of viral mutation(s) within the areas targeted by this assay, and inadequate number of viral copies (<131 copies/mL). A negative result must be combined with clinical observations, patient history, and epidemiological information. The expected result is Negative.  Fact Sheet for Patients:  https://www.moore.com/  Fact Sheet for Healthcare Providers:  https://www.young.biz/  This test is no t yet approved or cleared by the [place] FDA and  has been authorized for  detection and/or diagnosis of SARS-CoV-2 by FDA under an Emergency Use Authorization (EUA). This EUA will remain  in effect (meaning this test can be used) for the duration of the COVID-19 declaration under Section 564(b)(1) of the Act, 21 U.S.C. section 360bbb-3(b)(1), unless the authorization is terminated or revoked sooner.     Influenza A by PCR NEGATIVE NEGATIVE Final   Influenza B by PCR NEGATIVE NEGATIVE Final    Comment: (NOTE) The Xpert Xpress SARS-CoV-2/FLU/RSV assay is intended as an aid in  the diagnosis of influenza from Nasopharyngeal swab specimens and  should not be used as a sole basis for treatment. Nasal washings and  aspirates are unacceptable for Xpert Xpress SARS-CoV-2/FLU/RSV  testing.  Fact Sheet for Patients: https://www.moore.com/  Fact Sheet for Healthcare Providers: https://www.young.biz/  This test is not yet approved or cleared by the [place] FDA and  has been authorized for detection and/or diagnosis of SARS-CoV-2 by  FDA under an Emergency Use Authorization (EUA). This EUA will remain  in effect (meaning this test can be used) for the duration of the  Covid-19 declaration under Section 564(b)(1) of the Act, 21  U.S.C. section 360bbb-3(b)(1), unless the authorization is  terminated or revoked. Performed at [facility], [address] [place], [place] [address]     Procedures and diagnostic studies:  No results found.             LOS: 3 days   Zoie Sarin  [facility]   Pager on [email or web address]. If 7PM-7AM, please contact night-coverage at [email or web address]     02/07/2020, 2:31 PM

## 2020-02-07 NOTE — Evaluation (Signed)
Occupational Therapy Evaluation Patient Details Name: Mikael Skoda MRN: [id number] DOB: 03-19-1951 Today's Date: 02/07/2020    History of Present Illness Jayquon Theiler is a 69yo male with PMHx significant for lumbar degenerative disc disease, GERD, HTN, anxiety, depression, pneumonia, alcohol use disorder, tobacco use disorder, gallston cholecystitis requiring lap cholecystectomy, L hip fx requiring surgical intervention Sept 2021. He presented to hospital with generalized weakness, vomiting, poor oral intake, dehydration, RUQ pain, and mid abdominal pain. He is admitted for alcoholic ketoacidosis, dehydration, alcohol withdrawal syndrome, and generalized weakness.   Clinical Impression   Pt was seen for OT evaluation this date. Pt has a long history of health concerns, including, most recently, a L hip fracture requiring surgical repair 1 month ago. Following this injury, Mr. Yaser Harvill to [facility]. Prior to that, pt had been living alone in a single-story apt. Upon evaluation today, pt presents as alternatingly lethargic and agitated. He is disoriented to time and place. Asked what year it is, Mr. Kalas replied "2026." He largely refuses to engage with therapist, saying he is "taking the day off." Required Mod A and multiple VCs for bed mobility, refused any further activity. Manner is largely hostile. Pt displays tremors in LE and complaints of cold. Sitter is present in room, saying that pt had made repeated attempts to get out of bed. If he is willing to participate, pt could benefit from skilled OT services during hospitalization, to increase functional mobility and improve ability to engage in ADL. Based on today's evaluation, recommend SNF following discharge, although pt reports that there is "no way" he is going back to one of those "shitholes."     Follow Up Recommendations  SNF    Equipment Recommendations       Recommendations for Other Services       Precautions /  Restrictions Precautions Precautions: Fall Precaution Comments: poor safety awareness, high fall risk. Currently has sitter in room. Restrictions Weight Bearing Restrictions: Yes LLE Weight Bearing: Weight bearing as tolerated Other Position/Activity Restrictions: L LE WBAT      Mobility Bed Mobility Overal bed mobility: Needs Assistance Bed Mobility: Rolling;Supine to Sit Rolling: Mod assist   Supine to sit: Mod assist Sit to supine: Mod assist      Transfers                 General transfer comment: Did not perform due to patient's refusal    Balance Overall balance assessment: Needs assistance Sitting-balance support: Bilateral upper extremity supported Sitting balance-Leahy Scale: Poor                                     ADL either performed or assessed with clinical judgement   ADL Overall ADL's : Needs assistance/impaired                                       General ADL Comments: Pt unwilling to engage in functional mobility tasks this day. Abusive and hostile, saying he "aint going to do anything."     Vision Patient Visual Report: No change from baseline       Perception     Praxis      Pertinent Vitals/Pain Pain Location: Pt does not respond to questions regarding pain level     Hand Dominance Right   Extremity/Trunk Assessment Upper  Extremity Assessment Upper Extremity Assessment: Generalized weakness   Lower Extremity Assessment Lower Extremity Assessment: Generalized weakness       Communication Communication Communication:  (pt difficult to understand -- mumbling, incoherent at times)   Cognition Arousal/Alertness: Lethargic Behavior During Therapy: Agitated;Anxious Overall Cognitive Status: History of cognitive impairments - at baseline Area of Impairment: Safety/judgement;Awareness;Orientation;Attention;Problem solving;Following commands                 Orientation Level: Disoriented  to;Time     Following Commands: Follows one step commands inconsistently Safety/Judgement: Decreased awareness of safety     General Comments: Pt is not oriented to either time or place.   General Comments  Pt confused, hostile, demanding    Exercises Other Exercises Other Exercises: provided educ re: role of OT, DC considerations, importance of OOB activities   Shoulder Instructions      Home Living Family/patient expects to be discharged to:: Private residence Living Arrangements: Alone Available Help at Discharge: Friend(s);Available PRN/intermittently Type of Home: Apartment Home Access: Stairs to enter Entrance Stairs-Number of Steps: 13 Entrance Stairs-Rails: Can reach both Home Layout: One level     Bathroom Shower/Tub: Chief Strategy Officer: Standard Bathroom Accessibility: No   Home Equipment: Environmental consultant - 2 wheels;Cane - single point;Shower seat - built in          Prior Functioning/Environment Level of Independence: Independent with assistive device(s)        Comments: Pt is modified independent and uses RW for mobility. He is independent with most I/ADLs (bathing, dressing, cooking, cleaning). Pt reports he doesn't drive or work anymore. Pt reports 1 fall in last 6 months.        OT Problem List: Decreased strength;Pain;Decreased activity tolerance;Decreased safety awareness;Impaired balance (sitting and/or standing);Decreased knowledge of use of DME or AE;Decreased knowledge of precautions;Decreased cognition      OT Treatment/Interventions: Self-care/ADL training;DME and/or AE instruction;Therapeutic activities;Balance training;Patient/family education;Therapeutic exercise    OT Goals(Current goals can be found in the care plan section) Acute Rehab OT Goals Patient Stated Goal: "to go home" OT Goal Formulation: With patient Time For Goal Achievement: 01/29/20 Potential to Achieve Goals: Fair ADL Goals Pt Will Perform Grooming:  sitting;with min guard assist;with set-up Pt Will Perform Upper Body Bathing: sitting;with min guard assist;with set-up Pt Will Transfer to Toilet: with min guard assist (using LRAD)  OT Frequency: Min 1X/week   Barriers to D/C: Decreased caregiver support;Inaccessible home environment  Pt confustion, lack of safety awareness       Co-evaluation              AM-PAC OT "6 Clicks" Daily Activity     Outcome Measure Help from another person eating meals?: A Little Help from another person taking care of personal grooming?: A Little Help from another person toileting, which includes using toliet, bedpan, or urinal?: A Lot Help from another person bathing (including washing, rinsing, drying)?: A Lot Help from another person to put on and taking off regular upper body clothing?: A Little Help from another person to put on and taking off regular lower body clothing?: A Lot 6 Click Score: 15   End of Session    Activity Tolerance: Treatment limited secondary to agitation;Patient limited by lethargy Patient left: in bed;with call bell/phone within reach;with bed alarm set;with nursing/sitter in room  OT Visit Diagnosis: Other abnormalities of gait and mobility (R26.89);Muscle weakness (generalized) (M62.81);History of falling (Z91.81);Pain;Repeated falls (R29.6);Cognitive communication deficit (R41.841)  Time: 7035-0093 OT Time Calculation (min): 12 min Charges:  OT General Charges $OT Visit: 1 Visit OT Evaluation $OT Eval Low Complexity: 1 Low OT Treatments $Self Care/Home Management : 8-22 mins  Latina Craver, PhD, MS, OTR/L ascom [phone] 02/07/20, 10:42 AM

## 2020-02-07 NOTE — Progress Notes (Signed)
Pt very agitated and restless. See MAR. Will obtain 0000 VS at a later time. Pt is now currently resting. Will continue to monitor.

## 2020-02-07 NOTE — Progress Notes (Signed)
Physical Therapy Treatment Patient Details Name: Chad [name] MRN: [id number] DOB: [birth year] Today's Date: 02/07/2020    History of Present Illness Chad [name] is a 69yo male with PMHx significant for lumbar degenerative disc disease, GERD, HTN, anxiety, depression, pneumonia, alcohol use disorder, tobacco use disorder, gallston cholecystitis requiring lap cholecystectomy, L hip fx requiring surgical intervention Sept 2021. He presented to hospital with generalized weakness, vomiting, poor oral intake, dehydration, RUQ pain, and mid abdominal pain. He is admitted for alcoholic ketoacidosis, dehydration, alcohol withdrawal syndrome, and generalized weakness.    PT Comments    Pt presents sleeping in bed on arrival to room. After waking up, pt is hesitantly agreeable to PT session. Pt performed some supine exercises before performing bed mobility. He is minA+1 for supine<>sit as he needed assistance with trunk and LEs. He performed sit<>stand with minA+1 with VCs to push from the bed and not the RW. Pt stood for about 3 minutes before fatiguing and needing to lay back down. Pt is impulsive throughout session as he starts to move before it is safe to. Patient left in bed with all needs and sitter bedside. Pt continues to benefit from skilled PT services to maximize return to PLOF and minimize risk of future falls, injury, caregiver burden, and readmission. Will continue to follow POC. Discharge recommendation remains appropriate.   Follow Up Recommendations  SNF     Equipment Recommendations  None recommended by PT    Recommendations for Other Services       Precautions / Restrictions Precautions Precautions: Fall Precaution Comments: poor safety awareness, high fall risk. Currently has sitter in room. Restrictions Weight Bearing Restrictions: Yes LLE Weight Bearing: Weight bearing as tolerated Other Position/Activity Restrictions: L LE WBAT    Mobility  Bed Mobility Overal bed  mobility: Needs Assistance Bed Mobility: Supine to Sit;Sit to Supine Rolling: Mod assist   Supine to sit: Min assist Sit to supine: Min assist   General bed mobility comments: Pt able to sit EOB with VCs for proper technique. Pt needed assistance with LEs and hand held assist with trunk  Transfers Overall transfer level: Needs assistance Equipment used: Rolling walker (2 wheeled) Transfers: Sit to/from Stand Sit to Stand: Min assist         General transfer comment: Pt able to perform with VCs to push from the bed not the RW  Ambulation/Gait             General Gait Details: Did not perform due to patient's refusal   Stairs             Wheelchair Mobility    Modified Rankin (Stroke Patients Only)       Balance Overall balance assessment: Needs assistance Sitting-balance support: Feet supported Sitting balance-Leahy Scale: Fair Sitting balance - Comments: Pt able to maintain sitting balance with feet supported on floor   Standing balance support: Bilateral upper extremity supported Standing balance-Leahy Scale: Fair Standing balance comment: Pt able to maintain good balance with BUE support on RW                            Cognition Arousal/Alertness: Lethargic Behavior During Therapy: Agitated Overall Cognitive Status: History of cognitive impairments - at baseline Area of Impairment: Safety/judgement;Awareness;Orientation;Attention;Problem solving;Following commands                 Orientation Level: Disoriented to;Time     Following Commands: Follows one step commands inconsistently Safety/Judgement: Decreased awareness  of safety     General Comments: Pt was agitated during session due to not wanting to move and stand up      Exercises General Exercises - Lower Extremity Ankle Circles/Pumps: AROM;Both;10 reps;Supine Straight Leg Raises: AROM;Both;5 reps;Supine Other Exercises Other Exercises: provided educ re: role of  OT, DC considerations, importance of OOB activities    General Comments General comments (skin integrity, edema, etc.): Pt confused, hostile, demanding      Pertinent Vitals/Pain Pain Assessment: No/denies pain Pain Location: Pt does not respond to questions regarding pain level Pain Intervention(s): Monitored during session    Home Living Family/patient expects to be discharged to:: Private residence Living Arrangements: Alone Available Help at Discharge: Friend(s);Available PRN/intermittently Type of Home: Apartment Home Access: Stairs to enter Entrance Stairs-Rails: Can reach both Home Layout: One level Home Equipment: Walker - 2 wheels;Cane - single point;Shower seat - built in      Prior Function Level of Independence: Independent with assistive device(s)      Comments: Pt is modified independent and uses RW for mobility. He is independent with most I/ADLs (bathing, dressing, cooking, cleaning). Pt reports he doesn't drive or work anymore. Pt reports 1 fall in last 6 months.   PT Goals (current goals can now be found in the care plan section) Acute Rehab PT Goals Patient Stated Goal: "to go home" PT Goal Formulation: With patient Time For Goal Achievement: 02/20/20 Potential to Achieve Goals: Fair Progress towards PT goals: Progressing toward goals    Frequency    Min 2X/week      PT Plan Current plan remains appropriate    Co-evaluation              AM-PAC PT "6 Clicks" Mobility   Outcome Measure  Help needed turning from your back to your side while in a flat bed without using bedrails?: A Little Help needed moving from lying on your back to sitting on the side of a flat bed without using bedrails?: A Little Help needed moving to and from a bed to a chair (including a wheelchair)?: A Lot Help needed standing up from a chair using your arms (e.g., wheelchair or bedside chair)?: A Lot Help needed to walk in hospital room?: A Lot Help needed climbing  3-5 steps with a railing? : A Lot 6 Click Score: 14    End of Session Equipment Utilized During Treatment: Gait belt Activity Tolerance: Patient limited by lethargy Patient left: in bed;with call bell/phone within reach;with bed alarm set;with family/visitor present Nurse Communication: Mobility status PT Visit Diagnosis: Other abnormalities of gait and mobility (R26.89);Muscle weakness (generalized) (M62.81);Difficulty in walking, not elsewhere classified (R26.2);Unsteadiness on feet (R26.81);Repeated falls (R29.6);History of falling (Z91.81);Ataxic gait (R26.0);Pain Pain - Right/Left: Left Pain - part of body: Hip     Time: 4097-3532 PT Time Calculation (min) (ACUTE ONLY): 23 min  Charges:                         Katherine Basset, SPT Baker Pierini 02/07/2020, 2:09 PM

## 2020-02-08 DIAGNOSIS — F451 Undifferentiated somatoform disorder: Secondary | ICD-10-CM

## 2020-02-08 DIAGNOSIS — F332 Major depressive disorder, recurrent severe without psychotic features: Secondary | ICD-10-CM

## 2020-02-08 LAB — CBC WITH DIFFERENTIAL/PLATELET
Abs Immature Granulocytes: 0.02 10*3/uL (ref 0.00–0.07)
Basophils Absolute: 0.1 10*3/uL (ref 0.0–0.1)
Basophils Relative: 1 %
Eosinophils Absolute: 0.9 10*3/uL — ABNORMAL HIGH (ref 0.0–0.5)
Eosinophils Relative: 15 %
HCT: 32.3 % — ABNORMAL LOW (ref 39.0–52.0)
Hemoglobin: 10.6 g/dL — ABNORMAL LOW (ref 13.0–17.0)
Immature Granulocytes: 0 %
Lymphocytes Relative: 25 %
Lymphs Abs: 1.4 10*3/uL (ref 0.7–4.0)
MCH: 30.4 pg (ref 26.0–34.0)
MCHC: 32.8 g/dL (ref 30.0–36.0)
MCV: 92.6 fL (ref 80.0–100.0)
Monocytes Absolute: 0.6 10*3/uL (ref 0.1–1.0)
Monocytes Relative: 10 %
Neutro Abs: 2.8 10*3/uL (ref 1.7–7.7)
Neutrophils Relative %: 49 %
Platelets: 125 10*3/uL — ABNORMAL LOW (ref 150–400)
RBC: 3.49 MIL/uL — ABNORMAL LOW (ref 4.22–5.81)
RDW: 15.1 % (ref 11.5–15.5)
WBC: 5.8 10*3/uL (ref 4.0–10.5)
nRBC: 0 % (ref 0.0–0.2)

## 2020-02-08 LAB — PHOSPHORUS: Phosphorus: 4 mg/dL (ref 2.5–4.6)

## 2020-02-08 LAB — BASIC METABOLIC PANEL
Anion gap: 8 (ref 5–15)
BUN: 6 mg/dL — ABNORMAL LOW (ref 8–23)
CO2: 31 mmol/L (ref 22–32)
Calcium: 8.8 mg/dL — ABNORMAL LOW (ref 8.9–10.3)
Chloride: 98 mmol/L (ref 98–111)
Creatinine, Ser: 0.51 mg/dL — ABNORMAL LOW (ref 0.61–1.24)
GFR, Estimated: >60 mL/min (ref >60)
Glucose, Bld: 115 mg/dL — ABNORMAL HIGH (ref 70–99)
Potassium: 3.3 mmol/L — ABNORMAL LOW (ref 3.5–5.1)
Sodium: 137 mmol/L (ref 135–145)

## 2020-02-08 LAB — MAGNESIUM: Magnesium: 1.6 mg/dL — ABNORMAL LOW (ref 1.7–2.4)

## 2020-02-08 MED ORDER — HYDROCODONE-ACETAMINOPHEN 7.5-325 MG PO TABS
1.0000 | ORAL_TABLET | Freq: Four times a day (QID) | ORAL | Status: DC
  Administered 2020-02-08 – 2020-02-10 (×8): 1 via ORAL
  Filled 2020-02-08 (×8): qty 1

## 2020-02-08 MED ORDER — ESCITALOPRAM OXALATE 10 MG PO TABS
20.0000 mg | ORAL_TABLET | Freq: Every day | ORAL | Status: DC
  Administered 2020-02-08: 20 mg via ORAL
  Filled 2020-02-08: qty 2

## 2020-02-08 MED ORDER — MAGNESIUM SULFATE 2 GM/50ML IV SOLN
2.0000 g | Freq: Once | INTRAVENOUS | Status: AC
  Administered 2020-02-08: 2 g via INTRAVENOUS
  Filled 2020-02-08: qty 50

## 2020-02-08 MED ORDER — POTASSIUM CHLORIDE CRYS ER 20 MEQ PO TBCR
40.0000 meq | EXTENDED_RELEASE_TABLET | Freq: Once | ORAL | Status: AC
  Administered 2020-02-08: 40 meq via ORAL
  Filled 2020-02-08: qty 2

## 2020-02-08 MED ORDER — TRAZODONE HCL 50 MG PO TABS
150.0000 mg | ORAL_TABLET | Freq: Every day | ORAL | Status: DC
  Administered 2020-02-08 – 2020-02-09 (×2): 150 mg via ORAL
  Filled 2020-02-08 (×2): qty 1

## 2020-02-08 MED ORDER — LORAZEPAM 2 MG/ML IJ SOLN: 1.0000 mg | Freq: Four times a day (QID) | INTRAMUSCULAR | Status: DC | PRN

## 2020-02-08 MED ORDER — SERTRALINE HCL 50 MG PO TABS
100.0000 mg | ORAL_TABLET | Freq: Every day | ORAL | Status: DC
  Administered 2020-02-08 – 2020-02-09 (×2): 100 mg via ORAL
  Filled 2020-02-08 (×2): qty 2

## 2020-02-08 NOTE — Progress Notes (Signed)
PHARMACY CONSULT NOTE - FOLLOW UP  Pharmacy Consult for Electrolyte Monitoring and Replacement   Recent Labs: Potassium (mmol/L)  Date Value  02/08/2020 3.3 (L)   Magnesium (mg/dL)  Date Value  00/37/0488 1.6 (L)   Calcium (mg/dL)  Date Value  89/16/9450 8.8 (L)   Albumin (g/dL)  Date Value  38/88/2800 4.6   Phosphorus (mg/dL)  Date Value  34/91/7915 4.0   Sodium (mmol/L)  Date Value  02/08/2020 137    Assessment: 69 y.o. male with medical history significant for lumbar degenerative disc disease, GERD, hypertension, anxiety, depression, history of pneumonia, alcohol use disorder, tobacco use disorder,history of gallstone cholecystitis requiring lap cholecystectomy, admitted to the hospital for alcoholic ketoacidosis, dehydration, alcohol withdrawal syndrome and generalized weakness.   Goal of Therapy:  Electrolytes WNL  Plan:   2 grams IV magnesium sulfate x 1  40 mEq oral KCl x 1  Re-check electrolytes in am   Lowella Bandy ,PharmD Clinical Pharmacist 02/08/2020 8:40 AM

## 2020-02-08 NOTE — NC FL2 (Signed)
[place] MEDICAID FL2 LEVEL OF CARE SCREENING TOOL     IDENTIFICATION  Patient Name: Chad [name] Birthdate: 1950-12-28 Sex: male Admission Date (Current Location): 02/04/2020  [facility] and [place] Number:  Chiropodist and Address:  [facility], [address] [place], [place] [address]      Provider Number: [id number]  Attending Physician Name and Address:  Enedina Finner, MD  Relative Name and Phone Number:       Current Level of Care: Hospital Recommended Level of Care: Skilled Nursing Facility Prior Approval Number:    Date Approved/Denied:   PASRR Number: pending  Discharge Plan: SNF    Current Diagnoses: Patient Active Problem List   Diagnosis Date Noted  . Severe recurrent major depression without psychotic features (HCC) 02/08/2020  . 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 Hypokalemia 02/07/2020  . Hypophosphatemia 02/07/2020  . Lactic acidosis 02/04/2020  . Dehydration 02/04/2020  . Generalized weakness 02/04/2020  . Alcohol abuse with intoxication delirium (HCC)   . Anemia   . Closed hip fracture requiring operative repair with routine healing 01/14/2020  . Calculus of gallbladder with acute cholecystitis without obstruction   . GI bleed 12/31/2019  . GIB (gastrointestinal bleeding) 12/31/2019  . Epigastric pain   . Alcohol abuse   . Chest pain   . Cough   . Tobacco abuse   . Alcoholic ketoacidosis 01/23/2019  . Alcohol dependence with uncomplicated withdrawal (HCC) 01/23/2019  . 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 Anxiety and depression 01/23/2019  . HTN (hypertension) 05/17/2018  . Alcohol withdrawal (HCC) 05/17/2018  . DVT (deep venous thrombosis) (HCC) 05/17/2018  . 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 BACK PAIN, LUMBAR 11/10/2006    Orientation RESPIRATION BLADDER Height & Weight     Self, Time, Place  Normal Continent Weight: 66.2  kg Height:  5\' 8"  (172.7 cm)  BEHAVIORAL SYMPTOMS/MOOD NEUROLOGICAL BOWEL NUTRITION STATUS      Continent Diet (regular)  AMBULATORY STATUS COMMUNICATION OF NEEDS Skin   Extensive Assist Verbally Surgical wounds                       Personal Care Assistance Level of Assistance              Functional Limitations Info             SPECIAL CARE FACTORS FREQUENCY  PT (By licensed PT), OT (By licensed OT)                    Contractures Contractures Info: Not present    Additional Factors Info  Code Status, Allergies, Psychotropic Code Status Info: Full Allergies Info: Morphine Ibuprofen Psychotropic Info: anxiety depression         Current Medications (02/08/2020):  This is the current hospital active medication list Current Facility-Administered Medications  Medication Dose Route Frequency Provider Last Rate Last Admin  . 0.9 %  sodium chloride infusion   Intravenous PRN 02/10/2020, MD      . acetaminophen (TYLENOL) tablet 650 mg  650 mg Oral Q6H PRN Lurene Shadow, MD       Or  . acetaminophen (TYLENOL) suppository 650 mg  650 mg Rectal Q6H PRN Lurene Shadow, MD      . chlordiazePOXIDE (LIBRIUM) capsule 25 mg  25 mg Oral BID Lurene Shadow, MD   25 mg at 02/08/20 0750  . enoxaparin (LOVENOX) injection 40 mg  40 mg  Subcutaneous Q24H Lurene Shadow, MD   40 mg at 02/07/20 2136  . folic acid (FOLVITE) tablet 1 mg  1 mg Oral Daily Lurene Shadow, MD   1 mg at 02/08/20 0750  . HYDROcodone-acetaminophen (NORCO) 7.5-325 MG per tablet 1 tablet  1 tablet Oral Q6H Clapacs, John T, MD      . LORazepam (ATIVAN) injection 1 mg  1 mg Intravenous Q6H PRN Enedina Finner, MD      . multivitamin with minerals tablet 1 tablet  1 tablet Oral Daily Lurene Shadow, MD   1 tablet at 02/08/20 0750  . ondansetron (ZOFRAN) tablet 4 mg  4 mg Oral Q6H PRN Lurene Shadow, MD       Or  . ondansetron [facility]) injection 4 mg  4 mg Intravenous Q6H PRN Lurene Shadow, MD   4 mg at  02/04/20 1926  . promethazine (PHENERGAN) injection 12.5 mg  12.5 mg Intravenous Q6H PRN Lurene Shadow, MD   12.5 mg at 02/06/20 2247  . sertraline (ZOLOFT) tablet 100 mg  100 mg Oral Daily Clapacs, John T, MD      . thiamine tablet 100 mg  100 mg Oral Daily Lurene Shadow, MD   100 mg at 02/08/20 0750  . traZODone (DESYREL) tablet 150 mg  150 mg Oral QHS Clapacs, Jackquline Denmark, MD         Discharge Medications: Please see discharge summary for a list of discharge medications.  Relevant Imaging Results:  Relevant Lab Results:   Additional Information SS#: [id number]  Chapman Fitch, RN

## 2020-02-08 NOTE — Progress Notes (Signed)
[facility]  - [place] [facility]   PATIENT NAME: Chad [name]    MR#:  [id number]  DATE OF BIRTH:  24-Dec-1950  SUBJECTIVE:   Patient keeps his eyes closed during the conversation. Sitter in the room. Did eat breakfast. Expresses desire not to live. He has nowhere to go. Lives alone. Has few friends. No close family in touch with him.  REVIEW OF SYSTEMS:   Review of Systems  Constitutional: Positive for malaise/fatigue. Negative for fever and weight loss.  HENT: Negative for ear discharge, ear pain and nosebleeds.   Eyes: Negative for blurred vision, pain and discharge.  Respiratory: Negative for sputum production, shortness of breath, wheezing and stridor.   Cardiovascular: Negative for chest pain, palpitations, orthopnea and PND.  Gastrointestinal: Negative for abdominal pain, diarrhea, nausea and vomiting.  Genitourinary: Negative for frequency and urgency.  Musculoskeletal: Negative for back pain and joint pain.  Neurological: Positive for weakness. Negative for sensory change, speech change and focal weakness.  Psychiatric/Behavioral: Positive for depression and suicidal ideas. Negative for hallucinations. The patient is not nervous/anxious.    Tolerating Diet:yes Tolerating PT: SNF  DRUG ALLERGIES:   Allergies  Allergen Reactions  . Morphine And Related Other (See Comments)    Causes "bad disposition"  . Ibuprofen Other (See Comments)    Gi upset    VITALS:  Blood pressure (!) 91/55, pulse 70, temperature (!) 97.5 F (36.4 C), temperature source Oral, resp. rate 18, height 5\' 8"  (1.727 m), weight 66.2 kg, SpO2 94 %.  PHYSICAL EXAMINATION:   Physical Exam  GENERAL:  69 y.o.-year-old patient lying in the bed with no acute distress. weak EYES: Pupils equal, round, reactive to light and accommodation. No scleral icterus.   HEENT: Head atraumatic, normocephalic. Oropharynx and nasopharynx clear.  NECK:  Supple, no jugular venous distention. No  thyroid enlargement, no tenderness.  LUNGS: Normal breath sounds bilaterally, no wheezing, rales, rhonchi. No use of accessory muscles of respiration.  CARDIOVASCULAR: S1, S2 normal. No murmurs, rubs, or gallops.  ABDOMEN: Soft, nontender, nondistended. Bowel sounds present. No organomegaly or mass.  EXTREMITIES: No cyanosis, clubbing or edema b/l.    NEUROLOGIC: Cranial nerves II through XII are intact. No focal Motor or sensory deficits b/l.   PSYCHIATRIC:  patient is alert and oriented x 2. Mood and affect flat SKIN: No obvious rash, lesion, or ulcer.   LABORATORY PANEL:  CBC Recent Labs  Lab 02/08/20 0700  WBC 5.8  HGB 10.6*  HCT 32.3*  PLT 125*    Chemistries  Recent Labs  Lab 02/04/20 1016 02/04/20 1147 02/08/20 0700  NA 131*   < > 137  K 4.6   < > 3.3*  CL 92*   < > 98  CO2 13*   < > 31  GLUCOSE 72   < > 115*  BUN 10   < > 6*  CREATININE 0.75   < > 0.51*  CALCIUM 9.2   < > 8.8*  MG  --    < > 1.6*  AST 99*  --   --   ALT 34  --   --   ALKPHOS 120  --   --   BILITOT 1.7*  --   --    < > = values in this interval not displayed.   Cardiac Enzymes No results for input(s): TROPONINI in the last 168 hours. RADIOLOGY:  No results found. ASSESSMENT AND PLAN:  Chad [name] is a 69 y.o. male with medical  history significant for lumbar degenerative disc disease, GERD, hypertension, anxiety, depression, history of pneumonia, alcohol use disorder, tobacco use disorder,history of gallstone cholecystitis requiring lap cholecystectomy, recent left hip fracture requiringsurgical intervention on 01/14/2020. He presented to hospital because of generalized weakness, vomiting, poor oral intake, dehydration, right upper quadrant abdominal pain.  Alcoholic ketoacidosis/lactic acidosis/ poor oral intake/dehydration:  -Acidosis has resolved and IV fluids has been discontinued.  Alcohol withdrawal syndrome with toxic encephalopathy:  -Continue Librium and Ativan as needed per  CIWA protocol.  Hypokalemia and hypophosphatemia: - Treat with IV/po potassium phosphate.  Monitor electrolytes. -Pharmacy to replace electrolytes  Depression  -noncompliance to Lexapro -patient has expressed his desire not to live with me this morning. He apparently has no family. Has few friends. Lives alone at home. -Seems unable to take care of himself and has been drinking excessively. -Continue sitter with suicide precautions -Dr. Toni [name] psychiatry consultation placed.  Generalized weakness: PT and OT recommended discharge to SNF -Per TOC patient has signed out himself from rehab places in the past.  Hypertension: -patient does not take amlodipine at home.  Monitor BP and resume amlodipine as needed. -Patient has been noncompliant with his home meds  Recent cholecystectomy and right hip surgery in September 2021   Procedures:none Family communication : none Consults : psychiatry CODE STATUS: full DVT Prophylaxis : Lovenox  Status is: Inpatient  Remains inpatient appropriate because:Unsafe d/c plan   Dispo: The patient is from: Home              Anticipated d/c is to: To be determined              Anticipated d/c date is: 2 days              Patient currently is not medically stable to d/c. came in with acute alcohol intoxication. Mentation improving. Has expressed desire not to live and dealing with depression. Noncompliant with meds. Psychiatry consultation placed. Continue suicide precautions with Sitter TOC for discharge planning      TOTAL TIME TAKING CARE OF THIS PATIENT: 25 minutes.  >50% time spent on counselling and coordination of care  Note: This dictation was prepared with Dragon dictation along with smaller phrase technology. Any transcriptional errors that result from this process are unintentional.  Enedina Finner M.D    [facility]   CC: Primary care physician; Chad [name], MDPatient ID: Chad [name], male   DOB: Aug 20, 1950,  69 y.o.   MRN: [id number]

## 2020-02-08 NOTE — Plan of Care (Signed)
Suicide precautions started at this time. Pt states ever since his phone got tea spilled on it, contacts are gone and he dont want to live anymore. He feels that he wants to go home or here in the hospital and die. Safety sitter in place. Feels that he drinks because he has no one. It helps him cope. Therapeutic communication at this time with patient and Clinical research associate. He states nothing we can say can make him feel better.

## 2020-02-08 NOTE — TOC Progression Note (Signed)
Transition of Care [facility]) - Progression Note    Patient Details  Name: Chad [name] MRN: [id number] Date of Birth: 04-24-1950  Transition of Care [facility]) CM/SW Contact  Chapman Fitch, RN Phone Number: 02/08/2020, 4:14 PM  Clinical Narrative:    Spoke with niece.  She is agreeable to bed search.  States that patient would not want to go back to [facility] ([facility] wont offer anyway)   PASRR went to level 2.  Will need to send additional clinical once psych note is in   Bed search initiated.  Fl2 sent for signature   Expected Discharge Plan: Home/Self Care (PENDING) Barriers to Discharge: Continued Medical Work up  Expected Discharge Plan and Services Expected Discharge Plan: Home/Self Care (PENDING)       Living arrangements for the past 2 months: Apartment Expected Discharge Date: 02/06/20                                     Social Determinants of Health (SDOH) Interventions    Readmission Risk Interventions No flowsheet data found.

## 2020-02-08 NOTE — Progress Notes (Signed)
[facility]  - [place] [facility]   PATIENT NAME: Chad [name]    MR#:  [id number]  DATE OF BIRTH:  1951/04/04  SUBJECTIVE:    REVIEW OF SYSTEMS:   ROS Tolerating Diet: Tolerating PT:   DRUG ALLERGIES:   Allergies  Allergen Reactions  . Morphine And Related Other (See Comments)    Causes "bad disposition"  . Ibuprofen Other (See Comments)    Gi upset    VITALS:  Blood pressure (!) 91/55, pulse 70, temperature (!) 97.5 F (36.4 C), temperature source Oral, resp. rate 18, height 5\' 8"  (1.727 m), weight 66.2 kg, SpO2 94 %.  PHYSICAL EXAMINATION:   Physical Exam  GENERAL:  69 y.o.-year-old patient lying in the bed with no acute distress.  EYES: Pupils equal, round, reactive to light and accommodation. No scleral icterus.   HEENT: Head atraumatic, normocephalic. Oropharynx and nasopharynx clear.  NECK:  Supple, no jugular venous distention. No thyroid enlargement, no tenderness.  LUNGS: Normal breath sounds bilaterally, no wheezing, rales, rhonchi. No use of accessory muscles of respiration.  CARDIOVASCULAR: S1, S2 normal. No murmurs, rubs, or gallops.  ABDOMEN: Soft, nontender, nondistended. Bowel sounds present. No organomegaly or mass.  EXTREMITIES: No cyanosis, clubbing or edema b/l.    NEUROLOGIC: Cranial nerves II through XII are intact. No focal Motor or sensory deficits b/l.   PSYCHIATRIC:  patient is alert and oriented x 3.  SKIN: No obvious rash, lesion, or ulcer.   LABORATORY PANEL:  CBC Recent Labs  Lab 02/08/20 0700  WBC 5.8  HGB 10.6*  HCT 32.3*  PLT 125*    Chemistries  Recent Labs  Lab 02/04/20 1016 02/04/20 1147 02/08/20 0700  NA 131*   < > 137  K 4.6   < > 3.3*  CL 92*   < > 98  CO2 13*   < > 31  GLUCOSE 72   < > 115*  BUN 10   < > 6*  CREATININE 0.75   < > 0.51*  CALCIUM 9.2   < > 8.8*  MG  --    < > 1.6*  AST 99*  --   --   ALT 34  --   --   ALKPHOS 120  --   --   BILITOT 1.7*  --   --    < > = values in  this interval not displayed.   Cardiac Enzymes No results for input(s): TROPONINI in the last 168 hours. RADIOLOGY:  No results found. ASSESSMENT AND PLAN:  Chad [name] is a 69 y.o. male with medical history significant for lumbar degenerative disc disease, GERD, hypertension, anxiety, depression, history of pneumonia, alcohol use disorder, tobacco use disorder,history of gallstone cholecystitis requiring lap cholecystectomy, recent left hip fracture requiringsurgical intervention on 01/14/2020. He presented to hospital because of generalized weakness, vomiting, poor oral intake, dehydration, right upper quadrant abdominal pain.  Alcoholic ketoacidosis/lactic acidosis/ poor oral intake/dehydration:  -Acidosis has resolved and IV fluids has been discontinued.  Alcohol withdrawal syndrome with toxic encephalopathy:  -Continue Librium and Ativan as needed per CIWA protocol.  Hypokalemia and hypophosphatemia: - Treat with IV/po potassium phosphate.  Monitor electrolytes. -Pharmacy to replace electrolytes  Depression  -noncompliance to Lexapro -patient has expressed his desire not to live with me this morning. He apparently has no family. Has few friends. Lives alone at home. -Seems unable to take care of himself and has been drinking excessively. -Continue sitter with suicide precautions -Dr. 01/16/2020 psychiatry consultation placed.  Generalized weakness: PT and OT recommended discharge to SNF -Per TOC patient has signed out himself from rehab places in the past.  Hypertension: -patient does not take amlodipine at home.  Monitor BP and resume amlodipine as needed. -Patient has been noncompliant with his home meds  Recent cholecystectomy and right hip surgery in September 2021   Procedures:none Family communication : none Consults : psychiatry CODE STATUS: full DVT Prophylaxis : Lovenox  Status is: Inpatient  Remains inpatient appropriate because:Unsafe d/c  plan   Dispo: The patient is from: Home              Anticipated d/c is to: To be determined              Anticipated d/c date is: 2 days              Patient currently is not medically stable to d/c. came in with acute alcohol intoxication. Mentation improving. Has expressed desire not to live and dealing with depression. Noncompliant with meds. Psychiatry consultation placed. Continue suicide precautions with Sitter TOC for discharge planning      TOTAL TIME TAKING CARE OF THIS PATIENT: 25 minutes.  >50% time spent on counselling and coordination of care  Note: This dictation was prepared with Dragon dictation along with smaller phrase technology. Any transcriptional errors that result from this process are unintentional.  Enedina Finner M.D    [facility]   CC: Primary care physician; Rayetta Humphrey, MDPatient ID: Chad [name], male   DOB: 05/12/1950, 69 y.o.   MRN: [id number]

## 2020-02-08 NOTE — TOC Progression Note (Signed)
Transition of Care [facility]) - Progression Note    Patient Details  Name: Chad [name] MRN: [id number] Date of Birth: 12/09/1950  [facility] CM/SW Contact  Chapman Fitch, RN Phone Number: 02/08/2020, 11:47 AM  Clinical Narrative:     Patient currently not A&O x4.  Voicemail left for niece Larena Glassman to discuss SNF recommendation.  Awaiting return call  Expected Discharge Plan: Home/Self Care (PENDING) Barriers to Discharge: Continued Medical Work up  Expected Discharge Plan and Services Expected Discharge Plan: Home/Self Care (PENDING)       Living arrangements for the past 2 months: Apartment Expected Discharge Date: 02/06/20                                     Social Determinants of Health (SDOH) Interventions    Readmission Risk Interventions No flowsheet data found.
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
## 2020-02-09 LAB — RENAL FUNCTION PANEL
Albumin: 2.9 g/dL — ABNORMAL LOW (ref 3.5–5.0)
Anion gap: 9 (ref 5–15)
BUN: 9 mg/dL (ref 8–23)
CO2: 28 mmol/L (ref 22–32)
Calcium: 8.6 mg/dL — ABNORMAL LOW (ref 8.9–10.3)
Chloride: 100 mmol/L (ref 98–111)
Creatinine, Ser: 0.62 mg/dL (ref 0.61–1.24)
GFR, Estimated: >60 mL/min (ref >60)
Glucose, Bld: 114 mg/dL — ABNORMAL HIGH (ref 70–99)
Phosphorus: 4.1 mg/dL (ref 2.5–4.6)
Potassium: 4 mmol/L (ref 3.5–5.1)
Sodium: 137 mmol/L (ref 135–145)

## 2020-02-09 LAB — MAGNESIUM: Magnesium: 2 mg/dL (ref 1.7–2.4)

## 2020-02-09 MED ORDER — NICOTINE 21 MG/24HR TD PT24
21.0000 mg | MEDICATED_PATCH | Freq: Every day | TRANSDERMAL | Status: DC
  Administered 2020-02-09 – 2020-02-10 (×2): 21 mg via TRANSDERMAL
  Filled 2020-02-09 (×2): qty 1

## 2020-02-09 MED ORDER — SERTRALINE HCL 50 MG PO TABS
150.0000 mg | ORAL_TABLET | Freq: Every day | ORAL | Status: DC
  Administered 2020-02-10: 150 mg via ORAL
  Filled 2020-02-09: qty 3

## 2020-02-09 NOTE — Progress Notes (Signed)
Mobility Specialist - Progress Note   02/09/20 1500  Mobility  Activity Ambulated in room  Level of Assistance Standby assist, set-up cues, supervision of patient - no hands on  Assistive Device Front wheel walker  Distance Ambulated (ft) 10 ft  Mobility Response Tolerated well  Mobility performed by Mobility specialist  $Mobility charge 1 Mobility     Pt was sitting in recliner upon arrival. Pt initially declined session stating he had worked with PT earlier. However, pt then requested assistance getting to the sink to brush his teeth. Pt appears agitated in mood, stating he's been "unhappy with the way he's been treated here". Pt denied any pain, nausea, or fatigue. Psychiatrist entered room mid-session. Pt was able to stand to RW with minA, and ambulated 10' in room SBA where he brushed his teeth and washed face. Mobility assisted with set-up. Overall, pt tolerated well. Pt requested to return to bed at the end of session. Pt has all needs in reach and bed alarm was set. NT was notified for cath replacement.    Filiberto Pinks Mobility Specialist 02/09/20, 3:34 PM

## 2020-02-09 NOTE — Progress Notes (Signed)
[facility]  - [place] at [facility]   PATIENT NAME: Chad [name]    MR#:  [id number]  DATE OF BIRTH:  02-Apr-1951  SUBJECTIVE:   Patient more awake alert having meaningful conversation. Feels weak. Attempting to eat his fruit tray. No family in the room. Telesitter+ denies any suicidal ideation. Mood stable. Per RN no new issues. Nicotine patch earlier prescribed.  REVIEW OF SYSTEMS:   Review of Systems  Constitutional: Positive for malaise/fatigue. Negative for fever and weight loss.  HENT: Negative for ear discharge, ear pain and nosebleeds.   Eyes: Negative for blurred vision, pain and discharge.  Respiratory: Negative for sputum production, shortness of breath, wheezing and stridor.   Cardiovascular: Negative for chest pain, palpitations, orthopnea and PND.  Gastrointestinal: Negative for abdominal pain, diarrhea, nausea and vomiting.  Genitourinary: Negative for frequency and urgency.  Musculoskeletal: Negative for back pain and joint pain.  Neurological: Positive for weakness. Negative for sensory change, speech change and focal weakness.  Psychiatric/Behavioral: Positive for depression. Negative for hallucinations. The patient is not nervous/anxious.    Tolerating Diet:yes Tolerating PT: SNF  DRUG ALLERGIES:   Allergies  Allergen Reactions  . Morphine And Related Other (See Comments)    Causes "bad disposition"  . Ibuprofen Other (See Comments)    Gi upset    VITALS:  Blood pressure 103/76, pulse 65, temperature 98.8 F (37.1 C), temperature source Oral, resp. rate 20, height 5\' 8"  (1.727 m), weight 66.2 kg, SpO2 96 %.  PHYSICAL EXAMINATION:   Physical Exam  GENERAL:  69 y.o.-year-old patient lying in the bed with no acute distress. weak EYES: Pupils equal, round, reactive to light and accommodation. No scleral icterus.   HEENT: Head atraumatic, normocephalic. Oropharynx and nasopharynx clear.  NECK:  Supple, no jugular venous distention. No  thyroid enlargement, no tenderness.  LUNGS: Normal breath sounds bilaterally, no wheezing, rales, rhonchi. No use of accessory muscles of respiration.  CARDIOVASCULAR: S1, S2 normal. No murmurs, rubs, or gallops.  ABDOMEN: Soft, nontender, nondistended. Bowel sounds present. No organomegaly or mass.  EXTREMITIES: No cyanosis, clubbing or edema b/l.    NEUROLOGIC: Cranial nerves II through XII are intact. No focal Motor or sensory deficits b/l.   PSYCHIATRIC:  patient is alert and oriented x 2. Mood and affect flat SKIN: No obvious rash, lesion, or ulcer.   LABORATORY PANEL:  CBC Recent Labs  Lab 02/08/20 0700  WBC 5.8  HGB 10.6*  HCT 32.3*  PLT 125*    Chemistries  Recent Labs  Lab 02/04/20 1016 02/04/20 1147 02/09/20 0553  NA 131*   < > 137  K 4.6   < > 4.0  CL 92*   < > 100  CO2 13*   < > 28  GLUCOSE 72   < > 114*  BUN 10   < > 9  CREATININE 0.75   < > 0.62  CALCIUM 9.2   < > 8.6*  MG  --    < > 2.0  AST 99*  --   --   ALT 34  --   --   ALKPHOS 120  --   --   BILITOT 1.7*  --   --    < > = values in this interval not displayed.   Cardiac Enzymes No results for input(s): TROPONINI in the last 168 hours. RADIOLOGY:  No results found. ASSESSMENT AND PLAN:  Chad [name] is a 69 y.o. male with medical history significant for lumbar degenerative disc  disease, GERD, hypertension, anxiety, depression, history of pneumonia, alcohol use disorder, tobacco use disorder,history of gallstone cholecystitis requiring lap cholecystectomy, recent left hip fracture requiringsurgical intervention on 01/14/2020. He presented to hospital because of generalized weakness, vomiting, poor oral intake, dehydration, right upper quadrant abdominal pain.  Alcoholic ketoacidosis/lactic acidosis/ poor oral intake/dehydration:  -Acidosis has resolved and IV fluids has been discontinued. -- Tolerating PO diet  Alcohol withdrawal syndrome with toxic encephalopathy:  -Continue Librium and  Ativan as needed per CIWA protocol. -Scoring pretty low on ciwa  Hypokalemia and hypophosphatemia: -Pharmacy to replace electrolytes-- repleted  Depression  -noncompliance to Lexapro at home -patient has expressed his desire not to live with me this morning. He apparently has no family. Has few friends. Lives alone at home. -Seems unable to take care of himself and has been drinking excessively. -No suicidal ideation. DC sitter -Dr. Toni Amend psychiatry consultation-- appreciated. Patient started on Zoloft hundred milligrams daily  Generalized weakness: PT and OT recommended discharge to SNF -Per TOC patient has signed out himself from rehab places in the past. So far no bed offers have been made. -I have discussed with patient if we do not have any bed offers in a day or two he may end up going home with home health if I'm able to arrange it. -TOC for discharge planning  Hypertension: -patient does not take amlodipine at home.  -Blood pressure soft. Hold antihypertensives  Recent cholecystectomy and right hip surgery in September 2021   Procedures:none Family communication : left message for Consuella Lose patient's niece. CSW has discussed with Consuella Lose discharge planning options Consults : psychiatry CODE STATUS: full DVT Prophylaxis : Lovenox  Status is: Inpatient  Remains inpatient appropriate because:Unsafe d/c plan patient is ready for discharge to rehab. So far no offers have been made. Patient may end up going home with home health.  Dispo: The patient is from: Home              Anticipated d/c is to: To be determined              Anticipated d/c date is: 2 days         patient currently best at baseline for discharge. Slowly improving with physical therapy  Sitter discontinued.     TOTAL TIME TAKING CARE OF THIS PATIENT: 25 minutes.  >50% time spent on counselling and coordination of care  Note: This dictation was prepared with Dragon dictation along with smaller  phrase technology. Any transcriptional errors that result from this process are unintentional.  Enedina Finner M.D    [facility]   CC: Primary care physician; Rayetta Humphrey, MDPatient ID: Chad [name], male   DOB: [birth year], 69 y.o.   MRN: [id number]

## 2020-02-09 NOTE — Progress Notes (Signed)
PHARMACY CONSULT NOTE  Pharmacy Consult for Electrolyte Monitoring and Replacement   Recent Labs: Potassium (mmol/L)  Date Value  02/09/2020 4.0   Magnesium (mg/dL)  Date Value  78/29/5621 2.0   Calcium (mg/dL)  Date Value  30/86/5784 8.6 (L)   Albumin (g/dL)  Date Value  69/62/9528 2.9 (L)   Phosphorus (mg/dL)  Date Value  41/32/4401 4.1   Sodium (mmol/L)  Date Value  02/09/2020 137   Corrected Ca: 9.48 mg/dL  Assessment: 69 y.o. male with medical history significant for lumbar degenerative disc disease, GERD, hypertension, anxiety, depression, history of pneumonia, alcohol use disorder, tobacco use disorder,history of gallstone cholecystitis requiring lap cholecystectomy, admitted to the hospital for alcoholic ketoacidosis, dehydration, alcohol withdrawal syndrome and generalized weakness.   Goal of Therapy:  Electrolytes WNL  Plan:   No electrolyte replacement warranted today  Re-check electrolytes in am   Lowella Bandy ,PharmD Clinical Pharmacist 02/09/2020 8:03 AM

## 2020-02-09 NOTE — Progress Notes (Addendum)
Physical Therapy Treatment Patient Details Name: Chad [name] MRN: [id number] DOB: 24-Oct-1950 Today's Date: 02/09/2020    History of Present Illness Chad [name] is a 69yo male with PMHx significant for lumbar degenerative disc disease, GERD, HTN, anxiety, depression, pneumonia, alcohol use disorder, tobacco use disorder, gallston cholecystitis requiring lap cholecystectomy, L hip fx requiring surgical intervention Sept 2021. He presented to hospital with generalized weakness, vomiting, poor oral intake, dehydration, RUQ pain, and mid abdominal pain. He is admitted for alcoholic ketoacidosis, dehydration, alcohol withdrawal syndrome, and generalized weakness.    PT Comments    Pt presents supine in bed on arrival to room and is agreeable to PT session. Pt performed supine exercises before mobilizing. During bed mobility, his catheter becomes completely unattached and nursing notified. Pt able to sit EOB with VCs for proper technique. Pt only needed hand held assist with trunk as he has weak core strength. Pt performs sit<>stand with minA+1 and requires VCs to push off bed now RW. Pt walked 20 feet with step through gait and decreased step length bilaterally. VCs needed throughout to keep walker close by. Pt limited due to fatigue and decreased endurance. Pt demonstrated good balance with no UE support when he was cleaning himself and CGA only used for safety. Once in room, pt sits in chair and is left with all needs. Nursing notified to put catheter back on patient. Per MD, efforts relayed to care team for multiple disciplines to mobilize this patient with efforts to continue to make progression via secure chat including RN, therapist, MD, and mobility specialist. Pt continues to benefit from skilled PT services to maximize return to PLOF and minimize risk of future falls, injury, caregiver burden, and readmission. Will continue to follow POC. Discharge recommendation remains appropriate.   Follow  Up Recommendations  SNF     Equipment Recommendations  None recommended by PT    Recommendations for Other Services       Precautions / Restrictions Precautions Precautions: Fall Precaution Comments: Pt has telesitter in room. His safety awareness has increased as he follows commands Restrictions Weight Bearing Restrictions: Yes LLE Weight Bearing: Weight bearing as tolerated    Mobility  Bed Mobility Overal bed mobility: Modified Independent Bed Mobility: Supine to Sit     Supine to sit: Min assist     General bed mobility comments: Pt able to sit EOB with VCs for proper technique. Pt only needed hand held assist with trunk as he has weak core strength  Transfers Overall transfer level: Needs assistance Equipment used: Rolling walker (2 wheeled) Transfers: Sit to/from Stand Sit to Stand: Min assist         General transfer comment: Pt able to perform with VCs to push from the bed not the RW.  Ambulation/Gait Ambulation/Gait assistance: Min guard Gait Distance (Feet): 20 Feet Assistive device: Rolling walker (2 wheeled) Gait Pattern/deviations: Step-through pattern;Decreased step length - right;Decreased step length - left     General Gait Details: Pt walked with step through gait and decreased step length bilaterally. VCs needed throughout to keep walker close by. Pt limited due to fatigue, decreased strength and endurance.   Stairs             Wheelchair Mobility    Modified Rankin (Stroke Patients Only)       Balance Overall balance assessment: Needs assistance Sitting-balance support: Feet supported Sitting balance-Leahy Scale: Good Sitting balance - Comments: Pt able to maintain sitting balance with feet supported on floor. No posterior lean  noted once patient is at EOB   Standing balance support: Bilateral upper extremity supported;No upper extremity supported Standing balance-Leahy Scale: Good Standing balance comment: Pt able to  maintain good balance with BUE support on RW. Pt also demonstrated good balance with no UE when he was cleaning himself                            Cognition Arousal/Alertness: Awake/alert Behavior During Therapy: Flat affect Overall Cognitive Status: History of cognitive impairments - at baseline                                 General Comments: Pt follows commands well this session. He does not demonstrate any agitation this session but continues to have flat affect.      Exercises General Exercises - Lower Extremity Ankle Circles/Pumps: AROM;Both;10 reps;Supine Long Arc Quad: AROM;Both;10 reps;Seated Straight Leg Raises: AROM;Both;10 reps;Seated    General Comments        Pertinent Vitals/Pain Pain Assessment: No/denies pain    Home Living                      Prior Function            PT Goals (current goals can now be found in the care plan section) Acute Rehab PT Goals Patient Stated Goal: "to go home" PT Goal Formulation: With patient Time For Goal Achievement: 02/20/20 Potential to Achieve Goals: Fair Progress towards PT goals: Progressing toward goals    Frequency    Min 2X/week      PT Plan Current plan remains appropriate    Co-evaluation              AM-PAC PT "6 Clicks" Mobility   Outcome Measure  Help needed turning from your back to your side while in a flat bed without using bedrails?: A Little Help needed moving from lying on your back to sitting on the side of a flat bed without using bedrails?: A Little Help needed moving to and from a bed to a chair (including a wheelchair)?: A Little Help needed standing up from a chair using your arms (e.g., wheelchair or bedside chair)?: A Little Help needed to walk in hospital room?: A Little Help needed climbing 3-5 steps with a railing? : A Lot 6 Click Score: 17    End of Session Equipment Utilized During Treatment: Gait belt Activity Tolerance: Patient  limited by fatigue Patient left: in chair;with call bell/phone within reach;with chair alarm set Nurse Communication: Mobility status PT Visit Diagnosis: Other abnormalities of gait and mobility (R26.89);Muscle weakness (generalized) (M62.81);Difficulty in walking, not elsewhere classified (R26.2);Unsteadiness on feet (R26.81);Repeated falls (R29.6);History of falling (Z91.81);Ataxic gait (R26.0);Pain Pain - Right/Left: Left Pain - part of body: Hip     Time: 7782-4235 PT Time Calculation (min) (ACUTE ONLY): 39 min  Charges:                         Katherine Basset, SPT Baker Pierini 02/09/2020, 4:34 PM

## 2020-02-09 NOTE — Progress Notes (Addendum)
Pt informed writer not to woke him up for 12am vitals, pt wanted to sleep. Will continue to monitor.
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
## 2020-02-10 DIAGNOSIS — F332 Major depressive disorder, recurrent severe without psychotic features: Secondary | ICD-10-CM

## 2020-02-10 LAB — BASIC METABOLIC PANEL WITH GFR
Anion gap: 9 (ref 5–15)
BUN: 10 mg/dL (ref 8–23)
CO2: 28 mmol/L (ref 22–32)
Calcium: 9 mg/dL (ref 8.9–10.3)
Chloride: 100 mmol/L (ref 98–111)
Creatinine, Ser: 0.75 mg/dL (ref 0.61–1.24)
GFR, Estimated: >60 mL/min
Glucose, Bld: 97 mg/dL (ref 70–99)
Potassium: 4.1 mmol/L (ref 3.5–5.1)
Sodium: 137 mmol/L (ref 135–145)

## 2020-02-10 LAB — MAGNESIUM: Magnesium: 2.1 mg/dL (ref 1.7–2.4)

## 2020-02-10 MED ORDER — SERTRALINE HCL 50 MG PO TABS
150.0000 mg | ORAL_TABLET | Freq: Every day | ORAL | 2 refills | Status: DC
Start: 2020-02-11 — End: 2020-10-02

## 2020-02-10 MED ORDER — HYDROCODONE-ACETAMINOPHEN 7.5-325 MG PO TABS: 1.0000 | ORAL_TABLET | Freq: Four times a day (QID) | ORAL | 0 refills | Status: DC | PRN

## 2020-02-10 NOTE — TOC Progression Note (Addendum)
Transition of Care [facility]) - Progression Note    Patient Details  Name: Chad [name] MRN: [id number] Date of Birth: April 28, 1950  [facility]) CM/SW Contact  Margarito Liner, LCSW Phone Number:  02/10/2020, 11:49 AM  Clinical Narrative: Provided patient with bed offers: [facility] in [place] and [address] in [place]. Patient wants to know if [facility] allows smoking and said that would determine if he goes to SNF vs. Home with home health. [facility] does not allow smoking. He did not have home health services prior to admission. Made referral to [facility]. They will notify CSW of decision. His friend is at bedside and will take him home.  12:46 pm: [facility] is able to accept patient for PT and OT. Patient is aware and agreeable. Information added to AVS.  Expected Discharge Plan: Home/Self Care (PENDING) Barriers to Discharge: Continued Medical Work up  Expected Discharge Plan and Services Expected Discharge Plan: Home/Self Care (PENDING)       Living arrangements for the past 2 months: Apartment Expected Discharge Date: 02/06/20                                     Social Determinants of Health (SDOH) Interventions    Readmission Risk Interventions No flowsheet data found.

## 2020-02-10 NOTE — Discharge Summary (Signed)
[facility] - [place] at [facility]   PATIENT NAME: Chad [name]    MR#:  [id number]  DATE OF BIRTH:  Aug 03, 1950  DATE OF ADMISSION:  02/04/2020 ADMITTING PHYSICIAN: Lurene Shadow, MD  DATE OF DISCHARGE: 02/10/2020  PRIMARY CARE PHYSICIAN: Rayetta Humphrey, MD    ADMISSION DIAGNOSIS:  Lactic acidosis [E87.2] Metabolic acidosis [E87.2] Nonspecific chest pain [R07.9]  DISCHARGE DIAGNOSIS:  Alcohol ketoacidosis with alcoholic withdrawal syndrome and encephalopathy-- resolved depression generalized weakness electrolyte abnormality-- Repleted SECONDARY DIAGNOSIS:   Past Medical History:  Diagnosis Date  . Anxiety    takes lexapro  . Chronic kidney disease    patient states he was told he has 'moderate kidney disease'  . Degenerative disc disease, lumbar   . Exposure to hepatitis C   . GERD (gastroesophageal reflux disease)    takes OTC as needed - omeprazole  . Headache    on occasion  . History of gallstones   . Hypertension    diet controlled  . Pneumonia approx 15 years ago    HOSPITAL COURSE:   Chad [name] a 69 y.o.malewith medical history significant for lumbar degenerative disc disease, GERD, hypertension, anxiety, depression, history of pneumonia, alcohol use disorder, tobacco use disorder,history of gallstone cholecystitis requiring lap cholecystectomy, recent left hip fracture requiringsurgical intervention on 01/14/2020. He presented to hospital because of generalized weakness, vomiting, poor oral intake, dehydration, right upper quadrant abdominal pain.  Alcoholic ketoacidosis/lactic acidosis/ poor oral intake/dehydration: -- Acidosis has resolved and IV fluids has been discontinued. --Tolerating PO diet --overall improved.  Alcohol withdrawal syndromewith toxic encephalopathy: --Continue Librium --Scoring pretty low on ciwa-- discontinue Ativan  Hypokalemia and hypophosphatemia: -- repleted  Depression   -noncompliance to Lexapro at home --No suicidal ideation. DC sitter -Dr. Toni Amend psychiatry consultation-- appreciated. Patient started on Zoloft hundred milligrams daily-- to be helping him.  Generalized weakness:PT and OT recommended discharge to SNF -Per TOC patient has signed out himself from rehab places in the past. So far no bed offers have been made. --TOC for discharge planning-- TOC patient has two bed offers.   Hypertension: -patient does not take amlodipine at home.  -Blood pressure soft. Hold antihypertensives  Recent cholecystectomy and right hip surgery in September 2021   Procedures:none Family communication : none  consults : psychiatry CODE STATUS: full DVT Prophylaxis : Lovenox  Status is: Inpatient Dispo: The patient is from: Home  Anticipated d/c is to: [facility] with [facility]  patient currently is best at baseline. He is ambulating with mobility supervisor MPT appropriately. Patient has decided he wants to go home. He is refusing to go to rehab. Social worker is arranged home health PT OT. Discussed with patient to abstain from drinking. He is recommended to follow-up with his primary care. Patient did voice understanding and acknowledge discharge instruction CONSULTS OBTAINED:  Treatment Team:  Clapacs, Jackquline Denmark, MD  DRUG ALLERGIES:   Allergies  Allergen Reactions  . Morphine And Related Other (See Comments)    Causes "bad disposition"  . Ibuprofen Other (See Comments)    Gi upset    DISCHARGE MEDICATIONS:   Allergies as of 02/10/2020      Reactions   Morphine And Related Other (See Comments)   Causes "bad disposition"   Ibuprofen Other (See Comments)   Gi upset      Medication List    STOP taking these medications   amLODipine 5 MG tablet Commonly known as: NORVASC   escitalopram 20 MG tablet Commonly known as: LEXAPRO  TAKE these medications   acetaminophen 325 MG tablet Commonly known as: TYLENOL Take 2  tablets (650 mg total) by mouth every 6 (six) hours as needed for mild pain (pain score 1-3 or temp > 100.5).   feeding supplement Liqd Take 237 mLs by mouth 2 (two) times daily between meals.   folic acid 1 MG tablet Commonly known as: FOLVITE Take 1 tablet (1 mg total) by mouth daily.   HYDROcodone-acetaminophen 7.5-325 MG tablet Commonly known as: NORCO Take 1 tablet by mouth every 6 (six) hours as needed for moderate pain.   hydrOXYzine 25 MG capsule Commonly known as: Vistaril Take 1 capsule (25 mg total) by mouth 3 (three) times daily as needed for anxiety or itching.   nicotine 21 mg/24hr patch Commonly known as: NICODERM CQ - dosed in mg/24 hours One 21mg  patch chest wall daily (okay to substitute generic)   pantoprazole 40 MG tablet Commonly known as: Protonix Take 1 tablet (40 mg total) by mouth 2 (two) times daily.   sertraline 50 MG tablet Commonly known as: ZOLOFT Take 3 tablets (150 mg total) by mouth daily. Start taking on: February 11, 2020   thiamine 100 MG tablet Take 1 tablet (100 mg total) by mouth daily.   traZODone 100 MG tablet Commonly known as: DESYREL Take 1 tablet (100 mg total) by mouth at bedtime.            Discharge Care Instructions  (From admission, onward)         Start     Ordered   02/10/20 0000  Discharge wound care:       Comments: none   02/10/20 1445          If you experience worsening of your admission symptoms, develop shortness of breath, life threatening emergency, suicidal or homicidal thoughts you must seek medical attention immediately by calling 911 or calling your MD immediately  if symptoms less severe.  You Must read complete instructions/literature along with all the possible adverse reactions/side effects for all the Medicines you take and that have been prescribed to you. Take any new Medicines after you have completely understood and accept all the possible adverse reactions/side effects.   Please  note  You were cared for by a hospitalist during your hospital stay. If you have any questions about your discharge medications or the care you received while you were in the hospital after you are discharged, you can call the unit and asked to speak with the hospitalist on call if the hospitalist that took care of you is not available. Once you are discharged, your primary care physician will handle any further medical issues. Please note that NO REFILLS for any discharge medications will be authorized once you are discharged, as it is imperative that you return to your primary care physician (or establish a relationship with a primary care physician if you do not have one) for your aftercare needs so that they can reassess your need for medications and monitor your lab values.  DATA REVIEW:   CBC  Recent Labs  Lab 02/08/20 0700  WBC 5.8  HGB 10.6*  HCT 32.3*  PLT 125*    Chemistries  Recent Labs  Lab 02/04/20 1016 02/04/20 1147 02/10/20 0535  NA 131*   < > 137  K 4.6   < > 4.1  CL 92*   < > 100  CO2 13*   < > 28  GLUCOSE 72   < > 97  BUN  10   < > 10  CREATININE 0.75   < > 0.75  CALCIUM 9.2   < > 9.0  MG  --    < > 2.1  AST 99*  --   --   ALT 34  --   --   ALKPHOS 120  --   --   BILITOT 1.7*  --   --    < > = values in this interval not displayed.    Microbiology Results   Recent Results (from the past 240 hour(s))  Respiratory Panel by RT PCR (Flu A&B, Covid) - Nasopharyngeal Swab     Status: None   Collection Time: 02/04/20 10:08 PM   Specimen: Nasopharyngeal Swab  Result Value Ref Range Status   SARS Coronavirus 2 by RT PCR NEGATIVE NEGATIVE Final    Comment: (NOTE) SARS-CoV-2 target nucleic acids are NOT DETECTED.  The SARS-CoV-2 RNA is generally detectable in upper respiratoy specimens during the acute phase of infection. The lowest concentration of SARS-CoV-2 viral copies this assay can detect is 131 copies/mL. A negative result does not preclude  SARS-Cov-2 infection and should not be used as the sole basis for treatment or other patient management decisions. A negative result may occur with  improper specimen collection/handling, submission of specimen other than nasopharyngeal swab, presence of viral mutation(s) within the areas targeted by this assay, and inadequate number of viral copies (<131 copies/mL). A negative result must be combined with clinical observations, patient history, and epidemiological information. The expected result is Negative.  Fact Sheet for Patients:  https://www.moore.com/https://www.fda.gov/media/142436/download  Fact Sheet for Healthcare Providers:  https://www.young.biz/https://www.fda.gov/media/142435/download  This test is no t yet approved or cleared by the [place] FDA and  has been authorized for detection and/or diagnosis of SARS-CoV-2 by FDA under an Emergency Use Authorization (EUA). This EUA will remain  in effect (meaning this test can be used) for the duration of the COVID-19 declaration under Section 564(b)(1) of the Act, 21 U.S.C. section 360bbb-3(b)(1), unless the authorization is terminated or revoked sooner.     Influenza A by PCR NEGATIVE NEGATIVE Final   Influenza B by PCR NEGATIVE NEGATIVE Final    Comment: (NOTE) The Xpert Xpress SARS-CoV-2/FLU/RSV assay is intended as an aid in  the diagnosis of influenza from Nasopharyngeal swab specimens and  should not be used as a sole basis for treatment. Nasal washings and  aspirates are unacceptable for Xpert Xpress SARS-CoV-2/FLU/RSV  testing.  Fact Sheet for Patients: https://www.moore.com/https://www.fda.gov/media/142436/download  Fact Sheet for Healthcare Providers: https://www.young.biz/https://www.fda.gov/media/142435/download  This test is not yet approved or cleared by the [place] FDA and  has been authorized for detection and/or diagnosis of SARS-CoV-2 by  FDA under an Emergency Use Authorization (EUA). This EUA will remain  in effect (meaning this test can be used) for the duration of the   Covid-19 declaration under Section 564(b)(1) of the Act, 21  U.S.C. section 360bbb-3(b)(1), unless the authorization is  terminated or revoked. Performed at [facility], [address] [place], [place] [address]     RADIOLOGY:  No results found.   CODE STATUS:     Code Status Orders  (From admission, onward)         Start     Ordered   02/04/20 1815  Full code  Continuous        02/04/20 1814        Code Status History    Date Active Date Inactive Code Status Order ID Comments User Context  01/14/2020 0247 01/19/2020 2357 Full Code [id number]  Hannah Beat, MD ED   01/14/2020 0144 01/14/2020 0247 Full Code [id number]  Nita Sickle, MD ED   12/31/2019 1631 01/06/2020 2346 Full Code [id number]  Gertha Calkin, MD ED   12/09/2019 1729 12/12/2019 1712 Full Code [id number]  Alford Highland, MD ED   02/20/2019 0944 02/20/2019 1734 Full Code [id number]  Chesley Noon, MD ED   01/23/2019 1648 01/25/2019 1957 Full Code [id number]  Mayo, Allyn Kenner, MD Inpatient   05/17/2018 2327 05/18/2018 1902 Full Code [id number]  Oralia Manis, MD Inpatient   05/17/2018 1950 05/17/2018 2327 Full Code [id number]  Nita Sickle, MD ED   12/24/2017 0803 12/25/2017 1645 Full Code [id number]  Arnaldo Natal, MD ED   06/07/2015 1619 06/08/2015 1754 Full Code [id number]  Shirlean Kelly, MD Inpatient   Advance Care Planning Activity       TOTAL TIME TAKING CARE OF THIS PATIENT: *40* minutes.    Enedina Finner M.D  [facility]    CC: Primary care physician; Rayetta Humphrey, MD

## 2020-02-10 NOTE — Progress Notes (Signed)
Physical Therapy Treatment Patient Details Name: Chad [name] MRN: [id number] DOB: 11/19/1950 Today's Date: 02/10/2020    History of Present Illness Chad [name] is a 69yo male with PMHx significant for lumbar degenerative disc disease, GERD, HTN, anxiety, depression, pneumonia, alcohol use disorder, tobacco use disorder, gallston cholecystitis requiring lap cholecystectomy, L hip fx requiring surgical intervention Sept 2021. He presented to hospital with generalized weakness, vomiting, poor oral intake, dehydration, RUQ pain, and mid abdominal pain. He is admitted for alcoholic ketoacidosis, dehydration, alcohol withdrawal syndrome, and generalized weakness.    PT Comments    Pt in good spirits today ready to walk.  Stated he was able to walk unit with nursing yesterday.  OOB with rail and min a x 1 for upper body assist.  "I have no core strength" as reported by pt.  Steady in sitting.  Stood with min a x 1.  He is able to walk 150' x 1 in hallway with min guard/a x 1.  Generally steady but he does take several self initiated rest breaks to shake out his arms due to fatigue and reports some L hip pain.    Pt initially recommended SNF.  Per MD, pt is refusing SNF and has a friend to help at home.  Pt would benefit from +1 assist with mobility for general safety if he chooses to return home.   Follow Up Recommendations  SNF;Other (comment)     Equipment Recommendations  Rolling walker with 5" wheels    Recommendations for Other Services       Precautions / Restrictions Precautions Precautions: Fall Restrictions Weight Bearing Restrictions: Yes Other Position/Activity Restrictions: L LE WBAT    Mobility  Bed Mobility Overal bed mobility: Needs Assistance Bed Mobility: Supine to Sit     Supine to sit: Min assist        Transfers   Equipment used: Rolling walker (2 wheeled) Transfers: Sit to/from Stand Sit to Stand: Min assist             Ambulation/Gait Ambulation/Gait assistance: Min guard Gait Distance (Feet): 150 Feet Assistive device: Rolling walker (2 wheeled) Gait Pattern/deviations: Step-through pattern;Decreased step length - right;Decreased step length - left Gait velocity: decreased   General Gait Details: stops frequently to shake out arms due to reported tricep fatigue   Stairs             Wheelchair Mobility    Modified Rankin (Stroke Patients Only)       Balance Overall balance assessment: Needs assistance Sitting-balance support: Feet supported Sitting balance-Leahy Scale: Good     Standing balance support: Bilateral upper extremity supported;No upper extremity supported Standing balance-Leahy Scale: Good                              Cognition Arousal/Alertness: Awake/alert Behavior During Therapy: WFL for tasks assessed/performed Overall Cognitive Status: History of cognitive impairments - at baseline                                        Exercises      General Comments        Pertinent Vitals/Pain Pain Assessment: Faces Faces Pain Scale: Hurts a little bit Pain Location: L hip Pain Descriptors / Indicators: Aching;Sore Pain Intervention(s): Limited activity within patient's tolerance;Monitored during session;Repositioned    Home Living  Prior Function            PT Goals (current goals can now be found in the care plan section) Progress towards PT goals: Progressing toward goals    Frequency    Min 2X/week      PT Plan Current plan remains appropriate;Other (comment)    Co-evaluation              AM-PAC PT "6 Clicks" Mobility   Outcome Measure  Help needed turning from your back to your side while in a flat bed without using bedrails?: A Little Help needed moving from lying on your back to sitting on the side of a flat bed without using bedrails?: A Little Help needed moving to and  from a bed to a chair (including a wheelchair)?: A Little Help needed standing up from a chair using your arms (e.g., wheelchair or bedside chair)?: A Little Help needed to walk in hospital room?: A Little Help needed climbing 3-5 steps with a railing? : A Lot 6 Click Score: 17    End of Session Equipment Utilized During Treatment: Gait belt Activity Tolerance: Patient tolerated treatment well Patient left: in chair;with call bell/phone within reach;with chair alarm set Nurse Communication: Mobility status Pain - Right/Left: Left Pain - part of body: Hip     Time: 1610-9604 PT Time Calculation (min) (ACUTE ONLY): 13 min  Charges:  $Gait Training: 8-22 mins                    [name], PTA 02/10/20, 12:49 PM

## 2020-02-10 NOTE — Progress Notes (Signed)
Chad [name] to be D/C'd home with friend per MD order.  Discussed prescriptions and follow up appointments with the patient. Prescriptions given to patient, medication list explained in detail. Pt verbalized understanding.  Allergies as of 02/10/2020       Reactions   Morphine And Related Other (See Comments)   Causes "bad disposition"   Ibuprofen Other (See Comments)   Gi upset        Medication List     STOP taking these medications    amLODipine 5 MG tablet Commonly known as: NORVASC   escitalopram 20 MG tablet Commonly known as: LEXAPRO       TAKE these medications    acetaminophen 325 MG tablet Commonly known as: TYLENOL Take 2 tablets (650 mg total) by mouth every 6 (six) hours as needed for mild pain (pain score 1-3 or temp > 100.5).   feeding supplement Liqd Take 237 mLs by mouth 2 (two) times daily between meals.   folic acid 1 MG tablet Commonly known as: FOLVITE Take 1 tablet (1 mg total) by mouth daily.   HYDROcodone-acetaminophen 7.5-325 MG tablet Commonly known as: NORCO Take 1 tablet by mouth every 6 (six) hours as needed for moderate pain.   hydrOXYzine 25 MG capsule Commonly known as: Vistaril Take 1 capsule (25 mg total) by mouth 3 (three) times daily as needed for anxiety or itching.   nicotine 21 mg/24hr patch Commonly known as: NICODERM CQ - dosed in mg/24 hours One 21mg  patch chest wall daily (okay to substitute generic)   pantoprazole 40 MG tablet Commonly known as: Protonix Take 1 tablet (40 mg total) by mouth 2 (two) times daily.   sertraline 50 MG tablet Commonly known as: ZOLOFT Take 3 tablets (150 mg total) by mouth daily. Start taking on: February 11, 2020   thiamine 100 MG tablet Take 1 tablet (100 mg total) by mouth daily.   traZODone 100 MG tablet Commonly known as: DESYREL Take 1 tablet (100 mg total) by mouth at bedtime.               Discharge Care Instructions  (From admission, onward)            Start     Ordered   02/10/20 0000  Discharge wound care:       Comments: none   02/10/20 1445            Vitals:   02/10/20 0339 02/10/20 1149  BP: 113/65 96/64  Pulse: 66 86  Resp: 18 20  Temp: (!) 97.5 F (36.4 C) 97.7 F (36.5 C)  SpO2: 97% 98%    Skin clean, dry and intact except for staples in Right hip from previous surgery. IV catheter discontinued intact. Site without signs and symptoms of complications. Dressing and pressure applied. Pt denies pain at this time. No complaints noted.  An After Visit Summary was printed and given to the patient. Patient escorted via WC, and D/C home via private auto.  Chad [name] A Chad [name]

## 2020-02-10 NOTE — Progress Notes (Signed)
[facility]  - [place] at [facility]   PATIENT NAME: Chad [name]    MR#:  [id number]  DATE OF BIRTH:  1950/05/22  SUBJECTIVE:   Patient more awake alert having meaningful conversation.  Patient reports Zoloft is working much better. His mood is stable. He is improving from physical therapy standpoint.  REVIEW OF SYSTEMS:   Review of Systems  Constitutional: Negative for fever and weight loss.  HENT: Negative for ear discharge, ear pain and nosebleeds.   Eyes: Negative for blurred vision, pain and discharge.  Respiratory: Negative for sputum production, shortness of breath, wheezing and stridor.   Cardiovascular: Negative for chest pain, palpitations, orthopnea and PND.  Gastrointestinal: Negative for abdominal pain, diarrhea, nausea and vomiting.  Genitourinary: Negative for frequency and urgency.  Musculoskeletal: Negative for back pain and joint pain.  Neurological: Positive for weakness. Negative for sensory change, speech change and focal weakness.  Psychiatric/Behavioral: Positive for depression. Negative for hallucinations. The patient is not nervous/anxious.    Tolerating Diet:yes Tolerating PT: SNF  DRUG ALLERGIES:   Allergies  Allergen Reactions  . Morphine And Related Other (See Comments)    Causes "bad disposition"  . Ibuprofen Other (See Comments)    Gi upset    VITALS:  Blood pressure 113/65, pulse 66, temperature (!) 97.5 F (36.4 C), resp. rate 18, height 5\' 8"  (1.727 m), weight 66.2 kg, SpO2 97 %.  PHYSICAL EXAMINATION:   Physical Exam  GENERAL:  69 y.o.-year-old patient lying in the bed with no acute distress. weak EYES: Pupils equal, round, reactive to light and accommodation. No scleral icterus.   HEENT: Head atraumatic, normocephalic. Oropharynx and nasopharynx clear.  NECK:  Supple, no jugular venous distention. No thyroid enlargement, no tenderness.  LUNGS: Normal breath sounds bilaterally, no wheezing, rales, rhonchi. No use  of accessory muscles of respiration.  CARDIOVASCULAR: S1, S2 normal. No murmurs, rubs, or gallops.  ABDOMEN: Soft, nontender, nondistended. Bowel sounds present. No organomegaly or mass.  EXTREMITIES: No cyanosis, clubbing or edema b/l.    NEUROLOGIC: Cranial nerves II through XII are intact. No focal Motor or sensory deficits b/l.   PSYCHIATRIC:  patient is alert and oriented x 2. Mood and affect flat SKIN: No obvious rash, lesion, or ulcer.   LABORATORY PANEL:  CBC Recent Labs  Lab 02/08/20 0700  WBC 5.8  HGB 10.6*  HCT 32.3*  PLT 125*    Chemistries  Recent Labs  Lab 02/04/20 1016 02/04/20 1147 02/10/20 0535  NA 131*   < > 137  K 4.6   < > 4.1  CL 92*   < > 100  CO2 13*   < > 28  GLUCOSE 72   < > 97  BUN 10   < > 10  CREATININE 0.75   < > 0.75  CALCIUM 9.2   < > 9.0  MG  --    < > 2.1  AST 99*  --   --   ALT 34  --   --   ALKPHOS 120  --   --   BILITOT 1.7*  --   --    < > = values in this interval not displayed.   Cardiac Enzymes No results for input(s): TROPONINI in the last 168 hours. RADIOLOGY:  No results found. ASSESSMENT AND PLAN:  Chad [name] is a 69 y.o. male with medical history significant for lumbar degenerative disc disease, GERD, hypertension, anxiety, depression, history of pneumonia, alcohol use disorder, tobacco use disorder,history of  gallstone cholecystitis requiring lap cholecystectomy, recent left hip fracture requiringsurgical intervention on 01/14/2020. He presented to hospital because of generalized weakness, vomiting, poor oral intake, dehydration, right upper quadrant abdominal pain.  Alcoholic ketoacidosis/lactic acidosis/ poor oral intake/dehydration:  -Acidosis has resolved and IV fluids has been discontinued. -- Tolerating PO diet -- overall improved.  Alcohol withdrawal syndrome with toxic encephalopathy:  -Continue Librium -Scoring pretty low on ciwa-- discontinue Ativan  Hypokalemia and hypophosphatemia: -Pharmacy to  replace electrolytes-- repleted  Depression  -noncompliance to Lexapro at home --No suicidal ideation. DC sitter -Dr. Toni Amend psychiatry consultation-- appreciated. Patient started on Zoloft hundred milligrams daily-- to be helping him.  Generalized weakness: PT and OT recommended discharge to SNF -Per TOC patient has signed out himself from rehab places in the past. So far no bed offers have been made. --TOC for discharge planning-- TOC patient has two bed offers.   Hypertension: -patient does not take amlodipine at home.  -Blood pressure soft. Hold antihypertensives  Recent cholecystectomy and right hip surgery in September 2021   Procedures:none Family communication : none  consults : psychiatry CODE STATUS: full DVT Prophylaxis : Lovenox  Status is: Inpatient  Remains inpatient appropriate because:Unsafe d/c plan patient is ready for discharge to rehab. Per TOC patient has to bed offers. Once patient chooses rehab place and insurance authorization approved  Dispo: The patient is from: Home              Anticipated d/c is to: To be determined              Anticipated d/c date is: once insurance authorization approved         patient currently best at baseline for discharge. Making good improving with physical therapy       TOTAL TIME TAKING CARE OF THIS PATIENT: 20 minutes.  >50% time spent on counselling and coordination of care  Note: This dictation was prepared with Dragon dictation along with smaller phrase technology. Any transcriptional errors that result from this process are unintentional.  Enedina Finner M.D    [facility]   CC: Primary care physician; Rayetta Humphrey, MDPatient ID: Chad [name], male   DOB: 02/28/1951, 69 y.o.   MRN: [id number]

## 2020-02-10 NOTE — Progress Notes (Signed)
Mobility Specialist - Progress Note   02/10/20 1300  Mobility  Activity Ambulated in hall  Level of Assistance Minimal assist, patient does 75% or more  Assistive Device Front wheel walker  Distance Ambulated (ft) 100 ft  Mobility Response Tolerated well  Mobility performed by Mobility specialist  $Mobility charge 1 Mobility    Pre-mobility: 77 HR, 98% SpO2 Post-mobility: 100 HR, 99% SpO2   Pt was sitting in recliner upon arrival. Pt agreed to session. Pt c/o L hip pain "8/10" and weakness in triceps, but was motivated to continue session. Pt was minA in STS transfer to RW. Pt ambulated 100' total in hallway and room with no LOB. Pt denied SOB. Several short breaks were taken for pt to shake out hands d/t weakness. Pt able to demonstrate WBAT on LLE with no VC needed for gait correction. Pt is steady during ambulation but minA needed for safety. Overall, pt tolerated session well. Pt was left in chair with all needs in reach and alarm set. Pt is requesting pain medication at this time. Nurse was notified.    Filiberto Pinks Mobility Specialist 02/10/20, 1:46 PM

## 2020-02-10 NOTE — TOC Transition Note (Signed)
Transition of Care [facility]) - CM/SW Discharge Note   Patient Details  Name: Chad [name] MRN: [id number] Date of Birth: November 02, 1950  Transition of Care [facility]) CM/SW Contact:  Margarito Liner, LCSW Phone Number: 02/10/2020, 2:50 PM   Clinical Narrative:   Patient has orders to discharge home today. No further concerns. CSW signing off.  Final next level of care: Home w Home Health Services Barriers to Discharge: Barriers Resolved   Patient Goals and CMS Choice   CMS Medicare.gov Compare Post Acute Care list provided to:: Patient Choice offered to / list presented to : Patient  Discharge Placement                Patient to be transferred to facility by: Friend will take him home.   Patient and family notified of of transfer: 02/10/20  Discharge Plan and Services                          HH Arranged: PT, OT HH Agency: [facility] Date HH Agency Contacted: 02/10/20   Representative spoke with at [facility] Agency: Mellissa Kohut  Social Determinants of Health (SDOH) Interventions     Readmission Risk Interventions No flowsheet data found.

## 2020-02-10 NOTE — Progress Notes (Signed)
PHARMACY CONSULT NOTE  Pharmacy Consult for Electrolyte Monitoring and Replacement   Recent Labs: Potassium (mmol/L)  Date Value  02/10/2020 4.1   Magnesium (mg/dL)  Date Value  67/61/9509 2.1   Calcium (mg/dL)  Date Value  32/67/1245 9.0   Albumin (g/dL)  Date Value  80/99/8338 2.9 (L)   Phosphorus (mg/dL)  Date Value  25/08/3974 4.1   Sodium (mmol/L)  Date Value  02/10/2020 137    Assessment: 69 y.o. male with medical history significant for lumbar degenerative disc disease, GERD, hypertension, anxiety, depression, history of pneumonia, alcohol use disorder, tobacco use disorder,history of gallstone cholecystitis requiring lap cholecystectomy, admitted to the hospital for alcoholic ketoacidosis, dehydration, alcohol withdrawal syndrome and generalized weakness.   Goal of Therapy:  Electrolytes WNL  Plan:   No electrolyte replacement warranted today  He has been stable 2 days in a row: Re-check electrolytes in 10/24 am   Lowella Bandy ,PharmD Clinical Pharmacist 02/10/2020 7:20 AM

## 2020-02-10 NOTE — Care Management Important Message (Signed)
Important Message  Patient Details  Name: Chad [name] MRN: [id number] Date of Birth: [birth year]   Medicare Important Message Given:  Yes     [name] 02/10/2020, 12:13 PM

## 2020-02-10 NOTE — Progress Notes (Signed)
Occupational Therapy Treatment Patient Details Name: Chad [name] MRN: [id number] DOB: June 08, 1950 Today's Date: 02/10/2020    History of present illness Chad [name] is a 69yo male with PMHx significant for lumbar degenerative disc disease, GERD, HTN, anxiety, depression, pneumonia, alcohol use disorder, tobacco use disorder, gallston cholecystitis requiring lap cholecystectomy, L hip fx requiring surgical intervention Sept 2021. He presented to hospital with generalized weakness, vomiting, poor oral intake, dehydration, RUQ pain, and mid abdominal pain. He is admitted for alcoholic ketoacidosis, dehydration, alcohol withdrawal syndrome, and generalized weakness.   OT comments  Chad [name] continues to present as debilitated and weak but is much improved since evaluation 3 days earlier. Pt is able to transfer sit<stand with CGA, is oriented to person, place, and time, and expresses eagerness to go home. Chad [name] states that a recent change in SSRI Rx seems to have made a major difference, and that he now feels much calmer and is no longer "agitated" and does not "wake up angry and stay angry all the time." Discussed importance of adherence to medication schedule and to staying sober. Chad [name] says he is aware of several AA meetings near him and that he intends to participate at least 1x/week. Pt has declined SNF placement and will return home. Recommend outpatient OT to continue addressing balance, endurance, and strength concerns.    Follow Up Recommendations  Outpatient OT    Equipment Recommendations  Tub/shower seat    Recommendations for Other Services      Precautions / Restrictions Precautions Precautions: Fall Restrictions Weight Bearing Restrictions: Yes Other Position/Activity Restrictions: L LE WBAT       Mobility Bed Mobility Overal bed mobility: Needs Assistance Bed Mobility: Supine to Sit     Supine to sit: Min assist        Transfers Overall transfer  level: Needs assistance Equipment used: Rolling walker (2 wheeled) Transfers: Sit to/from Stand Sit to Stand: Min guard         General transfer comment: Requires VCs, repeated instructions, but no physical assist    Balance Overall balance assessment: Needs assistance Sitting-balance support: Feet supported Sitting balance-Leahy Scale: Good     Standing balance support: Bilateral upper extremity supported;No upper extremity supported Standing balance-Leahy Scale: Good Standing balance comment: Can maintain balance w/o UE support on RW                           ADL either performed or assessed with clinical judgement   ADL Overall ADL's : Needs assistance/impaired     Grooming: Oral care;Set up;Supervision/safety                               Functional mobility during ADLs: Min guard       Vision Baseline Vision/History: Wears glasses Wears Glasses: At all times Patient Visual Report: No change from baseline Additional Comments: glasses broken; temple repaired with a tongue depressor   Perception     Praxis      Cognition Arousal/Alertness: Awake/alert Behavior During Therapy: WFL for tasks assessed/performed Overall Cognitive Status: History of cognitive impairments - at baseline Area of Impairment: Safety/judgement;Awareness;Orientation;Attention;Problem solving;Following commands                   Current Attention Level: Divided   Following Commands: Follows multi-step commands with increased time       General Comments: Pt much more engaged and coherent  this session.        Exercises Other Exercises Other Exercises: Provided educ re: medication managment, home safety, importance of staying sober, seeking support (e.g., AA), falls prevention   Shoulder Instructions       General Comments Pt states he feeling much better and think the latest medication adjustment is making a big difference    Pertinent Vitals/  Pain       Pain Assessment: No/denies pain Faces Pain Scale: Hurts a little bit Pain Location: L hip Pain Descriptors / Indicators: Aching;Sore Pain Intervention(s): Limited activity within patient's tolerance;Monitored during session;Repositioned  Home Living                                          Prior Functioning/Environment              Frequency  Min 1X/week        Progress Toward Goals  OT Goals(current goals can now be found in the care plan section)  Progress towards OT goals: Progressing toward goals  Acute Rehab OT Goals Patient Stated Goal: "to go home" OT Goal Formulation: With patient Time For Goal Achievement: 02/20/20 Potential to Achieve Goals: Good  Plan Frequency remains appropriate;Discharge plan needs to be updated    Co-evaluation                 AM-PAC OT "6 Clicks" Daily Activity     Outcome Measure   Help from another person eating meals?: None Help from another person taking care of personal grooming?: A Little Help from another person toileting, which includes using toliet, bedpan, or urinal?: A Little Help from another person bathing (including washing, rinsing, drying)?: A Little Help from another person to put on and taking off regular upper body clothing?: None Help from another person to put on and taking off regular lower body clothing?: A Little 6 Click Score: 20    End of Session Equipment Utilized During Treatment: Rolling walker  OT Visit Diagnosis: Other abnormalities of gait and mobility (R26.89);Muscle weakness (generalized) (M62.81);History of falling (Z91.81);Pain;Repeated falls (R29.6) Pain - Right/Left: Left Pain - part of body: Hip   Activity Tolerance Patient tolerated treatment well   Patient Left in chair;with call bell/phone within reach;with family/visitor present   Nurse Communication Other (comment) (Discharge plans)        Time: 1324-4010 OT Time Calculation (min): 11  min  Charges: OT General Charges $OT Visit: 1 Visit OT Treatments $Self Care/Home Management : 8-22 mins   Latina Craver, PhD, MS, OTR/L ascom [phone] 02/10/20, 3:12 PM

## 2020-02-10 NOTE — Discharge Instructions (Signed)
Abstain from drinking alcohol °

## 2020-02-23 ENCOUNTER — Ambulatory Visit: Payer: Medicare HMO | Admitting: Gastroenterology

## 2020-05-01 ENCOUNTER — Inpatient Hospital Stay
Admission: EM | Admit: 2020-05-01 | Discharge: 2020-05-03 | DRG: 378 | Disposition: A | Discharge status: Home Health | Payer: Medicare HMO | Attending: Internal Medicine | Admitting: Internal Medicine

## 2020-05-01 ENCOUNTER — Other Ambulatory Visit: Payer: Self-pay

## 2020-05-01 ENCOUNTER — Emergency Department: Payer: Medicare HMO

## 2020-05-01 DIAGNOSIS — K921 Melena: Secondary | ICD-10-CM | POA: Diagnosis present

## 2020-05-01 DIAGNOSIS — E861 Hypovolemia: Secondary | ICD-10-CM | POA: Diagnosis present

## 2020-05-01 DIAGNOSIS — K922 Gastrointestinal hemorrhage, unspecified: Secondary | ICD-10-CM | POA: Diagnosis present

## 2020-05-01 DIAGNOSIS — Q399 Congenital malformation of esophagus, unspecified: Secondary | ICD-10-CM

## 2020-05-01 DIAGNOSIS — Z87448 Personal history of other diseases of urinary system: Secondary | ICD-10-CM

## 2020-05-01 DIAGNOSIS — Z885 Allergy status to narcotic agent status: Secondary | ICD-10-CM

## 2020-05-01 DIAGNOSIS — Z9049 Acquired absence of other specified parts of digestive tract: Secondary | ICD-10-CM

## 2020-05-01 DIAGNOSIS — F418 Other specified anxiety disorders: Secondary | ICD-10-CM | POA: Diagnosis present

## 2020-05-01 DIAGNOSIS — K5730 Diverticulosis of large intestine without perforation or abscess without bleeding: Secondary | ICD-10-CM | POA: Diagnosis present

## 2020-05-01 DIAGNOSIS — Z886 Allergy status to analgesic agent status: Secondary | ICD-10-CM

## 2020-05-01 DIAGNOSIS — F17210 Nicotine dependence, cigarettes, uncomplicated: Secondary | ICD-10-CM | POA: Diagnosis present

## 2020-05-01 DIAGNOSIS — F10230 Alcohol dependence with withdrawal, uncomplicated: Secondary | ICD-10-CM | POA: Diagnosis present

## 2020-05-01 DIAGNOSIS — K920 Hematemesis: Principal | ICD-10-CM | POA: Diagnosis present

## 2020-05-01 DIAGNOSIS — K219 Gastro-esophageal reflux disease without esophagitis: Secondary | ICD-10-CM | POA: Diagnosis present

## 2020-05-01 DIAGNOSIS — F32A Depression, unspecified: Secondary | ICD-10-CM

## 2020-05-01 DIAGNOSIS — E871 Hypo-osmolality and hyponatremia: Secondary | ICD-10-CM | POA: Diagnosis present

## 2020-05-01 DIAGNOSIS — F419 Anxiety disorder, unspecified: Secondary | ICD-10-CM

## 2020-05-01 DIAGNOSIS — F1010 Alcohol abuse, uncomplicated: Secondary | ICD-10-CM

## 2020-05-01 DIAGNOSIS — Z79899 Other long term (current) drug therapy: Secondary | ICD-10-CM

## 2020-05-01 DIAGNOSIS — K449 Diaphragmatic hernia without obstruction or gangrene: Secondary | ICD-10-CM | POA: Diagnosis present

## 2020-05-01 DIAGNOSIS — I10 Essential (primary) hypertension: Secondary | ICD-10-CM | POA: Diagnosis present

## 2020-05-01 DIAGNOSIS — R109 Unspecified abdominal pain: Secondary | ICD-10-CM

## 2020-05-01 DIAGNOSIS — Z20822 Contact with and (suspected) exposure to covid-19: Secondary | ICD-10-CM | POA: Diagnosis present

## 2020-05-01 LAB — COMPREHENSIVE METABOLIC PANEL
ALT: 43 U/L (ref 0–44)
AST: 92 U/L — ABNORMAL HIGH (ref 15–41)
Albumin: 4 g/dL (ref 3.5–5.0)
Alkaline Phosphatase: 138 U/L — ABNORMAL HIGH (ref 38–126)
Anion gap: 14 (ref 5–15)
BUN: 6 mg/dL — ABNORMAL LOW (ref 8–23)
CO2: 22 mmol/L (ref 22–32)
Calcium: 9.1 mg/dL (ref 8.9–10.3)
Chloride: 96 mmol/L — ABNORMAL LOW (ref 98–111)
Creatinine, Ser: 0.56 mg/dL — ABNORMAL LOW (ref 0.61–1.24)
GFR, Estimated: >60 mL/min (ref >60)
Glucose, Bld: 115 mg/dL — ABNORMAL HIGH (ref 70–99)
Potassium: 4.1 mmol/L (ref 3.5–5.1)
Sodium: 132 mmol/L — ABNORMAL LOW (ref 135–145)
Total Bilirubin: 0.8 mg/dL (ref 0.3–1.2)
Total Protein: 7.7 g/dL (ref 6.5–8.1)

## 2020-05-01 LAB — CBC
HCT: 46.7 % (ref 39.0–52.0)
Hemoglobin: 15.5 g/dL (ref 13.0–17.0)
MCH: 32 pg (ref 26.0–34.0)
MCHC: 33.2 g/dL (ref 30.0–36.0)
MCV: 96.3 fL (ref 80.0–100.0)
Platelets: 369 10*3/uL (ref 150–400)
RBC: 4.85 MIL/uL (ref 4.22–5.81)
RDW: 13.2 % (ref 11.5–15.5)
WBC: 7.9 10*3/uL (ref 4.0–10.5)
nRBC: 0 % (ref 0.0–0.2)

## 2020-05-01 LAB — TYPE AND SCREEN
ABO/RH(D): A POS
Antibody Screen: NEGATIVE

## 2020-05-01 LAB — LIPASE, BLOOD: Lipase: 27 U/L (ref 11–51)

## 2020-05-01 MED ORDER — LORAZEPAM 2 MG/ML IJ SOLN: 0.0000 mg | Freq: Two times a day (BID) | INTRAMUSCULAR | Status: DC

## 2020-05-01 MED ORDER — THIAMINE HCL 100 MG PO TABS: 100.0000 mg | ORAL_TABLET | Freq: Every day | ORAL | Status: DC

## 2020-05-01 MED ORDER — LORAZEPAM 2 MG PO TABS: 0.0000 mg | ORAL_TABLET | Freq: Four times a day (QID) | ORAL | Status: DC

## 2020-05-01 MED ORDER — THIAMINE HCL 100 MG/ML IJ SOLN: 100.0000 mg | Freq: Every day | INTRAMUSCULAR | Status: DC

## 2020-05-01 MED ORDER — LORAZEPAM 2 MG PO TABS: 0.0000 mg | ORAL_TABLET | Freq: Two times a day (BID) | ORAL | Status: DC

## 2020-05-01 MED ORDER — IOHEXOL 350 MG/ML SOLN
100.0000 mL | Freq: Once | INTRAVENOUS | Status: AC | PRN
  Administered 2020-05-01: 100 mL via INTRAVENOUS

## 2020-05-01 MED ORDER — LORAZEPAM 2 MG/ML IJ SOLN
0.0000 mg | Freq: Four times a day (QID) | INTRAMUSCULAR | Status: DC
  Administered 2020-05-01: 2 mg via INTRAVENOUS
  Filled 2020-05-01: qty 1

## 2020-05-01 NOTE — ED Triage Notes (Signed)
Pt comes via POV from home with c/o abdominal pain and heavy rectal bleeding. Pt states this started today. Pt denies any blood thinners.  Pt states some Nausea. Pt states bright red blood.

## 2020-05-01 NOTE — ED Provider Notes (Signed)
11:00 PM  Assumed care.  Patient is a 70 year old male with history of alcohol use who presents to the emergency department with hematemesis and dark stools for several days.  Brown stool on exam here that is guaiac positive.  Hemoglobin of 15.  Hemodynamically stable without any active signs of hemorrhage here.  CT abdomen pelvis pending.  Had endoscopy 3 months ago after similar symptoms with no source of bleeding found.  Had Barrett's esophagus at that time.  If unremarkable with no sign of GI bleed, plan to repeat H/H 6 hours after the first.  If this is stable, anticipate discharge home.  11:45 PM  Pt's CT scan does not localize any GI bleed.  He does have extensive diverticulosis without diverticulitis.  Currently hemodynamically stable but is showing signs of alcohol withdrawal.  Will place on CIWA protocol.  Will repeat H&H.  12:30 AM  Pt has had a 2 g drop in hemoglobin in less than 6 hours and has not received any IV fluids.  He reports he has continued to have bloody bowel movements and has had 1 in the ED.  No further hematemesis.  Reports his last colonoscopy was probably 15 years ago.  He may have a diverticular bleed although no source of bleeding seen on CT angio.  I feel he needs observation in the hospital given his drop in hemoglobin and continued GI bleeding.  Will discuss with hospitalist.  Patient continues to be hemodynamically stable.  He has received Ativan and is not tremulous at this time.  No hallucinations.  12:49 AM Discussed patient's case with hospitalist, Dr. Antionette Char.  I have recommended admission and patient (and family if present) agree with this plan. Admitting physician will place admission orders.   I reviewed all nursing notes, vitals, pertinent previous records and reviewed/interpreted all EKGs, lab and urine results, imaging (as available).     Anamaria Dusenbury, Layla Maw, DO 05/02/20 [phone]

## 2020-05-01 NOTE — ED Notes (Signed)
Resumed care from robin rn.  Pt alert.  Pt has tremors, drinks alcohol every day per pt.  Pt requesting meds for his shakes.  md aware.
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
## 2020-05-01 NOTE — ED Notes (Signed)
Patient transported to CT 

## 2020-05-01 NOTE — ED Notes (Signed)
mes given for the tremors.

## 2020-05-02 ENCOUNTER — Encounter: Payer: Self-pay | Admitting: Family Medicine

## 2020-05-02 DIAGNOSIS — E861 Hypovolemia: Secondary | ICD-10-CM | POA: Diagnosis present

## 2020-05-02 DIAGNOSIS — I10 Essential (primary) hypertension: Secondary | ICD-10-CM | POA: Diagnosis present

## 2020-05-02 DIAGNOSIS — Z87448 Personal history of other diseases of urinary system: Secondary | ICD-10-CM | POA: Diagnosis not present

## 2020-05-02 DIAGNOSIS — F10230 Alcohol dependence with withdrawal, uncomplicated: Secondary | ICD-10-CM

## 2020-05-02 DIAGNOSIS — E871 Hypo-osmolality and hyponatremia: Secondary | ICD-10-CM | POA: Diagnosis present

## 2020-05-02 DIAGNOSIS — K5730 Diverticulosis of large intestine without perforation or abscess without bleeding: Secondary | ICD-10-CM | POA: Diagnosis present

## 2020-05-02 DIAGNOSIS — Z20822 Contact with and (suspected) exposure to covid-19: Secondary | ICD-10-CM | POA: Diagnosis present

## 2020-05-02 DIAGNOSIS — K219 Gastro-esophageal reflux disease without esophagitis: Secondary | ICD-10-CM | POA: Diagnosis present

## 2020-05-02 DIAGNOSIS — F418 Other specified anxiety disorders: Secondary | ICD-10-CM | POA: Diagnosis not present

## 2020-05-02 DIAGNOSIS — Z79899 Other long term (current) drug therapy: Secondary | ICD-10-CM | POA: Diagnosis not present

## 2020-05-02 DIAGNOSIS — Z886 Allergy status to analgesic agent status: Secondary | ICD-10-CM | POA: Diagnosis not present

## 2020-05-02 DIAGNOSIS — K922 Gastrointestinal hemorrhage, unspecified: Secondary | ICD-10-CM | POA: Diagnosis not present

## 2020-05-02 DIAGNOSIS — K449 Diaphragmatic hernia without obstruction or gangrene: Secondary | ICD-10-CM | POA: Diagnosis present

## 2020-05-02 DIAGNOSIS — Z9049 Acquired absence of other specified parts of digestive tract: Secondary | ICD-10-CM | POA: Diagnosis not present

## 2020-05-02 DIAGNOSIS — Z885 Allergy status to narcotic agent status: Secondary | ICD-10-CM | POA: Diagnosis not present

## 2020-05-02 DIAGNOSIS — K920 Hematemesis: Secondary | ICD-10-CM | POA: Diagnosis present

## 2020-05-02 DIAGNOSIS — Q399 Congenital malformation of esophagus, unspecified: Secondary | ICD-10-CM | POA: Diagnosis not present

## 2020-05-02 DIAGNOSIS — K921 Melena: Secondary | ICD-10-CM | POA: Diagnosis present

## 2020-05-02 DIAGNOSIS — F17210 Nicotine dependence, cigarettes, uncomplicated: Secondary | ICD-10-CM | POA: Diagnosis present

## 2020-05-02 DIAGNOSIS — R109 Unspecified abdominal pain: Secondary | ICD-10-CM | POA: Diagnosis not present

## 2020-05-02 DIAGNOSIS — F1010 Alcohol abuse, uncomplicated: Secondary | ICD-10-CM | POA: Diagnosis not present

## 2020-05-02 LAB — HEMOGLOBIN
Hemoglobin: 13.2 g/dL (ref 13.0–17.0)
Hemoglobin: 13.5 g/dL (ref 13.0–17.0)

## 2020-05-02 LAB — COMPREHENSIVE METABOLIC PANEL
ALT: 44 U/L (ref 0–44)
AST: 109 U/L — ABNORMAL HIGH (ref 15–41)
Albumin: 3.4 g/dL — ABNORMAL LOW (ref 3.5–5.0)
Alkaline Phosphatase: 156 U/L — ABNORMAL HIGH (ref 38–126)
Anion gap: 8 (ref 5–15)
BUN: 6 mg/dL — ABNORMAL LOW (ref 8–23)
CO2: 26 mmol/L (ref 22–32)
Calcium: 8.5 mg/dL — ABNORMAL LOW (ref 8.9–10.3)
Chloride: 99 mmol/L (ref 98–111)
Creatinine, Ser: 0.64 mg/dL (ref 0.61–1.24)
GFR, Estimated: >60 mL/min (ref >60)
Glucose, Bld: 97 mg/dL (ref 70–99)
Potassium: 3.9 mmol/L (ref 3.5–5.1)
Sodium: 133 mmol/L — ABNORMAL LOW (ref 135–145)
Total Bilirubin: 0.7 mg/dL (ref 0.3–1.2)
Total Protein: 6.4 g/dL — ABNORMAL LOW (ref 6.5–8.1)

## 2020-05-02 LAB — HEMATOCRIT
HCT: 40.1 % (ref 39.0–52.0)
HCT: 39.3 % (ref 39.0–52.0)

## 2020-05-02 LAB — HEMOGLOBIN AND HEMATOCRIT, BLOOD
HCT: 40.5 % (ref 39.0–52.0)
Hemoglobin: 13.4 g/dL (ref 13.0–17.0)

## 2020-05-02 LAB — PHOSPHORUS: Phosphorus: 4.1 mg/dL (ref 2.5–4.6)

## 2020-05-02 LAB — SARS CORONAVIRUS 2 (TAT 6-24 HRS): SARS Coronavirus 2: NEGATIVE

## 2020-05-02 LAB — MAGNESIUM: Magnesium: 1.9 mg/dL (ref 1.7–2.4)

## 2020-05-02 MED ORDER — FOLIC ACID 1 MG PO TABS
1.0000 mg | ORAL_TABLET | Freq: Every day | ORAL | Status: DC
  Administered 2020-05-02: 1 mg via ORAL
  Filled 2020-05-02: qty 1

## 2020-05-02 MED ORDER — LORAZEPAM 2 MG/ML IJ SOLN: 0.0000 mg | Freq: Two times a day (BID) | INTRAMUSCULAR | Status: DC

## 2020-05-02 MED ORDER — DICYCLOMINE HCL 10 MG/ML IM SOLN
20.0000 mg | Freq: Once | INTRAMUSCULAR | Status: AC
  Administered 2020-05-02: 20 mg via INTRAMUSCULAR
  Filled 2020-05-02: qty 2

## 2020-05-02 MED ORDER — ONDANSETRON HCL 4 MG PO TABS: 4.0000 mg | ORAL_TABLET | Freq: Four times a day (QID) | ORAL | Status: DC | PRN

## 2020-05-02 MED ORDER — LORAZEPAM 1 MG PO TABS
1.0000 mg | ORAL_TABLET | ORAL | Status: DC | PRN
Start: 2020-05-02 — End: 2020-05-03
  Administered 2020-05-02 – 2020-05-03 (×2): 1 mg via ORAL
  Filled 2020-05-02 (×2): qty 1

## 2020-05-02 MED ORDER — ONDANSETRON HCL 4 MG/2ML IJ SOLN: 4.0000 mg | Freq: Four times a day (QID) | INTRAMUSCULAR | Status: DC | PRN

## 2020-05-02 MED ORDER — LORAZEPAM 2 MG/ML IJ SOLN
0.0000 mg | Freq: Four times a day (QID) | INTRAMUSCULAR | Status: DC
  Administered 2020-05-02 (×2): 1 mg via INTRAVENOUS
  Administered 2020-05-02: 2 mg via INTRAVENOUS
  Administered 2020-05-02: 1 mg via INTRAVENOUS
  Filled 2020-05-02 (×3): qty 1

## 2020-05-02 MED ORDER — THIAMINE HCL 100 MG PO TABS
100.0000 mg | ORAL_TABLET | Freq: Every day | ORAL | Status: DC
  Administered 2020-05-02: 100 mg via ORAL
  Filled 2020-05-02: qty 1

## 2020-05-02 MED ORDER — PANTOPRAZOLE SODIUM 40 MG IV SOLR
40.0000 mg | Freq: Once | INTRAVENOUS | Status: AC
  Administered 2020-05-02: 40 mg via INTRAVENOUS
  Filled 2020-05-02: qty 40

## 2020-05-02 MED ORDER — LORAZEPAM 2 MG/ML IJ SOLN
INTRAMUSCULAR | Status: AC
  Filled 2020-05-02: qty 1

## 2020-05-02 MED ORDER — NICOTINE 14 MG/24HR TD PT24
14.0000 mg | MEDICATED_PATCH | Freq: Every day | TRANSDERMAL | Status: DC
  Administered 2020-05-02 – 2020-05-03 (×2): 14 mg via TRANSDERMAL
  Filled 2020-05-02 (×3): qty 1

## 2020-05-02 MED ORDER — LORAZEPAM 2 MG/ML IJ SOLN
1.0000 mg | INTRAMUSCULAR | Status: DC | PRN
  Filled 2020-05-02: qty 1

## 2020-05-02 MED ORDER — THIAMINE HCL 100 MG/ML IJ SOLN
100.0000 mg | Freq: Every day | INTRAMUSCULAR | Status: DC
  Filled 2020-05-02: qty 2

## 2020-05-02 MED ORDER — SODIUM CHLORIDE 0.9 % IV SOLN: INTRAVENOUS | Status: AC

## 2020-05-02 MED ORDER — ADULT MULTIVITAMIN W/MINERALS CH
1.0000 | ORAL_TABLET | Freq: Every day | ORAL | Status: DC
  Administered 2020-05-02: 1 via ORAL
  Filled 2020-05-02: qty 1

## 2020-05-02 MED ORDER — PANTOPRAZOLE SODIUM 40 MG IV SOLR
40.0000 mg | Freq: Two times a day (BID) | INTRAVENOUS | Status: DC
  Administered 2020-05-02 – 2020-05-03 (×3): 40 mg via INTRAVENOUS
  Filled 2020-05-02 (×3): qty 40

## 2020-05-02 NOTE — Progress Notes (Signed)
No charge progress note.  Chad [name] is a 70 y.o. male with medical history significant for alcohol abuse, anxiety, and depression, presenting to the emergency department for evaluation of GI bleeding.  Patient reports several days of periumbilical abdominal pain that has been fairly constant, moderate in intensity, and without any alleviating or exacerbating factors identified.  He then went on to develop nausea with vomiting on 04/30/2020.  He reports bright red blood in in his vomit and also reports bright red blood per rectum that also developed the same day.  He denies any lightheadedness or chest pain.  Denies fevers or chills.  Patient uses Motrin on a daily basis for some ankle pain.  Hemoglobin and BUN/creatinine remained stable.  Did not had any more hematemesis or bowel movement since in the hospital.  GI was consulted and differential was NSAID induced gastropathy/alcohol induced.  He was started on clear liquid and plan is to do EGD tomorrow morning.  He was also placed on CIWA protocol-high risk for withdrawal. Continue with IV Protonix.
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
## 2020-05-02 NOTE — Plan of Care (Signed)
  Problem: Education: Goal: Knowledge of General Education information will improve Description: Including pain rating scale, medication(s)/side effects and non-pharmacologic comfort measures Outcome: Progressing   Problem: Health Behavior/Discharge Planning: Goal: Ability to manage health-related needs will improve Outcome: Progressing   Problem: Clinical Measurements: Goal: Ability to maintain clinical measurements within normal limits will improve Outcome: Progressing Goal: Will remain free from infection Outcome: Progressing Goal: Diagnostic test results will improve Outcome: Progressing Goal: Respiratory complications will improve Outcome: Progressing Goal: Cardiovascular complication will be avoided Outcome: Progressing   Problem: Activity: Goal: Risk for activity intolerance will decrease Outcome: Progressing   Problem: Nutrition: Goal: Adequate nutrition will be maintained Outcome: Progressing   Problem: Coping: Goal: Level of anxiety will decrease Outcome: Progressing   Problem: Elimination: Goal: Will not experience complications related to bowel motility Outcome: Progressing Goal: Will not experience complications related to urinary retention Outcome: Progressing   Problem: Pain Managment: Goal: General experience of comfort will improve Outcome: Progressing   Problem: Safety: Goal: Ability to remain free from injury will improve Outcome: Progressing   Problem: Skin Integrity: Goal: Risk for impaired skin integrity will decrease Outcome: Progressing   Problem: Education: Goal: Ability to identify signs and symptoms of gastrointestinal bleeding will improve Outcome: Progressing   Problem: Bowel/Gastric: Goal: Will show no signs and symptoms of gastrointestinal bleeding Outcome: Progressing   Problem: Fluid Volume: Goal: Will show no signs and symptoms of excessive bleeding Outcome: Progressing   Problem: Clinical Measurements: Goal:  Complications related to the disease process, condition or treatment will be avoided or minimized Outcome: Progressing   Problem: Education: Goal: Knowledge of disease or condition will improve Outcome: Progressing Goal: Understanding of discharge needs will improve Outcome: Progressing   Problem: Health Behavior/Discharge Planning: Goal: Ability to identify changes in lifestyle to reduce recurrence of condition will improve Outcome: Progressing Goal: Identification of resources available to assist in meeting health care needs will improve Outcome: Progressing   Problem: Physical Regulation: Goal: Complications related to the disease process, condition or treatment will be avoided or minimized Outcome: Progressing   Problem: Safety: Goal: Ability to remain free from injury will improve Outcome: Progressing
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
## 2020-05-02 NOTE — ED Notes (Signed)
Report messaged to julie rn floor nurse 

## 2020-05-02 NOTE — Clinical Social Work Note (Signed)
CSW acknowledges SA resource consult. Patient currently AOx2. Will follow for improvement in mentation before offering resources.  Charlynn Court, CSW [phone]

## 2020-05-03 ENCOUNTER — Encounter
Admission: EM | Disposition: A | Discharge status: Home Health | Payer: Self-pay | Source: Home / Self Care | Attending: Internal Medicine

## 2020-05-03 ENCOUNTER — Inpatient Hospital Stay: Payer: Medicare HMO | Admitting: Certified Registered Nurse Anesthetist

## 2020-05-03 ENCOUNTER — Encounter: Payer: Self-pay | Admitting: Internal Medicine

## 2020-05-03 DIAGNOSIS — F10230 Alcohol dependence with withdrawal, uncomplicated: Secondary | ICD-10-CM | POA: Diagnosis not present

## 2020-05-03 DIAGNOSIS — F1010 Alcohol abuse, uncomplicated: Secondary | ICD-10-CM

## 2020-05-03 DIAGNOSIS — R109 Unspecified abdominal pain: Secondary | ICD-10-CM | POA: Diagnosis not present

## 2020-05-03 DIAGNOSIS — K922 Gastrointestinal hemorrhage, unspecified: Secondary | ICD-10-CM | POA: Diagnosis not present

## 2020-05-03 HISTORY — PX: ESOPHAGOGASTRODUODENOSCOPY: SHX5428

## 2020-05-03 LAB — COMPREHENSIVE METABOLIC PANEL
ALT: 31 U/L (ref 0–44)
AST: 48 U/L — ABNORMAL HIGH (ref 15–41)
Albumin: 3.2 g/dL — ABNORMAL LOW (ref 3.5–5.0)
Alkaline Phosphatase: 149 U/L — ABNORMAL HIGH (ref 38–126)
Anion gap: 10 (ref 5–15)
BUN: <5 mg/dL — ABNORMAL LOW (ref 8–23)
CO2: 23 mmol/L (ref 22–32)
Calcium: 8.9 mg/dL (ref 8.9–10.3)
Chloride: 102 mmol/L (ref 98–111)
Creatinine, Ser: 0.62 mg/dL (ref 0.61–1.24)
GFR, Estimated: >60 mL/min (ref >60)
Glucose, Bld: 112 mg/dL — ABNORMAL HIGH (ref 70–99)
Potassium: 4 mmol/L (ref 3.5–5.1)
Sodium: 135 mmol/L (ref 135–145)
Total Bilirubin: 1.1 mg/dL (ref 0.3–1.2)
Total Protein: 6.1 g/dL — ABNORMAL LOW (ref 6.5–8.1)

## 2020-05-03 LAB — HEMOGLOBIN: Hemoglobin: 13.9 g/dL (ref 13.0–17.0)

## 2020-05-03 LAB — HEMATOCRIT: HCT: 41.8 % (ref 39.0–52.0)

## 2020-05-03 SURGERY — EGD (ESOPHAGOGASTRODUODENOSCOPY)
Anesthesia: General

## 2020-05-03 MED ORDER — FENTANYL CITRATE (PF) 100 MCG/2ML IJ SOLN
INTRAMUSCULAR | Status: AC
  Filled 2020-05-03: qty 2

## 2020-05-03 MED ORDER — PROPOFOL 500 MG/50ML IV EMUL
INTRAVENOUS | Status: DC | PRN
  Administered 2020-05-03: 150 ug/kg/min via INTRAVENOUS

## 2020-05-03 MED ORDER — PANTOPRAZOLE SODIUM 40 MG PO TBEC: 40.0000 mg | DELAYED_RELEASE_TABLET | Freq: Two times a day (BID) | ORAL | 0 refills | Status: DC

## 2020-05-03 MED ORDER — MIDAZOLAM HCL 2 MG/2ML IJ SOLN
INTRAMUSCULAR | Status: AC
  Filled 2020-05-03: qty 2

## 2020-05-03 MED ORDER — MIDAZOLAM HCL 2 MG/2ML IJ SOLN
INTRAMUSCULAR | Status: DC | PRN
  Administered 2020-05-03: 2 mg via INTRAVENOUS

## 2020-05-03 MED ORDER — PROPOFOL 500 MG/50ML IV EMUL
INTRAVENOUS | Status: AC
  Filled 2020-05-03: qty 50

## 2020-05-03 MED ORDER — PROPOFOL 10 MG/ML IV BOLUS
INTRAVENOUS | Status: DC | PRN
  Administered 2020-05-03: 50 mg via INTRAVENOUS

## 2020-05-03 MED ORDER — FENTANYL CITRATE (PF) 100 MCG/2ML IJ SOLN
INTRAMUSCULAR | Status: DC | PRN
  Administered 2020-05-03 (×2): 50 ug via INTRAVENOUS

## 2020-05-03 MED ORDER — LIDOCAINE HCL (CARDIAC) PF 100 MG/5ML IV SOSY
PREFILLED_SYRINGE | INTRAVENOUS | Status: DC | PRN
  Administered 2020-05-03: 80 mg via INTRAVENOUS

## 2020-05-03 MED ORDER — SODIUM CHLORIDE 0.9 % IV SOLN: INTRAVENOUS | Status: DC

## 2020-05-03 MED ORDER — GLYCOPYRROLATE 0.2 MG/ML IJ SOLN
INTRAMUSCULAR | Status: AC
  Filled 2020-05-03: qty 1

## 2020-05-03 MED ORDER — PROPOFOL 10 MG/ML IV BOLUS
INTRAVENOUS | Status: AC
  Filled 2020-05-03: qty 20

## 2020-05-03 NOTE — Progress Notes (Signed)
Discharge note: Reviewed discharge instructions with pt. Pt verbalized understanding. Per CLSW, pt does not need equipment for discharge at this time. Pt discharging with personal belongings. Staff wheeled pt out. Pt transported to home via a private vehicle by his friend, Simona Huh.

## 2020-05-03 NOTE — Evaluation (Signed)
Physical Therapy Evaluation Patient Details Name: Chad [name] MRN: [id number] DOB: 11/20/1950 Today's Date: 05/03/2020   History of Present Illness  Pt is a 69yo M admitted to [facility] on 05/01/20 for c/o abdominal pain, nausea, and bleeding in stool/emesis. Significant PMH includes: alcohol dependence with uncomplicated withdrawal, depression, anxiety. Imaging reveals diverticulosis without diverticulitis. 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 Pt s/p EGD today.    Clinical Impression  Pt is a 70 year old M admitted to hospital on 05/02/19 for GI bleed; pt s/p EGD today. At baseline, pt was mod I with short distance ambulation, ADL's, and IADL's; friend assisted with grocery shopping. Pt presents with generalized weakness, decreased standing balance, impaired safety awareness, impulsivity, agitation, and decreased activity tolerance resulting in impaired functional mobility from baseline. Due to deficits, pt required supervision - min guard for safety for transfers and short distance ambulation. Gait deviations including: early reciprocal gait, decreased step length/foot clearance, wide BOS, and increased lateral trunk deviation with weight shift increase the pt's risk of falls. Increased time required during session for pt self care ADL's including dressing and toileting; pt continues to report loose stoole with small amounts of bright red blood - RN notified. Pt able to be supervision for full body dressing with intermittent seated rest breaks and assistance for set up. Deficits limit the pt's ability to safely and independently perform ADL's, transfer, and ambulate. Pt will benefit from acute skilled PT services to address deficits for return to baseline function. At this time, PT recommends SNF at DC to improve overall safety with functional mobility for return to baseline function prior to return home. If pt declines, PT recommends DC home with HHPT/RN and intermittent  supervision to improve overall safety with functional mobility and decrease risk of falls.  Of note, pt initially agreeable for SNF recommendation stating he does not feel like he's safe to return home at this time due to generalized weakness and decreased balance. Pt reports that he feels that he truly needs long-term care due to medical issues and depression. At end of session, pt repeatedly asked PT if he could go downstairs to smoke a cigarette while he waited for SNF auth. PT explained that if the pt were to leave the hospital it would hinder his ability for placement at SNF. PT notified RN of conversation with pt; at this time RN entered pt room to discuss pt needs.     Follow Up Recommendations Home health PT;SNF;Supervision - Intermittent    Equipment Recommendations  None recommended by PT    Recommendations for Other Services       Precautions / Restrictions Precautions Precautions: Fall      Mobility  Bed Mobility               General bed mobility comments: Pt seated at EOB upon PT entry    Transfers Overall transfer level: Needs assistance   Transfers: Sit to/from Stand Sit to Stand: Min guard;Supervision         General transfer comment: Intermittent assistance of supervision - min guard for safety to perform STS transfers from EOB, recliner, and low height commode with SPC.  Ambulation/Gait Ambulation/Gait assistance: Supervision;Min guard Gait Distance (Feet): 35 Feet (70ft x1, 74ft x1) Assistive device: Straight cane       General Gait Details: Supervision-min guard to ambulate short distance with SPC. Pt demonstrates slowed, unsteady cadence, with early reciprocal gait, BLE ER, decreased step length/foot clearance bil, slowed cadence, increased  lat trunk deviation with weight shift, and wide BOS.      Balance Overall balance assessment: Needs assistance Sitting-balance support: No upper extremity supported;Feet supported Sitting balance-Leahy  Scale: Good Sitting balance - Comments: Good seated balance with BLE supported.     Standing balance-Leahy Scale: Fair Standing balance comment: Fair standing balance with use of SPC, requiring at least supervision for safety                             Pertinent Vitals/Pain Pain Assessment: Faces Faces Pain Scale: Hurts little more Pain Location: sacrum    Home Living Family/patient expects to be discharged to:: Private residence Living Arrangements: Alone Available Help at Discharge: Friend(s);Available PRN/intermittently Type of Home: Apartment Home Access: Stairs to enter Entrance Stairs-Rails: Can reach both Entrance Stairs-Number of Steps: 13 Home Layout: One level Home Equipment: Walker - 2 wheels;Cane - single point;Shower seat - built in      Prior Function Level of Independence: Independent with assistive device(s)         Comments: Pt is modified independent and uses RW for mobility. He is independent with most I/ADLs (bathing, dressing, cooking, cleaning). Pt reports he doesn't drive or work anymore. Friends assist with grocery shopping.     Hand Dominance   Dominant Hand: Right    Extremity/Trunk Assessment   Upper Extremity Assessment Upper Extremity Assessment: Overall WFL for tasks assessed;Generalized weakness (Observed to be grossly 3+/5)    Lower Extremity Assessment Lower Extremity Assessment: Overall WFL for tasks assessed;Generalized weakness (Observed to be grossly 3+/5)    Cervical / Trunk Assessment Cervical / Trunk Assessment: Normal  Communication   Communication: No difficulties  Cognition Arousal/Alertness: Awake/alert Behavior During Therapy: WFL for tasks assessed/performed;Impulsive Overall Cognitive Status: Within Functional Limits for tasks assessed                                 General Comments: Pt a&O x4 and able to follow 100% of 2-step commands      General Comments General comments (skin  integrity, edema, etc.): Erythema and c/o pain at sacrum    Exercises Other Exercises Other Exercises: Pt able to participate in transfers and gait with SPC from bed>bathroom>recliner. Pt presents with decreased activity tolerance, generalized weakness, impulsivity, and decreased standing balance which increase his risk of falls. Other Exercises: PT assisted pt with dressing. Pt able to stand to don brief, pants, shirt, and jacket x2 with wide BOS and no UE. Pt required increased assistance for donning shoes. Other Exercises: Pt educated regarding: DC rec to SNF to improve safety with mobility prior to DC home (pt agreeable at the time stating he does not feel safe enough to return home at this time) and activity modification.   Assessment/Plan    PT Assessment Patient needs continued PT services  PT Problem List Decreased strength;Decreased mobility;Decreased safety awareness;Decreased activity tolerance;Decreased balance;Pain       PT Treatment Interventions Gait training;Therapeutic exercise;Balance training;Stair training;Neuromuscular re-education;Functional mobility training;Therapeutic activities;Patient/family education    PT Goals (Current goals can be found in the Care Plan section)  Acute Rehab PT Goals Patient Stated Goal: to get better PT Goal Formulation: With patient Time For Goal Achievement: 05/17/20 Potential to Achieve Goals: Fair    Frequency Min 2X/week   Barriers to discharge Decreased caregiver support PRN assistance from friend    Co-evaluation  AM-PAC PT "6 Clicks" Mobility  Outcome Measure Help needed turning from your back to your side while in a flat bed without using bedrails?: None Help needed moving from lying on your back to sitting on the side of a flat bed without using bedrails?: A Little Help needed moving to and from a bed to a chair (including a wheelchair)?: A Little Help needed standing up from a chair using your arms  (e.g., wheelchair or bedside chair)?: A Little Help needed to walk in hospital room?: A Little Help needed climbing 3-5 steps with a railing? : A Lot 6 Click Score: 18    End of Session Equipment Utilized During Treatment: Gait belt Activity Tolerance: Patient tolerated treatment well;Patient limited by fatigue;Treatment limited secondary to agitation Patient left: in chair;with call bell/phone within reach;with chair alarm set Nurse Communication: Mobility status (pt wanting to go downstairs to smoke) PT Visit Diagnosis: Unsteadiness on feet (R26.81);Muscle weakness (generalized) (M62.81);History of falling (Z91.81);Difficulty in walking, not elsewhere classified (R26.2);Pain Pain - Right/Left:  (sacrum)    Time: 9381-8299 PT Time Calculation (min) (ACUTE ONLY): 25 min   Charges:   PT Evaluation $PT Eval Low Complexity: 1 Low PT Treatments $Therapeutic Activity: 8-22 mins       Vira Blanco, PT, DPT 2:50 PM,05/03/20

## 2020-05-03 NOTE — Anesthesia Postprocedure Evaluation (Signed)
Anesthesia Post Note  Patient: Chad [name]  Procedure(s) Performed: ESOPHAGOGASTRODUODENOSCOPY (EGD) (N/A )  Patient location during evaluation: Endoscopy Anesthesia Type: General Level of consciousness: awake Pain management: pain level controlled Vital Signs Assessment: post-procedure vital signs reviewed and stable Respiratory status: spontaneous breathing Cardiovascular status: blood pressure returned to baseline and stable Postop Assessment: no apparent nausea or vomiting Anesthetic complications: no   No complications documented.   Last Vitals:  Vitals:   05/03/20 0819 05/03/20 0857  BP: 120/73 116/80  Pulse: 69 77  Resp: 16 18  Temp: 37.1 C (!) 36 C  SpO2: 97% 100%    Last Pain:  Vitals:   05/03/20 0857  TempSrc: Temporal  PainSc:                  Emilio Math

## 2020-05-03 NOTE — Discharge Summary (Signed)
Physician Discharge Summary  Chad [name] KDT:[id number] DOB: [birth year] DOA: 05/01/2020  PCP: Chad Humphrey, MD  Admit date: 05/01/2020 Discharge date: 05/03/2020  Admitted From: Home Disposition:  Home  Recommendations for Outpatient Follow-up:  1. Follow up with PCP in 1-2 weeks 2. Please obtain BMP/CBC in one week 3. Please follow up on the following pending results:None  Home Health:Yes Equipment/Devices: None Discharge Condition: Stable CODE STATUS: full Diet recommendation: Heart Healthy   Brief/Interim Summary: Chad [name] a 70 y.o.malewith medical history significant foralcohol abuse, anxiety, and depression, presenting to the emergency department for evaluation of GI bleeding.Patient reports several days of periumbilical abdominal pain that has been fairly constant, moderate in intensity, and without any alleviating or exacerbating factors identified. He then went on to develop nausea with vomiting on 04/30/2020. He reports bright red blood in in his vomit and also reports bright red blood per rectum that also developed the same day. He denies any lightheadedness or chest pain. Denies fevers or chills.  Patient hemoglobin remained stable.  BUN and creatinine without any changes which was not consistent with GI bleed.  CT angio abdomen was negative for any contrast extravasation to localize GI bleed.  There was some extensive sigmoid diverticulosis without any sign of diverticulitis.  GI was consulted and he underwent EGD which was positive only for hiatal hernia.  Patient did not had any hematemesis or bleeding per rectum while in the hospital.  He had his colonoscopy 10 years ago and is due for a repeat colonoscopy which can be done as an outpatient.  PT evaluated him and initially recommended SNF placement, seems like poor patient effort to participate.  When TOC talked with him he said he want a long-term placement, once told that will not be possible with  current hospitalization and he has to pay out-of-pocket he become very irritated and decided to go back home with home health services which were ordered.  Patient was advised to stop using alcohol or avoid NSAID and follow-up with GI and primary care provider as an outpatient.  Discharge Diagnoses:  Principal Problem:   GI bleeding Active Problems:   Alcohol dependence with uncomplicated withdrawal (HCC)   Depression with anxiety   GI bleed   Abdominal pain  Discharge Instructions  Discharge Instructions    Diet - low sodium heart healthy   Complete by: As directed    Discharge instructions   Complete by: As directed    It was pleasure taking care of you. Avoid any alcohol or drugs like ibuprofen, Aleve or aspirin. As we discussed there was no significant concerns for bleeding from your stomach, you need a colonoscopy as an outpatient please follow-up with your gastroenterologist. Keep yourself well-hydrated and eat healthy.   Increase activity slowly   Complete by: As directed      Allergies as of 05/03/2020      Reactions   Morphine And Related Other (See Comments)   Causes "bad disposition"   Ibuprofen Other (See Comments)   Gi upset      Medication List    STOP taking these medications   HYDROcodone-acetaminophen 7.5-325 MG tablet Commonly known as: NORCO     TAKE these medications   acetaminophen 325 MG tablet Commonly known as: TYLENOL Take 2 tablets (650 mg total) by mouth every 6 (six) hours as needed for mild pain (pain score 1-3 or temp > 100.5).   feeding supplement Liqd Take 237 mLs by mouth 2 (two) times daily between meals.  folic acid 1 MG tablet Commonly known as: FOLVITE Take 1 tablet (1 mg total) by mouth daily.   hydrOXYzine 25 MG capsule Commonly known as: Vistaril Take 1 capsule (25 mg total) by mouth 3 (three) times daily as needed for anxiety or itching.   nicotine 21 mg/24hr patch Commonly known as: NICODERM CQ - dosed in mg/24  hours One 21mg  patch chest wall daily (okay to substitute generic)   pantoprazole 40 MG tablet Commonly known as: Protonix Take 1 tablet (40 mg total) by mouth 2 (two) times daily.   sertraline 50 MG tablet Commonly known as: ZOLOFT Take 3 tablets (150 mg total) by mouth daily.   thiamine 100 MG tablet Take 1 tablet (100 mg total) by mouth daily.   traZODone 100 MG tablet Commonly known as: DESYREL Take 1 tablet (100 mg total) by mouth at bedtime.       Follow-up Information    , MD. Schedule an appointment as soon as possible for a visit in 1 week(s).   Specialty: Gastroenterology Contact information: [address] [place] [address] [place] [place] [id number]        [phone], MD. Schedule an appointment as soon as possible for a visit.   Specialty: Family Medicine Contact information: [address] [place] [address] [address] [place] [phone]              Allergies  Allergen Reactions  . Morphine And Related Other (See Comments)    Causes "bad disposition"  . Ibuprofen Other (See Comments)    Gi upset    Consultations:  GI  Procedures/Studies: CT Angio Abd/Pel W and/or Wo Contrast  Result Date: 05/01/2020 CLINICAL DATA:  Rectal bleeding, abdominal pain EXAM: CTA ABDOMEN AND PELVIS WITHOUT AND WITH CONTRAST TECHNIQUE: Multidetector CT imaging of the abdomen and pelvis was performed using the standard protocol during bolus administration of intravenous contrast. Multiplanar reconstructed images and MIPs were obtained and reviewed to evaluate the vascular anatomy. CONTRAST:  06/29/2020 OMNIPAQUE IOHEXOL 350 MG/ML SOLN COMPARISON:  12/31/2019 FINDINGS: VASCULAR Aorta: Heavily calcified aorta.  No aneurysm or dissection. Celiac: Widely patent. SMA: Widely patent. Renals: Widely patent IMA: Widely patent Inflow: Atherosclerotic calcifications.  No aneurysm or dissection. Proximal Outflow: Scattered calcifications and noncalcified plaque. No aneurysm or  dissection. Veins: No obvious venous abnormality within the limitations of this arterial phase study. Review of the MIP images confirms the above findings. NON-VASCULAR Lower chest: No acute abnormality. Hepatobiliary: No focal liver abnormality is seen. Status post cholecystectomy. No biliary dilatation. Pancreas: No focal abnormality or ductal dilatation. Spleen: No focal abnormality.  Normal size. Adrenals/Urinary Tract: No adrenal abnormality. No focal renal abnormality. No stones or hydronephrosis. Urinary bladder is unremarkable. Stomach/Bowel: Sigmoid diverticulosis. No active diverticulitis. Stomach and small bowel decompressed. No evidence of contrast extravasation to suggest or localize GI bleed. Stomach and small bowel decompressed, unremarkable. Lymphatic: No adenopathy Reproductive: Mildly prominent prostate. Other: No free fluid or free air. Musculoskeletal: No acute bony abnormality. Postoperative changes in the lower lumbar spine and left hip. IMPRESSION: VASCULAR No contrast extravasation to localize GI bleed. Aortoiliac atherosclerosis. NON-VASCULAR Extensive sigmoid diverticulosis. Electronically Signed   By: 03/01/2020 M.D.   On: 05/01/2020 22:49     Subjective: Patient was seen after getting EGD.  He was just asking for more food stating that he is very hungry and very upset that we did not give him anything to eat since this morning.  Discharge Exam: Vitals:   05/03/20 0945  05/03/20 1024  BP: 110/77   Pulse:  (!) 109  Resp: (!) 22   Temp: (!) 96.8 F (36 C)   SpO2: 96%    Vitals:   05/03/20 0819 05/03/20 0857 05/03/20 0945 05/03/20 1024  BP: 120/73 116/80 110/77   Pulse: 69 77  (!) 109  Resp: 16 18 (!) 22   Temp: 98.7 F (37.1 C) (!) 96.8 F (36 C) (!) 96.8 F (36 C)   TempSrc: Oral Temporal Temporal   SpO2: 97% 100% 96%   Weight:      Height:        General: Pt is alert, awake, not in acute distress Cardiovascular: RRR, S1/S2 +, no rubs, no  gallops Respiratory: CTA bilaterally, no wheezing, no rhonchi Abdominal: Soft, NT, ND, bowel sounds + Extremities: no edema, no cyanosis   The results of significant diagnostics from this hospitalization (including imaging, microbiology, ancillary and laboratory) are listed below for reference.    Microbiology: Recent Results (from the past 240 hour(s))  SARS CORONAVIRUS 2 (TAT 6-24 HRS) Nasopharyngeal Nasopharyngeal Swab     Status: None   Collection Time: 05/02/20 12:59 AM   Specimen: Nasopharyngeal Swab  Result Value Ref Range Status   SARS Coronavirus 2 NEGATIVE NEGATIVE Final    Comment: (NOTE) SARS-CoV-2 target nucleic acids are NOT DETECTED.  The SARS-CoV-2 RNA is generally detectable in upper and lower respiratory specimens during the acute phase of infection. Negative results do not preclude SARS-CoV-2 infection, do not rule out co-infections with other pathogens, and should not be used as the sole basis for treatment or other patient management decisions. Negative results must be combined with clinical observations, patient history, and epidemiological information. The expected result is Negative.  Fact Sheet for Patients: HairSlick.nohttps://www.fda.gov/media/138098/download  Fact Sheet for Healthcare Providers: quierodirigir.comhttps://www.fda.gov/media/138095/download  This test is not yet approved or cleared by the [place] FDA and  has been authorized for detection and/or diagnosis of SARS-CoV-2 by FDA under an Emergency Use Authorization (EUA). This EUA will remain  in effect (meaning this test can be used) for the duration of the COVID-19 declaration under Se ction 564(b)(1) of the Act, 21 U.S.C. section 360bbb-3(b)(1), unless the authorization is terminated or revoked sooner.  Performed at [facility], [address] [place], [place] [id number]      Labs: BNP (last 3 results) No results for input(s): BNP in the last 8760 hours. Basic Metabolic Panel: Recent Labs   Lab 05/01/20 1834 05/02/20 0325 05/03/20 0447  NA 132* 133* 135  K 4.1 3.9 4.0  CL 96* 99 102  CO2 22 26 23   GLUCOSE 115* 97 112*  BUN 6* 6* <5*  CREATININE 0.56* 0.64 0.62  CALCIUM 9.1 8.5* 8.9  MG  --  1.9  --   PHOS  --  4.1  --    Liver Function Tests: Recent Labs  Lab 05/01/20 1834 05/02/20 0325 05/03/20 0447  AST 92* 109* 48*  ALT 43 44 31  ALKPHOS 138* 156* 149*  BILITOT 0.8 0.7 1.1  PROT 7.7 6.4* 6.1*  ALBUMIN 4.0 3.4* 3.2*   Recent Labs  Lab 05/01/20 1834  LIPASE 27   No results for input(s): AMMONIA in the last 168 hours. CBC: Recent Labs  Lab 05/01/20 1834 05/01/20 2359 05/02/20 0325 05/02/20 2041 05/03/20 0447  WBC 7.9  --   --   --   --   HGB 15.5 13.4 13.2 13.5 13.9  HCT 46.7 40.5 40.1 39.3 41.8  MCV 96.3  --   --   --   --   PLT 369  --   --   --   --    Cardiac Enzymes: No results for input(s): CKTOTAL, CKMB, CKMBINDEX, TROPONINI in the last 168 hours. BNP: Invalid input(s): POCBNP CBG: No results for input(s): GLUCAP in the last 168 hours. D-Dimer No results for input(s): DDIMER in the last 72 hours. Hgb A1c No results for input(s): HGBA1C in the last 72 hours. Lipid Profile No results for input(s): CHOL, HDL, LDLCALC, TRIG, CHOLHDL, LDLDIRECT in the last 72 hours. Thyroid function studies No results for input(s): TSH, T4TOTAL, T3FREE, THYROIDAB in the last 72 hours.  Invalid input(s): FREET3 Anemia work up No results for input(s): VITAMINB12, FOLATE, FERRITIN, TIBC, IRON, RETICCTPCT in the last 72 hours. Urinalysis    Component Value Date/Time   COLORURINE YELLOW (A) 02/04/2020 2335   APPEARANCEUR CLEAR (A) 02/04/2020 2335   LABSPEC 1.026 02/04/2020 2335   PHURINE 5.0 02/04/2020 2335   GLUCOSEU NEGATIVE 02/04/2020 2335   HGBUR NEGATIVE 02/04/2020 2335   BILIRUBINUR NEGATIVE 02/04/2020 2335   KETONESUR 80 (A) 02/04/2020 2335   PROTEINUR NEGATIVE 02/04/2020 2335   NITRITE NEGATIVE 02/04/2020 2335   LEUKOCYTESUR NEGATIVE  02/04/2020 2335   Sepsis Labs Invalid input(s): PROCALCITONIN,  WBC,  LACTICIDVEN Microbiology Recent Results (from the past 240 hour(s))  SARS CORONAVIRUS 2 (TAT 6-24 HRS) Nasopharyngeal Nasopharyngeal Swab     Status: None   Collection Time: 05/02/20 12:59 AM   Specimen: Nasopharyngeal Swab  Result Value Ref Range Status   SARS Coronavirus 2 NEGATIVE NEGATIVE Final    Comment: (NOTE) SARS-CoV-2 target nucleic acids are NOT DETECTED.  The SARS-CoV-2 RNA is generally detectable in upper and lower respiratory specimens during the acute phase of infection. Negative results do not preclude SARS-CoV-2 infection, do not rule out co-infections with other pathogens, and should not be used as the sole basis for treatment or other patient management decisions. Negative results must be combined with clinical observations, patient history, and epidemiological information. The expected result is Negative.  Fact Sheet for Patients: HairSlick.no  Fact Sheet for Healthcare Providers: quierodirigir.com  This test is not yet approved or cleared by the [place] FDA and  has been authorized for detection and/or diagnosis of SARS-CoV-2 by FDA under an Emergency Use Authorization (EUA). This EUA will remain  in effect (meaning this test can be used) for the duration of the COVID-19 declaration under Se ction 564(b)(1) of the Act, 21 U.S.C. section 360bbb-3(b)(1), unless the authorization is terminated or revoked sooner.  Performed at [facility], [address], [place], [place] [address]     Time coordinating discharge: Over 30 minutes  SIGNED:  Arnetha Courser, MD  [facility] 05/03/2020, 10:37 AM  If 7PM-7AM, please contact night-coverage [email or web address]  This record has been created using Conservation officer, historic buildings. Errors have been sought and corrected,but may not always be located. Such creation errors do  not reflect on the standard of care.

## 2020-05-03 NOTE — Op Note (Signed)
[facility] Gastroenterology Patient Name: Chad [name] Procedure Date: 05/03/2020 9:19 AM MRN: [id number] Account #: [id number] Date of Birth: [birth year] Admit Type: Inpatient Age: 70 Room: [facility] ENDO ROOM 3 Gender: Male Note Status: Finalized Procedure:             Upper GI endoscopy Indications:           Hematemesis Providers:             Wyline Mood MD, MD Referring MD:          Angus Palms (Referring MD) Medicines:             Monitored Anesthesia Care Complications:         No immediate complications. Procedure:             Pre-Anesthesia Assessment:                        - Prior to the procedure, a History and Physical was                         performed, and patient medications, allergies and                         sensitivities were reviewed. The patient's tolerance                         of previous anesthesia was reviewed.                        - The risks and benefits of the procedure and the                         sedation options and risks were discussed with the                         patient. All questions were answered and informed                         consent was obtained.                        - ASA Grade Assessment: III - A patient with severe                         systemic disease.                        After obtaining informed consent, the endoscope was                         passed under direct vision. Throughout the procedure,                         the patient's blood pressure, pulse, and oxygen                         saturations were monitored continuously. The Endoscope                         was introduced through the mouth, and advanced to the  third part of duodenum. The upper GI endoscopy was                         accomplished with ease. The patient tolerated the                         procedure well. Findings:      The examined duodenum was normal.      A large hiatal hernia was  present.      The examined esophagus was moderately tortuous.      The cardia and gastric fundus were normal on retroflexion. Impression:            - Normal examined duodenum.                        - Large hiatal hernia.                        - Tortuous esophagus.                        - No specimens collected. Recommendation:        - Return patient to hospital ward for ongoing care.                        - Clear liquid diet today.                        - PAtient reported on admission that he had rectal                         bleeding , if blood is sseen in the stool then will                         also need a colonoscopy if he is willing to do the                         prep . Procedure Code(s):     --- Professional ---                        [phone], Esophagogastroduodenoscopy, flexible,                         transoral; diagnostic, including collection of                         specimen(s) by brushing or washing, when performed                         (separate procedure) Diagnosis Code(s):     --- Professional ---                        K44.9, Diaphragmatic hernia without obstruction or                         gangrene                        Q39.9, Congenital malformation of esophagus,  unspecified                        K92.0, Hematemesis CPT copyright 2019 American Medical Association. All rights reserved. The codes documented in this report are preliminary and upon coder review may  be revised to meet current compliance requirements. Wyline Mood, MD Wyline Mood MD, MD 05/03/2020 9:38:14 AM This report has been signed electronically. Number of Addenda: 0 Note Initiated On: 05/03/2020 9:19 AM Estimated Blood Loss:  Estimated blood loss: none.      [facility]

## 2020-05-03 NOTE — TOC Transition Note (Signed)
Transition of Care [facility]) - CM/SW Discharge Note   Patient Details  Name: Chad [name] MRN: [id number] Date of Birth: 09/16/1950  [facility]) CM/SW Contact:  Margarito Liner, LCSW Phone Number: 05/03/2020, 3:16 PM   Clinical Narrative:  CSW called patient in room, introduced role, and explained that discharge planning would be discussed. PT recommending SNF placement but patient wants long-term care. CSW explained that he would have to have Medicaid or private pay for that. Patient said he would just go home. He is agreeable to home health. No agency preference. Notified him that will start with [facility] since they are in network with [employer]. [facility] is able to accept referral. No DME needs. No further concerns. CSW signing off.    Final next level of care: Home w Home Health Services Barriers to Discharge: No Barriers Identified   Patient Goals and CMS Choice     Choice offered to / list presented to : Patient  Discharge Placement                Patient to be transferred to facility by: Friend will pick him up   Patient and family notified of of transfer: 05/03/20  Discharge Plan and Services     Post Acute Care Choice: Home Health                    [facility] Arranged: RN,PT,Nurse's Aide,Social Work [employer] Agency: [facility] Date HH Agency Contacted: 05/03/20   Representative spoke with at [facility] Agency: Mellissa Kohut  Social Determinants of Health (SDOH) Interventions     Readmission Risk Interventions No flowsheet data found.

## 2020-05-03 NOTE — Transfer of Care (Signed)
Immediate Anesthesia Transfer of Care Note  Patient: Chad [name]  Procedure(s) Performed: ESOPHAGOGASTRODUODENOSCOPY (EGD) (N/A )  Patient Location: PACU and Endoscopy Unit  Anesthesia Type:General  Level of Consciousness: drowsy  Airway & Oxygen Therapy: Patient Spontanous Breathing  Post-op Assessment: Report given to RN and Post -op Vital signs reviewed and stable  Post vital signs: Reviewed and stable  Last Vitals:  Vitals Value Taken Time  BP 110/70   Temp    Pulse 114   Resp 12   SpO2 97     Last Pain:  Vitals:   05/03/20 0857  TempSrc: Temporal  PainSc:          Complications: No complications documented.

## 2020-05-03 NOTE — Anesthesia Procedure Notes (Signed)
Procedure Name: MAC Date/Time: 05/03/2020 9:38 AM Performed by: Lily Peer, April Carlyon, CRNA Pre-anesthesia Checklist: Emergency Drugs available, Suction available, Patient being monitored, Patient identified and Timeout performed Oxygen Delivery Method: Nasal cannula Induction Type: IV induction

## 2020-05-03 NOTE — Progress Notes (Signed)
PT Cancellation Note  Patient Details Name: Chad [name] MRN: [id number] DOB: 01/27/1951   Cancelled Treatment:    Reason Eval/Treat Not Completed: Pt declining to participate with PT eval this AM as he reports he's "too weak to do anything because he hasn't eaten." With help from RN, Bet, PT assisted pt with ordering food. Will follow up with therapy evaluation later today, as appropriate; pt and RN agreeable.   Vira Blanco, PT, DPT 11:22 AM,05/03/20

## 2020-05-03 NOTE — Anesthesia Preprocedure Evaluation (Signed)
Anesthesia Evaluation  Patient identified by MRN, date of birth, ID band Patient awake    Reviewed: Allergy & Precautions, H&P , NPO status , Patient's Chart, lab work & pertinent test results  Airway Mallampati: II       Dental  (+) Poor Dentition, Missing   Pulmonary neg pulmonary ROS, Current Smoker and Patient abstained from smoking.,    Pulmonary exam normal breath sounds clear to auscultation       Cardiovascular hypertension, negative cardio ROS Normal cardiovascular exam Rhythm:Regular Rate:Normal     Neuro/Psych  Headaches, PSYCHIATRIC DISORDERS Anxiety Depression    GI/Hepatic Neg liver ROS, GERD  ,  Endo/Other  negative endocrine ROS  Renal/GU CRF and Renal InsufficiencyRenal disease  negative genitourinary   Musculoskeletal negative musculoskeletal ROS (+)   Abdominal   Peds negative pediatric ROS (+)  Hematology negative hematology ROS (+)   Anesthesia Other Findings   Reproductive/Obstetrics negative OB ROS                             Anesthesia Physical Anesthesia Plan  ASA: III  Anesthesia Plan: General   Post-op Pain Management:    Induction: Intravenous  PONV Risk Score and Plan: 1 and Propofol infusion  Airway Management Planned: Nasal Cannula  Additional Equipment:   Intra-op Plan:   Post-operative Plan:   Informed Consent: I have reviewed the patients History and Physical, chart, labs and discussed the procedure including the risks, benefits and alternatives for the proposed anesthesia with the patient or authorized representative who has indicated his/her understanding and acceptance.       Plan Discussed with: CRNA, Anesthesiologist and Surgeon  Anesthesia Plan Comments:         Anesthesia Quick Evaluation
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
## 2020-05-04 ENCOUNTER — Encounter: Payer: Self-pay | Admitting: Gastroenterology

## 2020-06-13 ENCOUNTER — Encounter: Payer: Self-pay | Admitting: Gastroenterology

## 2020-06-13 ENCOUNTER — Ambulatory Visit: Payer: Medicare HMO | Admitting: Gastroenterology

## 2020-08-01 ENCOUNTER — Emergency Department: Payer: Medicare HMO

## 2020-08-01 ENCOUNTER — Inpatient Hospital Stay
Admission: EM | Admit: 2020-08-01 | Discharge: 2020-08-14 | DRG: 377 | Disposition: A | Discharge status: Home Health | Payer: Medicare HMO | Attending: Internal Medicine | Admitting: Internal Medicine

## 2020-08-01 ENCOUNTER — Inpatient Hospital Stay: Payer: Medicare HMO

## 2020-08-01 ENCOUNTER — Other Ambulatory Visit: Payer: Self-pay

## 2020-08-01 ENCOUNTER — Encounter: Payer: Self-pay | Admitting: Radiology

## 2020-08-01 DIAGNOSIS — K449 Diaphragmatic hernia without obstruction or gangrene: Secondary | ICD-10-CM | POA: Diagnosis present

## 2020-08-01 DIAGNOSIS — K7010 Alcoholic hepatitis without ascites: Secondary | ICD-10-CM | POA: Diagnosis present

## 2020-08-01 DIAGNOSIS — D539 Nutritional anemia, unspecified: Secondary | ICD-10-CM | POA: Diagnosis present

## 2020-08-01 DIAGNOSIS — E872 Acidosis: Secondary | ICD-10-CM | POA: Diagnosis present

## 2020-08-01 DIAGNOSIS — K922 Gastrointestinal hemorrhage, unspecified: Secondary | ICD-10-CM | POA: Diagnosis not present

## 2020-08-01 DIAGNOSIS — K921 Melena: Secondary | ICD-10-CM

## 2020-08-01 DIAGNOSIS — F1010 Alcohol abuse, uncomplicated: Secondary | ICD-10-CM | POA: Diagnosis present

## 2020-08-01 DIAGNOSIS — I10 Essential (primary) hypertension: Secondary | ICD-10-CM | POA: Diagnosis present

## 2020-08-01 DIAGNOSIS — K254 Chronic or unspecified gastric ulcer with hemorrhage: Principal | ICD-10-CM | POA: Diagnosis present

## 2020-08-01 DIAGNOSIS — K2970 Gastritis, unspecified, without bleeding: Secondary | ICD-10-CM | POA: Diagnosis present

## 2020-08-01 DIAGNOSIS — K2270 Barrett's esophagus without dysplasia: Secondary | ICD-10-CM | POA: Diagnosis present

## 2020-08-01 DIAGNOSIS — R579 Shock, unspecified: Secondary | ICD-10-CM | POA: Insufficient documentation

## 2020-08-01 DIAGNOSIS — F10139 Alcohol abuse with withdrawal, unspecified: Secondary | ICD-10-CM | POA: Diagnosis not present

## 2020-08-01 DIAGNOSIS — F339 Major depressive disorder, recurrent, unspecified: Secondary | ICD-10-CM | POA: Diagnosis present

## 2020-08-01 DIAGNOSIS — K253 Acute gastric ulcer without hemorrhage or perforation: Secondary | ICD-10-CM | POA: Diagnosis not present

## 2020-08-01 DIAGNOSIS — E869 Volume depletion, unspecified: Secondary | ICD-10-CM | POA: Diagnosis present

## 2020-08-01 DIAGNOSIS — E871 Hypo-osmolality and hyponatremia: Secondary | ICD-10-CM | POA: Diagnosis present

## 2020-08-01 DIAGNOSIS — D696 Thrombocytopenia, unspecified: Secondary | ICD-10-CM | POA: Diagnosis present

## 2020-08-01 DIAGNOSIS — G9341 Metabolic encephalopathy: Secondary | ICD-10-CM | POA: Diagnosis present

## 2020-08-01 DIAGNOSIS — K760 Fatty (change of) liver, not elsewhere classified: Secondary | ICD-10-CM | POA: Diagnosis present

## 2020-08-01 DIAGNOSIS — Z811 Family history of alcohol abuse and dependence: Secondary | ICD-10-CM

## 2020-08-01 DIAGNOSIS — R296 Repeated falls: Secondary | ICD-10-CM | POA: Diagnosis present

## 2020-08-01 DIAGNOSIS — Z9049 Acquired absence of other specified parts of digestive tract: Secondary | ICD-10-CM

## 2020-08-01 DIAGNOSIS — E669 Obesity, unspecified: Secondary | ICD-10-CM | POA: Diagnosis present

## 2020-08-01 DIAGNOSIS — F10231 Alcohol dependence with withdrawal delirium: Secondary | ICD-10-CM | POA: Diagnosis not present

## 2020-08-01 DIAGNOSIS — I700 Atherosclerosis of aorta: Secondary | ICD-10-CM | POA: Diagnosis present

## 2020-08-01 DIAGNOSIS — D638 Anemia in other chronic diseases classified elsewhere: Secondary | ICD-10-CM | POA: Diagnosis present

## 2020-08-01 DIAGNOSIS — Z6823 Body mass index (BMI) 23.0-23.9, adult: Secondary | ICD-10-CM

## 2020-08-01 DIAGNOSIS — N400 Enlarged prostate without lower urinary tract symptoms: Secondary | ICD-10-CM | POA: Diagnosis present

## 2020-08-01 DIAGNOSIS — F17210 Nicotine dependence, cigarettes, uncomplicated: Secondary | ICD-10-CM | POA: Diagnosis present

## 2020-08-01 DIAGNOSIS — Z885 Allergy status to narcotic agent status: Secondary | ICD-10-CM

## 2020-08-01 DIAGNOSIS — K5730 Diverticulosis of large intestine without perforation or abscess without bleeding: Secondary | ICD-10-CM | POA: Diagnosis present

## 2020-08-01 DIAGNOSIS — J690 Pneumonitis due to inhalation of food and vomit: Secondary | ICD-10-CM | POA: Diagnosis not present

## 2020-08-01 DIAGNOSIS — K250 Acute gastric ulcer with hemorrhage: Secondary | ICD-10-CM | POA: Diagnosis not present

## 2020-08-01 DIAGNOSIS — K269 Duodenal ulcer, unspecified as acute or chronic, without hemorrhage or perforation: Secondary | ICD-10-CM | POA: Diagnosis present

## 2020-08-01 DIAGNOSIS — Z79891 Long term (current) use of opiate analgesic: Secondary | ICD-10-CM

## 2020-08-01 DIAGNOSIS — Z205 Contact with and (suspected) exposure to viral hepatitis: Secondary | ICD-10-CM | POA: Diagnosis present

## 2020-08-01 DIAGNOSIS — K219 Gastro-esophageal reflux disease without esophagitis: Secondary | ICD-10-CM | POA: Diagnosis present

## 2020-08-01 DIAGNOSIS — M549 Dorsalgia, unspecified: Secondary | ICD-10-CM

## 2020-08-01 DIAGNOSIS — R578 Other shock: Secondary | ICD-10-CM | POA: Diagnosis present

## 2020-08-01 DIAGNOSIS — D649 Anemia, unspecified: Secondary | ICD-10-CM | POA: Diagnosis not present

## 2020-08-01 DIAGNOSIS — Z79899 Other long term (current) drug therapy: Secondary | ICD-10-CM

## 2020-08-01 DIAGNOSIS — E8729 Other acidosis: Secondary | ICD-10-CM

## 2020-08-01 DIAGNOSIS — D62 Acute posthemorrhagic anemia: Secondary | ICD-10-CM | POA: Diagnosis present

## 2020-08-01 DIAGNOSIS — F419 Anxiety disorder, unspecified: Secondary | ICD-10-CM | POA: Diagnosis present

## 2020-08-01 DIAGNOSIS — R7401 Elevation of levels of liver transaminase levels: Secondary | ICD-10-CM | POA: Diagnosis present

## 2020-08-01 DIAGNOSIS — Z7141 Alcohol abuse counseling and surveillance of alcoholic: Secondary | ICD-10-CM

## 2020-08-01 DIAGNOSIS — R1011 Right upper quadrant pain: Secondary | ICD-10-CM | POA: Diagnosis present

## 2020-08-01 DIAGNOSIS — K7460 Unspecified cirrhosis of liver: Secondary | ICD-10-CM | POA: Diagnosis present

## 2020-08-01 DIAGNOSIS — I208 Other forms of angina pectoris: Secondary | ICD-10-CM | POA: Diagnosis present

## 2020-08-01 DIAGNOSIS — K2980 Duodenitis without bleeding: Secondary | ICD-10-CM | POA: Diagnosis present

## 2020-08-01 DIAGNOSIS — R059 Cough, unspecified: Secondary | ICD-10-CM

## 2020-08-01 DIAGNOSIS — Z20822 Contact with and (suspected) exposure to covid-19: Secondary | ICD-10-CM | POA: Diagnosis present

## 2020-08-01 DIAGNOSIS — Z886 Allergy status to analgesic agent status: Secondary | ICD-10-CM

## 2020-08-01 DIAGNOSIS — E8809 Other disorders of plasma-protein metabolism, not elsewhere classified: Secondary | ICD-10-CM | POA: Diagnosis present

## 2020-08-01 DIAGNOSIS — Z86718 Personal history of other venous thrombosis and embolism: Secondary | ICD-10-CM

## 2020-08-01 LAB — URINALYSIS, COMPLETE (UACMP) WITH MICROSCOPIC
Bacteria, UA: NONE SEEN
Bilirubin Urine: NEGATIVE
Glucose, UA: NEGATIVE mg/dL
Hgb urine dipstick: NEGATIVE
Ketones, ur: NEGATIVE mg/dL
Leukocytes,Ua: NEGATIVE
Nitrite: NEGATIVE
Protein, ur: NEGATIVE mg/dL
Specific Gravity, Urine: 1.003 — ABNORMAL LOW (ref 1.005–1.030)
Squamous Epithelial / HPF: NONE SEEN (ref 0–5)
pH: 8 (ref 5.0–8.0)

## 2020-08-01 LAB — PROTIME-INR
INR: 1 (ref 0.8–1.2)
Prothrombin Time: 12.8 seconds (ref 11.4–15.2)

## 2020-08-01 LAB — CK: Total CK: 52 U/L (ref 49–397)

## 2020-08-01 LAB — URINE DRUG SCREEN, QUALITATIVE (ARMC ONLY)
Amphetamines, Ur Screen: NOT DETECTED
Barbiturates, Ur Screen: NOT DETECTED
Benzodiazepine, Ur Scrn: NOT DETECTED
Cannabinoid 50 Ng, Ur ~~LOC~~: NOT DETECTED
Cocaine Metabolite,Ur ~~LOC~~: NOT DETECTED
MDMA (Ecstasy)Ur Screen: NOT DETECTED
Methadone Scn, Ur: NOT DETECTED
Opiate, Ur Screen: NOT DETECTED
Phencyclidine (PCP) Ur S: NOT DETECTED
Tricyclic, Ur Screen: NOT DETECTED

## 2020-08-01 LAB — CBC WITH DIFFERENTIAL/PLATELET
Abs Immature Granulocytes: 0.04 10*3/uL (ref 0.00–0.07)
Basophils Absolute: 0.1 10*3/uL (ref 0.0–0.1)
Basophils Relative: 2 %
Eosinophils Absolute: 0.1 10*3/uL (ref 0.0–0.5)
Eosinophils Relative: 2 %
HCT: 35.5 % — ABNORMAL LOW (ref 39.0–52.0)
Hemoglobin: 12.2 g/dL — ABNORMAL LOW (ref 13.0–17.0)
Immature Granulocytes: 1 %
Lymphocytes Relative: 21 %
Lymphs Abs: 0.8 10*3/uL (ref 0.7–4.0)
MCH: 32.9 pg (ref 26.0–34.0)
MCHC: 34.4 g/dL (ref 30.0–36.0)
MCV: 95.7 fL (ref 80.0–100.0)
Monocytes Absolute: 0.4 10*3/uL (ref 0.1–1.0)
Monocytes Relative: 9 %
Neutro Abs: 2.6 10*3/uL (ref 1.7–7.7)
Neutrophils Relative %: 65 %
Platelets: 85 10*3/uL — ABNORMAL LOW (ref 150–400)
RBC: 3.71 MIL/uL — ABNORMAL LOW (ref 4.22–5.81)
RDW: 17.3 % — ABNORMAL HIGH (ref 11.5–15.5)
WBC: 4 10*3/uL (ref 4.0–10.5)
nRBC: 0 % (ref 0.0–0.2)

## 2020-08-01 LAB — COMPREHENSIVE METABOLIC PANEL
ALT: 65 U/L — ABNORMAL HIGH (ref 0–44)
AST: 205 U/L — ABNORMAL HIGH (ref 15–41)
Albumin: 2.7 g/dL — ABNORMAL LOW (ref 3.5–5.0)
Alkaline Phosphatase: 229 U/L — ABNORMAL HIGH (ref 38–126)
Anion gap: 17 — ABNORMAL HIGH (ref 5–15)
BUN: 6 mg/dL — ABNORMAL LOW (ref 8–23)
CO2: 22 mmol/L (ref 22–32)
Calcium: 8.3 mg/dL — ABNORMAL LOW (ref 8.9–10.3)
Chloride: 92 mmol/L — ABNORMAL LOW (ref 98–111)
Creatinine, Ser: 0.75 mg/dL (ref 0.61–1.24)
GFR, Estimated: >60 mL/min (ref >60)
Glucose, Bld: 102 mg/dL — ABNORMAL HIGH (ref 70–99)
Potassium: 3.7 mmol/L (ref 3.5–5.1)
Sodium: 131 mmol/L — ABNORMAL LOW (ref 135–145)
Total Bilirubin: 2.1 mg/dL — ABNORMAL HIGH (ref 0.3–1.2)
Total Protein: 5.6 g/dL — ABNORMAL LOW (ref 6.5–8.1)

## 2020-08-01 LAB — CBC
HCT: 26.2 % — ABNORMAL LOW (ref 39.0–52.0)
Hemoglobin: 8.5 g/dL — ABNORMAL LOW (ref 13.0–17.0)
MCH: 32.6 pg (ref 26.0–34.0)
MCHC: 32.4 g/dL (ref 30.0–36.0)
MCV: 100.4 fL — ABNORMAL HIGH (ref 80.0–100.0)
Platelets: 85 10*3/uL — ABNORMAL LOW (ref 150–400)
RBC: 2.61 MIL/uL — ABNORMAL LOW (ref 4.22–5.81)
RDW: 17.5 % — ABNORMAL HIGH (ref 11.5–15.5)
WBC: 5.2 10*3/uL (ref 4.0–10.5)
nRBC: 0.4 % — ABNORMAL HIGH (ref 0.0–0.2)

## 2020-08-01 LAB — BLOOD GAS, VENOUS
Acid-Base Excess: 0.7 mmol/L (ref 0.0–2.0)
Bicarbonate: 25.4 mmol/L (ref 20.0–28.0)
O2 Saturation: 68 %
Patient temperature: 37
pCO2, Ven: 40 mmHg — ABNORMAL LOW (ref 44.0–60.0)
pH, Ven: 7.41 (ref 7.250–7.430)
pO2, Ven: 35 mmHg (ref 32.0–45.0)

## 2020-08-01 LAB — LIPASE, BLOOD: Lipase: 25 U/L (ref 11–51)

## 2020-08-01 LAB — LACTIC ACID, PLASMA
Lactic Acid, Venous: 4.6 mmol/L (ref 0.5–1.9)
Lactic Acid, Venous: 4.1 mmol/L (ref 0.5–1.9)
Lactic Acid, Venous: 8.3 mmol/L (ref 0.5–1.9)
Lactic Acid, Venous: 3.3 mmol/L (ref 0.5–1.9)

## 2020-08-01 LAB — BRAIN NATRIURETIC PEPTIDE: B Natriuretic Peptide: 184.4 pg/mL — ABNORMAL HIGH (ref 0.0–100.0)

## 2020-08-01 LAB — TROPONIN I (HIGH SENSITIVITY)
Troponin I (High Sensitivity): 15 ng/L (ref <18)
Troponin I (High Sensitivity): 13 ng/L (ref <18)

## 2020-08-01 LAB — PHOSPHORUS: Phosphorus: 3.1 mg/dL (ref 2.5–4.6)

## 2020-08-01 LAB — HEMOGLOBIN AND HEMATOCRIT, BLOOD
HCT: 26.6 % — ABNORMAL LOW (ref 39.0–52.0)
Hemoglobin: 9.1 g/dL — ABNORMAL LOW (ref 13.0–17.0)

## 2020-08-01 LAB — RESP PANEL BY RT-PCR (FLU A&B, COVID) ARPGX2
Influenza A by PCR: NEGATIVE
Influenza B by PCR: NEGATIVE
SARS Coronavirus 2 by RT PCR: NEGATIVE

## 2020-08-01 LAB — PREPARE RBC (CROSSMATCH)

## 2020-08-01 LAB — MAGNESIUM: Magnesium: 1.6 mg/dL — ABNORMAL LOW (ref 1.7–2.4)

## 2020-08-01 LAB — ETHANOL: Alcohol, Ethyl (B): 57 mg/dL — ABNORMAL HIGH (ref <10)

## 2020-08-01 MED ORDER — LORAZEPAM 2 MG/ML IJ SOLN
0.0000 mg | Freq: Four times a day (QID) | INTRAMUSCULAR | Status: AC
  Administered 2020-08-01 – 2020-08-02 (×2): 2 mg via INTRAVENOUS
  Administered 2020-08-02: 4 mg via INTRAVENOUS
  Administered 2020-08-02 – 2020-08-03 (×3): 2 mg via INTRAVENOUS
  Filled 2020-08-01 (×6): qty 1
  Filled 2020-08-01: qty 2
  Filled 2020-08-01: qty 1

## 2020-08-01 MED ORDER — MAGNESIUM SULFATE 2 GM/50ML IV SOLN
2.0000 g | Freq: Once | INTRAVENOUS | Status: AC
  Administered 2020-08-01: 2 g via INTRAVENOUS
  Filled 2020-08-01: qty 50

## 2020-08-01 MED ORDER — ONDANSETRON HCL 4 MG/2ML IJ SOLN
4.0000 mg | Freq: Four times a day (QID) | INTRAMUSCULAR | Status: DC | PRN
  Administered 2020-08-02: 4 mg via INTRAVENOUS
  Filled 2020-08-01: qty 2

## 2020-08-01 MED ORDER — FOLIC ACID 5 MG/ML IJ SOLN
1.0000 mg | Freq: Every day | INTRAMUSCULAR | Status: DC
  Administered 2020-08-02 – 2020-08-04 (×3): 1 mg via INTRAVENOUS
  Filled 2020-08-01 (×3): qty 0.2

## 2020-08-01 MED ORDER — LORAZEPAM 2 MG PO TABS: 0.0000 mg | ORAL_TABLET | Freq: Four times a day (QID) | ORAL | Status: AC

## 2020-08-01 MED ORDER — SODIUM CHLORIDE 0.9 % IV SOLN
80.0000 mg | Freq: Once | INTRAVENOUS | Status: AC
  Administered 2020-08-01: 80 mg via INTRAVENOUS
  Filled 2020-08-01: qty 80

## 2020-08-01 MED ORDER — LORAZEPAM 2 MG/ML IJ SOLN: 0.0000 mg | Freq: Two times a day (BID) | INTRAMUSCULAR | Status: DC

## 2020-08-01 MED ORDER — SODIUM CHLORIDE 0.9 % IV BOLUS
1000.0000 mL | Freq: Once | INTRAVENOUS | Status: AC
  Administered 2020-08-01: 1000 mL via INTRAVENOUS

## 2020-08-01 MED ORDER — SODIUM CHLORIDE 0.9 % IV SOLN: 10.0000 mL/h | Freq: Once | INTRAVENOUS | Status: DC

## 2020-08-01 MED ORDER — THIAMINE HCL 100 MG PO TABS: 100.0000 mg | ORAL_TABLET | Freq: Every day | ORAL | Status: DC

## 2020-08-01 MED ORDER — THIAMINE HCL 100 MG/ML IJ SOLN
100.0000 mg | Freq: Every day | INTRAMUSCULAR | Status: DC
  Administered 2020-08-02: 100 mg via INTRAVENOUS
  Filled 2020-08-01: qty 2

## 2020-08-01 MED ORDER — IOHEXOL 350 MG/ML SOLN
100.0000 mL | Freq: Once | INTRAVENOUS | Status: AC | PRN
  Administered 2020-08-01: 100 mL via INTRAVENOUS

## 2020-08-01 MED ORDER — LORAZEPAM 2 MG PO TABS: 0.0000 mg | ORAL_TABLET | Freq: Two times a day (BID) | ORAL | Status: DC

## 2020-08-01 MED ORDER — DOCUSATE SODIUM 100 MG PO CAPS
100.0000 mg | ORAL_CAPSULE | Freq: Two times a day (BID) | ORAL | Status: DC | PRN
  Administered 2020-08-08: 100 mg via ORAL
  Filled 2020-08-01: qty 1

## 2020-08-01 MED ORDER — THIAMINE HCL 100 MG/ML IJ SOLN
Freq: Once | INTRAVENOUS | Status: AC
  Filled 2020-08-01: qty 1000

## 2020-08-01 MED ORDER — IOHEXOL 300 MG/ML  SOLN
100.0000 mL | Freq: Once | INTRAMUSCULAR | Status: AC | PRN
  Administered 2020-08-01: 100 mL via INTRAVENOUS

## 2020-08-01 MED ORDER — PANTOPRAZOLE SODIUM 40 MG IV SOLR: 40.0000 mg | Freq: Two times a day (BID) | INTRAVENOUS | Status: DC

## 2020-08-01 MED ORDER — POLYETHYLENE GLYCOL 3350 17 G PO PACK: 17.0000 g | PACK | Freq: Every day | ORAL | Status: DC | PRN

## 2020-08-01 MED ORDER — SODIUM CHLORIDE 0.9 % IV BOLUS
500.0000 mL | Freq: Once | INTRAVENOUS | Status: AC
  Administered 2020-08-01: 500 mL via INTRAVENOUS

## 2020-08-01 MED ORDER — SODIUM CHLORIDE 0.9 % IV SOLN
8.0000 mg/h | INTRAVENOUS | Status: DC
  Administered 2020-08-01 – 2020-08-02 (×2): 8 mg/h via INTRAVENOUS
  Filled 2020-08-01 (×3): qty 80

## 2020-08-01 MED ORDER — SODIUM CHLORIDE 0.9 % IV SOLN
2.0000 g | INTRAVENOUS | Status: DC
  Administered 2020-08-01 – 2020-08-02 (×2): 2 g via INTRAVENOUS
  Filled 2020-08-01: qty 2
  Filled 2020-08-01 (×2): qty 20

## 2020-08-01 MED ORDER — LORAZEPAM 2 MG/ML IJ SOLN
1.0000 mg | Freq: Once | INTRAMUSCULAR | Status: AC
  Administered 2020-08-01: 1 mg via INTRAVENOUS
  Filled 2020-08-01: qty 1

## 2020-08-01 MED ORDER — THIAMINE HCL 100 MG/ML IJ SOLN
100.0000 mg | Freq: Every day | INTRAMUSCULAR | Status: DC
  Administered 2020-08-01: 100 mg via INTRAVENOUS
  Filled 2020-08-01: qty 2
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
## 2020-08-01 NOTE — ED Notes (Signed)
Pt stated that he need to have BM. Pt able to move from stretcher to toilet. Pt noted to become pale then had large BM with tarry stool and gross amounts of blood. Pt assisted back to bed. Pt now tachycardic and hypotensive. MD notified.

## 2020-08-01 NOTE — ED Triage Notes (Signed)
Pt to ED [facility] from home for chief complaint of cp x6-8 weeks. Weakness x3weeks. Daily drinker, last drink this morning, drinks pint a day.  4 asa given PTA  +nausea  RR even and unlabored.   Neighbor helps care for pt, states cannot care for self at home   Pt unable to reach sig pad, verbalizes understanding of MSE waiver   JEnise PA at bedside

## 2020-08-01 NOTE — Progress Notes (Signed)
Called to admit patient for sepsis. Patient is seen and examined in the emergency room, he is lethargic but arouses to verbal stimuli. Noted to be tachycardic and hypotensive on the monitor and had received his fluid requirement for sepsis.  Lactic acid was elevated at 4.6 with no obvious source of infection at this time. Patient with a significant history of alcohol abuse and has right upper quadrant pain with abnormal liver enzymes. Informed by RN that patient had a large bowel movement that was dark and tarry with gross amount of blood in it. I have asked the PA to consult intensivist since this patient is unstable.

## 2020-08-01 NOTE — Consult Note (Signed)
PHARMACY CONSULT NOTE - FOLLOW UP  Pharmacy Consult for Electrolyte Monitoring and Replacement   Recent Labs: Potassium (mmol/L)  Date Value  08/01/2020 3.7   Magnesium (mg/dL)  Date Value  20/60/1561 1.6 (L)   Calcium (mg/dL)  Date Value  53/79/4327 8.3 (L)   Albumin (g/dL)  Date Value  61/47/0929 2.7 (L)   Phosphorus (mg/dL)  Date Value  57/47/3403 3.1   Sodium (mmol/L)  Date Value  08/01/2020 131 (L)   Corrected Ca: 9.3  Assessment: Patient with a significant history of alcohol abuse and has right upper quadrant pain with abnormal liver enzymes. Patient with large bowel movement that was dark and tarry with gross amount of blood in it. Pt admitted to ICU.  Hbg 12.2 > 8.5  Hypomagnesemia  Mag 1.6  Goal of Therapy:  Electrolytes WNL  Plan:   2 gram IV magnesium x 1  Recheck Mag with all other electrolytes with AM labs   Sharen Hones ,PharmD, BCPS Clinical Pharmacist 08/01/2020 7:46 PM
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
## 2020-08-01 NOTE — Progress Notes (Signed)
1840: Received patient from the ED  Via stretcher. His diaper is soaked with blood and overflowed on his paper pad. Blood on his hands, arms and legs too. Patient cleaned and given CHG bath.   Patient connected to the monitor. VS taken. T: 98.8. Ordered maintenance IV fluid started. Patient has Protonix infusing at 8mg /hr= 35ml/hr from ED.   No complaints of pain nor any discomfort.   Patient is drowsy but easily arousable. He is oriented to self, place, time and situation.  A little intermittent confusion.   Admission questions started but patient so weak and wanted to sleep. Endorsed to Sylvian RN-incoming nurse.

## 2020-08-01 NOTE — Plan of Care (Signed)
On going care plan shared with patient. Pt expressed an understanding but requested to be left alone.. Call bell in place and bed alarm active.

## 2020-08-01 NOTE — ED Notes (Signed)
Pt noted to have another BM with dark, tarry stool. Pt cleaned and provided with new brief, gown, and linens.

## 2020-08-01 NOTE — Progress Notes (Signed)
Review of labs reveal hgb of 9.6. No signs of active bleeding. Pt reports Ijust want to be left alone so I can die in peace. People keep coming in here and bothering me. Plan of care re-evaluated with pt and pt care directives stressed to patient. Pt expressed an understanding , followed by I still want to be left alone. On going assessment

## 2020-08-01 NOTE — ED Notes (Signed)
Pt informed of need for emergent blood due to ongoing GI bleeding and change in vital signs. Pt provided verbal consent to administer blood.

## 2020-08-02 ENCOUNTER — Inpatient Hospital Stay: Payer: Medicare HMO | Admitting: Anesthesiology

## 2020-08-02 ENCOUNTER — Encounter: Payer: Self-pay | Admitting: Internal Medicine

## 2020-08-02 ENCOUNTER — Encounter
Admission: EM | Disposition: A | Discharge status: Home Health | Payer: Self-pay | Source: Home / Self Care | Attending: Internal Medicine

## 2020-08-02 DIAGNOSIS — E872 Acidosis: Secondary | ICD-10-CM | POA: Diagnosis not present

## 2020-08-02 DIAGNOSIS — K7010 Alcoholic hepatitis without ascites: Secondary | ICD-10-CM

## 2020-08-02 DIAGNOSIS — K921 Melena: Secondary | ICD-10-CM

## 2020-08-02 DIAGNOSIS — K269 Duodenal ulcer, unspecified as acute or chronic, without hemorrhage or perforation: Secondary | ICD-10-CM

## 2020-08-02 DIAGNOSIS — K922 Gastrointestinal hemorrhage, unspecified: Secondary | ICD-10-CM | POA: Diagnosis not present

## 2020-08-02 HISTORY — PX: ESOPHAGOGASTRODUODENOSCOPY (EGD) WITH PROPOFOL: SHX5813

## 2020-08-02 LAB — COMPREHENSIVE METABOLIC PANEL
ALT: 35 U/L (ref 0–44)
AST: 82 U/L — ABNORMAL HIGH (ref 15–41)
Albumin: 1.8 g/dL — ABNORMAL LOW (ref 3.5–5.0)
Alkaline Phosphatase: 120 U/L (ref 38–126)
Anion gap: 7 (ref 5–15)
BUN: 8 mg/dL (ref 8–23)
CO2: 25 mmol/L (ref 22–32)
Calcium: 6.8 mg/dL — ABNORMAL LOW (ref 8.9–10.3)
Chloride: 102 mmol/L (ref 98–111)
Creatinine, Ser: 0.74 mg/dL (ref 0.61–1.24)
GFR, Estimated: >60 mL/min (ref >60)
Glucose, Bld: 151 mg/dL — ABNORMAL HIGH (ref 70–99)
Potassium: 4 mmol/L (ref 3.5–5.1)
Sodium: 134 mmol/L — ABNORMAL LOW (ref 135–145)
Total Bilirubin: 0.9 mg/dL (ref 0.3–1.2)
Total Protein: 3.5 g/dL — ABNORMAL LOW (ref 6.5–8.1)

## 2020-08-02 LAB — MAGNESIUM: Magnesium: 2.1 mg/dL (ref 1.7–2.4)

## 2020-08-02 LAB — PREPARE RBC (CROSSMATCH)

## 2020-08-02 LAB — HEMOGLOBIN AND HEMATOCRIT, BLOOD
HCT: 23.6 % — ABNORMAL LOW (ref 39.0–52.0)
HCT: 24.8 % — ABNORMAL LOW (ref 39.0–52.0)
HCT: 32.5 % — ABNORMAL LOW (ref 39.0–52.0)
Hemoglobin: 8 g/dL — ABNORMAL LOW (ref 13.0–17.0)
Hemoglobin: 8.4 g/dL — ABNORMAL LOW (ref 13.0–17.0)
Hemoglobin: 11.3 g/dL — ABNORMAL LOW (ref 13.0–17.0)

## 2020-08-02 LAB — HEPATITIS C ANTIBODY: HCV Ab: REACTIVE — AB

## 2020-08-02 LAB — PHOSPHORUS: Phosphorus: 3.4 mg/dL (ref 2.5–4.6)

## 2020-08-02 SURGERY — ESOPHAGOGASTRODUODENOSCOPY (EGD) WITH PROPOFOL
Anesthesia: General

## 2020-08-02 MED ORDER — DEXMEDETOMIDINE (PRECEDEX) IN NS 20 MCG/5ML (4 MCG/ML) IV SYRINGE
PREFILLED_SYRINGE | INTRAVENOUS | Status: AC
  Filled 2020-08-02: qty 5

## 2020-08-02 MED ORDER — PROPOFOL 10 MG/ML IV BOLUS
INTRAVENOUS | Status: AC
  Filled 2020-08-02: qty 20

## 2020-08-02 MED ORDER — PANTOPRAZOLE SODIUM 40 MG IV SOLR
40.0000 mg | Freq: Two times a day (BID) | INTRAVENOUS | Status: DC
  Administered 2020-08-02 – 2020-08-04 (×4): 40 mg via INTRAVENOUS
  Filled 2020-08-02 (×5): qty 40

## 2020-08-02 MED ORDER — SODIUM CHLORIDE 0.9 % IV SOLN: INTRAVENOUS | Status: DC | PRN

## 2020-08-02 MED ORDER — LACTATED RINGERS IV BOLUS
1000.0000 mL | Freq: Once | INTRAVENOUS | Status: AC
  Administered 2020-08-02: 1000 mL via INTRAVENOUS

## 2020-08-02 MED ORDER — MIDAZOLAM HCL 2 MG/2ML IJ SOLN
INTRAMUSCULAR | Status: AC
  Filled 2020-08-02: qty 2

## 2020-08-02 MED ORDER — THIAMINE HCL 100 MG/ML IJ SOLN: 100.0000 mg | Freq: Every day | INTRAMUSCULAR | Status: DC

## 2020-08-02 MED ORDER — PROPOFOL 10 MG/ML IV BOLUS
INTRAVENOUS | Status: DC | PRN
  Administered 2020-08-02: 50 mg via INTRAVENOUS

## 2020-08-02 MED ORDER — SODIUM CHLORIDE 0.9% IV SOLUTION: Freq: Once | INTRAVENOUS | Status: AC

## 2020-08-02 MED ORDER — DEXMEDETOMIDINE HCL IN NACL 400 MCG/100ML IV SOLN
0.4000 ug/kg/h | INTRAVENOUS | Status: DC
  Administered 2020-08-02: 0.8 ug/kg/h via INTRAVENOUS
  Administered 2020-08-02: 0.4 ug/kg/h via INTRAVENOUS
  Administered 2020-08-03: 0.5 ug/kg/h via INTRAVENOUS
  Filled 2020-08-02 (×3): qty 100

## 2020-08-02 MED ORDER — SODIUM CHLORIDE 0.9 % IV SOLN
50.0000 ug/h | INTRAVENOUS | Status: DC
  Administered 2020-08-02: 50 ug/h via INTRAVENOUS
  Filled 2020-08-02 (×3): qty 1

## 2020-08-02 MED ORDER — THIAMINE HCL 100 MG/ML IJ SOLN
500.0000 mg | Freq: Every day | INTRAVENOUS | Status: AC
  Administered 2020-08-02 – 2020-08-04 (×3): 500 mg via INTRAVENOUS
  Filled 2020-08-02 (×3): qty 5

## 2020-08-02 MED ORDER — MIDAZOLAM HCL 2 MG/2ML IJ SOLN
INTRAMUSCULAR | Status: DC | PRN
  Administered 2020-08-02: 2 mg via INTRAVENOUS
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
## 2020-08-02 NOTE — Progress Notes (Signed)
Review of labs reveal correction in electrolytes. Noted hgb value decreased from 9.2 to 8.0. No active signs of bleeding noted. On going assessment. Noted increase in CIWA values. meds given to aide in withdrawal management. On going assessment

## 2020-08-02 NOTE — Transfer of Care (Signed)
Immediate Anesthesia Transfer of Care Note  Patient: Chad [name]  Procedure(s) Performed: ESOPHAGOGASTRODUODENOSCOPY (EGD) WITH PROPOFOL (N/A )  Patient Location: PACU and ICU  Anesthesia Type:General  Level of Consciousness: drowsy and patient cooperative  Airway & Oxygen Therapy: Patient Spontanous Breathing  Post-op Assessment: Report given to RN and Post -op Vital signs reviewed and stable  Post vital signs: Reviewed and stable  Last Vitals:  Vitals Value Taken Time  BP 101/56 08/02/20 1613  Temp    Pulse    Resp 16 08/02/20 1613  SpO2 97 % 08/02/20 1613    Last Pain:  Vitals:   08/02/20 1547  TempSrc:   PainSc: 0-No pain      Patients Stated Pain Goal: 0 (08/01/20 1930)  Complications: No complications documented.
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
## 2020-08-02 NOTE — Op Note (Signed)
[facility] Gastroenterology Patient Name: Chad [name] Procedure Date: 08/02/2020 3:50 PM MRN: [id number] Account #: [id number] Date of Birth: 25-Jun-1950 Admit Type: Inpatient Age: 70 Room: [facility] ENDO ROOM 3 Gender: Male Note Status: Finalized Procedure:             Upper GI endoscopy Indications:           Melena Providers:             [name] MD, MD Referring MD:          Angus Palms (Referring MD) Medicines:             Propofol per Anesthesia Procedure:             Pre-Anesthesia Assessment:                        - Prior to the procedure, a History and Physical was                         performed, and patient medications and allergies were                         reviewed. The patient's tolerance of previous                         anesthesia was also reviewed. The risks and benefits                         of the procedure and the sedation options and risks                         were discussed with the patient. All questions were                         answered, and informed consent was obtained. Prior                         Anticoagulants: The patient has taken no previous                         anticoagulant or antiplatelet agents. ASA Grade                         Assessment: III - A patient with severe systemic                         disease. After reviewing the risks and benefits, the                         patient was deemed in satisfactory condition to                         undergo the procedure.                        After obtaining informed consent, the endoscope was                         passed under direct vision. Throughout the procedure,  the patient's blood pressure, pulse, and oxygen                         saturations were monitored continuously. The Endoscope                         was introduced through the mouth, and advanced to the                         second part of duodenum. The upper GI  endoscopy was                         accomplished without difficulty. The patient tolerated                         the procedure well. Findings:      A small hiatal hernia was present.      One non-bleeding cratered gastric ulcer with no stigmata of bleeding was       found in the gastric antrum.      Diffuse moderate inflammation characterized by erythema was found in the       gastric antrum.      Few non-bleeding cratered duodenal ulcers with no stigmata of bleeding       were found in the duodenal bulb.      Localized inflammation was found in the duodenal bulb. Impression:            - Small hiatal hernia.                        - Non-bleeding gastric ulcer with no stigmata of                         bleeding.                        - Gastritis.                        - Non-bleeding duodenal ulcers with no stigmata of                         bleeding.                        - Duodenitis.                        - No specimens collected.                        - No sign of any fresh or old blood in the stomach or                         the duodenum Recommendation:        - Return patient to ICU for ongoing care.                        - NPO.                        - Continue present medications.                        -  Use a proton pump inhibitor PO BID. Procedure Code(s):     --- Professional ---                        [phone], Esophagogastroduodenoscopy, flexible,                         transoral; diagnostic, including collection of                         specimen(s) by brushing or washing, when performed                         (separate procedure) Diagnosis Code(s):     --- Professional ---                        K92.1, Melena (includes Hematochezia)                        K26.9, Duodenal ulcer, unspecified as acute or                         chronic, without hemorrhage or perforation                        K25.9, Gastric ulcer, unspecified as acute or chronic,                          without hemorrhage or perforation CPT copyright 2019 [employer]. All rights reserved. The codes documented in this report are preliminary and upon coder review may  be revised to meet current compliance requirements. [name] MD, MD 08/02/2020 3:59:47 PM This report has been signed electronically. Number of Addenda: 0 Note Initiated On: 08/02/2020 3:50 PM      [facility]

## 2020-08-02 NOTE — Progress Notes (Signed)
NAME:  Chad [name], MRN:  [id number], DOB:  1950-04-24, LOS: 1 ADMISSION DATE:  08/01/2020, CONSULTATION DATE: 08/01/2020 REFERRING MD: Dolan Amen, CHIEF COMPLAINT: Chest Pain   History of Present Illness:  This is a 70 yo male who presented to [facility] ER on 04/13 via EMS with c/o intermittent chest pain onset 6-8 weeks prior to presentation.  He also endorsed a 3 week hx of weakness along with frequent falls, and thinks he hit his head during one of the falls.  He is a daily drinker with his last drink the morning of 04/13.  When asked how much he drinks he stated "I don't know" although he told ER triage nurse he drinks a pint a day.  Pt recently admitted from 01/12-01/13 with acute GI bleed, however he did not have any active bleeding while hospitalized.  He underwent an EGD on 01/13 which revealed the duodenum was normal; a large hiatal hernia; and tortuous esophagus.  GI recommended at that time if further bleeding occurred would need colonoscopy.  ED course  Pt endorsed RUQ pain, therefore CT Abd Pelvis ordered which revealed no acute findings, but extensive hepatic steatosis.  Initial ER labs revealed glucose 102, BUN 6, anion gap 17, alk phos 229, albumin 2.7, AST 205, ALT 65, total bilirubin 2.1, BNP 184.4, lactic acid 4.6, hgb 12.2, hct 35.5, platelets 85, PT 12.8, and INR 1.0. However, pt later became pale and had a large tarry stool with gross amounts of blood.  Pt subsequently became tachycardic and hypotensive bp 75/53.  He received aggressive fluid resuscitation with sbp increasing to the 90's.  Repeat hgb 8.5/hct 26.2, 2 units of pRBC's ordered by ER physician.  Pt denies any active bleeding at home, NSAID use, or anticoagulation medication use. Although he is slightly confused and a poor historian.  PCCM team contacted for ICU admission due to decline in clinical status.    Significant Hospital Events: Including procedures, antibiotic start and stop dates in addition to other  pertinent events   . Pt admit . 4/14 GI consulted  Interim History / Subjective:  Patient had multiple bowel movement with dark tarry stool overnight.  He received 2 units of PRBCs but his hemoglobin remained low  Objective   Blood pressure 102/64, pulse 93, temperature 98.2 F (36.8 C), temperature source Oral, resp. rate (!) 25, height 5' 8"  (1.727 m), weight 72.9 kg, SpO2 99 %.        Intake/Output Summary (Last 24 hours) at 08/02/2020 0833 Last data filed at 08/02/2020 0400 Gross per 24 hour  Intake 1065.37 ml  Output 400 ml  Net 665.37 ml   Filed Weights   08/01/20 1106 08/02/20 0500  Weight: 73.9 kg 72.9 kg    Examination: General: chronically ill appearing disheveled male, resting in bed HENT: Atraumatic, normocephalic, supple, no JVD  Lungs: clear throughout, even, non labored, no wheezes Cardiovascular: sinus tach, no R/G, 2+ radial/2+ distal pulses, 1+ bilateral lower extremity edema Abdomen: Soft +BS x4, mild generalized tenderness,  Extremities: moves all extremities  Neuro: Awake but confused, following simple commands, moving all 4 extremities spontaneously Skin: Multiple bruises marks on bilateral lower extremities  Labs/imaging that I havepersonally reviewed  (right click and "Reselect all SmartList Selections" daily)  04/13~11:27am: Glucose 102, BUN 6, anion gap 17, alk phos 229, albumin 2.7, AST 205, ALT 65, total bilirubin 2.1, BNP 184.4, lactic acid 4.6, hgb 12.2, hct 35.5, platelets 85, PT 12.8, and INR 1.0 04/13~1604: hgb 8.5, hct 26.2, lactic  acid 8.3, platelets 85 04/13: EKG sinus tach hr 105, no acute ischemic changes  04/13: CT Abd Pelvis revealed no acute findings. Extensive hepatic steatosis.  No liver masses. Left colon diverticulosis.  No evidence of diverticulitis. Aortic atherosclerosis.  Resolved Hospital Problem list   Hemorrhagic shock  Assessment & Plan:  Acute upper GI bleeding Acute blood loss anemia Hx: Barrett's esophagus  Patient  continued to have black tarry stools, received 2 units of PRBCs but his hemoglobin remained 8 Serial H&H q6hrs  Continue protonix and octreotide gtt CT angiogram of abdomen pelvis is negative for active extravasation GI consulted for possible endoscopy Continue ceftriaxone for SBP prophylaxis Maintain map >65  Hyponatremia Hypomagnesia  Closely monitor electrolytes Replace electrolytes as indicated   Severe lactic acidosis, improving Continue aggressive fluid resuscitation  Trend lactic acid   Acute alcoholic hepatitis LFTs are improving Closely monitor  ETOH abuse; now with alcohol withdrawal  Continue CIWA protocol  MVI, folic acid, thiamine   Frequent falls CT was negative for acute findings Fall precautions  PT/OT evaluation  Best practice (right click and "Reselect all SmartList Selections" daily)  Diet:  NPO Pain/Anxiety/Delirium protocol (if indicated): Yes (RASS goal 0) VAP protocol (if indicated): Not indicated DVT prophylaxis: Contraindicated GI prophylaxis: PPI Glucose control:  SSI No Central venous access:  N/A Arterial line:  N/A Foley:  N/A Mobility:  bed rest  PT consulted: N/A Last date of multidisciplinary goals of care discussion [N/A] Code Status:  full code Disposition: ICU  Labs   CBC: Recent Labs  Lab 08/01/20 1127 08/01/20 1604 08/01/20 2001 08/02/20 0311  WBC 4.0 5.2  --   --   NEUTROABS 2.6  --   --   --   HGB 12.2* 8.5* 9.1* 8.0*  HCT 35.5* 26.2* 26.6* 23.6*  MCV 95.7 100.4*  --   --   PLT 85* 85*  --   --     Basic Metabolic Panel: Recent Labs  Lab 08/01/20 1127 08/01/20 1343 08/02/20 0311  NA 131*  --  134*  K 3.7  --  4.0  CL 92*  --  102  CO2 22  --  25  GLUCOSE 102*  --  151*  BUN 6*  --  8  CREATININE 0.75  --  0.74  CALCIUM 8.3*  --  6.8*  MG  --  1.6* 2.1  PHOS  --  3.1 3.4   GFR: Estimated Creatinine Clearance: 86.7 mL/min (by C-G formula based on SCr of 0.74 mg/dL). Recent Labs  Lab 08/01/20 1123  08/01/20 1127 08/01/20 1343 08/01/20 1604 08/01/20 1606 08/01/20 2001  WBC  --  4.0  --  5.2  --   --   LATICACIDVEN 4.6*  --  4.1*  --  8.3* 3.3*    Liver Function Tests: Recent Labs  Lab 08/01/20 1127 08/02/20 0311  AST 205* 82*  ALT 65* 35  ALKPHOS 229* 120  BILITOT 2.1* 0.9  PROT 5.6* 3.5*  ALBUMIN 2.7* 1.8*   Recent Labs  Lab 08/01/20 1127  LIPASE 25   No results for input(s): AMMONIA in the last 168 hours.  ABG    Component Value Date/Time   HCO3 25.4 08/01/2020 1343   O2SAT 68.0 08/01/2020 1343     Coagulation Profile: Recent Labs  Lab 08/01/20 1127  INR 1.0    Cardiac Enzymes: Recent Labs  Lab 08/01/20 1604  CKTOTAL 52    HbA1C: Hgb A1c MFr Bld  Date/Time Value Ref Range  Status  12/24/2017 05:54 AM 5.1 4.8 - 5.6 % Final    Comment:    (NOTE) Pre diabetes:          5.7%-6.4% Diabetes:              >6.4% Glycemic control for   <7.0% adults with diabetes     Total critical care time: 40 minutes  Performed by: [place] care time was exclusive of separately billable procedures and treating other patients.   Critical care was necessary to treat or prevent imminent or life-threatening deterioration.   Critical care was time spent personally by me on the following activities: development of treatment plan with patient and/or surrogate as well as nursing, discussions with consultants, evaluation of patient's response to treatment, examination of patient, obtaining history from patient or surrogate, ordering and performing treatments and interventions, ordering and review of laboratory studies, ordering and review of radiographic studies, pulse oximetry and re-evaluation of patient's condition.   Jacky Kindle MD [facility] See Amion for pager If no response to pager, please call [phone] until 7pm After 7pm, Please call E-link [phone]

## 2020-08-02 NOTE — Progress Notes (Addendum)
0800 Cleaned of small maroon BM. Patient extremely made because he is NPO. States that nurse should not decide if he is NPO. Condom catheter re-applied. Very argumentative and demanding. Given 2 mg of Ativan. Patient confused. States I have the shakes because I drink a pint a day and I smoke. Multiple scabs all over extremities and torso. Patient scratching as he is talking. 0900 Patient pulled out both IV's. States he doesn't know what happen. Rude and hateful to Press photographer. IV restarted x 1 but pulled out newly restarted  IV . Arm tremors very pronounced. Started on Precedex at .8. 1030 Patient sleeping B/P low. Bolus of LR ordered per MD started. Unable to restart 2nd IV. IV team consulted. Precedex weaned to .4 slowly. Patient awakens to mouth care and speaks coherently. 1100 slowly B/P recovering with LR bolus. Patient awakens and answers questions appropriately. 1230 1st unit of PRBCs started after midline placed by IV team.

## 2020-08-02 NOTE — Anesthesia Postprocedure Evaluation (Signed)
Anesthesia Post Note  Patient: Oluwaseun Cremer  Procedure(s) Performed: ESOPHAGOGASTRODUODENOSCOPY (EGD) WITH PROPOFOL (N/A )  Patient location during evaluation: ICU Anesthesia Type: General Level of consciousness: lethargic and patient uncooperative (on precedex drip, as per preop) Pain management: pain level controlled Vital Signs Assessment: post-procedure vital signs reviewed and stable Respiratory status: spontaneous breathing, nonlabored ventilation, respiratory function stable and patient connected to nasal cannula oxygen Cardiovascular status: blood pressure returned to baseline and stable Postop Assessment: no apparent nausea or vomiting Anesthetic complications: no   No complications documented.   Last Vitals:  Vitals:   08/02/20 1547 08/02/20 1613  BP: 112/71 (!) 101/56  Pulse: 71   Resp: 20 16  Temp:    SpO2: 93% 97%    Last Pain:  Vitals:   08/02/20 1547  TempSrc:   PainSc: 0-No pain                 Corinda Gubler

## 2020-08-02 NOTE — Consult Note (Signed)
PHARMACY CONSULT NOTE - FOLLOW UP  Pharmacy Consult for Electrolyte Monitoring and Replacement   Recent Labs: Potassium (mmol/L)  Date Value  08/02/2020 4.0   Magnesium (mg/dL)  Date Value  24/46/9507 2.1   Calcium (mg/dL)  Date Value  22/57/5051 6.8 (L)   Albumin (g/dL)  Date Value  83/35/8251 1.8 (L)   Phosphorus (mg/dL)  Date Value  89/84/2103 3.4   Sodium (mmol/L)  Date Value  08/02/2020 134 (L)   Corrected Ca: 9.3  Assessment: Patient with a significant history of alcohol abuse and has right upper quadrant pain with abnormal liver enzymes. Patient with large bowel movement that was dark and tarry with gross amount of blood in it. Pt admitted to ICU. Pharmacy has been consulted to monitor and replace electrolytes.  Goal of Therapy:  Electrolytes WNL  Plan:   Na 134 but trending up, will continue to monitor  No other replacement needed at this time  Recheck electrolytes with AM labs   Raiford Noble, PharmD Pharmacy Resident  08/02/2020 6:21 AM

## 2020-08-02 NOTE — Progress Notes (Signed)
VAST consulted to obtain IV access and place midline. Upon arrival at bedside, noted patient with one PIV in left forearm infusing, one PIV in left hand. VAST RN flushed left hand PIV with saline; no blood return, but flushed easily, without swelling, blanching, or redness; redressed IV and notified ICU RN of working IV.  Able to place midline in left cephalic vein. Educated ICU RN if patient continues to need 3 different IV sites, a PICC or CL will be needed, as he has limited vasculature left to utilize. ICU RN verbalized understanding. She stated a PICC was requested and denied by physician at this time.

## 2020-08-02 NOTE — Progress Notes (Signed)
Noted moderate amount of bloody stool noted with burgundy clots noted. Pt noted to be incontinent of urine and condom cath was placed. On going assessment

## 2020-08-03 ENCOUNTER — Encounter: Payer: Self-pay | Admitting: Gastroenterology

## 2020-08-03 DIAGNOSIS — E872 Acidosis: Secondary | ICD-10-CM | POA: Diagnosis not present

## 2020-08-03 DIAGNOSIS — F10231 Alcohol dependence with withdrawal delirium: Secondary | ICD-10-CM

## 2020-08-03 DIAGNOSIS — K922 Gastrointestinal hemorrhage, unspecified: Secondary | ICD-10-CM | POA: Diagnosis not present

## 2020-08-03 LAB — BPAM RBC
Blood Product Expiration Date: 202205032359
Blood Product Expiration Date: 202205032359
Blood Product Expiration Date: 202205082359
Blood Product Expiration Date: 202205092359
ISSUE DATE / TIME: 202204131617
ISSUE DATE / TIME: 202204131617
ISSUE DATE / TIME: 202204141225
ISSUE DATE / TIME: 202204141649
Unit Type and Rh: 5100
Unit Type and Rh: 5100
Unit Type and Rh: 6200
Unit Type and Rh: 6200

## 2020-08-03 LAB — COMPREHENSIVE METABOLIC PANEL
ALT: 41 U/L (ref 0–44)
AST: 109 U/L — ABNORMAL HIGH (ref 15–41)
Albumin: 2 g/dL — ABNORMAL LOW (ref 3.5–5.0)
Alkaline Phosphatase: 141 U/L — ABNORMAL HIGH (ref 38–126)
Anion gap: 6 (ref 5–15)
BUN: 8 mg/dL (ref 8–23)
CO2: 23 mmol/L (ref 22–32)
Calcium: 7.5 mg/dL — ABNORMAL LOW (ref 8.9–10.3)
Chloride: 106 mmol/L (ref 98–111)
Creatinine, Ser: 0.63 mg/dL (ref 0.61–1.24)
GFR, Estimated: >60 mL/min (ref >60)
Glucose, Bld: 119 mg/dL — ABNORMAL HIGH (ref 70–99)
Potassium: 4.5 mmol/L (ref 3.5–5.1)
Sodium: 135 mmol/L (ref 135–145)
Total Bilirubin: 1.2 mg/dL (ref 0.3–1.2)
Total Protein: 4.2 g/dL — ABNORMAL LOW (ref 6.5–8.1)

## 2020-08-03 LAB — TYPE AND SCREEN
ABO/RH(D): A POS
Antibody Screen: NEGATIVE
Unit division: 0
Unit division: 0
Unit division: 0
Unit division: 0

## 2020-08-03 LAB — PHOSPHORUS: Phosphorus: 4.6 mg/dL (ref 2.5–4.6)

## 2020-08-03 LAB — HEMOGLOBIN AND HEMATOCRIT, BLOOD
HCT: 31.3 % — ABNORMAL LOW (ref 39.0–52.0)
HCT: 34.8 % — ABNORMAL LOW (ref 39.0–52.0)
HCT: 33.1 % — ABNORMAL LOW (ref 39.0–52.0)
Hemoglobin: 11 g/dL — ABNORMAL LOW (ref 13.0–17.0)
Hemoglobin: 11.8 g/dL — ABNORMAL LOW (ref 13.0–17.0)
Hemoglobin: 11.8 g/dL — ABNORMAL LOW (ref 13.0–17.0)

## 2020-08-03 LAB — TROPONIN I (HIGH SENSITIVITY): Troponin I (High Sensitivity): 16 ng/L (ref <18)

## 2020-08-03 LAB — MAGNESIUM: Magnesium: 2.1 mg/dL (ref 1.7–2.4)

## 2020-08-03 LAB — LACTIC ACID, PLASMA: Lactic Acid, Venous: 1.4 mmol/L (ref 0.5–1.9)

## 2020-08-03 MED ORDER — LORAZEPAM 2 MG/ML IJ SOLN
1.0000 mg | INTRAMUSCULAR | Status: DC | PRN
  Administered 2020-08-03 – 2020-08-04 (×3): 2 mg via INTRAVENOUS
  Filled 2020-08-03 (×2): qty 1

## 2020-08-03 MED ORDER — DIAZEPAM 2 MG PO TABS
5.0000 mg | ORAL_TABLET | Freq: Four times a day (QID) | ORAL | Status: DC
  Administered 2020-08-03: 5 mg via ORAL
  Filled 2020-08-03: qty 3

## 2020-08-03 MED ORDER — LORAZEPAM 2 MG/ML IJ SOLN: 0.0000 mg | Freq: Three times a day (TID) | INTRAMUSCULAR | Status: DC

## 2020-08-03 MED ORDER — LORAZEPAM 1 MG PO TABS: 1.0000 mg | ORAL_TABLET | ORAL | Status: DC | PRN

## 2020-08-03 MED ORDER — LORAZEPAM 2 MG/ML IJ SOLN
0.0000 mg | INTRAMUSCULAR | Status: DC
  Administered 2020-08-03: 3 mg via INTRAVENOUS
  Administered 2020-08-03 – 2020-08-04 (×3): 2 mg via INTRAVENOUS
  Filled 2020-08-03 (×3): qty 1
  Filled 2020-08-03: qty 2
  Filled 2020-08-03: qty 1

## 2020-08-03 NOTE — Progress Notes (Signed)
Chad Minium, MD [facility]   [address], [address] [place], [place] [address] Phone: [phone] Fax : [phone]   Subjective: The patient has a stable Hb and no further signs of bleeding. The EGD showed ulcers that were not bleeding. Patient reporting leg pain   Objective: Vital signs in last 24 hours: Vitals:   08/03/20 0400 08/03/20 0500 08/03/20 0600 08/03/20 0745  BP: 124/77 128/79 123/74 119/81  Pulse: (!) 51 (!) 52 (!) 51 (!) 53  Resp: 12 11 11 20   Temp: 97.9 F (36.6 C)     TempSrc: Axillary     SpO2: 99% 98% 97% 99%  Weight:  76 kg    Height:       Weight change: 2.1 kg  Intake/Output Summary (Last 24 hours) at 08/03/2020 1046 Last data filed at 08/03/2020 0600 Gross per 24 hour  Intake 2439.6 ml  Output 800 ml  Net 1639.6 ml     Exam: Heart:: Regular rate and rhythm, S1S2 present or without murmur or extra heart sounds Lungs: normal and clear to auscultation and percussion Abdomen: soft, nontender, normal bowel sounds   Lab Results: @LABTEST2 @ Micro Results: Recent Results (from the past 240 hour(s))  Resp Panel by RT-PCR (Flu A&B, Covid) Nasopharyngeal Swab     Status: None   Collection Time: 08/01/20  1:43 PM   Specimen: Nasopharyngeal Swab; Nasopharyngeal(NP) swabs in vial transport medium  Result Value Ref Range Status   SARS Coronavirus 2 by RT PCR NEGATIVE NEGATIVE Final    Comment: (NOTE) SARS-CoV-2 target nucleic acids are NOT DETECTED.  The SARS-CoV-2 RNA is generally detectable in upper respiratory specimens during the acute phase of infection. The lowest concentration of SARS-CoV-2 viral copies this assay can detect is 138 copies/mL. A negative result does not preclude SARS-Cov-2 infection and should not be used as the sole basis for treatment or other patient management decisions. A negative result may occur with  improper specimen collection/handling, submission of specimen other than nasopharyngeal swab, presence of viral mutation(s)  within the areas targeted by this assay, and inadequate number of viral copies(<138 copies/mL). A negative result must be combined with clinical observations, patient history, and epidemiological information. The expected result is Negative.  Fact Sheet for Patients:   Fact Sheet for Healthcare Providers:  08/03/20  This test is no t yet approved or cleared by the [employer] FDA and  has been authorized for detection and/or diagnosis of SARS-CoV-2 by FDA under an Emergency Use Authorization (EUA). This EUA will remain  in effect (meaning this test can be used) for the duration of the COVID-19 declaration under Section 564(b)(1) of the Act, 21 U.S.C.section 360bbb-3(b)(1), unless the authorization is terminated  or revoked sooner.       Influenza A by PCR NEGATIVE NEGATIVE Final   Influenza B by PCR NEGATIVE NEGATIVE Final    Comment: (NOTE) The Xpert Xpress SARS-CoV-2/FLU/RSV plus assay is intended as an aid in the diagnosis of influenza from Nasopharyngeal swab specimens and should not be used as a sole basis for treatment. Nasal washings and aspirates are unacceptable for Xpert Xpress SARS-CoV-2/FLU/RSV testing.  Fact Sheet for Patients: SeriousBroker.it  Fact Sheet for Healthcare Providers: [place]  This test is not yet approved or cleared by the [employer] FDA and has been authorized for detection and/or diagnosis of SARS-CoV-2 by FDA under an Emergency Use Authorization (EUA). This EUA will remain in effect (meaning this test can be used) for the duration of the COVID-19 declaration  under Section 564(b)(1) of the Act, 21 U.S.C. section 360bbb-3(b)(1), unless the authorization is terminated or revoked.  Performed at [facility], [address] [place], [place] [id number]    Studies/Results: CT HEAD WO  CONTRAST  Result Date: 08/01/2020 CLINICAL DATA:  Mental status changes, frequent falls. EXAM: CT HEAD WITHOUT CONTRAST TECHNIQUE: Contiguous axial images were obtained from the base of the skull through the vertex without intravenous contrast. COMPARISON:  CT head 01/14/2020 FINDINGS: Brain: The brainstem, cerebellum, cerebral peduncles, thalami, basal ganglia, basilar cisterns, and ventricular system appear within normal limits. Periventricular white matter and corona radiata hypodensities favor chronic ischemic microvascular white matter disease. No intracranial hemorrhage, mass lesion, or acute CVA. Vascular: Unremarkable Skull: Unremarkable Sinuses/Orbits: Chronic bilateral maxillary sinusitis. Other: No supplemental non-categorized findings. IMPRESSION: 1. No acute intracranial findings. 2. Periventricular white matter and corona radiata hypodensities favor chronic ischemic microvascular white matter disease. 3. Mild chronic bilateral maxillary sinusitis. Electronically Signed   By: Gaylyn RongWalter  Liebkemann M.D.   On: 08/01/2020 18:40   CT ABDOMEN PELVIS W CONTRAST  Result Date: 08/01/2020 CLINICAL DATA:  Abdominal distension and epigastric pain. Chest pain for 6-8 weeks. Reported alcohol use with multiple drinks daily. EXAM: CT ABDOMEN AND PELVIS WITH CONTRAST TECHNIQUE: Multidetector CT imaging of the abdomen and pelvis was performed using the standard protocol following bolus administration of intravenous contrast. CONTRAST:  100mL OMNIPAQUE IOHEXOL 300 MG/ML  SOLN COMPARISON:  05/01/2020 FINDINGS: Lower chest: No acute findings. Hepatobiliary: Liver demonstrates diffusely decreased attenuation consistent with fatty infiltration. No mass or focal lesion. Status post cholecystectomy. No bile duct dilation. Pancreas: Unremarkable. No pancreatic ductal dilatation or surrounding inflammatory changes. Spleen: Normal in size without focal abnormality. Adrenals/Urinary Tract: Adrenal glands are unremarkable.  Kidneys are normal, without renal calculi, focal lesion, or hydronephrosis. Bladder is unremarkable. Stomach/Bowel: Normal stomach. Small bowel and colon are normal in caliber. No wall thickening. No inflammation. Numerous left colon diverticula mostly along the sigmoid. No diverticulitis. No evidence of appendicitis. Vascular/Lymphatic: Several prominent gastrohepatic ligament lymph nodes, largest 1.1 cm in short axis. No other lymphadenopathy. Aortic atherosclerosis. No aneurysm. Reproductive: Unremarkable. Other: No abdominal wall hernia or abnormality. No abdominopelvic ascites. Musculoskeletal: ORIF of a previous left proximal femur fracture. Previous L4-L5 posterior lumbar spine fusion. No fracture or acute finding. No bone lesion. IMPRESSION: 1. No acute findings. 2. Extensive hepatic steatosis.  No liver masses. 3. Left colon diverticulosis.  No evidence of diverticulitis. 4. Aortic atherosclerosis. Electronically Signed   By: Amie Portlandavid  Ormond M.D.   On: 08/01/2020 14:30   DG Chest Portable 1 View  Result Date: 08/01/2020 CLINICAL DATA:  70 year old male with chest pain.  Weakness. EXAM: PORTABLE CHEST 1 VIEW COMPARISON:  Chest CTA 02/04/2020 and earlier. FINDINGS: Portable AP semi upright view at 1121 hours. Mild lordotic positioning. Lung volumes and mediastinal contours are stable and within normal limits. Allowing for portable technique the lungs are clear. No pneumothorax or pleural effusion. No acute osseous abnormality identified. Negative visible bowel gas pattern. IMPRESSION: Negative portable chest. Electronically Signed   By: Odessa FlemingH  Hall M.D.   On: 08/01/2020 11:59   CT Angio Abd/Pel w/ and/or w/o  Result Date: 08/01/2020 CLINICAL DATA:  Gastrointestinal bleeding. EXAM: CTA ABDOMEN AND PELVIS WITHOUT AND WITH CONTRAST TECHNIQUE: Multidetector CT imaging of the abdomen and pelvis was performed using the standard protocol during bolus administration of intravenous contrast. Multiplanar  reconstructed images and MIPs were obtained and reviewed to evaluate the vascular anatomy. CONTRAST:  100mL OMNIPAQUE IOHEXOL 350  MG/ML SOLN COMPARISON:  September 29, 2020. FINDINGS: VASCULAR Aorta: Atherosclerosis of abdominal aorta is noted without aneurysm or dissection. Celiac: Patent without evidence of aneurysm, dissection, vasculitis or significant stenosis. SMA: Patent without evidence of aneurysm, dissection, vasculitis or significant stenosis. Renals: Both renal arteries are patent without evidence of aneurysm, dissection, vasculitis, fibromuscular dysplasia or significant stenosis. IMA: Patent without evidence of aneurysm, dissection, vasculitis or significant stenosis. Inflow: Patent without evidence of aneurysm, dissection, vasculitis or significant stenosis. Proximal Outflow: Bilateral common femoral and visualized portions of the superficial and profunda femoral arteries are patent without evidence of aneurysm, dissection, vasculitis or significant stenosis. Veins: No obvious venous abnormality within the limitations of this arterial phase study. Review of the MIP images confirms the above findings. NON-VASCULAR Lower chest: No acute abnormality. Hepatobiliary: Hepatic steatosis. Status post cholecystectomy. No biliary dilatation is noted. Pancreas: Unremarkable. No pancreatic ductal dilatation or surrounding inflammatory changes. Spleen: Normal in size without focal abnormality. Adrenals/Urinary Tract: Adrenal glands are unremarkable. Kidneys are normal, without renal calculi, focal lesion, or hydronephrosis. Bladder is unremarkable. Stomach/Bowel: The stomach appears normal. There is no evidence of bowel obstruction or inflammation. The appendix is not visualized. Sigmoid diverticulosis is noted without acute inflammation. There is no definite evidence of contrast extravasation involving the bowel. Lymphatic: No adenopathy is noted. Reproductive: Prostate is unremarkable. Other: No abdominal wall hernia  or abnormality. No abdominopelvic ascites. Musculoskeletal: No fracture is seen. IMPRESSION: VASCULAR Atherosclerosis of abdominal aorta without aneurysm or dissection. No evidence of significant mesenteric or renal artery stenosis. No definite evidence of contrast extravasation to suggest active gastrointestinal hemorrhage. NON-VASCULAR Sigmoid diverticulosis without inflammation. Hepatic steatosis. Electronically Signed   By: Lupita Raider M.D.   On: 08/01/2020 19:01   US Abdomen Limited RUQ (LIVER/GB)  Result Date: 08/01/2020 CLINICAL DATA:  Right upper quadrant pain for 6-8 weeks, previous cholecystectomy EXAM: ULTRASOUND ABDOMEN LIMITED RIGHT UPPER QUADRANT COMPARISON:  08/01/2020 FINDINGS: Gallbladder: Surgically absent Common bile duct: Diameter: 3 mm Liver: Diffuse increased liver echotexture consistent with hepatic steatosis. No focal abnormality or intrahepatic duct dilation. Portal vein is patent on color Doppler imaging with normal direction of blood flow towards the liver. Other: None. IMPRESSION: 1. Hepatic steatosis. 2. Otherwise unremarkable exam in a patient status post cholecystectomy. Electronically Signed   By: Sharlet Salina M.D.   On: 08/01/2020 17:27   Medications: I have reviewed the patient's current medications. Scheduled Meds: . folic acid  1 mg Intravenous Daily  . LORazepam  0-4 mg Intravenous Q6H   Or  . LORazepam  0-4 mg Oral Q6H  . LORazepam  0-4 mg Intravenous Q12H   Or  . LORazepam  0-4 mg Oral Q12H  . pantoprazole (PROTONIX) IV  40 mg Intravenous Q12H  . [START ON 08/05/2020] thiamine injection  100 mg Intravenous Daily   Continuous Infusions: . sodium chloride    . dexmedetomidine (PRECEDEX) IV infusion Stopped (08/03/20 0915)  . thiamine injection 500 mg (08/02/20 1130)   PRN Meds:.docusate sodium, ondansetron (ZOFRAN) IV, polyethylene glycol   Assessment: Active Problems:   RUQ pain   Acute GI bleeding   Blood in stool   Duodenal ulcer without  hemorrhage or perforation    Plan: GI bleed that has stopped. Patient told to stop Etoh. Nothing further to do from a G point of view with his Hb now being stable.  I will sign off.  Please call if any further GI concerns or questions.  We would like to thank you for the  opportunity to participate in the care of Chad [name].     LOS: 2 days   Sherlyn Hay 08/03/2020, 10:46 AM Pager [phone] 7am-5pm  Check AMION for 5pm -7am coverage and on weekends

## 2020-08-03 NOTE — Progress Notes (Addendum)
NAME:  Chad [name], MRN:  [id number], DOB:  01/29/1951, LOS: 2 ADMISSION DATE:  08/01/2020, CONSULTATION DATE: 08/01/2020 REFERRING MD: Dolan Amen, CHIEF COMPLAINT: Chest Pain   History of Present Illness:  This is a 70 yo male who presented to [facility] ER on 04/13 via EMS with c/o intermittent chest pain onset 6-8 weeks prior to presentation.  He also endorsed a 3 week hx of weakness along with frequent falls, and thinks he hit his head during one of the falls.  He is a daily drinker with his last drink the morning of 04/13.  When asked how much he drinks he stated "I don't know" although he told ER triage nurse he drinks a pint a day.  Pt recently admitted from 01/12-01/13 with acute GI bleed, however he did not have any active bleeding while hospitalized.  He underwent an EGD on 01/13 which revealed the duodenum was normal; a large hiatal hernia; and tortuous esophagus.  GI recommended at that time if further bleeding occurred would need colonoscopy.  ED course  Pt endorsed RUQ pain, therefore CT Abd Pelvis ordered which revealed no acute findings, but extensive hepatic steatosis.  Initial ER labs revealed glucose 102, BUN 6, anion gap 17, alk phos 229, albumin 2.7, AST 205, ALT 65, total bilirubin 2.1, BNP 184.4, lactic acid 4.6, hgb 12.2, hct 35.5, platelets 85, PT 12.8, and INR 1.0. However, pt later became pale and had a large tarry stool with gross amounts of blood.  Pt subsequently became tachycardic and hypotensive bp 75/53.  He received aggressive fluid resuscitation with sbp increasing to the 90's.  Repeat hgb 8.5/hct 26.2, 2 units of pRBC's ordered by ER physician.  Pt denies any active bleeding at home, NSAID use, or anticoagulation medication use. Although he is slightly confused and a poor historian.  PCCM team contacted for ICU admission due to decline in clinical status.    Pertinent  Medical History  Pneumonia  HTN Gallstones Headache  GERD  Degenerative Disc Disease  CKD   Anxiety  ETOH Abuse Cirrhosis  Barrett's Esophagus   Significant Hospital Events: Including procedures, antibiotic start and stop dates in addition to other pertinent events   08/02/20: Pt admitted to ICU with hemorrhagic shock secondary to  Acute GI Bleed 08/02/20: GI Consulted 08/02/20: s/p Endoscopy  Interim History / Subjective:  No reports of further upper or lower GI bleed. Hgb stable 11.0  Objective   Blood pressure 119/81, pulse (!) 53, temperature 97.9 F (36.6 C), temperature source Axillary, resp. rate 20, height _0  (1.727 m), weight 76 kg, SpO2 99 %.        Intake/Output Summary (Last 24 hours) at 08/03/2020 0843 Last data filed at 08/03/2020 0600 Gross per 24 hour  Intake 2439.6 ml  Output 800 ml  Net 1639.6 ml   Filed Weights   08/01/20 1106 08/02/20 0500 08/03/20 0500  Weight: 73.9 kg 72.9 kg 76 kg    Examination: GENERAL: 70 year old patient lying in the bed with no acute distress.  EYES: Pupils equal, round, reactive to light and accommodation. No scleral icterus. Extraocular muscles intact.  HEENT: Head atraumatic, normocephalic. Oropharynx and nasopharynx clear.  NECK:  Supple, no jugular venous distention. No thyroid enlargement, no tenderness.  LUNGS: Normal breath sounds bilaterally, no wheezing, rales,rhonchi or crepitation. No use of accessory muscles of respiration.  CARDIOVASCULAR: S1, S2 normal. No murmurs, rubs, or gallops.  ABDOMEN: Soft, nontender, nondistended. Bowel sounds present. No organomegaly or mass.  EXTREMITIES: No pedal  edema, cyanosis, or clubbing.  NEUROLOGIC: Cranial nerves II through XII are intact. Except speech is mildly garbled but comprehensible. Muscle strength 5/5 in all extremities. Sensation intact. Gait not checked.  PSYCHIATRIC: The patient is alert and oriented x 1. SKIN: No obvious rash, lesion, or ulcer.    Labs/imaging that I havepersonally reviewed  (right click and "Reselect all SmartList Selections" daily)   04/13~11:27am: Glucose 102, BUN 6, anion gap 17, alk phos 229, albumin 2.7, AST 205, ALT 65, total bilirubin 2.1, BNP 184.4, lactic acid 4.6, hgb 12.2, hct 35.5, platelets 85, PT 12.8, and INR 1.0 04/13~1604: hgb 8.5, hct 26.2, lactic acid 8.3, platelets 85 04/13: EKG sinus tach hr 105, no acute ischemic changes  04/13: CT Abd Pelvis revealed no acute findings. Extensive hepatic steatosis.  No liver masses. Left colon diverticulosis.  No evidence of diverticulitis. Aortic atherosclerosis.  Resolved Hospital Problem list   Hemorrhagic shock Elevated troponin Hyponatremia and hypomagnesia Lactic acidosis  Assessment & Plan:   Acute upper GI bleeding  Acute blood loss anemia S/p Endoscopy 4/14 showing non-bleeding gastric and duodenal  ulcer with no bleeding. Duodenitis. Hx: Barrett's esophagus  -IVF resuscitation to maintain MAP>65 -H&H monitoring q6h -Transfuse PRN Hgb<8 -Pantoprazole 47m IV BID -Hold NSAIDs, steroids, ASA -Start Liquid diet advance as tolerated -(SBPeritonitis) Antibiotic Prophylaxis with ceftriaxone 1g IV daily -GI Consult, appreciate input  Transaminitis secondary to hepatic steatosis and hemorrhagic shock , now improving EtOH Hepatitis? -Trend hepatic panel and alk phos  -Hepatitis C antibody result pending    ETOH abuse; high risk for alcohol withdrawal  -Continue Precedex gtt wean as tolerated, will start Valium while weaning off Precedex -Check Volatile Screen (EtOH, Osmol Gap), UTox -Monitor CMP, INR, Daily BMP+Mg -Daily Thiamine, Folate, MVI once tolerating PO -SW consult for cessation resources -PT/OT evaluation for mobility -CIWA +/- Standing Protocol    Frequent falls -CT head negative for acute intracranial abnormality -Fall precautions    Best practice (right click and "Reselect all SmartList Selections" daily)  Diet:  NPO Pain/Anxiety/Delirium protocol (if indicated): Yes (RASS goal 0) VAP protocol (if indicated): Not indicated DVT  prophylaxis: Contraindicated GI prophylaxis: PPI Glucose control:  SSI No Central venous access:  N/A Arterial line:  N/A Foley:  N/A Mobility:  bed rest  PT consulted: N/A Last date of multidisciplinary goals of care discussion [N/A] Code Status:  full code Disposition: ICU  Labs   CBC: Recent Labs  Lab 08/01/20 1127 08/01/20 1604 08/01/20 2001 08/02/20 0311 08/02/20 0846 08/02/20 2022 08/03/20 0253  WBC 4.0 5.2  --   --   --   --   --   NEUTROABS 2.6  --   --   --   --   --   --   HGB 12.2* 8.5* 9.1* 8.0* 8.4* 11.3* 11.0*  HCT 35.5* 26.2* 26.6* 23.6* 24.8* 32.5* 31.3*  MCV 95.7 100.4*  --   --   --   --   --   PLT 85* 85*  --   --   --   --   --     Basic Metabolic Panel: Recent Labs  Lab 08/01/20 1127 08/01/20 1343 08/02/20 0311 08/03/20 0253  NA 131*  --  134* 135  K 3.7  --  4.0 4.5  CL 92*  --  102 106  CO2 22  --  25 23  GLUCOSE 102*  --  151* 119*  BUN 6*  --  8 8  CREATININE 0.75  --  0.74 0.63  CALCIUM 8.3*  --  6.8* 7.5*  MG  --  1.6* 2.1 2.1  PHOS  --  3.1 3.4 4.6   GFR: Estimated Creatinine Clearance: 86.7 mL/min (by C-G formula based on SCr of 0.63 mg/dL). Recent Labs  Lab 08/01/20 1123 08/01/20 1127 08/01/20 1343 08/01/20 1604 08/01/20 1606 08/01/20 2001  WBC  --  4.0  --  5.2  --   --   LATICACIDVEN 4.6*  --  4.1*  --  8.3* 3.3*    Liver Function Tests: Recent Labs  Lab 08/01/20 1127 08/02/20 0311 08/03/20 0253  AST 205* 82* 109*  ALT 65* 35 41  ALKPHOS 229* 120 141*  BILITOT 2.1* 0.9 1.2  PROT 5.6* 3.5* 4.2*  ALBUMIN 2.7* 1.8* 2.0*   Recent Labs  Lab 08/01/20 1127  LIPASE 25   No results for input(s): AMMONIA in the last 168 hours.  ABG    Component Value Date/Time   HCO3 25.4 08/01/2020 1343   O2SAT 68.0 08/01/2020 1343     Coagulation Profile: Recent Labs  Lab 08/01/20 1127  INR 1.0    Cardiac Enzymes: Recent Labs  Lab 08/01/20 1604  CKTOTAL 52    HbA1C: Hgb A1c MFr Bld  Date/Time Value Ref  Range Status  12/24/2017 05:54 AM 5.1 4.8 - 5.6 % Final    Comment:    (NOTE) Pre diabetes:          5.7%-6.4% Diabetes:              >6.4% Glycemic control for   <7.0% adults with diabetes     CBG: No results for input(s): GLUCAP in the last 168 hours.   Allergies Allergies  Allergen Reactions  . Morphine And Related Other (See Comments)    Causes "bad disposition"  . Ibuprofen Other (See Comments)    Gi upset     Home Medications  Prior to Admission medications   Medication Sig Start Date End Date Taking? Authorizing Provider  acetaminophen (TYLENOL) 325 MG tablet Take 2 tablets (650 mg total) by mouth every 6 (six) hours as needed for mild pain (pain score 1-3 or temp > 100.5). 01/19/20   Loletha Grayer, MD  feeding supplement, ENSURE ENLIVE, (ENSURE ENLIVE) LIQD Take 237 mLs by mouth 2 (two) times daily between meals. 12/12/19   Loletha Grayer, MD  folic acid (FOLVITE) 1 MG tablet Take 1 tablet (1 mg total) by mouth daily. Patient not taking: Reported on 02/04/2020 01/07/20   Jennye Boroughs, MD  hydrOXYzine (VISTARIL) 25 MG capsule Take 1 capsule (25 mg total) by mouth 3 (three) times daily as needed for anxiety or itching. 12/12/19   Loletha Grayer, MD  nicotine (NICODERM CQ - DOSED IN MG/24 HOURS) 21 mg/24hr patch One 38m patch chest wall daily (okay to substitute generic) 12/12/19   WLoletha Grayer MD  pantoprazole (PROTONIX) 40 MG tablet Take 1 tablet (40 mg total) by mouth 2 (two) times daily. 05/03/20 05/03/21  ALorella Nimrod MD  sertraline (ZOLOFT) 50 MG tablet Take 3 tablets (150 mg total) by mouth daily. 02/11/20   PFritzi Mandes MD  thiamine 100 MG tablet Take 1 tablet (100 mg total) by mouth daily. 12/12/19   WLoletha Grayer MD  traZODone (DESYREL) 100 MG tablet Take 1 tablet (100 mg total) by mouth at bedtime. 12/12/19   WLoletha Grayer MD    Scheduled Meds: . folic acid  1 mg Intravenous Daily  . LORazepam  0-4 mg Intravenous Q6H  Or  . LORazepam  0-4 mg  Oral Q6H  . LORazepam  0-4 mg Intravenous Q12H   Or  . LORazepam  0-4 mg Oral Q12H  . pantoprazole (PROTONIX) IV  40 mg Intravenous Q12H  . [START ON 08/05/2020] thiamine injection  100 mg Intravenous Daily   Continuous Infusions: . sodium chloride    . cefTRIAXone (ROCEPHIN)  IV Stopped (08/02/20 1834)  . dexmedetomidine (PRECEDEX) IV infusion 0.6 mcg/kg/hr (08/03/20 0600)  . thiamine injection 500 mg (08/02/20 1130)   PRN Meds:.docusate sodium, ondansetron (ZOFRAN) IV, polyethylene glycol    Rufina Falco, DNP, CCRN, FNP-C, AGACNP-BC Acute Care Nurse Practitioner  [facility] Pager: [phone] [facility]

## 2020-08-03 NOTE — Consult Note (Signed)
PHARMACY CONSULT NOTE - FOLLOW UP  Pharmacy Consult for Electrolyte Monitoring and Replacement   Recent Labs: Potassium (mmol/L)  Date Value  08/03/2020 4.5   Magnesium (mg/dL)  Date Value  27/61/4709 2.1   Calcium (mg/dL)  Date Value  29/57/4734 7.5 (L)   Albumin (g/dL)  Date Value  03/70/9643 2.0 (L)   Phosphorus (mg/dL)  Date Value  83/81/8403 4.6   Sodium (mmol/L)  Date Value  08/03/2020 135   Corrected Ca: 9.3  Assessment: Patient with a significant history of alcohol abuse and has right upper quadrant pain with abnormal liver enzymes. Patient with large bowel movement that was dark and tarry with gross amount of blood in it. Pt admitted to ICU. Pharmacy has been consulted to monitor and replace electrolytes.  Goal of Therapy:  Electrolytes WNL  Plan:   Electrolytes WNL, no replacement needed at this time  Recheck electrolytes with AM labs   Raiford Noble, PharmD Pharmacy Resident  08/03/2020 6:33 AM

## 2020-08-04 LAB — COMPREHENSIVE METABOLIC PANEL
ALT: 42 U/L (ref 0–44)
AST: 88 U/L — ABNORMAL HIGH (ref 15–41)
Albumin: 2.1 g/dL — ABNORMAL LOW (ref 3.5–5.0)
Alkaline Phosphatase: 133 U/L — ABNORMAL HIGH (ref 38–126)
Anion gap: 8 (ref 5–15)
BUN: 9 mg/dL (ref 8–23)
CO2: 23 mmol/L (ref 22–32)
Calcium: 7.6 mg/dL — ABNORMAL LOW (ref 8.9–10.3)
Chloride: 103 mmol/L (ref 98–111)
Creatinine, Ser: 0.74 mg/dL (ref 0.61–1.24)
GFR, Estimated: >60 mL/min (ref >60)
Glucose, Bld: 107 mg/dL — ABNORMAL HIGH (ref 70–99)
Potassium: 3.9 mmol/L (ref 3.5–5.1)
Sodium: 134 mmol/L — ABNORMAL LOW (ref 135–145)
Total Bilirubin: 0.9 mg/dL (ref 0.3–1.2)
Total Protein: 4.5 g/dL — ABNORMAL LOW (ref 6.5–8.1)

## 2020-08-04 LAB — BASIC METABOLIC PANEL
Anion gap: 7 (ref 5–15)
BUN: 9 mg/dL (ref 8–23)
CO2: 24 mmol/L (ref 22–32)
Calcium: 7.5 mg/dL — ABNORMAL LOW (ref 8.9–10.3)
Chloride: 102 mmol/L (ref 98–111)
Creatinine, Ser: 0.87 mg/dL (ref 0.61–1.24)
GFR, Estimated: >60 mL/min (ref >60)
Glucose, Bld: 96 mg/dL (ref 70–99)
Potassium: 4.1 mmol/L (ref 3.5–5.1)
Sodium: 133 mmol/L — ABNORMAL LOW (ref 135–145)

## 2020-08-04 LAB — CBC
HCT: 31.1 % — ABNORMAL LOW (ref 39.0–52.0)
Hemoglobin: 10.4 g/dL — ABNORMAL LOW (ref 13.0–17.0)
MCH: 31.7 pg (ref 26.0–34.0)
MCHC: 33.4 g/dL (ref 30.0–36.0)
MCV: 94.8 fL (ref 80.0–100.0)
Platelets: 89 10*3/uL — ABNORMAL LOW (ref 150–400)
RBC: 3.28 MIL/uL — ABNORMAL LOW (ref 4.22–5.81)
RDW: 17.3 % — ABNORMAL HIGH (ref 11.5–15.5)
WBC: 5.3 10*3/uL (ref 4.0–10.5)
nRBC: 0.4 % — ABNORMAL HIGH (ref 0.0–0.2)

## 2020-08-04 LAB — TSH: TSH: 4.262 u[IU]/mL (ref 0.350–4.500)

## 2020-08-04 LAB — PHOSPHORUS
Phosphorus: 3.5 mg/dL (ref 2.5–4.6)
Phosphorus: 3.8 mg/dL (ref 2.5–4.6)

## 2020-08-04 LAB — MAGNESIUM
Magnesium: 1.9 mg/dL (ref 1.7–2.4)
Magnesium: 1.7 mg/dL (ref 1.7–2.4)

## 2020-08-04 LAB — HEMOGLOBIN AND HEMATOCRIT, BLOOD
HCT: 32.8 % — ABNORMAL LOW (ref 39.0–52.0)
Hemoglobin: 10.9 g/dL — ABNORMAL LOW (ref 13.0–17.0)

## 2020-08-04 LAB — VITAMIN B12: Vitamin B-12: 1068 pg/mL — ABNORMAL HIGH (ref 180–914)

## 2020-08-04 MED ORDER — PANTOPRAZOLE SODIUM 40 MG PO TBEC
40.0000 mg | DELAYED_RELEASE_TABLET | Freq: Every day | ORAL | Status: DC
  Administered 2020-08-05 – 2020-08-14 (×10): 40 mg via ORAL
  Filled 2020-08-04 (×10): qty 1

## 2020-08-04 MED ORDER — THIAMINE HCL 100 MG PO TABS
100.0000 mg | ORAL_TABLET | Freq: Every day | ORAL | Status: DC
  Administered 2020-08-04 – 2020-08-14 (×11): 100 mg via ORAL
  Filled 2020-08-04 (×11): qty 1

## 2020-08-04 MED ORDER — LORAZEPAM 1 MG PO TABS
1.0000 mg | ORAL_TABLET | ORAL | Status: DC | PRN
  Administered 2020-08-04: 3 mg via ORAL
  Administered 2020-08-04: 2 mg via ORAL
  Administered 2020-08-06 (×2): 3 mg via ORAL
  Filled 2020-08-04: qty 2
  Filled 2020-08-04 (×3): qty 3

## 2020-08-04 MED ORDER — MAGNESIUM SULFATE 2 GM/50ML IV SOLN
2.0000 g | Freq: Once | INTRAVENOUS | Status: AC
  Administered 2020-08-04: 2 g via INTRAVENOUS
  Filled 2020-08-04: qty 50

## 2020-08-04 MED ORDER — TRAZODONE HCL 50 MG PO TABS
25.0000 mg | ORAL_TABLET | Freq: Every evening | ORAL | Status: DC | PRN
  Administered 2020-08-04 – 2020-08-13 (×7): 25 mg via ORAL
  Filled 2020-08-04 (×7): qty 1

## 2020-08-04 MED ORDER — LORAZEPAM 2 MG/ML IJ SOLN
1.0000 mg | INTRAMUSCULAR | Status: DC | PRN
Start: 2020-08-04 — End: 2020-08-06
  Administered 2020-08-04: 3 mg via INTRAVENOUS
  Administered 2020-08-04: 2 mg via INTRAVENOUS
  Administered 2020-08-04 – 2020-08-05 (×5): 3 mg via INTRAVENOUS
  Administered 2020-08-06: 1 mg via INTRAVENOUS
  Filled 2020-08-04 (×6): qty 2
  Filled 2020-08-04 (×3): qty 1

## 2020-08-04 MED ORDER — FOLIC ACID 1 MG PO TABS
1.0000 mg | ORAL_TABLET | Freq: Every day | ORAL | Status: DC
  Administered 2020-08-05 – 2020-08-14 (×10): 1 mg via ORAL
  Filled 2020-08-04 (×10): qty 1

## 2020-08-04 MED ORDER — DIAZEPAM 2 MG PO TABS
5.0000 mg | ORAL_TABLET | Freq: Two times a day (BID) | ORAL | Status: DC
  Administered 2020-08-04 (×2): 5 mg via ORAL
  Filled 2020-08-04 (×3): qty 3

## 2020-08-04 NOTE — Progress Notes (Addendum)
PROGRESS NOTE    Chad [name]  EVO:[id number] DOB: [birth year] DOA: 08/01/2020 PCP: Rayetta Humphrey, MD  Brief Narrative: 69/M w history of alcoholism, chronic back pain, liver cirrhosis, Barrett's esophagus, peptic ulcer disease, pneumonia presented to the ED on 4/13 with chest pain, subsequently he was poorly responsive with melena, acute blood loss anemia, hypotension and tachycardia. -He was admitted to the ICU with metabolic encephalopathy, hypotension, acute blood loss anemia.  CT abdomen noted extensive hepatic steatosis -Stabilized with fluid resuscitation and blood transfusions -Underwent an endoscopy 4/14 which showed gastric and duodenal ulcers which were nonbleeding -Subsequently transferred to [facility] service today 4/16    Assessment & Plan:   Hemorrhagic shock Gastric and duodenal ulcers -Likely secondary to alcoholism, thankfully does not have varices -Endoscopy 4/14 noted nonbleeding gastric and duodenal ulcers -Continue PPI changed to p.o. -Treated with fluid resuscitation and transfused 2 units of PRBC this admission -Hemoglobin is stable now, continue to trend -Transfer out of ICU -PT OT, ambulate as tolerated  Alcoholic liver disease Severe hypoalbuminemia Severe hepatic steatosis noted on CT, could have cirrhosis as well considering the degree of hypoalbuminemia  Heavy alcohol abuse -Reportedly drinks about 1 pint daily -With significant tremors concerning for withdrawal -Add low-dose Valium, continue Ativan per CIWA protocol -Continue thiamine  Hyponatremia Hypomagnesemia -Replace and monitor  Thrombocytopenia -Chronic, likely secondary to alcoholism and cirrhosis  DVT prophylaxis: SCDs Code Status: Full code Family Communication: No family at bedside Disposition Plan:  Status is: Inpatient  Remains inpatient appropriate because:Inpatient level of care appropriate due to severity of illness   Dispo: The patient is from: Home               Anticipated d/c is to: SNF              Patient currently is not medically stable to d/c.   Difficult to place patient No  Consultants:   Gastroenterology  PCCM transfer   Procedures: EGD 4/14  Antimicrobials:    Subjective: -Having shakes and tremors, asking for pain medicine  Objective: Vitals:   08/03/20 1900 08/03/20 2000 08/04/20 0000 08/04/20 0400  BP: 99/83 140/76 114/71 112/74  Pulse:  86 88 85  Resp:  20 (!) 25 (!) 26  Temp:  97.6 F (36.4 C) 97.8 F (36.6 C)   TempSrc:  Oral Axillary   SpO2:  92% 98% 97%  Weight:      Height:        Intake/Output Summary (Last 24 hours) at 08/04/2020 1028 Last data filed at 08/04/2020 0830 Gross per 24 hour  Intake --  Output 2550 ml  Net -2550 ml   Filed Weights   08/01/20 1106 08/02/20 0500 08/03/20 0500  Weight: 73.9 kg 72.9 kg 76 kg    Examination:  General exam: Chronically ill disheveled male sitting up in bed, awake alert oriented to self and place Respiratory system: Poor air movement, decreased at the bases Cardiovascular system: S1 & S2 heard, tachycardic  Gastrointestinal system: Abdomen is nondistended, soft and nontender.bowel sounds present Central nervous system: Awake alert, oriented x2, moves all extremities, decreased sensations in both lower extremities Extremities: Trace edema Skin: No rashes on exposed skin Psychiatry: Flat affect    Data Reviewed:   CBC: Recent Labs  Lab 08/01/20 1127 08/01/20 1604 08/01/20 2001 08/02/20 2022 08/03/20 0253 08/03/20 0908 08/03/20 1510 08/04/20 0515  WBC 4.0 5.2  --   --   --   --   --   --  NEUTROABS 2.6  --   --   --   --   --   --   --   HGB 12.2* 8.5*   < > 11.3* 11.0* 11.8* 11.8* 10.9*  HCT 35.5* 26.2*   < > 32.5* 31.3* 34.8* 33.1* 32.8*  MCV 95.7 100.4*  --   --   --   --   --   --   PLT 85* 85*  --   --   --   --   --   --    < > = values in this interval not displayed.   Basic Metabolic Panel: Recent Labs  Lab 08/01/20 1127  08/01/20 1343 08/02/20 0311 08/03/20 0253 08/04/20 0515  NA 131*  --  134* 135 133*  K 3.7  --  4.0 4.5 4.1  CL 92*  --  102 106 102  CO2 22  --  25 23 24   GLUCOSE 102*  --  151* 119* 96  BUN 6*  --  8 8 9   CREATININE 0.75  --  0.74 0.63 0.87  CALCIUM 8.3*  --  6.8* 7.5* 7.5*  MG  --  1.6* 2.1 2.1 1.9  PHOS  --  3.1 3.4 4.6 3.5   GFR: Estimated Creatinine Clearance: 79.7 mL/min (by C-G formula based on SCr of 0.87 mg/dL). Liver Function Tests: Recent Labs  Lab 08/01/20 1127 08/02/20 0311 08/03/20 0253  AST 205* 82* 109*  ALT 65* 35 41  ALKPHOS 229* 120 141*  BILITOT 2.1* 0.9 1.2  PROT 5.6* 3.5* 4.2*  ALBUMIN 2.7* 1.8* 2.0*   Recent Labs  Lab 08/01/20 1127  LIPASE 25   No results for input(s): AMMONIA in the last 168 hours. Coagulation Profile: Recent Labs  Lab 08/01/20 1127  INR 1.0   Cardiac Enzymes: Recent Labs  Lab 08/01/20 1604  CKTOTAL 52   BNP (last 3 results) No results for input(s): PROBNP in the last 8760 hours. HbA1C: No results for input(s): HGBA1C in the last 72 hours. CBG: No results for input(s): GLUCAP in the last 168 hours. Lipid Profile: No results for input(s): CHOL, HDL, LDLCALC, TRIG, CHOLHDL, LDLDIRECT in the last 72 hours. Thyroid Function Tests: No results for input(s): TSH, T4TOTAL, FREET4, T3FREE, THYROIDAB in the last 72 hours. Anemia Panel: No results for input(s): VITAMINB12, FOLATE, FERRITIN, TIBC, IRON, RETICCTPCT in the last 72 hours. Urine analysis:    Component Value Date/Time   COLORURINE YELLOW (A) 08/01/2020 1343   APPEARANCEUR CLEAR (A) 08/01/2020 1343   LABSPEC 1.003 (L) 08/01/2020 1343   PHURINE 8.0 08/01/2020 1343   GLUCOSEU NEGATIVE 08/01/2020 1343   HGBUR NEGATIVE 08/01/2020 1343   BILIRUBINUR NEGATIVE 08/01/2020 1343   KETONESUR NEGATIVE 08/01/2020 1343   PROTEINUR NEGATIVE 08/01/2020 1343   NITRITE NEGATIVE 08/01/2020 1343   LEUKOCYTESUR NEGATIVE 08/01/2020 1343   Sepsis  Labs: @LABRCNTIP (procalcitonin:4,lacticidven:4)  ) Recent Results (from the past 240 hour(s))  Resp Panel by RT-PCR (Flu A&B, Covid) Nasopharyngeal Swab     Status: None   Collection Time: 08/01/20  1:43 PM   Specimen: Nasopharyngeal Swab; Nasopharyngeal(NP) swabs in vial transport medium  Result Value Ref Range Status   SARS Coronavirus 2 by RT PCR NEGATIVE NEGATIVE Final    Comment: (NOTE) SARS-CoV-2 target nucleic acids are NOT DETECTED.  The SARS-CoV-2 RNA is generally detectable in upper respiratory specimens during the acute phase of infection. The lowest concentration of SARS-CoV-2 viral copies this assay can detect is 138 copies/mL. A negative result  does not preclude SARS-Cov-2 infection and should not be used as the sole basis for treatment or other patient management decisions. A negative result may occur with  improper specimen collection/handling, submission of specimen other than nasopharyngeal swab, presence of viral mutation(s) within the areas targeted by this assay, and inadequate number of viral copies(<138 copies/mL). A negative result must be combined with clinical observations, patient history, and epidemiological information. The expected result is Negative.  Fact Sheet for Patients:  BloggerCourse.com  Fact Sheet for Healthcare Providers:  SeriousBroker.it  This test is no t yet approved or cleared by the [place] FDA and  has been authorized for detection and/or diagnosis of SARS-CoV-2 by FDA under an Emergency Use Authorization (EUA). This EUA will remain  in effect (meaning this test can be used) for the duration of the COVID-19 declaration under Section 564(b)(1) of the Act, 21 U.S.C.section 360bbb-3(b)(1), unless the authorization is terminated  or revoked sooner.       Influenza A by PCR NEGATIVE NEGATIVE Final   Influenza B by PCR NEGATIVE NEGATIVE Final    Comment: (NOTE) The Xpert  Xpress SARS-CoV-2/FLU/RSV plus assay is intended as an aid in the diagnosis of influenza from Nasopharyngeal swab specimens and should not be used as a sole basis for treatment. Nasal washings and aspirates are unacceptable for Xpert Xpress SARS-CoV-2/FLU/RSV testing.  Fact Sheet for Patients: BloggerCourse.com  Fact Sheet for Healthcare Providers: SeriousBroker.it  This test is not yet approved or cleared by the [place] FDA and has been authorized for detection and/or diagnosis of SARS-CoV-2 by FDA under an Emergency Use Authorization (EUA). This EUA will remain in effect (meaning this test can be used) for the duration of the COVID-19 declaration under Section 564(b)(1) of the Act, 21 U.S.C. section 360bbb-3(b)(1), unless the authorization is terminated or revoked.  Performed at [facility], [address], [place], [place] [address]          Radiology Studies: No results found.      Scheduled Meds: . diazepam  5 mg Oral BID  . [START ON 08/05/2020] folic acid  1 mg Oral Daily  . [START ON 08/05/2020] pantoprazole  40 mg Oral Daily  . thiamine  100 mg Oral Daily   Continuous Infusions: . sodium chloride       LOS: 3 days    Time spent:    Zannie Cove, MD [facility] 08/04/2020, 10:28 AM

## 2020-08-04 NOTE — Progress Notes (Signed)
3 attempts made to start IV by 2 RNs at this time. No success. IV team consulted.

## 2020-08-04 NOTE — Progress Notes (Incomplete)
Dr Arville Care gave written order via secure chat. Trazdone 25 mg at bedtime PRN. Order placed per providers order

## 2020-08-04 NOTE — Consult Note (Signed)
PHARMACY CONSULT NOTE - FOLLOW UP  Pharmacy Consult for Electrolyte Monitoring and Replacement   Recent Labs: Potassium (mmol/L)  Date Value  08/04/2020 4.1   Magnesium (mg/dL)  Date Value  16/01/9603 1.9   Calcium (mg/dL)  Date Value  54/12/8117 7.5 (L)   Albumin (g/dL)  Date Value  14/78/2956 2.0 (L)   Phosphorus (mg/dL)  Date Value  21/30/8657 3.5   Sodium (mmol/L)  Date Value  08/04/2020 133 (L)   Corrected Ca: 9.3  Assessment: Patient with a significant history of alcohol abuse and has right upper quadrant pain with abnormal liver enzymes. Patient with large bowel movement that was dark and tarry with gross amount of blood in it. Pt admitted to ICU. Pharmacy has been consulted to monitor and replace electrolytes.  Goal of Therapy:  Electrolytes WNL  Plan:   Electrolytes WNL, no replacement needed at this time  Recheck electrolytes with AM labs   Bari Mantis PharmD Clinical Pharmacist 08/04/2020

## 2020-08-05 LAB — COMPREHENSIVE METABOLIC PANEL
ALT: 36 U/L (ref 0–44)
AST: 70 U/L — ABNORMAL HIGH (ref 15–41)
Albumin: 2.1 g/dL — ABNORMAL LOW (ref 3.5–5.0)
Alkaline Phosphatase: 130 U/L — ABNORMAL HIGH (ref 38–126)
Anion gap: 8 (ref 5–15)
BUN: 6 mg/dL — ABNORMAL LOW (ref 8–23)
CO2: 26 mmol/L (ref 22–32)
Calcium: 7.9 mg/dL — ABNORMAL LOW (ref 8.9–10.3)
Chloride: 105 mmol/L (ref 98–111)
Creatinine, Ser: 0.62 mg/dL (ref 0.61–1.24)
GFR, Estimated: >60 mL/min (ref >60)
Glucose, Bld: 88 mg/dL (ref 70–99)
Potassium: 3.9 mmol/L (ref 3.5–5.1)
Sodium: 139 mmol/L (ref 135–145)
Total Bilirubin: 1.1 mg/dL (ref 0.3–1.2)
Total Protein: 4.5 g/dL — ABNORMAL LOW (ref 6.5–8.1)

## 2020-08-05 LAB — CBC
HCT: 31.6 % — ABNORMAL LOW (ref 39.0–52.0)
HCT: 32.2 % — ABNORMAL LOW (ref 39.0–52.0)
Hemoglobin: 10.6 g/dL — ABNORMAL LOW (ref 13.0–17.0)
Hemoglobin: 10.9 g/dL — ABNORMAL LOW (ref 13.0–17.0)
MCH: 32.5 pg (ref 26.0–34.0)
MCH: 32.4 pg (ref 26.0–34.0)
MCHC: 33.5 g/dL (ref 30.0–36.0)
MCHC: 33.9 g/dL (ref 30.0–36.0)
MCV: 96.9 fL (ref 80.0–100.0)
MCV: 95.8 fL (ref 80.0–100.0)
Platelets: 93 10*3/uL — ABNORMAL LOW (ref 150–400)
Platelets: 100 10*3/uL — ABNORMAL LOW (ref 150–400)
RBC: 3.26 MIL/uL — ABNORMAL LOW (ref 4.22–5.81)
RBC: 3.36 MIL/uL — ABNORMAL LOW (ref 4.22–5.81)
RDW: 17.6 % — ABNORMAL HIGH (ref 11.5–15.5)
RDW: 17.8 % — ABNORMAL HIGH (ref 11.5–15.5)
WBC: 3.9 10*3/uL — ABNORMAL LOW (ref 4.0–10.5)
WBC: 4.2 10*3/uL (ref 4.0–10.5)
nRBC: 0 % (ref 0.0–0.2)
nRBC: 0 % (ref 0.0–0.2)

## 2020-08-05 LAB — MAGNESIUM: Magnesium: 2 mg/dL (ref 1.7–2.4)

## 2020-08-05 MED ORDER — DIAZEPAM 5 MG PO TABS
10.0000 mg | ORAL_TABLET | Freq: Two times a day (BID) | ORAL | Status: AC
  Administered 2020-08-05 (×2): 10 mg via ORAL
  Filled 2020-08-05 (×2): qty 2

## 2020-08-05 MED ORDER — HYDROCODONE-ACETAMINOPHEN 5-325 MG PO TABS
1.0000 | ORAL_TABLET | Freq: Four times a day (QID) | ORAL | Status: DC | PRN
  Administered 2020-08-05 – 2020-08-14 (×27): 1 via ORAL
  Filled 2020-08-05 (×28): qty 1

## 2020-08-05 MED ORDER — NICOTINE 14 MG/24HR TD PT24
14.0000 mg | MEDICATED_PATCH | Freq: Every day | TRANSDERMAL | Status: DC
  Administered 2020-08-05 – 2020-08-14 (×10): 14 mg via TRANSDERMAL
  Filled 2020-08-05 (×10): qty 1

## 2020-08-05 NOTE — TOC Initial Note (Signed)
Transition of Care [facility]) - Initial/Assessment Note    Patient Details  Name: Chad [name] MRN: [id number] Date of Birth: May 06, 1950  Transition of Care [facility]) CM/SW Contact:    Verna Czech [place], [place] Phone Number: [phone] 08/05/2020, 2:29 PM  Clinical Narrative:                 Patient is a 70 year old male admitted with abdominal pain. Patient states that he lives alone in an apartment. Patient admits to daily use of alcohol and a history of inpatient rehab X2. Patient provided with resources substance use/abuse treatment and agreed to utilizing resources, following his skilled nursing stay. Skilled nursing process discussed, patient interested in [employer] or any facility where he can smoke. It was explained that these options may be limited. PASRR pending at this time. Fl2 completed and faxed out to local facilities for potential bed offers.  Transitions of Care to continue to follow to address discharge needs.   Oronde Hallenbeck, LCSW Transitions of Care [phone]    Expected Discharge Plan: Skilled Nursing Facility Barriers to Discharge: Continued Medical Work up   Patient Goals and CMS Choice Patient states their goals for this hospitalization and ongoing recovery are:: "I want to get back right" CMS Medicare.gov Compare Post Acute Care list provided to:: Patient Choice offered to / list presented to : Patient  Expected Discharge Plan and Services Expected Discharge Plan: Skilled Nursing Facility In-house Referral: Clinical Social Work   Post Acute Care Choice: Skilled Nursing Facility (cain , walker) Living arrangements for the past 2 months: Apartment                                      Prior Living Arrangements/Services Living arrangements for the past 2 months: Apartment Lives with:: Self Patient language and need for interpreter reviewed:: Yes Do you feel safe going back to the place where you live?: Yes (following rehab)       Need for Family Participation in Patient Care: No (Comment) Care giver support system in place?: Yes (comment) Current home services: DME (cain, walker) Criminal Activity/Legal Involvement Pertinent to Current Situation/Hospitalization: No - Comment as needed  Activities of Daily Living      Permission Sought/Granted         Permission granted to share info w AGENCY: [facility]  Permission granted to share info w Relationship: friend     Emotional Assessment Appearance:: Appears older than stated age Attitude/Demeanor/Rapport: Engaged Affect (typically observed): Accepting,Adaptable Orientation: : Oriented to Self,Oriented to Place,Oriented to  Time,Oriented to Situation Alcohol / Substance Use: Alcohol Use Psych Involvement: No (comment)  Admission diagnosis:  Alcoholic ketoacidosis [E87.2] RUQ abdominal pain [R10.11] RUQ pain [R10.11] Acute GI bleeding [K92.2] Patient Active Problem List   Diagnosis Date Noted  . Blood in stool   . Duodenal ulcer without hemorrhage or perforation   . RUQ pain 08/01/2020  . Acute GI bleeding 08/01/2020  . Shock (HCC)   . Abdominal pain   . GI bleeding 05/02/2020  . Severe recurrent major depression without psychotic features (HCC) 02/08/2020  . 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 Hypokalemia 02/07/2020  . Hypophosphatemia 02/07/2020  . Lactic acidosis 02/04/2020  . Dehydration 02/04/2020  . Generalized weakness 02/04/2020  . Alcohol abuse with intoxication delirium (HCC)   . Anemia   . Closed hip fracture requiring operative repair with routine healing 01/14/2020  . Calculus of gallbladder  with acute cholecystitis without obstruction   . GI bleed 12/31/2019  . GIB (gastrointestinal bleeding) 12/31/2019  . Epigastric pain   . Chest pain   . Cough   . Tobacco abuse   . Alcohol abuse 02/20/2019  . Alcoholic ketoacidosis 01/23/2019  . Alcohol dependence with uncomplicated withdrawal (HCC) 01/23/2019  . Alcohol-induced mood  disorder (HCC) 01/23/2019  . Depression with anxiety 01/23/2019  . Acute hypoactive delirium due to multiple etiologies 10/20/2018  . Aspiration pneumonia (HCC) 10/20/2018  . Aspiration into airway 10/18/2018  . Wernicke's encephalopathy 10/18/2018  . Acute alcoholic liver disease 10/16/2018  . Severe protein-calorie malnutrition (HCC) 10/16/2018  . HTN (hypertension) 05/17/2018  . Alcohol withdrawal (HCC) 05/17/2018  . DVT (deep venous thrombosis) (HCC) 05/17/2018  . Major depressive disorder, recurrent, moderate (HCC) 12/29/2017  . Primary osteoarthritis of both hands 12/29/2017  . Psoriasis 12/29/2017  . 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 Viral warts 08/21/2015  . 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 Epidermal inclusion cyst 11/04/2013  . Hepatitis C antibody test positive 10/31/2013  . 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 History of prescription drug abuse 12/23/2012  . BPH (benign prostatic hyperplasia) 12/15/2011  . Tests ordered 12/15/2011  . Chronic shoulder pain 12/12/2011  . ED (erectile dysfunction) 12/12/2011  . GI bleed due to NSAIDs 12/12/2011  . Lumbar herniated disc 12/12/2011  . Rash 12/12/2011  . BACK PAIN, LUMBAR 11/10/2006   PCP:  Rayetta Humphrey, MD Pharmacy:   [phone] [place], [place] - [address] [place] [address] Phone: [phone] Fax: [phone]  [id number] [name], [place] - [address] AT [facility] OF [address] [facility] [address] [place] [address] Phone: [phone] Fax: [phone]     Social Determinants of Health (SDOH) Interventions    Readmission Risk Interventions No flowsheet data found.

## 2020-08-05 NOTE — Consult Note (Signed)
PHARMACY CONSULT NOTE  Pharmacy Consult for Electrolyte Monitoring and Replacement   Recent Labs: Potassium (mmol/L)  Date Value  08/05/2020 3.9   Magnesium (mg/dL)  Date Value  05/69/7948 2.0   Calcium (mg/dL)  Date Value  01/65/5374 7.9 (L)   Albumin (g/dL)  Date Value  82/70/7867 2.1 (L)   Phosphorus (mg/dL)  Date Value  54/49/2010 3.8   Sodium (mmol/L)  Date Value  08/05/2020 139   Corrected Ca: 9.4 mg/dL  Assessment: Patient with a significant history of alcohol abuse and has right upper quadrant pain with abnormal liver enzymes. Patient with large bowel movement that was dark and tarry with gross amount of blood in it. Pt admitted to ICU. Pharmacy has been consulted to monitor and replace electrolytes.  Goal of Therapy:  Electrolytes WNL  Plan:   Electrolytes WNL, no replacement needed at this time  Recheck electrolytes with AM labs   Burnis Medin, PharmD Clinical Pharmacist 08/05/2020

## 2020-08-05 NOTE — Progress Notes (Signed)
Pt in no acute distress. VSS. Oriented x3. Pain medication effective. Pt appears more relaxed since increasing valium. Report called to [employer]  2A.

## 2020-08-05 NOTE — Evaluation (Signed)
Physical Therapy Evaluation Patient Details Name: Chad [name] MRN: [id number] DOB: 27-Jun-1950 Today's Date: 08/05/2020   History of Present Illness  70 year old male with alcohol abuse who presented with right upper lobe quadrant abdominal pain, in the emergency department he has large amount of black tarry stool followed by he became hypotensive, tachycardic and pale  Clinical Impression  Pt seen for PT evaluation with co-tx with OT for pt & therapist safety. Pt requires mod assist +2 for supine>sit, but max assist +2 for sit>supine. Pt incontinent throughout session requiring dependent assist for changing into clean gown & hygiene. Pt is able to complete 4 STS from EOB with MAX assist +2, first 2 attempts using BUE HHA, last 2 attempts using RW (pt reports he uses one at baseline) with PT providing total assist for BUE placement on handles. Pt requires TOTAL assist +2 to attempt side steps to R along EOB with PT & OT providing max cuing & assistance for advancing BLE & RW. Pt demonstrates significant cognitive impairments in addition to functional deficits noted. At this time pt is unsafe to d/c home alone & would benefit from STR upon d/c to maximize independence with functional mobility & reduce fall risk prior to return home.     Follow Up Recommendations SNF    Equipment Recommendations  None recommended by PT (TBD in next venue)    Recommendations for Other Services       Precautions / Restrictions Precautions Precautions: Fall Restrictions Weight Bearing Restrictions: No      Mobility  Bed Mobility Overal bed mobility: Needs Assistance Bed Mobility: Supine to Sit;Sit to Supine     Supine to sit: Mod assist;+2 for physical assistance;HOB elevated Sit to supine: Max assist;+2 for physical assistance;HOB elevated        Transfers Overall transfer level: Needs assistance Equipment used: 2 person hand held assist;Rolling walker (2 wheeled) Transfers: Sit to/from  Stand Sit to Stand: Max assist;+2 physical assistance            Ambulation/Gait Ambulation/Gait assistance: Total assist;+2 physical assistance Gait Distance (Feet): 2 Feet Assistive device: Rolling walker (2 wheeled) Gait Pattern/deviations: Decreased step length - left;Decreased step length - right;Decreased dorsiflexion - left;Decreased dorsiflexion - right;Decreased stride length;Decreased weight shift to right;Decreased weight shift to left Gait velocity: decreased   General Gait Details: PT & OT provide TOTAL assist with multimodal cuing for sequencing & BLE & RW management/advancement to take a couple side steps to the R to [facility]  Stairs            Wheelchair Mobility    Modified Rankin (Stroke Patients Only)       Balance Overall balance assessment: Needs assistance Sitting-balance support: Bilateral upper extremity supported;Feet supported Sitting balance-Leahy Scale: Poor Sitting balance - Comments: poor awareness of LOB to L with inability to correct Postural control: Left lateral lean Standing balance support: During functional activity Standing balance-Leahy Scale: Zero Standing balance comment: max/total assist +2                             Pertinent Vitals/Pain Pain Assessment: Faces Faces Pain Scale: Hurts a little bit Pain Location: back Pain Descriptors / Indicators: Grimacing Pain Intervention(s): Repositioned    Home Living Family/patient expects to be discharged to:: Private residence (pt unable to provide information, all of the following information obtained from chart from 3 months ago) Living Arrangements: Alone Available Help at Discharge: Friend(s);Available PRN/intermittently Type of Home:  Apartment Home Access: Stairs to enter Entrance Stairs-Rails: Can reach both Entrance Stairs-Number of Steps: 13 Home Layout: One level Home Equipment: Walker - 2 wheels;Cane - single point;Shower seat - built in      Prior  Function Level of Independence: Independent with assistive device(s)         Comments: Pt is modified independent and uses RW for mobility. He is independent with most I/ADLs (bathing, dressing, cooking, cleaning). Pt reports he doesn't drive or work anymore. Friends assist with grocery shopping.     Hand Dominance   Dominant Hand: Right    Extremity/Trunk Assessment   Upper Extremity Assessment Upper Extremity Assessment: Generalized weakness    Lower Extremity Assessment Lower Extremity Assessment: Generalized weakness    Cervical / Trunk Assessment Cervical / Trunk Assessment: Kyphotic (forward head)  Communication   Communication: No difficulties  Cognition Arousal/Alertness: Lethargic   Overall Cognitive Status: Impaired/Different from baseline Area of Impairment: Orientation;Attention;Memory;Following commands;Safety/judgement;Problem solving;Awareness                 Orientation Level: Disoriented to;Place;Time;Situation (doesn't ever report his actual name, instead gives joking responses "Michaelangelo". Reports he's in "[facility]")   Memory: Decreased recall of precautions;Decreased short-term memory Following Commands: Follows one step commands inconsistently;Follows one step commands with increased time Safety/Judgement: Decreased awareness of safety;Decreased awareness of deficits Awareness: Intellectual Problem Solving: Slow processing;Decreased initiation;Difficulty sequencing;Requires verbal cues;Requires tactile cues        General Comments General comments (skin integrity, edema, etc.): max HR observed 122 bpm    Exercises     Assessment/Plan    PT Assessment Patient needs continued PT services  PT Problem List Decreased strength;Decreased mobility;Decreased safety awareness;Decreased range of motion;Decreased coordination;Decreased knowledge of precautions;Decreased activity tolerance;Decreased cognition;Cardiopulmonary status  limiting activity;Decreased balance;Decreased knowledge of use of DME;Pain       PT Treatment Interventions Therapeutic activities;DME instruction;Cognitive remediation;Modalities;Gait training;Therapeutic exercise;Patient/family education;Stair training;Balance training;Functional mobility training;Neuromuscular re-education;Manual techniques    PT Goals (Current goals can be found in the Care Plan section)  Acute Rehab PT Goals PT Goal Formulation: Patient unable to participate in goal setting Time For Goal Achievement: 08/19/20 Potential to Achieve Goals: Fair    Frequency Min 2X/week   Barriers to discharge Decreased caregiver support;Inaccessible home environment lives alone on 2nd floor apartment with flight of stairs to access    Co-evaluation PT/OT/SLP Co-Evaluation/Treatment: Yes Reason for Co-Treatment: Complexity of the patient's impairments (multi-system involvement);Necessary to address cognition/behavior during functional activity;For patient/therapist safety;To address functional/ADL transfers PT goals addressed during session: Mobility/safety with mobility;Balance;Proper use of DME         AM-PAC PT "6 Clicks" Mobility  Outcome Measure Help needed turning from your back to your side while in a flat bed without using bedrails?: Total Help needed moving from lying on your back to sitting on the side of a flat bed without using bedrails?: Total Help needed moving to and from a bed to a chair (including a wheelchair)?: Total Help needed standing up from a chair using your arms (e.g., wheelchair or bedside chair)?: Total Help needed to walk in hospital room?: Total Help needed climbing 3-5 steps with a railing? : Total 6 Click Score: 6    End of Session   Activity Tolerance: Patient limited by lethargy Patient left: in bed;with call bell/phone within reach;with bed alarm set;with nursing/sitter in room   PT Visit Diagnosis: Unsteadiness on feet (R26.81);Muscle  weakness (generalized) (M62.81);Difficulty in walking, not elsewhere classified (R26.2)    Time:  1610-9604 PT Time Calculation (min) (ACUTE ONLY): 23 min   Charges:   PT Evaluation $PT Eval Moderate Complexity: 1 Mod          Aleda Grana, PT, DPT 08/05/20, 10:35 AM   Sandi Mariscal 08/05/2020, 10:32 AM

## 2020-08-05 NOTE — Evaluation (Addendum)
Occupational Therapy Evaluation Patient Details Name: Chad [name] MRN: [id number] DOB: Aug 06, 1950 Today's Date: 08/05/2020    History of Present Illness 70 year old male with alcohol abuse who presented with right upper lobe quadrant abdominal pain, in the emergency department he has large amount of black tarry stool followed by he became hypotensive, tachycardic and pale   Clinical Impression   MR Gunning was seen for OT/PT co-evaluation this date. Pt poor historian, reports PTA required MOD I for mobility and ADLs. Per chart review, pt lives alone in 2nd story apartment with 13 STE. Pt presents to acute OT demonstrating impaired ADL performance and functional mobility 2/2 decreased activity tolerance, functional strength/ROM/balance deficits, poor safety awareness, and poor insight into deficits. Pt unable to state name/location, reports year accurately as '22. Per RN at bedside, pt may be lethargic r/t medication.   Upon arrival RN at bedside reporting soiled bed sheets, assisted with cleanup. Pt currently requires MOD A x2 don B socks seated EOB - assist for dynamic sitting balance and threading over toes/heel. MAX A x2 + RW for sit<>stand, upon standing pt began urinating. MAX A don/doff gown seated EOB - assist for sitting balance and sequencing. Pt would benefit from skilled OT to address noted impairments and functional limitations (see below for any additional details) in order to maximize safety and independence while minimizing falls risk and caregiver burden. Upon hospital discharge, recommend STR to maximize pt safety and return to PLOF.     Follow Up Recommendations  SNF    Equipment Recommendations  Other (comment) (defer to next veneu of care)    Recommendations for Other Services       Precautions / Restrictions Precautions Precautions: Fall Restrictions Weight Bearing Restrictions: No      Mobility Bed Mobility Overal bed mobility: Needs Assistance Bed Mobility:  Supine to Sit;Sit to Supine     Supine to sit: Mod assist;+2 for physical assistance;HOB elevated Sit to supine: Max assist;+2 for physical assistance;HOB elevated        Transfers Overall transfer level: Needs assistance Equipment used: Rolling walker (2 wheeled) Transfers: Sit to/from Stand Sit to Stand: Max assist;+2 physical assistance         General transfer comment: x3 trials STS at EOB, poor sequencing for ~5 side steps at EOB    Balance Overall balance assessment: Needs assistance Sitting-balance support: Bilateral upper extremity supported;Feet supported Sitting balance-Leahy Scale: Poor Sitting balance - Comments: poor awareness of LOB to L with inability to correct Postural control: Left lateral lean Standing balance support: During functional activity Standing balance-Leahy Scale: Zero Standing balance comment: max +2                           ADL either performed or assessed with clinical judgement   ADL Overall ADL's : Needs assistance/impaired                                       General ADL Comments: MOD A x2 don B socks seated EOB - assist for dynamic sitting balance and threading over toes/heel. MAX A x2 + RW for urinating in standing. MAX A don/doff gown seated EOB - assist for sitting balance and sequencing.                  Pertinent Vitals/Pain Pain Assessment: Faces Faces Pain Scale: Hurts a little bit Pain  Location: back Pain Descriptors / Indicators: Grimacing Pain Intervention(s): Repositioned     Hand Dominance Right   Extremity/Trunk Assessment Upper Extremity Assessment Upper Extremity Assessment: Generalized weakness   Lower Extremity Assessment Lower Extremity Assessment: Generalized weakness   Cervical / Trunk Assessment Cervical / Trunk Assessment: Kyphotic   Communication Communication Communication: No difficulties   Cognition Arousal/Alertness: Lethargic Behavior During Therapy: WFL  for tasks assessed/performed Overall Cognitive Status: Impaired/Different from baseline Area of Impairment: Orientation;Attention;Memory;Following commands;Safety/judgement;Problem solving;Awareness                 Orientation Level: Disoriented to;Place;Time;Situation (doesn't ever report his actual name, instead gives joking responses "Michaelangelo". Reports he's in "[facility]")   Memory: Decreased recall of precautions;Decreased short-term memory Following Commands: Follows one step commands inconsistently;Follows one step commands with increased time Safety/Judgement: Decreased awareness of safety;Decreased awareness of deficits Awareness: Intellectual Problem Solving: Slow processing;Decreased initiation;Difficulty sequencing;Requires verbal cues;Requires tactile cues     General Comments  HR 122 in standing    Exercises Exercises: Other exercises Other Exercises Other Exercises: Pt edcuated re: OT role, DME recs, d/c recs, falls prevention Other Exercises: LBD, toileting, sup<>sit, sit<>stand, sitting/standing balance/tolerance, ~5 steps EOB   Shoulder Instructions      Home Living Family/patient expects to be discharged to:: Private residence Living Arrangements: Alone Available Help at Discharge: Friend(s);Available PRN/intermittently Type of Home: Apartment Home Access: Stairs to enter Entrance Stairs-Number of Steps: 13 Entrance Stairs-Rails: Can reach both Home Layout: One level     Bathroom Shower/Tub: Chief Strategy Officer: Standard     Home Equipment: Environmental consultant - 2 wheels;Cane - single point;Shower seat - built in   Additional Comments: pt unable to provide information, all of the following information obtained from chart from 3 months ago      Prior Functioning/Environment Level of Independence: Independent with assistive device(s)        Comments: Pt is modified independent and uses RW for mobility. He is independent with most  I/ADLs (bathing, dressing, cooking, cleaning). Pt reports he doesn't drive or work anymore. Friends assist with grocery shopping.        OT Problem List: Decreased strength;Decreased range of motion;Decreased activity tolerance;Impaired balance (sitting and/or standing);Decreased safety awareness;Decreased cognition;Decreased coordination;Decreased knowledge of use of DME or AE      OT Treatment/Interventions: Self-care/ADL training;Therapeutic exercise;Energy conservation;DME and/or AE instruction;Therapeutic activities;Patient/family education;Balance training    OT Goals(Current goals can be found in the care plan section) Acute Rehab OT Goals Patient Stated Goal: unable to state OT Goal Formulation: With patient Time For Goal Achievement: 08/19/20 Potential to Achieve Goals: Good ADL Goals Pt Will Perform Grooming: with modified independence;sitting Pt Will Perform Lower Body Dressing: with supervision;sitting/lateral leans Pt Will Transfer to Toilet: with min assist;ambulating;bedside commode (c LRAD PRN) Additional ADL Goal #1: Pt will Independently verbalize plan to implement x3 falls prevention strategies  OT Frequency: Min 1X/week           Co-evaluation PT/OT/SLP Co-Evaluation/Treatment: Yes Reason for Co-Treatment: Necessary to address cognition/behavior during functional activity;For patient/therapist safety;To address functional/ADL transfers PT goals addressed during session: Proper use of DME;Balance;Mobility/safety with mobility OT goals addressed during session: ADL's and self-care      AM-PAC OT "6 Clicks" Daily Activity     Outcome Measure Help from another person eating meals?: None Help from another person taking care of personal grooming?: A Lot Help from another person toileting, which includes using toliet, bedpan, or urinal?: A Lot Help from another  person bathing (including washing, rinsing, drying)?: A Lot Help from another person to put on and taking  off regular upper body clothing?: A Lot Help from another person to put on and taking off regular lower body clothing?: A Lot 6 Click Score: 14   End of Session Equipment Utilized During Treatment: Engineer, water Communication: Mobility status  Activity Tolerance: Patient tolerated treatment well Patient left: in bed;with call bell/phone within reach;with bed alarm set;with nursing/sitter in room  OT Visit Diagnosis: Other abnormalities of gait and mobility (R26.89);Muscle weakness (generalized) (M62.81)                Time: 3785-8850 OT Time Calculation (min): 23 min Charges:  OT General Charges $OT Visit: 1 Visit OT Treatments $Self Care/Home Management : 8-22 mins  Kathie Dike, M.S. OTR/L  08/05/20, 11:34 AM  ascom [phone]

## 2020-08-05 NOTE — Progress Notes (Addendum)
PROGRESS NOTE    Chad [name]  RAQ:[id number] DOB: 1951-01-03 DOA: 08/01/2020 PCP: Rayetta Humphrey, MD  Brief Narrative: 69/M w history of alcoholism, chronic back pain, liver cirrhosis, Barrett's esophagus, peptic ulcer disease, pneumonia presented to the ED on 4/13 with chest pain, subsequently he was poorly responsive with melena, acute blood loss anemia, hypotension and tachycardia. -He was admitted to the ICU with metabolic encephalopathy, hypotension, acute blood loss anemia.  CT abdomen noted extensive hepatic steatosis -Stabilized with fluid resuscitation and blood transfusions -Underwent an endoscopy 4/14 which showed gastric and duodenal ulcers which were nonbleeding -Subsequently transferred to [facility] service today 4/16    Assessment & Plan:   Hemorrhagic shock Gastric and duodenal ulcers -Likely secondary to alcoholism, thankfully does not have varices -Endoscopy 4/14 noted nonbleeding gastric and duodenal ulcers -Treated with fluid resuscitation and transfused 2 units of PRBC this admission -Blood pressure and hemoglobin is stable now -PT OT, ambulate as tolerated  Alcoholic liver disease Severe hypoalbuminemia Severe hepatic steatosis noted on CT, could have cirrhosis as well considering the degree of hypoalbuminemia  Alcohol withdrawal Heavy alcohol abuse -Reportedly drinks 1 to 2 pints of hard liquor daily -He has significant tremors and anxiety, diaphoresis etc. concerning for EtOH withdrawal -Continue scheduled Valium will increase dose and as needed Ativan per CIWA protocol -Continue thiamine, folate  Hyponatremia Hypomagnesemia -Replace and monitor  Thrombocytopenia -Chronic, likely secondary to alcoholism and cirrhosis  Positive Hep C Ab  DVT prophylaxis: SCDs due to chronic thrombocytopenia Code Status: Full code Family Communication: No family at bedside Disposition Plan:  Status is: Inpatient  Remains inpatient appropriate because:Inpatient  level of care appropriate due to severity of illness   Dispo: The patient is from: Home              Anticipated d/c is to: SNF              Patient currently is not medically stable to d/c.   Difficult to place patient No  Consultants:   Gastroenterology  PCCM transfer   Procedures: EGD 4/14  Antimicrobials:    Subjective: -Increased tremors agitation and shakes overnight, got 6 doses of Ativan overnight  Objective: Vitals:   08/05/20 0400 08/05/20 0700 08/05/20 0800 08/05/20 0900  BP: 115/72 133/84 132/85 112/80  Pulse: 92 93 93 (!) 126  Resp:  (!) 36 19 14  Temp: 97.8 F (36.6 C)  98.1 F (36.7 C)   TempSrc: Oral  Oral   SpO2: 94% 93% 97% 100%  Weight:      Height:        Intake/Output Summary (Last 24 hours) at 08/05/2020 1103 Last data filed at 08/05/2020 0951 Gross per 24 hour  Intake 30.7 ml  Output 1600 ml  Net -1569.3 ml   Filed Weights   08/01/20 1106 08/02/20 0500 08/03/20 0500  Weight: 73.9 kg 72.9 kg 76 kg    Examination:  General exam: Chronically ill disheveled male, sitting up in bed, awake alert oriented to self and place, mild cognitive deficits noted CVS: S1-S2, regular rhythm today Lungs: Poor air movement bilaterally, decreased at the bases Abdomen: Soft, nontender, mildly distended, bowel sounds present Extremities: Trace edema Neuro: Moves all extremities, decreased sensations in both lower legs Psych: Poor insight and flat affect  Data Reviewed:   CBC: Recent Labs  Lab 08/01/20 1127 08/01/20 1604 08/01/20 2001 08/03/20 0908 08/03/20 1510 08/04/20 0515 08/04/20 1028 08/05/20 0635  WBC 4.0 5.2  --   --   --   --  5.3 3.9*  NEUTROABS 2.6  --   --   --   --   --   --   --   HGB 12.2* 8.5*   < > 11.8* 11.8* 10.9* 10.4* 10.6*  HCT 35.5* 26.2*   < > 34.8* 33.1* 32.8* 31.1* 31.6*  MCV 95.7 100.4*  --   --   --   --  94.8 96.9  PLT 85* 85*  --   --   --   --  89* 93*   < > = values in this interval not displayed.   Basic  Metabolic Panel: Recent Labs  Lab 08/01/20 1343 08/02/20 0311 08/03/20 0253 08/04/20 0515 08/04/20 1028 08/05/20 0635  NA  --  134* 135 133* 134* 139  K  --  4.0 4.5 4.1 3.9 3.9  CL  --  102 106 102 103 105  CO2  --  25 23 24 23 26   GLUCOSE  --  151* 119* 96 107* 88  BUN  --  8 8 9 9  6*  CREATININE  --  0.74 0.63 0.87 0.74 0.62  CALCIUM  --  6.8* 7.5* 7.5* 7.6* 7.9*  MG 1.6* 2.1 2.1 1.9 1.7 2.0  PHOS 3.1 3.4 4.6 3.5 3.8  --    GFR: Estimated Creatinine Clearance: 86.7 mL/min (by C-G formula based on SCr of 0.62 mg/dL). Liver Function Tests: Recent Labs  Lab 08/01/20 1127 08/02/20 0311 08/03/20 0253 08/04/20 1028 08/05/20 0635  AST 205* 82* 109* 88* 70*  ALT 65* 35 41 42 36  ALKPHOS 229* 120 141* 133* 130*  BILITOT 2.1* 0.9 1.2 0.9 1.1  PROT 5.6* 3.5* 4.2* 4.5* 4.5*  ALBUMIN 2.7* 1.8* 2.0* 2.1* 2.1*   Recent Labs  Lab 08/01/20 1127  LIPASE 25   No results for input(s): AMMONIA in the last 168 hours. Coagulation Profile: Recent Labs  Lab 08/01/20 1127  INR 1.0   Cardiac Enzymes: Recent Labs  Lab 08/01/20 1604  CKTOTAL 52   BNP (last 3 results) No results for input(s): PROBNP in the last 8760 hours. HbA1C: No results for input(s): HGBA1C in the last 72 hours. CBG: No results for input(s): GLUCAP in the last 168 hours. Lipid Profile: No results for input(s): CHOL, HDL, LDLCALC, TRIG, CHOLHDL, LDLDIRECT in the last 72 hours. Thyroid Function Tests: Recent Labs    08/04/20 1028  TSH 4.262   Anemia Panel: Recent Labs    08/04/20 1028  VITAMINB12 1,068*   Urine analysis:    Component Value Date/Time   COLORURINE YELLOW (A) 08/01/2020 1343   APPEARANCEUR CLEAR (A) 08/01/2020 1343   LABSPEC 1.003 (L) 08/01/2020 1343   PHURINE 8.0 08/01/2020 1343   GLUCOSEU NEGATIVE 08/01/2020 1343   HGBUR NEGATIVE 08/01/2020 1343   BILIRUBINUR NEGATIVE 08/01/2020 1343   KETONESUR NEGATIVE 08/01/2020 1343   PROTEINUR NEGATIVE 08/01/2020 1343   NITRITE  NEGATIVE 08/01/2020 1343   LEUKOCYTESUR NEGATIVE 08/01/2020 1343   Sepsis Labs: @LABRCNTIP (procalcitonin:4,lacticidven:4)  ) Recent Results (from the past 240 hour(s))  Resp Panel by RT-PCR (Flu A&B, Covid) Nasopharyngeal Swab     Status: None   Collection Time: 08/01/20  1:43 PM   Specimen: Nasopharyngeal Swab; Nasopharyngeal(NP) swabs in vial transport medium  Result Value Ref Range Status   SARS Coronavirus 2 by RT PCR NEGATIVE NEGATIVE Final    Comment: (NOTE) SARS-CoV-2 target nucleic acids are NOT DETECTED.  The SARS-CoV-2 RNA is generally detectable in upper respiratory specimens during the acute phase of infection. The  lowest concentration of SARS-CoV-2 viral copies this assay can detect is 138 copies/mL. A negative result does not preclude SARS-Cov-2 infection and should not be used as the sole basis for treatment or other patient management decisions. A negative result may occur with  improper specimen collection/handling, submission of specimen other than nasopharyngeal swab, presence of viral mutation(s) within the areas targeted by this assay, and inadequate number of viral copies(<138 copies/mL). A negative result must be combined with clinical observations, patient history, and epidemiological information. The expected result is Negative.  Fact Sheet for Patients:  BloggerCourse.com  Fact Sheet for Healthcare Providers:  SeriousBroker.it  This test is no t yet approved or cleared by the [place] FDA and  has been authorized for detection and/or diagnosis of SARS-CoV-2 by FDA under an Emergency Use Authorization (EUA). This EUA will remain  in effect (meaning this test can be used) for the duration of the COVID-19 declaration under Section 564(b)(1) of the Act, 21 U.S.C.section 360bbb-3(b)(1), unless the authorization is terminated  or revoked sooner.       Influenza A by PCR NEGATIVE NEGATIVE Final    Influenza B by PCR NEGATIVE NEGATIVE Final    Comment: (NOTE) The Xpert Xpress SARS-CoV-2/FLU/RSV plus assay is intended as an aid in the diagnosis of influenza from Nasopharyngeal swab specimens and should not be used as a sole basis for treatment. Nasal washings and aspirates are unacceptable for Xpert Xpress SARS-CoV-2/FLU/RSV testing.  Fact Sheet for Patients: BloggerCourse.com  Fact Sheet for Healthcare Providers: SeriousBroker.it  This test is not yet approved or cleared by the [place] FDA and has been authorized for detection and/or diagnosis of SARS-CoV-2 by FDA under an Emergency Use Authorization (EUA). This EUA will remain in effect (meaning this test can be used) for the duration of the COVID-19 declaration under Section 564(b)(1) of the Act, 21 U.S.C. section 360bbb-3(b)(1), unless the authorization is terminated or revoked.  Performed at [facility], [address] [place], [place] [address]      Scheduled Meds: . diazepam  10 mg Oral BID  . folic acid  1 mg Oral Daily  . pantoprazole  40 mg Oral Daily  . thiamine  100 mg Oral Daily   Continuous Infusions: . sodium chloride       LOS: 4 days    Time spent:  Zannie Cove, MD [facility] 08/05/2020, 11:03 AM

## 2020-08-05 NOTE — NC FL2 (Signed)
[place] MEDICAID FL2 LEVEL OF CARE SCREENING TOOL     IDENTIFICATION  Patient Name: Chad [name] Birthdate: 1950/07/23 Sex: male Admission Date (Current Location): 08/01/2020  [facility] and [place] Number:  Chiropodist and Address:  [facility], [address] [place], [place] [address]      Provider Number:    Attending Physician Name and Address:  Zannie Cove, MD  Relative Name and Phone Number:  elaine shepherd [phone]    Current Level of Care: SNF Recommended Level of Care: Skilled Nursing Facility Prior Approval Number:    Date Approved/Denied:   PASRR Number: pending  Discharge Plan: SNF    Current Diagnoses: Patient Active Problem List   Diagnosis Date Noted  . Blood in stool   . Duodenal ulcer without hemorrhage or perforation   . RUQ pain 08/01/2020  . Acute GI bleeding 08/01/2020  . Shock (HCC)   . Abdominal pain   . GI bleeding 05/02/2020  . Severe recurrent major depression without psychotic features (HCC) 02/08/2020  . 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 Hypokalemia 02/07/2020  . Hypophosphatemia 02/07/2020  . Lactic acidosis 02/04/2020  . Dehydration 02/04/2020  . Generalized weakness 02/04/2020  . Alcohol abuse with intoxication delirium (HCC)   . Anemia   . Closed hip fracture requiring operative repair with routine healing 01/14/2020  . Calculus of gallbladder with acute cholecystitis without obstruction   . GI bleed 12/31/2019  . GIB (gastrointestinal bleeding) 12/31/2019  . Epigastric pain   . Chest pain   . Cough   . Tobacco abuse   . Alcohol abuse 02/20/2019  . Alcoholic ketoacidosis 01/23/2019  . Alcohol dependence with uncomplicated withdrawal (HCC) 01/23/2019  . 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 Depression with anxiety 01/23/2019  . Acute hypoactive delirium due to multiple etiologies 10/20/2018  . Aspiration pneumonia (HCC) 10/20/2018  . Aspiration into airway  10/18/2018  . Wernicke's encephalopathy 10/18/2018  . Acute alcoholic liver disease 10/16/2018  . Severe protein-calorie malnutrition (HCC) 10/16/2018  . HTN (hypertension) 05/17/2018  . Alcohol withdrawal (HCC) 05/17/2018  . DVT (deep venous thrombosis) (HCC) 05/17/2018  . Major depressive disorder, recurrent, moderate (HCC) 12/29/2017  . Primary osteoarthritis of both hands 12/29/2017  . Psoriasis 12/29/2017  . 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 Viral warts 08/21/2015  . 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 Epidermal inclusion cyst 11/04/2013  . Hepatitis C antibody test positive 10/31/2013  . 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 History of prescription drug abuse 12/23/2012  . BPH (benign prostatic hyperplasia) 12/15/2011  . Tests ordered 12/15/2011  . Chronic shoulder pain 12/12/2011  . ED (erectile dysfunction) 12/12/2011  . GI bleed due to NSAIDs 12/12/2011  . Lumbar herniated disc 12/12/2011  . Rash 12/12/2011  . BACK PAIN, LUMBAR 11/10/2006    Orientation RESPIRATION BLADDER Height & Weight     Self,Place,Situation  Normal Incontinent Weight: 167 lb 8.8 oz (76 kg) Height:  5\' 8"  (172.7 cm)  BEHAVIORAL SYMPTOMS/MOOD NEUROLOGICAL BOWEL NUTRITION STATUS      Incontinent Diet  AMBULATORY STATUS COMMUNICATION OF NEEDS Skin   Extensive Assist Verbally Normal                       Personal Care Assistance Level of Assistance  Feeding,Dressing,Bathing Bathing Assistance: Maximum assistance Feeding assistance: Independent Dressing Assistance: Maximum assistance     Functional Limitations Info  Sight,Hearing,Speech Sight Info: Adequate Hearing Info: Adequate Speech Info: Adequate  SPECIAL CARE FACTORS FREQUENCY  PT (By licensed PT),OT (By licensed OT)     PT Frequency: 5x per week OT Frequency: 5x per week            Contractures Contractures Info: Not present    Additional Factors Info  Code Status Code Status Info: full  code             Current Medications (08/05/2020):  This is the current hospital active medication list Current Facility-Administered Medications  Medication Dose Route Frequency Provider Last Rate Last Admin  . 0.9 %  sodium chloride infusion  10 mL/hr Intravenous Once Sharman Cheek, MD      . diazepam (VALIUM) tablet 10 mg  10 mg Oral BID Zannie Cove, MD   10 mg at 08/05/20 0837  . docusate sodium (COLACE) capsule 100 mg  100 mg Oral BID PRN Eugenie Norrie, NP      . folic acid (FOLVITE) tablet 1 mg  1 mg Oral Daily Zannie Cove, MD   1 mg at 08/05/20 [phone]  . HYDROcodone-acetaminophen (NORCO/VICODIN) 5-325 MG per tablet 1 tablet  1 tablet Oral Q6H PRN Zannie Cove, MD   1 tablet at 08/05/20 [phone]  . LORazepam (ATIVAN) tablet 1-4 mg  1-4 mg Oral Q1H PRN Zannie Cove, MD   3 mg at 08/04/20 1619   Or  . LORazepam (ATIVAN) injection 1-4 mg  1-4 mg Intravenous Q1H PRN Zannie Cove, MD   3 mg at 08/05/20 0650  . ondansetron (ZOFRAN) injection 4 mg  4 mg Intravenous Q6H PRN Eugenie Norrie, NP   4 mg at 08/02/20 2145  . pantoprazole (PROTONIX) EC tablet 40 mg  40 mg Oral Daily Zannie Cove, MD   40 mg at 08/05/20 7169  . polyethylene glycol (MIRALAX / GLYCOLAX) packet 17 g  17 g Oral Daily PRN Eugenie Norrie, NP      . thiamine tablet 100 mg  100 mg Oral Daily Zannie Cove, MD   100 mg at 08/05/20 6789  . traZODone (DESYREL) tablet 25 mg  25 mg Oral QHS PRN Mansy, Jan A, MD   25 mg at 08/04/20 2123     Discharge Medications: Please see discharge summary for a list of discharge medications.  Relevant Imaging Results:  Relevant Lab Results:   Additional Information SS#[id number]  Verna Czech [place], [place]

## 2020-08-06 ENCOUNTER — Inpatient Hospital Stay: Payer: Medicare HMO

## 2020-08-06 LAB — RENAL FUNCTION PANEL
Albumin: 2.3 g/dL — ABNORMAL LOW (ref 3.5–5.0)
Anion gap: 7 (ref 5–15)
BUN: 6 mg/dL — ABNORMAL LOW (ref 8–23)
CO2: 25 mmol/L (ref 22–32)
Calcium: 8.1 mg/dL — ABNORMAL LOW (ref 8.9–10.3)
Chloride: 103 mmol/L (ref 98–111)
Creatinine, Ser: 0.76 mg/dL (ref 0.61–1.24)
GFR, Estimated: >60 mL/min (ref >60)
Glucose, Bld: 103 mg/dL — ABNORMAL HIGH (ref 70–99)
Phosphorus: 4.2 mg/dL (ref 2.5–4.6)
Potassium: 3.9 mmol/L (ref 3.5–5.1)
Sodium: 135 mmol/L (ref 135–145)

## 2020-08-06 LAB — MAGNESIUM: Magnesium: 2 mg/dL (ref 1.7–2.4)

## 2020-08-06 MED ORDER — IPRATROPIUM-ALBUTEROL 0.5-2.5 (3) MG/3ML IN SOLN: 3.0000 mL | Freq: Four times a day (QID) | RESPIRATORY_TRACT | Status: DC | PRN

## 2020-08-06 MED ORDER — LORAZEPAM 2 MG/ML IJ SOLN
1.0000 mg | INTRAMUSCULAR | Status: DC | PRN
  Administered 2020-08-06 (×3): 2 mg via INTRAVENOUS
  Administered 2020-08-06 (×2): 3 mg via INTRAVENOUS
  Filled 2020-08-06: qty 2
  Filled 2020-08-06: qty 1
  Filled 2020-08-06: qty 2
  Filled 2020-08-06 (×2): qty 1

## 2020-08-06 MED ORDER — SODIUM CHLORIDE 0.9 % IV SOLN
3.0000 g | Freq: Four times a day (QID) | INTRAVENOUS | Status: AC
  Administered 2020-08-06 – 2020-08-10 (×17): 3 g via INTRAVENOUS
  Filled 2020-08-06 (×2): qty 8
  Filled 2020-08-06 (×2): qty 3
  Filled 2020-08-06 (×2): qty 8
  Filled 2020-08-06 (×3): qty 3
  Filled 2020-08-06: qty 8
  Filled 2020-08-06 (×5): qty 3
  Filled 2020-08-06 (×3): qty 8

## 2020-08-06 MED ORDER — LORAZEPAM 1 MG PO TABS
1.0000 mg | ORAL_TABLET | ORAL | Status: DC | PRN
  Administered 2020-08-07: 1 mg via ORAL
  Filled 2020-08-06: qty 1

## 2020-08-06 MED ORDER — DIAZEPAM 5 MG PO TABS
5.0000 mg | ORAL_TABLET | Freq: Two times a day (BID) | ORAL | Status: AC
  Administered 2020-08-06 (×2): 5 mg via ORAL
  Filled 2020-08-06 (×2): qty 1

## 2020-08-06 NOTE — Care Management Important Message (Signed)
Important Message  Patient Details  Name: Cai Anfinson MRN: [id number] Date of Birth: [birth year]   Medicare Important Message Given:  N/A - LOS <3 / Initial given by admissions     Olegario Messier A Consuelo Thayne 08/06/2020, 11:34 AM

## 2020-08-06 NOTE — Progress Notes (Signed)
PROGRESS NOTE    Chad [name]  BJS:[id number] DOB: 10-03-1950 DOA: 08/01/2020 PCP: Rayetta Humphrey, MD  Brief Narrative: 69/M w history of alcoholism, chronic back pain, liver cirrhosis, Barrett's esophagus, peptic ulcer disease, pneumonia presented to the ED on 4/13 with chest pain, subsequently he was poorly responsive with melena, acute blood loss anemia, hypotension and tachycardia. -He was admitted to the ICU with metabolic encephalopathy, hypotension, acute blood loss anemia.  CT abdomen noted extensive hepatic steatosis -Stabilized with fluid resuscitation and blood transfusions -Underwent an endoscopy 4/14 which showed gastric and duodenal ulcers which were nonbleeding -Subsequently transferred to [facility] service today 4/16    Assessment & Plan:   Hemorrhagic shock Gastric and duodenal ulcers -Likely secondary to alcoholism, thankfully does not have varices -Endoscopy 4/14 noted nonbleeding gastric and duodenal ulcers -Treated with fluid resuscitation and transfused 2 units of PRBC this admission -Blood pressure and hemoglobin is stable now -PT OT, ambulate as tolerated  Alcoholic liver disease Severe hypoalbuminemia Severe hepatic steatosis noted on CT, could have cirrhosis as well considering the degree of hypoalbuminemia  Alcohol withdrawal Heavy alcohol abuse -Reportedly drinks 1 to 2 pints of hard liquor daily -He has significant tremors and anxiety, diaphoresis etc. concerning for EtOH withdrawal -Continue scheduled Valium today, Ativan per CIWA protocol -Continue thiamine, folate -Ambulate, PT eval  Suspected aspiration pneumonia -Could be secondary to alcohol withdrawal, requiring high-dose Ativan in ICU -Start IV Unasyn, add nebs -SLP eval  Hyponatremia Hypomagnesemia -Replace and monitor  Thrombocytopenia -Chronic, likely secondary to alcoholism and cirrhosis  Positive Hep C Ab  DVT prophylaxis: SCDs due to chronic thrombocytopenia Code Status: Full  code Family Communication: No family at bedside Disposition Plan:  Status is: Inpatient  Remains inpatient appropriate because:Inpatient level of care appropriate due to severity of illness   Dispo: The patient is from: Home              Anticipated d/c is to: SNF              Patient currently is not medically stable to d/c.   Difficult to place patient No  Consultants:   Gastroenterology  PCCM transfer   Procedures: EGD 4/14  Antimicrobials:    Subjective: -Coughing a lot, had a lot of tremors and shakes, agitation overnight, got multiple doses of Ativan for alcohol withdrawal Objective: Vitals:   08/05/20 2127 08/06/20 0421 08/06/20 0810 08/06/20 1158  BP: 137/82 (!) 146/97 118/65 110/68  Pulse: 91 92 66 79  Resp: 18 18  18   Temp: 97.6 F (36.4 C) 98.4 F (36.9 C) 97.6 F (36.4 C) 98 F (36.7 C)  TempSrc: Oral Oral Oral Oral  SpO2: 97% 100% 100% 98%  Weight:      Height:        Intake/Output Summary (Last 24 hours) at 08/06/2020 1532 Last data filed at 08/06/2020 1417 Gross per 24 hour  Intake 1020 ml  Output 1700 ml  Net -680 ml   Filed Weights   08/01/20 1106 08/02/20 0500 08/03/20 0500  Weight: 73.9 kg 72.9 kg 76 kg    Examination:  General exam: Chronically ill disheveled male, sitting up in bed, awake alert oriented to self and place, cognitive deficits CVS: S1-S2, regular rate rhythm Lungs: Poor air movement bilaterally, decreased at the bases Abdomen: Soft, nontender, mildly distended, bowel sounds present Extremities: Trace edema  Neuro: Moves all extremities, decreased sensations in both lower legs Psych: Poor insight and flat affect  Data Reviewed:   CBC: Recent  Labs  Lab 08/01/20 1127 08/01/20 1604 08/01/20 2001 08/03/20 1510 08/04/20 0515 08/04/20 1028 08/05/20 0635 08/05/20 1113  WBC 4.0 5.2  --   --   --  5.3 3.9* 4.2  NEUTROABS 2.6  --   --   --   --   --   --   --   HGB 12.2* 8.5*   < > 11.8* 10.9* 10.4* 10.6* 10.9*   HCT 35.5* 26.2*   < > 33.1* 32.8* 31.1* 31.6* 32.2*  MCV 95.7 100.4*  --   --   --  94.8 96.9 95.8  PLT 85* 85*  --   --   --  89* 93* 100*   < > = values in this interval not displayed.   Basic Metabolic Panel: Recent Labs  Lab 08/02/20 0311 08/03/20 0253 08/04/20 0515 08/04/20 1028 08/05/20 0635 08/06/20 0527  NA 134* 135 133* 134* 139 135  K 4.0 4.5 4.1 3.9 3.9 3.9  CL 102 106 102 103 105 103  CO2 25 23 24 23 26 25   GLUCOSE 151* 119* 96 107* 88 103*  BUN 8 8 9 9  6* 6*  CREATININE 0.74 0.63 0.87 0.74 0.62 0.76  CALCIUM 6.8* 7.5* 7.5* 7.6* 7.9* 8.1*  MG 2.1 2.1 1.9 1.7 2.0 2.0  PHOS 3.4 4.6 3.5 3.8  --  4.2   GFR: Estimated Creatinine Clearance: 86.7 mL/min (by C-G formula based on SCr of 0.76 mg/dL). Liver Function Tests: Recent Labs  Lab 08/01/20 1127 08/02/20 0311 08/03/20 0253 08/04/20 1028 08/05/20 0635 08/06/20 0527  AST 205* 82* 109* 88* 70*  --   ALT 65* 35 41 42 36  --   ALKPHOS 229* 120 141* 133* 130*  --   BILITOT 2.1* 0.9 1.2 0.9 1.1  --   PROT 5.6* 3.5* 4.2* 4.5* 4.5*  --   ALBUMIN 2.7* 1.8* 2.0* 2.1* 2.1* 2.3*   Recent Labs  Lab 08/01/20 1127  LIPASE 25   No results for input(s): AMMONIA in the last 168 hours. Coagulation Profile: Recent Labs  Lab 08/01/20 1127  INR 1.0   Cardiac Enzymes: Recent Labs  Lab 08/01/20 1604  CKTOTAL 52   BNP (last 3 results) No results for input(s): PROBNP in the last 8760 hours. HbA1C: No results for input(s): HGBA1C in the last 72 hours. CBG: No results for input(s): GLUCAP in the last 168 hours. Lipid Profile: No results for input(s): CHOL, HDL, LDLCALC, TRIG, CHOLHDL, LDLDIRECT in the last 72 hours. Thyroid Function Tests: Recent Labs    08/04/20 1028  TSH 4.262   Anemia Panel: Recent Labs    08/04/20 1028  VITAMINB12 1,068*   Urine analysis:    Component Value Date/Time   COLORURINE YELLOW (A) 08/01/2020 1343   APPEARANCEUR CLEAR (A) 08/01/2020 1343   LABSPEC 1.003 (L) 08/01/2020  1343   PHURINE 8.0 08/01/2020 1343   GLUCOSEU NEGATIVE 08/01/2020 1343   HGBUR NEGATIVE 08/01/2020 1343   BILIRUBINUR NEGATIVE 08/01/2020 1343   KETONESUR NEGATIVE 08/01/2020 1343   PROTEINUR NEGATIVE 08/01/2020 1343   NITRITE NEGATIVE 08/01/2020 1343   LEUKOCYTESUR NEGATIVE 08/01/2020 1343   Sepsis Labs: @LABRCNTIP (procalcitonin:4,lacticidven:4)  ) Recent Results (from the past 240 hour(s))  Resp Panel by RT-PCR (Flu A&B, Covid) Nasopharyngeal Swab     Status: None   Collection Time: 08/01/20  1:43 PM   Specimen: Nasopharyngeal Swab; Nasopharyngeal(NP) swabs in vial transport medium  Result Value Ref Range Status   SARS Coronavirus 2 by RT PCR NEGATIVE NEGATIVE  Final    Comment: (NOTE) SARS-CoV-2 target nucleic acids are NOT DETECTED.  The SARS-CoV-2 RNA is generally detectable in upper respiratory specimens during the acute phase of infection. The lowest concentration of SARS-CoV-2 viral copies this assay can detect is 138 copies/mL. A negative result does not preclude SARS-Cov-2 infection and should not be used as the sole basis for treatment or other patient management decisions. A negative result may occur with  improper specimen collection/handling, submission of specimen other than nasopharyngeal swab, presence of viral mutation(s) within the areas targeted by this assay, and inadequate number of viral copies(<138 copies/mL). A negative result must be combined with clinical observations, patient history, and epidemiological information. The expected result is Negative.  Fact Sheet for Patients:  BloggerCourse.com  Fact Sheet for Healthcare Providers:  SeriousBroker.it  This test is no t yet approved or cleared by the [place] FDA and  has been authorized for detection and/or diagnosis of SARS-CoV-2 by FDA under an Emergency Use Authorization (EUA). This EUA will remain  in effect (meaning this test can be used)  for the duration of the COVID-19 declaration under Section 564(b)(1) of the Act, 21 U.S.C.section 360bbb-3(b)(1), unless the authorization is terminated  or revoked sooner.       Influenza A by PCR NEGATIVE NEGATIVE Final   Influenza B by PCR NEGATIVE NEGATIVE Final    Comment: (NOTE) The Xpert Xpress SARS-CoV-2/FLU/RSV plus assay is intended as an aid in the diagnosis of influenza from Nasopharyngeal swab specimens and should not be used as a sole basis for treatment. Nasal washings and aspirates are unacceptable for Xpert Xpress SARS-CoV-2/FLU/RSV testing.  Fact Sheet for Patients: BloggerCourse.com  Fact Sheet for Healthcare Providers: SeriousBroker.it  This test is not yet approved or cleared by the [place] FDA and has been authorized for detection and/or diagnosis of SARS-CoV-2 by FDA under an Emergency Use Authorization (EUA). This EUA will remain in effect (meaning this test can be used) for the duration of the COVID-19 declaration under Section 564(b)(1) of the Act, 21 U.S.C. section 360bbb-3(b)(1), unless the authorization is terminated or revoked.  Performed at [facility], [address] [place], [place] [address]      Scheduled Meds: . diazepam  5 mg Oral BID  . folic acid  1 mg Oral Daily  . nicotine  14 mg Transdermal Daily  . pantoprazole  40 mg Oral Daily  . thiamine  100 mg Oral Daily   Continuous Infusions: . sodium chloride       LOS: 5 days    Time spent:  Zannie Cove, MD [facility] 08/06/2020, 3:32 PM

## 2020-08-06 NOTE — Progress Notes (Signed)
Physical Therapy Treatment Patient Details Name: Chad [name] MRN: [id number] DOB: 05-06-1950 Today's Date: 08/06/2020    History of Present Illness 70 year old male with alcohol abuse who presented with right upper lobe quadrant abdominal pain, in the emergency department he has large amount of black tarry stool followed by he became hypotensive, tachycardic and pale    PT Comments    Patient making progress towards meeting PT goals. Patient continues to require assistance for bed mobility. Max A of one person for sit to stand transfer which is in improvement in independence from previous session. Standing tolerance limited to ~ 30 seconds with Max A required to maintain standing balance. Patient is fatigued with activity but motivated to participate. Recommend to continue PT to maximize independence with SNF recommended at discharge.    Follow Up Recommendations  SNF     Equipment Recommendations  Other (comment) (to be determined at next level of care)    Recommendations for Other Services       Precautions / Restrictions Precautions Precautions: Fall Restrictions Weight Bearing Restrictions: No    Mobility  Bed Mobility Overal bed mobility: Needs Assistance Bed Mobility: Supine to Sit;Sit to Supine     Supine to sit: Mod assist Sit to supine: Max assist   General bed mobility comments: assistance for trunk and intermittent BLE support. verbal cues for technique. increased time required to complete tasks    Transfers Overall transfer level: Needs assistance Equipment used: Rolling walker (2 wheeled) Transfers: Sit to/from Stand;Lateral/Scoot Transfers Sit to Stand: Max assist        Lateral/Scoot Transfers: Mod assist General transfer comment: Max A for lifting and lowering assistance for sit to stand transfers. verbal cues for technique. Mod A for lateral scooting in sitting position x 5-6 bouts using UE support. patient fatigued with activity. Sp02 95% on room  air  Ambulation/Gait             General Gait Details: unable to progress ambulation due to poor standing tolerance and fatigue with activity   Stairs             Wheelchair Mobility    Modified Rankin (Stroke Patients Only)       Balance Overall balance assessment: Needs assistance Sitting-balance support: Bilateral upper extremity supported;Feet supported Sitting balance-Leahy Scale: Fair Sitting balance - Comments: close stand by assistance provided to maintain sitting balance. flexed posture in sitting position with cues provided for upright sitting balance   Standing balance support: During functional activity Standing balance-Leahy Scale: Poor Standing balance comment: Max A with RW for UE support in standing position                            Cognition Arousal/Alertness: Lethargic Behavior During Therapy: WFL for tasks assessed/performed Overall Cognitive Status: Impaired/Different from baseline                               Problem Solving: Slow processing;Decreased initiation General Comments: patient able to follow single step commands consistently      Exercises General Exercises - Lower Extremity Ankle Circles/Pumps: AAROM;Strengthening;Both;10 reps;Supine Heel Slides: AAROM;Strengthening;Both;10 reps;Supine Hip ABduction/ADduction: AAROM;Strengthening;Both;10 reps;Supine Other Exercises Other Exercises: verbal and tactile cues for strengthening exercises    General Comments        Pertinent Vitals/Pain Pain Assessment: Faces Faces Pain Scale: Hurts a little bit Pain Location: posterior head and neck Pain Descriptors /  Indicators: Discomfort Pain Intervention(s): Repositioned    Home Living                      Prior Function            PT Goals (current goals can now be found in the care plan section) Acute Rehab PT Goals Patient Stated Goal: to move more PT Goal Formulation: With patient Time  For Goal Achievement: 08/19/20 Potential to Achieve Goals: Fair Progress towards PT goals: Progressing toward goals    Frequency    Min 2X/week      PT Plan Current plan remains appropriate    Co-evaluation              AM-PAC PT "6 Clicks" Mobility   Outcome Measure  Help needed turning from your back to your side while in a flat bed without using bedrails?: A Lot Help needed moving from lying on your back to sitting on the side of a flat bed without using bedrails?: A Lot Help needed moving to and from a bed to a chair (including a wheelchair)?: A Lot Help needed standing up from a chair using your arms (e.g., wheelchair or bedside chair)?: A Lot Help needed to walk in hospital room?: Total Help needed climbing 3-5 steps with a railing? : Total 6 Click Score: 10    End of Session   Activity Tolerance: Patient tolerated treatment well Patient left: in bed;with call bell/phone within reach;with bed alarm set Nurse Communication:  (white board up to date with mobility status) PT Visit Diagnosis: Unsteadiness on feet (R26.81);Muscle weakness (generalized) (M62.81);Difficulty in walking, not elsewhere classified (R26.2)     Time: 9622-2979 PT Time Calculation (min) (ACUTE ONLY): 36 min  Charges:  $Therapeutic Exercise: 8-22 mins $Therapeutic Activity: 8-22 mins                     Donna Bernard, PT, MPT    [name] 08/06/2020, 1:35 PM
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
## 2020-08-06 NOTE — Consult Note (Signed)
Pharmacy Antibiotic Note  Chad [name] is a 70 y.o. male admitted on 08/01/2020 with metabolic encephalopathy, hypotension, acute blood loss anemia. Chest XR 4/18: Development of bilateral lower lobe bronchopneumonia, left worse than right. Pharmacy has been consulted for unasyn dosing for possible aspiration PNA  Plan:  Unasyn 3 gram Q6H  Height: 5\' 8"  (172.7 cm) Weight: 76 kg (167 lb 8.8 oz) IBW/kg (Calculated) : 68.4  Temp (24hrs), Avg:97.9 F (36.6 C), Min:97.6 F (36.4 C), Max:98.4 F (36.9 C)  Recent Labs  Lab 08/01/20 1123 08/01/20 1127 08/01/20 1343 08/01/20 1604 08/01/20 1606 08/01/20 2001 08/02/20 0311 08/03/20 0253 08/03/20 0908 08/04/20 0515 08/04/20 1028 08/05/20 0635 08/05/20 1113 08/06/20 0527  WBC  --  4.0  --  5.2  --   --   --   --   --   --  5.3 3.9* 4.2  --   CREATININE  --  0.75  --   --   --   --    < > 0.63  --  0.87 0.74 0.62  --  0.76  LATICACIDVEN 4.6*  --  4.1*  --  8.3* 3.3*  --   --  1.4  --   --   --   --   --    < > = values in this interval not displayed.    Estimated Creatinine Clearance: 86.7 mL/min (by C-G formula based on SCr of 0.76 mg/dL).    Allergies  Allergen Reactions  . Morphine And Related Other (See Comments)    Causes "bad disposition"  . Ibuprofen Other (See Comments)    Gi upset    Antimicrobials this admission: 4/13 ceftriaxone >> 4/14 4/18 Unasny >>   Dose adjustments this admission: n/a  Microbiology results:  Thank you for allowing pharmacy to be a part of this patient's care.  5/18, PharmD, BCPS 08/06/2020 3:41 PM

## 2020-08-07 LAB — COMPREHENSIVE METABOLIC PANEL
ALT: 32 U/L (ref 0–44)
AST: 48 U/L — ABNORMAL HIGH (ref 15–41)
Albumin: 2.2 g/dL — ABNORMAL LOW (ref 3.5–5.0)
Alkaline Phosphatase: 145 U/L — ABNORMAL HIGH (ref 38–126)
Anion gap: 6 (ref 5–15)
BUN: 8 mg/dL (ref 8–23)
CO2: 27 mmol/L (ref 22–32)
Calcium: 8.3 mg/dL — ABNORMAL LOW (ref 8.9–10.3)
Chloride: 106 mmol/L (ref 98–111)
Creatinine, Ser: 0.78 mg/dL (ref 0.61–1.24)
GFR, Estimated: >60 mL/min (ref >60)
Glucose, Bld: 88 mg/dL (ref 70–99)
Potassium: 4.4 mmol/L (ref 3.5–5.1)
Sodium: 139 mmol/L (ref 135–145)
Total Bilirubin: 0.7 mg/dL (ref 0.3–1.2)
Total Protein: 4.8 g/dL — ABNORMAL LOW (ref 6.5–8.1)

## 2020-08-07 LAB — CBC
HCT: 35.3 % — ABNORMAL LOW (ref 39.0–52.0)
Hemoglobin: 11.6 g/dL — ABNORMAL LOW (ref 13.0–17.0)
MCH: 32 pg (ref 26.0–34.0)
MCHC: 32.9 g/dL (ref 30.0–36.0)
MCV: 97.5 fL (ref 80.0–100.0)
Platelets: 139 10*3/uL — ABNORMAL LOW (ref 150–400)
RBC: 3.62 MIL/uL — ABNORMAL LOW (ref 4.22–5.81)
RDW: 17.1 % — ABNORMAL HIGH (ref 11.5–15.5)
WBC: 4.1 10*3/uL (ref 4.0–10.5)
nRBC: 0 % (ref 0.0–0.2)

## 2020-08-07 MED ORDER — DIAZEPAM 2 MG PO TABS
2.0000 mg | ORAL_TABLET | Freq: Two times a day (BID) | ORAL | Status: DC
  Administered 2020-08-07 – 2020-08-14 (×15): 2 mg via ORAL
  Filled 2020-08-07 (×15): qty 1

## 2020-08-07 NOTE — TOC Progression Note (Signed)
Transition of Care [facility]) - Progression Note    Patient Details  Name: Chad [name] MRN: [id number] Date of Birth: 04-19-1951  [facility] CM/SW Contact  Hetty Ely, RN Phone Number: 08/07/2020, 12:28 PM  Clinical Narrative: Spoke with patient about SNF bed acceptance at [facility]. Patient request address, which was given. Patient receptive to rehabilitation at the facility. Patient informed of the process and need to get English as a second language teacher.      Expected Discharge Plan: Skilled Nursing Facility Barriers to Discharge: Continued Medical Work up  Expected Discharge Plan and Services Expected Discharge Plan: Skilled Nursing Facility In-house Referral: Clinical Social Work   Post Acute Care Choice: Skilled Nursing Facility (cain , walker) Living arrangements for the past 2 months: Apartment                                       Social Determinants of Health (SDOH) Interventions    Readmission Risk Interventions No flowsheet data found.

## 2020-08-07 NOTE — Progress Notes (Addendum)
PROGRESS NOTE    Chad [name]  AOZ:[id number] DOB: [birth year] DOA: 08/01/2020 PCP: Rayetta Humphrey, MD  Brief Narrative: 69/M w history of alcoholism, chronic back pain, liver cirrhosis, Barrett's esophagus, peptic ulcer disease, pneumonia presented to the ED on 4/13 with chest pain, subsequently he was poorly responsive with melena, acute blood loss anemia, hypotension and tachycardia. -He was admitted to the ICU with hypotension, acute blood loss anemia.  -Stabilized with fluid resuscitation and blood transfusions -Underwent an endoscopy 4/14 which showed gastric and duodenal ulcers which were nonbleeding -Subsequently transferred to [facility] service 4/16 [facility] course further complicated by alcohol withdrawal and aspiration pneumonia  Assessment & Plan:   Hemorrhagic shock Gastric and duodenal ulcers -Likely secondary to alcoholism, thankfully does not have varices -Endoscopy 4/14 noted nonbleeding gastric and duodenal ulcers -Treated with fluid resuscitation and transfused 2 units of PRBC this admission -Blood pressure and hemoglobin is stable now -PT OT, ambulate as tolerated -Discharge planning  Aspiration pneumonia -On 4/18 developed worsening cough, chest x-ray with new bibasilar infiltrates, suspect aspiration in the setting of alcohol withdrawal and sedation -Started on IV Unasyn yesterday, clinically improving -Transition to oral Augmentin  Alcoholic liver disease Severe hypoalbuminemia Severe hepatic steatosis noted on CT, could have cirrhosis as well considering the degree of hypoalbuminemia  Alcohol withdrawal Heavy alcohol abuse -Reportedly drinks 1 to 2 pints of hard liquor daily -Had significant tremors and anxiety, diaphoresis etc. concerning for EtOH withdrawal -Treated with scheduled Valium and Ativan per CIWA protocol -Improving now -Continue thiamine, folate -Ambulate, PT eval, SNF recommended  Hyponatremia Hypomagnesemia -Replace and  monitor  Chronic anemia and Thrombocytopenia -Chronic, likely secondary to alcoholism and cirrhosis  Positive Hep C Ab  DVT prophylaxis: SCDs due to chronic thrombocytopenia Code Status: Full code Family Communication: No family at bedside Disposition Plan:  Status is: Inpatient  Remains inpatient appropriate because:Inpatient level of care appropriate due to severity of illness   Dispo: The patient is from: Home              Anticipated d/c is to: SNF              Patient currently is not medically stable to d/c.   Difficult to place patient No  Consultants:   Gastroenterology  PCCM transfer   Procedures: EGD 4/14  Antimicrobials:    Subjective: -Cough improving, still with some tremors but overall better  -Talking to the social worker about rehab  Objective: Vitals:   08/07/20 0000 08/07/20 0400 08/07/20 0744 08/07/20 1142  BP: 110/68  137/90 118/72  Pulse: 72  65 73  Resp:   18 17  Temp:   97.9 F (36.6 C) 97.6 F (36.4 C)  TempSrc:      SpO2:   98% 97%  Weight:  74.3 kg    Height:        Intake/Output Summary (Last 24 hours) at 08/07/2020 1448 Last data filed at 08/07/2020 1418 Gross per 24 hour  Intake 1809.04 ml  Output 2800 ml  Net -990.96 ml   Filed Weights   08/02/20 0500 08/03/20 0500 08/07/20 0400  Weight: 72.9 kg 76 kg 74.3 kg    Examination:  General exam: Chronically ill disheveled male sitting up in bed, awake alert oriented to self and place, more lucid today CVS: S1-S2, regular rate rhythm Lungs: Poor air movement bilaterally, few basilar rhonchi Abdomen: Soft, nontender, bowel sounds present Extremities: Trace edema Neuro: Moves all extremities, decreased sensations in both lower legs Psych: Poor insight  and flat affect  Data Reviewed:   CBC: Recent Labs  Lab 08/01/20 1127 08/01/20 1604 08/01/20 2001 08/04/20 0515 08/04/20 1028 08/05/20 0635 08/05/20 1113 08/07/20 0613  WBC 4.0 5.2  --   --  5.3 3.9* 4.2 4.1   NEUTROABS 2.6  --   --   --   --   --   --   --   HGB 12.2* 8.5*   < > 10.9* 10.4* 10.6* 10.9* 11.6*  HCT 35.5* 26.2*   < > 32.8* 31.1* 31.6* 32.2* 35.3*  MCV 95.7 100.4*  --   --  94.8 96.9 95.8 97.5  PLT 85* 85*  --   --  89* 93* 100* 139*   < > = values in this interval not displayed.   Basic Metabolic Panel: Recent Labs  Lab 08/02/20 0311 08/03/20 0253 08/04/20 0515 08/04/20 1028 08/05/20 0635 08/06/20 0527 08/07/20 0613  NA 134* 135 133* 134* 139 135 139  K 4.0 4.5 4.1 3.9 3.9 3.9 4.4  CL 102 106 102 103 105 103 106  CO2 25 23 24 23 26 25 27   GLUCOSE 151* 119* 96 107* 88 103* 88  BUN 8 8 9 9  6* 6* 8  CREATININE 0.74 0.63 0.87 0.74 0.62 0.76 0.78  CALCIUM 6.8* 7.5* 7.5* 7.6* 7.9* 8.1* 8.3*  MG 2.1 2.1 1.9 1.7 2.0 2.0  --   PHOS 3.4 4.6 3.5 3.8  --  4.2  --    GFR: Estimated Creatinine Clearance: 86.7 mL/min (by C-G formula based on SCr of 0.78 mg/dL). Liver Function Tests: Recent Labs  Lab 08/02/20 0311 08/03/20 0253 08/04/20 1028 08/05/20 0635 08/06/20 0527 08/07/20 0613  AST 82* 109* 88* 70*  --  48*  ALT 35 41 42 36  --  32  ALKPHOS 120 141* 133* 130*  --  145*  BILITOT 0.9 1.2 0.9 1.1  --  0.7  PROT 3.5* 4.2* 4.5* 4.5*  --  4.8*  ALBUMIN 1.8* 2.0* 2.1* 2.1* 2.3* 2.2*   Recent Labs  Lab 08/01/20 1127  LIPASE 25   No results for input(s): AMMONIA in the last 168 hours. Coagulation Profile: Recent Labs  Lab 08/01/20 1127  INR 1.0   Cardiac Enzymes: Recent Labs  Lab 08/01/20 1604  CKTOTAL 52   BNP (last 3 results) No results for input(s): PROBNP in the last 8760 hours. HbA1C: No results for input(s): HGBA1C in the last 72 hours. CBG: No results for input(s): GLUCAP in the last 168 hours. Lipid Profile: No results for input(s): CHOL, HDL, LDLCALC, TRIG, CHOLHDL, LDLDIRECT in the last 72 hours. Thyroid Function Tests: No results for input(s): TSH, T4TOTAL, FREET4, T3FREE, THYROIDAB in the last 72 hours. Anemia Panel: No results for  input(s): VITAMINB12, FOLATE, FERRITIN, TIBC, IRON, RETICCTPCT in the last 72 hours. Urine analysis:    Component Value Date/Time   COLORURINE YELLOW (A) 08/01/2020 1343   APPEARANCEUR CLEAR (A) 08/01/2020 1343   LABSPEC 1.003 (L) 08/01/2020 1343   PHURINE 8.0 08/01/2020 1343   GLUCOSEU NEGATIVE 08/01/2020 1343   HGBUR NEGATIVE 08/01/2020 1343   BILIRUBINUR NEGATIVE 08/01/2020 1343   KETONESUR NEGATIVE 08/01/2020 1343   PROTEINUR NEGATIVE 08/01/2020 1343   NITRITE NEGATIVE 08/01/2020 1343   LEUKOCYTESUR NEGATIVE 08/01/2020 1343   Sepsis Labs: @LABRCNTIP (procalcitonin:4,lacticidven:4)  ) Recent Results (from the past 240 hour(s))  Resp Panel by RT-PCR (Flu A&B, Covid) Nasopharyngeal Swab     Status: None   Collection Time: 08/01/20  1:43 PM  Specimen: Nasopharyngeal Swab; Nasopharyngeal(NP) swabs in vial transport medium  Result Value Ref Range Status   SARS Coronavirus 2 by RT PCR NEGATIVE NEGATIVE Final    Comment: (NOTE) SARS-CoV-2 target nucleic acids are NOT DETECTED.  The SARS-CoV-2 RNA is generally detectable in upper respiratory specimens during the acute phase of infection. The lowest concentration of SARS-CoV-2 viral copies this assay can detect is 138 copies/mL. A negative result does not preclude SARS-Cov-2 infection and should not be used as the sole basis for treatment or other patient management decisions. A negative result may occur with  improper specimen collection/handling, submission of specimen other than nasopharyngeal swab, presence of viral mutation(s) within the areas targeted by this assay, and inadequate number of viral copies(<138 copies/mL). A negative result must be combined with clinical observations, patient history, and epidemiological information. The expected result is Negative.  Fact Sheet for Patients:  BloggerCourse.com  Fact Sheet for Healthcare Providers:  SeriousBroker.it  This  test is no t yet approved or cleared by the [place] FDA and  has been authorized for detection and/or diagnosis of SARS-CoV-2 by FDA under an Emergency Use Authorization (EUA). This EUA will remain  in effect (meaning this test can be used) for the duration of the COVID-19 declaration under Section 564(b)(1) of the Act, 21 U.S.C.section 360bbb-3(b)(1), unless the authorization is terminated  or revoked sooner.       Influenza A by PCR NEGATIVE NEGATIVE Final   Influenza B by PCR NEGATIVE NEGATIVE Final    Comment: (NOTE) The Xpert Xpress SARS-CoV-2/FLU/RSV plus assay is intended as an aid in the diagnosis of influenza from Nasopharyngeal swab specimens and should not be used as a sole basis for treatment. Nasal washings and aspirates are unacceptable for Xpert Xpress SARS-CoV-2/FLU/RSV testing.  Fact Sheet for Patients: BloggerCourse.com  Fact Sheet for Healthcare Providers: SeriousBroker.it  This test is not yet approved or cleared by the [place] FDA and has been authorized for detection and/or diagnosis of SARS-CoV-2 by FDA under an Emergency Use Authorization (EUA). This EUA will remain in effect (meaning this test can be used) for the duration of the COVID-19 declaration under Section 564(b)(1) of the Act, 21 U.S.C. section 360bbb-3(b)(1), unless the authorization is terminated or revoked.  Performed at [facility], [address] [place], [place] [address]      Scheduled Meds: . diazepam  2 mg Oral BID  . folic acid  1 mg Oral Daily  . nicotine  14 mg Transdermal Daily  . pantoprazole  40 mg Oral Daily  . thiamine  100 mg Oral Daily   Continuous Infusions: . sodium chloride    . ampicillin-sulbactam (UNASYN) IV Stopped (08/07/20 1208)     LOS: 6 days   Time spent:  Zannie Cove, MD [facility] 08/07/2020, 2:48 PM

## 2020-08-08 DIAGNOSIS — J690 Pneumonitis due to inhalation of food and vomit: Secondary | ICD-10-CM

## 2020-08-08 DIAGNOSIS — D649 Anemia, unspecified: Secondary | ICD-10-CM

## 2020-08-08 LAB — CBC
HCT: 33.4 % — ABNORMAL LOW (ref 39.0–52.0)
Hemoglobin: 10.7 g/dL — ABNORMAL LOW (ref 13.0–17.0)
MCH: 31.4 pg (ref 26.0–34.0)
MCHC: 32 g/dL (ref 30.0–36.0)
MCV: 97.9 fL (ref 80.0–100.0)
Platelets: 164 10*3/uL (ref 150–400)
RBC: 3.41 MIL/uL — ABNORMAL LOW (ref 4.22–5.81)
RDW: 16.9 % — ABNORMAL HIGH (ref 11.5–15.5)
WBC: 5.2 10*3/uL (ref 4.0–10.5)
nRBC: 0 % (ref 0.0–0.2)

## 2020-08-08 MED ORDER — ALPRAZOLAM 0.5 MG PO TABS
0.2500 mg | ORAL_TABLET | Freq: Two times a day (BID) | ORAL | Status: DC | PRN
  Administered 2020-08-08 – 2020-08-11 (×6): 0.25 mg via ORAL
  Filled 2020-08-08 (×7): qty 1

## 2020-08-08 NOTE — Progress Notes (Signed)
PROGRESS NOTE    Chad [name]  VEH:[id number] DOB: Jul 22, 1950 DOA: 08/01/2020 PCP: Rayetta Humphrey, MD   Assessment & Plan:   Active Problems:   RUQ pain   Acute GI bleeding   Blood in stool   Duodenal ulcer without hemorrhage or perforation  Hemorrhagic shock: likely secondary to alcohol abuse & hx of gastric & duodenal ulcers. S/p 2 units of pRBCs transfused. Resolved  Gastric & duodenal ulcers: likely secondary to alcohol abuse. S/p EGD on 08/02/20 which showed nonbleeding gastric & duodenal ulcers. S/p 2 units pRBCs transfused.  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 Will continue to monitor   Alcohol withdrawal: drinks 1-2 pints of liquor daily. Continue on CIWA protocol. Alcohol cessation counseling. Continue on thiamine, folate   Hyponatremia: resolved  Hypomagnesemia: WNL yesterday   Thrombocytopenia: resolved  Normocytic anemia: possibly secondary to alcohol abuse. Will continue to monitor    DVT prophylaxis: SCDs Code Status: full  Family Communication:  Disposition Plan: likely d/c to SNF   Level of care: Progressive Cardiac   Status is: Inpatient  Remains inpatient appropriate because:Unsafe d/c plan, IV treatments appropriate due to intensity of illness or inability to take PO and Inpatient level of care appropriate due to severity of illness   Dispo: The patient is from: Home              Anticipated d/c is to: SNF              Patient currently is medically stable to d/c.   Difficult to place patient No        Consultants:   GI    Procedures: EGD   Antimicrobials: unasyn    Subjective: Pt c/o weakness   Objective: Vitals:   08/07/20 1142 08/07/20 2124 08/08/20 0458 08/08/20 0752  BP: 118/72 100/66 138/77 121/60  Pulse: 73 72 70 87  Resp: 17 17 18 17   Temp: 97.6 F (36.4 C) 98.2 F (36.8 C)  98 F (36.7 C) (!) 97.5 F (36.4 C)  TempSrc:  Oral Oral   SpO2: 97% 97% 94% 99%  Weight:      Height:        Intake/Output Summary (Last 24 hours) at 08/08/2020 0824 Last data filed at 08/08/2020 0752 Gross per 24 hour  Intake 665.23 ml  Output 850 ml  Net -184.77 ml   Filed Weights   08/02/20 0500 08/03/20 0500 08/07/20 0400  Weight: 72.9 kg 76 kg 74.3 kg    Examination:  General exam: Appears calm but uncomfortable. Appears disheveled  Respiratory system: Clear to auscultation. Respiratory effort normal. Cardiovascular system: S1 & S2 +. No rubs, gallops or clicks.  Gastrointestinal system: Abdomen is nondistended, soft and nontender. Normal bowel sounds heard. Central nervous system: Alert and oriented. Moves all extremities  Psychiatry: Judgement and insight appear normal. Flat mood and affect     Data Reviewed: I have personally reviewed following labs and imaging studies  CBC: Recent Labs  Lab 08/01/20 1127 08/01/20 1604 08/04/20 1028 08/05/20 0635 08/05/20 1113 08/07/20 0613 08/08/20 0501  WBC 4.0   < > 5.3 3.9* 4.2 4.1 5.2  NEUTROABS 2.6  --   --   --   --   --   --   HGB 12.2*   < > 10.4* 10.6* 10.9* 11.6* 10.7*  HCT 35.5*   < > 31.1* 31.6* 32.2* 35.3* 33.4*  MCV 95.7   < > 94.8 96.9 95.8 97.5 97.9  PLT 85*   < > 89* 93* 100* 139* 164   < > = values in this interval not displayed.   Basic Metabolic Panel: Recent Labs  Lab 08/02/20 0311 08/03/20 0253 08/04/20 0515 08/04/20 1028 08/05/20 0635 08/06/20 0527 08/07/20 0613  NA 134* 135 133* 134* 139 135 139  K 4.0 4.5 4.1 3.9 3.9 3.9 4.4  CL 102 106 102 103 105 103 106  CO2 25 23 24 23 26 25 27   GLUCOSE 151* 119* 96 107* 88 103* 88  BUN 8 8 9 9  6* 6* 8  CREATININE 0.74 0.63 0.87 0.74 0.62 0.76 0.78  CALCIUM 6.8* 7.5* 7.5* 7.6* 7.9* 8.1* 8.3*  MG 2.1 2.1 1.9 1.7 2.0 2.0  --   PHOS 3.4 4.6 3.5 3.8  --  4.2  --    GFR: Estimated Creatinine Clearance: 86.7 mL/min (by C-G formula based on SCr of  0.78 mg/dL). Liver Function Tests: Recent Labs  Lab 08/02/20 0311 08/03/20 0253 08/04/20 1028 08/05/20 0635 08/06/20 0527 08/07/20 0613  AST 82* 109* 88* 70*  --  48*  ALT 35 41 42 36  --  32  ALKPHOS 120 141* 133* 130*  --  145*  BILITOT 0.9 1.2 0.9 1.1  --  0.7  PROT 3.5* 4.2* 4.5* 4.5*  --  4.8*  ALBUMIN 1.8* 2.0* 2.1* 2.1* 2.3* 2.2*   Recent Labs  Lab 08/01/20 1127  LIPASE 25   No results for input(s): AMMONIA in the last 168 hours. Coagulation Profile: Recent Labs  Lab 08/01/20 1127  INR 1.0   Cardiac Enzymes: Recent Labs  Lab 08/01/20 1604  CKTOTAL 52   BNP (last 3 results) No results for input(s): PROBNP in the last 8760 hours. HbA1C: No results for input(s): HGBA1C in the last 72 hours. CBG: No results for input(s): GLUCAP in the last 168 hours. Lipid Profile: No results for input(s): CHOL, HDL, LDLCALC, TRIG, CHOLHDL, LDLDIRECT in the last 72 hours. Thyroid Function Tests: No results for input(s): TSH, T4TOTAL, FREET4, T3FREE, THYROIDAB in the last 72 hours. Anemia Panel: No results for input(s): VITAMINB12, FOLATE, FERRITIN, TIBC, IRON, RETICCTPCT in the last 72 hours. Sepsis Labs: Recent Labs  Lab 08/01/20 1343 08/01/20 1606 08/01/20 2001 08/03/20 0908  LATICACIDVEN 4.1* 8.3* 3.3* 1.4    Recent Results (from the past 240 hour(s))  Resp Panel by RT-PCR (Flu A&B, Covid) Nasopharyngeal Swab     Status: None   Collection Time: 08/01/20  1:43 PM   Specimen: Nasopharyngeal Swab; Nasopharyngeal(NP) swabs in vial transport medium  Result Value Ref Range Status   SARS Coronavirus 2 by RT PCR NEGATIVE NEGATIVE Final    Comment: (NOTE) SARS-CoV-2 target nucleic acids are NOT DETECTED.  The SARS-CoV-2 RNA is generally detectable in upper respiratory specimens during the acute phase of infection. The lowest concentration of SARS-CoV-2 viral copies this assay can detect is 138 copies/mL. A negative result does not preclude SARS-Cov-2 infection and  should not be used as the sole basis for treatment or other patient management decisions. A negative result may occur with  improper specimen collection/handling, submission of specimen other than nasopharyngeal swab, presence of viral mutation(s) within the areas targeted by this assay, and inadequate number of viral copies(<138 copies/mL). A negative result must be combined with clinical observations, patient history, and epidemiological information. The expected result is Negative.  Fact Sheet for Patients:  08/05/20  Fact Sheet for Healthcare  Providers:  SeriousBroker.it  This test is no t yet approved or cleared by the [place] and  has been authorized for detection and/or diagnosis of SARS-CoV-2 by FDA under an Emergency Use Authorization (EUA). This EUA will remain  in effect (meaning this test can be used) for the duration of the COVID-19 declaration under Section 564(b)(1) of the Act, 21 U.S.C.section 360bbb-3(b)(1), unless the authorization is terminated  or revoked sooner.       Influenza A by PCR NEGATIVE NEGATIVE Final   Influenza B by PCR NEGATIVE NEGATIVE Final    Comment: (NOTE) The Xpert Xpress SARS-CoV-2/FLU/RSV plus assay is intended as an aid in the diagnosis of influenza from Nasopharyngeal swab specimens and should not be used as a sole basis for treatment. Nasal washings and aspirates are unacceptable for Xpert Xpress SARS-CoV-2/FLU/RSV testing.  Fact Sheet for Patients: BloggerCourse.com  Fact Sheet for Healthcare Providers: SeriousBroker.it  This test is not yet approved or cleared by the [place] FDA and has been authorized for detection and/or diagnosis of SARS-CoV-2 by FDA under an Emergency Use Authorization (EUA). This EUA will remain in effect (meaning this test can be used) for the duration of the COVID-19 declaration  under Section 564(b)(1) of the Act, 21 U.S.C. section 360bbb-3(b)(1), unless the authorization is terminated or revoked.  Performed at [facility], [address], [place], [place] [address]          Radiology Studies: DG Chest [place] 1 View  Result Date: 08/06/2020 CLINICAL DATA:  Productive cough and chest pain EXAM: PORTABLE CHEST 1 VIEW COMPARISON:  08/01/2020 FINDINGS: Patient has developed bilateral lower lobe bronchopneumonia, more extensive on the left than the right. Left lower lobe volume loss and left effusion also present. No significant bone finding. Mild aortic atherosclerotic calcification is noted. IMPRESSION: Development of bilateral lower lobe bronchopneumonia, left worse than right. Left lower lobe volume loss and left effusion. Electronically Signed   By: Paulina Fusi M.D.   On: 08/06/2020 10:30        Scheduled Meds: . diazepam  2 mg Oral BID  . folic acid  1 mg Oral Daily  . nicotine  14 mg Transdermal Daily  . pantoprazole  40 mg Oral Daily  . thiamine  100 mg Oral Daily   Continuous Infusions: . sodium chloride    . ampicillin-sulbactam (UNASYN) IV 3 g (08/08/20 0515)     LOS: 7 days    Time spent: 33 mins     Charise Killian, MD [facility] Pager [phone]  If 7PM-7AM, please contact night-coverage 08/08/2020, 8:24 AM

## 2020-08-08 NOTE — Progress Notes (Signed)
This nurse went to patient room at 17:18 and give him pain medication that he has for, patient said he was wet I check the purwick that was in place but I notice that his gown was wet and I told that I need to get some so we can change all the  linen, I saw Tommi Rumps zhang in the hallway who told me that my patient in 245 need to transfer to ICU, I immediately when to 243 to see the patient and get some help to transfer patient to ICU. As I was taking to to ICU I heard MS Glick calling for help I told him that I haven forget about jim but I have an emergency with another and that I will let the nurse tech know, When I   when back to his room he was very upset and that he felt like I have neglected him and that he will filled a complaint .

## 2020-08-08 NOTE — Progress Notes (Signed)
Mobility Specialist - Progress Note   08/08/20 1400  Mobility  Activity Repositioned in chair (bed)  Level of Assistance Moderate assist, patient does 50-74%  Assistive Device None  Distance Ambulated (ft) 0 ft  Mobility Response Tolerated well  Mobility performed by Mobility specialist  $Mobility charge 1 Mobility    Pt c/o pain in L hip, R ankle, head, and neck upon arrival. States he is unable to walk. Pt repositioned in bed for comfort, unsuccessful at sitting up without assist. Further activity limited d/t lunch entering and pt requesting to return at a later time.    Filiberto Pinks Mobility Specialist 08/08/20, 2:17 PM

## 2020-08-08 NOTE — Progress Notes (Signed)
Physical Therapy Treatment Patient Details Name: Chad [name] MRN: [id number] DOB: Oct 01, 1950 Today's Date: 08/08/2020    History of Present Illness 70 year old male with alcohol abuse who presented with right upper lobe quadrant abdominal pain, in the emergency department he has large amount of black tarry stool followed by he became hypotensive, tachycardic and pale    PT Comments    Pt was supine in bed with HOB elevate ~ 10 degrees. Agrees to PT session with encouragement. Does endorse feeling depressed. " Do you think they will let me go to Psych hospital downstairs." Recommended pt discuss with MD. He eventually agrees to OOB activity. Required min assist to exit bed, stand, and ambulate. Ambulated 2 x 15 ft with RW. He endorses fatigue with minimal activity. Will greatly benefit from SNF at [place] to address deficits while improving independence with ADLs. Pt is agreeable to SNF.   Follow Up Recommendations  SNF     Equipment Recommendations  Other (comment) (defer to next level of care)       Precautions / Restrictions Precautions Precautions: Fall Restrictions Weight Bearing Restrictions: No    Mobility  Bed Mobility Overal bed mobility: Needs Assistance Bed Mobility: Supine to Sit;Sit to Supine     Supine to sit: Min assist Sit to supine: Min assist   General bed mobility comments: MIn assist to exit R sid eof bed. min assist to progress back to long sitting in bed after OOB activity    Transfers Overall transfer level: Needs assistance Equipment used: Rolling walker (2 wheeled) Transfers: Sit to/from Stand Sit to Stand: Min assist         General transfer comment: MIn assist  Ambulation/Gait Ambulation/Gait assistance: Min guard Gait Distance (Feet): 15 Feet Assistive device: Rolling walker (2 wheeled) Gait Pattern/deviations: Step-through pattern Gait velocity: decreased   General Gait Details: Pt was able to ambulate 2 x 15 ft around bed with RW  without LOB. Does c/o severe fatigue with minimal activity.       Balance Overall balance assessment: Needs assistance Sitting-balance support: Bilateral upper extremity supported;Feet supported Sitting balance-Leahy Scale: Good     Standing balance support: During functional activity Standing balance-Leahy Scale: Fair Standing balance comment: reliant on BUE support      Cognition Arousal/Alertness: Awake/alert Behavior During Therapy: WFL for tasks assessed/performed Overall Cognitive Status: History of cognitive impairments - at baseline    Following Commands: Follows one step commands consistently Safety/Judgement: Decreased awareness of safety;Decreased awareness of deficits Awareness: Intellectual Problem Solving: Slow processing;Requires verbal cues General Comments: Pt was A and O x 3. Does endorse feelings of depression. Asked about being transferred to psych unit downstairs.             Pertinent Vitals/Pain Pain Assessment: No/denies pain Pain Score: 0-No pain           PT Goals (current goals can now be found in the care plan section) Acute Rehab PT Goals Patient Stated Goal: rehab then home Progress towards PT goals: Progressing toward goals    Frequency    Min 2X/week      PT Plan Current plan remains appropriate    Co-evaluation     PT goals addressed during session: Proper use of DME;Balance;Mobility/safety with mobility;Strengthening/ROM        AM-PAC PT "6 Clicks" Mobility   Outcome Measure  Help needed turning from your back to your side while in a flat bed without using bedrails?: A Little Help needed moving from lying on your  back to sitting on the side of a flat bed without using bedrails?: A Little Help needed moving to and from a bed to a chair (including a wheelchair)?: A Little Help needed standing up from a chair using your arms (e.g., wheelchair or bedside chair)?: A Little Help needed to walk in hospital room?: A  Little Help needed climbing 3-5 steps with a railing? : A Little 6 Click Score: 18    End of Session Equipment Utilized During Treatment: Gait belt Activity Tolerance: Patient tolerated treatment well Patient left: in bed;with call bell/phone within reach;with bed alarm set Nurse Communication: Mobility status PT Visit Diagnosis: Unsteadiness on feet (R26.81);Muscle weakness (generalized) (M62.81);Difficulty in walking, not elsewhere classified (R26.2)     Time: 5009-3818 PT Time Calculation (min) (ACUTE ONLY): 23 min  Charges:  $Gait Training: 8-22 mins $Therapeutic Activity: 8-22 mins                     Jetta Lout PTA 08/08/20, 3:44 PM

## 2020-08-09 ENCOUNTER — Inpatient Hospital Stay: Payer: Medicare HMO

## 2020-08-09 DIAGNOSIS — K253 Acute gastric ulcer without hemorrhage or perforation: Secondary | ICD-10-CM

## 2020-08-09 DIAGNOSIS — D539 Nutritional anemia, unspecified: Secondary | ICD-10-CM

## 2020-08-09 LAB — CBC
HCT: 35.7 % — ABNORMAL LOW (ref 39.0–52.0)
Hemoglobin: 11.3 g/dL — ABNORMAL LOW (ref 13.0–17.0)
MCH: 31.7 pg (ref 26.0–34.0)
MCHC: 31.7 g/dL (ref 30.0–36.0)
MCV: 100.3 fL — ABNORMAL HIGH (ref 80.0–100.0)
Platelets: 180 10*3/uL (ref 150–400)
RBC: 3.56 MIL/uL — ABNORMAL LOW (ref 4.22–5.81)
RDW: 16.4 % — ABNORMAL HIGH (ref 11.5–15.5)
WBC: 4.4 10*3/uL (ref 4.0–10.5)
nRBC: 0 % (ref 0.0–0.2)

## 2020-08-09 LAB — COMPREHENSIVE METABOLIC PANEL
ALT: 26 U/L (ref 0–44)
AST: 41 U/L (ref 15–41)
Albumin: 2.4 g/dL — ABNORMAL LOW (ref 3.5–5.0)
Alkaline Phosphatase: 139 U/L — ABNORMAL HIGH (ref 38–126)
Anion gap: 9 (ref 5–15)
BUN: 12 mg/dL (ref 8–23)
CO2: 24 mmol/L (ref 22–32)
Calcium: 8.5 mg/dL — ABNORMAL LOW (ref 8.9–10.3)
Chloride: 104 mmol/L (ref 98–111)
Creatinine, Ser: 0.77 mg/dL (ref 0.61–1.24)
GFR, Estimated: >60 mL/min (ref >60)
Glucose, Bld: 90 mg/dL (ref 70–99)
Potassium: 4.1 mmol/L (ref 3.5–5.1)
Sodium: 137 mmol/L (ref 135–145)
Total Bilirubin: 0.7 mg/dL (ref 0.3–1.2)
Total Protein: 5.3 g/dL — ABNORMAL LOW (ref 6.5–8.1)

## 2020-08-09 LAB — RESP PANEL BY RT-PCR (FLU A&B, COVID) ARPGX2
Influenza A by PCR: NEGATIVE
Influenza B by PCR: NEGATIVE
SARS Coronavirus 2 by RT PCR: NEGATIVE

## 2020-08-09 MED ORDER — DIPHENHYDRAMINE-ZINC ACETATE 2-0.1 % EX CREA
TOPICAL_CREAM | Freq: Every day | CUTANEOUS | Status: DC | PRN
  Filled 2020-08-09 (×2): qty 28

## 2020-08-09 MED ORDER — AMOXICILLIN-POT CLAVULANATE 875-125 MG PO TABS: 1.0000 | ORAL_TABLET | Freq: Two times a day (BID) | ORAL | 0 refills | Status: DC

## 2020-08-09 MED ORDER — POLYVINYL ALCOHOL 1.4 % OP SOLN
1.0000 [drp] | OPHTHALMIC | Status: DC | PRN
  Administered 2020-08-09 – 2020-08-10 (×3): 1 [drp] via OPHTHALMIC
  Filled 2020-08-09 (×2): qty 15

## 2020-08-09 MED ORDER — ENSURE ENLIVE PO LIQD
237.0000 mL | ORAL | Status: DC
  Administered 2020-08-09 – 2020-08-13 (×5): 237 mL via ORAL

## 2020-08-09 MED ORDER — ADULT MULTIVITAMIN W/MINERALS CH
1.0000 | ORAL_TABLET | Freq: Every day | ORAL | Status: DC
  Administered 2020-08-09 – 2020-08-14 (×6): 1 via ORAL
  Filled 2020-08-09 (×6): qty 1

## 2020-08-09 MED ORDER — GUAIFENESIN-DM 100-10 MG/5ML PO SYRP
5.0000 mL | ORAL_SOLUTION | ORAL | Status: DC | PRN
  Administered 2020-08-09 – 2020-08-14 (×9): 5 mL via ORAL
  Filled 2020-08-09 (×9): qty 5

## 2020-08-09 NOTE — Progress Notes (Deleted)
Pt complaining of Shortness of breath. Labored breathing noted. o2 sats 93% on 3L. MD ordered 60mg  IV. Lasix administered. Will continue to monitor.

## 2020-08-09 NOTE — Progress Notes (Signed)
Occupational Therapy Treatment Patient Details Name: Chad [name] MRN: [id number] DOB: June 26, 1950 Today's Date: 08/09/2020    History of present illness 70 year old male with alcohol abuse who presented with right upper lobe quadrant abdominal pain, in the emergency department he has large amount of black tarry stool followed by he became hypotensive, tachycardic and pale   OT comments  Upon entering the room, pt's bed alarm going off and pt is attempting to get to EOB. OT provides min guard and min cuing for pt to perform supine >sit and pt able to use urinal while seated EOB. Pt noted to have L lateral lean while seated and needing mod multimodal cuing to return to midline this session. Focus on B UE strengthening exercises with pursed lip breathing and while maintaining midline orientation. OT demonstrated how to pull towel taut for 2 sets fo 10 forwards rows, chest presses, and straight arm raises. Pt returning demonstrations but displaying more L lateral lean as he fatigues. Pt returning to supine with min guard and from flat bed pt cued for bridging and use of UEs to scoot self up in bed with increased time. Pt remained in bed at end of session with all needs within reach. Pt very pleasant and cooperative throughout.    Follow Up Recommendations  SNF    Equipment Recommendations  Other (comment) (defer to next venue of care)       Precautions / Restrictions Precautions Precautions: Fall       Mobility Bed Mobility Overal bed mobility: Needs Assistance Bed Mobility: Supine to Sit;Sit to Supine     Supine to sit: Min guard Sit to supine: Min guard        Transfers Overall transfer level: Needs assistance Equipment used: Rolling walker (2 wheeled) Transfers: Sit to/from Stand Sit to Stand: Min assist              Balance Overall balance assessment: Needs assistance Sitting-balance support: Bilateral upper extremity supported;Feet supported Sitting balance-Leahy  Scale: Fair Sitting balance - Comments: needing cuing for midline orientation                                   ADL either performed or assessed with clinical judgement   ADL Overall ADL's : Needs assistance/impaired                                       General ADL Comments: Pt able to manage urinal with supervision and min cuing for technique while seated EOB     Vision Baseline Vision/History: Wears glasses Patient Visual Report: No change from baseline            Cognition Arousal/Alertness: Awake/alert Behavior During Therapy: WFL for tasks assessed/performed Overall Cognitive Status: History of cognitive impairments - at baseline                         Following Commands: Follows one step commands consistently Safety/Judgement: Decreased awareness of safety;Decreased awareness of deficits Awareness: Intellectual Problem Solving: Slow processing;Requires verbal cues General Comments: Pt is A and O x3 and pleasant this session. Pt attempting to get OOB with bed alarm going off as therapist enters the room.                   Pertinent Vitals/ Pain  Pain Assessment: No/denies pain         Frequency  Min 1X/week        Progress Toward Goals  OT Goals(current goals can now be found in the care plan section)  Progress towards OT goals: Progressing toward goals  Acute Rehab OT Goals Patient Stated Goal: rehab then home OT Goal Formulation: With patient Time For Goal Achievement: 08/19/20 Potential to Achieve Goals: Good  Plan Discharge plan remains appropriate       AM-PAC OT "6 Clicks" Daily Activity     Outcome Measure   Help from another person eating meals?: None Help from another person taking care of personal grooming?: A Lot Help from another person toileting, which includes using toliet, bedpan, or urinal?: A Lot Help from another person bathing (including washing, rinsing, drying)?: A Lot Help  from another person to put on and taking off regular upper body clothing?: A Lot Help from another person to put on and taking off regular lower body clothing?: A Lot 6 Click Score: 14    End of Session    OT Visit Diagnosis: Other abnormalities of gait and mobility (R26.89);Muscle weakness (generalized) (M62.81)   Activity Tolerance Patient tolerated treatment well   Patient Left in bed;with call bell/phone within reach;with bed alarm set;with nursing/sitter in room   Nurse Communication Mobility status        Time: 0945-1010 OT Time Calculation (min): 25 min  Charges: OT General Charges $OT Visit: 1 Visit OT Treatments $Self Care/Home Management : 8-22 mins $Therapeutic Exercise: 8-22 mins  Jackquline Denmark, MS, OTR/L , CBIS ascom [phone]  08/09/20, 1:53 PM

## 2020-08-09 NOTE — Progress Notes (Signed)
Mobility Specialist - Progress Note   08/09/20 1600  Mobility  Activity Ambulated in room  Level of Assistance Minimal assist, patient does 75% or more  Assistive Device Front wheel walker  Distance Ambulated (ft) 20 ft  Mobility Response Tolerated well  Mobility performed by Mobility specialist  $Mobility charge 1 Mobility    Pt ambulated in room with RW. Initially agreed to trial ambulation with SPC, which he uses at baseline, however pt declined right before OOB attempt d/t back pain. MinA during ambulation, cues to bring RW closer to body. No LOB. Therapeutic massage provided once returned to seated position to manage pain. No c/o SOB on RA.    Filiberto Pinks Mobility Specialist 08/09/20, 4:03 PM

## 2020-08-09 NOTE — Progress Notes (Signed)
Initial Nutrition Assessment  DOCUMENTATION CODES:  Not applicable  INTERVENTION:   Continue current diet as ordered  Add MVI with minerals daily for hx of EtOH abuse  Add Ensure Enlive po 1x/d, each supplement provides 350 kcal and 20 grams of protein  NUTRITION DIAGNOSIS:  Unintentional weight loss related to poor appetite as evidenced by per patient/family report.  GOAL:  Patient will meet greater than or equal to 90% of their needs  MONITOR:  PO intake,Supplement acceptance  REASON FOR ASSESSMENT:  Malnutrition Screening Tool    ASSESSMENT:  Pt presented to ED with RUQ abdominal pain, weakness, chest pain, and black tarry stools. PMH includes EtOH abuse (1-2 pints of liquor/d), tobacco abuse (1/2 PPD), anxiety, HTN, CKD. Recently admitted from 1/11-1/13 for GI bleed.  Unable to reach pt at this time, noted that pt received authorization to go to SNF for rehab prior to returning home. Told TOC he is unsure if he wants to go now. Medically stable per MD. Pt removing monitors and tele this afternoon telling nursing staff he is leaving. Reviewed intake this admission, good intake of meals. Vitamins in place for EtOH abuse. Will add daily MVI and supplements to encourage continued adequate intake.   4/13 - CT abdomen noted extensive hepatic steatosis 4/14 - EGD, findings: small hiatal hernia, non-bleeding cratered gastric ulcer in the gastric antrum. Moderate inflammation in the gastric antrum. Few non-bleeding cratered duodenal ulcers in the duodenal bulb. Inflammation in the duodenal bulb.  Average Meal Intake:  4/13-4/21: 87% average x 8 recorded meals  5% weight loss noted over the last 3 months, (not severe), but has gained over the last 6 months.  Relevant Scheduled Meds: . folic acid  1 mg Oral Daily  . pantoprazole  40 mg Oral Daily  . thiamine  100 mg Oral Daily   Relevant Continuous Infusions: . ampicillin-sulbactam (UNASYN) IV 3 g (08/09/20 1134)   Relevant   PRN Meds: docusate sodium, ondansetron, polyethylene glycol  Labs reviewed   NUTRITION - FOCUSED PHYSICAL EXAM: Defer to in-person assessment  Diet Order:   Diet Order            Diet regular Room service appropriate? Yes with Assist; Fluid consistency: Thin  Diet effective now                 EDUCATION NEEDS:  No education needs have been identified at this time  Skin:  Skin Assessment: Skin Integrity Issues: Skin Integrity Issues:: Stage II Stage II: right buttocks  Last BM:  4/21 per RN documentation, type 3  Height:  Ht Readings from Last 1 Encounters:  08/01/20 5\' 8"  (1.727 m)    Weight:  Wt Readings from Last 3 Encounters:  08/09/20 70 kg  05/01/20 73.9 kg  02/04/20 66.2 kg    Ideal Body Weight:  70 kg  BMI:  Body mass index is 23.48 kg/m.  Estimated Nutritional Needs:   Kcal:  1900-2100 kcal  Protein:  95-100g  Fluid:  >2 L/d   02/06/20, RD, LDN Clinical Dietitian Pager on Amion

## 2020-08-09 NOTE — Progress Notes (Signed)
Pt pulled out PIV and took himself off the heart monitor.  He states that he is going home and wants to get dressed.  MD notified.

## 2020-08-09 NOTE — TOC Transition Note (Addendum)
Transition of Care [facility]) - CM/SW Discharge Note   Patient Details  Name: Kadon Andrus MRN: [id number] Date of Birth: 1950/10/22  Transition of Care [facility]) CM/SW Contact:  Hetty Ely, RN Phone Number: 08/09/2020, 2:38 PM   Final next level of care: Skilled Nursing Facility Barriers to Discharge: Barriers Resolved  Discharge summary sent electronically.  Patient Goals and CMS Choice Patient states their goals for this hospitalization and ongoing recovery are:: To discharge to [email or web address].gov Compare Post Acute Care list provided to:: Patient Choice offered to / list presented to : Patient  Discharge Placement                 Patient to be transferred to facility by: [facility] Name of family member notified: NA    Discharge Plan and Services In-house Referral: Clinical Social Work   Post Acute Care Choice: Skilled Nursing Facility (cain , walker)          DME Arranged: N/A DME Agency: NA       HH Arranged: NA          Social Determinants of Health (SDOH) Interventions     Readmission Risk Interventions No flowsheet data found.

## 2020-08-09 NOTE — TOC Progression Note (Signed)
Transition of Care [facility]) - Progression Note    Patient Details  Name: Anfernee Peschke MRN: [id number] Date of Birth: 23-Dec-1950  [facility] CM/SW Contact  Hetty Ely, RN Phone Number: 08/09/2020, 1:12 PM  Clinical Narrative: Received a call from [facility] with Insurance Authorization, however patient says he need to think about it, not sure if he wants to go. Discussed options to accept bed offer or go home with [facility], patient states he does not want to go home. I told him that there were no other offers due to Insurance and lack of beds available. Attending and RN notified, Attending says she will discuss with patient tomorrow.      Expected Discharge Plan: Skilled Nursing Facility Barriers to Discharge: Continued Medical Work up  Expected Discharge Plan and Services Expected Discharge Plan: Skilled Nursing Facility In-house Referral: Clinical Social Work   Post Acute Care Choice: Skilled Nursing Facility (cain , walker) Living arrangements for the past 2 months: Apartment                                       Social Determinants of Health (SDOH) Interventions    Readmission Risk Interventions No flowsheet data found.

## 2020-08-09 NOTE — Discharge Summary (Addendum)
Physician Discharge Summary  Chad [name] FAO:[id number] DOB: 1951/02/12 DOA: 08/01/2020  Chad [name]: Chad [name], Chad A, MD  Admit date: 08/01/2020 Discharge date: 08/14/20  Admitted From: home  Disposition: home w/ home health (b/c pt refused SNF bed offers)  Recommendations for Outpatient Follow-up:  1. Follow up with Chad [name] in 1-2 weeks 2. F/u w/ GI, Chad [name]. Servando [name], in 2-3 weeks   Home Health: no  Equipment/Devices:   Discharge Condition: stable  CODE STATUS: full  Diet recommendation: Heart Healthy  Brief/Interim Summary:  HPI was taken from NP Blakeney: Synopsis of assessment and plan:  70 year old male with alcohol abuse who presented with right upper lobe quadrant abdominal pain, in the emergency department he has large amount of black tarry stool followed by he became hypotensive, tachycardic and pale  Received multiple IV fluid boluses and PRBCs   As per Chad [name]. Mila [name]: 69/M w history of alcoholism, chronic back pain, liver cirrhosis, Barrett's esophagus, peptic ulcer disease, pneumonia presented to the ED on 4/13 with chest pain, subsequently he was poorly responsive with melena, acute blood loss anemia, hypotension and tachycardia. -He was admitted to the ICU with hypotension, acute blood loss anemia.  -Stabilized with fluid resuscitation and blood transfusions -Underwent an endoscopy 4/14 which showed gastric and duodenal ulcers which were nonbleeding -[facility]-Hospital course further complicated by alcohol withdrawal and aspiration pneumonia   Hospital course from Chad [name]. Mayford [name] 4/20-4/21/22: Pt was found to have gastric & duodenal ulcers on EGD likely secondary to alcohol abuse. Pt did receive protonix BID while inpatient and was d/c with this medication as well. Pt did receive 2 units of pRBCs while inpatient. Pt's H&H has since stabilized. Pt received alcohol cessation counseling while inpatient. PT/OT evaluated pt and recommended SNF but pt refused  SNF bed offers and decided to go home w/ home health.  For more information, please see previous progress/consult notes.   Discharge Diagnoses:  Active Problems:   RUQ pain   Acute GI bleeding   Blood in stool   Duodenal ulcer without hemorrhage or perforation  Hemorrhagic shock: likely secondary to alcohol abuse & hx of gastric & duodenal ulcers. S/p 2 units of pRBCs transfused. Resolved  Gastric & duodenal ulcers: likely secondary to alcohol abuse. S/p EGD on 08/02/20 which showed nonbleeding gastric & duodenal ulcers. S/p 2 units pRBCs transfused.  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 Continue w/ supportive care   Alcohol withdrawal: drinks 1-2 pints of liquor daily. Continue on CIWA protocol. Alcohol cessation counseling. Continue on thiamine, folate   Hyponatremia: resolved  Hypomagnesemia: will monitor intermittently   Thrombocytopenia: resolved  Macrocytic anemia: secondary to alcohol abuse. H&H are stable   Discharge Instructions  Discharge Instructions    Diet - low sodium heart healthy   Complete by: As directed    Discharge instructions   Complete by: As directed    F/u w/ GI in 2-3 weeks. F/u w/ Chad [name] in 1-2 weeks   Discharge instructions   Complete by: As directed    F/u w/ Chad [name] in 1-2 weeks. F/u w/ GI, Chad [name]. Servando [name], in 2-4 weeks   Increase activity slowly   Complete by: As directed    Increase activity slowly   Complete by: As directed    No wound care   Complete by: As directed    No wound care   Complete by: As directed      Allergies as of  08/14/2020      Reactions   Morphine And Related Other (See Comments)   Causes "bad disposition"   Ibuprofen Other (See Comments)   Gi upset      Medication List    TAKE these medications   acetaminophen 325 MG tablet Commonly known as: TYLENOL Take 2 tablets (650 mg total) by mouth every 6 (six) hours as needed for mild pain  (pain score 1-3 or temp > 100.5).   escitalopram 10 MG tablet Commonly known as: LEXAPRO Take 1 tablet by mouth daily.   feeding supplement Liqd Take 237 mLs by mouth 2 (two) times daily between meals.   folic acid 1 MG tablet Commonly known as: FOLVITE Take 1 tablet (1 mg total) by mouth daily.   hydrOXYzine 25 MG capsule Commonly known as: Vistaril Take 1 capsule (25 mg total) by mouth 3 (three) times daily as needed for anxiety or itching.   nicotine 21 mg/24hr patch Commonly known as: NICODERM CQ - dosed in mg/24 hours One 21mg  patch chest wall daily (okay to substitute generic)   pantoprazole 40 MG tablet Commonly known as: Protonix Take 1 tablet (40 mg total) by mouth 2 (two) times daily.   sertraline 50 MG tablet Commonly known as: ZOLOFT Take 3 tablets (150 mg total) by mouth daily.   thiamine 100 MG tablet Take 1 tablet (100 mg total) by mouth daily.   traZODone 100 MG tablet Commonly known as: DESYREL Take 1 tablet (100 mg total) by mouth at bedtime.       Follow-up Information    [facility] Follow [facility].   Specialty: Home Health Services Why: Previously called [facility]. They will follow up with you for your home health needs. Contact information: [address] [name] [place] [phone]              Allergies  Allergen Reactions  . Morphine And Related Other (See Comments)    Causes "bad disposition"  . Ibuprofen Other (See Comments)    Gi upset    Consultations:  GI, Chad [name], [phone]  ICU    Procedures/Studies: DG Thoracic Spine 2 View  Result Date: 08/09/2020 CLINICAL DATA:  Low back pain after fall EXAM: THORACIC SPINE 2 VIEWS COMPARISON:  CT 02/04/2020 FINDINGS: Mild thoracic kyphosis. Minimal chronic wedging of mid to lower thoracic vertebra. Degenerative osteophytes. No acute osseous abnormality IMPRESSION: No acute osseous abnormality Electronically Signed   By: 02/06/2020 M.D.   On: 08/09/2020 19:25   DG  Lumbar Spine 2-3 Views  Result Date: 08/09/2020 CLINICAL DATA:  Pain after fall EXAM: LUMBAR SPINE - 2-3 VIEW COMPARISON:  05/27/2016 FINDINGS: Post fusion changes at L4-L5. Lumbar alignment within normal limits. Vertebral body heights are maintained. Mild to moderate disc space narrowing and degenerative change at L1-L2 and L2-L3. Mild disc space narrowing at L5-S1. Aortic atherosclerosis IMPRESSION: No acute osseous abnormality. Electronically Signed   By: 07/25/2016 M.D.   On: 08/09/2020 19:26   CT HEAD WO CONTRAST  Result Date: 08/12/2020 CLINICAL DATA:  New facial droop. Hemorrhagic shock. Rule out stroke. EXAM: CT HEAD WITHOUT CONTRAST TECHNIQUE: Contiguous axial images were obtained from the base of the skull through the vertex without intravenous contrast. COMPARISON:  08/09/2020 FINDINGS: Brain: There is no evidence of an acute infarct, intracranial hemorrhage, mass, midline shift, or extra-axial fluid collection. There is mild-to-moderate cerebral atrophy. Hypodensities in the cerebral white matter bilaterally are unchanged and nonspecific but compatible with mild chronic small  vessel ischemic disease. An expanded partially empty sella is noted. Vascular: No hyperdense vessel. Skull: No fracture or suspicious osseous lesion. Sinuses/Orbits: Minimal left maxillary sinus mucosal thickening. Clear mastoid air cells. Unremarkable orbits. Other: None. IMPRESSION: 1. No evidence of acute intracranial abnormality. 2. Mild chronic small vessel ischemic disease and cerebral atrophy. Electronically Signed   By: Sebastian Ache M.D.   On: 08/12/2020 15:21   CT HEAD WO CONTRAST  Result Date: 08/09/2020 CLINICAL DATA:  70 year old male with head trauma. EXAM: CT HEAD WITHOUT CONTRAST TECHNIQUE: Contiguous axial images were obtained from the base of the skull through the vertex without intravenous contrast. COMPARISON:  Head CT dated 08/01/2020. FINDINGS: Brain: The ventricles and sulci are appropriate size  for patient's age. Mild periventricular and deep white matter chronic microvascular ischemic changes noted. There is no acute intracranial hemorrhage. No mass effect or midline shift. No extra-axial fluid collection. Vascular: No hyperdense vessel or unexpected calcification. Skull: Normal. Negative for fracture or focal lesion. Sinuses/Orbits: No acute finding. Other: None IMPRESSION: 1. No acute intracranial pathology. 2. Mild chronic microvascular ischemic changes. Electronically Signed   By: Elgie Collard M.D.   On: 08/09/2020 19:11   CT HEAD WO CONTRAST  Result Date: 08/01/2020 CLINICAL DATA:  Mental status changes, frequent falls. EXAM: CT HEAD WITHOUT CONTRAST TECHNIQUE: Contiguous axial images were obtained from the base of the skull through the vertex without intravenous contrast. COMPARISON:  CT head 01/14/2020 FINDINGS: Brain: The brainstem, cerebellum, cerebral peduncles, thalami, basal ganglia, basilar cisterns, and ventricular system appear within normal limits. Periventricular white matter and corona radiata hypodensities favor chronic ischemic microvascular white matter disease. No intracranial hemorrhage, mass lesion, or acute CVA. Vascular: Unremarkable Skull: Unremarkable Sinuses/Orbits: Chronic bilateral maxillary sinusitis. Other: No supplemental non-categorized findings. IMPRESSION: 1. No acute intracranial findings. 2. Periventricular white matter and corona radiata hypodensities favor chronic ischemic microvascular white matter disease. 3. Mild chronic bilateral maxillary sinusitis. Electronically Signed   By: Gaylyn Rong M.D.   On: 08/01/2020 18:40   CT ABDOMEN PELVIS W CONTRAST  Result Date: 08/01/2020 CLINICAL DATA:  Abdominal distension and epigastric pain. Chest pain for 6-8 weeks. Reported alcohol use with multiple drinks daily. EXAM: CT ABDOMEN AND PELVIS WITH CONTRAST TECHNIQUE: Multidetector CT imaging of the abdomen and pelvis was performed using the standard  protocol following bolus administration of intravenous contrast. CONTRAST:  OMNIPAQUE IOHEXOL 300 MG/ML  SOLN COMPARISON:  05/01/2020 FINDINGS: Lower chest: No acute findings. Hepatobiliary: Liver demonstrates diffusely decreased attenuation consistent with fatty infiltration. No mass or focal lesion. Status post cholecystectomy. No bile duct dilation. Pancreas: Unremarkable. No pancreatic ductal dilatation or surrounding inflammatory changes. Spleen: Normal in size without focal abnormality. Adrenals/Urinary Tract: Adrenal glands are unremarkable. Kidneys are normal, without renal calculi, focal lesion, or hydronephrosis. Bladder is unremarkable. Stomach/Bowel: Normal stomach. Small bowel and colon are normal in caliber. No wall thickening. No inflammation. Numerous left colon diverticula mostly along the sigmoid. No diverticulitis. No evidence of appendicitis. Vascular/Lymphatic: Several prominent gastrohepatic ligament lymph nodes, largest 1.1 cm in short axis. No other lymphadenopathy. Aortic atherosclerosis. No aneurysm. Reproductive: Unremarkable. Other: No abdominal wall hernia or abnormality. No abdominopelvic ascites. Musculoskeletal: ORIF of [name] previous left proximal femur fracture. Previous L4-L5 posterior lumbar spine fusion. No fracture or acute finding. No bone lesion. IMPRESSION: 1. No acute findings. 2. Extensive hepatic steatosis.  No liver masses. 3. Left colon diverticulosis.  No evidence of diverticulitis. 4. Aortic atherosclerosis. Electronically Signed   By: Renard Hamper.D.  On: 08/01/2020 14:30   DG Chest Port 1 View  Result Date: 08/06/2020 CLINICAL DATA:  Productive cough and chest pain EXAM: PORTABLE CHEST 1 VIEW COMPARISON:  08/01/2020 FINDINGS: Patient has developed bilateral lower lobe bronchopneumonia, more extensive on the left than the right. Left lower lobe volume loss and left effusion also present. No significant bone finding. Mild aortic atherosclerotic calcification  is noted. IMPRESSION: Development of bilateral lower lobe bronchopneumonia, left worse than right. Left lower lobe volume loss and left effusion. Electronically Signed   By: Paulina Fusi M.D.   On: 08/06/2020 10:30   DG Chest Portable 1 View  Result Date: 08/01/2020 CLINICAL DATA:  70 year old male with chest pain.  Weakness. EXAM: PORTABLE CHEST 1 VIEW COMPARISON:  Chest CTA 02/04/2020 and earlier. FINDINGS: Portable AP semi upright view at 1121 hours. Mild lordotic positioning. Lung volumes and mediastinal contours are stable and within normal limits. Allowing for portable technique the lungs are clear. No pneumothorax or pleural effusion. No acute osseous abnormality identified. Negative visible bowel gas pattern. IMPRESSION: Negative portable chest. Electronically Signed   By: Odessa Fleming M.D.   On: 08/01/2020 11:59   CT Angio Abd/Pel w/ and/or w/o  Result Date: 08/01/2020 CLINICAL DATA:  Gastrointestinal bleeding. EXAM: CTA ABDOMEN AND PELVIS WITHOUT AND WITH CONTRAST TECHNIQUE: Multidetector CT imaging of the abdomen and pelvis was performed using the standard protocol during bolus administration of intravenous contrast. Multiplanar reconstructed images and MIPs were obtained and reviewed to evaluate the vascular anatomy. CONTRAST:  OMNIPAQUE IOHEXOL 350 MG/ML SOLN COMPARISON:  September 29, 2020. FINDINGS: VASCULAR Aorta: Atherosclerosis of abdominal aorta is noted without aneurysm or dissection. Celiac: Patent without evidence of aneurysm, dissection, vasculitis or significant stenosis. SMA: Patent without evidence of aneurysm, dissection, vasculitis or significant stenosis. Renals: Both renal arteries are patent without evidence of aneurysm, dissection, vasculitis, fibromuscular dysplasia or significant stenosis. IMA: Patent without evidence of aneurysm, dissection, vasculitis or significant stenosis. Inflow: Patent without evidence of aneurysm, dissection, vasculitis or significant stenosis. Proximal  Outflow: Bilateral common femoral and visualized portions of the superficial and profunda femoral arteries are patent without evidence of aneurysm, dissection, vasculitis or significant stenosis. Veins: No obvious venous abnormality within the limitations of this arterial phase study. Review of the MIP images confirms the above findings. NON-VASCULAR Lower chest: No acute abnormality. Hepatobiliary: Hepatic steatosis. Status post cholecystectomy. No biliary dilatation is noted. Pancreas: Unremarkable. No pancreatic ductal dilatation or surrounding inflammatory changes. Spleen: Normal in size without focal abnormality. Adrenals/Urinary Tract: Adrenal glands are unremarkable. Kidneys are normal, without renal calculi, focal lesion, or hydronephrosis. Bladder is unremarkable. Stomach/Bowel: The stomach appears normal. There is no evidence of bowel obstruction or inflammation. The appendix is not visualized. Sigmoid diverticulosis is noted without acute inflammation. There is no definite evidence of contrast extravasation involving the bowel. Lymphatic: No adenopathy is noted. Reproductive: Prostate is unremarkable. Other: No abdominal wall hernia or abnormality. No abdominopelvic ascites. Musculoskeletal: No fracture is seen. IMPRESSION: VASCULAR Atherosclerosis of abdominal aorta without aneurysm or dissection. No evidence of significant mesenteric or renal artery stenosis. No definite evidence of contrast extravasation to suggest active gastrointestinal hemorrhage. NON-VASCULAR Sigmoid diverticulosis without inflammation. Hepatic steatosis. Electronically Signed   By: Lupita Raider M.D.   On: 08/01/2020 19:01   US Abdomen Limited RUQ (LIVER/GB)  Result Date: 08/01/2020 CLINICAL DATA:  Right upper quadrant pain for 6-8 weeks, previous cholecystectomy EXAM: ULTRASOUND ABDOMEN LIMITED RIGHT UPPER QUADRANT COMPARISON:  08/01/2020 FINDINGS: Gallbladder: Surgically absent Common bile duct:  Diameter: 3 mm Liver:  Diffuse increased liver echotexture consistent with hepatic steatosis. No focal abnormality or intrahepatic duct dilation. Portal vein is patent on color Doppler imaging with normal direction of blood flow towards the liver. Other: None. IMPRESSION: 1. Hepatic steatosis. 2. Otherwise unremarkable exam in [name] patient status post cholecystectomy. Electronically Signed   By: Sharlet Salina M.D.   On: 08/01/2020 17:27     Subjective: Pt c/o dry eyes   Discharge Exam: Vitals:   08/14/20 0733 08/14/20 1043  BP: 125/81 128/88  Pulse: 73 83  Resp: 16 18  Temp: 97.8 F (36.6 C) 97.6 F (36.4 C)  SpO2: 94% 100%   Vitals:   08/14/20 0501 08/14/20 0510 08/14/20 0733 08/14/20 1043  BP: 129/83  125/81 128/88  Pulse: (!) 118 74 73 83  Resp: Temp: 98 F (36.7 C)  97.8 F (36.6 C) 97.6 F (36.4 C)  TempSrc:   Oral Oral  SpO2: 95%  94% 100%  Weight:      Height:        General: Pt is alert, awake, not in acute distress Cardiovascular:  S1/S2 +, no rubs, no gallops Respiratory: CTA bilaterally, no wheezing, no rhonchi Abdominal: Soft, NT, ND, bowel sounds + Extremities: no Chad [name], no cyanosis    The results of significant diagnostics from this hospitalization (including imaging, microbiology, ancillary and laboratory) are listed below for reference.     Microbiology: Recent Results (from the past 240 hour(s))  Resp Panel by RT-PCR (Flu [name]&B, Covid) Nasopharyngeal Swab     Status: None   Collection Time: 08/09/20  2:40 PM   Specimen: Nasopharyngeal Swab; Nasopharyngeal(NP) swabs in vial transport medium  Result Value Ref Range Status   SARS Coronavirus 2 by RT PCR NEGATIVE NEGATIVE Final    Comment: (NOTE) SARS-CoV-2 target nucleic acids are NOT DETECTED.  The SARS-CoV-2 RNA is generally detectable in upper respiratory specimens during the acute phase of infection. The lowest concentration of SARS-CoV-2 viral copies this assay can detect is 138 copies/mL. [name] negative result  does not preclude SARS-Cov-2 infection and should not be used as the sole basis for treatment or other patient management decisions. [name] negative result may occur with  improper specimen collection/handling, submission of specimen other than nasopharyngeal swab, presence of viral mutation(s) within the areas targeted by this assay, and inadequate number of viral copies(<138 copies/mL). [name] negative result must be combined with clinical observations, patient history, and epidemiological information. The expected result is Negative.  Fact Sheet for Patients:  BloggerCourse.com  Fact Sheet for Healthcare Providers:  SeriousBroker.it  This test is no t yet approved or cleared by the [place] FDA and  has been authorized for detection and/or diagnosis of SARS-CoV-2 by FDA under an Emergency Use Authorization (EUA). This EUA will remain  in effect (meaning this test can be used) for the duration of the COVID-19 declaration under Section 564(b)(1) of the Act, 21 U.S.C.section 360bbb-3(b)(1), unless the authorization is terminated  or revoked sooner.       Influenza [name] by PCR NEGATIVE NEGATIVE Final   Influenza B by PCR NEGATIVE NEGATIVE Final    Comment: (NOTE) The Xpert Xpress SARS-CoV-2/FLU/RSV plus assay is intended as an aid in the diagnosis of influenza from Nasopharyngeal swab specimens and should not be used as [name] sole basis for treatment. Nasal washings and aspirates are unacceptable for Xpert Xpress SARS-CoV-2/FLU/RSV testing.  Fact Sheet for Patients: BloggerCourse.com  Fact Sheet for Healthcare Providers: SeriousBroker.it  This test is not yet approved or cleared by the [place] and has been authorized for detection and/or diagnosis of SARS-CoV-2 by FDA under an Emergency Use Authorization (EUA). This EUA will remain in effect (meaning this test can be used) for  the duration of the COVID-19 declaration under Section 564(b)(1) of the Act, 21 U.S.C. section 360bbb-3(b)(1), unless the authorization is terminated or revoked.  Performed at [facility], [address] [place], [place] [address]      Labs: BNP (last 3 results) Recent Labs    08/01/20 1127  BNP 184.4*   Basic Metabolic Panel: Recent Labs  Lab 08/10/20 0452 08/11/20 0537 08/12/20 0407 08/13/20 0341 08/14/20 0521  NA 137 137 137 137 137  K 4.0 4.1 4.1 3.9 4.1  CL 105 103 105 104 103  CO2 24 24 24 25 25   GLUCOSE 90 100* 101* 106* 116*  BUN 10 9 11 12 13   CREATININE 0.76 0.73 0.81 0.79 0.71  CALCIUM 8.3* 8.8* 8.6* 8.6* 8.9  MG  --   --  2.0 1.8 1.8   Liver Function Tests: Recent Labs  Lab 08/09/20 0524 08/10/20 0452 08/11/20 0537 08/12/20 0407 08/13/20 0341  AST 41 33 30 28 27   ALT 26 21 20 18 19   ALKPHOS 139* 123 125 116 111  BILITOT 0.7 0.7 0.6 0.4 0.5  PROT 5.3* 5.0* 5.6* 5.3* 5.8*  ALBUMIN 2.4* 2.3* 2.4* 2.3* 2.5*   No results for input(s): LIPASE, AMYLASE in the last 168 hours. No results for input(s): AMMONIA in the last 168 hours. CBC: Recent Labs  Lab 08/10/20 0452 08/11/20 0537 08/12/20 0407 08/13/20 0341 08/14/20 0521  WBC 4.7 4.8 5.4 5.3 6.3  HGB 10.6* 11.5* 10.4* 10.8* 12.0*  HCT 32.0* 36.4* 32.5* 33.5* 37.1*  MCV 97.9 100.0 99.1 98.2 98.1  PLT 201 207 237 255 289   Cardiac Enzymes: No results for input(s): CKTOTAL, CKMB, CKMBINDEX, TROPONINI in the last 168 hours. BNP: Invalid input(s): POCBNP CBG: Recent Labs  Lab 08/12/20 1421  GLUCAP 99   D-Dimer No results for input(s): DDIMER in the last 72 hours. Hgb A1c No results for input(s): HGBA1C in the last 72 hours. Lipid Profile No results for input(s): CHOL, HDL, LDLCALC, TRIG, CHOLHDL, LDLDIRECT in the last 72 hours. Thyroid function studies No results for input(s): TSH, T4TOTAL, T3FREE, THYROIDAB in the last 72 hours.  Invalid input(s): FREET3 Anemia work  up No results for input(s): VITAMINB12, FOLATE, FERRITIN, TIBC, IRON, RETICCTPCT in the last 72 hours. Urinalysis    Component Value Date/Time   COLORURINE YELLOW ([name]) 08/01/2020 1343   APPEARANCEUR CLEAR ([name]) 08/01/2020 1343   LABSPEC 1.003 (L) 08/01/2020 1343   PHURINE 8.0 08/01/2020 1343   GLUCOSEU NEGATIVE 08/01/2020 1343   HGBUR NEGATIVE 08/01/2020 1343   BILIRUBINUR NEGATIVE 08/01/2020 1343   KETONESUR NEGATIVE 08/01/2020 1343   PROTEINUR NEGATIVE 08/01/2020 1343   NITRITE NEGATIVE 08/01/2020 1343   LEUKOCYTESUR NEGATIVE 08/01/2020 1343   Sepsis Labs Invalid input(s): PROCALCITONIN,  WBC,  LACTICIDVEN Microbiology Recent Results (from the past 240 hour(s))  Resp Panel by RT-PCR (Flu [name]&B, Covid) Nasopharyngeal Swab     Status: None   Collection Time: 08/09/20  2:40 PM   Specimen: Nasopharyngeal Swab; Nasopharyngeal(NP) swabs in vial transport medium  Result Value Ref Range Status   SARS Coronavirus 2 by RT PCR NEGATIVE NEGATIVE Final    Comment: (NOTE) SARS-CoV-2 target nucleic acids are NOT DETECTED.  The SARS-CoV-2 RNA is generally detectable in  upper respiratory specimens during the acute phase of infection. The lowest concentration of SARS-CoV-2 viral copies this assay can detect is 138 copies/mL. [name] negative result does not preclude SARS-Cov-2 infection and should not be used as the sole basis for treatment or other patient management decisions. [name] negative result may occur with  improper specimen collection/handling, submission of specimen other than nasopharyngeal swab, presence of viral mutation(s) within the areas targeted by this assay, and inadequate number of viral copies(<138 copies/mL). [name] negative result must be combined with clinical observations, patient history, and epidemiological information. The expected result is Negative.  Fact Sheet for Patients:  BloggerCourse.com  Fact Sheet for Healthcare Providers:   SeriousBroker.it  This test is no t yet approved or cleared by the [place] FDA and  has been authorized for detection and/or diagnosis of SARS-CoV-2 by FDA under an Emergency Use Authorization (EUA). This EUA will remain  in effect (meaning this test can be used) for the duration of the COVID-19 declaration under Section 564(b)(1) of the Act, 21 U.S.C.section 360bbb-3(b)(1), unless the authorization is terminated  or revoked sooner.       Influenza [name] by PCR NEGATIVE NEGATIVE Final   Influenza B by PCR NEGATIVE NEGATIVE Final    Comment: (NOTE) The Xpert Xpress SARS-CoV-2/FLU/RSV plus assay is intended as an aid in the diagnosis of influenza from Nasopharyngeal swab specimens and should not be used as [name] sole basis for treatment. Nasal washings and aspirates are unacceptable for Xpert Xpress SARS-CoV-2/FLU/RSV testing.  Fact Sheet for Patients: [employer]  Fact Sheet for Healthcare Providers: SeriousBroker.it  This test is not yet approved or cleared by the [place] FDA and has been authorized for detection and/or diagnosis of SARS-CoV-2 by FDA under an Emergency Use Authorization (EUA). This EUA will remain in effect (meaning this test can be used) for the duration of the COVID-19 declaration under Section 564(b)(1) of the Act, 21 U.S.C. section 360bbb-3(b)(1), unless the authorization is terminated or revoked.  Performed at [facility], [address] [place], [place] [address]      Time coordinating discharge: Over 30 minutes  SIGNED:   Charise Killian, MD  [facility] 08/14/2020, 12:36 PM Pager

## 2020-08-09 NOTE — Care Management Important Message (Signed)
Important Message  Patient Details  Name: Chad [name] MRN: [id number] Date of Birth: 18-Oct-1950   Medicare Important Message Given:  Yes     [name] 08/09/2020, 1:12 PM

## 2020-08-09 NOTE — Progress Notes (Signed)
PROGRESS NOTE    Chad [name]  QQP:[id number] DOB: 1950-04-28 DOA: 08/01/2020 PCP: Rayetta Humphrey, MD   Assessment & Plan:   Active Problems:   RUQ pain   Acute GI bleeding   Blood in stool   Duodenal ulcer without hemorrhage or perforation  Hemorrhagic shock: likely secondary to alcohol abuse & hx of gastric & duodenal ulcers. S/p 2 units of pRBCs transfused. Resolved  Gastric & duodenal ulcers: likely secondary to alcohol abuse. S/p EGD on 08/02/20 which showed nonbleeding gastric & duodenal ulcers. S/p 2 units pRBCs transfused.  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 Continue w/ supportive care   Alcohol withdrawal: drinks 1-2 pints of liquor daily. Continue on CIWA protocol. Alcohol cessation counseling. Continue on thiamine, folate   Hyponatremia: resolved  Hypomagnesemia: will monitor intermittently   Thrombocytopenia: resolved  Macrocytic anemia: secondary to alcohol abuse. H&H are stable    DVT prophylaxis: SCDs Code Status: full  Family Communication:  Disposition Plan: likely d/c to SNF   Level of care: Progressive Cardiac   Status is: Inpatient  Remains inpatient appropriate because:Unsafe d/c plan, IV treatments appropriate due to intensity of illness or inability to take PO and Inpatient level of care appropriate due to severity of illness   Dispo: The patient is from: Home              Anticipated d/c is to: SNF              Patient currently is medically stable to d/c.   Difficult to place patient No        Consultants:   GI    Procedures: EGD   Antimicrobials: unasyn    Subjective: Pt c/o fatigue   Objective: Vitals:   08/08/20 2118 08/09/20 0414 08/09/20 0417 08/09/20 0453  BP: 109/64 111/66  111/70  Pulse: 66 62  61  Resp: 20 18  18   Temp: 98.4 F (36.9 C) 97.9 F (36.6 C)  97.9 F (36.6  C)  TempSrc: Oral Oral    SpO2: 100% 96%  96%  Weight:   70 kg 70 kg  Height:        Intake/Output Summary (Last 24 hours) at 08/09/2020 0752 Last data filed at 08/09/2020 0600 Gross per 24 hour  Intake 240 ml  Output 2700 ml  Net -2460 ml   Filed Weights   08/07/20 0400 08/09/20 0417 08/09/20 0453  Weight: 74.3 kg 70 kg 70 kg    Examination:  General exam: Appears comfortable.  Respiratory system: clear breath sounds b/l. No rubs or gallops  Cardiovascular system: S1/S2+. No rubs or clicks  Gastrointestinal system: Abd is soft, NT, ND & hypoactive bowel sounds  Central nervous system: Alert and oriented. Moves all 4 extremities  Psychiatry: Judgement and insight appear normal. Flat mood and affect     Data Reviewed: I have personally reviewed following labs and imaging studies  CBC: Recent Labs  Lab 08/05/20 0635 08/05/20 1113 08/07/20 0613 08/08/20 0501 08/09/20 0524  WBC 3.9* 4.2 4.1 5.2 4.4  HGB 10.6* 10.9* 11.6* 10.7* 11.3*  HCT 31.6* 32.2* 35.3* 33.4* 35.7*  MCV 96.9 95.8 97.5 97.9 100.3*  PLT 93* 100* 139* 164 180   Basic Metabolic Panel: Recent Labs  Lab 08/03/20 0253 08/04/20 0515 08/04/20 1028 08/05/20 0635 08/06/20 0527 08/07/20 0613 08/09/20 0524  NA 135 133* 134* 139 135 139 137  K 4.5 4.1 3.9 3.9 3.9 4.4 4.1  CL 106 102 103 105 103 106 104  CO2 23 24 23 26 25 27 24   GLUCOSE 119* 96 107* 88 103* 88 90  BUN 8 9 9  6* 6* 8 12  CREATININE 0.63 0.87 0.74 0.62 0.76 0.78 0.77  CALCIUM 7.5* 7.5* 7.6* 7.9* 8.1* 8.3* 8.5*  MG 2.1 1.9 1.7 2.0 2.0  --   --   PHOS 4.6 3.5 3.8  --  4.2  --   --    GFR: Estimated Creatinine Clearance: 86.7 mL/min (by C-G formula based on SCr of 0.77 mg/dL). Liver Function Tests: Recent Labs  Lab 08/03/20 0253 08/04/20 1028 08/05/20 0635 08/06/20 0527 08/07/20 0613 08/09/20 0524  AST 109* 88* 70*  --  48* 41  ALT 41 42 36  --  32 26  ALKPHOS 141* 133* 130*  --  145* 139*  BILITOT 1.2 0.9 1.1  --  0.7 0.7   PROT 4.2* 4.5* 4.5*  --  4.8* 5.3*  ALBUMIN 2.0* 2.1* 2.1* 2.3* 2.2* 2.4*   No results for input(s): LIPASE, AMYLASE in the last 168 hours. No results for input(s): AMMONIA in the last 168 hours. Coagulation Profile: No results for input(s): INR, PROTIME in the last 168 hours. Cardiac Enzymes: No results for input(s): CKTOTAL, CKMB, CKMBINDEX, TROPONINI in the last 168 hours. BNP (last 3 results) No results for input(s): PROBNP in the last 8760 hours. HbA1C: No results for input(s): HGBA1C in the last 72 hours. CBG: No results for input(s): GLUCAP in the last 168 hours. Lipid Profile: No results for input(s): CHOL, HDL, LDLCALC, TRIG, CHOLHDL, LDLDIRECT in the last 72 hours. Thyroid Function Tests: No results for input(s): TSH, T4TOTAL, FREET4, T3FREE, THYROIDAB in the last 72 hours. Anemia Panel: No results for input(s): VITAMINB12, FOLATE, FERRITIN, TIBC, IRON, RETICCTPCT in the last 72 hours. Sepsis Labs: Recent Labs  Lab 08/03/20 0908  LATICACIDVEN 1.4    Recent Results (from the past 240 hour(s))  Resp Panel by RT-PCR (Flu A&B, Covid) Nasopharyngeal Swab     Status: None   Collection Time: 08/01/20  1:43 PM   Specimen: Nasopharyngeal Swab; Nasopharyngeal(NP) swabs in vial transport medium  Result Value Ref Range Status   SARS Coronavirus 2 by RT PCR NEGATIVE NEGATIVE Final    Comment: (NOTE) SARS-CoV-2 target nucleic acids are NOT DETECTED.  The SARS-CoV-2 RNA is generally detectable in upper respiratory specimens during the acute phase of infection. The lowest concentration of SARS-CoV-2 viral copies this assay can detect is 138 copies/mL. A negative result does not preclude SARS-Cov-2 infection and should not be used as the sole basis for treatment or other patient management decisions. A negative result may occur with  improper specimen collection/handling, submission of specimen other than nasopharyngeal swab, presence of viral mutation(s) within the areas  targeted by this assay, and inadequate number of viral copies(<138 copies/mL). A negative result must be combined with clinical observations, patient history, and epidemiological information. The expected result is Negative.  Fact Sheet for Patients:  0909  Fact Sheet for Healthcare Providers:  08/03/20  This test is no t yet approved or cleared by the [employer] FDA and  has been authorized for detection and/or diagnosis of SARS-CoV-2 by FDA under an Emergency Use Authorization (EUA). This EUA will remain  in effect (meaning this test can be used) for the duration of the COVID-19 declaration under Section 564(b)(1) of the Act, 21 U.S.C.section 360bbb-3(b)(1), unless the authorization is  terminated  or revoked sooner.       Influenza A by PCR NEGATIVE NEGATIVE Final   Influenza B by PCR NEGATIVE NEGATIVE Final    Comment: (NOTE) The Xpert Xpress SARS-CoV-2/FLU/RSV plus assay is intended as an aid in the diagnosis of influenza from Nasopharyngeal swab specimens and should not be used as a sole basis for treatment. Nasal washings and aspirates are unacceptable for Xpert Xpress SARS-CoV-2/FLU/RSV testing.  Fact Sheet for Patients: BloggerCourse.com  Fact Sheet for Healthcare Providers: SeriousBroker.it  This test is not yet approved or cleared by the [place] FDA and has been authorized for detection and/or diagnosis of SARS-CoV-2 by FDA under an Emergency Use Authorization (EUA). This EUA will remain in effect (meaning this test can be used) for the duration of the COVID-19 declaration under Section 564(b)(1) of the Act, 21 U.S.C. section 360bbb-3(b)(1), unless the authorization is terminated or revoked.  Performed at [facility], [address] [place], [place] [address]          Radiology Studies: No results  found.      Scheduled Meds: . diazepam  2 mg Oral BID  . folic acid  1 mg Oral Daily  . nicotine  14 mg Transdermal Daily  . pantoprazole  40 mg Oral Daily  . thiamine  100 mg Oral Daily   Continuous Infusions: . sodium chloride    . ampicillin-sulbactam (UNASYN) IV 3 g (08/09/20 0415)     LOS: 8 days    Time spent: 30 mins     Charise Killian, MD [facility] Pager [phone]  If 7PM-7AM, please contact night-coverage 08/09/2020, 7:52 AM

## 2020-08-09 NOTE — Progress Notes (Addendum)
Pt found sitting on the floor in his room by Santina Evans, ST at [phone].  Pt states that he was "looking for his wheelchair".  He had been assisted up to the chair earlier by the mobility specialist and did 75% or more with minimal assist. Pt states that his low back and the back of his head hurt.  No visible injury noted.  Pt also states that his left wrist hurts because he braced himself with his left hand.  VSS. Dr Mayford Knife notified at [phone].  Head CT and back x rays ordered to r/o injury. Pt did not want his niece to be notified.  She is the only contact listed.

## 2020-08-10 DIAGNOSIS — K250 Acute gastric ulcer with hemorrhage: Secondary | ICD-10-CM

## 2020-08-10 LAB — COMPREHENSIVE METABOLIC PANEL
ALT: 21 U/L (ref 0–44)
AST: 33 U/L (ref 15–41)
Albumin: 2.3 g/dL — ABNORMAL LOW (ref 3.5–5.0)
Alkaline Phosphatase: 123 U/L (ref 38–126)
Anion gap: 8 (ref 5–15)
BUN: 10 mg/dL (ref 8–23)
CO2: 24 mmol/L (ref 22–32)
Calcium: 8.3 mg/dL — ABNORMAL LOW (ref 8.9–10.3)
Chloride: 105 mmol/L (ref 98–111)
Creatinine, Ser: 0.76 mg/dL (ref 0.61–1.24)
GFR, Estimated: >60 mL/min (ref >60)
Glucose, Bld: 90 mg/dL (ref 70–99)
Potassium: 4 mmol/L (ref 3.5–5.1)
Sodium: 137 mmol/L (ref 135–145)
Total Bilirubin: 0.7 mg/dL (ref 0.3–1.2)
Total Protein: 5 g/dL — ABNORMAL LOW (ref 6.5–8.1)

## 2020-08-10 LAB — CBC
HCT: 32 % — ABNORMAL LOW (ref 39.0–52.0)
Hemoglobin: 10.6 g/dL — ABNORMAL LOW (ref 13.0–17.0)
MCH: 32.4 pg (ref 26.0–34.0)
MCHC: 33.1 g/dL (ref 30.0–36.0)
MCV: 97.9 fL (ref 80.0–100.0)
Platelets: 201 10*3/uL (ref 150–400)
RBC: 3.27 MIL/uL — ABNORMAL LOW (ref 4.22–5.81)
RDW: 16 % — ABNORMAL HIGH (ref 11.5–15.5)
WBC: 4.7 10*3/uL (ref 4.0–10.5)
nRBC: 0 % (ref 0.0–0.2)

## 2020-08-10 MED ORDER — AMOXICILLIN-POT CLAVULANATE 875-125 MG PO TABS: 1.0000 | ORAL_TABLET | Freq: Two times a day (BID) | ORAL | 0 refills | Status: DC

## 2020-08-10 NOTE — Progress Notes (Addendum)
PROGRESS NOTE    Chad [name]  JFH:[id number] DOB: 12-13-1950 DOA: 08/01/2020 PCP: Rayetta Humphrey, MD   Assessment & Plan:   Active Problems:   RUQ pain   Acute GI bleeding   Blood in stool   Duodenal ulcer without hemorrhage or perforation  Hemorrhagic shock: likely secondary to alcohol abuse & hx of gastric & duodenal ulcers. S/p 2 units of pRBCs transfused. Resolved  Gastric & duodenal ulcers: likely secondary to alcohol abuse. S/p EGD on 08/02/20 which showed nonbleeding gastric & duodenal ulcers. S/p 2 units pRBCs transfused.  Continue on PPI  Aspiration pneumonia: will complete IV abx course today. 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 Continue w/ supportive care   Alcohol withdrawal: drinks 1-2 pints of liquor daily. Continue on thiamine, folate. Alcohol cessation counseling. Resolved  Hyponatremia: resolved  Hypomagnesemia: will monitor intermittently   Thrombocytopenia: resolved  Macrocytic anemia: secondary to alcohol abuse. H&H are stable     DVT prophylaxis: SCDs Code Status: full  Family Communication:  Disposition Plan: likely d/c to SNF   Level of care: Progressive Cardiac   Status is: Inpatient  Remains inpatient appropriate because:Unsafe d/c plan, IV treatments appropriate due to intensity of illness or inability to take PO and Inpatient level of care appropriate due to severity of illness, pt's only SNF bed rescinded their bed offer today so CM working on other options   Dispo: The patient is from: Home              Anticipated d/c is to: SNF              Patient currently is medically stable to d/c.   Difficult to place patient No        Consultants:   GI    Procedures: EGD   Antimicrobials: unasyn    Subjective: Pt c/o weakness   Objective: Vitals:   08/10/20 0500 08/10/20 0551 08/10/20 0822 08/10/20 1159  BP:  (!) 103/57 (!) 111/54  115/68  Pulse:  64 65 64  Resp:  18  14  Temp:  98.2 F (36.8 C) 98.4 F (36.9 C) 98 F (36.7 C)  TempSrc:  Oral Oral Oral  SpO2:  96% 95% 97%  Weight: 70.2 kg     Height:        Intake/Output Summary (Last 24 hours) at 08/10/2020 1357 Last data filed at 08/10/2020 0824 Gross per 24 hour  Intake 597 ml  Output 1050 ml  Net -453 ml   Filed Weights   08/09/20 0417 08/09/20 0453 08/10/20 0500  Weight: 70 kg 70 kg 70.2 kg    Examination:  General exam: Appears comfortable .  Respiratory system: clear breath sounds b/l  Cardiovascular system: S1 & S2+. No rubs or clicks  Gastrointestinal system: Abd is soft, NT,ND & normal bowel sounds   Central nervous system: Alert and oriented. Moves all extremities  Psychiatry: Judgement and insight appear normal. Flat mood and affect     Data Reviewed: I have personally reviewed following labs and imaging studies  CBC: Recent Labs  Lab 08/05/20 1113 08/07/20 0613 08/08/20 0501 08/09/20 0524 08/10/20 0452  WBC 4.2 4.1 5.2 4.4 4.7  HGB 10.9* 11.6* 10.7* 11.3* 10.6*  HCT 32.2* 35.3* 33.4* 35.7* 32.0*  MCV 95.8 97.5 97.9 100.3* 97.9  PLT 100* 139* 164 180 201   Basic Metabolic Panel: Recent Labs  Lab 08/04/20 0515 08/04/20 1028 08/05/20 0635 08/06/20 0527 08/07/20  7026 08/09/20 0524 08/10/20 0452  NA 133* 134* 139 135 139 137 137  K 4.1 3.9 3.9 3.9 4.4 4.1 4.0  CL 102 103 105 103 106 104 105  CO2 24 23 26 25 27 24 24   GLUCOSE 96 107* 88 103* 88 90 90  BUN 9 9 6* 6* 8 12 10   CREATININE 0.87 0.74 0.62 0.76 0.78 0.77 0.76  CALCIUM 7.5* 7.6* 7.9* 8.1* 8.3* 8.5* 8.3*  MG 1.9 1.7 2.0 2.0  --   --   --   PHOS 3.5 3.8  --  4.2  --   --   --    GFR: Estimated Creatinine Clearance: 86.7 mL/min (by C-G formula based on SCr of 0.76 mg/dL). Liver Function Tests: Recent Labs  Lab 08/04/20 1028 08/05/20 0635 08/06/20 0527 08/07/20 0613 08/09/20 0524 08/10/20 0452  AST 88* 70*  --  48* 41 33  ALT 42 36  --  32 26 21   ALKPHOS 133* 130*  --  145* 139* 123  BILITOT 0.9 1.1  --  0.7 0.7 0.7  PROT 4.5* 4.5*  --  4.8* 5.3* 5.0*  ALBUMIN 2.1* 2.1* 2.3* 2.2* 2.4* 2.3*   No results for input(s): LIPASE, AMYLASE in the last 168 hours. No results for input(s): AMMONIA in the last 168 hours. Coagulation Profile: No results for input(s): INR, PROTIME in the last 168 hours. Cardiac Enzymes: No results for input(s): CKTOTAL, CKMB, CKMBINDEX, TROPONINI in the last 168 hours. BNP (last 3 results) No results for input(s): PROBNP in the last 8760 hours. HbA1C: No results for input(s): HGBA1C in the last 72 hours. CBG: No results for input(s): GLUCAP in the last 168 hours. Lipid Profile: No results for input(s): CHOL, HDL, LDLCALC, TRIG, CHOLHDL, LDLDIRECT in the last 72 hours. Thyroid Function Tests: No results for input(s): TSH, T4TOTAL, FREET4, T3FREE, THYROIDAB in the last 72 hours. Anemia Panel: No results for input(s): VITAMINB12, FOLATE, FERRITIN, TIBC, IRON, RETICCTPCT in the last 72 hours. Sepsis Labs: No results for input(s): PROCALCITON, LATICACIDVEN in the last 168 hours.  Recent Results (from the past 240 hour(s))  Resp Panel by RT-PCR (Flu A&B, Covid) Nasopharyngeal Swab     Status: None   Collection Time: 08/01/20  1:43 PM   Specimen: Nasopharyngeal Swab; Nasopharyngeal(NP) swabs in vial transport medium  Result Value Ref Range Status   SARS Coronavirus 2 by RT PCR NEGATIVE NEGATIVE Final    Comment: (NOTE) SARS-CoV-2 target nucleic acids are NOT DETECTED.  The SARS-CoV-2 RNA is generally detectable in upper respiratory specimens during the acute phase of infection. The lowest concentration of SARS-CoV-2 viral copies this assay can detect is 138 copies/mL. A negative result does not preclude SARS-Cov-2 infection and should not be used as the sole basis for treatment or other patient management decisions. A negative result may occur with  improper specimen collection/handling, submission of  specimen other than nasopharyngeal swab, presence of viral mutation(s) within the areas targeted by this assay, and inadequate number of viral copies(<138 copies/mL). A negative result must be combined with clinical observations, patient history, and epidemiological information. The expected result is Negative.  Fact Sheet for Patients:  08/12/20  Fact Sheet for Healthcare Providers:  08/03/20  This test is no t yet approved or cleared by the [employer] FDA and  has been authorized for detection and/or diagnosis of SARS-CoV-2 by FDA under an Emergency Use Authorization (EUA). This EUA will remain  in effect (meaning this test can be used) for  the duration of the COVID-19 declaration under Section 564(b)(1) of the Act, 21 U.S.C.section 360bbb-3(b)(1), unless the authorization is terminated  or revoked sooner.       Influenza A by PCR NEGATIVE NEGATIVE Final   Influenza B by PCR NEGATIVE NEGATIVE Final    Comment: (NOTE) The Xpert Xpress SARS-CoV-2/FLU/RSV plus assay is intended as an aid in the diagnosis of influenza from Nasopharyngeal swab specimens and should not be used as a sole basis for treatment. Nasal washings and aspirates are unacceptable for Xpert Xpress SARS-CoV-2/FLU/RSV testing.  Fact Sheet for Patients: BloggerCourse.com  Fact Sheet for Healthcare Providers: SeriousBroker.it  This test is not yet approved or cleared by the [place] FDA and has been authorized for detection and/or diagnosis of SARS-CoV-2 by FDA under an Emergency Use Authorization (EUA). This EUA will remain in effect (meaning this test can be used) for the duration of the COVID-19 declaration under Section 564(b)(1) of the Act, 21 U.S.C. section 360bbb-3(b)(1), unless the authorization is terminated or revoked.  Performed at [facility], [address] [place], [place] [address]   Resp Panel by RT-PCR (Flu A&B, Covid) Nasopharyngeal Swab     Status: None   Collection Time: 08/09/20  2:40 PM   Specimen: Nasopharyngeal Swab; Nasopharyngeal(NP) swabs in vial transport medium  Result Value Ref Range Status   SARS Coronavirus 2 by RT PCR NEGATIVE NEGATIVE Final    Comment: (NOTE) SARS-CoV-2 target nucleic acids are NOT DETECTED.  The SARS-CoV-2 RNA is generally detectable in upper respiratory specimens during the acute phase of infection. The lowest concentration of SARS-CoV-2 viral copies this assay can detect is 138 copies/mL. A negative result does not preclude SARS-Cov-2 infection and should not be used as the sole basis for treatment or other patient management decisions. A negative result may occur with  improper specimen collection/handling, submission of specimen other than nasopharyngeal swab, presence of viral mutation(s) within the areas targeted by this assay, and inadequate number of viral copies(<138 copies/mL). A negative result must be combined with clinical observations, patient history, and epidemiological information. The expected result is Negative.  Fact Sheet for Patients:  BloggerCourse.com  Fact Sheet for Healthcare Providers:  SeriousBroker.it  This test is no t yet approved or cleared by the [place] FDA and  has been authorized for detection and/or diagnosis of SARS-CoV-2 by FDA under an Emergency Use Authorization (EUA). This EUA will remain  in effect (meaning this test can be used) for the duration of the COVID-19 declaration under Section 564(b)(1) of the Act, 21 U.S.C.section 360bbb-3(b)(1), unless the authorization is terminated  or revoked sooner.       Influenza A by PCR NEGATIVE NEGATIVE Final   Influenza B by PCR NEGATIVE NEGATIVE Final    Comment: (NOTE) The Xpert Xpress SARS-CoV-2/FLU/RSV plus assay is intended as an aid in the  diagnosis of influenza from Nasopharyngeal swab specimens and should not be used as a sole basis for treatment. Nasal washings and aspirates are unacceptable for Xpert Xpress SARS-CoV-2/FLU/RSV testing.  Fact Sheet for Patients: BloggerCourse.com  Fact Sheet for Healthcare Providers: SeriousBroker.it  This test is not yet approved or cleared by the [place] FDA and has been authorized for detection and/or diagnosis of SARS-CoV-2 by FDA under an Emergency Use Authorization (EUA). This EUA will remain in effect (meaning this test can be used) for the duration of the COVID-19 declaration under Section 564(b)(1) of the Act, 21 U.S.C. section 360bbb-3(b)(1), unless the authorization is terminated or revoked.  Performed at [facility], [address] [place], [place] [address]          Radiology Studies: DG Thoracic Spine 2 View  Result Date: 08/09/2020 CLINICAL DATA:  Low back pain after fall EXAM: THORACIC SPINE 2 VIEWS COMPARISON:  CT 02/04/2020 FINDINGS: Mild thoracic kyphosis. Minimal chronic wedging of mid to lower thoracic vertebra. Degenerative osteophytes. No acute osseous abnormality IMPRESSION: No acute osseous abnormality Electronically Signed   By: Jasmine PangKim  Fujinaga M.D.   On: 08/09/2020 19:25   DG Lumbar Spine 2-3 Views  Result Date: 08/09/2020 CLINICAL DATA:  Pain after fall EXAM: LUMBAR SPINE - 2-3 VIEW COMPARISON:  05/27/2016 FINDINGS: Post fusion changes at L4-L5. Lumbar alignment within normal limits. Vertebral body heights are maintained. Mild to moderate disc space narrowing and degenerative change at L1-L2 and L2-L3. Mild disc space narrowing at L5-S1. Aortic atherosclerosis IMPRESSION: No acute osseous abnormality. Electronically Signed   By: Jasmine PangKim  Fujinaga M.D.   On: 08/09/2020 19:26   CT HEAD WO CONTRAST  Result Date: 08/09/2020 CLINICAL DATA:  70 year old male with head trauma. EXAM: CT HEAD  WITHOUT CONTRAST TECHNIQUE: Contiguous axial images were obtained from the base of the skull through the vertex without intravenous contrast. COMPARISON:  Head CT dated 08/01/2020. FINDINGS: Brain: The ventricles and sulci are appropriate size for patient's age. Mild periventricular and deep white matter chronic microvascular ischemic changes noted. There is no acute intracranial hemorrhage. No mass effect or midline shift. No extra-axial fluid collection. Vascular: No hyperdense vessel or unexpected calcification. Skull: Normal. Negative for fracture or focal lesion. Sinuses/Orbits: No acute finding. Other: None IMPRESSION: 1. No acute intracranial pathology. 2. Mild chronic microvascular ischemic changes. Electronically Signed   By: Elgie CollardArash  Radparvar M.D.   On: 08/09/2020 19:11        Scheduled Meds: . diazepam  2 mg Oral BID  . feeding supplement  237 mL Oral Q24H  . folic acid  1 mg Oral Daily  . multivitamin with minerals  1 tablet Oral Daily  . nicotine  14 mg Transdermal Daily  . pantoprazole  40 mg Oral Daily  . thiamine  100 mg Oral Daily   Continuous Infusions: . sodium chloride    . ampicillin-sulbactam (UNASYN) IV 3 g (08/10/20 [id number])     LOS: 9 days    Time spent: 25 mins     Charise KillianJamiese M Socrates Cahoon, MD [facility] Pager [phone]  If 7PM-7AM, please contact night-coverage 08/10/2020, 1:57 PM

## 2020-08-10 NOTE — TOC Progression Note (Signed)
Transition of Care [facility]) - Progression Note    Patient Details  Name: Chad [name] MRN: [id number] Date of Birth: 10-Nov-1950  [facility]) CM/SW Contact  Maree Krabbe, LCSW Phone Number: 08/10/2020, 8:44 AM  Clinical Narrative:   CSW reached out to [facility] to let them know pt is ready for dc and after they have received auth they state they are rescinding the bed offer stating they can not meet his needs. That is pt's only bed offer. MD notified.    Expected Discharge Plan: Skilled Nursing Facility Barriers to Discharge: Barriers Resolved  Expected Discharge Plan and Services Expected Discharge Plan: Skilled Nursing Facility In-house Referral: Clinical Social Work   Post Acute Care Choice: Skilled Nursing Facility ([facility] , walker) Living arrangements for the past 2 months: Apartment Expected Discharge Date: 08/09/20               DME Arranged: N/A DME Agency: NA       HH Arranged: NA           Social Determinants of Health (SDOH) Interventions    Readmission Risk Interventions No flowsheet data found.

## 2020-08-10 NOTE — Progress Notes (Signed)
Pt moved from room 255 to 252. Pt had a fall in the room last night and was also caught smoking in the room. Moving pt closer to the nurses station in order to monitor better.

## 2020-08-10 NOTE — Plan of Care (Signed)
Pt Axox4. Calm and cooperative and able to voice his needs. Prn pain med adm per MD orders. Pt had a calm night except this am he was found smoking in his room. We could smell it in the hallway. The lighter and rest of cigarette was thrown away by staff including Charge RN and security called to search pt. No further cigarettes found. He agreed to use a nicotine patch which he was supposed to have on. Nicotine patch applied to L arm. NP Jon Billings and [facility] notified of the smoking incident. Security advised staff to inform day shift RN/crew that security needs to be contacted in the event a visitor comes in to be thoroughly searched before entering into pt room.   Problem: Education: Goal: Knowledge of General Education information will improve Description: Including pain rating scale, medication(s)/side effects and non-pharmacologic comfort measures Outcome: Progressing   Problem: Health Behavior/Discharge Planning: Goal: Ability to manage health-related needs will improve Outcome: Progressing   Problem: Clinical Measurements: Goal: Ability to maintain clinical measurements within normal limits will improve Outcome: Progressing Goal: Will remain free from infection Outcome: Progressing Goal: Diagnostic test results will improve Outcome: Progressing Goal: Respiratory complications will improve Outcome: Progressing Goal: Cardiovascular complication will be avoided Outcome: Progressing   Problem: Activity: Goal: Risk for activity intolerance will decrease Outcome: Progressing   Problem: Nutrition: Goal: Adequate nutrition will be maintained Outcome: Progressing   Problem: Coping: Goal: Level of anxiety will decrease Outcome: Progressing   Problem: Elimination: Goal: Will not experience complications related to bowel motility Outcome: Progressing Goal: Will not experience complications related to urinary retention Outcome: Progressing   Problem: Pain Managment: Goal: General  experience of comfort will improve Outcome: Progressing   Problem: Safety: Goal: Ability to remain free from injury will improve Outcome: Progressing   Problem: Skin Integrity: Goal: Risk for impaired skin integrity will decrease Outcome: Progressing

## 2020-08-11 ENCOUNTER — Other Ambulatory Visit: Payer: Self-pay

## 2020-08-11 LAB — COMPREHENSIVE METABOLIC PANEL
ALT: 20 U/L (ref 0–44)
AST: 30 U/L (ref 15–41)
Albumin: 2.4 g/dL — ABNORMAL LOW (ref 3.5–5.0)
Alkaline Phosphatase: 125 U/L (ref 38–126)
Anion gap: 10 (ref 5–15)
BUN: 9 mg/dL (ref 8–23)
CO2: 24 mmol/L (ref 22–32)
Calcium: 8.8 mg/dL — ABNORMAL LOW (ref 8.9–10.3)
Chloride: 103 mmol/L (ref 98–111)
Creatinine, Ser: 0.73 mg/dL (ref 0.61–1.24)
GFR, Estimated: >60 mL/min (ref >60)
Glucose, Bld: 100 mg/dL — ABNORMAL HIGH (ref 70–99)
Potassium: 4.1 mmol/L (ref 3.5–5.1)
Sodium: 137 mmol/L (ref 135–145)
Total Bilirubin: 0.6 mg/dL (ref 0.3–1.2)
Total Protein: 5.6 g/dL — ABNORMAL LOW (ref 6.5–8.1)

## 2020-08-11 LAB — CBC
HCT: 36.4 % — ABNORMAL LOW (ref 39.0–52.0)
Hemoglobin: 11.5 g/dL — ABNORMAL LOW (ref 13.0–17.0)
MCH: 31.6 pg (ref 26.0–34.0)
MCHC: 31.6 g/dL (ref 30.0–36.0)
MCV: 100 fL (ref 80.0–100.0)
Platelets: 207 10*3/uL (ref 150–400)
RBC: 3.64 MIL/uL — ABNORMAL LOW (ref 4.22–5.81)
RDW: 15.9 % — ABNORMAL HIGH (ref 11.5–15.5)
WBC: 4.8 10*3/uL (ref 4.0–10.5)
nRBC: 0 % (ref 0.0–0.2)

## 2020-08-11 NOTE — Progress Notes (Signed)
Pt transferred to room 209 from 2A.  VSS upon arrival.  Pt states minimal pain and denies need for meds at this time.  Oriented to room, call bell in reach, bed exit alarm on.  Initial assessment completed

## 2020-08-11 NOTE — TOC Progression Note (Signed)
Transition of Care [facility]) - Progression Note    Patient Details  Name: Chad [name] MRN: [id number] Date of Birth: May 24, 1950  Transition [facility] Care [facility]) CM/SW Contact  Liliana Cline, LCSW Phone Number: 08/11/2020, 4:19 PM  Clinical Narrative:   Patient just moved to 2C. No bed offers. CSW extended bed search.    Expected Discharge Plan: Skilled Nursing Facility Barriers to Discharge: No SNF bed  Expected Discharge Plan and Services Expected Discharge Plan: Skilled Nursing Facility In-house Referral: Clinical Social Work   Post Acute Care Choice: Skilled Nursing Facility (cain , walker) Living arrangements for the past 2 months: Apartment Expected Discharge Date: 08/09/20               DME Arranged: N/A DME Agency: NA       HH Arranged: NA           Social Determinants of Health (SDOH) Interventions    Readmission Risk Interventions No flowsheet data found.

## 2020-08-11 NOTE — TOC Progression Note (Signed)
Transition of Care [facility]) - Progression Note    Patient Details  Name: Chad [name] MRN: [id number] Date of Birth: Feb 10, 1951  Transition of Care [facility]) CM/SW Contact  Bing Quarry, RN Phone Number: 08/11/2020, 1:57 PM  Clinical Narrative:  Patient has multiple declines and a few pending bed referrals but no offers yet. 2 pm. 4/23. Gabriel Cirri RN CM     Expected Discharge Plan: Skilled Nursing Facility Barriers to Discharge: No SNF bed  Expected Discharge Plan and Services Expected Discharge Plan: Skilled Nursing Facility In-house Referral: Clinical Social Work   Post Acute Care Choice: Skilled Nursing Facility (cain , walker) Living arrangements for the past 2 months: Apartment Expected Discharge Date: 08/09/20               DME Arranged: N/A DME Agency: NA       HH Arranged: NA           Social Determinants of Health (SDOH) Interventions    Readmission Risk Interventions No flowsheet data found.

## 2020-08-11 NOTE — Progress Notes (Signed)
Mobility Specialist - Progress Note   08/11/20 1600  Mobility  Activity Ambulated in room  Level of Assistance Minimal assist, patient does 75% or more  Assistive Device Front wheel walker  Distance Ambulated (ft) 20 ft  Mobility Response Tolerated well  Mobility performed by Mobility specialist  $Mobility charge 1 Mobility    Pt ambulated in room with RW. No LOB. Does report dizziness while ambulating, limiting further activity. Denied SOB, O2 99% on RA. HR 109 bpm. Pt left in bed with alarm set.    Chad [name] Mobility Specialist 08/11/20, 4:35 PM

## 2020-08-11 NOTE — Progress Notes (Signed)
PROGRESS NOTE    Chad [name]  EPP:[id number] DOB: [birth year] DOA: 08/01/2020 PCP: Rayetta Humphrey, MD   Assessment & Plan:   Active Problems:   RUQ pain   Acute GI bleeding   Blood in stool   Duodenal ulcer without hemorrhage or perforation  Hemorrhagic shock: likely secondary to alcohol abuse & hx of gastric & duodenal ulcers. S/p 2 units of pRBCs transfused. Resolved  Gastric & duodenal ulcers: likely secondary to alcohol abuse. S/p EGD which showed nonbleeding gastric & duodenal ulcers on 08/02/20. Continue on PPI.   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 Continue w/ supportive care   Alcohol withdrawal: resolved. Drinks 1-2 pints of liquor daily. Continue on folate, thiamine. Alcohol cessation counseling   Hyponatremia: resolved  Hypomagnesemia: will monitor intermittently   Thrombocytopenia: resolved  Macrocytic anemia: H&H are stable. S/p 2 units of pRBCs transfused. Secondary to alcohol abuse    DVT prophylaxis: SCDs Code Status: full  Family Communication:  Disposition Plan: likely d/c to SNF   Level of care: Progressive Cardiac   Status is: Inpatient  Remains inpatient appropriate because:Unsafe d/c plan, IV treatments appropriate due to intensity of illness or inability to take PO and Inpatient level of care appropriate due to severity of illness, pt's only SNF bed rescinded their bed offer 08/10/20 so CM working on other options   Dispo: The patient is from: Home              Anticipated d/c is to: SNF              Patient currently is medically stable to d/c.   Difficult to place patient No        Consultants:   GI    Procedures: EGD   Antimicrobials:    Subjective: Pt c/o fatigue   Objective: Vitals:   08/10/20 0822 08/10/20 1159 08/10/20 1741 08/11/20 0447  BP: (!) 111/54 115/68 (!) 144/81 121/69  Pulse: 65 64 76 69  Resp:  14  18  Temp: 98.4 F (36.9 C) 98 F  (36.7 C) 98.2 F (36.8 C) 98.5 F (36.9 C)  TempSrc: Oral Oral Oral   SpO2: 95% 97% 100% 99%  Weight:      Height:        Intake/Output Summary (Last 24 hours) at 08/11/2020 0757 Last data filed at 08/11/2020 0700 Gross per 24 hour  Intake --  Output 700 ml  Net -700 ml   Filed Weights   08/09/20 0417 08/09/20 0453 08/10/20 0500  Weight: 70 kg 70 kg 70.2 kg    Examination:  General exam: Appears comfortable.  Respiratory system: clear breath sounds b/l  Cardiovascular system: S1/S2+. No rubs or clicks  Gastrointestinal system: Abd is soft, NT, ND & normal bowel sounds  Central nervous system: Alert and oriented. Moves all extremities Psychiatry: Judgement and insight appear normal. Flat mood and affect     Data Reviewed: I have personally reviewed following labs and imaging studies  CBC: Recent Labs  Lab 08/07/20 0613 08/08/20 0501 08/09/20 0524 08/10/20 0452 08/11/20 0537  WBC 4.1 5.2 4.4 4.7 4.8  HGB 11.6* 10.7* 11.3* 10.6* 11.5*  HCT 35.3* 33.4* 35.7* 32.0* 36.4*  MCV 97.5 97.9 100.3* 97.9 100.0  PLT 139* 164 180 201 207   Basic Metabolic Panel: Recent Labs  Lab 08/04/20 1028 08/05/20 0635 08/06/20 0527 08/07/20 0613 08/09/20 0524 08/10/20 0452 08/11/20 0537  NA 134* 139 135 139 137 137  137  K 3.9 3.9 3.9 4.4 4.1 4.0 4.1  CL 103 105 103 106 104 105 103  CO2 23 26 25 27 24 24 24   GLUCOSE 107* 88 103* 88 90 90 100*  BUN 9 6* 6* 8 12 10 9   CREATININE 0.74 0.62 0.76 0.78 0.77 0.76 0.73  CALCIUM 7.6* 7.9* 8.1* 8.3* 8.5* 8.3* 8.8*  MG 1.7 2.0 2.0  --   --   --   --   PHOS 3.8  --  4.2  --   --   --   --    GFR: Estimated Creatinine Clearance: 86.7 mL/min (by C-G formula based on SCr of 0.73 mg/dL). Liver Function Tests: Recent Labs  Lab 08/05/20 0635 08/06/20 0527 08/07/20 0613 08/09/20 0524 08/10/20 0452 08/11/20 0537  AST 70*  --  48* 41 33 30  ALT 36  --  32 26 21 20   ALKPHOS 130*  --  145* 139* 123 125  BILITOT 1.1  --  0.7 0.7 0.7  0.6  PROT 4.5*  --  4.8* 5.3* 5.0* 5.6*  ALBUMIN 2.1* 2.3* 2.2* 2.4* 2.3* 2.4*   No results for input(s): LIPASE, AMYLASE in the last 168 hours. No results for input(s): AMMONIA in the last 168 hours. Coagulation Profile: No results for input(s): INR, PROTIME in the last 168 hours. Cardiac Enzymes: No results for input(s): CKTOTAL, CKMB, CKMBINDEX, TROPONINI in the last 168 hours. BNP (last 3 results) No results for input(s): PROBNP in the last 8760 hours. HbA1C: No results for input(s): HGBA1C in the last 72 hours. CBG: No results for input(s): GLUCAP in the last 168 hours. Lipid Profile: No results for input(s): CHOL, HDL, LDLCALC, TRIG, CHOLHDL, LDLDIRECT in the last 72 hours. Thyroid Function Tests: No results for input(s): TSH, T4TOTAL, FREET4, T3FREE, THYROIDAB in the last 72 hours. Anemia Panel: No results for input(s): VITAMINB12, FOLATE, FERRITIN, TIBC, IRON, RETICCTPCT in the last 72 hours. Sepsis Labs: No results for input(s): PROCALCITON, LATICACIDVEN in the last 168 hours.  Recent Results (from the past 240 hour(s))  Resp Panel by RT-PCR (Flu A&B, Covid) Nasopharyngeal Swab     Status: None   Collection Time: 08/01/20  1:43 PM   Specimen: Nasopharyngeal Swab; Nasopharyngeal(NP) swabs in vial transport medium  Result Value Ref Range Status   SARS Coronavirus 2 by RT PCR NEGATIVE NEGATIVE Final    Comment: (NOTE) SARS-CoV-2 target nucleic acids are NOT DETECTED.  The SARS-CoV-2 RNA is generally detectable in upper respiratory specimens during the acute phase of infection. The lowest concentration of SARS-CoV-2 viral copies this assay can detect is 138 copies/mL. A negative result does not preclude SARS-Cov-2 infection and should not be used as the sole basis for treatment or other patient management decisions. A negative result may occur with  improper specimen collection/handling, submission of specimen other than nasopharyngeal swab, presence of viral mutation(s)  within the areas targeted by this assay, and inadequate number of viral copies(<138 copies/mL). A negative result must be combined with clinical observations, patient history, and epidemiological information. The expected result is Negative.  Fact Sheet for Patients:  BloggerCourse.comhttps://www.fda.gov/media/152166/download  Fact Sheet for Healthcare Providers:  SeriousBroker.ithttps://www.fda.gov/media/152162/download  This test is no t yet approved or cleared by the [place] FDA and  has been authorized for detection and/or diagnosis of SARS-CoV-2 by FDA under an Emergency Use Authorization (EUA). This EUA will remain  in effect (meaning this test can be used) for the duration of the COVID-19 declaration under Section 564(b)(1)  of the Act, 21 U.S.C.section 360bbb-3(b)(1), unless the authorization is terminated  or revoked sooner.       Influenza A by PCR NEGATIVE NEGATIVE Final   Influenza B by PCR NEGATIVE NEGATIVE Final    Comment: (NOTE) The Xpert Xpress SARS-CoV-2/FLU/RSV plus assay is intended as an aid in the diagnosis of influenza from Nasopharyngeal swab specimens and should not be used as a sole basis for treatment. Nasal washings and aspirates are unacceptable for Xpert Xpress SARS-CoV-2/FLU/RSV testing.  Fact Sheet for Patients: BloggerCourse.com  Fact Sheet for Healthcare Providers: SeriousBroker.it  This test is not yet approved or cleared by the [place] FDA and has been authorized for detection and/or diagnosis of SARS-CoV-2 by FDA under an Emergency Use Authorization (EUA). This EUA will remain in effect (meaning this test can be used) for the duration of the COVID-19 declaration under Section 564(b)(1) of the Act, 21 U.S.C. section 360bbb-3(b)(1), unless the authorization is terminated or revoked.  Performed at [facility], [address] [place], [place] [address]   Resp Panel by RT-PCR (Flu A&B, Covid)  Nasopharyngeal Swab     Status: None   Collection Time: 08/09/20  2:40 PM   Specimen: Nasopharyngeal Swab; Nasopharyngeal(NP) swabs in vial transport medium  Result Value Ref Range Status   SARS Coronavirus 2 by RT PCR NEGATIVE NEGATIVE Final    Comment: (NOTE) SARS-CoV-2 target nucleic acids are NOT DETECTED.  The SARS-CoV-2 RNA is generally detectable in upper respiratory specimens during the acute phase of infection. The lowest concentration of SARS-CoV-2 viral copies this assay can detect is 138 copies/mL. A negative result does not preclude SARS-Cov-2 infection and should not be used as the sole basis for treatment or other patient management decisions. A negative result may occur with  improper specimen collection/handling, submission of specimen other than nasopharyngeal swab, presence of viral mutation(s) within the areas targeted by this assay, and inadequate number of viral copies(<138 copies/mL). A negative result must be combined with clinical observations, patient history, and epidemiological information. The expected result is Negative.  Fact Sheet for Patients:  BloggerCourse.com  Fact Sheet for Healthcare Providers:  SeriousBroker.it  This test is no t yet approved or cleared by the [place] FDA and  has been authorized for detection and/or diagnosis of SARS-CoV-2 by FDA under an Emergency Use Authorization (EUA). This EUA will remain  in effect (meaning this test can be used) for the duration of the COVID-19 declaration under Section 564(b)(1) of the Act, 21 U.S.C.section 360bbb-3(b)(1), unless the authorization is terminated  or revoked sooner.       Influenza A by PCR NEGATIVE NEGATIVE Final   Influenza B by PCR NEGATIVE NEGATIVE Final    Comment: (NOTE) The Xpert Xpress SARS-CoV-2/FLU/RSV plus assay is intended as an aid in the diagnosis of influenza from Nasopharyngeal swab specimens and should not be  used as a sole basis for treatment. Nasal washings and aspirates are unacceptable for Xpert Xpress SARS-CoV-2/FLU/RSV testing.  Fact Sheet for Patients: BloggerCourse.com  Fact Sheet for Healthcare Providers: SeriousBroker.it  This test is not yet approved or cleared by the [place] FDA and has been authorized for detection and/or diagnosis of SARS-CoV-2 by FDA under an Emergency Use Authorization (EUA). This EUA will remain in effect (meaning this test can be used) for the duration of the COVID-19 declaration under Section 564(b)(1) of the Act, 21 U.S.C. section 360bbb-3(b)(1), unless the authorization is terminated or revoked.  Performed at [facility], [address] [place], [place] [address]          Radiology Studies: DG Thoracic Spine 2 View  Result Date: 08/09/2020 CLINICAL DATA:  Low back pain after fall EXAM: THORACIC SPINE 2 VIEWS COMPARISON:  CT 02/04/2020 FINDINGS: Mild thoracic kyphosis. Minimal chronic wedging of mid to lower thoracic vertebra. Degenerative osteophytes. No acute osseous abnormality IMPRESSION: No acute osseous abnormality Electronically Signed   By: Jasmine Pang M.D.   On: 08/09/2020 19:25   DG Lumbar Spine 2-3 Views  Result Date: 08/09/2020 CLINICAL DATA:  Pain after fall EXAM: LUMBAR SPINE - 2-3 VIEW COMPARISON:  05/27/2016 FINDINGS: Post fusion changes at L4-L5. Lumbar alignment within normal limits. Vertebral body heights are maintained. Mild to moderate disc space narrowing and degenerative change at L1-L2 and L2-L3. Mild disc space narrowing at L5-S1. Aortic atherosclerosis IMPRESSION: No acute osseous abnormality. Electronically Signed   By: Jasmine Pang M.D.   On: 08/09/2020 19:26   CT HEAD WO CONTRAST  Result Date: 08/09/2020 CLINICAL DATA:  70 year old male with head trauma. EXAM: CT HEAD WITHOUT CONTRAST TECHNIQUE: Contiguous axial images were obtained from the base of  the skull through the vertex without intravenous contrast. COMPARISON:  Head CT dated 08/01/2020. FINDINGS: Brain: The ventricles and sulci are appropriate size for patient's age. Mild periventricular and deep white matter chronic microvascular ischemic changes noted. There is no acute intracranial hemorrhage. No mass effect or midline shift. No extra-axial fluid collection. Vascular: No hyperdense vessel or unexpected calcification. Skull: Normal. Negative for fracture or focal lesion. Sinuses/Orbits: No acute finding. Other: None IMPRESSION: 1. No acute intracranial pathology. 2. Mild chronic microvascular ischemic changes. Electronically Signed   By: Elgie Collard M.D.   On: 08/09/2020 19:11        Scheduled Meds: . diazepam  2 mg Oral BID  . feeding supplement  237 mL Oral Q24H  . folic acid  1 mg Oral Daily  . multivitamin with minerals  1 tablet Oral Daily  . nicotine  14 mg Transdermal Daily  . pantoprazole  40 mg Oral Daily  . thiamine  100 mg Oral Daily   Continuous Infusions: . sodium chloride       LOS: 10 days    Time spent: 25 mins     Charise Killian, MD [facility] Pager [phone]  If 7PM-7AM, please contact night-coverage 08/11/2020, 7:57 AM

## 2020-08-12 ENCOUNTER — Inpatient Hospital Stay: Payer: Medicare HMO

## 2020-08-12 LAB — CBC
HCT: 32.5 % — ABNORMAL LOW (ref 39.0–52.0)
Hemoglobin: 10.4 g/dL — ABNORMAL LOW (ref 13.0–17.0)
MCH: 31.7 pg (ref 26.0–34.0)
MCHC: 32 g/dL (ref 30.0–36.0)
MCV: 99.1 fL (ref 80.0–100.0)
Platelets: 237 10*3/uL (ref 150–400)
RBC: 3.28 MIL/uL — ABNORMAL LOW (ref 4.22–5.81)
RDW: 15.5 % (ref 11.5–15.5)
WBC: 5.4 10*3/uL (ref 4.0–10.5)
nRBC: 0 % (ref 0.0–0.2)

## 2020-08-12 LAB — COMPREHENSIVE METABOLIC PANEL
ALT: 18 U/L (ref 0–44)
AST: 28 U/L (ref 15–41)
Albumin: 2.3 g/dL — ABNORMAL LOW (ref 3.5–5.0)
Alkaline Phosphatase: 116 U/L (ref 38–126)
Anion gap: 8 (ref 5–15)
BUN: 11 mg/dL (ref 8–23)
CO2: 24 mmol/L (ref 22–32)
Calcium: 8.6 mg/dL — ABNORMAL LOW (ref 8.9–10.3)
Chloride: 105 mmol/L (ref 98–111)
Creatinine, Ser: 0.81 mg/dL (ref 0.61–1.24)
GFR, Estimated: >60 mL/min (ref >60)
Glucose, Bld: 101 mg/dL — ABNORMAL HIGH (ref 70–99)
Potassium: 4.1 mmol/L (ref 3.5–5.1)
Sodium: 137 mmol/L (ref 135–145)
Total Bilirubin: 0.4 mg/dL (ref 0.3–1.2)
Total Protein: 5.3 g/dL — ABNORMAL LOW (ref 6.5–8.1)

## 2020-08-12 LAB — GLUCOSE, CAPILLARY: Glucose-Capillary: 99 mg/dL (ref 70–99)

## 2020-08-12 LAB — MAGNESIUM: Magnesium: 2 mg/dL (ref 1.7–2.4)

## 2020-08-12 NOTE — TOC Progression Note (Signed)
Transition of Care [facility]) - Progression Note    Patient Details  Name: Chad [name] MRN: [id number] Date of Birth: Apr 25, 1950  [facility] CM/SW Contact  Liliana Cline, LCSW Phone Number: 08/12/2020, 1:00 PM  Clinical Narrative:   Patient has bed offers at [facility] and [facility]. CSW presented bed offers to patient with medicare.gov ratings. Patient says he does not want to go to these SNFs because they are too far away. Explained that currently these are the only bed offers and asked what patient's plan is if a SNF closer to home does not accept him. Patient says "wait until one does." Explained that unfortunately we cannot guarantee additional bed offers and since patient is medically ready he would need to choose one or come up with another plan. Patient says he will think about it.   Expected Discharge Plan: Skilled Nursing Facility Barriers to Discharge: No SNF bed  Expected Discharge Plan and Services Expected Discharge Plan: Skilled Nursing Facility In-house Referral: Clinical Social Work   Post Acute Care Choice: Skilled Nursing Facility (cain , walker) Living arrangements for the past 2 months: Apartment Expected Discharge Date: 08/09/20               DME Arranged: N/A DME Agency: NA       HH Arranged: NA           Social Determinants of Health (SDOH) Interventions    Readmission Risk Interventions No flowsheet data found.

## 2020-08-12 NOTE — Progress Notes (Signed)
PROGRESS NOTE    Chad [name]  BVQ:[id number] DOB: February 05, 1951 DOA: 08/01/2020 PCP: Rayetta Humphrey, MD   Assessment & Plan:   Active Problems:   RUQ pain   Acute GI bleeding   Blood in stool   Duodenal ulcer without hemorrhage or perforation  Hemorrhagic shock: likely secondary to alcohol abuse & hx of gastric & duodenal ulcers. S/p 2 units of pRBCs transfused. Resolved  Gastric & duodenal ulcers: likely secondary to alcohol abuse. S/p EGD which showed nonbleeding gastric & duodenal ulcers on 08/02/20. 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 Continue w/ supportive care   Alcohol withdrawal: resolved. Drinks 1-2 pints of liquor daily. Continue on folate, thiamine. Alcohol cessation counseling   Hyponatremia: resolved  Hypomagnesemia: WNL   Thrombocytopenia: resolved   Macrocytic anemia: H&H are labile. S/p 2 units of pRBCs transfused. Secondary to alcohol abuse      DVT prophylaxis: SCDs Code Status: full  Family Communication:  Disposition Plan: likely d/c to SNF   Level of care: Med-Surg   Status is: Inpatient  Remains inpatient appropriate because:Unsafe d/c plan, IV treatments appropriate due to intensity of illness or inability to take PO and Inpatient level of care appropriate due to severity of illness, pt's only SNF bed rescinded their bed offer 08/10/20 so CM working on other options   Dispo: The patient is from: Home              Anticipated d/c is to: SNF              Patient currently is medically stable to d/c.   Difficult to place patient: yes        Consultants:   GI    Procedures: EGD   Antimicrobials:    Subjective: Pt c/o malaise   Objective: Vitals:   08/11/20 1532 08/11/20 1949 08/12/20 0438 08/12/20 0500  BP: 113/67 93/64 95/63    Pulse: 83 90 76   Resp: 18 20 20    Temp: 98 F (36.7 C) 97.6 F (36.4 C) 98 F (36.7 C)   TempSrc:  Oral Oral Oral   SpO2: 96% 98% 96%   Weight:    68.4 kg  Height:        Intake/Output Summary (Last 24 hours) at 08/12/2020 0729 Last data filed at 08/12/2020 0540 Gross per 24 hour  Intake 960 ml  Output 800 ml  Net 160 ml   Filed Weights   08/09/20 0453 08/10/20 0500 08/12/20 0500  Weight: 70 kg 70.2 kg 68.4 kg    Examination:  General exam: Appears calm & comfortable Respiratory system: clear breath sounds b/l. No rales  Cardiovascular system: S1 & S2+. No gallops or clicks  Gastrointestinal system: Abd is soft, NT, ND & normal bowel sounds  Central nervous system: Alert and oriented. Moves all extremities  Psychiatry: Judgement and insight appear normal. Flat mood and affect     Data Reviewed: I have personally reviewed following labs and imaging studies  CBC: Recent Labs  Lab 08/08/20 0501 08/09/20 0524 08/10/20 0452 08/11/20 0537 08/12/20 0407  WBC 5.2 4.4 4.7 4.8 5.4  HGB 10.7* 11.3* 10.6* 11.5* 10.4*  HCT 33.4* 35.7* 32.0* 36.4* 32.5*  MCV 97.9 100.3* 97.9 100.0 99.1  PLT 164 180 201 207 237   Basic Metabolic Panel: Recent Labs  Lab 08/06/20 0527 08/07/20 0613 08/09/20 0524 08/10/20 0452 08/11/20 0537 08/12/20 0407  NA 135 139 137 137 137  137  K 3.9 4.4 4.1 4.0 4.1 4.1  CL 103 106 104 105 103 105  CO2 25 27 24 24 24 24   GLUCOSE 103* 88 90 90 100* 101*  BUN 6* 8 12 10 9 11   CREATININE 0.76 0.78 0.77 0.76 0.73 0.81  CALCIUM 8.1* 8.3* 8.5* 8.3* 8.8* 8.6*  MG 2.0  --   --   --   --  2.0  PHOS 4.2  --   --   --   --   --    GFR: Estimated Creatinine Clearance: 85.6 mL/min (by C-G formula based on SCr of 0.81 mg/dL). Liver Function Tests: Recent Labs  Lab 08/07/20 0613 08/09/20 0524 08/10/20 0452 08/11/20 0537 08/12/20 0407  AST 48* 41 33 30 28  ALT 32 26 21 20 18   ALKPHOS 145* 139* 123 125 116  BILITOT 0.7 0.7 0.7 0.6 0.4  PROT 4.8* 5.3* 5.0* 5.6* 5.3*  ALBUMIN 2.2* 2.4* 2.3* 2.4* 2.3*   No results for input(s): LIPASE, AMYLASE in the  last 168 hours. No results for input(s): AMMONIA in the last 168 hours. Coagulation Profile: No results for input(s): INR, PROTIME in the last 168 hours. Cardiac Enzymes: No results for input(s): CKTOTAL, CKMB, CKMBINDEX, TROPONINI in the last 168 hours. BNP (last 3 results) No results for input(s): PROBNP in the last 8760 hours. HbA1C: No results for input(s): HGBA1C in the last 72 hours. CBG: No results for input(s): GLUCAP in the last 168 hours. Lipid Profile: No results for input(s): CHOL, HDL, LDLCALC, TRIG, CHOLHDL, LDLDIRECT in the last 72 hours. Thyroid Function Tests: No results for input(s): TSH, T4TOTAL, FREET4, T3FREE, THYROIDAB in the last 72 hours. Anemia Panel: No results for input(s): VITAMINB12, FOLATE, FERRITIN, TIBC, IRON, RETICCTPCT in the last 72 hours. Sepsis Labs: No results for input(s): PROCALCITON, LATICACIDVEN in the last 168 hours.  Recent Results (from the past 240 hour(s))  Resp Panel by RT-PCR (Flu A&B, Covid) Nasopharyngeal Swab     Status: None   Collection Time: 08/09/20  2:40 PM   Specimen: Nasopharyngeal Swab; Nasopharyngeal(NP) swabs in vial transport medium  Result Value Ref Range Status   SARS Coronavirus 2 by RT PCR NEGATIVE NEGATIVE Final    Comment: (NOTE) SARS-CoV-2 target nucleic acids are NOT DETECTED.  The SARS-CoV-2 RNA is generally detectable in upper respiratory specimens during the acute phase of infection. The lowest concentration of SARS-CoV-2 viral copies this assay can detect is 138 copies/mL. A negative result does not preclude SARS-Cov-2 infection and should not be used as the sole basis for treatment or other patient management decisions. A negative result may occur with  improper specimen collection/handling, submission of specimen other than nasopharyngeal swab, presence of viral mutation(s) within the areas targeted by this assay, and inadequate number of viral copies(<138 copies/mL). A negative result must be combined  with clinical observations, patient history, and epidemiological information. The expected result is Negative.  Fact Sheet for Patients:  08/14/20  Fact Sheet for Healthcare Providers:   This test is no t yet approved or cleared by the 08/11/20 FDA and  has been authorized for detection and/or diagnosis of SARS-CoV-2 by FDA under an Emergency Use Authorization (EUA). This EUA will remain  in effect (meaning this test can be used) for the duration of the COVID-19 declaration under Section 564(b)(1) of the Act, 21 U.S.C.section 360bbb-3(b)(1), unless the authorization is terminated  or revoked sooner.       Influenza A by PCR NEGATIVE NEGATIVE  Final   Influenza B by PCR NEGATIVE NEGATIVE Final    Comment: (NOTE) The Xpert Xpress SARS-CoV-2/FLU/RSV plus assay is intended as an aid in the diagnosis of influenza from Nasopharyngeal swab specimens and should not be used as a sole basis for treatment. Nasal washings and aspirates are unacceptable for Xpert Xpress SARS-CoV-2/FLU/RSV testing.  Fact Sheet for Patients: BloggerCourse.com  Fact Sheet for Healthcare Providers: SeriousBroker.it  This test is not yet approved or cleared by the [place] FDA and has been authorized for detection and/or diagnosis of SARS-CoV-2 by FDA under an Emergency Use Authorization (EUA). This EUA will remain in effect (meaning this test can be used) for the duration of the COVID-19 declaration under Section 564(b)(1) of the Act, 21 U.S.C. section 360bbb-3(b)(1), unless the authorization is terminated or revoked.  Performed at [facility], [address] [place], [place] [address]          Radiology Studies: No results found.      Scheduled Meds: . diazepam  2 mg Oral BID  . feeding supplement  237 mL Oral Q24H  . folic acid  1 mg Oral Daily   . multivitamin with minerals  1 tablet Oral Daily  . nicotine  14 mg Transdermal Daily  . pantoprazole  40 mg Oral Daily  . thiamine  100 mg Oral Daily   Continuous Infusions: . sodium chloride       LOS: 11 days    Time spent: 25 mins     Charise Killian, MD [facility] Pager [phone]  If 7PM-7AM, please contact night-coverage 08/12/2020, 7:29 AM

## 2020-08-12 NOTE — TOC Progression Note (Addendum)
Transition of Care [facility]) - Progression Note    Patient Details  Name: Chad [name] MRN: [id number] Date of Birth: 1950-07-29  Transition of Care [facility]) CM/SW Contact  Liliana Cline, LCSW Phone Number: 08/12/2020, 2:31 PM  Clinical Narrative:   Patient requested to speak with CSW. CSW spoke with pateint via phone. Patient says he decided he would like to go home with Home Health, but does not want to DC until Tuesday. CSW informed patient that DC date is based on MD and when patient is medically ready but CSW will relay this to MD. CSW confirmed home address in the chart. Explained agency options, patient says he has no agency preference. CSW asked if he feels safe returning home, he said "no, I can't walk", but that he would "rather do that than go to [facility] or [facility]" (currently only SNF bed offers). Informed patient I have sent his information to all SNFs in our system to see if he will get any additional bed offers. CSW also reached out to [place] directly at [employer] as per [facility] notes this was patient's preference, asked her to review referral. Updated MD and RN.  2:45- Spoke with [facility] cannot accept patient due to not accepting [facility].  Expected Discharge Plan: Skilled Nursing Facility Barriers to Discharge: No SNF bed  Expected Discharge Plan and Services Expected Discharge Plan: Skilled Nursing Facility In-house Referral: Clinical Social Work   Post Acute Care Choice: Skilled Nursing Facility ([facility] , walker) Living arrangements for the past 2 months: Apartment Expected Discharge Date: 08/09/20               DME Arranged: N/A DME Agency: NA       HH Arranged: NA           Social Determinants of Health (SDOH) Interventions    Readmission Risk Interventions No flowsheet data found.

## 2020-08-12 NOTE — Progress Notes (Signed)
In room, noted to have left side facial droop, somewhat slurred speech, but smile/frown is symmetrical in strength, eye brows equal with rise, bilateral arms and legs equal in strength +2 with push, pull, dorsiflexion and plantar flexion. Alert and oriented x4. CCU charge and CN evaluate patient. Contacted MD who ordered CT scan. CBG and VS WNL.

## 2020-08-12 NOTE — Plan of Care (Signed)
Patient had an uneventful shift. No changes in neurological and neurovascular assessments. Pain controlled with PRN medications. Vital signs within normal range.Denies any needs at this time. All Safety measures maintained. Care continues.  Problem: Education: Goal: Knowledge of General Education information will improve Description: Including pain rating scale, medication(s)/side effects and non-pharmacologic comfort measures Outcome: Progressing   Problem: Health Behavior/Discharge Planning: Goal: Ability to manage health-related needs will improve Outcome: Progressing   Problem: Clinical Measurements: Goal: Ability to maintain clinical measurements within normal limits will improve Outcome: Progressing Goal: Will remain free from infection Outcome: Progressing Goal: Diagnostic test results will improve Outcome: Progressing Goal: Respiratory complications will improve Outcome: Progressing Goal: Cardiovascular complication will be avoided Outcome: Progressing   Problem: Activity: Goal: Risk for activity intolerance will decrease Outcome: Progressing   Problem: Nutrition: Goal: Adequate nutrition will be maintained Outcome: Progressing   Problem: Coping: Goal: Level of anxiety will decrease Outcome: Progressing   Problem: Elimination: Goal: Will not experience complications related to bowel motility Outcome: Progressing Goal: Will not experience complications related to urinary retention Outcome: Progressing   Problem: Pain Managment: Goal: General experience of comfort will improve Outcome: Progressing   Problem: Safety: Goal: Ability to remain free from injury will improve Outcome: Progressing   Problem: Skin Integrity: Goal: Risk for impaired skin integrity will decrease Outcome: Progressing   

## 2020-08-13 LAB — COMPREHENSIVE METABOLIC PANEL
ALT: 19 U/L (ref 0–44)
AST: 27 U/L (ref 15–41)
Albumin: 2.5 g/dL — ABNORMAL LOW (ref 3.5–5.0)
Alkaline Phosphatase: 111 U/L (ref 38–126)
Anion gap: 8 (ref 5–15)
BUN: 12 mg/dL (ref 8–23)
CO2: 25 mmol/L (ref 22–32)
Calcium: 8.6 mg/dL — ABNORMAL LOW (ref 8.9–10.3)
Chloride: 104 mmol/L (ref 98–111)
Creatinine, Ser: 0.79 mg/dL (ref 0.61–1.24)
GFR, Estimated: >60 mL/min (ref >60)
Glucose, Bld: 106 mg/dL — ABNORMAL HIGH (ref 70–99)
Potassium: 3.9 mmol/L (ref 3.5–5.1)
Sodium: 137 mmol/L (ref 135–145)
Total Bilirubin: 0.5 mg/dL (ref 0.3–1.2)
Total Protein: 5.8 g/dL — ABNORMAL LOW (ref 6.5–8.1)

## 2020-08-13 LAB — CBC
HCT: 33.5 % — ABNORMAL LOW (ref 39.0–52.0)
Hemoglobin: 10.8 g/dL — ABNORMAL LOW (ref 13.0–17.0)
MCH: 31.7 pg (ref 26.0–34.0)
MCHC: 32.2 g/dL (ref 30.0–36.0)
MCV: 98.2 fL (ref 80.0–100.0)
Platelets: 255 10*3/uL (ref 150–400)
RBC: 3.41 MIL/uL — ABNORMAL LOW (ref 4.22–5.81)
RDW: 15.2 % (ref 11.5–15.5)
WBC: 5.3 10*3/uL (ref 4.0–10.5)
nRBC: 0 % (ref 0.0–0.2)

## 2020-08-13 LAB — MAGNESIUM: Magnesium: 1.8 mg/dL (ref 1.7–2.4)

## 2020-08-13 NOTE — NC FL2 (Signed)
[place] MEDICAID FL2 LEVEL OF CARE SCREENING TOOL     IDENTIFICATION  Patient Name: Chad [name] Birthdate: 12-Sep-1950 Sex: male Admission Date (Current Location): 08/01/2020  [place] and [place] Number:  Chiropodist and Address:  [facility], [address] [place], [place] [address]      Provider Number: [id number]  Attending Physician Name and Address:  Charise Killian, MD  Relative Name and Phone Number:  elaine shepherd [phone]    Current Level of Care: Hospital Recommended Level of Care: Skilled Nursing Facility Prior Approval Number:    Date Approved/Denied:   PASRR Number: Manual review  Discharge Plan: SNF    Current Diagnoses: Patient Active Problem List   Diagnosis Date Noted  . Blood in stool   . Duodenal ulcer without hemorrhage or perforation   . RUQ pain 08/01/2020  . Acute GI bleeding 08/01/2020  . Shock (HCC)   . Abdominal pain   . GI bleeding 05/02/2020  . Severe recurrent major depression without psychotic features (HCC) 02/08/2020  . 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 Hypokalemia 02/07/2020  . Hypophosphatemia 02/07/2020  . Lactic acidosis 02/04/2020  . Dehydration 02/04/2020  . Generalized weakness 02/04/2020  . Alcohol abuse with intoxication delirium (HCC)   . Anemia   . Closed hip fracture requiring operative repair with routine healing 01/14/2020  . Calculus of gallbladder with acute cholecystitis without obstruction   . GI bleed 12/31/2019  . GIB (gastrointestinal bleeding) 12/31/2019  . Epigastric pain   . Chest pain   . Cough   . Tobacco abuse   . Alcohol abuse 02/20/2019  . Alcoholic ketoacidosis 01/23/2019  . Alcohol dependence with uncomplicated withdrawal (HCC) 01/23/2019  . 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 Depression with anxiety 01/23/2019  . Acute hypoactive delirium due to multiple etiologies 10/20/2018  . Aspiration pneumonia (HCC) 10/20/2018  .  Aspiration into airway 10/18/2018  . Wernicke's encephalopathy 10/18/2018  . Acute alcoholic liver disease 10/16/2018  . Severe protein-calorie malnutrition (HCC) 10/16/2018  . HTN (hypertension) 05/17/2018  . Alcohol withdrawal (HCC) 05/17/2018  . DVT (deep venous thrombosis) (HCC) 05/17/2018  . Major depressive disorder, recurrent, moderate (HCC) 12/29/2017  . Primary osteoarthritis of both hands 12/29/2017  . Psoriasis 12/29/2017  . 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 Viral warts 08/21/2015  . 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 Epidermal inclusion cyst 11/04/2013  . Hepatitis C antibody test positive 10/31/2013  . 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 History of prescription drug abuse 12/23/2012  . BPH (benign prostatic hyperplasia) 12/15/2011  . Tests ordered 12/15/2011  . Chronic shoulder pain 12/12/2011  . ED (erectile dysfunction) 12/12/2011  . GI bleed due to NSAIDs 12/12/2011  . Lumbar herniated disc 12/12/2011  . Rash 12/12/2011  . BACK PAIN, LUMBAR 11/10/2006    Orientation RESPIRATION BLADDER Height & Weight     Self,Time,Situation,Place  Normal Continent Weight: 150 lb 12.7 oz (68.4 kg) Height:  5\' 8"  (172.7 cm)  BEHAVIORAL SYMPTOMS/MOOD NEUROLOGICAL BOWEL NUTRITION STATUS   (None)  (None) Continent Diet (Regular. Please Cut spaghetti, tougher meats (not fish or meatloaf).  Add Gravies to meats, potatoes.)  AMBULATORY STATUS COMMUNICATION OF NEEDS Skin   Limited Assist Verbally Skin abrasions                       Personal Care Assistance Level of Assistance  Bathing,Feeding,Dressing Bathing Assistance: Limited assistance Feeding assistance: Limited assistance Dressing Assistance: Limited assistance  Functional Limitations Info  Sight,Hearing,Speech Sight Info: Adequate Hearing Info: Adequate Speech Info: Adequate    SPECIAL CARE FACTORS FREQUENCY  PT (By licensed PT),OT (By licensed OT)     PT Frequency: 5 x week OT  Frequency: 5 x week            Contractures Contractures Info: Not present    Additional Factors Info  Code Status,Allergies Code Status Info: Full code Allergies Info: Morphine and related, Ibuprofen           Current Medications (08/13/2020):  This is the current hospital active medication list Current Facility-Administered Medications  Medication Dose Route Frequency Provider Last Rate Last Admin  . 0.9 %  sodium chloride infusion  10 mL/hr Intravenous Once Sharman Cheek, MD      . ALPRAZolam Prudy Feeler) tablet 0.25 mg  0.25 mg Oral BID PRN Charise Killian, MD   0.25 mg at 08/11/20 1144  . diazepam (VALIUM) tablet 2 mg  2 mg Oral BID Zannie Cove, MD   2 mg at 08/13/20 3235  . diphenhydrAMINE-zinc acetate (BENADRYL) 2-0.1 % cream   Topical Daily PRN Charise Killian, MD      . docusate sodium (COLACE) capsule 100 mg  100 mg Oral BID PRN Eugenie Norrie, NP   100 mg at 08/08/20 0859  . feeding supplement (ENSURE ENLIVE / ENSURE PLUS) liquid 237 mL  237 mL Oral Q24H Charise Killian, MD   237 mL at 08/12/20 1551  . folic acid (FOLVITE) tablet 1 mg  1 mg Oral Daily Zannie Cove, MD   1 mg at 08/13/20 0827  . guaiFENesin-dextromethorphan (ROBITUSSIN DM) 100-10 MG/5ML syrup 5 mL  5 mL Oral Q4H PRN Charise Killian, MD   5 mL at 08/12/20 1829  . HYDROcodone-acetaminophen (NORCO/VICODIN) 5-325 MG per tablet 1 tablet  1 tablet Oral Q6H PRN Zannie Cove, MD   1 tablet at 08/13/20 0830  . ipratropium-albuterol (DUONEB) 0.5-2.5 (3) MG/3ML nebulizer solution 3 mL  3 mL Nebulization Q6H PRN Zannie Cove, MD      . multivitamin with minerals tablet 1 tablet  1 tablet Oral Daily Charise Killian, MD   1 tablet at 08/13/20 [phone]  . nicotine (NICODERM CQ - dosed in mg/24 hours) patch 14 mg  14 mg Transdermal Daily Zannie Cove, MD   14 mg at 08/12/20 0809  . ondansetron (ZOFRAN) injection 4 mg  4 mg Intravenous Q6H PRN Eugenie Norrie, NP   4 mg at 08/02/20 2145  .  pantoprazole (PROTONIX) EC tablet 40 mg  40 mg Oral Daily Zannie Cove, MD   40 mg at 08/13/20 2025  . polyethylene glycol (MIRALAX / GLYCOLAX) packet 17 g  17 g Oral Daily PRN Eugenie Norrie, NP      . polyvinyl alcohol (LIQUIFILM TEARS) 1.4 % ophthalmic solution 1 drop  1 drop Both Eyes PRN Charise Killian, MD   1 drop at 08/10/20 1328  . thiamine tablet 100 mg  100 mg Oral Daily Zannie Cove, MD   100 mg at 08/13/20 0827  . traZODone (DESYREL) tablet 25 mg  25 mg Oral QHS PRN Mansy, Jan A, MD   25 mg at 08/13/20 0215     Discharge Medications: Please see discharge summary for a list of discharge medications.  Relevant Imaging Results:  Relevant Lab Results:   Additional Information SS#: [id number]  Margarito Liner, LCSW

## 2020-08-13 NOTE — TOC Progression Note (Addendum)
Transition of Care [facility]) - Progression Note    Patient Details  Name: Chad [name] MRN: [id number] Date of Birth: 1950/06/25  [facility]) CM/SW Contact  Margarito Liner, LCSW Phone Number: 08/13/2020, 9:15 AM  Clinical Narrative: Uploaded requested documentation into [facility] [employer] for PASARR review. No other bed offers this morning. Sent therapy notes on hub to facilities still pending to try and trigger more responses.    10:03 am: Only pending local SNF is [facility]. All others have declined. Left message for admissions coordinator asking her to review.  12:26 pm: [facility] can offer a bed but patient is not vaccinated so he wants to know if he'll be confined to his room for 10-14 days. If so, he will likely decide to go home with home health. Patient wants some time to think about it. Will follow up later today. Uploaded requested documents into [facility] [employer].  1:36 pm: PASARR obtained: [id number]. Expires 09/12/20.  2:20 pm: Patient would have to stay in his room at [facility] due to no COVID vaccines. Patient is aware and prefers to return home with home health. No agency preference. [facility] (previously [facility]) is reviewing referral.  3:18 pm: [facility] can accept patient for PT, OT, aide.  Expected Discharge Plan: Skilled Nursing Facility Barriers to Discharge: No SNF bed  Expected Discharge Plan and Services Expected Discharge Plan: Skilled Nursing Facility In-house Referral: Clinical Social Work   Post Acute Care Choice: Skilled Nursing Facility ([facility] , walker) Living arrangements for the past 2 months: Apartment Expected Discharge Date: 08/09/20               DME Arranged: N/A DME Agency: NA       HH Arranged: NA           Social Determinants of Health (SDOH) Interventions    Readmission Risk Interventions No flowsheet data found.

## 2020-08-13 NOTE — Progress Notes (Signed)
PROGRESS NOTE    Chad [name]  ZOX:[id number] DOB: Sep 18, 1950 DOA: 08/01/2020 PCP: Rayetta Humphrey, MD   Assessment & Plan:   Active Problems:   RUQ pain   Acute GI bleeding   Blood in stool   Duodenal ulcer without hemorrhage or perforation  Hemorrhagic shock: likely secondary to alcohol abuse & hx of gastric & duodenal ulcers. S/p 2 units of pRBCs transfused. Resolved  Gastric & duodenal ulcers: likely secondary to alcohol abuse. S/p EGD which showed nonbleeding gastric & duodenal ulcers on 08/02/20. 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 Continue w/ supportive care   Alcohol withdrawal: resolved. Drinks 1-2 pints of liquor daily. Continue on folate, thiamine. Alcohol cessation counseling   Hyponatremia: resolved  Hypomagnesemia: WNL   Thrombocytopenia: resolved   Macrocytic anemia: H&H are labile. Secondary to alcohol abuse. S/p 2 units transfused      DVT prophylaxis: SCDs Code Status: full  Family Communication:  Disposition Plan: likely d/c to SNF   Level of care: Med-Surg   Status is: Inpatient  Remains inpatient appropriate because:Unsafe d/c plan, IV treatments appropriate due to intensity of illness or inability to take PO and Inpatient level of care appropriate due to severity of illness, pt has 2 other bed offers but pt does not want to go to [place] or [place]. Pt is unable to walk so it is unsafe for pt to be d/c home alone.    Dispo: The patient is from: Home              Anticipated d/c is to: SNF              Patient currently is medically stable to d/c.   Difficult to place patient: yes        Consultants:   GI    Procedures: EGD   Antimicrobials:    Subjective: Pt c/o fatigue   Objective: Vitals:   08/12/20 1644 08/12/20 2036 08/12/20 2326 08/13/20 0428  BP: 110/68 103/67 114/73 102/69  Pulse: 95 80 83 75  Resp: 16 20 18 20   Temp: 98.2  F (36.8 C) 98.2 F (36.8 C) 97.9 F (36.6 C) 97.7 F (36.5 C)  TempSrc: Oral Oral    SpO2: 97% 96% 95% 96%  Weight:      Height:        Intake/Output Summary (Last 24 hours) at 08/13/2020 0730 Last data filed at 08/12/2020 1317 Gross per 24 hour  Intake 480 ml  Output 675 ml  Net -195 ml   Filed Weights   08/09/20 0453 08/10/20 0500 08/12/20 0500  Weight: 70 kg 70.2 kg 68.4 kg    Examination:  General exam: Appears comfortable  Respiratory system: clear breath sounds b/l Cardiovascular system: S1/S2+. No rubs or gallops   Gastrointestinal system: Abd is soft, NT, ND & normal bowel sounds  Central nervous system: Alert and oriented. Moves all extremities  Psychiatry: Judgement and insight appear normal. Flat mood and affect    Data Reviewed: I have personally reviewed following labs and imaging studies  CBC: Recent Labs  Lab 08/09/20 0524 08/10/20 0452 08/11/20 0537 08/12/20 0407 08/13/20 0341  WBC 4.4 4.7 4.8 5.4 5.3  HGB 11.3* 10.6* 11.5* 10.4* 10.8*  HCT 35.7* 32.0* 36.4* 32.5* 33.5*  MCV 100.3* 97.9 100.0 99.1 98.2  PLT 180 201 207 237 255   Basic Metabolic Panel: Recent Labs  Lab 08/09/20 0524 08/10/20 0452 08/11/20  2585 08/12/20 0407 08/13/20 0341  NA 137 137 137 137 137  K 4.1 4.0 4.1 4.1 3.9  CL 104 105 103 105 104  CO2 24 24 24 24 25   GLUCOSE 90 90 100* 101* 106*  BUN 12 10 9 11 12   CREATININE 0.77 0.76 0.73 0.81 0.79  CALCIUM 8.5* 8.3* 8.8* 8.6* 8.6*  MG  --   --   --  2.0 1.8   GFR: Estimated Creatinine Clearance: 86.7 mL/min (by C-G formula based on SCr of 0.79 mg/dL). Liver Function Tests: Recent Labs  Lab 08/09/20 0524 08/10/20 0452 08/11/20 0537 08/12/20 0407 08/13/20 0341  AST 41 33 30 28 27   ALT 26 21 20 18 19   ALKPHOS 139* 123 125 116 111  BILITOT 0.7 0.7 0.6 0.4 0.5  PROT 5.3* 5.0* 5.6* 5.3* 5.8*  ALBUMIN 2.4* 2.3* 2.4* 2.3* 2.5*   No results for input(s): LIPASE, AMYLASE in the last 168 hours. No results for  input(s): AMMONIA in the last 168 hours. Coagulation Profile: No results for input(s): INR, PROTIME in the last 168 hours. Cardiac Enzymes: No results for input(s): CKTOTAL, CKMB, CKMBINDEX, TROPONINI in the last 168 hours. BNP (last 3 results) No results for input(s): PROBNP in the last 8760 hours. HbA1C: No results for input(s): HGBA1C in the last 72 hours. CBG: Recent Labs  Lab 08/12/20 1421  GLUCAP 99   Lipid Profile: No results for input(s): CHOL, HDL, LDLCALC, TRIG, CHOLHDL, LDLDIRECT in the last 72 hours. Thyroid Function Tests: No results for input(s): TSH, T4TOTAL, FREET4, T3FREE, THYROIDAB in the last 72 hours. Anemia Panel: No results for input(s): VITAMINB12, FOLATE, FERRITIN, TIBC, IRON, RETICCTPCT in the last 72 hours. Sepsis Labs: No results for input(s): PROCALCITON, LATICACIDVEN in the last 168 hours.  Recent Results (from the past 240 hour(s))  Resp Panel by RT-PCR (Flu A&B, Covid) Nasopharyngeal Swab     Status: None   Collection Time: 08/09/20  2:40 PM   Specimen: Nasopharyngeal Swab; Nasopharyngeal(NP) swabs in vial transport medium  Result Value Ref Range Status   SARS Coronavirus 2 by RT PCR NEGATIVE NEGATIVE Final    Comment: (NOTE) SARS-CoV-2 target nucleic acids are NOT DETECTED.  The SARS-CoV-2 RNA is generally detectable in upper respiratory specimens during the acute phase of infection. The lowest concentration of SARS-CoV-2 viral copies this assay can detect is 138 copies/mL. A negative result does not preclude SARS-Cov-2 infection and should not be used as the sole basis for treatment or other patient management decisions. A negative result may occur with  improper specimen collection/handling, submission of specimen other than nasopharyngeal swab, presence of viral mutation(s) within the areas targeted by this assay, and inadequate number of viral copies(<138 copies/mL). A negative result must be combined with clinical observations, patient  history, and epidemiological information. The expected result is Negative.  Fact Sheet for Patients:   Fact Sheet for Healthcare Providers:   This test is no t yet approved or cleared by the 08/14/20 FDA and  has been authorized for detection and/or diagnosis of SARS-CoV-2 by FDA under an Emergency Use Authorization (EUA). This EUA will remain  in effect (meaning this test can be used) for the duration of the COVID-19 declaration under Section 564(b)(1) of the Act, 21 U.S.C.section 360bbb-3(b)(1), unless the authorization is terminated  or revoked sooner.       Influenza A by PCR NEGATIVE NEGATIVE Final   Influenza B by PCR NEGATIVE NEGATIVE Final    Comment: (NOTE) The  Xpert Xpress SARS-CoV-2/FLU/RSV plus assay is intended as an aid in the diagnosis of influenza from Nasopharyngeal swab specimens and should not be used as a sole basis for treatment. Nasal washings and aspirates are unacceptable for Xpert Xpress SARS-CoV-2/FLU/RSV testing.  Fact Sheet for Patients: BloggerCourse.com  Fact Sheet for Healthcare Providers: SeriousBroker.it  This test is not yet approved or cleared by the [place] FDA and has been authorized for detection and/or diagnosis of SARS-CoV-2 by FDA under an Emergency Use Authorization (EUA). This EUA will remain in effect (meaning this test can be used) for the duration of the COVID-19 declaration under Section 564(b)(1) of the Act, 21 U.S.C. section 360bbb-3(b)(1), unless the authorization is terminated or revoked.  Performed at [facility], [address], [place], [place] [address]          Radiology Studies: CT HEAD WO CONTRAST  Result Date: 08/12/2020 CLINICAL DATA:  New facial droop. Hemorrhagic shock. Rule out stroke. EXAM: CT HEAD WITHOUT CONTRAST TECHNIQUE: Contiguous axial  images were obtained from the base of the skull through the vertex without intravenous contrast. COMPARISON:  08/09/2020 FINDINGS: Brain: There is no evidence of an acute infarct, intracranial hemorrhage, mass, midline shift, or extra-axial fluid collection. There is mild-to-moderate cerebral atrophy. Hypodensities in the cerebral white matter bilaterally are unchanged and nonspecific but compatible with mild chronic small vessel ischemic disease. An expanded partially empty sella is noted. Vascular: No hyperdense vessel. Skull: No fracture or suspicious osseous lesion. Sinuses/Orbits: Minimal left maxillary sinus mucosal thickening. Clear mastoid air cells. Unremarkable orbits. Other: None. IMPRESSION: 1. No evidence of acute intracranial abnormality. 2. Mild chronic small vessel ischemic disease and cerebral atrophy. Electronically Signed   By: Sebastian Ache M.D.   On: 08/12/2020 15:21        Scheduled Meds: . diazepam  2 mg Oral BID  . feeding supplement  237 mL Oral Q24H  . folic acid  1 mg Oral Daily  . multivitamin with minerals  1 tablet Oral Daily  . nicotine  14 mg Transdermal Daily  . pantoprazole  40 mg Oral Daily  . thiamine  100 mg Oral Daily   Continuous Infusions: . sodium chloride       LOS: 12 days    Time spent: 20 mins     Charise Killian, MD [facility] Pager [phone]  If 7PM-7AM, please contact night-coverage 08/13/2020, 7:30 AM

## 2020-08-13 NOTE — Care Management Important Message (Signed)
Important Message  Patient Details  Name: Chad [name] MRN: [id number] Date of Birth: May 02, 1950   Medicare Important Message Given:  Yes     [name] 08/13/2020, 11:11 AM

## 2020-08-13 NOTE — Progress Notes (Signed)
Occupational Therapy Treatment Patient Details Name: Chad [name] MRN: [id number] DOB: [birth year] Today's Date: 08/13/2020    History of present illness 70 year old male with alcohol abuse who presented with right upper lobe quadrant abdominal pain, in the emergency department he has large amount of black tarry stool followed by he became hypotensive, tachycardic and pale   OT comments  Upon entering the room, pt supine in bed with no c/o pain and agreeable to OT intervention. Pt very motivated and asking to ambulate with this therapist. Pt donning B socks while in bed without assistance. Supine >sit with supervision and pt standing with min cuing from EOB with use of RW. Pt ambulating 200' with RW and supervision - CGA as pt begans to fatigue. Pt able to demonstrate side stepping with RW in tight spaces with CGA. Pt declined self care tasks and toileting this session. He returned to bed at end of session. All needs within reach. Pt continues to benefit from OT intervention.     Follow Up Recommendations  SNF    Equipment Recommendations  Other (comment) (defer to next venue of care)       Precautions / Restrictions Precautions Precautions: Fall       Mobility Bed Mobility Overal bed mobility: Needs Assistance Bed Mobility: Supine to Sit;Sit to Supine     Supine to sit: Supervision Sit to supine: Supervision   General bed mobility comments: from flat bed with min cuing for task    Transfers Overall transfer level: Needs assistance Equipment used: Rolling walker (2 wheeled) Transfers: Sit to/from [employer] Sit to Stand: Supervision Stand pivot transfers: Min guard            Balance Overall balance assessment: Needs assistance Sitting-balance support: Bilateral upper extremity supported;Feet supported Sitting balance-Leahy Scale: Good       Standing balance-Leahy Scale: Fair Standing balance comment: reliant on BUE support                            ADL either performed or assessed with clinical judgement   ADL       Grooming: Wash/dry hands;Wash/dry face;Set up;Sitting       Lower Body Bathing: Supervison/ safety Lower Body Bathing Details (indicate cue type and reason): to don B socks                             Vision Baseline Vision/History: Wears glasses Patient Visual Report: No change from baseline            Cognition Arousal/Alertness: Awake/alert Behavior During Therapy: WFL for tasks assessed/performed Overall Cognitive Status: History of cognitive impairments - at baseline                   Orientation Level: Disoriented to;Situation;Time     Following Commands: Follows one step commands consistently Safety/Judgement: Decreased awareness of safety;Decreased awareness of deficits   Problem Solving: Slow processing;Requires verbal cues General Comments: Pt is A and O x3 and pleasant this session. Pt needing min cuing for safety awareness this session.                   Pertinent Vitals/ Pain       Pain Assessment: Faces Faces Pain Scale: Hurts little more Pain Location: itching all over Pain Descriptors / Indicators: Discomfort;Other (Comment) (itching) Pain Intervention(s): Limited activity within patient's tolerance;Monitored during session;Repositioned  Frequency  Min 1X/week        Progress Toward Goals  OT Goals(current goals can now be found in the care plan section)  Progress towards OT goals: Progressing toward goals  Acute Rehab OT Goals Patient Stated Goal: rehab then home OT Goal Formulation: With patient Time For Goal Achievement: 08/19/20 Potential to Achieve Goals: Good  Plan Discharge plan remains appropriate       AM-PAC OT "6 Clicks" Daily Activity     Outcome Measure   Help from another person eating meals?: None Help from another person taking care of personal grooming?: A Lot Help from another person toileting,  which includes using toliet, bedpan, or urinal?: A Lot Help from another person bathing (including washing, rinsing, drying)?: A Lot Help from another person to put on and taking off regular upper body clothing?: A Lot Help from another person to put on and taking off regular lower body clothing?: A Lot 6 Click Score: 14    End of Session Equipment Utilized During Treatment: Rolling walker  OT Visit Diagnosis: Other abnormalities of gait and mobility (R26.89);Muscle weakness (generalized) (M62.81)   Activity Tolerance Patient tolerated treatment well   Patient Left in bed;with call bell/phone within reach;with bed alarm set   Nurse Communication Mobility status        Time: 1430-1455 OT Time Calculation (min): 25 min  Charges: OT General Charges $OT Visit: 1 Visit OT Treatments $Self Care/Home Management : 8-22 mins $Therapeutic Activity: 8-22 mins  Jackquline Denmark, MS, OTR/L , CBIS ascom [phone]  08/13/20, 3:48 PM

## 2020-08-13 NOTE — Clinical Social Work Note (Signed)
RE: Chad [name] Date of Birth: Sep 15, 1950 Date: 08/13/2020   To Whom It May Concern:  Please be advised that the above-named patient will require a short-term nursing home stay - anticipated 30 days or less for rehabilitation and strengthening.  The plan is for return home.

## 2020-08-14 LAB — CBC
HCT: 37.1 % — ABNORMAL LOW (ref 39.0–52.0)
Hemoglobin: 12 g/dL — ABNORMAL LOW (ref 13.0–17.0)
MCH: 31.7 pg (ref 26.0–34.0)
MCHC: 32.3 g/dL (ref 30.0–36.0)
MCV: 98.1 fL (ref 80.0–100.0)
Platelets: 289 10*3/uL (ref 150–400)
RBC: 3.78 MIL/uL — ABNORMAL LOW (ref 4.22–5.81)
RDW: 14.9 % (ref 11.5–15.5)
WBC: 6.3 10*3/uL (ref 4.0–10.5)
nRBC: 0 % (ref 0.0–0.2)

## 2020-08-14 LAB — BASIC METABOLIC PANEL
Anion gap: 9 (ref 5–15)
BUN: 13 mg/dL (ref 8–23)
CO2: 25 mmol/L (ref 22–32)
Calcium: 8.9 mg/dL (ref 8.9–10.3)
Chloride: 103 mmol/L (ref 98–111)
Creatinine, Ser: 0.71 mg/dL (ref 0.61–1.24)
GFR, Estimated: >60 mL/min (ref >60)
Glucose, Bld: 116 mg/dL — ABNORMAL HIGH (ref 70–99)
Potassium: 4.1 mmol/L (ref 3.5–5.1)
Sodium: 137 mmol/L (ref 135–145)

## 2020-08-14 LAB — MAGNESIUM: Magnesium: 1.8 mg/dL (ref 1.7–2.4)

## 2020-08-14 NOTE — Progress Notes (Signed)
Physical Therapy Treatment Patient Details Name: Chad [name] MRN: [id number] DOB: [birth year] Today's Date: 08/14/2020    History of Present Illness 70 year old male with alcohol abuse who presented with right upper lobe quadrant abdominal pain, in the emergency department he has large amount of black tarry stool followed by he became hypotensive, tachycardic and pale    PT Comments    Pt was supine in bed with HOB slightly elevated upon arriving. He agrees to PT session and is cooperative and pleasant throughout. Easily able to exit bed, stand, and ambulate with RW. Performed stairs with BUE support. Pt very eager to DC home with HHPT to follow.At conclusion of session, pt was in recliner with call bell in reach and RN aware of pt's abilities.    Follow Up Recommendations  Supervision/Assistance - 24 hour;SNF (pt unwilling to go to rehab)     Equipment Recommendations  Other (comment) (pt reports having all equipment needs met)    Recommendations for Other Services       Precautions / Restrictions Precautions Precautions: Fall Restrictions Weight Bearing Restrictions: No    Mobility  Bed Mobility Overal bed mobility: Needs Assistance Bed Mobility: Supine to Sit;Sit to Supine     Supine to sit: Supervision Sit to supine: Supervision        Transfers Overall transfer level: Needs assistance Equipment used: Rolling walker (2 wheeled) Transfers: Sit to/from Stand Sit to Stand: Supervision            Ambulation/Gait Ambulation/Gait assistance: Min guard Gait Distance (Feet): 200 Feet Assistive device: Rolling walker (2 wheeled) Gait Pattern/deviations: Step-through pattern Gait velocity: decreased   General Gait Details: pt easily able to am,bulate > 200 ft   Stairs Stairs: Yes Stairs assistance: Min guard Stair Management: Two rails;Step to pattern Number of Stairs: 6        Balance Overall balance assessment: Needs assistance Sitting-balance  support: Bilateral upper extremity supported;Feet supported Sitting balance-Leahy Scale: Good     Standing balance support: During functional activity Standing balance-Leahy Scale: Good Standing balance comment: used RW however lets go several timeas during session without LOB      Cognition Arousal/Alertness: Awake/alert Behavior During Therapy: WFL for tasks assessed/performed Overall Cognitive Status: Within Functional Limits for tasks assessed      General Comments: Pt was supine in bed upon arriving. Greets therapist and is cooperative and pleasant throughout.             Pertinent Vitals/Pain Pain Assessment: No/denies pain Pain Score: 0-No pain     PT Goals (current goals can now be found in the care plan section) Acute Rehab PT Goals Patient Stated Goal: go home Progress towards PT goals: Progressing toward goals    Frequency    Min 2X/week      PT Plan Current plan remains appropriate    Co-evaluation     PT goals addressed during session: Mobility/safety with mobility;Balance;Proper use of DME;Strengthening/ROM        AM-PAC PT "6 Clicks" Mobility   Outcome Measure  Help needed turning from your back to your side while in a flat bed without using bedrails?: A Little Help needed moving from lying on your back to sitting on the side of a flat bed without using bedrails?: A Little Help needed moving to and from a bed to a chair (including a wheelchair)?: A Little Help needed standing up from a chair using your arms (e.g., wheelchair or bedside chair)?: A Little Help needed to walk in hospital  room?: A Little Help needed climbing 3-5 steps with a railing? : A Little 6 Click Score: 18    End of Session Equipment Utilized During Treatment: Gait belt Activity Tolerance: Patient tolerated treatment well Patient left: with call bell/phone within reach;in chair;with chair alarm set Nurse Communication: Mobility status PT Visit Diagnosis: Unsteadiness on  feet (R26.81);Muscle weakness (generalized) (M62.81);Difficulty in walking, not elsewhere classified (R26.2)     Time: 8786-7672 PT Time Calculation (min) (ACUTE ONLY): 12 min  Charges:  $Gait Training: 8-22 mins                     Julaine Fusi PTA 08/14/20, 12:30 PM

## 2020-08-14 NOTE — Progress Notes (Signed)
Scratching all over, c/o of itching; Unable to locate Benadryl cream, note sent to pharmacy to replace tube. Windy Carina, RN 1:28 AM 08/14/2020

## 2020-08-14 NOTE — Progress Notes (Signed)
Chad [name] to be D/C'd Home per MD order.  Discussed prescriptions and follow up appointments with the patient. Prescriptions given to patient, medication list explained in detail. Pt verbalized understanding.  Allergies as of 08/14/2020      Reactions   Morphine And Related Other (See Comments)   Causes "bad disposition"   Ibuprofen Other (See Comments)   Gi upset      Medication List    TAKE these medications   acetaminophen 325 MG tablet Commonly known as: TYLENOL Take 2 tablets (650 mg total) by mouth every 6 (six) hours as needed for mild pain (pain score 1-3 or temp > 100.5).   escitalopram 10 MG tablet Commonly known as: LEXAPRO Take 1 tablet by mouth daily.   feeding supplement Liqd Take 237 mLs by mouth 2 (two) times daily between meals.   folic acid 1 MG tablet Commonly known as: FOLVITE Take 1 tablet (1 mg total) by mouth daily.   hydrOXYzine 25 MG capsule Commonly known as: Vistaril Take 1 capsule (25 mg total) by mouth 3 (three) times daily as needed for anxiety or itching.   nicotine 21 mg/24hr patch Commonly known as: NICODERM CQ - dosed in mg/24 hours One 21mg  patch chest wall daily (okay to substitute generic)   pantoprazole 40 MG tablet Commonly known as: Protonix Take 1 tablet (40 mg total) by mouth 2 (two) times daily.   sertraline 50 MG tablet Commonly known as: ZOLOFT Take 3 tablets (150 mg total) by mouth daily.   thiamine 100 MG tablet Take 1 tablet (100 mg total) by mouth daily.   traZODone 100 MG tablet Commonly known as: DESYREL Take 1 tablet (100 mg total) by mouth at bedtime.       Vitals:   08/14/20 0733 08/14/20 1043  BP: 125/81 128/88  Pulse: 73 83  Resp: 16 18  Temp: 97.8 F (36.6 C) 97.6 F (36.4 C)  SpO2: 94% 100%    Skin clean, dry and intact without evidence of skin break down, no evidence of skin tears noted. IV catheter discontinued intact. Site without signs and symptoms of complications. Dressing and pressure  applied. Pt denies pain at this time. No complaints noted.  An After Visit Summary was printed and given to the patient. Patient escorted via WC, and D/C home via private auto.  08/16/20, RN

## 2020-08-14 NOTE — Progress Notes (Signed)
Pharmacist states that [facility] does not have Benadryl cream, will need substitute: ie hydrocortisone. Windy Carina, RN 7:01 AM 08/14/2020

## 2020-08-14 NOTE — Plan of Care (Signed)
  Problem: Health Behavior/Discharge Planning: Goal: Ability to manage health-related needs will improve Outcome: Progressing   Problem: Activity: Goal: Risk for activity intolerance will decrease Outcome: Progressing   Problem: Nutrition: Goal: Adequate nutrition will be maintained Outcome: Progressing   Problem: Pain Managment: Goal: General experience of comfort will improve Outcome: Progressing   Problem: Skin Integrity: Goal: Risk for impaired skin integrity will decrease Outcome: Progressing   

## 2020-08-14 NOTE — Discharge Summary (Signed)
Full d/c summary was done by this author. Please other note to see full d/c summary. This note was previously charged as well so will not charge today

## 2020-08-14 NOTE — TOC Transition Note (Signed)
Transition of Care [facility]) - CM/SW Discharge Note   Patient Details  Name: Chad [name] MRN: [id number] Date of Birth: [birth year]  [facility]) CM/SW Contact:  Margarito Liner, LCSW Phone Number: 08/14/2020, 11:52 AM   Clinical Narrative: Patient has orders to discharge home today. Notified patient that [facility] can accept him for home health PT, OT, aide. He is agreeable. No further concerns. CSW signing off.     Final next level of care: Home w Home Health Services Barriers to Discharge: Barriers Resolved   Patient Goals and CMS Choice Patient states their goals for this hospitalization and ongoing recovery are:: To discharge to [email or web address] Compare Post Acute Care list provided to:: Patient Choice offered to / list presented to : Patient  Discharge Placement                Patient to be transferred to facility by: Says he has a ride home Name of family member notified: NA Patient and family notified of of transfer: 08/14/20  Discharge Plan and Services In-house Referral: Clinical Social Work   Post Acute Care Choice: Skilled Nursing Facility (cain , walker)          DME Arranged: N/A DME Agency: NA       HH Arranged: PT,OT,Nurse's Aide HH Agency: [facility] Date HH Agency Contacted: 08/14/20   Representative spoke with at [facility] Agency: [place] (left message to notify. Made her aware of discharge yesterday).  Social Determinants of Health (SDOH) Interventions     Readmission Risk Interventions No flowsheet data found.

## 2020-09-25 ENCOUNTER — Emergency Department: Payer: Medicare HMO

## 2020-09-25 ENCOUNTER — Observation Stay
Admission: EM | Admit: 2020-09-25 | Discharge: 2020-10-02 | Disposition: A | Discharge status: Home / Self Care | Payer: Medicare HMO | Attending: Internal Medicine | Admitting: Internal Medicine

## 2020-09-25 ENCOUNTER — Other Ambulatory Visit: Payer: Self-pay

## 2020-09-25 DIAGNOSIS — F17210 Nicotine dependence, cigarettes, uncomplicated: Secondary | ICD-10-CM | POA: Diagnosis not present

## 2020-09-25 DIAGNOSIS — E2740 Unspecified adrenocortical insufficiency: Secondary | ICD-10-CM

## 2020-09-25 DIAGNOSIS — Z79899 Other long term (current) drug therapy: Secondary | ICD-10-CM | POA: Diagnosis not present

## 2020-09-25 DIAGNOSIS — R262 Difficulty in walking, not elsewhere classified: Secondary | ICD-10-CM

## 2020-09-25 DIAGNOSIS — F4323 Adjustment disorder with mixed anxiety and depressed mood: Secondary | ICD-10-CM

## 2020-09-25 DIAGNOSIS — I129 Hypertensive chronic kidney disease with stage 1 through stage 4 chronic kidney disease, or unspecified chronic kidney disease: Secondary | ICD-10-CM | POA: Insufficient documentation

## 2020-09-25 DIAGNOSIS — R296 Repeated falls: Secondary | ICD-10-CM | POA: Diagnosis not present

## 2020-09-25 DIAGNOSIS — Y92009 Unspecified place in unspecified non-institutional (private) residence as the place of occurrence of the external cause: Secondary | ICD-10-CM | POA: Diagnosis not present

## 2020-09-25 DIAGNOSIS — S7002XA Contusion of left hip, initial encounter: Principal | ICD-10-CM | POA: Insufficient documentation

## 2020-09-25 DIAGNOSIS — W19XXXA Unspecified fall, initial encounter: Secondary | ICD-10-CM

## 2020-09-25 DIAGNOSIS — Y900 Blood alcohol level of less than 20 mg/100 ml: Secondary | ICD-10-CM | POA: Diagnosis not present

## 2020-09-25 DIAGNOSIS — S79912A Unspecified injury of left hip, initial encounter: Secondary | ICD-10-CM | POA: Diagnosis present

## 2020-09-25 DIAGNOSIS — W07XXXA Fall from chair, initial encounter: Secondary | ICD-10-CM | POA: Insufficient documentation

## 2020-09-25 DIAGNOSIS — Z20822 Contact with and (suspected) exposure to covid-19: Secondary | ICD-10-CM | POA: Diagnosis not present

## 2020-09-25 DIAGNOSIS — Z23 Encounter for immunization: Secondary | ICD-10-CM | POA: Insufficient documentation

## 2020-09-25 DIAGNOSIS — F32A Depression, unspecified: Secondary | ICD-10-CM | POA: Diagnosis present

## 2020-09-25 DIAGNOSIS — F17200 Nicotine dependence, unspecified, uncomplicated: Secondary | ICD-10-CM | POA: Diagnosis present

## 2020-09-25 DIAGNOSIS — F418 Other specified anxiety disorders: Secondary | ICD-10-CM | POA: Diagnosis present

## 2020-09-25 DIAGNOSIS — M25552 Pain in left hip: Secondary | ICD-10-CM | POA: Diagnosis present

## 2020-09-25 DIAGNOSIS — R7989 Other specified abnormal findings of blood chemistry: Secondary | ICD-10-CM

## 2020-09-25 DIAGNOSIS — N189 Chronic kidney disease, unspecified: Secondary | ICD-10-CM | POA: Diagnosis not present

## 2020-09-25 DIAGNOSIS — F419 Anxiety disorder, unspecified: Secondary | ICD-10-CM | POA: Diagnosis present

## 2020-09-25 DIAGNOSIS — E236 Other disorders of pituitary gland: Secondary | ICD-10-CM

## 2020-09-25 DIAGNOSIS — F1010 Alcohol abuse, uncomplicated: Secondary | ICD-10-CM | POA: Diagnosis present

## 2020-09-25 LAB — COMPREHENSIVE METABOLIC PANEL
ALT: 16 U/L (ref 0–44)
AST: 35 U/L (ref 15–41)
Albumin: 3.8 g/dL (ref 3.5–5.0)
Alkaline Phosphatase: 91 U/L (ref 38–126)
Anion gap: 8 (ref 5–15)
BUN: <5 mg/dL — ABNORMAL LOW (ref 8–23)
CO2: 27 mmol/L (ref 22–32)
Calcium: 9.1 mg/dL (ref 8.9–10.3)
Chloride: 100 mmol/L (ref 98–111)
Creatinine, Ser: 0.73 mg/dL (ref 0.61–1.24)
GFR, Estimated: >60 mL/min (ref >60)
Glucose, Bld: 109 mg/dL — ABNORMAL HIGH (ref 70–99)
Potassium: 4.2 mmol/L (ref 3.5–5.1)
Sodium: 135 mmol/L (ref 135–145)
Total Bilirubin: 0.9 mg/dL (ref 0.3–1.2)
Total Protein: 7 g/dL (ref 6.5–8.1)

## 2020-09-25 LAB — ETHANOL: Alcohol, Ethyl (B): <10 mg/dL (ref <10)

## 2020-09-25 LAB — CBC
HCT: 44.1 % (ref 39.0–52.0)
Hemoglobin: 14.8 g/dL (ref 13.0–17.0)
MCH: 31.3 pg (ref 26.0–34.0)
MCHC: 33.6 g/dL (ref 30.0–36.0)
MCV: 93.2 fL (ref 80.0–100.0)
Platelets: 125 10*3/uL — ABNORMAL LOW (ref 150–400)
RBC: 4.73 MIL/uL (ref 4.22–5.81)
RDW: 14.2 % (ref 11.5–15.5)
WBC: 4.8 10*3/uL (ref 4.0–10.5)
nRBC: 0 % (ref 0.0–0.2)

## 2020-09-25 NOTE — ED Triage Notes (Addendum)
Pt to ER via [facility] from home after a mechanical fall from his chair today. Denies LOC. Pt reports hitting head on floor. Pt complaining of left sided hip pain, pt had hip surgery in march on left side.  Pt reports frequent falls since rehab discharge and being unable to take care of himself. Pt also reports not having a meal in months. Patient has been unable to take any prescribed medications and has been unable to visit a doctor due to lack of transportation.

## 2020-09-25 NOTE — ED Notes (Signed)
Pt given sandwich tray and a soda. Pt has no further needs at this time.
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
## 2020-09-26 DIAGNOSIS — F4323 Adjustment disorder with mixed anxiety and depressed mood: Secondary | ICD-10-CM

## 2020-09-26 LAB — SARS CORONAVIRUS 2 (TAT 6-24 HRS): SARS Coronavirus 2: NEGATIVE

## 2020-09-26 MED ORDER — ADULT MULTIVITAMIN W/MINERALS CH
1.0000 | ORAL_TABLET | Freq: Every day | ORAL | Status: DC
Start: 2020-09-26 — End: 2020-10-02
  Administered 2020-09-27 – 2020-10-02 (×6): 1 via ORAL
  Filled 2020-09-26 (×6): qty 1

## 2020-09-26 MED ORDER — THIAMINE HCL 100 MG PO TABS
100.0000 mg | ORAL_TABLET | Freq: Every day | ORAL | Status: DC
Start: 2020-09-26 — End: 2020-10-01
  Administered 2020-09-28: 100 mg via ORAL
  Filled 2020-09-26 (×4): qty 1

## 2020-09-26 MED ORDER — ACETAMINOPHEN 325 MG PO TABS
650.0000 mg | ORAL_TABLET | Freq: Four times a day (QID) | ORAL | Status: DC | PRN
  Administered 2020-09-26 – 2020-10-01 (×8): 650 mg via ORAL
  Filled 2020-09-26 (×11): qty 2

## 2020-09-26 MED ORDER — TRAZODONE HCL 100 MG PO TABS
100.0000 mg | ORAL_TABLET | Freq: Every day | ORAL | Status: DC
  Administered 2020-09-26 – 2020-10-01 (×6): 100 mg via ORAL
  Filled 2020-09-26 (×6): qty 1

## 2020-09-26 MED ORDER — OXYCODONE HCL 5 MG PO TABS
5.0000 mg | ORAL_TABLET | Freq: Once | ORAL | Status: AC
  Administered 2020-09-26: 5 mg via ORAL
  Filled 2020-09-26: qty 1

## 2020-09-26 MED ORDER — ESCITALOPRAM OXALATE 10 MG PO TABS
10.0000 mg | ORAL_TABLET | Freq: Every day | ORAL | Status: DC
  Administered 2020-09-26 – 2020-10-02 (×7): 10 mg via ORAL
  Filled 2020-09-26 (×8): qty 1

## 2020-09-26 NOTE — Evaluation (Signed)
Physical Therapy Evaluation Patient Details Name: Chad [name] MRN: [id number] DOB: May 25, 1950 Today's Date: 09/26/2020   History of Present Illness  70 y.o. male with a past medical history of anxiety, CKD, gastric reflux, hypertension, past history of alcohol abuse now in remission per patient who presents to the emergency department after a fall.  According to the patient he has been very off balance and weak for many months.  States he went to a rehab facility after his last discharge in April after GI bleed.  Had L hip fx in Feb with ORIF and rehab, fell on L hip this time with c/o significant pain  Clinical Impression  Pt hesitant and pain limited t/o session, though he did show good effort despite all this.  He was able to ambulate a little in the room but was slow, shuffling, weak and ultimately quite limited. He has clearly not been able to care for himself recently and acknowledges that he will need help going forward, interested in transitioning to SNF as he does not have family, neighbors, etc that can provide assistance.  Pt had fall on L hip and acute pain (ORIF just 4 months go) with no structural injury, however hesitant to use it much with poor WBing tolerance.  Pt will require STR to get back to functional mobility.      Follow Up Recommendations SNF    Equipment Recommendations   (TBD)    Recommendations for Other Services       Precautions / Restrictions Precautions Precautions: Fall Restrictions Weight Bearing Restrictions: No      Mobility  Bed Mobility Overal bed mobility: Needs Assistance Bed Mobility: Supine to Sit;Sit to Supine     Supine to sit: Min assist Sit to supine: Min assist   General bed mobility comments: Pt very guarded/pain limited with transition to sitting EOB.  Good effort, but ultimately needing significant assist with in and out of bed    Transfers   Equipment used: Rolling walker (2 wheeled)             General transfer  comment: Pt was able to rise to standing with only light physical assist but due to pain needed a lot of encouragement and close supervision  Ambulation/Gait Ambulation/Gait assistance: Min assist Gait Distance (Feet): 45 Feet Assistive device: Rolling walker (2 wheeled)       General Gait Details: Very slow and guarded gait with mild limp on L, but clearly hesitant to use it a lot and heavy reliance on the walker.  Stairs            Wheelchair Mobility    Modified Rankin (Stroke Patients Only)       Balance Overall balance assessment: Needs assistance Sitting-balance support: Bilateral upper extremity supported Sitting balance-Leahy Scale: Fair     Standing balance support: Bilateral upper extremity supported Standing balance-Leahy Scale: Fair Standing balance comment: highly reliant on the walker, no overt LOBs but generally unsteadiness                             Pertinent Vitals/Pain Pain Assessment: 0-10 Pain Score: 7  Pain Location: low back and L hip    Home Living Family/patient expects to be discharged to:: Skilled nursing facility                      Prior Function Level of Independence: Needs assistance   Gait / Transfers Assistance Needed:  Pt with recent falls, reports he does use his walker but is unable to get out of the home  ADL's / Homemaking Assistance Needed: states he has not eaten in a long time, completely unable to care for himself in the home        Hand Dominance        Extremity/Trunk Assessment   Upper Extremity Assessment Upper Extremity Assessment: Generalized weakness    Lower Extremity Assessment Lower Extremity Assessment: Generalized weakness (pain limited, especially with L hip AB/flexion)       Communication   Communication: No difficulties  Cognition Arousal/Alertness: Awake/alert Behavior During Therapy: Anxious Overall Cognitive Status: Within Functional Limits for tasks assessed                                         General Comments      Exercises     Assessment/Plan    PT Assessment Patient needs continued PT services  PT Problem List Decreased strength;Decreased range of motion;Decreased activity tolerance;Decreased balance;Decreased mobility;Decreased knowledge of use of DME;Decreased safety awareness;Pain       PT Treatment Interventions DME instruction;Gait training;Stair training;Functional mobility training;Therapeutic activities;Therapeutic exercise;Neuromuscular re-education;Balance training;Patient/family education    PT Goals (Current goals can be found in the Care Plan section)  Acute Rehab PT Goals Patient Stated Goal: go to rehab and transition from home to SNF PT Goal Formulation: With patient Time For Goal Achievement: 10/10/20 Potential to Achieve Goals: Good    Frequency Min 2X/week   Barriers to discharge        Co-evaluation               AM-PAC PT "6 Clicks" Mobility  Outcome Measure Help needed turning from your back to your side while in a flat bed without using bedrails?: A Little Help needed moving from lying on your back to sitting on the side of a flat bed without using bedrails?: A Lot Help needed moving to and from a bed to a chair (including a wheelchair)?: A Little Help needed standing up from a chair using your arms (e.g., wheelchair or bedside chair)?: A Little Help needed to walk in hospital room?: A Lot Help needed climbing 3-5 steps with a railing? : A Lot 6 Click Score: 15    End of Session Equipment Utilized During Treatment: Gait belt Activity Tolerance: Patient limited by pain;Patient limited by fatigue Patient left: in bed;with call bell/phone within reach Nurse Communication: Mobility status PT Visit Diagnosis: Muscle weakness (generalized) (M62.81);Difficulty in walking, not elsewhere classified (R26.2);Unsteadiness on feet (R26.81);Pain Pain - Right/Left: Left Pain - part of  body: Hip    Time: 0814-4818 PT Time Calculation (min) (ACUTE ONLY): 24 min   Charges:   PT Evaluation $PT Eval Low Complexity: 1 Low PT Treatments $Gait Training: 8-22 mins        Malachi Pro, DPT 09/26/2020, 2:10 PM

## 2020-09-26 NOTE — ED Notes (Signed)
Pt resting at this time; pt just used the urinal.

## 2020-09-26 NOTE — ED Notes (Signed)
Given lunch

## 2020-09-26 NOTE — ED Notes (Signed)
Breakfast tray given. No other needs found at this moment.  

## 2020-09-26 NOTE — NC FL2 (Addendum)
[place] MEDICAID FL2 LEVEL OF CARE SCREENING TOOL     IDENTIFICATION  Patient Name: Chad [name] Birthdate: Aug 30, 1950 Sex: male Admission Date (Current Location): 09/25/2020  [place] and [place] Number:  Chiropodist and Address:  [facility] [address], [place], [place] [address]      Provider Number: [id number]  Attending Physician Name and Address:  No att. providers found  Relative Name and Phone Number:  Larena Glassman (Niece)   [phone] [facility])    Current Level of Care: Hospital Recommended Level of Care: Skilled Nursing Facility Prior Approval Number:    Date Approved/Denied:   PASRR Number: Pending  Discharge Plan:      Current Diagnoses: Patient Active Problem List   Diagnosis Date Noted  . Adjustment disorder with mixed anxiety and depressed mood 09/26/2020  . Blood in stool   . Duodenal ulcer without hemorrhage or perforation   . RUQ pain 08/01/2020  . Acute GI bleeding 08/01/2020  . Shock (HCC)   . Abdominal pain   . GI bleeding 05/02/2020  . Severe recurrent major depression without psychotic features (HCC) 02/08/2020  . 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 Hypokalemia 02/07/2020  . Hypophosphatemia 02/07/2020  . Lactic acidosis 02/04/2020  . Dehydration 02/04/2020  . Generalized weakness 02/04/2020  . Alcohol abuse with intoxication delirium (HCC)   . Anemia   . Closed hip fracture requiring operative repair with routine healing 01/14/2020  . Calculus of gallbladder with acute cholecystitis without obstruction   . GI bleed 12/31/2019  . GIB (gastrointestinal bleeding) 12/31/2019  . Epigastric pain   . Chest pain   . Cough   . Tobacco abuse   . Alcohol abuse 02/20/2019  . Alcoholic ketoacidosis 01/23/2019  . Alcohol dependence with uncomplicated withdrawal (HCC) 01/23/2019  . 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 Depression with anxiety 01/23/2019  . Acute hypoactive delirium due  to multiple etiologies 10/20/2018  . Aspiration pneumonia (HCC) 10/20/2018  . Aspiration into airway 10/18/2018  . Wernicke's encephalopathy 10/18/2018  . Acute alcoholic liver disease 10/16/2018  . Severe protein-calorie malnutrition (HCC) 10/16/2018  . HTN (hypertension) 05/17/2018  . Alcohol withdrawal (HCC) 05/17/2018  . DVT (deep venous thrombosis) (HCC) 05/17/2018  . Major depressive disorder, recurrent, moderate (HCC) 12/29/2017  . Primary osteoarthritis of both hands 12/29/2017  . Psoriasis 12/29/2017  . 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 Viral warts 08/21/2015  . 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 Epidermal inclusion cyst 11/04/2013  . Hepatitis C antibody test positive 10/31/2013  . 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 History of prescription drug abuse 12/23/2012  . BPH (benign prostatic hyperplasia) 12/15/2011  . Tests ordered 12/15/2011  . Chronic shoulder pain 12/12/2011  . ED (erectile dysfunction) 12/12/2011  . GI bleed due to NSAIDs 12/12/2011  . Lumbar herniated disc 12/12/2011  . Rash 12/12/2011  . BACK PAIN, LUMBAR 11/10/2006    Orientation RESPIRATION BLADDER Height & Weight     Self,Situation,Place,Time  Normal Continent Weight: 145 lb (65.8 kg) Height:  5\' 8"  (172.7 cm)  BEHAVIORAL SYMPTOMS/MOOD NEUROLOGICAL BOWEL NUTRITION STATUS      Continent Diet  AMBULATORY STATUS COMMUNICATION OF NEEDS Skin   Limited Assist Verbally Normal                       Personal Care Assistance Level of Assistance  Bathing,Dressing,Total care,Feeding Bathing Assistance: Independent Feeding assistance: Independent Dressing Assistance: Independent Total Care Assistance: Limited assistance  Functional Limitations Info  Sight,Hearing,Speech Sight Info: Adequate Hearing Info: Adequate Speech Info: Adequate    SPECIAL CARE FACTORS FREQUENCY       PT 5X per wekk    OT 5X per week            Contractures Contractures Info: Not  present    Additional Factors Info       Is not vaccinated for COVID           Current Medications (09/26/2020):  This is the current hospital active medication list Current Facility-Administered Medications  Medication Dose Route Frequency Provider Last Rate Last Admin  . acetaminophen (TYLENOL) tablet 650 mg  650 mg Oral Q6H PRN Delton Prairie, MD   650 mg at 09/26/20 1407  . escitalopram (LEXAPRO) tablet 10 mg  10 mg Oral Daily Clapacs, John T, MD      . multivitamin with minerals tablet 1 tablet  1 tablet Oral Daily Delton Prairie, MD      . thiamine tablet 100 mg  100 mg Oral Daily Delton Prairie, MD      . traZODone (DESYREL) tablet 100 mg  100 mg Oral QHS Clapacs, Jackquline Denmark, MD       Current Outpatient Medications  Medication Sig Dispense Refill  . acetaminophen (TYLENOL) 325 MG tablet Take 2 tablets (650 mg total) by mouth every 6 (six) hours as needed for mild pain (pain score 1-3 or temp > 100.5).    [name] escitalopram (LEXAPRO) 10 MG tablet Take 1 tablet by mouth daily. (Patient not taking: Reported on 09/26/2020)    . feeding supplement, ENSURE ENLIVE, (ENSURE ENLIVE) LIQD Take 237 mLs by mouth 2 (two) times daily between meals. 14220 mL 0  . folic acid (FOLVITE) 1 MG tablet Take 1 tablet (1 mg total) by mouth daily. (Patient not taking: No sig reported)    . HYDROcodone-acetaminophen (NORCO) 7.5-325 MG tablet Take 1 tablet by mouth 3 (three) times daily. (Patient not taking: Reported on 09/26/2020)    . hydrOXYzine (VISTARIL) 25 MG capsule Take 1 capsule (25 mg total) by mouth 3 (three) times daily as needed for anxiety or itching. (Patient not taking: Reported on 09/26/2020) 30 capsule 0  . nicotine (NICODERM CQ - DOSED IN MG/24 HOURS) 21 mg/24hr patch One 21mg  patch chest wall daily (okay to substitute generic) (Patient not taking: No sig reported) 28 patch 0  . pantoprazole (PROTONIX) 40 MG tablet Take 1 tablet (40 mg total) by mouth 2 (two) times daily. (Patient not taking: Reported on  09/26/2020) 60 tablet 0  . sertraline (ZOLOFT) 50 MG tablet Take 3 tablets (150 mg total) by mouth daily. 30 tablet 2  . thiamine 100 MG tablet Take 1 tablet (100 mg total) by mouth daily. (Patient not taking: No sig reported) 30 tablet 0  . traZODone (DESYREL) 100 MG tablet Take 1 tablet (100 mg total) by mouth at bedtime. (Patient not taking: No sig reported) 30 tablet 0     Discharge Medications: Please see discharge summary for a list of discharge medications.  Relevant Imaging Results:  Relevant Lab Results:   Additional Information SS# 11/26/2020  [id number], LCSWA
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
## 2020-09-26 NOTE — ED Provider Notes (Signed)
-----------------------------------------   5:40 AM on 09/26/2020 -----------------------------------------   Blood pressure 96/78, pulse 72, temperature 98.8 F (37.1 C), temperature source Oral, resp. rate 20, height 5\' 8"  (1.727 m), weight 65.8 kg, SpO2 100 %.  The patient is calm and cooperative at this time.  There have been no acute events since the last update.  Awaiting disposition plan from Social Work team.   , MD 09/26/20 [phone]

## 2020-09-26 NOTE — ED Notes (Signed)
Pt given meal tray.

## 2020-09-27 ENCOUNTER — Encounter: Payer: Self-pay | Admitting: Internal Medicine

## 2020-09-27 DIAGNOSIS — R296 Repeated falls: Secondary | ICD-10-CM

## 2020-09-27 DIAGNOSIS — M25552 Pain in left hip: Secondary | ICD-10-CM | POA: Diagnosis present

## 2020-09-27 DIAGNOSIS — F17200 Nicotine dependence, unspecified, uncomplicated: Secondary | ICD-10-CM | POA: Diagnosis present

## 2020-09-27 LAB — MAGNESIUM: Magnesium: 1.7 mg/dL (ref 1.7–2.4)

## 2020-09-27 LAB — PHOSPHORUS: Phosphorus: 4.6 mg/dL (ref 2.5–4.6)

## 2020-09-27 MED ORDER — LORAZEPAM 1 MG PO TABS: 1.0000 mg | ORAL_TABLET | ORAL | Status: DC | PRN

## 2020-09-27 MED ORDER — NICOTINE 21 MG/24HR TD PT24
21.0000 mg | MEDICATED_PATCH | Freq: Every day | TRANSDERMAL | Status: DC
  Administered 2020-09-27 – 2020-10-02 (×6): 21 mg via TRANSDERMAL
  Filled 2020-09-27 (×6): qty 1

## 2020-09-27 MED ORDER — ONDANSETRON HCL 4 MG/2ML IJ SOLN: 4.0000 mg | Freq: Four times a day (QID) | INTRAMUSCULAR | Status: DC | PRN

## 2020-09-27 MED ORDER — THIAMINE HCL 100 MG PO TABS
100.0000 mg | ORAL_TABLET | Freq: Every day | ORAL | Status: DC
  Administered 2020-09-27 – 2020-10-02 (×6): 100 mg via ORAL
  Filled 2020-09-27 (×4): qty 1

## 2020-09-27 MED ORDER — THIAMINE HCL 100 MG/ML IJ SOLN
100.0000 mg | Freq: Every day | INTRAMUSCULAR | Status: DC
  Filled 2020-09-27: qty 2

## 2020-09-27 MED ORDER — LORAZEPAM 2 MG PO TABS
0.0000 mg | ORAL_TABLET | Freq: Four times a day (QID) | ORAL | Status: AC
  Administered 2020-09-27: 1 mg via ORAL
  Filled 2020-09-27: qty 1

## 2020-09-27 MED ORDER — ADULT MULTIVITAMIN W/MINERALS CH: 1.0000 | ORAL_TABLET | Freq: Every day | ORAL | Status: DC

## 2020-09-27 MED ORDER — SODIUM CHLORIDE 0.9% FLUSH
3.0000 mL | Freq: Two times a day (BID) | INTRAVENOUS | Status: DC
  Administered 2020-09-27 – 2020-10-02 (×11): 3 mL via INTRAVENOUS

## 2020-09-27 MED ORDER — ENOXAPARIN SODIUM 40 MG/0.4ML IJ SOSY
40.0000 mg | PREFILLED_SYRINGE | INTRAMUSCULAR | Status: DC
  Administered 2020-09-27 – 2020-10-02 (×6): 40 mg via SUBCUTANEOUS
  Filled 2020-09-27 (×6): qty 0.4

## 2020-09-27 MED ORDER — SODIUM CHLORIDE 0.9% FLUSH: 3.0000 mL | INTRAVENOUS | Status: DC | PRN

## 2020-09-27 MED ORDER — LORAZEPAM 2 MG PO TABS: 0.0000 mg | ORAL_TABLET | Freq: Two times a day (BID) | ORAL | Status: AC

## 2020-09-27 MED ORDER — SODIUM CHLORIDE 0.9 % IV SOLN: 250.0000 mL | INTRAVENOUS | Status: DC | PRN

## 2020-09-27 MED ORDER — PANTOPRAZOLE SODIUM 40 MG PO TBEC
40.0000 mg | DELAYED_RELEASE_TABLET | Freq: Two times a day (BID) | ORAL | Status: DC
  Administered 2020-09-27 – 2020-10-02 (×11): 40 mg via ORAL
  Filled 2020-09-27 (×11): qty 1

## 2020-09-27 MED ORDER — HYDROCODONE-ACETAMINOPHEN 5-325 MG PO TABS
1.0000 | ORAL_TABLET | Freq: Once | ORAL | Status: AC
  Administered 2020-09-27: 1 via ORAL
  Filled 2020-09-27: qty 1

## 2020-09-27 MED ORDER — FOLIC ACID 1 MG PO TABS
1.0000 mg | ORAL_TABLET | Freq: Every day | ORAL | Status: DC
  Administered 2020-09-27 – 2020-10-02 (×6): 1 mg via ORAL
  Filled 2020-09-27 (×6): qty 1

## 2020-09-27 MED ORDER — LORAZEPAM 2 MG/ML IJ SOLN: 1.0000 mg | INTRAMUSCULAR | Status: DC | PRN

## 2020-09-27 MED ORDER — HYDROCODONE-ACETAMINOPHEN 5-325 MG PO TABS
1.0000 | ORAL_TABLET | Freq: Four times a day (QID) | ORAL | Status: DC | PRN
  Administered 2020-09-27 – 2020-10-02 (×18): 1 via ORAL
  Filled 2020-09-27 (×18): qty 1

## 2020-09-27 MED ORDER — ONDANSETRON HCL 4 MG PO TABS: 4.0000 mg | ORAL_TABLET | Freq: Four times a day (QID) | ORAL | Status: DC | PRN

## 2020-09-27 NOTE — ED Notes (Signed)
Pt stated that he had a plan to have a ride come and get him today at noon; we discussed that he can be re-evaluated by the next shift nurse and when the family member or friend gets here they can all talk together and try to formulate a plan for his next steps in his care plan.

## 2020-09-27 NOTE — TOC Progression Note (Signed)
Transition of Care [facility]) - Progression Note    Patient Details  Name: Chad [name] MRN: [id number] Date of Birth: Jun 24, 1950  Transition of Care [facility]) CM/SW Contact  Marina Goodell Phone Number: [phone] 09/27/2020, 4:03 PM  Clinical Narrative:     CSW spoke with Tammy at [facility] who stated they will accept the patient for SNF rehab.  Tammy stated they will begin insurance authorization. CSW updated Michelle Nasuti RN CM and Attending.  PASRR is pending    Barriers to Discharge: ED SNF auth  Expected Discharge Plan and Services                                                 Social Determinants of Health (SDOH) Interventions    Readmission Risk Interventions No flowsheet data found.

## 2020-09-27 NOTE — ED Notes (Signed)
IP RN secured messaged for hand off

## 2020-09-27 NOTE — ED Provider Notes (Signed)
I sent care of this patient approximately 0 700.  Reviewed patient presented after recent fall with worsening ability to care for self and inability ambulate independently.  He does live in an apartment he has to climb 14 stairs to reach.  He is not safe to go home to his apartment.  Initial plan is PT which recommended SNF placement.  Social worker has been working on this but is unable to do so thus far.  Patient is amenable to admission.  Will admit due to worsening weakness in the setting of fall and likely need for placement.   Gilles Chiquito, MD 09/27/20 [phone]

## 2020-09-27 NOTE — Progress Notes (Signed)
Physical Therapy Treatment Patient Details Name: Chad [name] MRN: [id number] DOB: [birth year] Today's Date: 09/27/2020    History of Present Illness 70 y.o. male with a past medical history of anxiety, CKD, gastric reflux, hypertension, past history of alcohol abuse now in remission per patient who presents to the emergency department after a fall.  According to the patient he has been very off balance and weak for many months.  States he went to a rehab facility after his last discharge in April after GI bleed.  Had L hip fx in Feb with ORIF and rehab, fell on L hip this time with c/o significant pain    PT Comments    Patient making progress towards meeting PT goals. Patient reports mild left hip pain and low back pain with activity. Patient continues to require assistance and cues for safety with mobility. Ambulation distance increased this session. Recommend to continue PT to maximize independence and facilitate return to prior level of function. SNF recommended at discharge.     Follow Up Recommendations  SNF     Equipment Recommendations   (to be determined at next level of care)    Recommendations for Other Services       Precautions / Restrictions Precautions Precautions: Fall Restrictions Weight Bearing Restrictions: No    Mobility  Bed Mobility Overal bed mobility: Needs Assistance Bed Mobility: Supine to Sit;Sit to Supine     Supine to sit: Min guard Sit to supine: Min guard   General bed mobility comments: increased time required to complete tasks    Transfers Overall transfer level: Needs assistance Equipment used: Rolling walker (2 wheeled) Transfers: Sit to/from Stand Sit to Stand: Min guard         General transfer comment: verbal cues for technique and hand placement.  Ambulation/Gait Ambulation/Gait assistance: Min guard Gait Distance (Feet): 100 Feet Assistive device: Rolling walker (2 wheeled) Gait Pattern/deviations: Step-through  pattern;Antalgic;Decreased stance time - left Gait velocity: decreased   General Gait Details: verbal cues for technique and placement of rolling walker close to base of support. patient relying heavily on rolling walker for support in standing. no increased pain reported with ambulation. Min guard for Futures trader    Modified Rankin (Stroke Patients Only)       Balance Overall balance assessment: Needs assistance Sitting-balance support: Bilateral upper extremity supported Sitting balance-Leahy Scale: Fair     Standing balance support: Bilateral upper extremity supported Standing balance-Leahy Scale: Fair Standing balance comment: using rolling walker for support in standing                            Cognition Arousal/Alertness: Awake/alert Behavior During Therapy: WFL for tasks assessed/performed                                          Exercises Other Exercises Other Exercises: sit to stand performed x 5 reps for LE strengthening with Min guard and cues for hand palcement for safety    General Comments        Pertinent Vitals/Pain Pain Assessment: Faces Faces Pain Scale: Hurts a little bit Pain Location: left hip and lower back Pain Descriptors / Indicators: Discomfort Pain Intervention(s): Limited activity within patient's tolerance (patient reports he had pain medication prior  to PT session)    Home Living                      Prior Function            PT Goals (current goals can now be found in the care plan section) Acute Rehab PT Goals PT Goal Formulation: With patient Time For Goal Achievement: 10/10/20 Potential to Achieve Goals: Good Progress towards PT goals: Progressing toward goals    Frequency    Min 2X/week      PT Plan Current plan remains appropriate    Co-evaluation              AM-PAC PT "6 Clicks" Mobility   Outcome Measure  Help needed  turning from your back to your side while in a flat bed without using bedrails?: A Little Help needed moving from lying on your back to sitting on the side of a flat bed without using bedrails?: A Lot Help needed moving to and from a bed to a chair (including a wheelchair)?: A Little Help needed standing up from a chair using your arms (e.g., wheelchair or bedside chair)?: A Little Help needed to walk in hospital room?: A Little Help needed climbing 3-5 steps with a railing? : A Lot 6 Click Score: 16    End of Session Equipment Utilized During Treatment: Gait belt Activity Tolerance: Patient tolerated treatment well Patient left: in bed;with call bell/phone within reach;with bed alarm set   PT Visit Diagnosis: Muscle weakness (generalized) (M62.81);Difficulty in walking, not elsewhere classified (R26.2);Unsteadiness on feet (R26.81);Pain Pain - Right/Left: Left Pain - part of body: Hip     Time: 6295-2841 PT Time Calculation (min) (ACUTE ONLY): 18 min  Charges:  $Gait Training: 8-22 mins                     Donna Bernard, PT, MPT    [name] 09/27/2020, 2:20 PM

## 2020-09-27 NOTE — TOC Initial Note (Addendum)
Transition of Care [facility]) - Initial/Assessment Note    Patient Details  Name: Chad [name] MRN: [id number] Date of Birth: 1950/09/07  Transition of Care [facility]) CM/SW Contact:    Marina Goodell Phone Number: [phone] 09/27/2020, 8:52 AM  Clinical Narrative:                  Patient presents to [facility] ED after a fall.  Patient has hx of ETOH and anxiety. Patient stated he is unable to care for himself, and had not eaten for days.  CSW asked patient about the last time he consumed alcohol.  Patient stated he had not consumed alcohol for ten days.  Patient continued to state he did not have access to any resources and no one was willing to help him.  CSW went over the different resources available to the patient and how he could access those resources.  CSW also reminded patient he could contact his niece and ask for assistance if he needed help.  Patient continued to state that "no one wants to help me, and if I were an illegal I bet I would be abel to get all of the things I need."  CSW stated that I would be happy to get him a list of resources.  Patient stated he was so desperate he did not know what to do and "I think I'm going to do something to myself."  CSW asked the patient if he meant he was going to hurt himself, and the patient reiterated he was going to do something to himself.  CSW stated I was going to request a psych consult so that he could speak wit the psychiatrist.  Patient verbalized understanding and stated he has not other needs at this time.    Barriers to Discharge: ED SNF auth   Patient Goals and CMS Choice        Expected Discharge Plan and Services                                                Prior Living Arrangements/Services                       Activities of Daily Living      Permission Sought/Granted                  Emotional Assessment              Admission diagnosis:  Fall/Hip Pain Patient Active  Problem List   Diagnosis Date Noted   Adjustment disorder with mixed anxiety and depressed mood 09/26/2020   Blood in stool    Duodenal ulcer without hemorrhage or perforation    RUQ pain 08/01/2020   Acute GI bleeding 08/01/2020   Shock (HCC)    Abdominal pain    GI bleeding 05/02/2020   Severe recurrent major depression without psychotic features (HCC) 02/08/2020   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   Hypokalemia 02/07/2020   Hypophosphatemia 02/07/2020   Lactic acidosis 02/04/2020   Dehydration 02/04/2020   Generalized weakness 02/04/2020   Alcohol abuse with intoxication delirium (HCC)    Anemia    Closed hip fracture requiring operative repair with routine healing 01/14/2020   Calculus of gallbladder with acute cholecystitis without obstruction    GI bleed 12/31/2019   GIB (gastrointestinal bleeding) 12/31/2019  Epigastric pain    Chest pain    Cough    Tobacco abuse    Alcohol abuse 02/20/2019   Alcoholic ketoacidosis 01/23/2019   Alcohol dependence with uncomplicated withdrawal (HCC) 01/23/2019   Alcohol-induced mood disorder (HCC) 01/23/2019   Depression with anxiety 01/23/2019   Acute hypoactive delirium due to multiple etiologies 10/20/2018   Aspiration pneumonia (HCC) 10/20/2018   Aspiration into airway 10/18/2018   Wernicke's encephalopathy 10/18/2018   Acute alcoholic liver disease 10/16/2018   Severe protein-calorie malnutrition (HCC) 10/16/2018   HTN (hypertension) 05/17/2018   Alcohol withdrawal (HCC) 05/17/2018   DVT (deep venous thrombosis) (HCC) 05/17/2018   Major depressive disorder, recurrent, moderate (HCC) 12/29/2017   Primary osteoarthritis of both hands 12/29/2017   Psoriasis 12/29/2017   Arthropathy 12/24/2017   Viral warts 08/21/2015   Lumbar stenosis with neurogenic claudication 06/07/2015   Epidermal inclusion cyst 11/04/2013   Hepatitis C antibody test positive 10/31/2013   Nonimmune to hepatitis B virus 10/31/2013   History of  prescription drug abuse 12/23/2012   BPH (benign prostatic hyperplasia) 12/15/2011   Tests ordered 12/15/2011   Chronic shoulder pain 12/12/2011   ED (erectile dysfunction) 12/12/2011   GI bleed due to NSAIDs 12/12/2011   Lumbar herniated disc 12/12/2011   Rash 12/12/2011   BACK PAIN, LUMBAR 11/10/2006   PCP:  Rayetta Humphrey, MD Pharmacy:   [facility] [phone] - [place], [place] - [address] [place] [address] Phone: [phone] Fax: [phone]  [facility][id number] [name], [place] - [address] AT [facility] OF [address] [facility] [address] [place] [address] Phone: [phone] Fax: [phone]     Social Determinants of Health (SDOH) Interventions    Readmission Risk Interventions No flowsheet data found.

## 2020-09-27 NOTE — ED Notes (Signed)
Pt presents to nurses station with walker with a steady gait asking to call niece and tell her not to come, niece already here and was sent back to see pt.   Pt educated to use call bell for assistance for safety reasons, pt verbalized understanding.

## 2020-09-27 NOTE — ED Notes (Signed)
Pt presented to nurses station requesting to leave, Charge RN relayed this message to this RN.   This RN went to speak to pt and pt states he feels as if he is being ignored at this time, pt's feelings acknowledged and pt made aware this is not true. Pt states he does not feel he is safe to go home but states he was told he wasn't going to be able to have reasons to be placed in a SNF facility, MD aware and asked to re-eval pt and social work as well.   Pt states he has concerns of balance issues and not being able to cook for himself and states he is scared because he keeps falling due to weakness and balance issues. Pt also reports he has 14 stairs to climb before reaching the inside of his home, MD aware of these issues.
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
## 2020-09-27 NOTE — ED Notes (Signed)
Pt currently sleeping at this time.

## 2020-09-28 DIAGNOSIS — R296 Repeated falls: Secondary | ICD-10-CM | POA: Diagnosis not present

## 2020-09-28 LAB — BASIC METABOLIC PANEL
Anion gap: 8 (ref 5–15)
BUN: 8 mg/dL (ref 8–23)
CO2: 24 mmol/L (ref 22–32)
Calcium: 8.3 mg/dL — ABNORMAL LOW (ref 8.9–10.3)
Chloride: 102 mmol/L (ref 98–111)
Creatinine, Ser: 0.76 mg/dL (ref 0.61–1.24)
GFR, Estimated: >60 mL/min (ref >60)
Glucose, Bld: 93 mg/dL (ref 70–99)
Potassium: 3.4 mmol/L — ABNORMAL LOW (ref 3.5–5.1)
Sodium: 134 mmol/L — ABNORMAL LOW (ref 135–145)

## 2020-09-28 LAB — CBC
HCT: 37.4 % — ABNORMAL LOW (ref 39.0–52.0)
Hemoglobin: 12.4 g/dL — ABNORMAL LOW (ref 13.0–17.0)
MCH: 30.7 pg (ref 26.0–34.0)
MCHC: 33.2 g/dL (ref 30.0–36.0)
MCV: 92.6 fL (ref 80.0–100.0)
Platelets: 131 10*3/uL — ABNORMAL LOW (ref 150–400)
RBC: 4.04 MIL/uL — ABNORMAL LOW (ref 4.22–5.81)
RDW: 13.7 % (ref 11.5–15.5)
WBC: 4.8 10*3/uL (ref 4.0–10.5)
nRBC: 0 % (ref 0.0–0.2)

## 2020-09-28 MED ORDER — COVID-19 MRNA VACC (MODERNA) 100 MCG/0.5ML IM SUSP
0.5000 mL | Freq: Once | INTRAMUSCULAR | Status: AC
  Administered 2020-09-28: 0.5 mL via INTRAMUSCULAR
  Filled 2020-09-28: qty 0.5

## 2020-09-28 MED ORDER — POTASSIUM CHLORIDE CRYS ER 20 MEQ PO TBCR
40.0000 meq | EXTENDED_RELEASE_TABLET | Freq: Once | ORAL | Status: AC
  Administered 2020-09-28: 40 meq via ORAL
  Filled 2020-09-28: qty 2

## 2020-09-28 NOTE — Evaluation (Signed)
Occupational Therapy Evaluation Patient Details Name: Chad [name] MRN: [id number] DOB: 09-20-1950 Today's Date: 09/28/2020    History of Present Illness 70 y.o. male with a past medical history of anxiety, CKD, gastric reflux, hypertension, past history of alcohol abuse now in remission per patient who presents to the emergency department after a fall.  According to the patient he has been very off balance and weak for many months.  States he went to a rehab facility after his last discharge in April after GI bleed.  Had L hip fx in Feb with ORIF and rehab, fell on L hip this time with c/o significant pain   Clinical Impression   Pt seen for OT evaluation this date in setting of acute hospitalization d/t frequent falls. Pt reports that he is typically independent, but endorses generally declining for months with increased weakness and falls. Pt reports he goes without eating at home often d/t generally feeling too weak to stand and prepare food and limited means to secure groceries. Pt presents this date with weakness, decreased balance, and general deconditioning impacting his ability to safely and efficiently perform I/ADLs and fxl mobility. Pt requires SETUP to MIN A for seated UB ADLs, CGA to MIN A for standing LB ADLs, CGA to MIN A for ADL transfers and CGA for fxl mobility with RW for UE support. Noted to only tolerate short ~30 sec to 1 min bouts of standing to complete 1 functional task at a time or one necessary/functional distance at a time. Pt unsafe to return to home environment and living situation as he is requiring increased assistance with nearly all aspects of ADL/IADL performance and is too weak to complete multiple household distances even with UE support on walker. Pt will require rehabilitation stay and continued occupational therapy efforts to improve strength to a level that is safe enough to resume independent lifestyle.     Follow Up Recommendations  SNF    Equipment  Recommendations  3 in 1 bedside commode;Tub/shower seat    Recommendations for Other Services       Precautions / Restrictions Precautions Precautions: Fall Restrictions Weight Bearing Restrictions: No      Mobility Bed Mobility Overal bed mobility: Needs Assistance Bed Mobility: Supine to Sit;Sit to Supine     Supine to sit: Supervision Sit to supine: Supervision   General bed mobility comments: increased time required to complete tasks    Transfers Overall transfer level: Needs assistance Equipment used: Rolling walker (2 wheeled) Transfers: Sit to/from Stand Sit to Stand: Min guard         General transfer comment: cues for safety, increased time and effort    Balance Overall balance assessment: Needs assistance Sitting-balance support: Bilateral upper extremity supported Sitting balance-Leahy Scale: Fair     Standing balance support: Bilateral upper extremity supported Standing balance-Leahy Scale: Fair Standing balance comment: requires UE support, noted to be shaking when he fatigues within ~30 seconds to 1 min standing                           ADL either performed or assessed with clinical judgement   ADL Overall ADL's : Needs assistance/impaired     Grooming: Set up   Upper Body Bathing: Minimal assistance;Sitting   Lower Body Bathing: Minimal assistance;Sit to/from stand   Upper Body Dressing : Set up;Sitting   Lower Body Dressing: Minimal assistance;Sit to/from stand   Toilet Transfer: Min guard;Minimal assistance;Ambulation   Toileting- Clothing  Manipulation and Hygiene: Min guard;Minimal assistance;Sit to/from stand       Functional mobility during ADLs: Min guard;Rolling walker       Vision Baseline Vision/History: Wears glasses Wears Glasses: At all times Patient Visual Report: No change from baseline Additional Comments: pt's glasses broke one of times he fell and suffered an injury. He essentially does not have a  L lens     Perception     Praxis      Pertinent Vitals/Pain Pain Assessment: Faces Faces Pain Scale: Hurts a little bit Pain Location: left hip and lower back Pain Descriptors / Indicators: Discomfort Pain Intervention(s): Limited activity within patient's tolerance;Monitored during session     Hand Dominance Right   Extremity/Trunk Assessment Upper Extremity Assessment Upper Extremity Assessment: Generalized weakness   Lower Extremity Assessment Lower Extremity Assessment: Generalized weakness       Communication Communication Communication: No difficulties   Cognition Arousal/Alertness: Awake/alert Behavior During Therapy: WFL for tasks assessed/performed Overall Cognitive Status: Within Functional Limits for tasks assessed                                     General Comments       Exercises Other Exercises Other Exercises: OT engages pt in bathing/dressing/grooming tasks to not only work on ADL skills, but also to give pt encouragement and improve his ssense of self/self image as he endorses being depressed and this being a major contributor to his general decline with strength and subsequently ADLs/ADL mobility.   Shoulder Instructions      Home Living Family/patient expects to be discharged to:: Skilled nursing facility Living Arrangements: Alone                                      Prior Functioning/Environment Level of Independence: Needs assistance  Gait / Transfers Assistance Needed: Pt with recent falls, alternates using walker or cane, reports falling more with cane use. States he has been unable to get out of his home. ADL's / Homemaking Assistance Needed: states he has not eaten in a long time mostly d/t not enough energy to obtain grocery items or prepare food and frequent falls making standing for long periods difficult. No outside support.            OT Problem List: Decreased strength;Decreased activity  tolerance;Impaired balance (sitting and/or standing)      OT Treatment/Interventions: Self-care/ADL training;DME and/or AE instruction;Therapeutic activities;Balance training;Therapeutic exercise;Patient/family education    OT Goals(Current goals can be found in the care plan section) Acute Rehab OT Goals Patient Stated Goal: go to rehab and transition from home to SNF OT Goal Formulation: With patient Time For Goal Achievement: 10/12/20 ADL Goals Pt Will Perform Upper Body Bathing: with set-up;standing Pt Will Perform Lower Body Bathing: with supervision;sit to/from stand Pt Will Perform Upper Body Dressing: Independently;sitting Pt Will Perform Lower Body Dressing: with supervision;sit to/from stand Pt Will Transfer to Toilet: with supervision;ambulating Pt/caregiver will Perform Home Exercise Program: Increased strength;Both right and left upper extremity;With Supervision  OT Frequency: Min 1X/week   Barriers to D/C:            Co-evaluation              AM-PAC OT "6 Clicks" Daily Activity     Outcome Measure Help from another person eating meals?: A  Little Help from another person taking care of personal grooming?: A Little Help from another person toileting, which includes using toliet, bedpan, or urinal?: A Little Help from another person bathing (including washing, rinsing, drying)?: A Little Help from another person to put on and taking off regular upper body clothing?: A Little Help from another person to put on and taking off regular lower body clothing?: A Little 6 Click Score: 18   End of Session Equipment Utilized During Treatment: Gait belt;Rolling walker Nurse Communication: Mobility status;Other (comment) (attempted x3 calls to notify CNA that pt had bath during OT session and that bed needs to be changed. No answer, OT notified unit secretary and charge RN to pass message along)  Activity Tolerance: Patient tolerated treatment well Patient left: in  chair;with call bell/phone within reach;with chair alarm set  OT Visit Diagnosis: Unsteadiness on feet (R26.81);Muscle weakness (generalized) (M62.81);History of falling (Z91.81);Repeated falls (R29.6)                Time: 5035-4656 OT Time Calculation (min): 48 min Charges:  OT General Charges $OT Visit: 1 Visit OT Evaluation $OT Eval Moderate Complexity: 1 Mod OT Treatments $Self Care/Home Management : 23-37 mins $Therapeutic Activity: 8-22 mins  Rejeana Brock, MS, OTR/L ascom [phone] 09/28/20, 6:55 PM

## 2020-09-28 NOTE — TOC Progression Note (Addendum)
Transition of Care [facility]) - Progression Note    Patient Details  Name: Chad [name] MRN: [id number] Date of Birth: December 31, 1950  Transition of [facility]) CM/SW Contact  Margarito Liner, LCSW Phone Number: 09/28/2020, 8:13 AM  Clinical Narrative: Uploaded requested documents into [place] [facility] for PASARR review.   10:41 am: PASARR under level 2 review. PASARR case worker will have to complete assessment on patient.   Barriers to Discharge: ED SNF auth  Expected Discharge Plan and Services                                                 Social Determinants of Health (SDOH) Interventions    Readmission Risk Interventions No flowsheet data found.

## 2020-09-28 NOTE — Progress Notes (Signed)
PROGRESS NOTE  Chad [name] CWC:[id number] DOB: 05/27/1950 DOA: 09/25/2020 PCP: Rayetta Humphrey, MD  HPI/Recap of past 24 hours:  Chad [name] is a 70 y.o. male with medical history significant for anxiety disorder, nicotine dependence, alcohol abuse, history of peptic ulcer disease, alcohol liver cirrhosis, hypertension, status post left total hip arthroplasty following a mechanical fall who presents to the ER via EMS for evaluation after a mechanical fall at home.  Patient hit his head during the fall but denied having any loss of consciousness.  He complains of pain in his left hip which he rated a 5 x 10 in intensity at its worst and difficulty with ambulation. Patient stated that he had frequent falls since returning home from rehab where he signed himself out AGAINST MEDICAL ADVICE and has been unable to take care of himself.  He has no reliable transportation and has been unable to keep any doctors appointments or pick up any of his prescribed medications.  X-ray of the left hip shows no acute findings. Status post ORIF of the left femur. Twelve-lead EKG reviewed by me shows sinus rhythm with right axis deviation.  He has difficulty with ambulation and states that he lives in a house where he has to walk 14 flights of stairs to get to his apartment.  He is unable to do that at this time due to difficulty with ambulation.  Patient has been evaluated by physical therapy and PT recommended skilled nursing facility. TOC assisting with placement.  09/28/2020: Patient seen and examined at his bedside.  Reports musculoskeletal pain from his fall.  He is concerned about not being able to return to his apartment safely due to having to walk up 14 flight of stairs.    Assessment/Plan: Principal Problem:   Frequent falls Active Problems:   Depression with anxiety   Alcohol abuse   Adjustment disorder with mixed anxiety and depressed mood   Left hip pain   Nicotine dependence  Frequent  falls with ambulatory dysfunction. Seen by PT with recommendation for SNF [facility] assisting with placement. Continue fall precautions Continue PT OT with assistance  Chronic depression/anxiety Continue home Lexapro  Alcohol use disorder CIWA protocol No evidence of acute withdrawal at time of this visit.  Tobacco use disorder Tobacco cessation counseling at bedside Nicotine patch  Left hip pain/musculoskeletal pain from fall Analgesics as needed  Hypokalemia Repleted orally Repeat level in the morning   Code Status: Full code  Family Communication: None at bedside  Disposition Plan: Discharge to SNF when bed is available.   Consultants: TOC  Procedures: None  Antimicrobials: None  DVT prophylaxis: Subcu Lovenox daily  Status is: Observation   Dispo:  Patient From: Home  Planned Disposition: Skilled Nursing Facility when bed is available  Medically stable for discharge: Yes       Objective: Vitals:   09/27/20 2024 09/28/20 0339 09/28/20 0715 09/28/20 1536  BP: 130/79 117/77 133/89 137/84  Pulse: 74 70 72 73  Resp: 18 16 16 16   Temp: 98.1 F (36.7 C) (!) 97.3 F (36.3 C) 97.6 F (36.4 C) 97.9 F (36.6 C)  TempSrc:  Oral Oral Oral  SpO2: 97% 96% 95% 98%  Weight:      Height:        Intake/Output Summary (Last 24 hours) at 09/28/2020 1602 Last data filed at 09/28/2020 1416 Gross per 24 hour  Intake 480 ml  Output 550 ml  Net -70 ml   Filed Weights   09/25/20 1832  09/27/20 1227 09/27/20 1252  Weight: 65.8 kg 65.8 kg 65.8 kg    Exam:  General: 70 y.o. year-old male well developed well nourished in no acute distress.  Alert and oriented x3. Cardiovascular: Regular rate and rhythm with no rubs or gallops.  No thyromegaly or JVD noted.   Respiratory: Clear to auscultation with no wheezes or rales. Good inspiratory effort. Abdomen: Soft nontender nondistended with normal bowel sounds x4 quadrants. Musculoskeletal: No lower extremity edema. 2/4  pulses in all 4 extremities. Skin: No ulcerative lesions noted or rashes, Psychiatry: Mood is appropriate for condition and setting   Data Reviewed: CBC: Recent Labs  Lab 09/25/20 2003 09/28/20 0333  WBC 4.8 4.8  HGB 14.8 12.4*  HCT 44.1 37.4*  MCV 93.2 92.6  PLT 125* 131*   Basic Metabolic Panel: Recent Labs  Lab 09/25/20 2003 09/27/20 1026 09/28/20 0333  NA 135  --  134*  K 4.2  --  3.4*  CL 100  --  102  CO2 27  --  24  GLUCOSE 109*  --  93  BUN <5*  --  8  CREATININE 0.73  --  0.76  CALCIUM 9.1  --  8.3*  MG  --  1.7  --   PHOS  --  4.6  --    GFR: Estimated Creatinine Clearance: 83.4 mL/min (by C-G formula based on SCr of 0.76 mg/dL). Liver Function Tests: Recent Labs  Lab 09/25/20 2003  AST 35  ALT 16  ALKPHOS 91  BILITOT 0.9  PROT 7.0  ALBUMIN 3.8   No results for input(s): LIPASE, AMYLASE in the last 168 hours. No results for input(s): AMMONIA in the last 168 hours. Coagulation Profile: No results for input(s): INR, PROTIME in the last 168 hours. Cardiac Enzymes: No results for input(s): CKTOTAL, CKMB, CKMBINDEX, TROPONINI in the last 168 hours. BNP (last 3 results) No results for input(s): PROBNP in the last 8760 hours. HbA1C: No results for input(s): HGBA1C in the last 72 hours. CBG: No results for input(s): GLUCAP in the last 168 hours. Lipid Profile: No results for input(s): CHOL, HDL, LDLCALC, TRIG, CHOLHDL, LDLDIRECT in the last 72 hours. Thyroid Function Tests: No results for input(s): TSH, T4TOTAL, FREET4, T3FREE, THYROIDAB in the last 72 hours. Anemia Panel: No results for input(s): VITAMINB12, FOLATE, FERRITIN, TIBC, IRON, RETICCTPCT in the last 72 hours. Urine analysis:    Component Value Date/Time   COLORURINE YELLOW (A) 08/01/2020 1343   APPEARANCEUR CLEAR (A) 08/01/2020 1343   LABSPEC 1.003 (L) 08/01/2020 1343   PHURINE 8.0 08/01/2020 1343   GLUCOSEU NEGATIVE 08/01/2020 1343   HGBUR NEGATIVE 08/01/2020 1343   BILIRUBINUR  NEGATIVE 08/01/2020 1343   KETONESUR NEGATIVE 08/01/2020 1343   PROTEINUR NEGATIVE 08/01/2020 1343   NITRITE NEGATIVE 08/01/2020 1343   LEUKOCYTESUR NEGATIVE 08/01/2020 1343   Sepsis Labs: @LABRCNTIP (procalcitonin:4,lacticidven:4)  ) Recent Results (from the past 240 hour(s))  SARS CORONAVIRUS 2 (TAT 6-24 HRS) Nasopharyngeal Nasopharyngeal Swab     Status: None   Collection Time: 09/25/20  8:01 PM   Specimen: Nasopharyngeal Swab  Result Value Ref Range Status   SARS Coronavirus 2 NEGATIVE NEGATIVE Final    Comment: (NOTE) SARS-CoV-2 target nucleic acids are NOT DETECTED.  The SARS-CoV-2 RNA is generally detectable in upper and lower respiratory specimens during the acute phase of infection. Negative results do not preclude SARS-CoV-2 infection, do not rule out co-infections with other pathogens, and should not be used as the sole basis for treatment or other  patient management decisions. Negative results must be combined with clinical observations, patient history, and epidemiological information. The expected result is Negative.  Fact Sheet for Patients: HairSlick.no  Fact Sheet for Healthcare Providers: quierodirigir.com  This test is not yet approved or cleared by the [place] FDA and  has been authorized for detection and/or diagnosis of SARS-CoV-2 by FDA under an Emergency Use Authorization (EUA). This EUA will remain  in effect (meaning this test can be used) for the duration of the COVID-19 declaration under Se ction 564(b)(1) of the Act, 21 U.S.C. section 360bbb-3(b)(1), unless the authorization is terminated or revoked sooner.  Performed at [facility], [address] [place], [place] [address]       Studies: No results found.  Scheduled Meds:  enoxaparin (LOVENOX) injection  40 mg Subcutaneous Q24H   escitalopram  10 mg Oral Daily   folic acid  1 mg Oral Daily   LORazepam  0-4 mg Oral  Q6H   Followed by   Melene Muller ON 09/29/2020] LORazepam  0-4 mg Oral Q12H   multivitamin with minerals  1 tablet Oral Daily   nicotine  21 mg Transdermal Daily   pantoprazole  40 mg Oral BID   sodium chloride flush  3 mL Intravenous Q12H   thiamine  100 mg Oral Daily   Or   thiamine  100 mg Intravenous Daily   thiamine  100 mg Oral Daily   traZODone  100 mg Oral QHS    Continuous Infusions:  sodium chloride       LOS: 0 days     Darlin Drop, MD [facility] Pager [phone]  If 7PM-7AM, please contact night-coverage [email or web address] Password [facility] 09/28/2020, 4:02 PM

## 2020-09-28 NOTE — Plan of Care (Signed)

## 2020-09-28 NOTE — Clinical Social Work Note (Signed)
RE: Chad [name] Date of Birth: [birth year] Date: 09/28/2020   To Whom It May Concern:  Please be advised that the above-named patient will require a short-term nursing home stay - anticipated 30 days or less for rehabilitation and strengthening.  The plan is for return home.

## 2020-09-28 NOTE — Care Management Obs Status (Signed)
MEDICARE OBSERVATION STATUS NOTIFICATION   Patient Details  Name: Chad [name] MRN: [id number] Date of Birth: September 26, 1950   Medicare Observation Status Notification Given:  Yes    Margarito Liner, LCSW 09/28/2020, 3:52 PM

## 2020-09-29 ENCOUNTER — Observation Stay: Payer: Medicare HMO

## 2020-09-29 DIAGNOSIS — E876 Hypokalemia: Secondary | ICD-10-CM

## 2020-09-29 DIAGNOSIS — R262 Difficulty in walking, not elsewhere classified: Secondary | ICD-10-CM

## 2020-09-29 DIAGNOSIS — E236 Other disorders of pituitary gland: Secondary | ICD-10-CM

## 2020-09-29 DIAGNOSIS — F418 Other specified anxiety disorders: Secondary | ICD-10-CM | POA: Diagnosis not present

## 2020-09-29 DIAGNOSIS — M25552 Pain in left hip: Secondary | ICD-10-CM

## 2020-09-29 DIAGNOSIS — R296 Repeated falls: Secondary | ICD-10-CM | POA: Diagnosis not present

## 2020-09-29 DIAGNOSIS — F1010 Alcohol abuse, uncomplicated: Secondary | ICD-10-CM

## 2020-09-29 LAB — BASIC METABOLIC PANEL
Anion gap: 5 (ref 5–15)
BUN: 10 mg/dL (ref 8–23)
CO2: 26 mmol/L (ref 22–32)
Calcium: 8.6 mg/dL — ABNORMAL LOW (ref 8.9–10.3)
Chloride: 105 mmol/L (ref 98–111)
Creatinine, Ser: 0.75 mg/dL (ref 0.61–1.24)
GFR, Estimated: >60 mL/min (ref >60)
Glucose, Bld: 131 mg/dL — ABNORMAL HIGH (ref 70–99)
Potassium: 3.9 mmol/L (ref 3.5–5.1)
Sodium: 136 mmol/L (ref 135–145)

## 2020-09-29 LAB — CBC
HCT: 38 % — ABNORMAL LOW (ref 39.0–52.0)
Hemoglobin: 12.6 g/dL — ABNORMAL LOW (ref 13.0–17.0)
MCH: 30.8 pg (ref 26.0–34.0)
MCHC: 33.2 g/dL (ref 30.0–36.0)
MCV: 92.9 fL (ref 80.0–100.0)
Platelets: 136 10*3/uL — ABNORMAL LOW (ref 150–400)
RBC: 4.09 MIL/uL — ABNORMAL LOW (ref 4.22–5.81)
RDW: 13.5 % (ref 11.5–15.5)
WBC: 5.9 10*3/uL (ref 4.0–10.5)
nRBC: 0 % (ref 0.0–0.2)

## 2020-09-29 MED ORDER — GADOBUTROL 1 MMOL/ML IV SOLN
6.0000 mL | Freq: Once | INTRAVENOUS | Status: AC | PRN
  Administered 2020-09-29: 6 mL via INTRAVENOUS

## 2020-09-29 NOTE — Progress Notes (Signed)
Patient ID: Chad [name], male   DOB: 1950/10/26, 70 y.o.   MRN: [id number] [facility] PROGRESS NOTE  Chad [name] YQI:[id number] DOB: February 05, 1951 DOA: 09/25/2020 PCP: Rayetta Humphrey, MD  HPI/Subjective: Patient not feeling great.  Patient states he has a lot of steps to get into his apartment.  There is no elevator.  He states that he just cannot do it.  He had a fall.  Just feeling weak.  Able to move his extremities.  Objective: Vitals:   09/29/20 0311 09/29/20 0714  BP: 119/70 108/61  Pulse: 81 66  Resp: 16 17  Temp: 98 F (36.7 C) 98.5 F (36.9 C)  SpO2: 95% 96%    Intake/Output Summary (Last 24 hours) at 09/29/2020 1314 Last data filed at 09/29/2020 1000 Gross per 24 hour  Intake 240 ml  Output 950 ml  Net -710 ml   Filed Weights   09/25/20 1832 09/27/20 1227 09/27/20 1252  Weight: 65.8 kg 65.8 kg 65.8 kg    ROS: Review of Systems  Constitutional:  Positive for malaise/fatigue.  Respiratory:  Negative for shortness of breath.   Cardiovascular:  Negative for chest pain.  Gastrointestinal:  Negative for abdominal pain.  Neurological:  Positive for weakness.  Exam: Physical Exam HENT:     Head: Normocephalic.     Mouth/Throat:     Pharynx: No oropharyngeal exudate.  Eyes:     General: Lids are normal.     Conjunctiva/sclera: Conjunctivae normal.     Pupils: Pupils are equal, round, and reactive to light.  Cardiovascular:     Rate and Rhythm: Normal rate and regular rhythm.     Heart sounds: Normal heart sounds, S1 normal and S2 normal.  Pulmonary:     Breath sounds: Normal breath sounds. No decreased breath sounds, wheezing, rhonchi or rales.  Abdominal:     Palpations: Abdomen is soft.     Tenderness: There is no abdominal tenderness.  Musculoskeletal:     Right ankle: No swelling.     Left ankle: No swelling.  Skin:    General: Skin is warm.     Findings: No rash.  Neurological:     Mental Status: He is alert and oriented to person, place,  and time.     Comments: Able to straight leg raise bilaterally.     Data Reviewed: Basic Metabolic Panel: Recent Labs  Lab 09/25/20 2003 09/27/20 1026 09/28/20 0333 09/29/20 0417  NA 135  --  134* 136  K 4.2  --  3.4* 3.9  CL 100  --  102 105  CO2 27  --  24 26  GLUCOSE 109*  --  93 131*  BUN <5*  --  8 10  CREATININE 0.73  --  0.76 0.75  CALCIUM 9.1  --  8.3* 8.6*  MG  --  1.7  --   --   PHOS  --  4.6  --   --    Liver Function Tests: Recent Labs  Lab 09/25/20 2003  AST 35  ALT 16  ALKPHOS 91  BILITOT 0.9  PROT 7.0  ALBUMIN 3.8   CBC: Recent Labs  Lab 09/25/20 2003 09/28/20 0333 09/29/20 0417  WBC 4.8 4.8 5.9  HGB 14.8 12.4* 12.6*  HCT 44.1 37.4* 38.0*  MCV 93.2 92.6 92.9  PLT 125* 131* 136*     Recent Results (from the past 240 hour(s))  SARS CORONAVIRUS 2 (TAT 6-24 HRS) Nasopharyngeal Nasopharyngeal Swab     Status: None  Collection Time: 09/25/20  8:01 PM   Specimen: Nasopharyngeal Swab  Result Value Ref Range Status   SARS Coronavirus 2 NEGATIVE NEGATIVE Final    Comment: (NOTE) SARS-CoV-2 target nucleic acids are NOT DETECTED.  The SARS-CoV-2 RNA is generally detectable in upper and lower respiratory specimens during the acute phase of infection. Negative results do not preclude SARS-CoV-2 infection, do not rule out co-infections with other pathogens, and should not be used as the sole basis for treatment or other patient management decisions. Negative results must be combined with clinical observations, patient history, and epidemiological information. The expected result is Negative.  Fact Sheet for Patients: HairSlick.no  Fact Sheet for Healthcare Providers: quierodirigir.com  This test is not yet approved or cleared by the [place] FDA and  has been authorized for detection and/or diagnosis of SARS-CoV-2 by FDA under an Emergency Use Authorization (EUA). This EUA will remain   in effect (meaning this test can be used) for the duration of the COVID-19 declaration under Se ction 564(b)(1) of the Act, 21 U.S.C. section 360bbb-3(b)(1), unless the authorization is terminated or revoked sooner.  Performed at [facility], [address] [place], [place] [address]      Studies: MR Melven Sartorius WO CONTRAST  Result Date: 09/29/2020 CLINICAL DATA:  Unsteady gait, frequent falls EXAM: MRI HEAD WITHOUT AND WITH CONTRAST TECHNIQUE: Multiplanar, multiecho pulse sequences of the brain and surrounding structures were obtained without and with intravenous contrast. CONTRAST:  35mL GADAVIST GADOBUTROL 1 MMOL/ML IV SOLN COMPARISON:  None. FINDINGS: Brain: There is no cerebellopontine angle mass. Inner ear structures demonstrate an unremarkable MR appearance. There is no abnormal enhancement within the internal auditory canals. No acute infarction or intracranial hemorrhage. There is no mass effect or edema. Prominence of the ventricles and sulci reflects generalized parenchymal volume loss. Patchy small foci of T2 hyperintensity in the supratentorial white matter nonspecific but may reflect minor chronic microvascular ischemic changes. There is no extra-axial fluid collection. Vascular: Major vessel flow voids at the skull base are preserved. Skull and upper cervical spine: Normal marrow signal is preserved. Sinuses/Orbits: Paranasal sinuses are clear. The orbits are unremarkable. Other: The sella is "empty."  Mastoid air cells are clear. IMPRESSION: No evidence of recent infarction, hemorrhage, or mass. No abnormal enhancement. Minor chronic microvascular ischemic changes. Electronically Signed   By: Guadlupe Spanish M.D.   On: 09/29/2020 12:25    Scheduled Meds:  enoxaparin (LOVENOX) injection  40 mg Subcutaneous Q24H   escitalopram  10 mg Oral Daily   folic acid  1 mg Oral Daily   LORazepam  0-4 mg Oral Q12H   multivitamin with minerals  1 tablet Oral Daily   nicotine  21 mg  Transdermal Daily   pantoprazole  40 mg Oral BID   sodium chloride flush  3 mL Intravenous Q12H   thiamine  100 mg Oral Daily   Or   thiamine  100 mg Intravenous Daily   thiamine  100 mg Oral Daily   traZODone  100 mg Oral QHS   Continuous Infusions:  sodium chloride      Assessment/Plan:  Frequent falls with ambulatory dysfunction.  Patient states that he has a balance issue.  Could be secondary to previous alcohol abuse.  I have ordered an MRI of the brain which was negative for stroke or acoustic neuroma.  Continue thiamine.  Continue working with physical therapy.  Physical therapy recommending rehab.  Awaiting insurance authorization for rehab. History of alcohol abuse.  Patient states he has not drank in a while. Depression anxiety on Lexapro and trazodone Left hip pain from fall.  Previous hip fracture.  X-ray unremarkable. Hypokalemia.  Replaced MRI showing empty sella, Will check a.m. cortisol, free T4 (previous TSH normal range), IGF-I and prolactin.        Code Status:     Code Status Orders  (From admission, onward)           Start     Ordered   09/27/20 1024  Full code  Continuous        09/27/20 1026           Code Status History     Date Active Date Inactive Code Status Order ID Comments User Context   08/01/2020 1655 08/14/2020 1847 Full Code [id number]  Eugenie Norrie, NP ED   05/02/2020 0058 05/03/2020 2039 Full Code [id number]  Briscoe Deutscher, MD ED   02/04/2020 1814 02/10/2020 2052 Full Code [id number]  Lurene Shadow, MD ED   01/14/2020 0247 01/19/2020 2357 Full Code [id number]  Mansy, Vernetta Honey, MD ED   01/14/2020 0144 01/14/2020 0247 Full Code [id number]  Nita Sickle, MD ED   12/31/2019 1631 01/06/2020 2346 Full Code [id number]  Gertha Calkin, MD ED   12/09/2019 1729 12/12/2019 1712 Full Code [id number]  Alford Highland, MD ED   02/20/2019 0944 02/20/2019 1734 Full Code [id number]  Chesley Noon, MD ED   01/23/2019 1648 01/25/2019 1957 Full Code  [id number]  Mayo, Allyn Kenner, MD Inpatient   05/17/2018 2327 05/18/2018 1902 Full Code [id number]  Oralia Manis, MD Inpatient   05/17/2018 1950 05/17/2018 2327 Full Code [id number]  Nita Sickle, MD ED   12/24/2017 0803 12/25/2017 1645 Full Code [id number]  Arnaldo Natal, MD ED   06/07/2015 1619 06/08/2015 1754 Full Code [id number]  Shirlean Kelly, MD Inpatient      Disposition Plan: Status is: Observation  Dispo:  Patient From: Home  Planned Disposition: Skilled Nursing Facility  Medically stable for discharge: Yes, awaiting insurance authorization which likely will not occur over the weekend.   Time spent: 27 minutes  Luciel B[employer]

## 2020-09-30 DIAGNOSIS — E2740 Unspecified adrenocortical insufficiency: Secondary | ICD-10-CM

## 2020-09-30 DIAGNOSIS — R7989 Other specified abnormal findings of blood chemistry: Secondary | ICD-10-CM

## 2020-09-30 DIAGNOSIS — R262 Difficulty in walking, not elsewhere classified: Secondary | ICD-10-CM | POA: Diagnosis not present

## 2020-09-30 DIAGNOSIS — R296 Repeated falls: Secondary | ICD-10-CM | POA: Diagnosis not present

## 2020-09-30 DIAGNOSIS — E236 Other disorders of pituitary gland: Secondary | ICD-10-CM | POA: Diagnosis not present

## 2020-09-30 LAB — T4, FREE: Free T4: 0.82 ng/dL (ref 0.61–1.12)

## 2020-09-30 LAB — CORTISOL-AM, BLOOD: Cortisol - AM: 11.1 ug/dL (ref 6.7–22.6)

## 2020-09-30 IMAGING — CR DG CHEST 2V
1 series · 2 of 2 positions shown · non-contrast
Comparison: Chest radiograph and chest CT December 09, 2019

CLINICAL DATA: Chest pain. History of tobacco use. Rectal bleeding

EXAM:
CHEST - 2 VIEW

[Series 1: dg chest 2 view · 0.14mm/px · 2 of 2 slices shown]
[im 1/2]
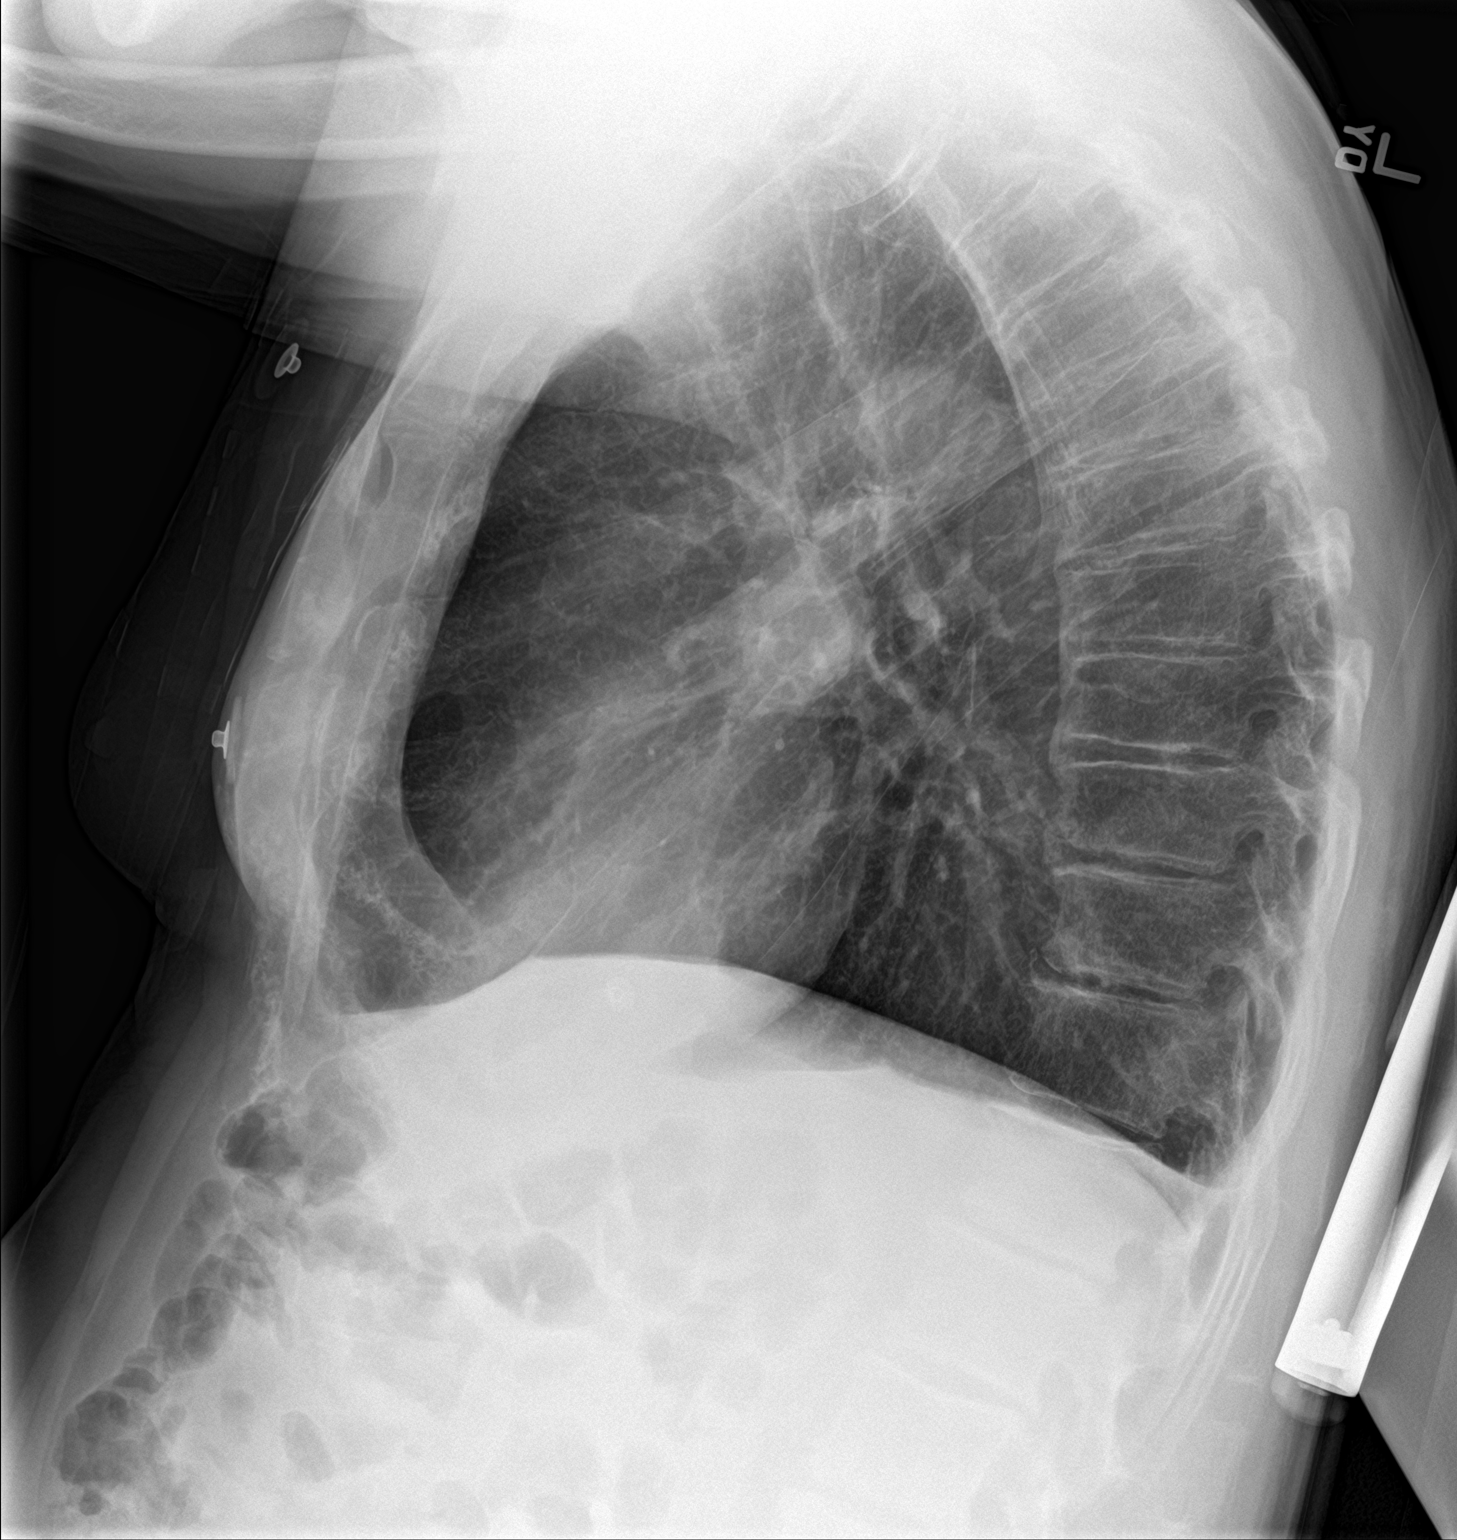
[im 2/2]
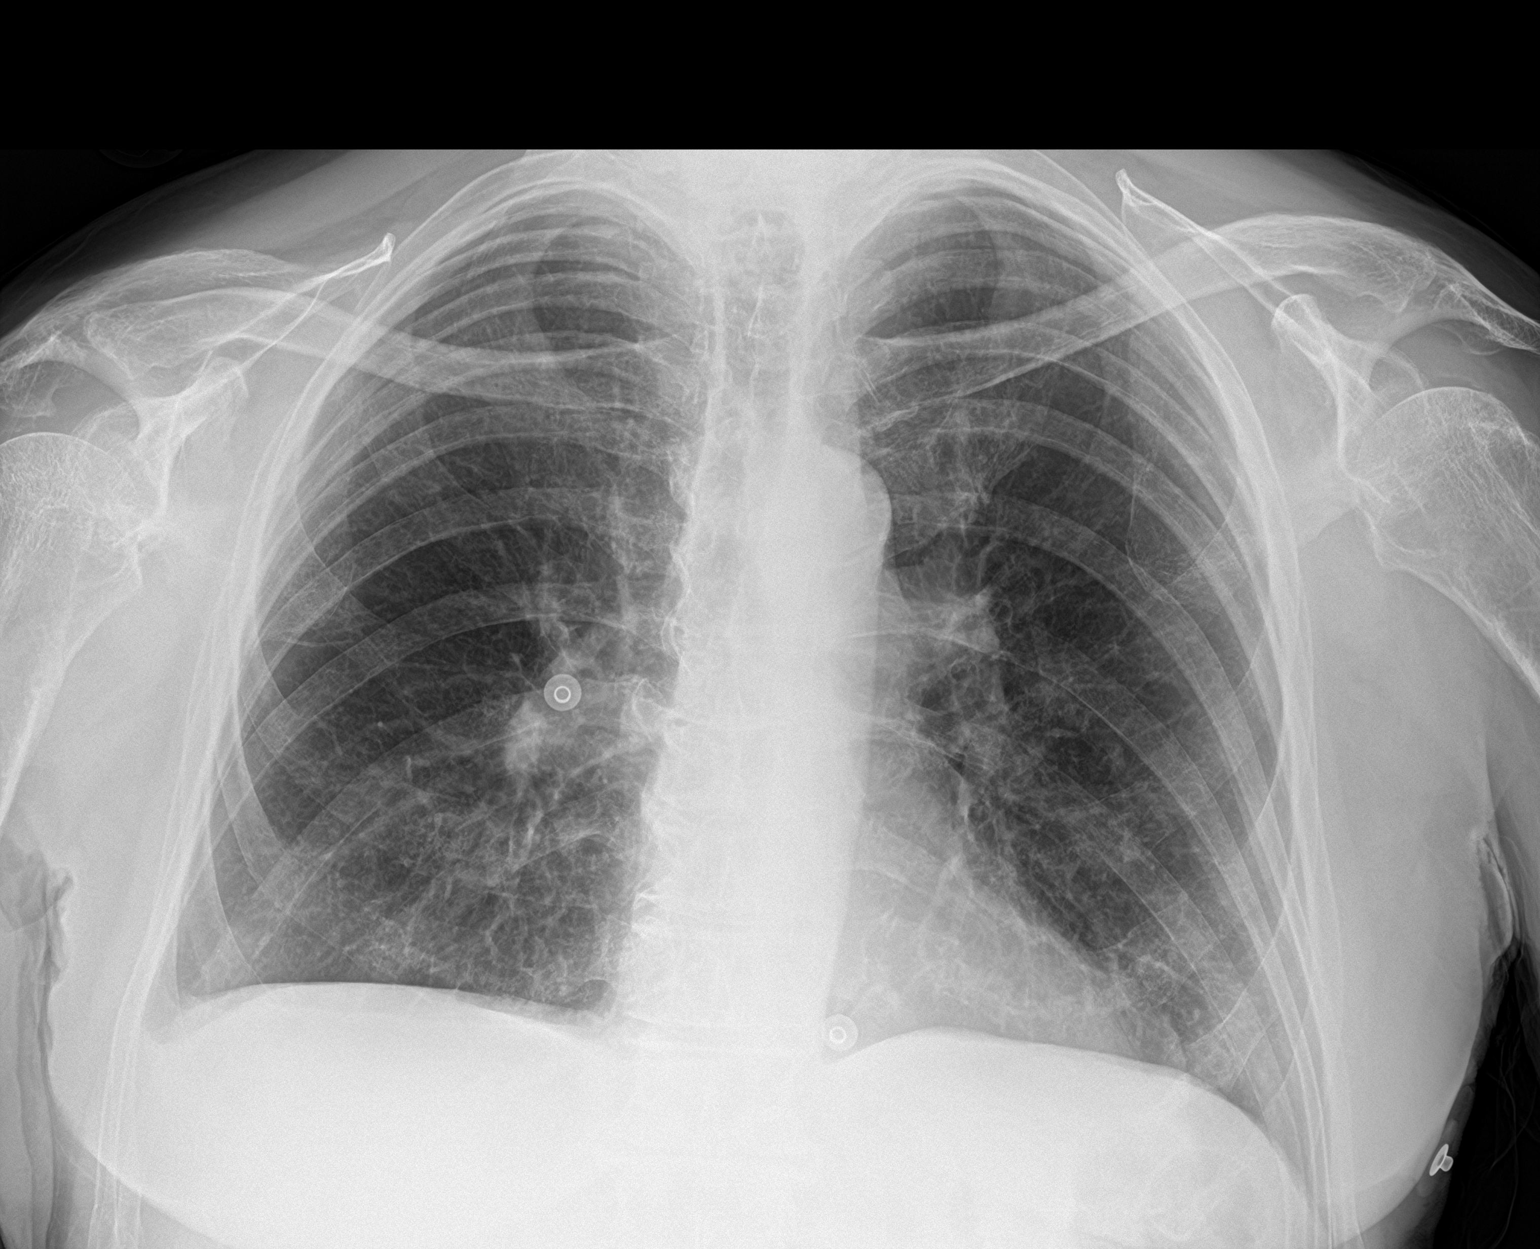

[2 of 2 positions shown; findings below may reference images not displayed]

FINDINGS: Lungs are mildly hyperexpanded. There is a degree of underlying
interstitial thickening without edema or airspace opacity. The heart
size and pulmonary vascularity within normal limits. No adenopathy.
There is degenerative change in the thoracic spine.
IMPRESSION: Suspect a degree of underlying chronic bronchitis. No edema or
airspace opacity. Cardiac silhouette within normal limits.

## 2020-09-30 MED ORDER — COSYNTROPIN 0.25 MG IJ SOLR
0.2500 mg | Freq: Once | INTRAMUSCULAR | Status: AC
  Administered 2020-10-01: 0.25 mg via INTRAVENOUS
  Filled 2020-09-30: qty 0.25

## 2020-09-30 MED ORDER — POLYETHYLENE GLYCOL 3350 17 G PO PACK
17.0000 g | PACK | Freq: Every day | ORAL | Status: DC
  Administered 2020-09-30 – 2020-10-02 (×3): 17 g via ORAL
  Filled 2020-09-30 (×3): qty 1

## 2020-09-30 NOTE — Progress Notes (Signed)
Patient ID: Chad [name], male   DOB: [birth year], 70 y.o.   MRN: [id number] [facility] PROGRESS NOTE  Rafiel Mecca HKV:[id number] DOB: 1951-03-04 DOA: 09/25/2020 PCP: Rayetta Humphrey, MD  HPI/Subjective: Patient still feeling weak and low energy.  Has a little constipation.  Having frequent falls at home.  Has a large stairway he has to climb to get into his apartment.  Admitted with frequent falls.  Objective: Vitals:   09/30/20 1249 09/30/20 1251  BP: 104/62 114/70  Pulse: 80 80  Resp:    Temp:    SpO2:      Intake/Output Summary (Last 24 hours) at 09/30/2020 1253 Last data filed at 09/30/2020 1018 Gross per 24 hour  Intake 240 ml  Output 575 ml  Net -335 ml   Filed Weights   09/25/20 1832 09/27/20 1227 09/27/20 1252  Weight: 65.8 kg 65.8 kg 65.8 kg    ROS: Review of Systems  Respiratory:  Negative for cough and shortness of breath.   Cardiovascular:  Negative for chest pain.  Gastrointestinal:  Negative for abdominal pain, nausea and vomiting.  Exam: Physical Exam HENT:     Head: Normocephalic.     Mouth/Throat:     Pharynx: No oropharyngeal exudate.  Eyes:     General: Lids are normal.     Conjunctiva/sclera: Conjunctivae normal.     Pupils: Pupils are equal, round, and reactive to light.  Cardiovascular:     Rate and Rhythm: Normal rate and regular rhythm.     Heart sounds: Normal heart sounds, S1 normal and S2 normal.  Pulmonary:     Breath sounds: Normal breath sounds. No decreased breath sounds, wheezing, rhonchi or rales.  Abdominal:     Palpations: Abdomen is soft.     Tenderness: There is no abdominal tenderness.  Musculoskeletal:     Right ankle: No swelling.     Left ankle: No swelling.  Skin:    General: Skin is moist.     Findings: No rash.  Neurological:     Mental Status: He is alert and oriented to person, place, and time.     Data Reviewed: Basic Metabolic Panel: Recent Labs  Lab 09/25/20 2003 09/27/20 1026 09/28/20 0333  09/29/20 0417  NA 135  --  134* 136  K 4.2  --  3.4* 3.9  CL 100  --  102 105  CO2 27  --  24 26  GLUCOSE 109*  --  93 131*  BUN <5*  --  8 10  CREATININE 0.73  --  0.76 0.75  CALCIUM 9.1  --  8.3* 8.6*  MG  --  1.7  --   --   PHOS  --  4.6  --   --    Liver Function Tests: Recent Labs  Lab 09/25/20 2003  AST 35  ALT 16  ALKPHOS 91  BILITOT 0.9  PROT 7.0  ALBUMIN 3.8    CBC: Recent Labs  Lab 09/25/20 2003 09/28/20 0333 09/29/20 0417  WBC 4.8 4.8 5.9  HGB 14.8 12.4* 12.6*  HCT 44.1 37.4* 38.0*  MCV 93.2 92.6 92.9  PLT 125* 131* 136*   BNP (last 3 results) Recent Labs    08/01/20 1127  BNP 184.4*      Recent Results (from the past 240 hour(s))  SARS CORONAVIRUS 2 (TAT 6-24 HRS) Nasopharyngeal Nasopharyngeal Swab     Status: None   Collection Time: 09/25/20  8:01 PM   Specimen: Nasopharyngeal Swab  Result Value Ref  Range Status   SARS Coronavirus 2 NEGATIVE NEGATIVE Final    Comment: (NOTE) SARS-CoV-2 target nucleic acids are NOT DETECTED.  The SARS-CoV-2 RNA is generally detectable in upper and lower respiratory specimens during the acute phase of infection. Negative results do not preclude SARS-CoV-2 infection, do not rule out co-infections with other pathogens, and should not be used as the sole basis for treatment or other patient management decisions. Negative results must be combined with clinical observations, patient history, and epidemiological information. The expected result is Negative.  Fact Sheet for Patients: HairSlick.no  Fact Sheet for Healthcare Providers: quierodirigir.com  This test is not yet approved or cleared by the [place] FDA and  has been authorized for detection and/or diagnosis of SARS-CoV-2 by FDA under an Emergency Use Authorization (EUA). This EUA will remain  in effect (meaning this test can be used) for the duration of the COVID-19 declaration under Se  ction 564(b)(1) of the Act, 21 U.S.C. section 360bbb-3(b)(1), unless the authorization is terminated or revoked sooner.  Performed at [facility], [address] [place], [place] [address]      Studies: MR Melven Sartorius WO CONTRAST  Result Date: 09/29/2020 CLINICAL DATA:  Unsteady gait, frequent falls EXAM: MRI HEAD WITHOUT AND WITH CONTRAST TECHNIQUE: Multiplanar, multiecho pulse sequences of the brain and surrounding structures were obtained without and with intravenous contrast. CONTRAST:  34mL GADAVIST GADOBUTROL 1 MMOL/ML IV SOLN COMPARISON:  None. FINDINGS: Brain: There is no cerebellopontine angle mass. Inner ear structures demonstrate an unremarkable MR appearance. There is no abnormal enhancement within the internal auditory canals. No acute infarction or intracranial hemorrhage. There is no mass effect or edema. Prominence of the ventricles and sulci reflects generalized parenchymal volume loss. Patchy small foci of T2 hyperintensity in the supratentorial white matter nonspecific but may reflect minor chronic microvascular ischemic changes. There is no extra-axial fluid collection. Vascular: Major vessel flow voids at the skull base are preserved. Skull and upper cervical spine: Normal marrow signal is preserved. Sinuses/Orbits: Paranasal sinuses are clear. The orbits are unremarkable. Other: The sella is "empty."  Mastoid air cells are clear. IMPRESSION: No evidence of recent infarction, hemorrhage, or mass. No abnormal enhancement. Minor chronic microvascular ischemic changes. Electronically Signed   By: Guadlupe Spanish M.D.   On: 09/29/2020 12:25    Scheduled Meds:  [START ON 10/01/2020] cosyntropin  0.25 mg Intravenous Once   enoxaparin (LOVENOX) injection  40 mg Subcutaneous Q24H   escitalopram  10 mg Oral Daily   folic acid  1 mg Oral Daily   LORazepam  0-4 mg Oral Q12H   multivitamin with minerals  1 tablet Oral Daily   nicotine  21 mg Transdermal Daily   pantoprazole  40 mg  Oral BID   polyethylene glycol  17 g Oral Daily   sodium chloride flush  3 mL Intravenous Q12H   thiamine  100 mg Oral Daily   Or   thiamine  100 mg Intravenous Daily   thiamine  100 mg Oral Daily   traZODone  100 mg Oral QHS   Continuous Infusions:  sodium chloride      Assessment/Plan:  Recurrent falls and ambulatory dysfunction.  MRI of the brain negative for stroke or mass.  Continue working with physical therapy.  Check orthostatics.  Continue thiamine.  Empty sella seen on MRI.  A.m. cortisol on the lower side.  We will do a cosyntropin test tomorrow with ACTH.  Free T4 normal range.  Previous TSH  normal range.  IVF, prolactin FSH and LH pending. Depression anxiety on Lexapro and trazodone History of alcohol abuse.  Continue thiamine Empty sella on MRI.  With a.m. cortisol on the lower side and will do a cosyntropin test. Hypokalemia.  Replaced    Code Status:     Code Status Orders  (From admission, onward)           Start     Ordered   09/27/20 1024  Full code  Continuous        09/27/20 1026           Code Status History     Date Active Date Inactive Code Status Order ID Comments User Context   08/01/2020 1655 08/14/2020 1847 Full Code [id number]  Eugenie Norrie, NP ED   05/02/2020 0058 05/03/2020 2039 Full Code [id number]  Briscoe Deutscher, MD ED   02/04/2020 1814 02/10/2020 2052 Full Code [id number]  Lurene Shadow, MD ED   01/14/2020 0247 01/19/2020 2357 Full Code [id number]  Mansy, Vernetta Honey, MD ED   01/14/2020 0144 01/14/2020 0247 Full Code [id number]  Nita Sickle, MD ED   12/31/2019 1631 01/06/2020 2346 Full Code [id number]  Gertha Calkin, MD ED   12/09/2019 1729 12/12/2019 1712 Full Code [id number]  Alford Highland, MD ED   02/20/2019 0944 02/20/2019 1734 Full Code [id number]  Chesley Noon, MD ED   01/23/2019 1648 01/25/2019 1957 Full Code [id number]  Mayo, Allyn Kenner, MD Inpatient   05/17/2018 2327 05/18/2018 1902 Full Code [id number]  Oralia Manis, MD Inpatient    05/17/2018 1950 05/17/2018 2327 Full Code [id number]  Nita Sickle, MD ED   12/24/2017 0803 12/25/2017 1645 Full Code [id number]  Arnaldo Natal, MD ED   06/07/2015 1619 06/08/2015 1754 Full Code [id number]  Shirlean Kelly, MD Inpatient      Disposition Plan: Status is: Observation  Dispo:  Patient From: Home  Planned Disposition: Skilled Nursing Facility.  Awaiting passar  Medically stable for discharge: Yes, doing a cosyntropin test and ACTH testing tomorrow.  Would like results before disposition.   Time spent: 27 minutes  Kazaria Gaertner [employer]

## 2020-09-30 NOTE — TOC Progression Note (Addendum)
Transition of Care [facility]) - Progression Note    Patient Details  Name: Chad [name] MRN: [id number] Date of Birth: 1950/09/05  Transition of Care [facility]) CM/SW Contact  Chad Cline, LCSW Phone Number: 09/30/2020, 9:49 AM  Clinical Narrative:   Checked status of PASRR, still pending.  Called Chad [name] from [facility] who confirmed they have insurance auth already and should have a bed for patient Monday if PASRR is completed.       Barriers to Discharge: ED SNF auth  Expected Discharge Plan and Services                                                 Social Determinants of Health (SDOH) Interventions    Readmission Risk Interventions No flowsheet data found.

## 2020-10-01 DIAGNOSIS — E2740 Unspecified adrenocortical insufficiency: Secondary | ICD-10-CM | POA: Diagnosis not present

## 2020-10-01 DIAGNOSIS — R262 Difficulty in walking, not elsewhere classified: Secondary | ICD-10-CM | POA: Diagnosis not present

## 2020-10-01 DIAGNOSIS — F418 Other specified anxiety disorders: Secondary | ICD-10-CM | POA: Diagnosis not present

## 2020-10-01 DIAGNOSIS — E236 Other disorders of pituitary gland: Secondary | ICD-10-CM | POA: Diagnosis not present

## 2020-10-01 LAB — SARS CORONAVIRUS 2 (TAT 6-24 HRS): SARS Coronavirus 2: NEGATIVE

## 2020-10-01 LAB — FSH/LH
FSH: 4.2 m[IU]/mL (ref 1.5–12.4)
LH: 4.9 m[IU]/mL (ref 1.7–8.6)

## 2020-10-01 LAB — ACTH STIMULATION, 3 TIME POINTS
Cortisol, 30 Min: 18.9 ug/dL
Cortisol, 60 Min: 21.3 ug/dL
Cortisol, Base: 2.3 ug/dL

## 2020-10-01 IMAGING — US US ABDOMEN LIMITED
1 series · 14 of 25 positions shown · non-contrast
Comparison: CT dated 12/31/2019

CLINICAL DATA: Elevated bilirubin.

EXAM:
ULTRASOUND ABDOMEN LIMITED RIGHT UPPER QUADRANT

[Series 1: us abdomen limited ruq · 14 of 49 slices shown]
[im 1/49]
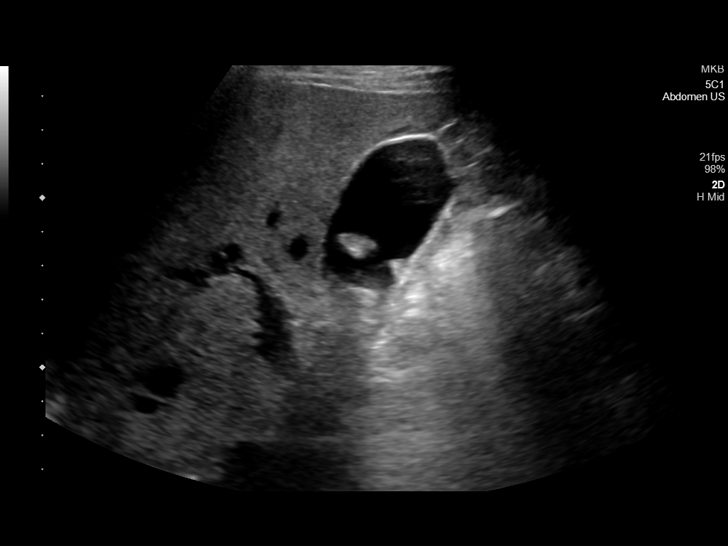
[im 5/49]
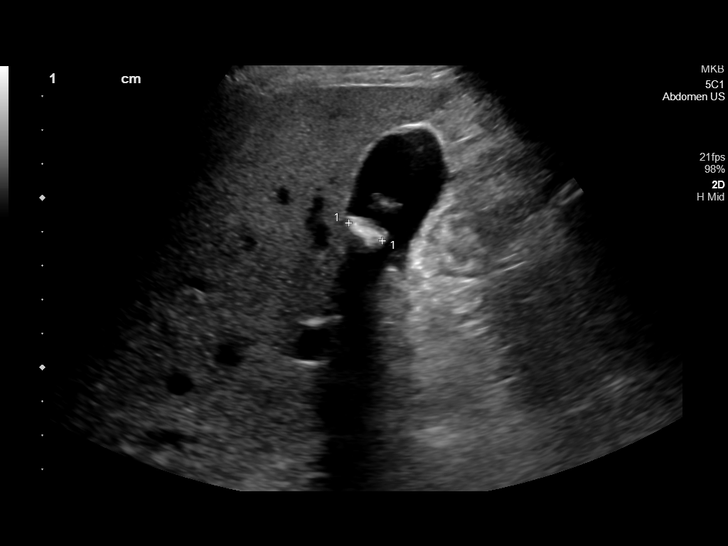
[im 9/49]
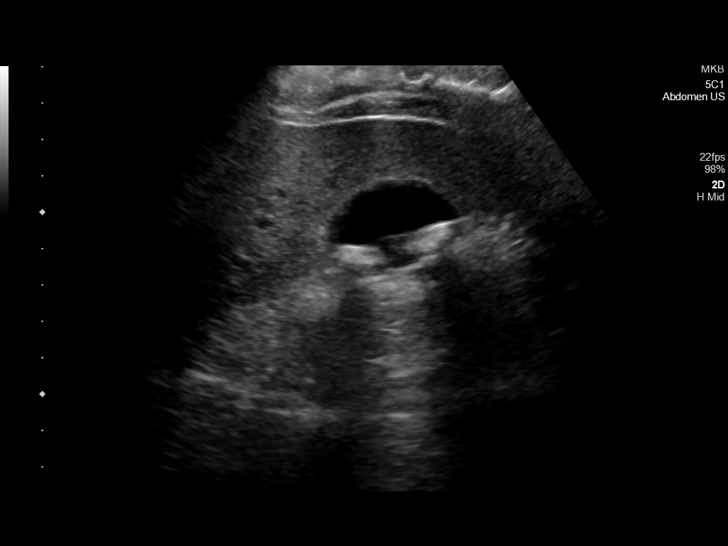
[im 13/49]
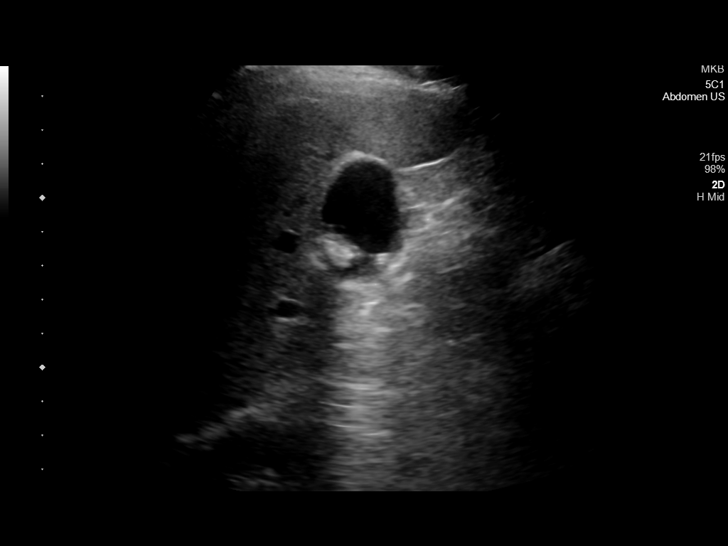
[im 17/49]
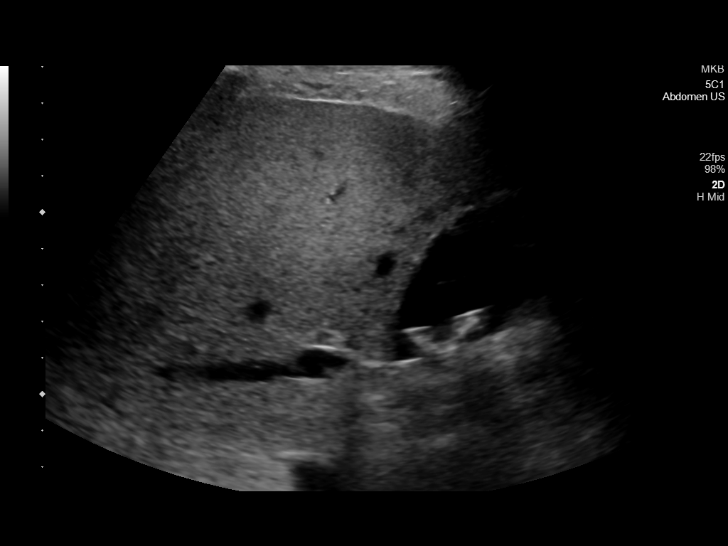
[im 19/49]
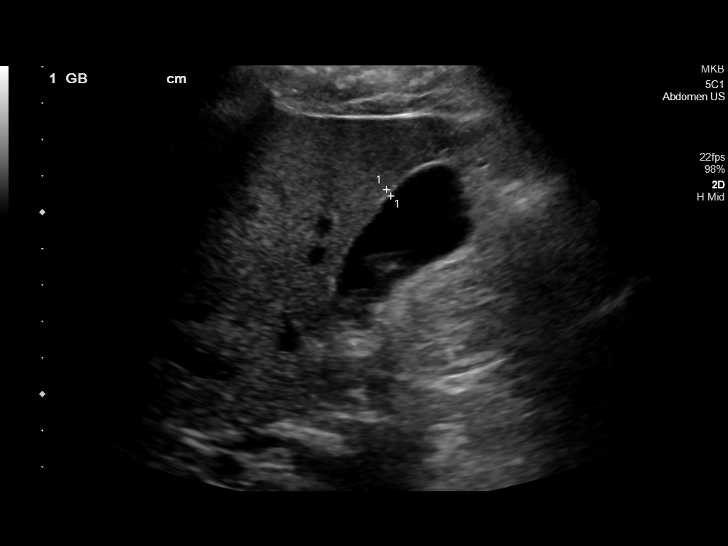
[im 23/49]
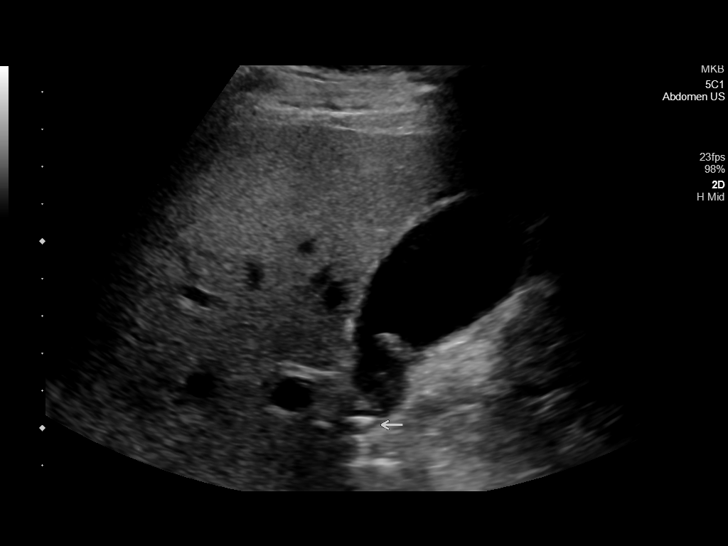
[im 27/49]
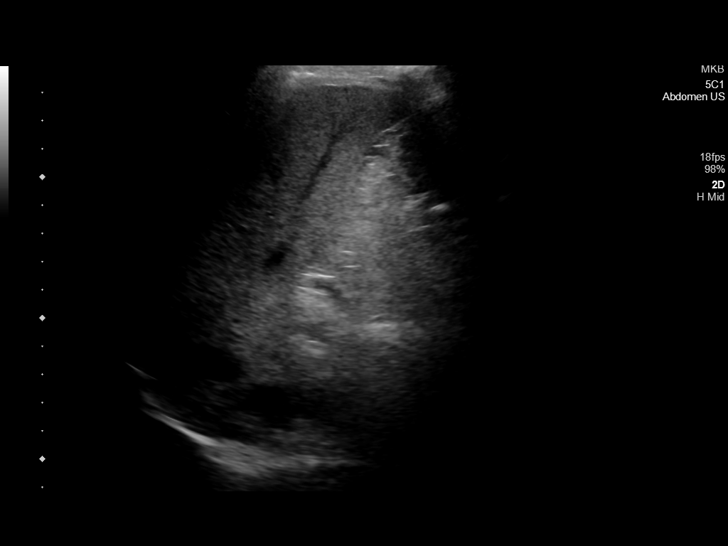
[im 31/49]
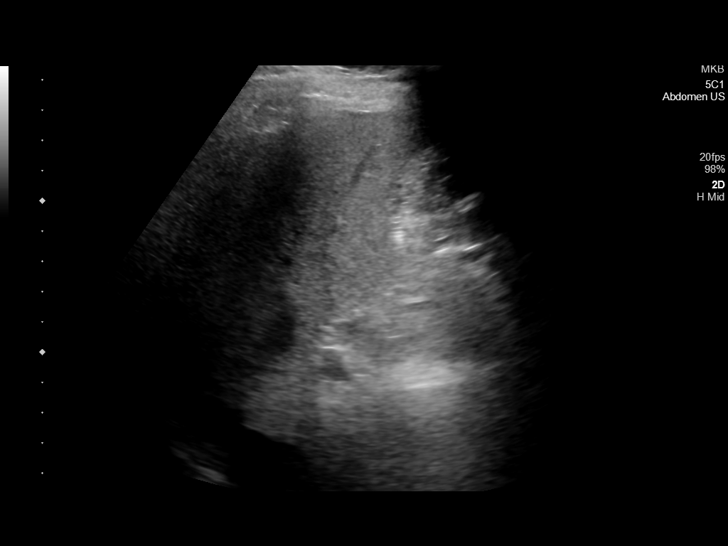
[im 33/49]
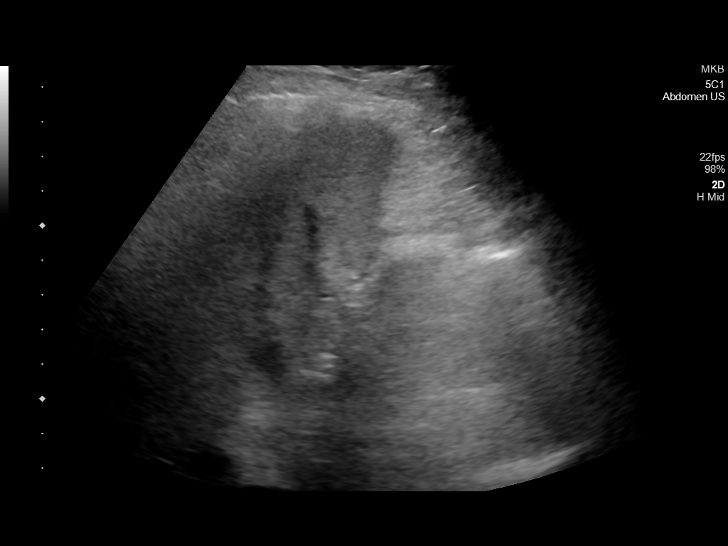
[im 37/49]
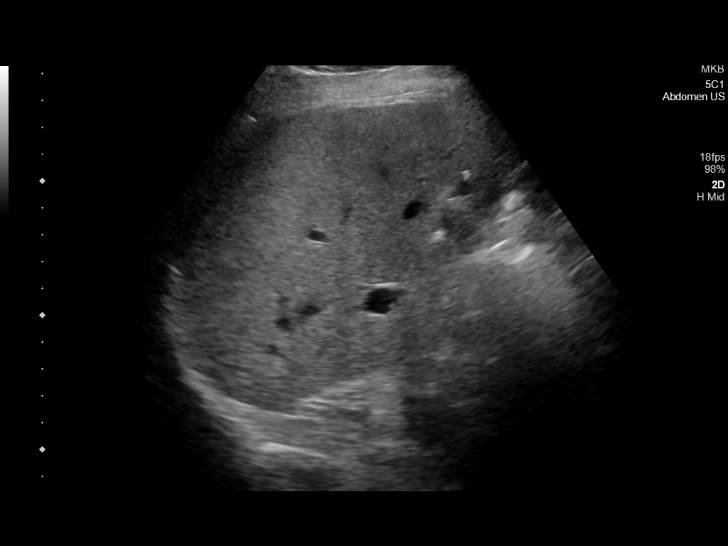
[im 41/49]
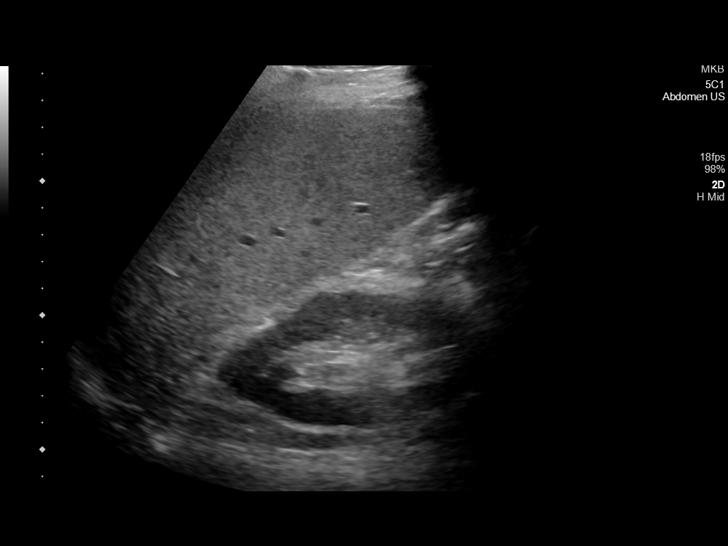
[im 45/49]
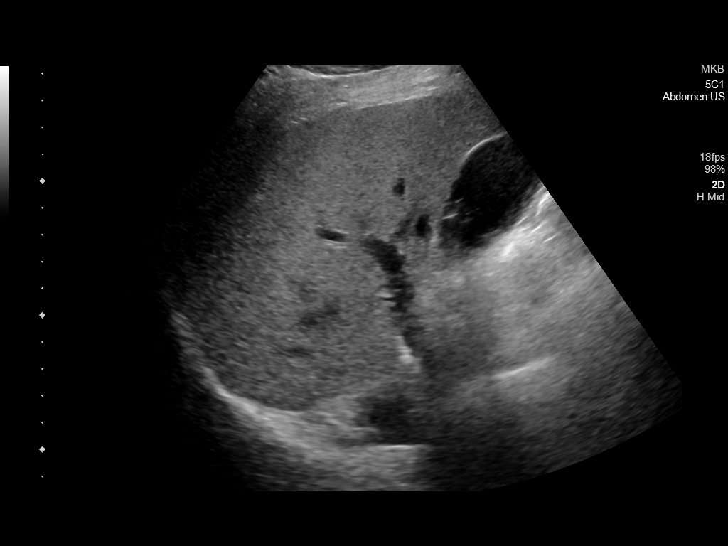
[im 49/49]
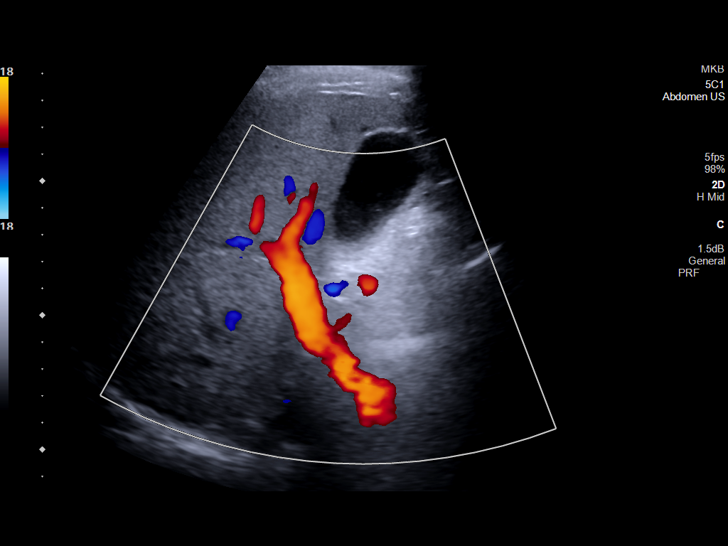

[14 of 25 positions shown; findings below may reference images not displayed]

FINDINGS: Gallbladder:

Multiple gallstones are noted. There is no gallbladder wall
thickening. There is no pericholecystic free fluid. There appears to
be a gallstone wedged at the gallbladder neck. The sonographic
Murphy sign is reported as positive.

Common bile duct:

Diameter: 6 mm

Liver:

Diffuse increased echogenicity with slightly heterogeneous liver.
Appearance typically secondary to fatty infiltration. Fibrosis
secondary consideration. No secondary findings of cirrhosis noted.
No focal hepatic lesion or intrahepatic biliary duct dilatation.
Portal vein is patent on color Doppler imaging with normal direction
of blood flow towards the liver.

Other: None.
IMPRESSION: Sonographic findings are equivocal for calculus acute cholecystitis.
Consider HIDA scan for further evaluation.

## 2020-10-01 MED ORDER — HYDROCORTISONE 10 MG PO TABS
20.0000 mg | ORAL_TABLET | Freq: Every day | ORAL | Status: DC
  Administered 2020-10-02: 20 mg via ORAL
  Filled 2020-10-01 (×2): qty 2

## 2020-10-01 MED ORDER — HYDROCORTISONE 10 MG PO TABS
10.0000 mg | ORAL_TABLET | Freq: Once | ORAL | Status: AC
  Administered 2020-10-01: 10 mg via ORAL
  Filled 2020-10-01 (×2): qty 1

## 2020-10-01 MED ORDER — HYDROCORTISONE 10 MG PO TABS
10.0000 mg | ORAL_TABLET | Freq: Every day | ORAL | Status: DC
  Filled 2020-10-01: qty 1

## 2020-10-01 NOTE — Progress Notes (Signed)
Physical Therapy Treatment Patient Details Name: Chad [name] MRN: [id number] DOB: 13-Aug-1950 Today's Date: 10/01/2020    History of Present Illness 70 y.o. male with a past medical history of anxiety, CKD, gastric reflux, hypertension, past history of alcohol abuse now in remission per patient who presents to the emergency department after a fall.  According to the patient he has been very off balance and weak for many months.  States he went to a rehab facility after his last discharge in April after GI bleed.  Had L hip fx in Feb with ORIF and rehab, fell on L hip this time with c/o significant pain    PT Comments    Pt seen this am, appears in good spirits despite 6/10 Left hip pain with AROM. Pt demonstrated CGA for bed mobility and transfers from various surfaces. Gait training with use of RW 136ft with CGA for safety and cues to stay inside assistive device. No LOB noted with changes in direction and maneuvering around objects. Pt completed seated strengthening exerises for B LE's.  Pt appears aware of his deficits and is concerned to return home alone. Pt wishes to d/c to SNF and transition to ALF if possible.   Follow Up Recommendations  SNF     Equipment Recommendations       Recommendations for Other Services       Precautions / Restrictions Precautions Precautions: Fall Restrictions Weight Bearing Restrictions: No    Mobility  Bed Mobility Overal bed mobility: Needs Assistance Bed Mobility: Supine to Sit;Sit to Supine     Supine to sit: Supervision Sit to supine: Supervision   General bed mobility comments: increased time required to complete tasks    Transfers Overall transfer level: Needs assistance Equipment used: Rolling walker (2 wheeled) Transfers: Sit to/from Stand Sit to Stand: Min guard         General transfer comment: cues for safety, increased time and effort  Ambulation/Gait Ambulation/Gait assistance: Min guard Gait Distance (Feet):  150 Feet Assistive device: Rolling walker (2 wheeled) Gait Pattern/deviations: Step-through pattern;Antalgic;Decreased stance time - left Gait velocity: decreased   General Gait Details: verbal cues for technique and placement of rolling walker close to base of support. patient relying heavily on rolling walker for support in standing. no increased pain reported with ambulation. Min guard for Futures trader    Modified Rankin (Stroke Patients Only)       Balance Overall balance assessment: Needs assistance Sitting-balance support: Bilateral upper extremity supported Sitting balance-Leahy Scale: Fair     Standing balance support: Bilateral upper extremity supported Standing balance-Leahy Scale: Fair               High level balance activites: Direction changes;Turns;Side stepping (Requiring HHA/MinA for balance)              Cognition Arousal/Alertness: Awake/alert Behavior During Therapy: WFL for tasks assessed/performed Overall Cognitive Status: Within Functional Limits for tasks assessed                                        Exercises General Exercises - Lower Extremity Ankle Circles/Pumps: Both;20 reps;Seated Long Arc Quad: AROM;Both;10 reps;Seated (stabilization of Left thigh to complete end range) Hip Flexion/Marching: AROM;Both;10 reps;Seated    General Comments General comments (skin integrity, edema, etc.): Pt stated he is unable  to meet all his needs at home and will need help or ALF placement      Pertinent Vitals/Pain Pain Assessment: 0-10 Pain Score: 6  Pain Location: left hip and lower back Pain Descriptors / Indicators: Discomfort;Grimacing Pain Intervention(s): Monitored during session    Home Living                      Prior Function            PT Goals (current goals can now be found in the care plan section) Acute Rehab PT Goals Patient Stated Goal:  (go to rehab  and transition to ALF) Progress towards PT goals: Progressing toward goals    Frequency    Min 2X/week      PT Plan Current plan remains appropriate    Co-evaluation              AM-PAC PT "6 Clicks" Mobility   Outcome Measure  Help needed turning from your back to your side while in a flat bed without using bedrails?: A Little Help needed moving from lying on your back to sitting on the side of a flat bed without using bedrails?: A Little Help needed moving to and from a bed to a chair (including a wheelchair)?: A Little Help needed standing up from a chair using your arms (e.g., wheelchair or bedside chair)?: A Little Help needed to walk in hospital room?: A Little Help needed climbing 3-5 steps with a railing? : A Lot 6 Click Score: 17    End of Session Equipment Utilized During Treatment: Gait belt Activity Tolerance: Patient tolerated treatment well Patient left: in chair;with call bell/phone within reach;with nursing/sitter in room Nurse Communication: Mobility status PT Visit Diagnosis: Muscle weakness (generalized) (M62.81);Difficulty in walking, not elsewhere classified (R26.2);Unsteadiness on feet (R26.81);Pain Pain - Right/Left: Left Pain - part of body: Hip     Time: 1010-1049 PT Time Calculation (min) (ACUTE ONLY): 39 min  Charges:  $Gait Training: 8-22 mins $Therapeutic Exercise: 8-22 mins $Neuromuscular Re-education: 8-22 mins                     Zadie Cleverly, PTA    Jannet Askew 10/01/2020, 10:59 AM

## 2020-10-01 NOTE — Progress Notes (Signed)
Occupational Therapy Treatment Patient Details Name: Chad [name] MRN: [id number] DOB: 1950/09/12 Today's Date: 10/01/2020    History of present illness 70 y.o. male with a past medical history of anxiety, CKD, gastric reflux, hypertension, past history of alcohol abuse now in remission per patient who presents to the emergency department after a fall.  According to the patient he has been very off balance and weak for many months.  States he went to a rehab facility after his last discharge in April after GI bleed.  Had L hip fx in Feb with ORIF and rehab, fell on L hip this time with c/o significant pain   OT comments  [name] presents today with generalized weakness, reduced endurance, flat affect, and endorses depression and anxiety. He moved fairly well, with no LOB and primarily with CGA/SUPV, for bed mobility, toileting, dressing, grooming, therex, but required encouragement for each task; if left to his own devices, pt would not have gotten out of bed. Discussion of DC options with pt, he admits that he needs some level of assistance at home, although unclear to this therapist that he requires 24-hr supervision. Early in therapy session, pt states that he had not been eating at home because he was "too tired and weak" to prepare food; however, he later admits that he believes it is depression that is leaving him too incapacitated to engage in basic self-care tasks. Discussion of food choices that would require little/no prep work, discussion of ways pt might go about addressing his depression (meds, sunlight, exercise, meditation, social interactions, physical activity, music, arts, regular schedule, improved sleep, improved nutrition, volunteer activity, etc.). Pt verbalized understanding and participated with interest in conversation, but displayed little evidence of having the self-efficacy necessary to take more initiative in improving his physical or mental health. This therapist  anticipates that pt's needs could be met with intermittent HHOT/PT, regular social/volunteer services such as "buddy program" offered through senior centers, Meals on Wheels, etc.   Follow Up Recommendations  Supervision - Intermittent;Home health OT    Equipment Recommendations       Recommendations for Other Services Other (comment) (social services, Meals on Wheels)    Precautions / Restrictions Precautions Precautions: Fall Restrictions Weight Bearing Restrictions: No       Mobility Bed Mobility Overal bed mobility: Needs Assistance Bed Mobility: Supine to Sit     Supine to sit: Supervision     General bed mobility comments: increased time required to complete tasks    Transfers Overall transfer level: Needs assistance Equipment used: Rolling walker (2 wheeled) Transfers: Sit to/from Stand Sit to Stand: Min guard         General transfer comment: cues for safety, increased time and effort    Balance Overall balance assessment: Needs assistance Sitting-balance support: Bilateral upper extremity supported Sitting balance-Leahy Scale: Good     Standing balance support: Bilateral upper extremity supported;Single extremity supported;During functional activity Standing balance-Leahy Scale: Fair Standing balance comment: able to maintain standing balance, grooming at sink, for 3+ minutes. Occassionally using countertop for support                           ADL either performed or assessed with clinical judgement   ADL   Eating/Feeding: Modified independent   Grooming: Oral care;Wash/dry face;Set up;Standing;Min guard           Upper Body Dressing : Set up;Sitting;Supervision/safety Upper Body Dressing Details (indicate cue type and reason): required  encouragement -- gown he was wearing was dirty (with spilled food), but pt did not want to change it, said it "was fine" to have a dirty gown Lower Body Dressing: Min guard;Sitting/lateral leans    Toilet Transfer: Ambulation;Min Fish farm manager Details (indicate cue type and reason): able to manuever around several obstacles (rolling table, computer stand) in room without difficulty or LOB Toileting- Clothing Manipulation and Hygiene: Supervision/safety;Cueing for sequencing Toileting - Clothing Manipulation Details (indicate cue type and reason): required cueing to wash hands after using toilet             Vision Baseline Vision/History: Wears glasses Wears Glasses: At all times Patient Visual Report: No change from baseline Additional Comments: pt states glasses are Rx lenses; no L lenses currently   Perception     Praxis      Cognition Arousal/Alertness: Awake/alert Behavior During Therapy: Flat affect;WFL for tasks assessed/performed Overall Cognitive Status: Within Functional Limits for tasks assessed                                 General Comments: Pt quite "down," flat        Exercises Other Exercises Other Exercises: Bed mobility, transfers, toileting, grooming, dressing. Discussion of DC options. Extensive discussion of ideas to address mood/depression. UE/LE seated therex. Educ re: HEP   Shoulder Instructions       General Comments      Pertinent Vitals/ Pain       Pain Assessment: No/denies pain  Home Living                                          Prior Functioning/Environment              Frequency  Min 1X/week        Progress Toward Goals  OT Goals(current goals can now be found in the care plan section)  Progress towards OT goals: Progressing toward goals  Acute Rehab OT Goals Patient Stated Goal: to feel better OT Goal Formulation: With patient Time For Goal Achievement: 10/12/20  Plan Discharge plan needs to be updated    Co-evaluation                 AM-PAC OT "6 Clicks" Daily Activity     Outcome Measure   Help from another person eating meals?: None Help from  another person taking care of personal grooming?: A Little Help from another person toileting, which includes using toliet, bedpan, or urinal?: A Little Help from another person bathing (including washing, rinsing, drying)?: A Little Help from another person to put on and taking off regular upper body clothing?: A Little Help from another person to put on and taking off regular lower body clothing?: A Little 6 Click Score: 19    End of Session Equipment Utilized During Treatment: Rolling walker  OT Visit Diagnosis: Unsteadiness on feet (R26.81);Muscle weakness (generalized) (M62.81);History of falling (Z91.81);Repeated falls (R29.6)   Activity Tolerance Patient tolerated treatment well   Patient Left in chair;with call bell/phone within reach;with chair alarm set   Nurse Communication          Time: 3005-1102 OT Time Calculation (min): 30 min  Charges: OT General Charges $OT Visit: 1 Visit OT Treatments $Self Care/Home Management : 23-37 mins  Josiah Lobo, PhD, MS, OTR/L 10/01/20, 2:31  PM

## 2020-10-01 NOTE — Plan of Care (Signed)

## 2020-10-01 NOTE — TOC Progression Note (Addendum)
Transition of Care [facility]) - Progression Note    Patient Details  Name: Chad [name] MRN: [id number] Date of Birth: 1950/09/21  [facility]) CM/SW Contact  Caryn Section, RN Phone Number: 10/01/2020, 10:47 AM  Clinical Narrative:   PAsRR obtained [id number] F  1055:  Insurance auth prepared, PASRR number communicated to [facility], awaiting response for SNF.    Barriers to Discharge: ED SNF auth  Expected Discharge Plan and Services                                                 Social Determinants of Health (SDOH) Interventions    Readmission Risk Interventions No flowsheet data found.

## 2020-10-01 NOTE — Progress Notes (Addendum)
Patient ID: Chad [name], male   DOB: 01/23/1951, 70 y.o.   MRN: [id number] [facility] PROGRESS NOTE  Chad [name] UXL:[id number] DOB: 09/08/1950 DOA: 09/25/2020 PCP: Chad Humphrey, MD  HPI/Subjective: Patient just feels weak and no energy.  Not much of an appetite.  Does not feel well at all.  Objective: Vitals:   10/01/20 1216 10/01/20 1606  BP: 125/75 127/75  Pulse: 81 (!) 57  Resp: 17 17  Temp: 97.8 F (36.6 C) 97.8 F (36.6 C)  SpO2: 98% 98%    Intake/Output Summary (Last 24 hours) at 10/01/2020 1607 Last data filed at 10/01/2020 1404 Gross per 24 hour  Intake 598 ml  Output 1400 ml  Net -802 ml   Filed Weights   09/25/20 1832 09/27/20 1227 09/27/20 1252  Weight: 65.8 kg 65.8 kg 65.8 kg    ROS: Review of Systems  Constitutional:  Positive for malaise/fatigue.  Respiratory:  Negative for shortness of breath.   Cardiovascular:  Negative for chest pain.  Gastrointestinal:  Negative for abdominal pain, nausea and vomiting.  Exam: Physical Exam HENT:     Head: Normocephalic.     Mouth/Throat:     Pharynx: No oropharyngeal exudate.  Eyes:     General: Lids are normal.     Conjunctiva/sclera: Conjunctivae normal.     Pupils: Pupils are equal, round, and reactive to light.  Cardiovascular:     Rate and Rhythm: Normal rate and regular rhythm.     Heart sounds: Normal heart sounds, S1 normal and S2 normal.  Pulmonary:     Breath sounds: No decreased breath sounds, wheezing, rhonchi or rales.  Abdominal:     Palpations: Abdomen is soft.     Tenderness: There is no abdominal tenderness.  Musculoskeletal:     Right lower leg: No swelling.     Left lower leg: No swelling.  Skin:    General: Skin is warm.     Findings: No rash.  Neurological:     Mental Status: He is alert and oriented to person, place, and time.     Data Reviewed: Basic Metabolic Panel: Recent Labs  Lab 09/25/20 2003 09/27/20 1026 09/28/20 0333 09/29/20 0417  NA 135  --  134*  136  K 4.2  --  3.4* 3.9  CL 100  --  102 105  CO2 27  --  24 26  GLUCOSE 109*  --  93 131*  BUN <5*  --  8 10  CREATININE 0.73  --  0.76 0.75  CALCIUM 9.1  --  8.3* 8.6*  MG  --  1.7  --   --   PHOS  --  4.6  --   --    Liver Function Tests: Recent Labs  Lab 09/25/20 2003  AST 35  ALT 16  ALKPHOS 91  BILITOT 0.9  PROT 7.0  ALBUMIN 3.8   No results for input(s): LIPASE, AMYLASE in the last 168 hours. No results for input(s): AMMONIA in the last 168 hours. CBC: Recent Labs  Lab 09/25/20 2003 09/28/20 0333 09/29/20 0417  WBC 4.8 4.8 5.9  HGB 14.8 12.4* 12.6*  HCT 44.1 37.4* 38.0*  MCV 93.2 92.6 92.9  PLT 125* 131* 136*   Cardiac Enzymes: No results for input(s): CKTOTAL, CKMB, CKMBINDEX, TROPONINI in the last 168 hours. BNP (last 3 results) Recent Labs    08/01/20 1127  BNP 184.4*    ProBNP (last 3 results) No results for input(s): PROBNP in the last 8760 hours.  CBG: No results for input(s): GLUCAP in the last 168 hours.  Recent Results (from the past 240 hour(s))  SARS CORONAVIRUS 2 (TAT 6-24 HRS) Nasopharyngeal Nasopharyngeal Swab     Status: None   Collection Time: 09/25/20  8:01 PM   Specimen: Nasopharyngeal Swab  Result Value Ref Range Status   SARS Coronavirus 2 NEGATIVE NEGATIVE Final    Comment: (NOTE) SARS-CoV-2 target nucleic acids are NOT DETECTED.  The SARS-CoV-2 RNA is generally detectable in upper and lower respiratory specimens during the acute phase of infection. Negative results do not preclude SARS-CoV-2 infection, do not rule out co-infections with other pathogens, and should not be used as the sole basis for treatment or other patient management decisions. Negative results must be combined with clinical observations, patient history, and epidemiological information. The expected result is Negative.  Fact Sheet for Patients: HairSlick.no  Fact Sheet for Healthcare  Providers: quierodirigir.com  This test is not yet approved or cleared by the [place] FDA and  has been authorized for detection and/or diagnosis of SARS-CoV-2 by FDA under an Emergency Use Authorization (EUA). This EUA will remain  in effect (meaning this test can be used) for the duration of the COVID-19 declaration under Se ction 564(b)(1) of the Act, 21 U.S.C. section 360bbb-3(b)(1), unless the authorization is terminated or revoked sooner.  Performed at [facility], [address], [place], [place] [address]        Scheduled Meds:  enoxaparin (LOVENOX) injection  40 mg Subcutaneous Q24H   escitalopram  10 mg Oral Daily   folic acid  1 mg Oral Daily   multivitamin with minerals  1 tablet Oral Daily   nicotine  21 mg Transdermal Daily   pantoprazole  40 mg Oral BID   polyethylene glycol  17 g Oral Daily   sodium chloride flush  3 mL Intravenous Q12H   thiamine  100 mg Oral Daily   Or   thiamine  100 mg Intravenous Daily   traZODone  100 mg Oral QHS   Continuous Infusions:  sodium chloride      Assessment/Plan:  Recurrent falls and ambulatory dysfunction.  MRI brain negative for stroke or mass.  Physical therapy recommending rehab.  Unsafe discharge home because he has numerous steps to get into his apartment.  Was not orthostatic.  Continue thiamine. Empty sella seen on MRI.  Initial a.m. cortisol in an indeterminate range.  Cosyntropin test did have appropriate response to ACTH.  Awaiting for baseline ACTH level.  This morning's a.m. cortisol even lower than the previous cortisol.  TSH and free T4 normal range.  FSH and LH normal range.  IGF-I pending.  Case discussed with endocrine and will start Cortef 20 mg in the morning and 10 mg in the afternoon. Depression anxiety on Lexapro and trazodone History of alcohol abuse.  Continue thiamine Hypokalemia.  Previous replaced Depression.  On trazodone at night, Lexapro during the day.   Nursing staff message psychiatry Dr. Toni Amend about whether depression could be contributing to the patient's symptoms.        Code Status:     Code Status Orders  (From admission, onward)           Start     Ordered   09/27/20 1024  Full code  Continuous        09/27/20 1026           Code Status History     Date Active Date Inactive Code Status Order  ID Comments User Context   08/01/2020 1655 08/14/2020 1847 Full Code [id number]  Eugenie Norrie, NP ED   05/02/2020 0058 05/03/2020 2039 Full Code [id number]  Briscoe Deutscher, MD ED   02/04/2020 1814 02/10/2020 2052 Full Code [id number]  Lurene Shadow, MD ED   01/14/2020 0247 01/19/2020 2357 Full Code [id number]  Mansy, Vernetta Honey, MD ED   01/14/2020 0144 01/14/2020 0247 Full Code [id number]  Nita Sickle, MD ED   12/31/2019 1631 01/06/2020 2346 Full Code [id number]  Gertha Calkin, MD ED   12/09/2019 1729 12/12/2019 1712 Full Code [id number]  Alford Highland, MD ED   02/20/2019 0944 02/20/2019 1734 Full Code [id number]  Chesley Noon, MD ED   01/23/2019 1648 01/25/2019 1957 Full Code [id number]  Mayo, Allyn Kenner, MD Inpatient   05/17/2018 2327 05/18/2018 1902 Full Code [id number]  Oralia Manis, MD Inpatient   05/17/2018 1950 05/17/2018 2327 Full Code [id number]  Nita Sickle, MD ED   12/24/2017 0803 12/25/2017 1645 Full Code [id number]  Arnaldo Natal, MD ED   06/07/2015 1619 06/08/2015 1754 Full Code [id number]  Shirlean Kelly, MD Inpatient      Disposition Plan: Status is: Observation  Dispo:  Patient From: Home  Planned Disposition: Skilled Nursing Facility, will be able to take tomorrow 10/02/2020.  Medically stable for discharge: Yes.   Time spent: 27 minutes.  Carnita Golob [employer]  [facility] [employer]

## 2020-10-01 NOTE — TOC Progression Note (Signed)
Transition of Care [facility]) - Progression Note    Patient Details  Name: Chad [name] MRN: [id number] Date of Birth: 02-16-1951  [facility]) CM/SW Contact  Caryn Section, RN Phone Number: 10/01/2020, 2:39 PM  Clinical Narrative:   As per [facility], Inetta Fermo, they cannot accept patient at their facility today, patient can be transferred tomorrow.  MD to order COVID for discharge.      Barriers to Discharge: ED SNF auth  Expected Discharge Plan and Services                                                 Social Determinants of Health (SDOH) Interventions    Readmission Risk Interventions No flowsheet data found.

## 2020-10-02 DIAGNOSIS — R262 Difficulty in walking, not elsewhere classified: Secondary | ICD-10-CM | POA: Diagnosis not present

## 2020-10-02 DIAGNOSIS — E2740 Unspecified adrenocortical insufficiency: Secondary | ICD-10-CM | POA: Diagnosis not present

## 2020-10-02 DIAGNOSIS — R296 Repeated falls: Secondary | ICD-10-CM | POA: Diagnosis not present

## 2020-10-02 DIAGNOSIS — E236 Other disorders of pituitary gland: Secondary | ICD-10-CM | POA: Diagnosis not present

## 2020-10-02 LAB — INSULIN-LIKE GROWTH FACTOR: Somatomedin C: 85 ng/mL (ref 59–230)

## 2020-10-02 LAB — GLUCOSE, CAPILLARY: Glucose-Capillary: 120 mg/dL — ABNORMAL HIGH (ref 70–99)

## 2020-10-02 LAB — PROLACTIN: Prolactin: 11 ng/mL (ref 4.0–15.2)

## 2020-10-02 LAB — ACTH: C206 ACTH: 18 pg/mL (ref 7.2–63.3)

## 2020-10-02 MED ORDER — FOLIC ACID 1 MG PO TABS: 1.0000 mg | ORAL_TABLET | Freq: Every day | ORAL | 0 refills | Status: DC

## 2020-10-02 MED ORDER — HYDROCODONE-ACETAMINOPHEN 5-325 MG PO TABS: 1.0000 | ORAL_TABLET | Freq: Four times a day (QID) | ORAL | 0 refills | Status: AC | PRN

## 2020-10-02 MED ORDER — PANTOPRAZOLE SODIUM 40 MG PO TBEC: 40.0000 mg | DELAYED_RELEASE_TABLET | Freq: Two times a day (BID) | ORAL | 0 refills | Status: DC

## 2020-10-02 MED ORDER — ESCITALOPRAM OXALATE 10 MG PO TABS: 1.0000 | ORAL_TABLET | Freq: Every day | ORAL | 0 refills | Status: DC

## 2020-10-02 MED ORDER — ADULT MULTIVITAMIN W/MINERALS CH: 1.0000 | ORAL_TABLET | Freq: Every day | ORAL | 0 refills | Status: DC

## 2020-10-02 MED ORDER — THIAMINE HCL 100 MG PO TABS: 100.0000 mg | ORAL_TABLET | Freq: Every day | ORAL | 0 refills | Status: DC

## 2020-10-02 MED ORDER — NICOTINE 21 MG/24HR TD PT24
MEDICATED_PATCH | TRANSDERMAL | 0 refills | Status: DC
Start: 2020-10-02 — End: 2021-01-26

## 2020-10-02 MED ORDER — HYDROCORTISONE 10 MG PO TABS: ORAL_TABLET | ORAL | 0 refills | Status: DC

## 2020-10-02 MED ORDER — TRAZODONE HCL 100 MG PO TABS: 100.0000 mg | ORAL_TABLET | Freq: Every day | ORAL | 0 refills | Status: DC

## 2020-10-02 MED ORDER — POLYETHYLENE GLYCOL 3350 17 G PO PACK: 17.0000 g | PACK | Freq: Every day | ORAL | 0 refills | Status: DC | PRN

## 2020-10-02 NOTE — Plan of Care (Signed)

## 2020-10-02 NOTE — Progress Notes (Signed)
Report attempted with no answer. Will continue to attempt

## 2020-10-02 NOTE — Progress Notes (Signed)
Pt discharged to [facility] via EMS transport. Report was unsuccessfully called to SNF, all instructions provided to transport personal with questions answered.   BP 129/72 (BP Location: Left Arm)   Pulse (!) 59   Temp 98.1 F (36.7 C) (Oral)   Resp 16   Ht 5\' 8"  (1.727 m)   Wt 65.8 kg   SpO2 100%   BMI 22.05 kg/m

## 2020-10-02 NOTE — TOC Transition Note (Signed)
Transition of Care [facility]) - CM/SW Discharge Note   Patient Details  Name: Chad [name] MRN: [id number] Date of Birth: 12-20-1950  Transition of Care [facility]) CM/SW Contact:  Caryn Section, RN Phone Number: 10/02/2020, 1:13 PM   Clinical Narrative:   [facility] go to [facility] today as per [facility] at [facility].  [facility] will pick patient up after 2pm.  Patient and family aware, care team aware.      Barriers to Discharge: ED SNF auth   Patient Goals and CMS Choice        Discharge Placement                       Discharge Plan and Services                                     Social Determinants of Health (SDOH) Interventions     Readmission Risk Interventions No flowsheet data found.

## 2020-10-02 NOTE — Discharge Summary (Signed)
[facility] [place] [facility]   PATIENT NAME: Chad [name]    MR#:  [id number]  DATE OF BIRTH:  [birth year]  DATE OF ADMISSION:  09/25/2020 ADMITTING PHYSICIAN: Lucile Shuttersochukwu Agbata, MD  DATE OF DISCHARGE: 10/02/2020  PRIMARY CARE PHYSICIAN: [name], [name], MD    ADMISSION DIAGNOSIS:  Frequent falls [R29.6] Contusion of left hip, initial encounter [S70.02XA] Fall, initial encounter [W19.XXXA]  DISCHARGE DIAGNOSIS:  Principal Problem:   Frequent falls Active Problems:   Depression with anxiety   Alcohol abuse   Adjustment disorder with mixed anxiety and depressed mood   Left hip pain   Nicotine dependence   Ambulatory dysfunction   Empty sella (HCC)   Low serum cortisol level (HCC)   SECONDARY DIAGNOSIS:   Past Medical History:  Diagnosis Date  . Anxiety    takes lexapro  . Chronic kidney disease    patient states he was told he has 'moderate kidney disease'  . Degenerative disc disease, lumbar   . Exposure to hepatitis C   . GERD (gastroesophageal reflux disease)    takes OTC as needed - omeprazole  . Headache    on occasion  . History of gallstones   . Hypertension    diet controlled  . Pneumonia approx 15 years ago    HOSPITAL COURSE:   Recurrent falls and ambulatory dysfunction.  Physical therapy recommends rehab.  The patient has numerous steps to get up to his apartment.  Unsafe disposition home.  Advised patient to go to rehab until he is able to climb up a flight of steps. Empty sella seen on MRI.  Initial a.m. cortisol was in indeterminate range.  When I did the cosyntropin test to repeat cortisol was less than 3.  I discussed case with Dr. Rich BraveSolon endocrinology and I started Cortef 20 mg in the morning and 10 mg in the afternoon.  ACTH level still pending.  If low goes along with pituitary cause.  If high, we would have to follow-up with adrenal imaging.  Dr. Tedd SiasSolum will follow up the patient as outpatient.  In 1 week Welton FlakesKhan try to taper  Cortef down to 15 mg in the morning and 7.5 mg in the afternoon. Depression anxiety.  Continue Lexapro and trazodone History of alcohol abuse.  Continue thiamine multivitamin and folic acid Hypokalemia.  Previous replace. Depression, anxiety  Continue trazodone and Lexapro. Previous hip fracture.  Patient asked for a few pills of pain medication to go out with.  DISCHARGE CONDITIONS:   Satisfactory  CONSULTS OBTAINED:    None  DRUG ALLERGIES:   Allergies  Allergen Reactions  . Morphine And Related Other (See Comments)    Causes "bad disposition"  . Ibuprofen Other (See Comments)    Gi upset    DISCHARGE MEDICATIONS:   Allergies as of 10/02/2020       Reactions   Morphine And Related Other (See Comments)   Causes "bad disposition"   Ibuprofen Other (See Comments)   Gi upset        Medication List     STOP taking these medications    feeding supplement Liqd   HYDROcodone-acetaminophen 7.5-325 MG tablet Commonly known as: NORCO Replaced by: HYDROcodone-acetaminophen 5-325 MG tablet   hydrOXYzine 25 MG capsule Commonly known as: Vistaril   sertraline 50 MG tablet Commonly known as: ZOLOFT       TAKE these medications    acetaminophen 325 MG tablet Commonly known as: TYLENOL Take 2 tablets (650 mg total) by  mouth every 6 (six) hours as needed for mild pain (pain score 1-3 or temp > 100.5).   escitalopram 10 MG tablet Commonly known as: LEXAPRO Take 1 tablet (10 mg total) by mouth daily.   folic acid 1 MG tablet Commonly known as: FOLVITE Take 1 tablet (1 mg total) by mouth daily.   HYDROcodone-acetaminophen 5-325 MG tablet Commonly known as: NORCO/VICODIN Take 1 tablet by mouth every 6 (six) hours as needed for up to 3 days for moderate pain or severe pain. Replaces: HYDROcodone-acetaminophen 7.5-325 MG tablet   hydrocortisone 10 MG tablet Commonly known as: CORTEF Two tabs po daily at 7am and one tablet po at 3pm   multivitamin with minerals  Tabs tablet Take 1 tablet by mouth daily.   nicotine 21 mg/24hr patch Commonly known as: NICODERM CQ - dosed in mg/24 hours One 21mg  patch chest wall daily (okay to substitute generic)   pantoprazole 40 MG tablet Commonly known as: Protonix Take 1 tablet (40 mg total) by mouth 2 (two) times daily.   polyethylene glycol 17 g packet Commonly known as: MIRALAX / GLYCOLAX Take 17 g by mouth daily as needed for moderate constipation.   thiamine 100 MG tablet Take 1 tablet (100 mg total) by mouth daily.   traZODone 100 MG tablet Commonly known as: DESYREL Take 1 tablet (100 mg total) by mouth at bedtime.         DISCHARGE INSTRUCTIONS:   Follow-up team at rehab 1 day Follow-up Dr. endocrinology as outpatient in a few weeks  If you experience worsening of your admission symptoms, develop shortness of breath, life threatening emergency, suicidal or homicidal thoughts you must seek medical attention immediately by calling 911 or calling your MD immediately  if symptoms less severe.  You Must read complete instructions/literature along with all the possible adverse reactions/side effects for all the Medicines you take and that have been prescribed to you. Take any new Medicines after you have completely understood and accept all the possible adverse reactions/side effects.   Please note  You were cared for by a hospitalist during your hospital stay. If you have any questions about your discharge medications or the care you received while you were in the hospital after you are discharged, you can call the unit and asked to speak with the hospitalist on call if the hospitalist that took care of you is not available. Once you are discharged, your primary care physician will handle any further medical issues. Please note that NO REFILLS for any discharge medications will be authorized once you are discharged, as it is imperative that you return to your primary care physician (or  establish a relationship with a primary care physician if you do not have one) for your aftercare needs so that they can reassess your need for medications and monitor your lab values.    Today   CHIEF COMPLAINT:   Chief Complaint  Patient presents with  . Fall    HISTORY OF PRESENT ILLNESS:  Ripley Bogosian  is a 70 y.o. male coming in with falls.   VITAL SIGNS:  Blood pressure 120/64, pulse (!) 55, temperature 98.2 F (36.8 C), temperature source Oral, resp. rate 16, height 5\' 8"  (1.727 m), weight 65.8 kg, SpO2 95 %.  I/O:   Intake/Output Summary (Last 24 hours) at 10/02/2020 0917 Last data filed at 10/01/2020 1843 Gross per 24 hour  Intake 960 ml  Output --  Net 960 ml    PHYSICAL EXAMINATION:  GENERAL:  70 y.o.-year-old patient lying in the bed with no acute distress.  EYES: Pupils equal, round, reactive to light and accommodation. No scleral icterus. HEENT: Head atraumatic, normocephalic. Oropharynx and nasopharynx clear.  LUNGS: Normal breath sounds bilaterally, no wheezing, rales,rhonchi or crepitation. No use of accessory muscles of respiration.  CARDIOVASCULAR: S1, S2 normal. No murmurs, rubs, or gallops.  ABDOMEN: Soft, non-tender, non-distended.  EXTREMITIES: No pedal edema.  NEUROLOGIC: Cranial nerves II through XII are intact. Muscle strength 5/5 in all extremities. Sensation intact. Gait not checked.  PSYCHIATRIC: The patient is alert and oriented x 3.  SKIN: No obvious rash, lesion, or ulcer.   DATA REVIEW:   CBC Recent Labs  Lab 09/29/20 0417  WBC 5.9  HGB 12.6*  HCT 38.0*  PLT 136*    Chemistries  Recent Labs  Lab 09/25/20 2003 09/27/20 1026 09/28/20 0333 09/29/20 0417  NA 135  --    < > 136  K 4.2  --    < > 3.9  CL 100  --    < > 105  CO2 27  --    < > 26  GLUCOSE 109*  --    < > 131*  BUN <5*  --    < > 10  CREATININE 0.73  --    < > 0.75  CALCIUM 9.1  --    < > 8.6*  MG  --  1.7  --   --   AST 35  --   --   --   ALT 16  --   --    --   ALKPHOS 91  --   --   --   BILITOT 0.9  --   --   --    < > = values in this interval not displayed.     Microbiology Results  Results for orders placed or performed during the hospital encounter of 09/25/20  SARS CORONAVIRUS 2 (TAT 6-24 HRS) Nasopharyngeal Nasopharyngeal Swab     Status: None   Collection Time: 09/25/20  8:01 PM   Specimen: Nasopharyngeal Swab  Result Value Ref Range Status   SARS Coronavirus 2 NEGATIVE NEGATIVE Final    Comment: (NOTE) SARS-CoV-2 target nucleic acids are NOT DETECTED.  The SARS-CoV-2 RNA is generally detectable in upper and lower respiratory specimens during the acute phase of infection. Negative results do not preclude SARS-CoV-2 infection, do not rule out co-infections with other pathogens, and should not be used as the sole basis for treatment or other patient management decisions. Negative results must be combined with clinical observations, patient history, and epidemiological information. The expected result is Negative.  Fact Sheet for Patients: HairSlick.no  Fact Sheet for Healthcare Providers: quierodirigir.com  This test is not yet approved or cleared by the [place] FDA and  has been authorized for detection and/or diagnosis of SARS-CoV-2 by FDA under an Emergency Use Authorization (EUA). This EUA will remain  in effect (meaning this test can be used) for the duration of the COVID-19 declaration under Se ction 564(b)(1) of the Act, 21 U.S.C. section 360bbb-3(b)(1), unless the authorization is terminated or revoked sooner.  Performed at [facility], [address] [place], [place] [address]   SARS CORONAVIRUS 2 (TAT 6-24 HRS) Nasopharyngeal Nasopharyngeal Swab     Status: None   Collection Time: 10/01/20  3:52 PM   Specimen: Nasopharyngeal Swab  Result Value Ref Range Status   SARS Coronavirus 2 NEGATIVE NEGATIVE Final    Comment: (NOTE)  SARS-CoV-2  target nucleic acids are NOT DETECTED.  The SARS-CoV-2 RNA is generally detectable in upper and lower respiratory specimens during the acute phase of infection. Negative results do not preclude SARS-CoV-2 infection, do not rule out co-infections with other pathogens, and should not be used as the sole basis for treatment or other patient management decisions. Negative results must be combined with clinical observations, patient history, and epidemiological information. The expected result is Negative.  Fact Sheet for Patients: HairSlick.no  Fact Sheet for Healthcare Providers: quierodirigir.com  This test is not yet approved or cleared by the [place] FDA and  has been authorized for detection and/or diagnosis of SARS-CoV-2 by FDA under an Emergency Use Authorization (EUA). This EUA will remain  in effect (meaning this test can be used) for the duration of the COVID-19 declaration under Se ction 564(b)(1) of the Act, 21 U.S.C. section 360bbb-3(b)(1), unless the authorization is terminated or revoked sooner.  Performed at [facility], [address], [place], [place] [address]       Management plans discussed with the patient, and he is in agreement.  CODE STATUS:     Code Status Orders  (From admission, onward)           Start     Ordered   09/27/20 1024  Full code  Continuous        09/27/20 1026           Code Status History     Date Active Date Inactive Code Status Order ID Comments User Context   08/01/2020 1655 08/14/2020 1847 Full Code [id number]  Eugenie Norrie, NP ED   05/02/2020 0058 05/03/2020 2039 Full Code [id number]  Briscoe Deutscher, MD ED   02/04/2020 1814 02/10/2020 2052 Full Code [id number]  Lurene Shadow, MD ED   01/14/2020 0247 01/19/2020 2357 Full Code [id number]  Mansy, Vernetta Honey, MD ED   01/14/2020 0144 01/14/2020 0247 Full Code [id number]  Nita Sickle, MD ED   12/31/2019 1631  01/06/2020 2346 Full Code [id number]  Gertha Calkin, MD ED   12/09/2019 1729 12/12/2019 1712 Full Code [id number]  Alford Highland, MD ED   02/20/2019 0944 02/20/2019 1734 Full Code [id number]  Chesley Noon, MD ED   01/23/2019 1648 01/25/2019 1957 Full Code [id number]  Mayo, Allyn Kenner, MD Inpatient   05/17/2018 2327 05/18/2018 1902 Full Code [id number]  Oralia Manis, MD Inpatient   05/17/2018 1950 05/17/2018 2327 Full Code [id number]  Nita Sickle, MD ED   12/24/2017 0803 12/25/2017 1645 Full Code [id number]  Arnaldo Natal, MD ED   06/07/2015 1619 06/08/2015 1754 Full Code [id number]  Shirlean Kelly, MD Inpatient       TOTAL TIME TAKING CARE OF THIS PATIENT: 35 minutes.    Alford Highland M.D on 10/02/2020 at 9:17 AM   [facility]  CC: Primary care physician; Rayetta Humphrey, MD

## 2020-10-02 NOTE — Plan of Care (Signed)
  Problem: Education: Goal: Knowledge of General Education information will improve Description: Including pain rating scale, medication(s)/side effects and non-pharmacologic comfort measures 10/02/2020 1324 by Jules Schick, RN Outcome: Completed/Met 10/02/2020 1044 by Jules Schick, RN Outcome: Progressing   Problem: Health Behavior/Discharge Planning: Goal: Ability to manage health-related needs will improve 10/02/2020 1324 by Jules Schick, RN Outcome: Completed/Met 10/02/2020 1044 by Jules Schick, RN Outcome: Progressing   Problem: Clinical Measurements: Goal: Ability to maintain clinical measurements within normal limits will improve 10/02/2020 1324 by Jules Schick, RN Outcome: Completed/Met 10/02/2020 1044 by Jules Schick, RN Outcome: Progressing Goal: Will remain free from infection 10/02/2020 1324 by Jules Schick, RN Outcome: Completed/Met 10/02/2020 1044 by Jules Schick, RN Outcome: Progressing Goal: Diagnostic test results will improve 10/02/2020 1324 by Jules Schick, RN Outcome: Completed/Met 10/02/2020 1044 by Jules Schick, RN Outcome: Progressing Goal: Respiratory complications will improve 10/02/2020 1324 by Jules Schick, RN Outcome: Completed/Met 10/02/2020 1044 by Jules Schick, RN Outcome: Progressing Goal: Cardiovascular complication will be avoided 10/02/2020 1324 by Jules Schick, RN Outcome: Completed/Met 10/02/2020 1044 by Jules Schick, RN Outcome: Progressing   Problem: Activity: Goal: Risk for activity intolerance will decrease 10/02/2020 1324 by Jules Schick, RN Outcome: Completed/Met 10/02/2020 1044 by Jules Schick, RN Outcome: Progressing   Problem: Nutrition: Goal: Adequate nutrition will be maintained 10/02/2020 1324 by Jules Schick, RN Outcome: Completed/Met 10/02/2020 1044 by Jules Schick, RN Outcome: Progressing   Problem: Coping: Goal: Level of anxiety will  decrease 10/02/2020 1324 by Jules Schick, RN Outcome: Completed/Met 10/02/2020 1044 by Jules Schick, RN Outcome: Progressing   Problem: Elimination: Goal: Will not experience complications related to bowel motility 10/02/2020 1324 by Jules Schick, RN Outcome: Completed/Met 10/02/2020 1044 by Jules Schick, RN Outcome: Progressing Goal: Will not experience complications related to urinary retention 10/02/2020 1324 by Jules Schick, RN Outcome: Completed/Met 10/02/2020 1044 by Jules Schick, RN Outcome: Progressing   Problem: Pain Managment: Goal: General experience of comfort will improve 10/02/2020 1324 by Jules Schick, RN Outcome: Completed/Met 10/02/2020 1044 by Jules Schick, RN Outcome: Progressing   Problem: Safety: Goal: Ability to remain free from injury will improve 10/02/2020 1324 by Jules Schick, RN Outcome: Completed/Met 10/02/2020 [place] by Jules Schick, RN Outcome: Progressing   Problem: Skin Integrity: Goal: Risk for impaired skin integrity will decrease 10/02/2020 1324 by Jules Schick, RN Outcome: Completed/Met 10/02/2020 1044 by Jules Schick, RN Outcome: Progressing

## 2020-12-21 ENCOUNTER — Emergency Department: Payer: Medicare HMO

## 2020-12-21 ENCOUNTER — Other Ambulatory Visit: Payer: Self-pay

## 2020-12-21 ENCOUNTER — Inpatient Hospital Stay
Admission: EM | Admit: 2020-12-21 | Discharge: 2021-01-04 | DRG: 885 | Disposition: A | Discharge status: Skilled Nursing Facility | Payer: Medicare HMO | Attending: Internal Medicine | Admitting: Internal Medicine

## 2020-12-21 DIAGNOSIS — E876 Hypokalemia: Secondary | ICD-10-CM | POA: Diagnosis present

## 2020-12-21 DIAGNOSIS — F10939 Alcohol use, unspecified with withdrawal, unspecified: Secondary | ICD-10-CM | POA: Diagnosis present

## 2020-12-21 DIAGNOSIS — K219 Gastro-esophageal reflux disease without esophagitis: Secondary | ICD-10-CM | POA: Diagnosis present

## 2020-12-21 DIAGNOSIS — I129 Hypertensive chronic kidney disease with stage 1 through stage 4 chronic kidney disease, or unspecified chronic kidney disease: Secondary | ICD-10-CM | POA: Diagnosis present

## 2020-12-21 DIAGNOSIS — N400 Enlarged prostate without lower urinary tract symptoms: Secondary | ICD-10-CM | POA: Diagnosis present

## 2020-12-21 DIAGNOSIS — D6959 Other secondary thrombocytopenia: Secondary | ICD-10-CM | POA: Diagnosis present

## 2020-12-21 DIAGNOSIS — F10239 Alcohol dependence with withdrawal, unspecified: Secondary | ICD-10-CM | POA: Diagnosis present

## 2020-12-21 DIAGNOSIS — Z79899 Other long term (current) drug therapy: Secondary | ICD-10-CM

## 2020-12-21 DIAGNOSIS — E440 Moderate protein-calorie malnutrition: Secondary | ICD-10-CM | POA: Diagnosis present

## 2020-12-21 DIAGNOSIS — F32A Depression, unspecified: Secondary | ICD-10-CM

## 2020-12-21 DIAGNOSIS — Z8701 Personal history of pneumonia (recurrent): Secondary | ICD-10-CM

## 2020-12-21 DIAGNOSIS — E2749 Other adrenocortical insufficiency: Secondary | ICD-10-CM | POA: Diagnosis present

## 2020-12-21 DIAGNOSIS — F419 Anxiety disorder, unspecified: Secondary | ICD-10-CM | POA: Diagnosis present

## 2020-12-21 DIAGNOSIS — Z9049 Acquired absence of other specified parts of digestive tract: Secondary | ICD-10-CM

## 2020-12-21 DIAGNOSIS — R079 Chest pain, unspecified: Secondary | ICD-10-CM

## 2020-12-21 DIAGNOSIS — Z811 Family history of alcohol abuse and dependence: Secondary | ICD-10-CM

## 2020-12-21 DIAGNOSIS — F1024 Alcohol dependence with alcohol-induced mood disorder: Secondary | ICD-10-CM | POA: Diagnosis present

## 2020-12-21 DIAGNOSIS — E512 Wernicke's encephalopathy: Secondary | ICD-10-CM | POA: Diagnosis present

## 2020-12-21 DIAGNOSIS — Z8711 Personal history of peptic ulcer disease: Secondary | ICD-10-CM

## 2020-12-21 DIAGNOSIS — I82409 Acute embolism and thrombosis of unspecified deep veins of unspecified lower extremity: Secondary | ICD-10-CM | POA: Diagnosis present

## 2020-12-21 DIAGNOSIS — F331 Major depressive disorder, recurrent, moderate: Secondary | ICD-10-CM | POA: Diagnosis not present

## 2020-12-21 DIAGNOSIS — G8929 Other chronic pain: Secondary | ICD-10-CM | POA: Diagnosis present

## 2020-12-21 DIAGNOSIS — F17210 Nicotine dependence, cigarettes, uncomplicated: Secondary | ICD-10-CM | POA: Diagnosis present

## 2020-12-21 DIAGNOSIS — N189 Chronic kidney disease, unspecified: Secondary | ICD-10-CM | POA: Diagnosis present

## 2020-12-21 DIAGNOSIS — Z885 Allergy status to narcotic agent status: Secondary | ICD-10-CM

## 2020-12-21 DIAGNOSIS — F1094 Alcohol use, unspecified with alcohol-induced mood disorder: Secondary | ICD-10-CM | POA: Diagnosis not present

## 2020-12-21 DIAGNOSIS — R45851 Suicidal ideations: Secondary | ICD-10-CM | POA: Diagnosis present

## 2020-12-21 DIAGNOSIS — F10230 Alcohol dependence with withdrawal, uncomplicated: Secondary | ICD-10-CM | POA: Diagnosis present

## 2020-12-21 DIAGNOSIS — I10 Essential (primary) hypertension: Secondary | ICD-10-CM | POA: Diagnosis present

## 2020-12-21 DIAGNOSIS — R5381 Other malaise: Secondary | ICD-10-CM | POA: Diagnosis present

## 2020-12-21 DIAGNOSIS — U071 COVID-19: Secondary | ICD-10-CM | POA: Diagnosis present

## 2020-12-21 DIAGNOSIS — R531 Weakness: Secondary | ICD-10-CM

## 2020-12-21 DIAGNOSIS — Y908 Blood alcohol level of 240 mg/100 ml or more: Secondary | ICD-10-CM | POA: Diagnosis present

## 2020-12-21 DIAGNOSIS — Z886 Allergy status to analgesic agent status: Secondary | ICD-10-CM

## 2020-12-21 DIAGNOSIS — K709 Alcoholic liver disease, unspecified: Secondary | ICD-10-CM | POA: Diagnosis present

## 2020-12-21 LAB — BASIC METABOLIC PANEL
Anion gap: 18 — ABNORMAL HIGH (ref 5–15)
BUN: 12 mg/dL (ref 8–23)
CO2: 19 mmol/L — ABNORMAL LOW (ref 22–32)
Calcium: 8.7 mg/dL — ABNORMAL LOW (ref 8.9–10.3)
Chloride: 101 mmol/L (ref 98–111)
Creatinine, Ser: 0.64 mg/dL (ref 0.61–1.24)
GFR, Estimated: >60 mL/min (ref >60)
Glucose, Bld: 106 mg/dL — ABNORMAL HIGH (ref 70–99)
Potassium: 4.3 mmol/L (ref 3.5–5.1)
Sodium: 138 mmol/L (ref 135–145)

## 2020-12-21 LAB — HEPATIC FUNCTION PANEL
ALT: 26 U/L (ref 0–44)
AST: 59 U/L — ABNORMAL HIGH (ref 15–41)
Albumin: 3.6 g/dL (ref 3.5–5.0)
Alkaline Phosphatase: 95 U/L (ref 38–126)
Bilirubin, Direct: 0.2 mg/dL (ref 0.0–0.2)
Indirect Bilirubin: 0.7 mg/dL (ref 0.3–0.9)
Total Bilirubin: 0.9 mg/dL (ref 0.3–1.2)
Total Protein: 6.7 g/dL (ref 6.5–8.1)

## 2020-12-21 LAB — URINALYSIS, COMPLETE (UACMP) WITH MICROSCOPIC
Bacteria, UA: NONE SEEN
Bilirubin Urine: NEGATIVE
Glucose, UA: NEGATIVE mg/dL
Hgb urine dipstick: NEGATIVE
Ketones, ur: NEGATIVE mg/dL
Leukocytes,Ua: NEGATIVE
Nitrite: NEGATIVE
Protein, ur: NEGATIVE mg/dL
Specific Gravity, Urine: 1.025 (ref 1.005–1.030)
pH: 5 (ref 5.0–8.0)

## 2020-12-21 LAB — ACETAMINOPHEN LEVEL: Acetaminophen (Tylenol), Serum: <10 ug/mL — ABNORMAL LOW (ref 10–30)

## 2020-12-21 LAB — URINE DRUG SCREEN, QUALITATIVE (ARMC ONLY)
Amphetamines, Ur Screen: NOT DETECTED
Barbiturates, Ur Screen: NOT DETECTED
Benzodiazepine, Ur Scrn: NOT DETECTED
Cannabinoid 50 Ng, Ur ~~LOC~~: NOT DETECTED
Cocaine Metabolite,Ur ~~LOC~~: NOT DETECTED
MDMA (Ecstasy)Ur Screen: NOT DETECTED
Methadone Scn, Ur: NOT DETECTED
Opiate, Ur Screen: NOT DETECTED
Phencyclidine (PCP) Ur S: NOT DETECTED
Tricyclic, Ur Screen: NOT DETECTED

## 2020-12-21 LAB — CBC
HCT: 46.1 % (ref 39.0–52.0)
Hemoglobin: 15.3 g/dL (ref 13.0–17.0)
MCH: 30.9 pg (ref 26.0–34.0)
MCHC: 33.2 g/dL (ref 30.0–36.0)
MCV: 93.1 fL (ref 80.0–100.0)
Platelets: 324 10*3/uL (ref 150–400)
RBC: 4.95 MIL/uL (ref 4.22–5.81)
RDW: 16.1 % — ABNORMAL HIGH (ref 11.5–15.5)
WBC: 6.7 10*3/uL (ref 4.0–10.5)
nRBC: 0 % (ref 0.0–0.2)

## 2020-12-21 LAB — SALICYLATE LEVEL: Salicylate Lvl: <7 mg/dL — ABNORMAL LOW (ref 7.0–30.0)

## 2020-12-21 LAB — ETHANOL: Alcohol, Ethyl (B): 250 mg/dL — ABNORMAL HIGH (ref <10)

## 2020-12-21 LAB — TROPONIN I (HIGH SENSITIVITY): Troponin I (High Sensitivity): 10 ng/L (ref <18)

## 2020-12-21 LAB — RESP PANEL BY RT-PCR (FLU A&B, COVID) ARPGX2
Influenza A by PCR: NEGATIVE
Influenza B by PCR: NEGATIVE
SARS Coronavirus 2 by RT PCR: POSITIVE — AB

## 2020-12-21 MED ORDER — LORAZEPAM 2 MG/ML IJ SOLN
0.0000 mg | Freq: Four times a day (QID) | INTRAMUSCULAR | Status: DC
  Administered 2020-12-22: 2 mg via INTRAVENOUS
  Filled 2020-12-21 (×2): qty 1

## 2020-12-21 MED ORDER — FOLIC ACID 1 MG PO TABS
1.0000 mg | ORAL_TABLET | Freq: Every day | ORAL | Status: DC
  Administered 2020-12-22 – 2021-01-04 (×14): 1 mg via ORAL
  Filled 2020-12-21 (×14): qty 1

## 2020-12-21 MED ORDER — TRAZODONE HCL 100 MG PO TABS: 100.0000 mg | ORAL_TABLET | Freq: Every day | ORAL | Status: DC

## 2020-12-21 MED ORDER — LORAZEPAM 2 MG PO TABS
0.0000 mg | ORAL_TABLET | Freq: Two times a day (BID) | ORAL | Status: DC
Start: 2020-12-24 — End: 2020-12-22

## 2020-12-21 MED ORDER — LORAZEPAM 2 MG PO TABS
0.0000 mg | ORAL_TABLET | Freq: Four times a day (QID) | ORAL | Status: DC
  Administered 2020-12-21 – 2020-12-22 (×3): 2 mg via ORAL
  Filled 2020-12-21 (×3): qty 1

## 2020-12-21 MED ORDER — TRAZODONE HCL 100 MG PO TABS
100.0000 mg | ORAL_TABLET | Freq: Every day | ORAL | Status: DC
  Administered 2020-12-21: 100 mg via ORAL
  Filled 2020-12-21: qty 1

## 2020-12-21 MED ORDER — THIAMINE HCL 100 MG PO TABS
100.0000 mg | ORAL_TABLET | Freq: Every day | ORAL | Status: DC
  Administered 2020-12-21: 100 mg via ORAL
  Filled 2020-12-21 (×4): qty 1

## 2020-12-21 MED ORDER — THIAMINE HCL 100 MG PO TABS
100.0000 mg | ORAL_TABLET | Freq: Every day | ORAL | Status: DC
  Administered 2020-12-22 – 2021-01-04 (×14): 100 mg via ORAL
  Filled 2020-12-21 (×13): qty 1

## 2020-12-21 MED ORDER — PANTOPRAZOLE SODIUM 40 MG PO TBEC
40.0000 mg | DELAYED_RELEASE_TABLET | Freq: Two times a day (BID) | ORAL | Status: DC
  Administered 2020-12-21 – 2021-01-04 (×28): 40 mg via ORAL
  Filled 2020-12-21 (×28): qty 1

## 2020-12-21 MED ORDER — LORAZEPAM 2 MG/ML IJ SOLN: 0.0000 mg | Freq: Two times a day (BID) | INTRAMUSCULAR | Status: DC

## 2020-12-21 MED ORDER — ESCITALOPRAM OXALATE 10 MG PO TABS
10.0000 mg | ORAL_TABLET | Freq: Every day | ORAL | Status: DC
  Administered 2020-12-22 – 2021-01-04 (×14): 10 mg via ORAL
  Filled 2020-12-21 (×15): qty 1

## 2020-12-21 MED ORDER — THIAMINE HCL 100 MG/ML IJ SOLN
100.0000 mg | Freq: Every day | INTRAMUSCULAR | Status: DC
  Administered 2020-12-22 – 2020-12-26 (×4): 100 mg via INTRAVENOUS
  Filled 2020-12-21 (×5): qty 2

## 2020-12-21 NOTE — ED Triage Notes (Signed)
FIRST NURSE NOTE- arrived with [place] [employer] as voluntary psych eval.  Per officer neighbor that looks out for patient called because they went over and feces all over his place.  NAD.  Will go straight to decon shower with NT then come back for triage.  Pt is covered in feces.

## 2020-12-21 NOTE — ED Triage Notes (Signed)
Pt. To ED via PD, states his neighbor called PD because they were concerned about him. Pt. Verbalizes SI x "several days". Pt. States he has had CP today with nausea.

## 2020-12-21 NOTE — ED Notes (Signed)
Lab was called to add on the hepatic function panel.

## 2020-12-21 NOTE — ED Notes (Addendum)
Pt is covered in feces.  Pt taken to decon room, showered, hair washed, and given clean burgundy scrubs.  Belongings bagged.  Pt waiting in middle hall awaiting a bed.  Ice chips provided.  Pt keeps screaming "help me".

## 2020-12-21 NOTE — ED Notes (Addendum)
Pt informed that Apolinar Junes, RN stated that he done had meds to calm his nerves and that he could not have anything else. Pt called this tech a hateful bitch. Lights turned off per pt request. Pt continues to yell "help me" every time someone walks out of his rm.

## 2020-12-21 NOTE — ED Notes (Signed)
Pt remains in middle of triage area so staff can make sure pt remains safe while waiting for room to open.

## 2020-12-21 NOTE — ED Notes (Signed)
Pt yelling at top of lungs 'help me, I am dying of thirst,' multiple staff explaining that pt cannot have water due to c/o chest pain, ice chips provided

## 2020-12-21 NOTE — ED Notes (Signed)
Pt yelling "help me." When asked pt what he needed, pt stated, "you are just now asking me? Nobody cares about me. You don't give a fuck about me. You don't give a damn about me." Pt informed that RN was just in the rm with him. Pt stated, "I need something for the shakes."

## 2020-12-21 NOTE — ED Notes (Signed)
Pt once again hollering "help me" from rm. This tech went in rm to talk to pt. Pt asked "Could you just stay in here and talk to me while I am here?" Pt made aware that I could not stay in the rm with him throughout his stay here. Pt hollering, "nobody gives a fuck about anybody here. I need something to calm my nerves. Help me! You just be that way. You are a hateful person." MD Paduchowski made aware that pt is requesting something to help calm his nerves.
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
## 2020-12-21 NOTE — ED Notes (Addendum)
Pt continuing to yell at top of lungs 'help me,' pt has been asked by multiple staff members to stop yelling 'I need some water, yall are assholes.' has been told by multiple staff members to remain NPO due to c/o chest pain 'I dont have pain anymore'

## 2020-12-22 DIAGNOSIS — F32A Depression, unspecified: Secondary | ICD-10-CM | POA: Diagnosis not present

## 2020-12-22 DIAGNOSIS — Z8711 Personal history of peptic ulcer disease: Secondary | ICD-10-CM | POA: Diagnosis not present

## 2020-12-22 DIAGNOSIS — R9431 Abnormal electrocardiogram [ECG] [EKG]: Secondary | ICD-10-CM

## 2020-12-22 DIAGNOSIS — R45851 Suicidal ideations: Secondary | ICD-10-CM | POA: Diagnosis present

## 2020-12-22 DIAGNOSIS — E236 Other disorders of pituitary gland: Secondary | ICD-10-CM

## 2020-12-22 DIAGNOSIS — R531 Weakness: Secondary | ICD-10-CM | POA: Diagnosis present

## 2020-12-22 DIAGNOSIS — F331 Major depressive disorder, recurrent, moderate: Secondary | ICD-10-CM | POA: Diagnosis present

## 2020-12-22 DIAGNOSIS — E2749 Other adrenocortical insufficiency: Secondary | ICD-10-CM | POA: Diagnosis present

## 2020-12-22 DIAGNOSIS — F10239 Alcohol dependence with withdrawal, unspecified: Secondary | ICD-10-CM | POA: Diagnosis not present

## 2020-12-22 DIAGNOSIS — G8929 Other chronic pain: Secondary | ICD-10-CM | POA: Diagnosis present

## 2020-12-22 DIAGNOSIS — U071 COVID-19: Secondary | ICD-10-CM

## 2020-12-22 DIAGNOSIS — Z886 Allergy status to analgesic agent status: Secondary | ICD-10-CM | POA: Diagnosis not present

## 2020-12-22 DIAGNOSIS — E440 Moderate protein-calorie malnutrition: Secondary | ICD-10-CM | POA: Diagnosis present

## 2020-12-22 DIAGNOSIS — D696 Thrombocytopenia, unspecified: Secondary | ICD-10-CM | POA: Diagnosis not present

## 2020-12-22 DIAGNOSIS — N189 Chronic kidney disease, unspecified: Secondary | ICD-10-CM | POA: Diagnosis present

## 2020-12-22 DIAGNOSIS — F419 Anxiety disorder, unspecified: Secondary | ICD-10-CM | POA: Diagnosis present

## 2020-12-22 DIAGNOSIS — E876 Hypokalemia: Secondary | ICD-10-CM | POA: Diagnosis present

## 2020-12-22 DIAGNOSIS — F1094 Alcohol use, unspecified with alcohol-induced mood disorder: Secondary | ICD-10-CM

## 2020-12-22 DIAGNOSIS — Y908 Blood alcohol level of 240 mg/100 ml or more: Secondary | ICD-10-CM | POA: Diagnosis present

## 2020-12-22 DIAGNOSIS — Z811 Family history of alcohol abuse and dependence: Secondary | ICD-10-CM | POA: Diagnosis not present

## 2020-12-22 DIAGNOSIS — F17210 Nicotine dependence, cigarettes, uncomplicated: Secondary | ICD-10-CM | POA: Diagnosis present

## 2020-12-22 DIAGNOSIS — K709 Alcoholic liver disease, unspecified: Secondary | ICD-10-CM | POA: Diagnosis present

## 2020-12-22 DIAGNOSIS — E512 Wernicke's encephalopathy: Secondary | ICD-10-CM | POA: Diagnosis present

## 2020-12-22 DIAGNOSIS — Z885 Allergy status to narcotic agent status: Secondary | ICD-10-CM | POA: Diagnosis not present

## 2020-12-22 DIAGNOSIS — F1024 Alcohol dependence with alcohol-induced mood disorder: Secondary | ICD-10-CM | POA: Diagnosis present

## 2020-12-22 DIAGNOSIS — Z8701 Personal history of pneumonia (recurrent): Secondary | ICD-10-CM | POA: Diagnosis not present

## 2020-12-22 DIAGNOSIS — R5381 Other malaise: Secondary | ICD-10-CM | POA: Diagnosis not present

## 2020-12-22 DIAGNOSIS — F10230 Alcohol dependence with withdrawal, uncomplicated: Secondary | ICD-10-CM | POA: Diagnosis present

## 2020-12-22 DIAGNOSIS — D6959 Other secondary thrombocytopenia: Secondary | ICD-10-CM | POA: Diagnosis present

## 2020-12-22 DIAGNOSIS — Z9049 Acquired absence of other specified parts of digestive tract: Secondary | ICD-10-CM | POA: Diagnosis not present

## 2020-12-22 DIAGNOSIS — K219 Gastro-esophageal reflux disease without esophagitis: Secondary | ICD-10-CM | POA: Diagnosis present

## 2020-12-22 DIAGNOSIS — N400 Enlarged prostate without lower urinary tract symptoms: Secondary | ICD-10-CM | POA: Diagnosis present

## 2020-12-22 DIAGNOSIS — I129 Hypertensive chronic kidney disease with stage 1 through stage 4 chronic kidney disease, or unspecified chronic kidney disease: Secondary | ICD-10-CM | POA: Diagnosis present

## 2020-12-22 LAB — TROPONIN I (HIGH SENSITIVITY): Troponin I (High Sensitivity): 12 ng/L (ref <18)

## 2020-12-22 MED ORDER — ADULT MULTIVITAMIN W/MINERALS CH
1.0000 | ORAL_TABLET | Freq: Every day | ORAL | Status: DC
  Administered 2020-12-22 – 2021-01-04 (×14): 1 via ORAL
  Filled 2020-12-22 (×14): qty 1

## 2020-12-22 MED ORDER — SODIUM CHLORIDE 0.9 % IV SOLN
200.0000 mg | Freq: Once | INTRAVENOUS | Status: DC
  Filled 2020-12-22: qty 40

## 2020-12-22 MED ORDER — LORAZEPAM 2 MG/ML IJ SOLN
1.0000 mg | INTRAMUSCULAR | Status: AC | PRN
  Administered 2020-12-23 (×2): 4 mg via INTRAVENOUS
  Administered 2020-12-23: 3 mg via INTRAVENOUS
  Filled 2020-12-22 (×3): qty 2

## 2020-12-22 MED ORDER — HYDROCORTISONE 10 MG PO TABS
20.0000 mg | ORAL_TABLET | Freq: Every day | ORAL | Status: DC
  Administered 2020-12-23 – 2021-01-04 (×13): 20 mg via ORAL
  Filled 2020-12-22 (×14): qty 2

## 2020-12-22 MED ORDER — ACETAMINOPHEN 650 MG RE SUPP: 650.0000 mg | Freq: Four times a day (QID) | RECTAL | Status: DC | PRN

## 2020-12-22 MED ORDER — SODIUM CHLORIDE 0.9% FLUSH
3.0000 mL | Freq: Two times a day (BID) | INTRAVENOUS | Status: DC
  Administered 2020-12-22 – 2021-01-02 (×23): 3 mL via INTRAVENOUS

## 2020-12-22 MED ORDER — ACETAMINOPHEN 325 MG PO TABS
650.0000 mg | ORAL_TABLET | Freq: Four times a day (QID) | ORAL | Status: DC | PRN
  Administered 2020-12-23 – 2020-12-25 (×3): 650 mg via ORAL
  Filled 2020-12-22 (×3): qty 2

## 2020-12-22 MED ORDER — TRAZODONE HCL 100 MG PO TABS
100.0000 mg | ORAL_TABLET | Freq: Every day | ORAL | Status: DC
  Administered 2020-12-22 – 2020-12-26 (×5): 100 mg via ORAL
  Filled 2020-12-22 (×5): qty 1

## 2020-12-22 MED ORDER — ONDANSETRON HCL 4 MG/2ML IJ SOLN: 4.0000 mg | Freq: Four times a day (QID) | INTRAMUSCULAR | Status: DC | PRN

## 2020-12-22 MED ORDER — TRAZODONE HCL 50 MG PO TABS: 50.0000 mg | ORAL_TABLET | Freq: Every day | ORAL | Status: DC

## 2020-12-22 MED ORDER — POLYETHYLENE GLYCOL 3350 17 G PO PACK: 17.0000 g | PACK | Freq: Every day | ORAL | Status: DC | PRN

## 2020-12-22 MED ORDER — HYDROCORTISONE 10 MG PO TABS
10.0000 mg | ORAL_TABLET | Freq: Every evening | ORAL | Status: DC
  Administered 2020-12-22 – 2021-01-04 (×14): 10 mg via ORAL
  Filled 2020-12-22 (×14): qty 1

## 2020-12-22 MED ORDER — TRAZODONE HCL 100 MG PO TABS: 100.0000 mg | ORAL_TABLET | Freq: Every day | ORAL | Status: DC

## 2020-12-22 MED ORDER — NIRMATRELVIR/RITONAVIR (PAXLOVID)TABLET
3.0000 | ORAL_TABLET | Freq: Two times a day (BID) | ORAL | Status: DC
Start: 2020-12-22 — End: 2020-12-22
  Administered 2020-12-22: 3 via ORAL
  Filled 2020-12-22: qty 30

## 2020-12-22 MED ORDER — LACTATED RINGERS IV BOLUS
1000.0000 mL | Freq: Once | INTRAVENOUS | Status: AC
  Administered 2020-12-22: 1000 mL via INTRAVENOUS

## 2020-12-22 MED ORDER — SODIUM CHLORIDE 0.9 % IV SOLN: 100.0000 mg | Freq: Every day | INTRAVENOUS | Status: DC

## 2020-12-22 MED ORDER — LORAZEPAM 1 MG PO TABS
1.0000 mg | ORAL_TABLET | ORAL | Status: AC | PRN
  Administered 2020-12-22: 1 mg via ORAL
  Administered 2020-12-22: 2 mg via ORAL
  Administered 2020-12-23: 3 mg via ORAL
  Administered 2020-12-23 – 2020-12-24 (×5): 2 mg via ORAL
  Administered 2020-12-25: 1 mg via ORAL
  Administered 2020-12-25: 2 mg via ORAL
  Filled 2020-12-22: qty 2
  Filled 2020-12-22: qty 1
  Filled 2020-12-22 (×5): qty 2
  Filled 2020-12-22: qty 1
  Filled 2020-12-22: qty 2
  Filled 2020-12-22: qty 4
  Filled 2020-12-22: qty 2

## 2020-12-22 MED ORDER — ONDANSETRON HCL 4 MG PO TABS: 4.0000 mg | ORAL_TABLET | Freq: Four times a day (QID) | ORAL | Status: DC | PRN

## 2020-12-22 MED ORDER — GABAPENTIN 100 MG PO CAPS
200.0000 mg | ORAL_CAPSULE | Freq: Three times a day (TID) | ORAL | Status: DC
  Administered 2020-12-22: 200 mg via ORAL
  Filled 2020-12-22: qty 2
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
## 2020-12-22 NOTE — BH Assessment (Signed)
This writer attempted to assess pt; however, pt was unable to participate in the assessment. TTS to follow up.

## 2020-12-22 NOTE — Progress Notes (Signed)
Remdesivir - Pharmacy Brief Note   O:  ALT: 26 U/L CXR: Low lung volumes with bibasilar atelectasis SpO2: 92% on room air   A/P:  start remdesivir 200 mg IVPB once followed by 100 mg IVPB daily x 4 days.   Burnis Medin, PharmD 12/22/2020 1:27 PM

## 2020-12-22 NOTE — Discharge Instructions (Addendum)
Follow up with [facility] for therapy and substance abuse assistance.  [facility]  [place]: [address] [place], [place] [address] Hours:  Closed ? Oneita Jolly Phone: [phone]

## 2020-12-22 NOTE — ED Notes (Signed)
Patient urinated and defected all over self. 2 RNs completed bed bath and placed in clean gown at this time.
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
## 2020-12-22 NOTE — ED Notes (Signed)
Pt sleeping NADN 

## 2020-12-22 NOTE — ED Notes (Signed)
Informed RN bed assigned 

## 2020-12-22 NOTE — ED Notes (Signed)
Gave pt breakfast tray

## 2020-12-22 NOTE — ED Provider Notes (Signed)
  Clinical Course as of 12/22/20 1332  Sat Dec 22, 2020  1056 Patient seen and evaluated by psychiatry nurse practitioner who has cleared the patient from their perspective.  I reassessed the patient, and he has tolerated full meal tray without emesis or complications.  He is reporting some residual chest pain.  He has mild tachycardia to the low 100s.  We discussed to repeat troponin, IV fluids and the possibility of outpatient management.  He says he does not care.  Denies suicidality. [DS]  1307 Reassessed.  Even with my assistance, patient is unable to sit up on the side of the bed, let alone stand or ambulate.  Heart rate is improved with fluids, and his sats remained in the low 90s without significant hypoxia.  On concern for unsafe discharge plan.  We will discussed the case with hospitalist for admission considering his acute COVID status and weakness associated with this. [DS]  1331 I discuss the case with Dr. Crissie Reese, who agrees to admit [DS]    Clinical Course User Index [DS] Delton Prairie, MD      Delton Prairie, MD 12/22/20 1332

## 2020-12-22 NOTE — ED Notes (Signed)
Admitting at bedside 

## 2020-12-22 NOTE — ED Notes (Signed)
VOL/pending psych consult 

## 2020-12-23 ENCOUNTER — Encounter: Payer: Self-pay | Admitting: Family Medicine

## 2020-12-23 LAB — CBC
HCT: 33.7 % — ABNORMAL LOW (ref 39.0–52.0)
Hemoglobin: 11.4 g/dL — ABNORMAL LOW (ref 13.0–17.0)
MCH: 32.1 pg (ref 26.0–34.0)
MCHC: 33.8 g/dL (ref 30.0–36.0)
MCV: 94.9 fL (ref 80.0–100.0)
Platelets: 140 10*3/uL — ABNORMAL LOW (ref 150–400)
RBC: 3.55 MIL/uL — ABNORMAL LOW (ref 4.22–5.81)
RDW: 15.9 % — ABNORMAL HIGH (ref 11.5–15.5)
WBC: 4 10*3/uL (ref 4.0–10.5)
nRBC: 0 % (ref 0.0–0.2)

## 2020-12-23 LAB — COMPREHENSIVE METABOLIC PANEL
ALT: 27 U/L (ref 0–44)
AST: 64 U/L — ABNORMAL HIGH (ref 15–41)
Albumin: 2.8 g/dL — ABNORMAL LOW (ref 3.5–5.0)
Alkaline Phosphatase: 98 U/L (ref 38–126)
Anion gap: 7 (ref 5–15)
BUN: 6 mg/dL — ABNORMAL LOW (ref 8–23)
CO2: 26 mmol/L (ref 22–32)
Calcium: 8.4 mg/dL — ABNORMAL LOW (ref 8.9–10.3)
Chloride: 105 mmol/L (ref 98–111)
Creatinine, Ser: 0.65 mg/dL (ref 0.61–1.24)
GFR, Estimated: >60 mL/min (ref >60)
Glucose, Bld: 112 mg/dL — ABNORMAL HIGH (ref 70–99)
Potassium: 3.6 mmol/L (ref 3.5–5.1)
Sodium: 138 mmol/L (ref 135–145)
Total Bilirubin: 1.1 mg/dL (ref 0.3–1.2)
Total Protein: 5.5 g/dL — ABNORMAL LOW (ref 6.5–8.1)

## 2020-12-23 LAB — PHOSPHORUS: Phosphorus: 4.1 mg/dL (ref 2.5–4.6)

## 2020-12-23 LAB — MAGNESIUM: Magnesium: 1.8 mg/dL (ref 1.7–2.4)

## 2020-12-23 MED ORDER — NIRMATRELVIR/RITONAVIR (PAXLOVID)TABLET
3.0000 | ORAL_TABLET | Freq: Two times a day (BID) | ORAL | Status: AC
Start: 2020-12-23 — End: 2020-12-27
  Administered 2020-12-23 – 2020-12-27 (×10): 3 via ORAL
  Filled 2020-12-23: qty 3

## 2020-12-23 MED ORDER — HYDROCOD POLST-CPM POLST ER 10-8 MG/5ML PO SUER: 5.0000 mL | Freq: Two times a day (BID) | ORAL | Status: DC | PRN

## 2020-12-23 MED ORDER — HALOPERIDOL LACTATE 5 MG/ML IJ SOLN
5.0000 mg | Freq: Four times a day (QID) | INTRAMUSCULAR | Status: DC | PRN
  Administered 2020-12-23: 5 mg via INTRAVENOUS
  Filled 2020-12-23 (×2): qty 1

## 2020-12-23 NOTE — Evaluation (Signed)
Occupational Therapy Evaluation Patient Details Name: Chad [name] MRN: [id number] DOB: 24-Mar-1951 Today's Date: 12/23/2020    History of Present Illness 70 y.o. male with hx of EtOH use disorder, major depression, BPH, hypertension, DVT, and acute encephalopathy, who presented to the ED (after his neighbor called the police department) with suicidal ideation, chest pain, and weakness.  Per chart review, pt was found in poor living conditions, covered in feces.  Psychiatry deemed him not a risk to self or others and not meeting criteria for psychiatric admission. Pt was found to pt COVID+   Clinical Impression   Pt seen for OT evaluation this date. Pt yelling for help upon arrival, however did not state reason for needing help and agreeable to OT eval (able to consistently follow 1-step directions with multi-modal cues). Pt alert and oriented to self, place, and some parts of situation. Pt reports that he was MOD-I with RW for functional mobility and ADLs prior to admission, however per chart review, pt was found at home covered in feces. Pt lives alone and endorses "countless falls" over past 6 months.   Pt currently presents with decreased strength, activity tolerance, and awareness of deficits. Due to these functional impairments, pt required MIN A for supine>sit transfer, SUPERVISION/SET-UP for seated grooming tasks, MIN A for sit<>stand transfers, and MIN A to take lateral steps at EOB with RW. During session, pt reporting "having the shakes" and RN providing Ativan via IV. Following Ativan administration, pt becoming lethargic and requiring MAX A to return to supine. Pt left in bed, with all needs within reach, and in no acute distress. Pt would benefit from additional skilled OT services to maximize return to PLOF and minimize risk of future falls, injury, caregiver burden, and readmission. Upon discharge, recommend SNF.      Follow Up Recommendations  SNF    Equipment Recommendations   Other (comment) (defer to next venue of care)       Precautions / Restrictions Precautions Precautions: Fall Restrictions Weight Bearing Restrictions: No      Mobility Bed Mobility Overal bed mobility: Needs Assistance Bed Mobility: Supine to Sit;Sit to Supine     Supine to sit: Min guard;HOB elevated Sit to supine: Max assist;HOB elevated   General bed mobility comments: MIN GUARD + bedrail use for supine>sit. MAX A for return to supine d/t lethargy following Ativan administration    Transfers Overall transfer level: Needs assistance Equipment used: Rolling walker (2 wheeled) Transfers: Sit to/from Stand Sit to Stand: Min assist              Balance Overall balance assessment: Needs assistance Sitting-balance support: Single extremity supported;Feet supported Sitting balance-Leahy Scale: Good Sitting balance - Comments: SUPERVISION sitting EOB while reaching within BOS   Standing balance support: Bilateral upper extremity supported;During functional activity Standing balance-Leahy Scale: Poor Standing balance comment: MIN A to maintain standing balance with U/E support on RW while taking lateral steps                           ADL either performed or assessed with clinical judgement   ADL Overall ADL's : Needs assistance/impaired     Grooming: Wash/dry face;Supervision/safety;Set up;Sitting Grooming Details (indicate cue type and reason): to wash/dry face sitting EOB                             Functional mobility during ADLs: Minimal assistance;Rolling walker  Pertinent Vitals/Pain Pain Assessment: No/denies pain        Extremity/Trunk Assessment Upper Extremity Assessment Upper Extremity Assessment: Generalized weakness   Lower Extremity Assessment Lower Extremity Assessment: Generalized weakness          Cognition Arousal/Alertness: Awake/alert;Lethargic Behavior During Therapy: Restless Overall Cognitive  Status: No family/caregiver present to determine baseline cognitive functioning                                 General Comments: Pt yelling for help upon arrival, however did not state reason and agreeable to OT eval. Pt alert and oriented to self, place, and some parts of situation. Session limited by lethargy following receiving Ativan from RN during session              Home Living Family/patient expects to be discharged to:: Private residence Living Arrangements: Alone Available Help at Discharge: Friend(s);Available PRN/intermittently Type of Home: Apartment Home Access: Stairs to enter Entrance Stairs-Number of Steps: FF Entrance Stairs-Rails: Can reach both Home Layout: One level     Bathroom Shower/Tub: Tub/shower unit         Home Equipment: Environmental consultant - 2 wheels;Cane - single point;Shower seat - built in   Additional Comments: Some home set-up information obtained from previous addition d/t pt unable to provide all home set-up information      Prior Functioning/Environment Level of Independence: Needs assistance  Gait / Transfers Assistance Needed: Pt reports using a RW for functional mobility. Reports "countless falls" over past 6 months. ADL's / Homemaking Assistance Needed: Pt reports he is independent with ADLs. Per chart review, pt was found at home covered in feces            OT Problem List: Decreased strength;Decreased activity tolerance;Impaired balance (sitting and/or standing);Decreased cognition      OT Treatment/Interventions: Self-care/ADL training;Therapeutic exercise;Energy conservation;DME and/or AE instruction;Therapeutic activities;Patient/family education    OT Goals(Current goals can be found in the care plan section) Acute Rehab OT Goals OT Goal Formulation: Patient unable to participate in goal setting Time For Goal Achievement: 01/06/21 ADL Goals Pt Will Perform Grooming: with min guard assist;standing Pt Will Perform  Lower Body Dressing: with min guard assist;sit to/from stand Pt Will Transfer to Toilet: with min guard assist;ambulating;regular height toilet  OT Frequency: Min 2X/week    AM-PAC OT "6 Clicks" Daily Activity     Outcome Measure Help from another person eating meals?: None Help from another person taking care of personal grooming?: A Little Help from another person toileting, which includes using toliet, bedpan, or urinal?: A Lot Help from another person bathing (including washing, rinsing, drying)?: A Lot Help from another person to put on and taking off regular upper body clothing?: A Little Help from another person to put on and taking off regular lower body clothing?: A Lot 6 Click Score: 16   End of Session Equipment Utilized During Treatment: Rolling walker Nurse Communication: Mobility status  Activity Tolerance: Patient limited by lethargy Patient left: in bed;with call bell/phone within reach;with bed alarm set  OT Visit Diagnosis: Unsteadiness on feet (R26.81);History of falling (Z91.81);Muscle weakness (generalized) (M62.81)                Time: 1015-1040 OT Time Calculation (min): 25 min Charges:  OT General Charges $OT Visit: 1 Visit OT Evaluation $OT Eval Moderate Complexity: 1 Mod OT Treatments $Self Care/Home Management : 8-22 mins  Laydon Martis D Shanikqua Zarzycki, OTR/L  [facility] [phone]

## 2020-12-23 NOTE — Progress Notes (Signed)
PT Cancellation Note  Patient Details Name: Chad [name] MRN: [id number] DOB: 02-08-1951   Cancelled Treatment:    Reason Eval/Treat Not Completed: Patient's level of consciousness OT was able to initiate exam this AM and apparently he was able to do some minimal standing.  However apparently nursing administered IV Ativan and he very quickly became to lethargic to continue mobility, etc.  Not appropriate for PT at this time.  Will maintain on caseload and continue to follow at at distance.  Malachi Pro, DPT  12/23/2020, 2:11 PM

## 2020-12-23 NOTE — Progress Notes (Signed)
Dr. Maryfrances Bunnell notified of CIWA score of 18

## 2020-12-23 NOTE — Progress Notes (Signed)
Dr. Maryfrances Bunnell notified about how much Ativan Mr. Lisenbee has had to receive throughout my shift and his continued high CIWA scores. Will continue to monitor.

## 2020-12-23 NOTE — Progress Notes (Signed)
   12/23/20 0025  Assess: MEWS Score  Temp 97.8 F (36.6 C)  BP 128/80  Pulse Rate (!) 114  ECG Heart Rate (!) 114  Resp 20  Level of Consciousness Responds to Voice  SpO2 95 %  O2 Device Room Air  Assess: MEWS Score  MEWS Temp 0  MEWS Systolic 0  MEWS Pulse 2  MEWS RR 0  MEWS LOC 1  MEWS Score 3  MEWS Score Color Yellow  Assess: if the MEWS score is Yellow or Red  Were vital signs taken at a resting state? Yes  Focused Assessment No change from prior assessment  Does the patient meet 2 or more of the SIRS criteria? No  Does the patient have a confirmed or suspected source of infection? Yes (Covid)  Provider and Rapid Response Notified? No  MEWS guidelines implemented *See Row Information* Yes  Take Vital Signs  Increase Vital Sign Frequency  Yellow: Q 2hr X 2 then Q 4hr X 2, if remains yellow, continue Q 4hrs  Escalate  MEWS: Escalate Yellow: discuss with charge nurse/RN and consider discussing with provider and RRT  Notify: Charge Nurse/RN  Name of Charge Nurse/RN Notified Raynelle Fanning, RN  Date Charge Nurse/RN Notified 12/23/20  Time Charge Nurse/RN Notified 0050  Document  Progress note created (see row info) Yes  Assess: SIRS CRITERIA  SIRS Temperature  0  SIRS Pulse 1  SIRS Respirations  0  SIRS WBC 0  SIRS Score Sum  1

## 2020-12-23 NOTE — Progress Notes (Signed)
   12/23/20 0420  Assess: MEWS Score  Temp 97.7 F (36.5 C)  BP (!) 145/93  Pulse Rate (!) 107  ECG Heart Rate (!) 107  Resp 18  SpO2 95 %  O2 Device Room Air  Assess: MEWS Score  MEWS Temp 0  MEWS Systolic 0  MEWS Pulse 1  MEWS RR 0  MEWS LOC 1  MEWS Score 2  MEWS Score Color Yellow  Take Vital Signs  Increase Vital Sign Frequency  Yellow: Q 2hr X 2 then Q 4hr X 2, if remains yellow, continue Q 4hrs (Will continue Q4hr vitals, Raynelle Fanning, RN Charge nurse notified.)  Document  Patient Outcome Other (Comment) (Patient stable, arousable and oriented to self and location (unchanged), CIWA 4 (improved from 14))  Progress note created (see row info) Yes  Assess: SIRS CRITERIA  SIRS Temperature  0  SIRS Pulse 1  SIRS Respirations  0  SIRS WBC 0  SIRS Score Sum  1

## 2020-12-23 NOTE — Progress Notes (Signed)
[facility] PROGRESS [facility]    Chad [name]  WUJ:[id number] DOB: 09-Oct-1950 DOA: 12/21/2020 PCP: [name], Chad A, MD      Brief Narrative:  Chad [name] CootsHarper is [name] 70 y.o. male with hx of EtOH use disorder, major depression, BPH, hypertension, DVT, and acute encephalopathy, who presented to the ED with SI and weakness.   Patient presents to the ED last evening reporting SI and chest pain, after his neighbor had called the police department.  Per ED providers report from last night patient was found in poor living conditions, covered in feces.  Psychiatry was consulted, and ultimately deemed him not [name] risk to self or others and not meeting criteria for psychiatric admission.  After he was cleared by psychiatry he was reevaluated by the ED physician.  He again was incontinent of stool and was barely able to sit up at the side of the bed.  In addition he tested positive for COVID and was deemed unsafe for discharge to home.   On my exam patient seemed disinterested and was [name] difficult historian.  Offered very little in the way of history, but did agree that he had [name] lot of difficulty moving around and felt very weak.  He endorsed thinking daily but was not able to quantify it for me, he denied ever having alcohol withdrawal.  He denied any current chest pain or shortness of breath to me.   In the ED vital signs notable for low normal O2 sats in the low 90s, intermittent tachycardia, and mild hypertension.  As previously noted COVID test returned positive.  Blood alcohol level was 250.  Serial troponins over 12 hours apart were negative.  CBC and CMP were largely unremarkable with exception of mildly elevated AST to 59.  UA was unremarkable.  Urine toxicology screen was negative for all tested substances.  No acute findings on chest x-ray.  EKG however was changed compared to prior with new Q waves in the anterolateral leads.      Assessment & Plan:  COVID Mild, stable, no O2  requirement - Start nirmatrelvir   Alcohol withdrawal Alcohol use disorder CIWA score up to 18 today.  Improved with Ativan, no delirium, confusion. .  Abnormal ECG - Follow up echo   Depression Psychiatry has cleared -Continue Lexapro, trazodone  Empty sella syndrome -Continue HC  PUD -Continue PPI       Disposition: Status is: Inpatient  Remains inpatient appropriate because: he has weakness from COVID, unable to get out of bed, as well as substantial withdrawal symptoms  Dispo: The patient is from: Home              Anticipated d/c is to: Home              Patient currently is not medically stable to d/c.   Difficult to place patient No       Level of care: Med-Surg       MDM: The below labs and imaging reports were reviewed and summarized above.  Medication management as above.   DVT prophylaxis: SCDs Start: 12/22/20 1603  Code Status: FULL Family Communication:           Subjective: Feels "terrible", has general weakness, has some hallucinations, shakes, sweats.  Objective: Vitals:   12/23/20 0025 12/23/20 0224 12/23/20 0420 12/23/20 0844  BP: 128/80 112/68 (!) 145/93 (!) 158/80  Pulse: (!) 114 (!) 107 (!) 107 (!) 105  Resp: 20  18 18   Temp: 97.8 F (  36.6 C) 98 F (36.7 C) 97.7 F (36.5 C) 98.6 F (37 C)  TempSrc: Oral Axillary Oral Oral  SpO2: 95% 95% 95% 96%  Weight:      Height:        Intake/Output Summary (Last 24 hours) at 12/23/2020 1331 Last data filed at 12/23/2020 0400 Gross per 24 hour  Intake 400 ml  Output 400 ml  Net 0 ml   Filed Weights   12/21/20 1947 12/21/20 2006  Weight: 72.6 kg 72.6 kg    Examination: General appearance:  adult male, alert and in moderate distress from withdrawal.   HEENT: Anicteric, conjunctiva pink, lids and lashes normal. No nasal deformity, discharge, epistaxis.  Lips moist, OP dry.   Skin: Warm and dry.  no jaundice.  No suspicious rashes or lesions. Cardiac: RRR, nl S1-S2,  no murmurs appreciated.  Capillary refill is brisk Respiratory: Normal respiratory rate and rhythm.  CTAB without rales or wheezes. Abdomen: Abdomen soft.  no TTP. No ascites, distension, hepatosplenomegaly.   MSK: No deformities or effusions. Neuro: Awake and oriented but appears very sick.  EOMI, moves all extremities. Speech fluent.    Psych: Sensorium intact and responding to questions, attention diminished    Data Reviewed: I have personally reviewed following labs and imaging studies:  CBC: Recent Labs  Lab 12/21/20 1949 12/23/20 0443  WBC 6.7 4.0  HGB 15.3 11.4*  HCT 46.1 33.7*  MCV 93.1 94.9  PLT 324 140*   Basic Metabolic Panel: Recent Labs  Lab 12/21/20 1949 12/23/20 0443  NA 138 138  K 4.3 3.6  CL 101 105  CO2 19* 26  GLUCOSE 106* 112*  BUN 12 6*  CREATININE 0.64 0.65  CALCIUM 8.7* 8.4*  MG  --  1.8  PHOS  --  4.1   GFR: Estimated Creatinine [name]: 85.5 mL/min (by C-G formula based on SCr of 0.65 mg/dL). Liver Function Tests: Recent Labs  Lab 12/21/20 1949 12/23/20 0443  AST 59* 64*  ALT 26 27  ALKPHOS 95 98  BILITOT 0.9 1.1  PROT 6.7 5.5*  ALBUMIN 3.6 2.8*   No results for input(s): LIPASE, AMYLASE in the last 168 hours. No results for input(s): AMMONIA in the last 168 hours. Coagulation Profile: No results for input(s): INR, PROTIME in the last 168 hours. Cardiac Enzymes: No results for input(s): CKTOTAL, CKMB, CKMBINDEX, TROPONINI in the last 168 hours. BNP (last 3 results) No results for input(s): PROBNP in the last 8760 hours. HbA1C: No results for input(s): HGBA1C in the last 72 hours. CBG: No results for input(s): GLUCAP in the last 168 hours. Lipid Profile: No results for input(s): CHOL, HDL, LDLCALC, TRIG, CHOLHDL, LDLDIRECT in the last 72 hours. Thyroid Function Tests: No results for input(s): TSH, T4TOTAL, FREET4, T3FREE, THYROIDAB in the last 72 hours. Anemia Panel: No results for input(s): VITAMINB12, FOLATE, FERRITIN,  TIBC, IRON, RETICCTPCT in the last 72 hours. Urine analysis:    Component Value Date/Time   COLORURINE YELLOW ([name]) 12/21/2020 2142   APPEARANCEUR CLEAR ([name]) 12/21/2020 2142   LABSPEC 1.025 12/21/2020 2142   PHURINE 5.0 12/21/2020 2142   GLUCOSEU NEGATIVE 12/21/2020 2142   HGBUR NEGATIVE 12/21/2020 2142   BILIRUBINUR NEGATIVE 12/21/2020 2142   KETONESUR NEGATIVE 12/21/2020 2142   PROTEINUR NEGATIVE 12/21/2020 2142   NITRITE NEGATIVE 12/21/2020 2142   LEUKOCYTESUR NEGATIVE 12/21/2020 2142   Sepsis Labs: @LABRCNTIP (procalcitonin:4,lacticacidven:4)  ) Recent Results (from the past 240 hour(s))  Resp Panel by RT-PCR (Flu [name]&B, Covid) Nasopharyngeal Swab  Status: Abnormal   Collection Time: 12/21/20  9:42 PM   Specimen: Nasopharyngeal Swab; Nasopharyngeal(NP) swabs in vial transport medium  Result Value Ref Range Status   SARS Coronavirus 2 by RT PCR POSITIVE ([name]) NEGATIVE Final    Comment: RESULT CALLED TO, READ BACK BY AND VERIFIED WITH: BRANDON NORRIS AT 2252 ON 12/21/20 BY SS (NOTE) SARS-CoV-2 target nucleic acids are DETECTED.  The SARS-CoV-2 RNA is generally detectable in upper respiratory specimens during the acute phase of infection. Positive results are indicative of the presence of the identified virus, but do not rule out bacterial infection or co-infection with other pathogens not detected by the test. Clinical correlation with patient history and other diagnostic information is necessary to determine patient infection status. The expected result is Negative.  Fact Sheet for Patients: BloggerCourse.com  Fact Sheet for Healthcare Providers: SeriousBroker.it  This test is not yet approved or cleared by the [place] FDA and  has been authorized for detection and/or diagnosis of SARS-CoV-2 by FDA under an Emergency Use Authorization (EUA).  This EUA will remain in effect (meaning this test c an be used) for the  duration of  the COVID-19 declaration under Section 564(b)(1) of the Act, 21 U.S.C. section 360bbb-3(b)(1), unless the authorization is terminated or revoked sooner.     Influenza [name] by PCR NEGATIVE NEGATIVE Final   Influenza B by PCR NEGATIVE NEGATIVE Final    Comment: (NOTE) The Xpert Xpress SARS-CoV-2/FLU/RSV plus assay is intended as an aid in the diagnosis of influenza from Nasopharyngeal swab specimens and should not be used as [name] sole basis for treatment. Nasal washings and aspirates are unacceptable for Xpert Xpress SARS-CoV-2/FLU/RSV testing.  Fact Sheet for Patients: BloggerCourse.com  Fact Sheet for Healthcare Providers: SeriousBroker.it  This test is not yet approved or cleared by the [place] FDA and has been authorized for detection and/or diagnosis of SARS-CoV-2 by FDA under an Emergency Use Authorization (EUA). This EUA will remain in effect (meaning this test can be used) for the duration of the COVID-19 declaration under Section 564(b)(1) of the Act, 21 U.S.C. section 360bbb-3(b)(1), unless the authorization is terminated or revoked.  Performed at [facility], [address] [place], [place] [address]          Radiology Studies: DG Chest 1 View  Result Date: 12/21/2020 CLINICAL DATA:  Chest pain. EXAM: CHEST  1 VIEW COMPARISON:  08/06/2020 chest CT 02/04/2020 FINDINGS: Low lung volumes. Stable heart size and mediastinal contours. Minor bibasilar atelectasis with overall improved bibasilar aeration from prior exam. No pulmonary edema, pleural effusion, or pneumothorax. No acute osseous abnormalities are seen. IMPRESSION: Low lung volumes with bibasilar atelectasis. Electronically Signed   By: Narda Rutherford M.D.   On: 12/21/2020 21:28        Scheduled Meds:  escitalopram  10 mg Oral Daily   folic acid  1 mg Oral Daily   hydrocortisone  10 mg Oral QPM   hydrocortisone  20 mg Oral Daily    multivitamin with minerals  1 tablet Oral Daily   pantoprazole  40 mg Oral BID   sodium chloride flush  3 mL Intravenous Q12H   thiamine  100 mg Oral Daily   Or   thiamine  100 mg Intravenous Daily   thiamine  100 mg Oral Daily   traZODone  100 mg Oral QHS   Continuous Infusions:   LOS: 1 day    Time spent: 25 minutes    Alberteen Sam, MD [facility] 12/23/2020, 1:31 PM     Please page though AMION or Epic secure chat:  For [employer], Higher education careers adviser

## 2020-12-24 ENCOUNTER — Encounter: Payer: Self-pay | Admitting: Family Medicine

## 2020-12-24 ENCOUNTER — Inpatient Hospital Stay (HOSPITAL_COMMUNITY)
Admit: 2020-12-24 | Discharge: 2020-12-24 | Disposition: A | Discharge status: Home / Self Care | Payer: Medicare HMO | Attending: Family Medicine | Admitting: Family Medicine

## 2020-12-24 DIAGNOSIS — U071 COVID-19: Secondary | ICD-10-CM

## 2020-12-24 DIAGNOSIS — F331 Major depressive disorder, recurrent, moderate: Secondary | ICD-10-CM | POA: Diagnosis not present

## 2020-12-24 DIAGNOSIS — R9431 Abnormal electrocardiogram [ECG] [EKG]: Secondary | ICD-10-CM

## 2020-12-24 LAB — CBC
HCT: 34.3 % — ABNORMAL LOW (ref 39.0–52.0)
Hemoglobin: 11.4 g/dL — ABNORMAL LOW (ref 13.0–17.0)
MCH: 31.2 pg (ref 26.0–34.0)
MCHC: 33.2 g/dL (ref 30.0–36.0)
MCV: 94 fL (ref 80.0–100.0)
Platelets: 128 10*3/uL — ABNORMAL LOW (ref 150–400)
RBC: 3.65 MIL/uL — ABNORMAL LOW (ref 4.22–5.81)
RDW: 15.5 % (ref 11.5–15.5)
WBC: 5.2 10*3/uL (ref 4.0–10.5)
nRBC: 0 % (ref 0.0–0.2)

## 2020-12-24 LAB — ECHOCARDIOGRAM COMPLETE
AR max vel: 3.17 cm2
AV Peak grad: 4 mmHg
Ao pk vel: 1 m/s
Area-P 1/2: 2.79 cm2
Height: 68 in
S' Lateral: 3.3 cm
Weight: 2560 oz

## 2020-12-24 LAB — COMPREHENSIVE METABOLIC PANEL
ALT: 19 U/L (ref 0–44)
AST: 34 U/L (ref 15–41)
Albumin: 2.9 g/dL — ABNORMAL LOW (ref 3.5–5.0)
Alkaline Phosphatase: 88 U/L (ref 38–126)
Anion gap: 5 (ref 5–15)
BUN: 6 mg/dL — ABNORMAL LOW (ref 8–23)
CO2: 27 mmol/L (ref 22–32)
Calcium: 8.4 mg/dL — ABNORMAL LOW (ref 8.9–10.3)
Chloride: 107 mmol/L (ref 98–111)
Creatinine, Ser: 0.54 mg/dL — ABNORMAL LOW (ref 0.61–1.24)
GFR, Estimated: >60 mL/min (ref >60)
Glucose, Bld: 107 mg/dL — ABNORMAL HIGH (ref 70–99)
Potassium: 3.2 mmol/L — ABNORMAL LOW (ref 3.5–5.1)
Sodium: 139 mmol/L (ref 135–145)
Total Bilirubin: 1.1 mg/dL (ref 0.3–1.2)
Total Protein: 5.6 g/dL — ABNORMAL LOW (ref 6.5–8.1)

## 2020-12-24 LAB — C-REACTIVE PROTEIN: CRP: 4.7 mg/dL — ABNORMAL HIGH (ref <1.0)

## 2020-12-24 MED ORDER — POTASSIUM CHLORIDE CRYS ER 20 MEQ PO TBCR
40.0000 meq | EXTENDED_RELEASE_TABLET | Freq: Two times a day (BID) | ORAL | Status: AC
  Administered 2020-12-24 (×2): 40 meq via ORAL
  Filled 2020-12-24 (×2): qty 2

## 2020-12-24 NOTE — Plan of Care (Signed)
Pt is alert to self. Follow simple commands. Pt was started on Haldol. Haldol effective in decreasing agitation. Pt received 1 dose of haldol and 2 doses of ativan this shift.  Problem: Education: Goal: Knowledge of General Education information will improve Description: Including pain rating scale, medication(s)/side effects and non-pharmacologic comfort measures Outcome: Progressing   Problem: Health Behavior/Discharge Planning: Goal: Ability to manage health-related needs will improve Outcome: Progressing   Problem: Clinical Measurements: Goal: Ability to maintain clinical measurements within normal limits will improve Outcome: Progressing Goal: Will remain free from infection Outcome: Progressing Goal: Diagnostic test results will improve Outcome: Progressing Goal: Respiratory complications will improve Outcome: Progressing Goal: Cardiovascular complication will be avoided Outcome: Progressing   Problem: Activity: Goal: Risk for activity intolerance will decrease Outcome: Progressing   Problem: Nutrition: Goal: Adequate nutrition will be maintained Outcome: Progressing   Problem: Coping: Goal: Level of anxiety will decrease Outcome: Progressing   Problem: Elimination: Goal: Will not experience complications related to bowel motility Outcome: Progressing Goal: Will not experience complications related to urinary retention Outcome: Progressing   Problem: Pain Managment: Goal: General experience of comfort will improve Outcome: Progressing   Problem: Safety: Goal: Ability to remain free from injury will improve Outcome: Progressing   Problem: Skin Integrity: Goal: Risk for impaired skin integrity will decrease Outcome: Progressing   Problem: Education: Goal: Knowledge of risk factors and measures for prevention of condition will improve Outcome: Progressing   Problem: Coping: Goal: Psychosocial and spiritual needs will be supported Outcome: Progressing    Problem: Respiratory: Goal: Will maintain a patent airway Outcome: Progressing Goal: Complications related to the disease process, condition or treatment will be avoided or minimized Outcome: Progressing
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
## 2020-12-24 NOTE — Progress Notes (Signed)
*  PRELIMINARY RESULTS* Echocardiogram 2D Echocardiogram has been performed.  Chad [name] 12/24/2020, 12:04 PM

## 2020-12-24 NOTE — Evaluation (Signed)
Physical Therapy Evaluation Patient Details Name: Chad [name] MRN: [id number] DOB: [birth year] Today's Date: 12/24/2020   History of Present Illness  Chad [name] is a 70yoM who [facility]/21/20 via [name] [employer]. Police contacted after neighbors found patient to not be managing his exrement in an appropriate manner. Pt found to have (+) COVID test. Psych is following. PMH: ETOH abuse, MDD, BPH, HTN, DVT, acute encephalopathy.  Clinical Impression  Pt admitted with above diagnosis. Pt currently with functional limitations due to the deficits listed below (see "PT Problem List"). Upon entry, pt in bed, maybe asleep, maybe awake, difficult to tell. He is interactive after knock and greeting,  agreeable to participate, but he remains hypoactive, flat in affect, hypophonic and delayed in response. The pt minimally interactive, provides some basic info regarding prior level of function, but it is difficult to arouse considerable detail. The pt reports to feel quite awful, but does not elaborate much. He says his chest hurts (without further elaboration) and says he just 'wants to die.' Author asks if patient feels hopeless, to which he offers no response. Pt agreeable to mobility in room. ModA to/from EOB due to severe weakness. Pt c/o lightheadedness at EOB x1 minute. Pt able to stand with RW and near maximal effort, no LOB. Pt cued for different stepping patterns in room, given verbal + gestural cues with poor accuracy. Pt appears to tolerate ~2 minutes of standing activity fairly well, but once seated at EOB, fatigues quickly with posterior collapse. Additional mobility deferred at this time due to cognitive impairment limiting safe participation with more risky activity. VSS stable (HR, SPO2) during session, both monitored on tele unit. Patient's performance this date reveals decreased ability, independence, and tolerance in performing all basic mobility required for performance of activities of daily  living. Pt requires additional DME, close physical assistance, and cues for safe participate in mobility. Pt will benefit from skilled PT intervention to increase independence and safety with basic mobility in preparation for discharge to the venue listed below.       Follow Up Recommendations SNF;Supervision for mobility/OOB;Supervision - Intermittent    Equipment Recommendations  None recommended by PT    Recommendations for Other Services       Precautions / Restrictions Precautions Precautions: Fall Restrictions Weight Bearing Restrictions: No      Mobility  Bed Mobility Overal bed mobility: Needs Assistance Bed Mobility: Supine to Sit;Sit to Supine     Supine to sit: Mod assist Sit to supine: Mod assist   General bed mobility comments: very weak despite fervent attempts    Transfers Overall transfer level: Needs assistance Equipment used: Rolling walker (2 wheeled) Transfers: Sit to/from Stand Sit to Stand: Min assist         General transfer comment: light minA, contorted posturing demonstrative of heavy effort  Ambulation/Gait Ambulation/Gait assistance: Min guard Gait Distance (Feet): 6 Feet Assistive device: Rolling walker (2 wheeled)       General Gait Details: forward flexed and kyphotic; despite posturing able to maintain balance well with RW; struggles to follow simple commands for stepping at bedside, but does not lose balance.  Stairs            Wheelchair Mobility    Modified Rankin (Stroke Patients Only)       Balance Overall balance assessment: Needs assistance Sitting-balance support: Feet supported;No upper extremity supported Sitting balance-Leahy Scale: Fair Sitting balance - Comments: needs time and modA to obtain neutral sitting posture, but fatigues quickly,  collapses backward after 2 minutes sitting EOB.                                     Pertinent Vitals/Pain Pain Assessment: Faces Faces Pain Scale:  Hurts little more Pain Location: central chest pain Pain Descriptors / Indicators:  (does not respond to questioning) Pain Intervention(s): Monitored during session    Home Living Family/patient expects to be discharged to:: Private residence Living Arrangements: Alone Available Help at Discharge: Friend(s);Available PRN/intermittently;Neighbor Type of Home: Apartment Home Access: Stairs to enter Entrance Stairs-Rails: Can reach both Entrance Stairs-Number of Steps: Full flight, 13 stairs Home Layout: One level Home Equipment: Walker - 2 wheels;Cane - single point;Shower seat - built in Additional Comments: Some home set-up information obtained from previous addition d/t pt unable to provide all home set-up information    Prior Function Level of Independence: Needs assistance   Gait / Transfers Assistance Needed: Pt reports using a RW for functional mobility. Reports "countless falls" over past 6 months; unable to speak to difficulty on stairs, says he's become somewhat of a hermit  ADL's / Homemaking Assistance Needed: Independent at baseline, lives alone.        Hand Dominance   Dominant Hand: Right    Extremity/Trunk Assessment   Upper Extremity Assessment Upper Extremity Assessment: Generalized weakness;Overall [facility] for tasks assessed    Lower Extremity Assessment Lower Extremity Assessment: Generalized weakness;Overall [facility] for tasks assessed    Cervical / Trunk Assessment Cervical / Trunk Assessment: Kyphotic  Communication      Cognition Arousal/Alertness:  (drowsy) Behavior During Therapy: Flat affect;Agitated Overall Cognitive Status: No family/caregiver present to determine baseline cognitive functioning                                 General Comments: Yelling out for help. Pt oriented to self, place, and year      General Comments      Exercises     Assessment/Plan    PT Assessment Patient needs continued PT services  PT Problem  List Decreased strength;Decreased activity tolerance;Decreased balance;Decreased mobility       PT Treatment Interventions Balance training;DME instruction;Gait training;Stair training;Functional mobility training;Therapeutic activities;Cognitive remediation;Neuromuscular re-education;Therapeutic exercise;Patient/family education    PT Goals (Current goals can be found in the Care Plan section)  Acute Rehab PT Goals Patient Stated Goal: feel better in general; pt wants to die at this point PT Goal Formulation: With patient Time For Goal Achievement: 01/07/21 Potential to Achieve Goals: Fair    Frequency Min 2X/week   Barriers to discharge Decreased caregiver support;Inaccessible home environment      Co-evaluation               AM-PAC PT "6 Clicks" Mobility  Outcome Measure Help needed turning from your back to your side while in a flat bed without using bedrails?: A Lot Help needed moving from lying on your back to sitting on the side of a flat bed without using bedrails?: A Lot Help needed moving to and from a bed to a chair (including a wheelchair)?: A Little Help needed standing up from a chair using your arms (e.g., wheelchair or bedside chair)?: A Little Help needed to walk in hospital room?: A Lot Help needed climbing 3-5 steps with a railing? : Total 6 Click Score: 13    End of Session  Activity Tolerance: Patient tolerated treatment well;Patient limited by fatigue;Patient limited by lethargy Patient left: in bed;with nursing/sitter in room;with call bell/phone within reach;with bed alarm set (psych in room)   PT Visit Diagnosis: Difficulty in walking, not elsewhere classified (R26.2);Other abnormalities of gait and mobility (R26.89);Repeated falls (R29.6);Muscle weakness (generalized) (M62.81);Other symptoms and signs involving the nervous system (R29.898);Dizziness and giddiness (R42)    Time: 4128-7867 PT Time Calculation (min) (ACUTE ONLY): 15  min   Charges:   PT Evaluation $PT Eval Moderate Complexity: 1 Mod        4:42 PM, 12/24/20 Rosamaria Lints, PT, DPT Physical Therapist - [facility]  [phone] (ASCOM)    Bernadine Melecio C 12/24/2020, 4:34 PM

## 2020-12-24 NOTE — Progress Notes (Signed)
Initial Nutrition Assessment  DOCUMENTATION CODES:   Not applicable  INTERVENTION:   - Ensure Enlive po BID, each supplement provides 350 kcal and 20 grams of protein  - Magic Cup TID with meals, each supplement provides 290 kcal and 9 grams of protein  - MVI with minerals daily  - Liberalize diet to Regular, verbal with readback order placed per MD  - Encourage PO intake  - Please obtain measured weight  NUTRITION DIAGNOSIS:   Inadequate oral intake related to lethargy/confusion as evidenced by meal completion < 25%.  GOAL:   Patient will meet greater than or equal to 90% of their needs  MONITOR:   PO intake, Supplement acceptance, Labs, Weight trends, I & O's  REASON FOR ASSESSMENT:   Malnutrition Screening Tool    ASSESSMENT:   70 year old male who presented to the ED with [name] [employer] on 9/02 as voluntary psych evaluation and with chest pain and nausea. PMH of anxiety, MDD, BPH, DVT, CKD, GERD, HTN, EtOH abuse. Pt tested positive for COVID-19.  RD working remotely. Unable to reach pt via phone call to room.  Noted pt with meal completions of 10-15%. Suspect minimal PO intake at meals related to agitation and sedating medications that have been provided per review of notes. RD to order oral nutrition supplements to aid pt in meeting kcal and protein needs. Recommend liberalizing diet to Regular; discussed with MD who agreed. Verbal with readback order placed for a Regular diet.  Reviewed weight history in chart. Current weight of 72.6 kg is up from weight of 65.8 kg in June 2022. However, weight on admission appears to be stated rather than measured. Recommend obtaining measured weight during this admission so that RD can better assess pt's weight trends.  PTA, pt's weight had been declining from 73.9 kg on 05/01/20 to 68.7 kg on 08/14/20 to 65/8 kg on 09/27/20. Pt is at risk for malnutrition and may already meet criteria but RD unable to confirm without updated weight and  NFPE.  Meal Completion: 10-15%  Medications reviewed and include: folic acid, MVI with minerals, protonix, klor-con, thiamine  Labs reviewed: potassium 3.2  UOP: 1600 ml x 24 hours I/O's: -757 ml since admit  NUTRITION - FOCUSED PHYSICAL EXAM:  Unable to complete at this time. RD working remotely.  Diet Order:   Diet Order             Diet Heart Room service appropriate? Yes; Fluid consistency: Thin  Diet effective now                   EDUCATION NEEDS:   Not appropriate for education at this time  Skin:  Skin Assessment: Reviewed RN Assessment  Last BM:  12/23/20  Height:   Ht Readings from Last 1 Encounters:  12/21/20 5\' 8"  (1.727 m)    Weight:   Wt Readings from Last 1 Encounters:  12/21/20 72.6 kg    BMI:  Body mass index is 24.33 kg/m.  Estimated Nutritional Needs:   Kcal:  1900-2100  Protein:  90-110 grams  Fluid:  >/= 1.8 L    02/20/21, MS, RD, LDN Inpatient Clinical Dietitian Please see AMiON for contact information.

## 2020-12-25 LAB — COMPREHENSIVE METABOLIC PANEL
ALT: 18 U/L (ref 0–44)
AST: 24 U/L (ref 15–41)
Albumin: 2.8 g/dL — ABNORMAL LOW (ref 3.5–5.0)
Alkaline Phosphatase: 79 U/L (ref 38–126)
Anion gap: 6 (ref 5–15)
BUN: 11 mg/dL (ref 8–23)
CO2: 25 mmol/L (ref 22–32)
Calcium: 8.6 mg/dL — ABNORMAL LOW (ref 8.9–10.3)
Chloride: 108 mmol/L (ref 98–111)
Creatinine, Ser: 0.52 mg/dL — ABNORMAL LOW (ref 0.61–1.24)
GFR, Estimated: >60 mL/min (ref >60)
Glucose, Bld: 112 mg/dL — ABNORMAL HIGH (ref 70–99)
Potassium: 4.4 mmol/L (ref 3.5–5.1)
Sodium: 139 mmol/L (ref 135–145)
Total Bilirubin: 0.5 mg/dL (ref 0.3–1.2)
Total Protein: 5.4 g/dL — ABNORMAL LOW (ref 6.5–8.1)

## 2020-12-25 LAB — C-REACTIVE PROTEIN: CRP: 2.7 mg/dL — ABNORMAL HIGH (ref <1.0)

## 2020-12-25 MED ORDER — NICOTINE 21 MG/24HR TD PT24
21.0000 mg | MEDICATED_PATCH | Freq: Every day | TRANSDERMAL | Status: DC
  Administered 2020-12-25 – 2021-01-04 (×11): 21 mg via TRANSDERMAL
  Filled 2020-12-25 (×11): qty 1

## 2020-12-25 MED ORDER — LORAZEPAM 2 MG/ML IJ SOLN: 1.0000 mg | INTRAMUSCULAR | Status: DC | PRN

## 2020-12-25 MED ORDER — LORAZEPAM 1 MG PO TABS
1.0000 mg | ORAL_TABLET | ORAL | Status: DC | PRN
  Administered 2020-12-26: 2 mg via ORAL
  Administered 2020-12-27: 1 mg via ORAL
  Filled 2020-12-25: qty 2
  Filled 2020-12-25: qty 1

## 2020-12-25 MED ORDER — GABAPENTIN 300 MG PO CAPS
300.0000 mg | ORAL_CAPSULE | Freq: Three times a day (TID) | ORAL | Status: DC
  Administered 2020-12-25 – 2021-01-04 (×32): 300 mg via ORAL
  Filled 2020-12-25 (×32): qty 1

## 2020-12-25 NOTE — TOC Initial Note (Signed)
Transition of Care [facility]) - Initial/Assessment Note    Patient Details  Name: Nihar Klus MRN: [id number] Date of Birth: 01/07/1951  Transition of Care [facility]) CM/SW Contact:    Margarito Liner, LCSW Phone Number: 12/25/2020, 1:47 PM  Clinical Narrative: Patient on COVID isolation precautions. CSW called patient in the room, introduced role, and explained that PT recommendations would be discussed. Patient is agreeable to SNF placement. He went to [facility] in June for two weeks. Patient reports it was a good experience and he would like to return there. Admissions coordinator is aware and will review. Patient will have to wait to go to SNF until he has finish quarantine period. PASARR under manual review. Uploaded requested documents into  Must for PASARR review. No further concerns. CSW encouraged patient to contact CSW as needed. CSW will continue to follow patient for support and facilitate discharge to SNF once medically stable.                 Expected Discharge Plan: Skilled Nursing Facility Barriers to Discharge: Other (must enter comment) (COVID Isolation)   Patient Goals and CMS Choice        Expected Discharge Plan and Services Expected Discharge Plan: Skilled Nursing Facility     Post Acute Care Choice: Skilled Nursing Facility Living arrangements for the past 2 months: Single Family Home                                      Prior Living Arrangements/Services Living arrangements for the past 2 months: Single Family Home Lives with:: Self Patient language and need for interpreter reviewed:: Yes Do you feel safe going back to the place where you live?: Yes      Need for Family Participation in Patient Care: Yes (Comment) Care giver support system in place?: Yes (comment)   Criminal Activity/Legal Involvement Pertinent to Current Situation/Hospitalization: No - Comment as needed  Activities of Daily Living Home Assistive Devices/Equipment: None ADL  Screening (condition at time of admission) Patient's cognitive ability adequate to safely complete daily activities?: No Is the patient deaf or have difficulty hearing?: No Does the patient have difficulty seeing, even when wearing glasses/contacts?: No Does the patient have difficulty concentrating, remembering, or making decisions?: Yes Patient able to express need for assistance with ADLs?: Yes Does the patient have difficulty dressing or bathing?: Yes Independently performs ADLs?: No Communication: Independent Dressing (OT): Needs assistance Is this a change from baseline?: Pre-admission baseline Grooming: Needs assistance Is this a change from baseline?: Pre-admission baseline Feeding: Needs assistance Is this a change from baseline?: Pre-admission baseline Bathing: Needs assistance Is this a change from baseline?: Pre-admission baseline Toileting: Needs assistance Is this a change from baseline?: Pre-admission baseline In/Out Bed: Needs assistance Is this a change from baseline?: Pre-admission baseline Walks in Home: Needs assistance Is this a change from baseline?: Pre-admission baseline Does the patient have difficulty walking or climbing stairs?: Yes Weakness of Legs: Right Weakness of Arms/Hands: Left  Permission Sought/Granted Permission sought to share information with : Facility Industrial/product designer granted to share information with : Yes, Verbal Permission Granted     Permission granted to share info w AGENCY: SNF's        Emotional Assessment Appearance:: Appears stated age Attitude/Demeanor/Rapport: Engaged, Gracious Affect (typically observed): Accepting, Appropriate, Calm, Pleasant Orientation: : Oriented to Self, Oriented to Place, Oriented to  Time, Oriented to Situation  Alcohol / Substance Use: Alcohol Use Psych Involvement: Yes (comment)  Admission diagnosis:  Weakness [R53.1] Physical deconditioning [R53.81] Alcohol dependence with  uncomplicated withdrawal (HCC) [F10.230] Chest pain [R07.9] COVID-19 [U07.1] Patient Active Problem List   Diagnosis Date Noted   COVID-19 12/24/2020   Physical deconditioning 12/22/2020   Nicotine dependence 09/27/2020   Alcohol abuse with intoxication delirium (HCC)    Chest pain    Alcohol dependence with uncomplicated withdrawal (HCC) 01/23/2019   Alcohol-induced mood disorder (HCC) 01/23/2019   Wernicke's encephalopathy 10/18/2018   Acute alcoholic liver disease 10/16/2018   Severe protein-calorie malnutrition (HCC) 10/16/2018   HTN (hypertension) 05/17/2018   Alcohol withdrawal (HCC) 05/17/2018   DVT (deep venous thrombosis) (HCC) 05/17/2018   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   Primary osteoarthritis of both hands 12/29/2017   Psoriasis 12/29/2017   Hepatitis C antibody test positive 10/31/2013   BPH (benign prostatic hyperplasia) 12/15/2011   ED (erectile dysfunction) 12/12/2011   PCP:  Rayetta Humphrey, MD Pharmacy:   [facility] [phone] - [place], [place] - [address] [place] [address] Phone: [phone] Fax: [phone]  [id number] [name], [place] - [address] AT [facility] OF [address] [facility] [address] [place] [address] Phone: [phone] Fax: [phone]     Social Determinants of Health (SDOH) Interventions    Readmission Risk Interventions No flowsheet data found.

## 2020-12-25 NOTE — [place] FL2 (Signed)
[place] MEDICAID FL2 LEVEL OF CARE SCREENING TOOL     IDENTIFICATION  Patient Name: Chad [name] Birthdate: Sep 08, 1950 Sex: male Admission Date (Current Location): 12/21/2020  [facility] and [place] Number:  Chiropodist and Address:  [facility], [address] [place], [place] [address]      Provider Number: [id number]  Attending Physician Name and Address:  Alberteen Sam, *  Relative Name and Phone Number:       Current Level of Care: Hospital Recommended Level of Care: Skilled Nursing Facility Prior Approval Number:    Date Approved/Denied:   PASRR Number: Manual review  Discharge Plan: SNF    Current Diagnoses: Patient Active Problem List   Diagnosis Date Noted   COVID-19 12/24/2020   Physical deconditioning 12/22/2020   Nicotine dependence 09/27/2020   Alcohol abuse with intoxication delirium (HCC)    Chest pain    Alcohol dependence with uncomplicated withdrawal (HCC) 01/23/2019   Alcohol-induced mood disorder (HCC) 01/23/2019   Wernicke's encephalopathy 10/18/2018   Acute alcoholic liver disease 10/16/2018   Severe protein-calorie malnutrition (HCC) 10/16/2018   HTN (hypertension) 05/17/2018   Alcohol withdrawal (HCC) 05/17/2018   DVT (deep venous thrombosis) (HCC) 05/17/2018   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   Primary osteoarthritis of both hands 12/29/2017   Psoriasis 12/29/2017   Hepatitis C antibody test positive 10/31/2013   BPH (benign prostatic hyperplasia) 12/15/2011   ED (erectile dysfunction) 12/12/2011    Orientation RESPIRATION BLADDER Height & Weight     Self, Time, Situation, Place  Normal Continent, External catheter Weight: 160 lb (72.6 kg) Height:  5\' 8"  (172.7 cm)  BEHAVIORAL SYMPTOMS/MOOD NEUROLOGICAL BOWEL NUTRITION STATUS   (None)  (None) Incontinent Diet (Regular)  AMBULATORY STATUS COMMUNICATION OF NEEDS Skin   Limited Assist Verbally Other (Comment)  (Excoriated.)                       Personal Care Assistance Level of Assistance  Bathing, Feeding, Dressing Bathing Assistance: Limited assistance Feeding assistance: Limited assistance Dressing Assistance: Limited assistance     Functional Limitations Info  Sight, Hearing, Speech Sight Info: Adequate Hearing Info: Adequate Speech Info: Adequate    SPECIAL CARE FACTORS FREQUENCY  PT (By licensed PT), OT (By licensed OT)     PT Frequency: 5 x week OT Frequency: 5 x week            Contractures Contractures Info: Not present    Additional Factors Info  Code Status, Allergies, Psychotropic Code Status Info: Full code Allergies Info: Morphine and related, Ibuprofen Psychotropic Info: Depression         Current Medications (12/25/2020):  This is the current hospital active medication list Current Facility-Administered Medications  Medication Dose Route Frequency Provider Last Rate Last Admin   acetaminophen (TYLENOL) tablet 650 mg  650 mg Oral Q6H PRN 02/24/2021, MD   650 mg at 12/25/20 0123   Or   acetaminophen (TYLENOL) suppository 650 mg  650 mg Rectal Q6H PRN 02/24/21, MD       chlorpheniramine-HYDROcodone (TUSSIONEX) 10-8 MG/5ML suspension 5 mL  5 mL Oral Q12H PRN Venora Maples, MD       escitalopram (LEXAPRO) tablet 10 mg  10 mg Oral Daily Alberteen Sam, MD   10 mg at 12/25/20 0818   folic acid (FOLVITE) tablet 1 mg  1 mg Oral Daily 02/24/21, MD   1 mg at 12/25/20 [phone]  gabapentin (NEURONTIN) capsule 300 mg  300 mg Oral TID Alberteen Sam, MD   300 mg at 12/25/20 1309   haloperidol lactate (HALDOL) injection 5 mg  5 mg Intravenous Q6H PRN Alberteen Sam, MD   5 mg at 12/23/20 2023   hydrocortisone (CORTEF) tablet 10 mg  10 mg Oral QPM Venora Maples, MD   10 mg at 12/24/20 1741   hydrocortisone (CORTEF) tablet 20 mg  20 mg Oral Daily Venora Maples, MD   20 mg at 12/25/20 0819   LORazepam  (ATIVAN) tablet 1-4 mg  1-4 mg Oral Q1H PRN Venora Maples, MD   2 mg at 12/25/20 0818   Or   LORazepam (ATIVAN) injection 1-4 mg  1-4 mg Intravenous Q1H PRN Venora Maples, MD   3 mg at 12/23/20 1818   multivitamin with minerals tablet 1 tablet  1 tablet Oral Daily Venora Maples, MD   1 tablet at 12/25/20 7681   nirmatrelvir/ritonavir EUA (PAXLOVID) 3 tablet  3 tablet Oral BID Alberteen Sam, MD   3 tablet at 12/25/20 0821   ondansetron (ZOFRAN) tablet 4 mg  4 mg Oral Q6H PRN Venora Maples, MD       Or   ondansetron [facility]) injection 4 mg  4 mg Intravenous Q6H PRN Venora Maples, MD       pantoprazole (PROTONIX) EC tablet 40 mg  40 mg Oral BID Venora Maples, MD   40 mg at 12/25/20 0818   polyethylene glycol (MIRALAX / GLYCOLAX) packet 17 g  17 g Oral Daily PRN Venora Maples, MD       sodium chloride flush (NS) 0.9 % injection 3 mL  3 mL Intravenous Q12H Venora Maples, MD   3 mL at 12/25/20 0820   thiamine tablet 100 mg  100 mg Oral Daily Venora Maples, MD   100 mg at 12/21/20 2202   Or   thiamine (B-1) injection 100 mg  100 mg Intravenous Daily Venora Maples, MD   100 mg at 12/25/20 1572   thiamine tablet 100 mg  100 mg Oral Daily Venora Maples, MD   100 mg at 12/25/20 0818   traZODone (DESYREL) tablet 100 mg  100 mg Oral QHS Briscoe Deutscher, MD   100 mg at 12/24/20 2038     Discharge Medications: Please see discharge summary for a list of discharge medications.  Relevant Imaging Results:  Relevant Lab Results:   Additional Information SS#: [id number]. Tested positive for COVID on 9/2. Vaccine: Moderna 09/28/20.  Margarito Liner, LCSW

## 2020-12-25 NOTE — Progress Notes (Signed)
[facility] PROGRESS NOTE    Chad [name]  WUJ:[id number] DOB: 1950-10-07 DOA: 12/21/2020 PCP: Rayetta Humphrey, MD      Brief Narrative:  Chad [name] is a 70 y.o. M with AUD, HTN, and [facility], who presented with suicidal ideation and generalized weakness.    The day before this admission, the patient had presented to the hospital with suicidal ideation chest pain after a neighbor called the police.  Per report, the patient appeared to be covered in feces, when she had poor living conditions.  Psychiatry were consulted, recommended psychiatric treatment but no psychiatric admission that he was not admitted at rest himself, cleared and not eligible for inpatient psychiatric treatment.  As the patient was unable to stand, and appeared incoherent, hospitalists are asked to evaluate.  He subsequently tested positive for COVID.        Assessment & Plan:  COVID Mild, stable, no O2 requirement - Continue Paxlovid, day 4 of 5   Alcohol withdrawal without delirium Alcohol use disorder CIWA scores initially 15-20, but improving in last 24 hours, only 5-9.  No confusion, no seizures.  - Start gabapentin 300 TID for alcohol withdrawal, plan to continue at dischage for 2 months then taper off - TOC consult for SUD treatment resources - Continue CIWA scoring with on demand lorazepam - Continue thiamine and folate    Depression Suicidal ideation Seen by Psychiatry who feel he does not meet criteria for inaptietn psychiatric treatment, they recommend outpatietn follow up - Continue lexapro, trazodone  Empty sella syndrome - Continue hydrocortisone  Peptic ulcer disease History of bleeding ulcuers -Continue PPI   Hypokalemia Treated and resolved  Abnormal ECG New T wave invesions seen on ECG.  Had some chest discomfort but trops here normal.  Chest pain resolved.  Echo obtained and showed normal EF, grade I DD, normal valves, no effusion, no RWMA.   Likely pleuritic pain.     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           Disposition: Status is: Inpatient  Remains inpatient appropriate because: he has weakness from COVID, unable to get out of bed, as well as substantial withdrawal symptoms  Dispo: The patient is from: Home              Anticipated d/c is to: Home              Patient currently is not medically stable to d/c.   Difficult to place patient No  Level of care: Med-Surg    Patient admitted with COVID and alcohol withdrawal.  Likely ready for discharge tomorrow from medical standpoint, but Rehab feel he requires SNF level rehab.  Will seek placement.  May improve and be able to go home prior to securing SNF placement.       MDM: The below labs and imaging reports were reviewed and summarized above.  Medication management as above.   DVT prophylaxis: SCDs Start: 12/22/20 1603  Code Status: FULL Family Communication: Niece by phone          Subjective: Feels "horrible", generalized weakness, aches, malaise.  Mild cough, some pleuritic chest pain.  No confusion, seizures, sweating, headache.  Objective: Vitals:   12/25/20 0604 12/25/20 0804 12/25/20 1400 12/25/20 1511  BP: 139/83 133/68 133/68 127/69  Pulse: (!) 54 (!) 59 60 68  Resp: Temp: 97.7 F (36.5 C) 97.7 F (36.5 C)  97.8 F (36.6 C)  TempSrc:  Oral Oral  Oral  SpO2: 96% 96%  95%  Weight:      Height:        Intake/Output Summary (Last 24 hours) at 12/25/2020 1643 Last data filed at 12/25/2020 1625 Gross per 24 hour  Intake --  Output 950 ml  Net -950 ml   Filed Weights   12/21/20 1947 12/21/20 2006  Weight: 72.6 kg 72.6 kg    Examination: General appearance: Elderly adult male, appears disheveled, appears sick     HEENT: No nasal deformity, discharge, or epistaxis.  Sclera anicteric, conjunctival pink, lids and lashes normal.  Oropharynx tacky dry, no oral lesions Skin:  Cardiac: RRR, no  murmurs, no lower extremity edema Respiratory: Normal respiratory rate and rhythm, lungs clear without rales or wheezes Abdomen: Abdomen diffusely tender but no focal tenderness, no guarding or rigidity, no ascites or distention MSK:  Neuro: Awake and alert but appears very tired and sick, face symmetric, moves all extremities with severe generalized weakness, speech fluent Psych: Responds to questions, attention distracted, affect blunted, judgment and insight appear normal      Data Reviewed: I have personally reviewed following labs and imaging studies:  CBC: Recent Labs  Lab 12/21/20 1949 12/23/20 0443 12/24/20 0500  WBC 6.7 4.0 5.2  HGB 15.3 11.4* 11.4*  HCT 46.1 33.7* 34.3*  MCV 93.1 94.9 94.0  PLT 324 140* 128*   Basic Metabolic Panel: Recent Labs  Lab 12/21/20 1949 12/23/20 0443 12/24/20 0500 12/25/20 0552  NA 138 138 139 139  K 4.3 3.6 3.2* 4.4  CL 101 105 107 108  CO2 19* 26 27 25   GLUCOSE 106* 112* 107* 112*  BUN 12 6* 6* 11  CREATININE 0.64 0.65 0.54* 0.52*  CALCIUM 8.7* 8.4* 8.4* 8.6*  MG  --  1.8  --   --   PHOS  --  4.1  --   --    GFR: Estimated Creatinine Clearance: 85.5 mL/min (A) (by C-G formula based on SCr of 0.52 mg/dL (L)). Liver Function Tests: Recent Labs  Lab 12/21/20 1949 12/23/20 0443 12/24/20 0500 12/25/20 0552  AST 59* 64* 34 24  ALT 26 27 19 18   ALKPHOS 95 98 88 79  BILITOT 0.9 1.1 1.1 0.5  PROT 6.7 5.5* 5.6* 5.4*  ALBUMIN 3.6 2.8* 2.9* 2.8*   No results for input(s): LIPASE, AMYLASE in the last 168 hours. No results for input(s): AMMONIA in the last 168 hours. Coagulation Profile: No results for input(s): INR, PROTIME in the last 168 hours. Cardiac Enzymes: No results for input(s): CKTOTAL, CKMB, CKMBINDEX, TROPONINI in the last 168 hours. BNP (last 3 results) No results for input(s): PROBNP in the last 8760 hours. HbA1C: No results for input(s): HGBA1C in the last 72 hours. CBG: No results for input(s): GLUCAP in  the last 168 hours. Lipid Profile: No results for input(s): CHOL, HDL, LDLCALC, TRIG, CHOLHDL, LDLDIRECT in the last 72 hours. Thyroid Function Tests: No results for input(s): TSH, T4TOTAL, FREET4, T3FREE, THYROIDAB in the last 72 hours. Anemia Panel: No results for input(s): VITAMINB12, FOLATE, FERRITIN, TIBC, IRON, RETICCTPCT in the last 72 hours. Urine analysis:    Component Value Date/Time   COLORURINE YELLOW (A) 12/21/2020 2142   APPEARANCEUR CLEAR (A) 12/21/2020 2142   LABSPEC 1.025 12/21/2020 2142   PHURINE 5.0 12/21/2020 2142   GLUCOSEU NEGATIVE 12/21/2020 2142   HGBUR NEGATIVE 12/21/2020 2142   BILIRUBINUR NEGATIVE 12/21/2020 2142   KETONESUR NEGATIVE 12/21/2020 2142   PROTEINUR NEGATIVE 12/21/2020 2142  NITRITE NEGATIVE 12/21/2020 2142   LEUKOCYTESUR NEGATIVE 12/21/2020 2142   Sepsis Labs: @LABRCNTIP (procalcitonin:4,lacticacidven:4)  ) Recent Results (from the past 240 hour(s))  Resp Panel by RT-PCR (Flu A&B, Covid) Nasopharyngeal Swab     Status: Abnormal   Collection Time: 12/21/20  9:42 PM   Specimen: Nasopharyngeal Swab; Nasopharyngeal(NP) swabs in vial transport medium  Result Value Ref Range Status   SARS Coronavirus 2 by RT PCR POSITIVE (A) NEGATIVE Final    Comment: RESULT CALLED TO, READ BACK BY AND VERIFIED WITH: BRANDON NORRIS AT 2252 ON 12/21/20 BY SS (NOTE) SARS-CoV-2 target nucleic acids are DETECTED.  The SARS-CoV-2 RNA is generally detectable in upper respiratory specimens during the acute phase of infection. Positive results are indicative of the presence of the identified virus, but do not rule out bacterial infection or co-infection with other pathogens not detected by the test. Clinical correlation with patient history and other diagnostic information is necessary to determine patient infection status. The expected result is Negative.  Fact Sheet for Patients: 02/20/21  Fact Sheet for Healthcare  Providers: BloggerCourse.com  This test is not yet approved or cleared by the [employer]ious[employer].[place] FDA and  has been authorized for detection and/or diagnosis of SARS-CoV-2 by FDA under an Emergency Use Authorization (EUA).  This EUA will remain in effect (meaning this test c an be used) for the duration of  the COVID-19 declaration under Section 564(b)(1) of the Act, 21 U.S.C. section 360bbb-3(b)(1), unless the authorization is terminated or revoked sooner.     Influenza A by PCR NEGATIVE NEGATIVE Final   Influenza B by PCR NEGATIVE NEGATIVE Final    Comment: (NOTE) The Xpert Xpress SARS-CoV-2/FLU/RSV plus assay is intended as an aid in the diagnosis of influenza from Nasopharyngeal swab specimens and should not be used as a sole basis for treatment. Nasal washings and aspirates are unacceptable for Xpert Xpress SARS-CoV-2/FLU/RSV testing.  Fact Sheet for Patients: [place]  Fact Sheet for Healthcare Providers: BloggerCourse.com  This test is not yet approved or cleared by the [employer]ious[employer]ker.[place] FDA and has been authorized for detection and/or diagnosis of SARS-CoV-2 by FDA under an Emergency Use Authorization (EUA). This EUA will remain in effect (meaning this test can be used) for the duration of the COVID-19 declaration under Section 564(b)(1) of the Act, 21 U.S.C. section 360bbb-3(b)(1), unless the authorization is terminated or revoked.  Performed at [facility], [address], [place], [place]          Radiology Studies: ECHOCARDIOGRAM COMPLETE  Result Date: 12/24/2020    ECHOCARDIOGRAM REPORT   Patient Name:   Chad [name] Date of Exam: 12/24/2020 Medical Rec #:  02/23/2021      Height:       68.0 in Accession #:    [id number]     Weight:       160.0 lb Date of Birth:  1950-12-25      BSA:          1.859 m Patient Age:    70 years       BP:           132/77 mmHg Patient  Gender: M              HR:           62 bpm. Exam Location:  [facility] Procedure: 2D Echo Indications:     Abnormal ECG R94.31  History:         Patient has no prior history of  Echocardiogram examinations.  Sonographer:     Overton Mamikeshia Johnson RDCS Referring Phys:  [id number] MATTHEW M ECKSTAT Diagnosing Phys: Julien Nordmannimothy Gollan MD  Sonographer Comments: Technically challenging study due to limited acoustic windows and suboptimal apical window. Image acquisition challenging due to patient body habitus. IMPRESSIONS  1. Left ventricular ejection fraction, by estimation, is 60 to 65%. The left ventricle has normal function. The left ventricle has no regional wall motion abnormalities. Left ventricular diastolic parameters are consistent with Grade I diastolic dysfunction (impaired relaxation).  2. Right ventricular systolic function is normal. The right ventricular size is normal. Tricuspid regurgitation signal is inadequate for assessing PA pressure.  3. The mitral valve is normal in structure. Trivial mitral valve regurgitation. No evidence of mitral stenosis. FINDINGS  Left Ventricle: Left ventricular ejection fraction, by estimation, is 60 to 65%. The left ventricle has normal function. The left ventricle has no regional wall motion abnormalities. The left ventricular internal cavity size was normal in size. There is  no left ventricular hypertrophy. Left ventricular diastolic parameters are consistent with Grade I diastolic dysfunction (impaired relaxation). Right Ventricle: The right ventricular size is normal. No increase in right ventricular wall thickness. Right ventricular systolic function is normal. Tricuspid regurgitation signal is inadequate for assessing PA pressure. Left Atrium: Left atrial size was normal in size. Right Atrium: Right atrial size was normal in size. Pericardium: There is no evidence of pericardial effusion. Mitral Valve: The mitral valve is normal in structure. Trivial mitral valve regurgitation. No  evidence of mitral valve stenosis. Tricuspid Valve: The tricuspid valve is normal in structure. Tricuspid valve regurgitation is not demonstrated. No evidence of tricuspid stenosis. Aortic Valve: The aortic valve was not well visualized. Aortic valve regurgitation is not visualized. No aortic stenosis is present. Aortic valve peak gradient measures 4.0 mmHg. Pulmonic Valve: The pulmonic valve was normal in structure. Pulmonic valve regurgitation is not visualized. No evidence of pulmonic stenosis. Aorta: The aortic root is normal in size and structure. Venous: The inferior vena cava is normal in size with greater than 50% respiratory variability, suggesting right atrial pressure of 3 mmHg. IAS/Shunts: No atrial level shunt detected by color flow Doppler.  LEFT VENTRICLE PLAX 2D LVIDd:         4.60 cm  Diastology LVIDs:         3.30 cm  LV e' medial:    4.68 cm/s LV PW:         1.10 cm  LV E/e' medial:  15.1 LV IVS:        1.00 cm  LV e' lateral:   6.74 cm/s LVOT diam:     2.00 cm  LV E/e' lateral: 10.5 LV SV:         75 LV SV Index:   40 LVOT Area:     3.14 cm  RIGHT VENTRICLE RV Basal diam:  3.10 cm RV S [name]:     14.80 cm/s LEFT ATRIUM             Index       RIGHT ATRIUM           Index LA diam:        4.20 cm 2.26 cm/m  RA Area:     13.10 cm LA Vol (A2C):   19.7 ml 10.60 ml/m RA Volume:   33.20 ml  17.86 ml/m LA Vol (A4C):   28.9 ml 15.55 ml/m LA Biplane Vol: 26.2 ml 14.09 ml/m  AORTIC VALVE AV Area (Vmax): 3.17  cm AV Vmax:        100.00 cm/s AV Peak Grad:   4.0 mmHg LVOT Vmax:      101.00 cm/s LVOT Vmean:     64.100 cm/s LVOT VTI:       0.239 m  AORTA Ao Root diam: 3.80 cm MITRAL VALVE MV Area (PHT): 2.79 cm    SHUNTS MV Decel Time: 272 msec    Systemic VTI:  0.24 m MV E velocity: 70.70 cm/s  Systemic Diam: 2.00 cm MV A velocity: 93.40 cm/s MV E/A ratio:  0.76 Julien Nordmann MD Electronically signed by Julien Nordmann MD Signature Date/Time: 12/24/2020/4:09:49 PM    Final         Scheduled Meds:   escitalopram  10 mg Oral Daily   folic acid  1 mg Oral Daily   gabapentin  300 mg Oral TID   hydrocortisone  10 mg Oral QPM   hydrocortisone  20 mg Oral Daily   multivitamin with minerals  1 tablet Oral Daily   nirmatrelvir/ritonavir EUA  3 tablet Oral BID   pantoprazole  40 mg Oral BID   sodium chloride flush  3 mL Intravenous Q12H   thiamine  100 mg Oral Daily   Or   thiamine  100 mg Intravenous Daily   thiamine  100 mg Oral Daily   traZODone  100 mg Oral QHS   Continuous Infusions:   LOS: 3 days    Time spent: 25 minutes    Alberteen Sam, MD [facility] 12/25/2020, 4:43 PM     Please page though AMION or Epic secure chat:  For [employer], Higher education careers adviser

## 2020-12-25 NOTE — Progress Notes (Signed)
Occupational Therapy Treatment Patient Details Name: Chad [name] MRN: [id number] DOB: 03-08-1951 Today's Date: 12/25/2020    History of present illness Chad [name] is a 70yoM who comes to [facility] 12/21/20 via [name] [employer]. Police contacted after neighbors found patient to not be managing his exrement in an appropriate manner. Pt found to have (+) COVID test. Psych is following. PMH: ETOH abuse, MDD, BPH, HTN, DVT, acute encephalopathy.   OT comments  Chad [name] was seen for OT treatment on this date. Upon arrival to room pt reclined in bed c/o 6/10 abdominal pain, agreeable to tx. Pt requires CGA don B socks seated EOB - asisst for posterior/lateral lean. CGA + RW for ADL t/f and grooming standing sinkside - pt tolerates ~1 min standing ADL prior to decreasing to MIN A for posterior lean, requests to sit 2/2 fatigue. Pt completed seated therex as described below - cues for technique. Pt making good progress toward goals. Pt continues to benefit from skilled OT services to maximize return to PLOF and minimize risk of future falls, injury, caregiver burden, and readmission. Will continue to follow POC. Discharge recommendation remains appropriate.    Follow Up Recommendations  SNF    Equipment Recommendations  Other (comment) (defer to next venue of care)    Recommendations for Other Services      Precautions / Restrictions Precautions Precautions: Fall Restrictions Weight Bearing Restrictions: No       Mobility Bed Mobility Overal bed mobility: Needs Assistance Bed Mobility: Supine to Sit;Sit to Supine     Supine to sit: Min guard Sit to supine: Min guard        Transfers Overall transfer level: Needs assistance Equipment used: Rolling walker (2 wheeled) Transfers: Sit to/from Stand Sit to Stand: Min guard         General transfer comment: Initial CGA decreasing to MIN A to stabilize RW as pt fatigues (x12 STS performed)    Balance Overall balance assessment: Needs  assistance Sitting-balance support: Feet supported;No upper extremity supported Sitting balance-Leahy Scale: Poor Sitting balance - Comments: MIN A for lateral lean wiht dynamic activity   Standing balance support: Single extremity supported;During functional activity Standing balance-Leahy Scale: Fair Standing balance comment: CGA decreasing to MIN A as pt fatigues ~ 1 min                           ADL either performed or assessed with clinical judgement   ADL Overall ADL's : Needs assistance/impaired                                       General ADL Comments: CGA don B socks seated EOB - asisst for posterior/lateral lean. CGA + RW for ADL t/f and grooming standing sinkside - pt tolerates ~1 min standing ADL prior to decreasing to MIN A for posterior lean, requests to sit 2/2 fatigue.      Cognition Arousal/Alertness: Awake/alert Behavior During Therapy: WFL for tasks assessed/performed;Flat affect Overall Cognitive Status: No family/caregiver present to determine baseline cognitive functioning                                 General Comments: Pt inquires about OT's daughter howeve        Exercises Exercises: Other exercises;General Upper Extremity;General Lower Extremity General Exercises - Upper  Extremity Shoulder Flexion: AROM;Strengthening;Both;10 reps;Seated Shoulder Extension: AROM;Strengthening;Both;10 reps;Seated Shoulder Horizontal ABduction: AROM;Strengthening;Both;10 reps;Seated Shoulder Horizontal ADduction: AROM;Strengthening;Both;10 reps;Seated Elbow Flexion: AROM;Strengthening;Both;10 reps;Seated Elbow Extension: AROM;Strengthening;Both;10 reps;Seated Chair Push Up: AROM;Strengthening;Both;10 reps;Seated General Exercises - Lower Extremity Gluteal Sets: AROM;Strengthening;Both;10 reps;Seated Long Arc Quad: AROM;Strengthening;Both;10 reps;Seated Hip ABduction/ADduction: AROM;Strengthening;Both;10 reps;Seated Hip  Flexion/Marching: AROM;Strengthening;Both;10 reps;Seated Other Exercises Other Exercises: Pt educated re: HEP, DME recs, d/c recs, falls prevention, ECS Other Exercises: LBD, tooth brushing, sup<>sit, sit<>stand, sitting/standing blanace/tolerance           Pertinent Vitals/ Pain       Pain Assessment: 0-10 Pain Score: 6  Pain Location: abdomen Pain Descriptors / Indicators: Discomfort;Dull;Grimacing Pain Intervention(s): Limited activity within patient's tolerance;Repositioned   Frequency  Min 2X/week        Progress Toward Goals  OT Goals(current goals can now be found in the care plan section)  Progress towards OT goals: Progressing toward goals  Acute Rehab OT Goals Patient Stated Goal: to feel better OT Goal Formulation: With patient Time For Goal Achievement: 01/06/21 ADL Goals Pt Will Perform Grooming: with min guard assist;standing Pt Will Perform Lower Body Dressing: with min guard assist;sit to/from stand Pt Will Transfer to Toilet: with min guard assist;ambulating;regular height toilet  Plan Discharge plan remains appropriate;Frequency remains appropriate    Co-evaluation                 AM-PAC OT "6 Clicks" Daily Activity     Outcome Measure   Help from another person eating meals?: None Help from another person taking care of personal grooming?: A Little Help from another person toileting, which includes using toliet, bedpan, or urinal?: A Lot Help from another person bathing (including washing, rinsing, drying)?: A Lot Help from another person to put on and taking off regular upper body clothing?: A Little Help from another person to put on and taking off regular lower body clothing?: A Lot 6 Click Score: 16    End of Session Equipment Utilized During Treatment: Rolling walker  OT Visit Diagnosis: Unsteadiness on feet (R26.81);History of falling (Z91.81);Muscle weakness (generalized) (M62.81)   Activity Tolerance Patient tolerated  treatment well   Patient Left in bed;with call bell/phone within reach;with bed alarm set   Nurse Communication          Time: 4765-4650 OT Time Calculation (min): 28 min  Charges: OT General Charges $OT Visit: 1 Visit OT Treatments $Self Care/Home Management : 8-22 mins $Therapeutic Exercise: 8-22 mins  Kathie Dike, M.S. OTR/L  12/25/20, 3:10 PM  ascom [phone]

## 2020-12-25 NOTE — Clinical Social Work Note (Addendum)
RE: Chad [name] Date of Birth: 08-24-1950 Date: 12/25/2020   To Whom It May Concern:  Please be advised that the above-named patient will require a short-term nursing home stay - anticipated 30 days or less for rehabilitation and strengthening.  The plan is for return home.

## 2020-12-26 DIAGNOSIS — F10239 Alcohol dependence with withdrawal, unspecified: Secondary | ICD-10-CM

## 2020-12-26 DIAGNOSIS — D696 Thrombocytopenia, unspecified: Secondary | ICD-10-CM

## 2020-12-26 LAB — COMPREHENSIVE METABOLIC PANEL
ALT: 16 U/L (ref 0–44)
AST: 20 U/L (ref 15–41)
Albumin: 2.8 g/dL — ABNORMAL LOW (ref 3.5–5.0)
Alkaline Phosphatase: 75 U/L (ref 38–126)
Anion gap: 6 (ref 5–15)
BUN: 11 mg/dL (ref 8–23)
CO2: 24 mmol/L (ref 22–32)
Calcium: 8.5 mg/dL — ABNORMAL LOW (ref 8.9–10.3)
Chloride: 109 mmol/L (ref 98–111)
Creatinine, Ser: 0.55 mg/dL — ABNORMAL LOW (ref 0.61–1.24)
GFR, Estimated: >60 mL/min (ref >60)
Glucose, Bld: 89 mg/dL (ref 70–99)
Potassium: 4.1 mmol/L (ref 3.5–5.1)
Sodium: 139 mmol/L (ref 135–145)
Total Bilirubin: 0.5 mg/dL (ref 0.3–1.2)
Total Protein: 5.7 g/dL — ABNORMAL LOW (ref 6.5–8.1)

## 2020-12-26 LAB — C-REACTIVE PROTEIN: CRP: 1.5 mg/dL — ABNORMAL HIGH (ref <1.0)

## 2020-12-26 MED ORDER — ENOXAPARIN SODIUM 40 MG/0.4ML IJ SOSY
40.0000 mg | PREFILLED_SYRINGE | INTRAMUSCULAR | Status: DC
Start: 2020-12-26 — End: 2021-01-05
  Administered 2020-12-26 – 2021-01-03 (×9): 40 mg via SUBCUTANEOUS
  Filled 2020-12-26 (×9): qty 0.4

## 2020-12-26 MED ORDER — HYDROCODONE-ACETAMINOPHEN 5-325 MG PO TABS
1.0000 | ORAL_TABLET | Freq: Four times a day (QID) | ORAL | Status: DC | PRN
  Administered 2020-12-26 – 2020-12-30 (×16): 2 via ORAL
  Administered 2020-12-31: 1 via ORAL
  Administered 2020-12-31 – 2021-01-04 (×16): 2 via ORAL
  Filled 2020-12-26 (×33): qty 2

## 2020-12-26 NOTE — Progress Notes (Signed)
PROGRESS NOTE    Chad [name]  TIR:[id number] DOB: 1950/10/26 DOA: 12/21/2020 PCP: Rayetta Humphrey, MD   Assessment & Plan:   Principal Problem:   Moderate recurrent major depression (HCC) Active Problems:   HTN (hypertension)   Alcohol withdrawal (HCC)   DVT (deep venous thrombosis) (HCC)   Alcohol dependence with uncomplicated withdrawal (HCC)   Alcohol-induced mood disorder (HCC)   BPH (benign prostatic hyperplasia)   Wernicke's encephalopathy   Physical deconditioning   COVID-19   COVID19: continue on paxlovid. Continue on airborne and contact precautions  Alcohol withdrawal: continue on CIWA protocol   Alcohol abuse disorder: alcohol cessation counseling. Continue on thiamine, folate. Continue on gabapentin   Depression: severity unknown. Continue on lexapro, trazodone. 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 Hx of bleeding ulcuers   Hypokalemia: WNL today    Abnormal ECG: New T wave invesions seen on ECG.  Had some chest discomfort but trops here normal.  Chest pain resolved.  Echo obtained and showed normal EF, grade I DD, normal valves, no effusion, no RWMA.  Likely pleuritic pain.      Transaminitis: likely secondary to alcohol abuse. Will continue to monitor   Thrombocytopenia: likely secondary to alcohol abuse  DVT prophylaxis: lovenox  Code Status: full  Family Communication: Disposition Plan: SNF vs HH   Level of care: Med-Surg  Status is: Inpatient  Remains inpatient appropriate because:Unsafe d/c plan, IV treatments appropriate due to intensity of illness or inability to take PO, and Inpatient level of care appropriate due to severity of illness  Dispo: The patient is from: Home              Anticipated d/c is to: SNF vs [facility]               Patient currently is  not medically stable to d/c.   Difficult to place patient : unclear         Consultants:   Procedures:  \ Antimicrobials:    Subjective: Pt c/o pain over entire body   Objective: Vitals:   12/25/20 1511 12/25/20 1929 12/26/20 0114 12/26/20 0410  BP: 127/69 (!) 129/91 126/89 (!) 138/94  Pulse: 68 77 65 60  Resp: 20 20  18   Temp: 97.8 F (36.6 C) 97.9 F (36.6 C)  97.9 F (36.6 C)  TempSrc: Oral Oral    SpO2: 95% 99%  94%  Weight:      Height:        Intake/Output Summary (Last 24 hours) at 12/26/2020 0854 Last data filed at 12/26/2020 0400 Gross per 24 hour  Intake 485 ml  Output 1000 ml  Net -515 ml   Filed Weights   12/21/20 1947 12/21/20 2006  Weight: 72.6 kg 72.6 kg    Examination:  General exam: Appears calm and comfortable  Respiratory system: Clear to auscultation. Respiratory effort normal. Cardiovascular system: S1 & S2 +. No rubs, gallops or clicks.  Gastrointestinal system: Abdomen is nondistended, soft and nontender.  Normal bowel sounds heard. Central nervous system: Alert and oriented. Moves all extremities  Psychiatry: Judgement and insight appear normal. Flat mood and affect     Data Reviewed: I have personally reviewed following labs and imaging studies  CBC: Recent Labs  Lab 12/21/20 1949 12/23/20 0443 12/24/20 0500  WBC 6.7  4.0 5.2  HGB 15.3 11.4* 11.4*  HCT 46.1 33.7* 34.3*  MCV 93.1 94.9 94.0  PLT 324 140* 128*   Basic Metabolic Panel: Recent Labs  Lab 12/21/20 1949 12/23/20 0443 12/24/20 0500 12/25/20 0552 12/26/20 0420  NA 138 138 139 139 139  K 4.3 3.6 3.2* 4.4 4.1  CL 101 105 107 108 109  CO2 19* 26 27 25 24   GLUCOSE 106* 112* 107* 112* 89  BUN 12 6* 6* 11 11  CREATININE 0.64 0.65 0.54* 0.52* 0.55*  CALCIUM 8.7* 8.4* 8.4* 8.6* 8.5*  MG  --  1.8  --   --   --   PHOS  --  4.1  --   --   --    GFR: Estimated Creatinine Clearance: 85.5 mL/min (A) (by C-G formula based on SCr of 0.55 mg/dL (L)). Liver  Function Tests: Recent Labs  Lab 12/21/20 1949 12/23/20 0443 12/24/20 0500 12/25/20 0552 12/26/20 0420  AST 59* 64* 34 24 20  ALT 26 27 19 18 16   ALKPHOS 95 98 88 79 75  BILITOT 0.9 1.1 1.1 0.5 0.5  PROT 6.7 5.5* 5.6* 5.4* 5.7*  ALBUMIN 3.6 2.8* 2.9* 2.8* 2.8*   No results for input(s): LIPASE, AMYLASE in the last 168 hours. No results for input(s): AMMONIA in the last 168 hours. Coagulation Profile: No results for input(s): INR, PROTIME in the last 168 hours. Cardiac Enzymes: No results for input(s): CKTOTAL, CKMB, CKMBINDEX, TROPONINI in the last 168 hours. BNP (last 3 results) No results for input(s): PROBNP in the last 8760 hours. HbA1C: No results for input(s): HGBA1C in the last 72 hours. CBG: No results for input(s): GLUCAP in the last 168 hours. Lipid Profile: No results for input(s): CHOL, HDL, LDLCALC, TRIG, CHOLHDL, LDLDIRECT in the last 72 hours. Thyroid Function Tests: No results for input(s): TSH, T4TOTAL, FREET4, T3FREE, THYROIDAB in the last 72 hours. Anemia Panel: No results for input(s): VITAMINB12, FOLATE, FERRITIN, TIBC, IRON, RETICCTPCT in the last 72 hours. Sepsis Labs: No results for input(s): PROCALCITON, LATICACIDVEN in the last 168 hours.  Recent Results (from the past 240 hour(s))  Resp Panel by RT-PCR (Flu A&B, Covid) Nasopharyngeal Swab     Status: Abnormal   Collection Time: 12/21/20  9:42 PM   Specimen: Nasopharyngeal Swab; Nasopharyngeal(NP) swabs in vial transport medium  Result Value Ref Range Status   SARS Coronavirus 2 by RT PCR POSITIVE (A) NEGATIVE Final    Comment: RESULT CALLED TO, READ BACK BY AND VERIFIED WITH: BRANDON NORRIS AT 2252 ON 12/21/20 BY SS (NOTE) SARS-CoV-2 target nucleic acids are DETECTED.  The SARS-CoV-2 RNA is generally detectable in upper respiratory specimens during the acute phase of infection. Positive results are indicative of the presence of the identified virus, but do not rule out bacterial infection or  co-infection with other pathogens not detected by the test. Clinical correlation with patient history and other diagnostic information is necessary to determine patient infection status. The expected result is Negative.  Fact Sheet for Patients: 2253  Fact Sheet for Healthcare Providers: 02/20/21  This test is not yet approved or cleared by the [employer] FDA and  has been authorized for detection and/or diagnosis of SARS-CoV-2 by FDA under an Emergency Use Authorization (EUA).  This EUA will remain in effect (meaning this test c an be used) for the duration of  the COVID-19 declaration under Section 564(b)(1) of the Act, 21 U.S.C. section 360bbb-3(b)(1), unless the authorization is terminated or revoked sooner.  Influenza A by PCR NEGATIVE NEGATIVE Final   Influenza B by PCR NEGATIVE NEGATIVE Final    Comment: (NOTE) The Xpert Xpress SARS-CoV-2/FLU/RSV plus assay is intended as an aid in the diagnosis of influenza from Nasopharyngeal swab specimens and should not be used as a sole basis for treatment. Nasal washings and aspirates are unacceptable for Xpert Xpress SARS-CoV-2/FLU/RSV testing.  Fact Sheet for Patients: BloggerCourse.com  Fact Sheet for Healthcare Providers: SeriousBroker.it  This test is not yet approved or cleared by the [place] FDA and has been authorized for detection and/or diagnosis of SARS-CoV-2 by FDA under an Emergency Use Authorization (EUA). This EUA will remain in effect (meaning this test can be used) for the duration of the COVID-19 declaration under Section 564(b)(1) of the Act, 21 U.S.C. section 360bbb-3(b)(1), unless the authorization is terminated or revoked.  Performed at [facility], [address], [place], [place] [address]          Radiology Studies: No results found.      Scheduled  Meds:  escitalopram  10 mg Oral Daily   folic acid  1 mg Oral Daily   gabapentin  300 mg Oral TID   hydrocortisone  10 mg Oral QPM   hydrocortisone  20 mg Oral Daily   multivitamin with minerals  1 tablet Oral Daily   nicotine  21 mg Transdermal Daily   nirmatrelvir/ritonavir EUA  3 tablet Oral BID   pantoprazole  40 mg Oral BID   sodium chloride flush  3 mL Intravenous Q12H   thiamine  100 mg Oral Daily   Or   thiamine  100 mg Intravenous Daily   thiamine  100 mg Oral Daily   traZODone  100 mg Oral QHS   Continuous Infusions:   LOS: 4 days    Time spent: 32 mins     Charise Killian, MD [facility] Pager [phone]  If 7PM-7AM, please contact night-coverage 12/26/2020, 8:54 AM

## 2020-12-26 NOTE — TOC Progression Note (Addendum)
Transition of Care [facility]) - Progression Note    Patient Details  Name: Chad [name] MRN: [id number] Date of Birth: [birth year]  [facility]) CM/SW Contact  Margarito Liner, LCSW Phone Number: 12/26/2020, 8:21 AM  Clinical Narrative:  No bed offers yet. [facility] coordinator will follow up with decision.   10:29 am: [facility] can offer patient a bed and can take him on 9/13 if insurance authorization approved. Tried calling in room to notify patient but line was busy. Will try again later.  10:59 am: [facility]ARR obtained: [id number]. Expires 10/7.  11:41 am: Updated patient. He is agreeable to [facility]. Printed off and put in chart to go with him when he discharges.  Expected Discharge Plan: Skilled Nursing Facility Barriers to Discharge: Other (must enter comment) (COVID Isolation)  Expected Discharge Plan and Services Expected Discharge Plan: Skilled Nursing Facility     Post Acute Care Choice: Skilled Nursing Facility Living arrangements for the past 2 months: Single Family Home                                       Social Determinants of Health (SDOH) Interventions    Readmission Risk Interventions No flowsheet data found.

## 2020-12-27 LAB — COMPREHENSIVE METABOLIC PANEL
ALT: 16 U/L (ref 0–44)
AST: 20 U/L (ref 15–41)
Albumin: 2.8 g/dL — ABNORMAL LOW (ref 3.5–5.0)
Alkaline Phosphatase: 65 U/L (ref 38–126)
Anion gap: 8 (ref 5–15)
BUN: 14 mg/dL (ref 8–23)
CO2: 25 mmol/L (ref 22–32)
Calcium: 8.7 mg/dL — ABNORMAL LOW (ref 8.9–10.3)
Chloride: 104 mmol/L (ref 98–111)
Creatinine, Ser: 0.6 mg/dL — ABNORMAL LOW (ref 0.61–1.24)
GFR, Estimated: >60 mL/min (ref >60)
Glucose, Bld: 92 mg/dL (ref 70–99)
Potassium: 4 mmol/L (ref 3.5–5.1)
Sodium: 137 mmol/L (ref 135–145)
Total Bilirubin: 0.5 mg/dL (ref 0.3–1.2)
Total Protein: 5.7 g/dL — ABNORMAL LOW (ref 6.5–8.1)

## 2020-12-27 LAB — CBC
HCT: 34.9 % — ABNORMAL LOW (ref 39.0–52.0)
Hemoglobin: 11.3 g/dL — ABNORMAL LOW (ref 13.0–17.0)
MCH: 31.4 pg (ref 26.0–34.0)
MCHC: 32.4 g/dL (ref 30.0–36.0)
MCV: 96.9 fL (ref 80.0–100.0)
Platelets: 135 10*3/uL — ABNORMAL LOW (ref 150–400)
RBC: 3.6 MIL/uL — ABNORMAL LOW (ref 4.22–5.81)
RDW: 16.4 % — ABNORMAL HIGH (ref 11.5–15.5)
WBC: 6.4 10*3/uL (ref 4.0–10.5)
nRBC: 0 % (ref 0.0–0.2)

## 2020-12-27 LAB — C-REACTIVE PROTEIN: CRP: 1.1 mg/dL — ABNORMAL HIGH (ref <1.0)

## 2020-12-27 MED ORDER — TRAZODONE HCL 50 MG PO TABS
150.0000 mg | ORAL_TABLET | Freq: Every day | ORAL | Status: DC
  Administered 2020-12-27 – 2021-01-03 (×8): 150 mg via ORAL
  Filled 2020-12-27 (×8): qty 1

## 2020-12-27 NOTE — TOC Progression Note (Signed)
Transition of Care [facility]) - Progression Note    Patient Details  Name: Chad [name] MRN: [id number] Date of Birth: 09-18-1950  Transition [facility] Care [facility]) CM/SW Contact  Margarito Liner, LCSW Phone Number: 12/27/2020, 11:12 AM  Clinical Narrative:  [facility] social worker faxed over resources patient had requested. Added to resources that were put in chart yesterday.   Expected Discharge Plan: Skilled Nursing Facility Barriers to Discharge: Other (must enter comment) (COVID Isolation)  Expected Discharge Plan and Services Expected Discharge Plan: Skilled Nursing Facility     Post Acute Care Choice: Skilled Nursing Facility Living arrangements for the past 2 months: Single Family Home                                       Social Determinants of Health (SDOH) Interventions    Readmission Risk Interventions No flowsheet data found.

## 2020-12-27 NOTE — Progress Notes (Signed)
PROGRESS NOTE    Chad [name]  YKD:[id number] DOB: 31-Dec-1950 DOA: 12/21/2020 PCP: Rayetta Humphrey, MD   Assessment & Plan:   Principal Problem:   Moderate recurrent major depression (HCC) Active Problems:   HTN (hypertension)   Alcohol withdrawal (HCC)   DVT (deep venous thrombosis) (HCC)   Alcohol dependence with uncomplicated withdrawal (HCC)   Alcohol-induced mood disorder (HCC)   BPH (benign prostatic hyperplasia)   Wernicke's encephalopathy   Physical deconditioning   COVID-19   COVID19: continue on paxlovid. Continue on airborne and contact precautions  Alcohol withdrawal: continue on CIWA protocol   Alcohol abuse disorder: alcohol cessation counseling. Continue on thiamine, folate. Continue on gabapentin   Depression: severity unknown. Continue on lexapro, trazodone. 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 Hx of bleeding ulcers   Hypokalemia: within normal limits today     Abnormal ECG: New T wave invesions seen on ECG.  Had some chest discomfort but trops here normal.  Chest pain resolved.  Echo obtained and showed normal EF, grade I DD, normal valves, no effusion, no RWMA.  Likely pleuritic pain.      Transaminitis:resolved   Thrombocytopenia:likely secondary to alcohol abuse   DVT prophylaxis: lovenox  Code Status: full  Family Communication: Disposition Plan: SNF onn 01/01/21  Level of care: Med-Surg  Status is: Inpatient  Remains inpatient appropriate because:Unsafe d/c plan, IV treatments appropriate due to intensity of illness or inability to take PO, and Inpatient level of care appropriate due to severity of illness  Dispo: The patient is from: Home              Anticipated d/c is to: SNF               Patient currently is not medically stable to d/c.    Difficult to place patient : unclear         Consultants:   Procedures:  \ Antimicrobials:    Subjective: Pt c/o malaise  Objective: Vitals:   12/26/20 0410 12/26/20 1709 12/26/20 2001 12/27/20 0440  BP: (!) 138/94 132/80 132/75 139/71  Pulse: 60 67 65 (!) 57  Resp: 18 16 18 18   Temp: 97.9 F (36.6 C) 97.7 F (36.5 C) 97.8 F (36.6 C) 97.9 F (36.6 C)  TempSrc:  Oral Oral Oral  SpO2: 94% 98% 96% 95%  Weight:      Height:        Intake/Output Summary (Last 24 hours) at 12/27/2020 0801 Last data filed at 12/27/2020 0400 Gross per 24 hour  Intake 5 ml  Output 1550 ml  Net -1545 ml   Filed Weights   12/21/20 1947 12/21/20 2006  Weight: 72.6 kg 72.6 kg    Examination:  General exam: Appears comfortable. Disheveled  Respiratory system: Clear breath sounds b/l. Cardiovascular system: S1/S2+. No rubs or clicks  Gastrointestinal system: Abd is soft, NT, ND & hypoactive bowel sounds . Central nervous system: Alert and oriented. Moves all extremities   Psychiatry: judgement and insight appear normal. Flat mood and affect    Data Reviewed: I have personally reviewed following labs and imaging studies  CBC: Recent Labs  Lab 12/21/20 1949 12/23/20 0443 12/24/20 0500 12/27/20 0542  WBC 6.7 4.0 5.2 6.4  HGB 15.3 11.4* 11.4* 11.3*  HCT 46.1 33.7* 34.3* 34.9*  MCV 93.1 94.9 94.0 96.9  PLT 324 140* 128* 135*   Basic Metabolic Panel: Recent Labs  Lab 12/23/20 0443 12/24/20 0500 12/25/20 0552 12/26/20 0420 12/27/20 0542  NA 138 139 139 139 137  K 3.6 3.2* 4.4 4.1 4.0  CL 105 107 108 109 104  CO2 26 27 25 24 25   GLUCOSE 112* 107* 112* 89 92  BUN 6* 6* 11 11 14   CREATININE 0.65 0.54* 0.52* 0.55* 0.60*  CALCIUM 8.4* 8.4* 8.6* 8.5* 8.7*  MG 1.8  --   --   --   --   PHOS 4.1  --   --   --   --    GFR: Estimated Creatinine Clearance: 85.5 mL/min (A) (by C-G formula based on SCr of 0.6 mg/dL (L)). Liver Function Tests: Recent Labs  Lab 12/23/20 0443  12/24/20 0500 12/25/20 0552 12/26/20 0420 12/27/20 0542  AST 64* 34 24 20 20   ALT 27 19 18 16 16   ALKPHOS 98 88 79 75 65  BILITOT 1.1 1.1 0.5 0.5 0.5  PROT 5.5* 5.6* 5.4* 5.7* 5.7*  ALBUMIN 2.8* 2.9* 2.8* 2.8* 2.8*   No results for input(s): LIPASE, AMYLASE in the last 168 hours. No results for input(s): AMMONIA in the last 168 hours. Coagulation Profile: No results for input(s): INR, PROTIME in the last 168 hours. Cardiac Enzymes: No results for input(s): CKTOTAL, CKMB, CKMBINDEX, TROPONINI in the last 168 hours. BNP (last 3 results) No results for input(s): PROBNP in the last 8760 hours. HbA1C: No results for input(s): HGBA1C in the last 72 hours. CBG: No results for input(s): GLUCAP in the last 168 hours. Lipid Profile: No results for input(s): CHOL, HDL, LDLCALC, TRIG, CHOLHDL, LDLDIRECT in the last 72 hours. Thyroid Function Tests: No results for input(s): TSH, T4TOTAL, FREET4, T3FREE, THYROIDAB in the last 72 hours. Anemia Panel: No results for input(s): VITAMINB12, FOLATE, FERRITIN, TIBC, IRON, RETICCTPCT in the last 72 hours. Sepsis Labs: No results for input(s): PROCALCITON, LATICACIDVEN in the last 168 hours.  Recent Results (from the past 240 hour(s))  Resp Panel by RT-PCR (Flu A&B, Covid) Nasopharyngeal Swab     Status: Abnormal   Collection Time: 12/21/20  9:42 PM   Specimen: Nasopharyngeal Swab; Nasopharyngeal(NP) swabs in vial transport medium  Result Value Ref Range Status   SARS Coronavirus 2 by RT PCR POSITIVE (A) NEGATIVE Final    Comment: RESULT CALLED TO, READ BACK BY AND VERIFIED WITH: BRANDON NORRIS AT 2252 ON 12/21/20 BY SS (NOTE) SARS-CoV-2 target nucleic acids are DETECTED.  The SARS-CoV-2 RNA is generally detectable in upper respiratory specimens during the acute phase of infection. Positive results are indicative of the presence of the identified virus, but do not rule out bacterial infection or co-infection with other pathogens not detected  by the test. Clinical correlation with patient history and other diagnostic information is necessary to determine patient infection status. The expected result is Negative.  Fact Sheet for Patients:  Fact Sheet for Healthcare Providers: 02/20/21  This test is not yet approved or cleared by the 2253 FDA and  has been authorized for detection and/or diagnosis of SARS-CoV-2 by FDA under an Emergency Use Authorization (EUA).  This EUA will remain in effect (meaning this test c an be used) for the duration of  the COVID-19 declaration under Section 564(b)(1) of the Act, 21 U.S.C. section 360bbb-3(b)(1), unless the authorization is terminated or revoked sooner.     Influenza A by PCR NEGATIVE NEGATIVE Final  Influenza B by PCR NEGATIVE NEGATIVE Final    Comment: (NOTE) The Xpert Xpress SARS-CoV-2/FLU/RSV plus assay is intended as an aid in the diagnosis of influenza from Nasopharyngeal swab specimens and should not be used as a sole basis for treatment. Nasal washings and aspirates are unacceptable for Xpert Xpress SARS-CoV-2/FLU/RSV testing.  Fact Sheet for Patients: BloggerCourse.com  Fact Sheet for Healthcare Providers: SeriousBroker.it  This test is not yet approved or cleared by the [place] FDA and has been authorized for detection and/or diagnosis of SARS-CoV-2 by FDA under an Emergency Use Authorization (EUA). This EUA will remain in effect (meaning this test can be used) for the duration of the COVID-19 declaration under Section 564(b)(1) of the Act, 21 U.S.C. section 360bbb-3(b)(1), unless the authorization is terminated or revoked.  Performed at [facility], [address] [place], [place] [address]          Radiology Studies: No results found.      Scheduled Meds:  enoxaparin (LOVENOX) injection  40 mg  Subcutaneous Q24H   escitalopram  10 mg Oral Daily   folic acid  1 mg Oral Daily   gabapentin  300 mg Oral TID   hydrocortisone  10 mg Oral QPM   hydrocortisone  20 mg Oral Daily   multivitamin with minerals  1 tablet Oral Daily   nicotine  21 mg Transdermal Daily   nirmatrelvir/ritonavir EUA  3 tablet Oral BID   pantoprazole  40 mg Oral BID   sodium chloride flush  3 mL Intravenous Q12H   thiamine  100 mg Oral Daily   Or   thiamine  100 mg Intravenous Daily   thiamine  100 mg Oral Daily   traZODone  100 mg Oral QHS   Continuous Infusions:   LOS: 5 days    Time spent: 20 mins     Charise Killian, MD [facility] Pager [phone]  If 7PM-7AM, please contact night-coverage 12/27/2020, 8:01 AM

## 2020-12-27 NOTE — Progress Notes (Signed)
Physical Therapy Treatment Patient Details Name: Chad [name] MRN: [id number] DOB: Oct 28, 1950 Today's Date: 12/27/2020    History of Present Illness Chad [name] is a 70yoM who comes [facility]/21/20 via [name]. Police contacted after neighbors found patient to not be managing his exrement in an appropriate manner. Pt found to have (+) COVID test. Psych is following. PMH: ETOH abuse, MDD, BPH, HTN, DVT, acute encephalopathy.    PT Comments    Pt in bed upon entry, reports to need assist after detachment of condom cath. Pt is concerned about soiling the bed and voices desire to remain compliant with remaining in bed as asked. Author has to educate patient that it is ok to be OOB with staff. minA SPT to [facility], pt sits for 8 minutes without any successful voiding. Pt then prepared for AMB, which he agrees to despite strong desire to have condom cath problem resolved first. Pt AMB 2 laps in room, minGuard for safety, no frank LOB albeit some unsteadiness when turning. Pt denies any dizziness, pain exacerbation. Pt left at EOB with NA at bedside. Authro encouraged pt to liberate the condom cath and return to baseline routine of OOB for bowel movements and advance to urinal jug for ad lib voiding.     Follow Up Recommendations  SNF;Supervision for mobility/OOB;Supervision - Intermittent     Equipment Recommendations  None recommended by PT    Recommendations for Other Services       Precautions / Restrictions Precautions Precautions: Fall Restrictions Weight Bearing Restrictions: No    Mobility  Bed Mobility Overal bed mobility: Needs Assistance Bed Mobility: Supine to Sit     Supine to sit: Supervision          Transfers Overall transfer level: Needs assistance Equipment used: Rolling walker (2 wheeled) Transfers: Sit to/from [employer] Sit to Stand: Min guard Stand pivot transfers: Min assist       General transfer comment: unsteaady but follows cues  when given  Ambulation/Gait Ambulation/Gait assistance: Min guard Gait Distance (Feet): 75 Feet Assistive device: Rolling walker (2 wheeled) Gait Pattern/deviations: WFL(Within Functional Limits)     General Gait Details: safe use of RW, no LOB, unsteady when turning, reports to feel good, but refuses further AMB until condom cath is put back in place.   Stairs             Wheelchair Mobility    Modified Rankin (Stroke Patients Only)       Balance Overall balance assessment: History of Falls;Mild deficits observed, not formally tested                                          Cognition Arousal/Alertness: Awake/alert Behavior During Therapy: WFL for tasks assessed/performed;Impulsive;Agitated;Anxious                                   General Comments: perseverating about placement of new condom cath, continues to make this a greater priority than anything else in session and ultimately is irritated when Thereasa Parkin asks him to AMB prior to condom cath placement.      Exercises      General Comments        Pertinent Vitals/Pain Pain Assessment: 0-10 Pain Score: 7  Pain Location: bilat hips/shoulders Pain Descriptors / Indicators: Discomfort;Dull;Grimacing  Home Living                      Prior Function            PT Goals (current goals can now be found in the care plan section) Acute Rehab PT Goals Patient Stated Goal: to feel better PT Goal Formulation: With patient Time For Goal Achievement: 01/07/21 Potential to Achieve Goals: Fair Progress towards PT goals: Progressing toward goals    Frequency           PT Plan Current plan remains appropriate    Co-evaluation              AM-PAC PT "6 Clicks" Mobility   Outcome Measure  Help needed turning from your back to your side while in a flat bed without using bedrails?: A Little Help needed moving from lying on your back to sitting on the side  of a flat bed without using bedrails?: A Little Help needed moving to and from a bed to a chair (including a wheelchair)?: A Little Help needed standing up from a chair using your arms (e.g., wheelchair or bedside chair)?: A Little Help needed to walk in hospital room?: A Little Help needed climbing 3-5 steps with a railing? : A Lot 6 Click Score: 17    End of Session   Activity Tolerance: Patient tolerated treatment well;No increased pain Patient left: in bed;with nursing/sitter in room;with call bell/phone within reach Nurse Communication: Mobility status PT Visit Diagnosis: Difficulty in walking, not elsewhere classified (R26.2);Other abnormalities of gait and mobility (R26.89);Repeated falls (R29.6);Muscle weakness (generalized) (M62.81);Other symptoms and signs involving the nervous system (R29.898);Dizziness and giddiness (R42)     Time: 7412-8786 PT Time Calculation (min) (ACUTE ONLY): 14 min  Charges:  $Therapeutic Exercise: 8-22 mins                    3:26 PM, 12/27/20 Rosamaria Lints, PT, DPT Physical Therapist - [facility]  [phone] (ASCOM)      Chad [name] 12/27/2020, 3:20 PM

## 2020-12-28 DIAGNOSIS — E440 Moderate protein-calorie malnutrition: Secondary | ICD-10-CM | POA: Insufficient documentation

## 2020-12-28 LAB — BASIC METABOLIC PANEL
Anion gap: 9 (ref 5–15)
BUN: 13 mg/dL (ref 8–23)
CO2: 27 mmol/L (ref 22–32)
Calcium: 9.3 mg/dL (ref 8.9–10.3)
Chloride: 98 mmol/L (ref 98–111)
Creatinine, Ser: 0.54 mg/dL — ABNORMAL LOW (ref 0.61–1.24)
GFR, Estimated: >60 mL/min (ref >60)
Glucose, Bld: 92 mg/dL (ref 70–99)
Potassium: 4.2 mmol/L (ref 3.5–5.1)
Sodium: 134 mmol/L — ABNORMAL LOW (ref 135–145)

## 2020-12-28 LAB — CBC
HCT: 36.5 % — ABNORMAL LOW (ref 39.0–52.0)
Hemoglobin: 12.1 g/dL — ABNORMAL LOW (ref 13.0–17.0)
MCH: 31.9 pg (ref 26.0–34.0)
MCHC: 33.2 g/dL (ref 30.0–36.0)
MCV: 96.3 fL (ref 80.0–100.0)
Platelets: 186 10*3/uL (ref 150–400)
RBC: 3.79 MIL/uL — ABNORMAL LOW (ref 4.22–5.81)
RDW: 16.4 % — ABNORMAL HIGH (ref 11.5–15.5)
WBC: 6.7 10*3/uL (ref 4.0–10.5)
nRBC: 0 % (ref 0.0–0.2)

## 2020-12-28 LAB — C-REACTIVE PROTEIN: CRP: 0.7 mg/dL (ref <1.0)

## 2020-12-28 MED ORDER — HYDROXYZINE HCL 25 MG PO TABS
25.0000 mg | ORAL_TABLET | Freq: Three times a day (TID) | ORAL | Status: DC | PRN
  Administered 2020-12-28 – 2020-12-31 (×6): 25 mg via ORAL
  Filled 2020-12-28 (×6): qty 1

## 2020-12-28 MED ORDER — ENSURE ENLIVE PO LIQD
237.0000 mL | Freq: Three times a day (TID) | ORAL | Status: DC
  Administered 2020-12-28 – 2021-01-04 (×23): 237 mL via ORAL

## 2020-12-28 NOTE — Progress Notes (Signed)
PROGRESS NOTE    Chad [name]  XVQ:[id number] DOB: [birth year] DOA: 12/21/2020 PCP: Rayetta Humphrey, MD   Assessment & Plan:   Principal Problem:   Moderate recurrent major depression (HCC) Active Problems:   HTN (hypertension)   Alcohol withdrawal (HCC)   DVT (deep venous thrombosis) (HCC)   Alcohol dependence with uncomplicated withdrawal (HCC)   Alcohol-induced mood disorder (HCC)   BPH (benign prostatic hyperplasia)   Wernicke's encephalopathy   Physical deconditioning   COVID-19   Malnutrition of moderate degree   COVID19: completed paxlovid course. Continue on airborne and contact precautions   Alcohol withdrawal: resolving. Not currently scoring on CIWA   Alcohol abuse disorder: alcohol cessation counseling. Continue on thiamine, folate. Continue on gabapentin   Depression: severity unknown. Continue on lexapro, trazodone. 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 Continue on PPI    Hypokalemia: WNL today      Abnormal ECG: New T wave invesions seen on ECG.  Had some chest discomfort but trops here normal.  Chest pain resolved.  Echo obtained and showed normal EF, grade I DD, normal valves, no effusion, no RWMA.  Likely pleuritic pain.      Transaminitis:resolved   Thrombocytopenia: resolved   Moderate malnutrition: continue on nutritional supplements   DVT prophylaxis: lovenox  Code Status: full  Family Communication: Disposition Plan: SNF onn 01/01/21  Level of care: Med-Surg  Status is: Inpatient  Remains inpatient appropriate because:Unsafe d/c plan, IV treatments appropriate due to intensity of illness or inability to take PO, and Inpatient level of care appropriate due to severity of illness, waiting SNF placement   Dispo: The patient is from: Home               Anticipated d/c is to: SNF               Patient currently medically stable for d/c to SNF   Difficult to place patient : unclear         Consultants:   Procedures:  \ Antimicrobials:    Subjective: Pt c/o malaise  Objective: Vitals:   12/27/20 1922 12/28/20 0446 12/28/20 0829 12/28/20 1312  BP: 130/76 (!) 146/80 (!) 151/93   Pulse: 65 61 64   Resp: 20 20 18    Temp: 98 F (36.7 C) 97.7 F (36.5 C) 97.7 F (36.5 C)   TempSrc: Oral Oral Oral   SpO2: 98% 97% 98%   Weight:    72.5 kg  Height:        Intake/Output Summary (Last 24 hours) at 12/28/2020 1447 Last data filed at 12/28/2020 0547 Gross per 24 hour  Intake --  Output 1400 ml  Net -1400 ml   Filed Weights   12/21/20 1947 12/21/20 2006 12/28/20 1312  Weight: 72.6 kg 72.6 kg 72.5 kg    Examination:  General exam: Appears calm. Disheveled  Respiratory system: Clear breath sounds b/l Cardiovascular system: S1 & S2+. No rubs or clicks  Gastrointestinal system: Abd is soft, NT, ND & normal bowel sounds  Central nervous system: Alert and oriented. Moves all extremities  Psychiatry: judgement and insight appear normal. Flat mood and affect    Data Reviewed: I have personally reviewed following labs and imaging studies  CBC: Recent Labs  Lab 12/21/20 1949 12/23/20 0443 12/24/20 0500  12/27/20 0542 12/28/20 0818  WBC 6.7 4.0 5.2 6.4 6.7  HGB 15.3 11.4* 11.4* 11.3* 12.1*  HCT 46.1 33.7* 34.3* 34.9* 36.5*  MCV 93.1 94.9 94.0 96.9 96.3  PLT 324 140* 128* 135* 186   Basic Metabolic Panel: Recent Labs  Lab 12/23/20 0443 12/24/20 0500 12/25/20 0552 12/26/20 0420 12/27/20 0542 12/28/20 0818  NA 138 139 139 139 137 134*  K 3.6 3.2* 4.4 4.1 4.0 4.2  CL 105 107 108 109 104 98  CO2 26 27 25 24 25 27   GLUCOSE 112* 107* 112* 89 92 92  BUN 6* 6* 11 11 14 13   CREATININE 0.65 0.54* 0.52* 0.55* 0.60* 0.54*  CALCIUM 8.4* 8.4* 8.6* 8.5* 8.7* 9.3  MG 1.8  --   --   --   --   --   PHOS 4.1  --    --   --   --   --    GFR: Estimated Creatinine Clearance: 85.5 mL/min (A) (by C-G formula based on SCr of 0.54 mg/dL (L)). Liver Function Tests: Recent Labs  Lab 12/23/20 0443 12/24/20 0500 12/25/20 0552 12/26/20 0420 12/27/20 0542  AST 64* 34 24 20 20   ALT 27 19 18 16 16   ALKPHOS 98 88 79 75 65  BILITOT 1.1 1.1 0.5 0.5 0.5  PROT 5.5* 5.6* 5.4* 5.7* 5.7*  ALBUMIN 2.8* 2.9* 2.8* 2.8* 2.8*   No results for input(s): LIPASE, AMYLASE in the last 168 hours. No results for input(s): AMMONIA in the last 168 hours. Coagulation Profile: No results for input(s): INR, PROTIME in the last 168 hours. Cardiac Enzymes: No results for input(s): CKTOTAL, CKMB, CKMBINDEX, TROPONINI in the last 168 hours. BNP (last 3 results) No results for input(s): PROBNP in the last 8760 hours. HbA1C: No results for input(s): HGBA1C in the last 72 hours. CBG: No results for input(s): GLUCAP in the last 168 hours. Lipid Profile: No results for input(s): CHOL, HDL, LDLCALC, TRIG, CHOLHDL, LDLDIRECT in the last 72 hours. Thyroid Function Tests: No results for input(s): TSH, T4TOTAL, FREET4, T3FREE, THYROIDAB in the last 72 hours. Anemia Panel: No results for input(s): VITAMINB12, FOLATE, FERRITIN, TIBC, IRON, RETICCTPCT in the last 72 hours. Sepsis Labs: No results for input(s): PROCALCITON, LATICACIDVEN in the last 168 hours.  Recent Results (from the past 240 hour(s))  Resp Panel by RT-PCR (Flu A&B, Covid) Nasopharyngeal Swab     Status: Abnormal   Collection Time: 12/21/20  9:42 PM   Specimen: Nasopharyngeal Swab; Nasopharyngeal(NP) swabs in vial transport medium  Result Value Ref Range Status   SARS Coronavirus 2 by RT PCR POSITIVE (A) NEGATIVE Final    Comment: RESULT CALLED TO, READ BACK BY AND VERIFIED WITH: BRANDON NORRIS AT 2252 ON 12/21/20 BY SS (NOTE) SARS-CoV-2 target nucleic acids are DETECTED.  The SARS-CoV-2 RNA is generally detectable in upper respiratory specimens during the acute  phase of infection. Positive results are indicative of the presence of the identified virus, but do not rule out bacterial infection or co-infection with other pathogens not detected by the test. Clinical correlation with patient history and other diagnostic information is necessary to determine patient infection status. The expected result is Negative.  Fact Sheet for Patients:  Fact Sheet for Healthcare Providers: 02/20/21  This test is not yet approved or cleared by the 2253 FDA and  has been authorized for detection and/or diagnosis of SARS-CoV-2 by FDA under an Emergency Use Authorization (EUA).  This EUA will remain in effect (meaning  this test c an be used) for the duration of  the COVID-19 declaration under Section 564(b)(1) of the Act, 21 U.S.C. section 360bbb-3(b)(1), unless the authorization is terminated or revoked sooner.     Influenza A by PCR NEGATIVE NEGATIVE Final   Influenza B by PCR NEGATIVE NEGATIVE Final    Comment: (NOTE) The Xpert Xpress SARS-CoV-2/FLU/RSV plus assay is intended as an aid in the diagnosis of influenza from Nasopharyngeal swab specimens and should not be used as a sole basis for treatment. Nasal washings and aspirates are unacceptable for Xpert Xpress SARS-CoV-2/FLU/RSV testing.  Fact Sheet for Patients: BloggerCourse.com  Fact Sheet for Healthcare Providers: SeriousBroker.it  This test is not yet approved or cleared by the [place] FDA and has been authorized for detection and/or diagnosis of SARS-CoV-2 by FDA under an Emergency Use Authorization (EUA). This EUA will remain in effect (meaning this test can be used) for the duration of the COVID-19 declaration under Section 564(b)(1) of the Act, 21 U.S.C. section 360bbb-3(b)(1), unless the authorization is terminated or revoked.  Performed at  [facility], [address] [place], [place] [address]          Radiology Studies: No results found.      Scheduled Meds:  enoxaparin (LOVENOX) injection  40 mg Subcutaneous Q24H   escitalopram  10 mg Oral Daily   feeding supplement  237 mL Oral TID BM   folic acid  1 mg Oral Daily   gabapentin  300 mg Oral TID   hydrocortisone  10 mg Oral QPM   hydrocortisone  20 mg Oral Daily   multivitamin with minerals  1 tablet Oral Daily   nicotine  21 mg Transdermal Daily   pantoprazole  40 mg Oral BID   sodium chloride flush  3 mL Intravenous Q12H   thiamine  100 mg Oral Daily   Or   thiamine  100 mg Intravenous Daily   thiamine  100 mg Oral Daily   traZODone  150 mg Oral QHS   Continuous Infusions:   LOS: 6 days    Time spent: 15 mins     Charise Killian, MD [facility] Pager [phone]  If 7PM-7AM, please contact night-coverage 12/28/2020, 2:47 PM

## 2020-12-28 NOTE — TOC Progression Note (Signed)
Transition of Care [facility]) - Progression Note    Patient Details  Name: Chad [name] MRN: [id number] Date of Birth: 04/30/1950  Transition of Care [facility] CM/SW Contact  Margarito Liner, LCSW Phone Number: 12/28/2020, 9:21 AM  Clinical Narrative:  [employer] admissions coordinator will start insurance authorization.   Expected Discharge Plan: Skilled Nursing Facility Barriers to Discharge: Other (must enter comment) (COVID Isolation)  Expected Discharge Plan and Services Expected Discharge Plan: Skilled Nursing Facility     Post Acute Care Choice: Skilled Nursing Facility Living arrangements for the past 2 months: Single Family Home                                       Social Determinants of Health (SDOH) Interventions    Readmission Risk Interventions No flowsheet data found.

## 2020-12-28 NOTE — Progress Notes (Signed)
Nutrition Follow Up Note   DOCUMENTATION CODES:   Non-severe (moderate) malnutrition in context of social or environmental circumstances  INTERVENTION:   Ensure Enlive po TID, each supplement provides 350 kcal and 20 grams of protein  Magic cup TID with meals, each supplement provides 290 kcal and 9 grams of protein  MVI, folic acid and thiamine po daily   NUTRITION DIAGNOSIS:   Moderate Malnutrition related to social / environmental circumstances (etoh abuse) as evidenced by moderate fat depletion, severe muscle depletion. -new diagnoses   GOAL:   Patient will meet greater than or equal to 90% of their needs -progressing                                                                                                                                                                                                                                                                                                                                                                                                                                                                                                                                                                                                                                                                                                                                                                                                                                                                                                                                                                                                                                                                                                                                                                                                                                                                                                                                                                                                                                                                                                                                                                                                                                                                                                                                                                                                                                                                                                                                                                                                                                                                                                                                                                                                                                                                                                                                                                                                                                                                                                                                                                                                                                                                                                                                                                                                                                                                                                                                                                                                                                                                                                                                                                                                                                                                                                                                                                                                                                                                                                                                                                                                                                                                                                                                                                                                                                                                                                                                                                                                                                                                                                                                                                                                                                                                                                                                                                                                                                                                                                                                                                                                                                                                                                                          MONITOR:   PO intake, Supplement acceptance, Labs, Skin, Weight trends, I & O's  ASSESSMENT:   70-year-old male who presented to the ED with [place] [employer] on 9/02 as voluntary psych evaluation and with chest pain and nausea. PMH of anxiety, MDD, BPH, DVT, CKD, GERD, HTN, EtOH abuse. Pt tested positive for COVID-19.  Met with pt in room today. Pt reports good appetite and oral intake pta and in hospital. RD suspects pt with poor oral intake at baseline r/t etoh abuse. Pt reports eating 75-100% of his meals in hospital. Pt reports that he loves vanilla Ensure and he drinks this at home. RD will add supplements to help pt meet his estimated needs. Pt is  receiving Magic Cups on his meal trays. Per chart, pt appears weight stable since admission.   Medications reviewed and include: lovenox, folic acid, cortef, MVI, nicotine, protonix, thiamine  Labs reviewed: Na 134(L), K 4.2 wnl, creat 0.54(L) P 4.1- 9/4  NUTRITION - FOCUSED PHYSICAL EXAM:  Flowsheet Row Most Recent Value  Orbital Region Moderate depletion  Upper Arm Region Mild depletion  Thoracic and Lumbar Region Moderate depletion  Buccal Region Moderate depletion  Temple Region Severe depletion  Clavicle Bone Region Severe depletion  Clavicle and Acromion Bone Region Severe depletion  Scapular Bone Region Severe depletion  Dorsal Hand Moderate depletion  Patellar Region Severe depletion  Anterior Thigh Region Severe depletion  Posterior Calf Region Severe depletion  Edema (RD Assessment) None  Hair Reviewed  Eyes Reviewed  Mouth Reviewed  Skin Reviewed  Nails Reviewed   Diet Order:   Diet Order             Diet regular Room service appropriate? Yes; Fluid consistency: Thin  Diet effective now                  EDUCATION NEEDS:   Education needs have been addressed  Skin:  Skin Assessment: Reviewed RN Assessment  Last BM:  9/8- type 5  Height:   Ht Readings from Last 1 Encounters:  12/21/20 5' 8" (1.727 m)    Weight:   Wt Readings from Last 1 Encounters:  12/28/20 72.5 kg    BMI:  Body mass index is 24.31 kg/m.  Estimated Nutritional Needs:   Kcal:  1900-2200kcal/day  Protein:  95-110 grams  Fluid:  >/= 1.8 L    MS, RD, LDN Please refer to AMION for RD and/or RD on-call/weekend/after hours pager  

## 2020-12-28 NOTE — Progress Notes (Addendum)
Occupational Therapy Treatment Patient Details Name: Chad [name] MRN: [id number] DOB: 10/08/1950 Today's Date: 12/28/2020    History of present illness Hoy Fallert is a 70yoM who comes [facility] 12/21/20 via [name] [employer]. Police contacted after neighbors found patient to not be managing his exrement in an appropriate manner. Pt found to have (+) COVID test. Psych is following. PMH: ETOH abuse, MDD, BPH, HTN, DVT, acute encephalopathy.   OT comments  Mr Raz was seen for OT treatment on this date. Upon arrival to room pt reclined in bed, agreeable to tx. Pt perseverative on finding his shoes this session. MIN A for LB access seated EOB. MIN A + RW for ADL t/f - assist to stabilize RW. Pt tolerated ~30 ft mobility x2 reps with seated rest breaks. Pt completed seated exercises as described below. Pt making good progress toward goals. Pt continues to benefit from skilled OT services to maximize return to PLOF and minimize risk of future falls, injury, caregiver burden, and readmission. Will continue to follow POC. Discharge recommendation remains appropriate.    Follow Up Recommendations  SNF    Equipment Recommendations  Other (comment) (defer to next venue of care)    Recommendations for Other Services      Precautions / Restrictions Precautions Precautions: Fall Restrictions Weight Bearing Restrictions: No       Mobility Bed Mobility Overal bed mobility: Needs Assistance Bed Mobility: Supine to Sit;Sit to Supine     Supine to sit: Supervision Sit to supine: Supervision        Transfers Overall transfer level: Needs assistance Equipment used: Rolling walker (2 wheeled) Transfers: Sit to/from Stand Sit to Stand: Min assist         General transfer comment: to stabilize RW    Balance Overall balance assessment: Needs assistance Sitting-balance support: Feet supported;No upper extremity supported Sitting balance-Leahy Scale: Fair Sitting balance - Comments: posterior  lean   Standing balance support: Single extremity supported;During functional activity Standing balance-Leahy Scale: Fair                             ADL either performed or assessed with clinical judgement   ADL Overall ADL's : Needs assistance/impaired                                       General ADL Comments: MIN A for LB access seated EOB. MIN A + RW for ADL t/f - assist to stabilize RW.      Cognition Arousal/Alertness: Awake/alert Behavior During Therapy: WFL for tasks assessed/performed;Impulsive;Agitated;Anxious Overall Cognitive Status: No family/caregiver present to determine baseline cognitive functioning                                 General Comments: poor safety awareness        Exercises Exercises: Other exercises;General Upper Extremity;General Lower Extremity General Exercises - Upper Extremity Shoulder Flexion: AROM;Strengthening;Both;10 reps;Seated Shoulder Extension: AROM;Strengthening;Both;10 reps;Seated Shoulder Horizontal ABduction: AROM;Strengthening;Both;10 reps;Seated Shoulder Horizontal ADduction: AROM;Strengthening;Both;10 reps;Seated Elbow Flexion: AROM;Strengthening;Both;10 reps;Seated Elbow Extension: AROM;Strengthening;Both;10 reps;Seated Chair Push Up: AROM;Strengthening;Both;10 reps;Seated General Exercises - Lower Extremity Gluteal Sets: AROM;Strengthening;Both;10 reps;Seated Long Arc Quad: AROM;Strengthening;Both;10 reps;Seated Hip ABduction/ADduction: AROM;Strengthening;Both;10 reps;Seated Hip Flexion/Marching: AROM;Strengthening;Both;10 reps;Seated Other Exercises Other Exercises: Pt educated re: HEP, falls prevention, ECS Other Exercises: sup<>sit, sit<>stand, sitting/standing blanace/tolerance, LBD, ~30  ft mobility x2 rounds    Pertinent Vitals/ Pain       Pain Assessment: No/denies pain   Frequency  Min 2X/week        Progress Toward Goals  OT Goals(current goals can now be  found in the care plan section)  Progress towards OT goals: Progressing toward goals  Acute Rehab OT Goals Patient Stated Goal: to feel better OT Goal Formulation: With patient Time For Goal Achievement: 01/06/21 ADL Goals Pt Will Perform Grooming: with min guard assist;standing Pt Will Perform Lower Body Dressing: with min guard assist;sit to/from stand Pt Will Transfer to Toilet: with min guard assist;ambulating;regular height toilet  Plan Discharge plan remains appropriate;Frequency remains appropriate    Co-evaluation                 AM-PAC OT "6 Clicks" Daily Activity     Outcome Measure   Help from another person eating meals?: None Help from another person taking care of personal grooming?: A Little Help from another person toileting, which includes using toliet, bedpan, or urinal?: A Lot Help from another person bathing (including washing, rinsing, drying)?: A Lot Help from another person to put on and taking off regular upper body clothing?: A Little Help from another person to put on and taking off regular lower body clothing?: A Lot 6 Click Score: 16    End of Session Equipment Utilized During Treatment: Rolling walker  OT Visit Diagnosis: Unsteadiness on feet (R26.81);History of falling (Z91.81);Muscle weakness (generalized) (M62.81)   Activity Tolerance Patient tolerated treatment well   Patient Left in bed;with call bell/phone within reach;with bed alarm set   Nurse Communication          Time: 1359-1420 OT Time Calculation (min): 21 min  Charges: OT General Charges $OT Visit: 1 Visit OT Treatments $Therapeutic Exercise: 8-22 mins  Kathie Dike, M.S. OTR/L  12/28/20, 2:39 PM  ascom [phone]

## 2020-12-29 LAB — CBC
HCT: 34.1 % — ABNORMAL LOW (ref 39.0–52.0)
Hemoglobin: 11 g/dL — ABNORMAL LOW (ref 13.0–17.0)
MCH: 31.7 pg (ref 26.0–34.0)
MCHC: 32.3 g/dL (ref 30.0–36.0)
MCV: 98.3 fL (ref 80.0–100.0)
Platelets: 199 10*3/uL (ref 150–400)
RBC: 3.47 MIL/uL — ABNORMAL LOW (ref 4.22–5.81)
RDW: 16.4 % — ABNORMAL HIGH (ref 11.5–15.5)
WBC: 7.1 10*3/uL (ref 4.0–10.5)
nRBC: 0 % (ref 0.0–0.2)

## 2020-12-29 LAB — BASIC METABOLIC PANEL
Anion gap: 5 (ref 5–15)
BUN: 18 mg/dL (ref 8–23)
CO2: 28 mmol/L (ref 22–32)
Calcium: 9 mg/dL (ref 8.9–10.3)
Chloride: 103 mmol/L (ref 98–111)
Creatinine, Ser: 0.65 mg/dL (ref 0.61–1.24)
GFR, Estimated: >60 mL/min (ref >60)
Glucose, Bld: 140 mg/dL — ABNORMAL HIGH (ref 70–99)
Potassium: 4.1 mmol/L (ref 3.5–5.1)
Sodium: 136 mmol/L (ref 135–145)

## 2020-12-29 NOTE — Progress Notes (Signed)
PROGRESS NOTE    Chad [name]  ONG:[id number] DOB: 03-16-1951 DOA: 12/21/2020 PCP: Rayetta Humphrey, MD   Assessment & Plan:   Principal Problem:   Moderate recurrent major depression (HCC) Active Problems:   HTN (hypertension)   Alcohol withdrawal (HCC)   DVT (deep venous thrombosis) (HCC)   Alcohol dependence with uncomplicated withdrawal (HCC)   Alcohol-induced mood disorder (HCC)   BPH (benign prostatic hyperplasia)   Wernicke's encephalopathy   Physical deconditioning   COVID-19   Malnutrition of moderate degree   COVID19: completed paxlovid course. Continue on airborne and contact precautions   Alcohol withdrawal: CIWA protocol was d/c as pt was no longer scoring on the protocol   Alcohol abuse disorder: continue on gabapentin, thiamine, folate. Alcohol cessation counseling   Depression: severity unknown. Continue on lexapro, trazodone. 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 Continue on PPI    Hypokalemia: WNL today   Abnormal ECG: New T wave invesions seen on ECG.  Had some chest discomfort but trops here normal.  Chest pain resolved.  Echo obtained and showed normal EF, grade I DD, normal valves, no effusion, no RWMA.  Likely pleuritic pain.      Transaminitis:resolved   Thrombocytopenia: resolved   Moderate malnutrition: continue on nutritional supplements   DVT prophylaxis: lovenox  Code Status: full  Family Communication: Disposition Plan: SNF on 01/01/21  Level of care: Med-Surg  Status is: Inpatient  Remains inpatient appropriate because:Unsafe d/c plan, IV treatments appropriate due to intensity of illness or inability to take PO, and Inpatient level of care appropriate due to severity of illness, waiting SNF placement   Dispo: The patient is from:  Home              Anticipated d/c is to: SNF               Patient currently medically stable for d/c to SNF   Difficult to place patient : unclear         Consultants:   Procedures:  \ Antimicrobials:    Subjective: Pt denies any complaints  Objective: Vitals:   12/28/20 1557 12/28/20 2001 12/29/20 0507 12/29/20 0816  BP: 123/69 122/76 121/76 (!) 119/97  Pulse: 77 73 68 62  Resp: 19 16 16 16   Temp: (!) 97.4 F (36.3 C) 97.8 F (36.6 C) 97.7 F (36.5 C) 97.7 F (36.5 C)  TempSrc: Oral Oral Oral Oral  SpO2: 96% 96% 95% 94%  Weight:      Height:        Intake/Output Summary (Last 24 hours) at 12/29/2020 0821 Last data filed at 12/29/2020 0600 Gross per 24 hour  Intake --  Output 1075 ml  Net -1075 ml   Filed Weights   12/21/20 1947 12/21/20 2006 12/28/20 1312  Weight: 72.6 kg 72.6 kg 72.5 kg    Examination:  General exam: Appears comfortable. Disheveled  Respiratory system: Clear breath sounds b/l  Cardiovascular system: S1/S2+. No rubs or clicks Gastrointestinal system: Abd is soft, NT, ND & normal bowel sounds  Central nervous system: Alert and oriented. Moves all extremities  Psychiatry: judgement and insight appear normal. Flat mood and affect    Data Reviewed: I have personally reviewed following labs and imaging studies  CBC: Recent Labs  Lab 12/23/20 0443 12/24/20 0500  12/27/20 0542 12/28/20 0818 12/29/20 0450  WBC 4.0 5.2 6.4 6.7 7.1  HGB 11.4* 11.4* 11.3* 12.1* 11.0*  HCT 33.7* 34.3* 34.9* 36.5* 34.1*  MCV 94.9 94.0 96.9 96.3 98.3  PLT 140* 128* 135* 186 199   Basic Metabolic Panel: Recent Labs  Lab 12/23/20 0443 12/24/20 0500 12/25/20 0552 12/26/20 0420 12/27/20 0542 12/28/20 0818 12/29/20 0450  NA 138   < > 139 139 137 134* 136  K 3.6   < > 4.4 4.1 4.0 4.2 4.1  CL 105   < > 108 109 104 98 103  CO2 26   < > 25 24 25 27 28   GLUCOSE 112*   < > 112* 89 92 92 140*  BUN 6*   < > 11 11 14 13 18   CREATININE 0.65   < > 0.52*  0.55* 0.60* 0.54* 0.65  CALCIUM 8.4*   < > 8.6* 8.5* 8.7* 9.3 9.0  MG 1.8  --   --   --   --   --   --   PHOS 4.1  --   --   --   --   --   --    < > = values in this interval not displayed.   GFR: Estimated Creatinine Clearance: 85.5 mL/min (by C-G formula based on SCr of 0.65 mg/dL). Liver Function Tests: Recent Labs  Lab 12/23/20 0443 12/24/20 0500 12/25/20 0552 12/26/20 0420 12/27/20 0542  AST 64* 34 24 20 20   ALT 27 19 18 16 16   ALKPHOS 98 88 79 75 65  BILITOT 1.1 1.1 0.5 0.5 0.5  PROT 5.5* 5.6* 5.4* 5.7* 5.7*  ALBUMIN 2.8* 2.9* 2.8* 2.8* 2.8*   No results for input(s): LIPASE, AMYLASE in the last 168 hours. No results for input(s): AMMONIA in the last 168 hours. Coagulation Profile: No results for input(s): INR, PROTIME in the last 168 hours. Cardiac Enzymes: No results for input(s): CKTOTAL, CKMB, CKMBINDEX, TROPONINI in the last 168 hours. BNP (last 3 results) No results for input(s): PROBNP in the last 8760 hours. HbA1C: No results for input(s): HGBA1C in the last 72 hours. CBG: No results for input(s): GLUCAP in the last 168 hours. Lipid Profile: No results for input(s): CHOL, HDL, LDLCALC, TRIG, CHOLHDL, LDLDIRECT in the last 72 hours. Thyroid Function Tests: No results for input(s): TSH, T4TOTAL, FREET4, T3FREE, THYROIDAB in the last 72 hours. Anemia Panel: No results for input(s): VITAMINB12, FOLATE, FERRITIN, TIBC, IRON, RETICCTPCT in the last 72 hours. Sepsis Labs: No results for input(s): PROCALCITON, LATICACIDVEN in the last 168 hours.  Recent Results (from the past 240 hour(s))  Resp Panel by RT-PCR (Flu A&B, Covid) Nasopharyngeal Swab     Status: Abnormal   Collection Time: 12/21/20  9:42 PM   Specimen: Nasopharyngeal Swab; Nasopharyngeal(NP) swabs in vial transport medium  Result Value Ref Range Status   SARS Coronavirus 2 by RT PCR POSITIVE (A) NEGATIVE Final    Comment: RESULT CALLED TO, READ BACK BY AND VERIFIED WITH: BRANDON NORRIS AT 2252 ON  12/21/20 BY SS (NOTE) SARS-CoV-2 target nucleic acids are DETECTED.  The SARS-CoV-2 RNA is generally detectable in upper respiratory specimens during the acute phase of infection. Positive results are indicative of the presence of the identified virus, but do not rule out bacterial infection or co-infection with other pathogens not detected by the test. Clinical correlation with patient history and other diagnostic information is necessary to determine patient infection status. The expected result is Negative.  Fact Sheet  for Patients: BloggerCourse.com  Fact Sheet for Healthcare Providers: SeriousBroker.it  This test is not yet approved or cleared by the [place] FDA and  has been authorized for detection and/or diagnosis of SARS-CoV-2 by FDA under an Emergency Use Authorization (EUA).  This EUA will remain in effect (meaning this test c an be used) for the duration of  the COVID-19 declaration under Section 564(b)(1) of the Act, 21 U.S.C. section 360bbb-3(b)(1), unless the authorization is terminated or revoked sooner.     Influenza A by PCR NEGATIVE NEGATIVE Final   Influenza B by PCR NEGATIVE NEGATIVE Final    Comment: (NOTE) The Xpert Xpress SARS-CoV-2/FLU/RSV plus assay is intended as an aid in the diagnosis of influenza from Nasopharyngeal swab specimens and should not be used as a sole basis for treatment. Nasal washings and aspirates are unacceptable for Xpert Xpress SARS-CoV-2/FLU/RSV testing.  Fact Sheet for Patients: BloggerCourse.com  Fact Sheet for Healthcare Providers: SeriousBroker.it  This test is not yet approved or cleared by the [place] FDA and has been authorized for detection and/or diagnosis of SARS-CoV-2 by FDA under an Emergency Use Authorization (EUA). This EUA will remain in effect (meaning this test can be used) for the duration of  the COVID-19 declaration under Section 564(b)(1) of the Act, 21 U.S.C. section 360bbb-3(b)(1), unless the authorization is terminated or revoked.  Performed at [facility], [address] [place], [place] [address]          Radiology Studies: No results found.      Scheduled Meds:  enoxaparin (LOVENOX) injection  40 mg Subcutaneous Q24H   escitalopram  10 mg Oral Daily   feeding supplement  237 mL Oral TID BM   folic acid  1 mg Oral Daily   gabapentin  300 mg Oral TID   hydrocortisone  10 mg Oral QPM   hydrocortisone  20 mg Oral Daily   multivitamin with minerals  1 tablet Oral Daily   nicotine  21 mg Transdermal Daily   pantoprazole  40 mg Oral BID   sodium chloride flush  3 mL Intravenous Q12H   thiamine  100 mg Oral Daily   traZODone  150 mg Oral QHS   Continuous Infusions:   LOS: 7 days    Time spent: 15 mins     Charise Killian, MD [facility] Pager [phone]  If 7PM-7AM, please contact night-coverage 12/29/2020, 8:21 AM

## 2020-12-30 LAB — CBC
HCT: 34.1 % — ABNORMAL LOW (ref 39.0–52.0)
Hemoglobin: 11 g/dL — ABNORMAL LOW (ref 13.0–17.0)
MCH: 31.3 pg (ref 26.0–34.0)
MCHC: 32.3 g/dL (ref 30.0–36.0)
MCV: 97.2 fL (ref 80.0–100.0)
Platelets: 229 10*3/uL (ref 150–400)
RBC: 3.51 MIL/uL — ABNORMAL LOW (ref 4.22–5.81)
RDW: 16.1 % — ABNORMAL HIGH (ref 11.5–15.5)
WBC: 7.1 10*3/uL (ref 4.0–10.5)
nRBC: 0 % (ref 0.0–0.2)

## 2020-12-30 LAB — BASIC METABOLIC PANEL
Anion gap: 6 (ref 5–15)
BUN: 24 mg/dL — ABNORMAL HIGH (ref 8–23)
CO2: 28 mmol/L (ref 22–32)
Calcium: 8.8 mg/dL — ABNORMAL LOW (ref 8.9–10.3)
Chloride: 105 mmol/L (ref 98–111)
Creatinine, Ser: 0.65 mg/dL (ref 0.61–1.24)
GFR, Estimated: >60 mL/min (ref >60)
Glucose, Bld: 133 mg/dL — ABNORMAL HIGH (ref 70–99)
Potassium: 4.3 mmol/L (ref 3.5–5.1)
Sodium: 139 mmol/L (ref 135–145)

## 2020-12-30 NOTE — Progress Notes (Signed)
PROGRESS NOTE    Chad [name]  CHE:[id number] DOB: [birth year] DOA: 12/21/2020 PCP: Rayetta Humphrey, MD   Assessment & Plan:   Principal Problem:   Moderate recurrent major depression (HCC) Active Problems:   HTN (hypertension)   Alcohol withdrawal (HCC)   DVT (deep venous thrombosis) (HCC)   Alcohol dependence with uncomplicated withdrawal (HCC)   Alcohol-induced mood disorder (HCC)   BPH (benign prostatic hyperplasia)   Wernicke's encephalopathy   Physical deconditioning   COVID-19   Malnutrition of moderate degree   COVID19: completed paxlovid course. Continue on airborne & contact precautions   Alcohol withdrawal: resolved  Alcohol abuse disorder: continue on folate, thiamine & gabapentin. Received alcohol cessation counseling    Depression: severity unknown. Continue on lexapro, trazodone. 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 Continue on PPI    Hypokalemia: resolved  Abnormal ECG: New T wave invesions seen on ECG.  Had some chest discomfort but trops here normal.  Chest pain resolved.  Echo obtained and showed normal EF, grade I DD, normal valves, no effusion, no RWMA.  Likely pleuritic pain.      Transaminitis: resolved   Thrombocytopenia: resolved   Moderate malnutrition: continue on nutritional supplement   DVT prophylaxis: lovenox  Code Status: full  Family Communication: Disposition Plan: SNF on 01/01/21  Level of care: Med-Surg  Status is: Inpatient  Remains inpatient appropriate because:Unsafe d/c plan, IV treatments appropriate due to intensity of illness or inability to take PO, and Inpatient level of care appropriate due to severity of illness, waiting SNF placement   Dispo: The patient is from: Home              Anticipated d/c is to: SNF                Patient currently medically stable for d/c to SNF   Difficult to place patient : unclear         Consultants:   Procedures:  \ Antimicrobials:    Subjective: Pt c/o malaise   Objective: Vitals:   12/29/20 0816 12/29/20 1620 12/29/20 1942 12/30/20 0437  BP: (!) 119/97 (!) 143/79 126/74 113/68  Pulse: 62 100 88 66  Resp: 16 16 18 16   Temp: 97.7 F (36.5 C) 97.6 F (36.4 C) 97.7 F (36.5 C) 97.6 F (36.4 C)  TempSrc: Oral Oral Oral Oral  SpO2: 94% 98% 97% 96%  Weight:    70 kg  Height:        Intake/Output Summary (Last 24 hours) at 12/30/2020 0749 Last data filed at 12/30/2020 0100 Gross per 24 hour  Intake --  Output 2600 ml  Net -2600 ml   Filed Weights   12/21/20 2006 12/28/20 1312 12/30/20 0437  Weight: 72.6 kg 72.5 kg 70 kg    Examination:  General exam: Appears calm & comfortable.  Respiratory system: clear breath sounds b/l  Cardiovascular system: S1 & S2+. No rubs or clicks  Gastrointestinal system: Abd is soft, NT, ND & hypoactive bowel sounds   Central nervous system: Alert and oriented. Moves all extremities  Psychiatry: judgement and insight appear normal. Flat mood and affect     Data Reviewed: I have personally reviewed following labs and imaging studies  CBC: Recent Labs  Lab 12/24/20 0500 12/27/20 0542 12/28/20 0818 12/29/20 0450  12/30/20 0439  WBC 5.2 6.4 6.7 7.1 7.1  HGB 11.4* 11.3* 12.1* 11.0* 11.0*  HCT 34.3* 34.9* 36.5* 34.1* 34.1*  MCV 94.0 96.9 96.3 98.3 97.2  PLT 128* 135* 186 199 229   Basic Metabolic Panel: Recent Labs  Lab 12/26/20 0420 12/27/20 0542 12/28/20 0818 12/29/20 0450 12/30/20 0439  NA 139 137 134* 136 139  K 4.1 4.0 4.2 4.1 4.3  CL 109 104 98 103 105  CO2 24 25 27 28 28   GLUCOSE 89 92 92 140* 133*  BUN 11 14 13 18  24*  CREATININE 0.55* 0.60* 0.54* 0.65 0.65  CALCIUM 8.5* 8.7* 9.3 9.0 8.8*   GFR: Estimated Creatinine Clearance: 85.5 mL/min (by C-G formula based on SCr of 0.65  mg/dL). Liver Function Tests: Recent Labs  Lab 12/24/20 0500 12/25/20 0552 12/26/20 0420 12/27/20 0542  AST 34 24 20 20   ALT 19 18 16 16   ALKPHOS 88 79 75 65  BILITOT 1.1 0.5 0.5 0.5  PROT 5.6* 5.4* 5.7* 5.7*  ALBUMIN 2.9* 2.8* 2.8* 2.8*   No results for input(s): LIPASE, AMYLASE in the last 168 hours. No results for input(s): AMMONIA in the last 168 hours. Coagulation Profile: No results for input(s): INR, PROTIME in the last 168 hours. Cardiac Enzymes: No results for input(s): CKTOTAL, CKMB, CKMBINDEX, TROPONINI in the last 168 hours. BNP (last 3 results) No results for input(s): PROBNP in the last 8760 hours. HbA1C: No results for input(s): HGBA1C in the last 72 hours. CBG: No results for input(s): GLUCAP in the last 168 hours. Lipid Profile: No results for input(s): CHOL, HDL, LDLCALC, TRIG, CHOLHDL, LDLDIRECT in the last 72 hours. Thyroid Function Tests: No results for input(s): TSH, T4TOTAL, FREET4, T3FREE, THYROIDAB in the last 72 hours. Anemia Panel: No results for input(s): VITAMINB12, FOLATE, FERRITIN, TIBC, IRON, RETICCTPCT in the last 72 hours. Sepsis Labs: No results for input(s): PROCALCITON, LATICACIDVEN in the last 168 hours.  Recent Results (from the past 240 hour(s))  Resp Panel by RT-PCR (Flu A&B, Covid) Nasopharyngeal Swab     Status: Abnormal   Collection Time: 12/21/20  9:42 PM   Specimen: Nasopharyngeal Swab; Nasopharyngeal(NP) swabs in vial transport medium  Result Value Ref Range Status   SARS Coronavirus 2 by RT PCR POSITIVE (A) NEGATIVE Final    Comment: RESULT CALLED TO, READ BACK BY AND VERIFIED WITH: BRANDON NORRIS AT 2252 ON 12/21/20 BY SS (NOTE) SARS-CoV-2 target nucleic acids are DETECTED.  The SARS-CoV-2 RNA is generally detectable in upper respiratory specimens during the acute phase of infection. Positive results are indicative of the presence of the identified virus, but do not rule out bacterial infection or co-infection with other  pathogens not detected by the test. Clinical correlation with patient history and other diagnostic information is necessary to determine patient infection status. The expected result is Negative.  Fact Sheet for Patients:  Fact Sheet for Healthcare Providers: 02/20/21  This test is not yet approved or cleared by the [place] FDA and  has been authorized for detection and/or diagnosis of SARS-CoV-2 by FDA under an Emergency Use Authorization (EUA).  This EUA will remain in effect (meaning this test c an be used) for the duration of  the COVID-19 declaration under Section 564(b)(1) of the Act, 21 U.S.C. section 360bbb-3(b)(1), unless the authorization is terminated or revoked sooner.     Influenza A by PCR NEGATIVE NEGATIVE Final   Influenza B by PCR NEGATIVE NEGATIVE Final    Comment: (NOTE) The  Xpert Xpress SARS-CoV-2/FLU/RSV plus assay is intended as an aid in the diagnosis of influenza from Nasopharyngeal swab specimens and should not be used as a sole basis for treatment. Nasal washings and aspirates are unacceptable for Xpert Xpress SARS-CoV-2/FLU/RSV testing.  Fact Sheet for Patients: BloggerCourse.com  Fact Sheet for Healthcare Providers: SeriousBroker.it  This test is not yet approved or cleared by the [place] FDA and has been authorized for detection and/or diagnosis of SARS-CoV-2 by FDA under an Emergency Use Authorization (EUA). This EUA will remain in effect (meaning this test can be used) for the duration of the COVID-19 declaration under Section 564(b)(1) of the Act, 21 U.S.C. section 360bbb-3(b)(1), unless the authorization is terminated or revoked.  Performed at [facility], [address], [place], [place] [address]          Radiology Studies: No results found.      Scheduled Meds:  enoxaparin  (LOVENOX) injection  40 mg Subcutaneous Q24H   escitalopram  10 mg Oral Daily   feeding supplement  237 mL Oral TID BM   folic acid  1 mg Oral Daily   gabapentin  300 mg Oral TID   hydrocortisone  10 mg Oral QPM   hydrocortisone  20 mg Oral Daily   multivitamin with minerals  1 tablet Oral Daily   nicotine  21 mg Transdermal Daily   pantoprazole  40 mg Oral BID   sodium chloride flush  3 mL Intravenous Q12H   thiamine  100 mg Oral Daily   traZODone  150 mg Oral QHS   Continuous Infusions:   LOS: 8 days    Time spent: 15 mins     Charise Killian, MD [facility] Pager [phone]  If 7PM-7AM, please contact night-coverage 12/30/2020, 7:49 AM

## 2020-12-31 LAB — BASIC METABOLIC PANEL
Anion gap: 7 (ref 5–15)
BUN: 18 mg/dL (ref 8–23)
CO2: 27 mmol/L (ref 22–32)
Calcium: 8.8 mg/dL — ABNORMAL LOW (ref 8.9–10.3)
Chloride: 103 mmol/L (ref 98–111)
Creatinine, Ser: 0.62 mg/dL (ref 0.61–1.24)
GFR, Estimated: >60 mL/min (ref >60)
Glucose, Bld: 114 mg/dL — ABNORMAL HIGH (ref 70–99)
Potassium: 4.4 mmol/L (ref 3.5–5.1)
Sodium: 137 mmol/L (ref 135–145)

## 2020-12-31 LAB — CBC
HCT: 35 % — ABNORMAL LOW (ref 39.0–52.0)
Hemoglobin: 11.2 g/dL — ABNORMAL LOW (ref 13.0–17.0)
MCH: 31.2 pg (ref 26.0–34.0)
MCHC: 32 g/dL (ref 30.0–36.0)
MCV: 97.5 fL (ref 80.0–100.0)
Platelets: 247 10*3/uL (ref 150–400)
RBC: 3.59 MIL/uL — ABNORMAL LOW (ref 4.22–5.81)
RDW: 16.1 % — ABNORMAL HIGH (ref 11.5–15.5)
WBC: 6.6 10*3/uL (ref 4.0–10.5)
nRBC: 0 % (ref 0.0–0.2)

## 2020-12-31 NOTE — Progress Notes (Signed)
Patient declining to have bed alarm on.

## 2020-12-31 NOTE — Progress Notes (Signed)
Occupational Therapy Treatment Patient Details Name: Chad [name] MRN: [id number] DOB: 04-23-1950 Today's Date: 12/31/2020   History of present illness Chad [name] is a 70yoM who comes to [facility]21[facility]20 via [name] [employer]. Police contacted after neighbors found patient to not be managing his exrement in an appropriate manner. Pt found to have (+) COVID test. Psych is following. PMH: ETOH abuse, MDD, BPH, HTN, DVT, acute encephalopathy.   OT comments  Chad [name] did well today. He was much more alert and engaged compared to his rehab sessions last week, and was steadier on his feet, able to complete bed mobility, transfers, UB and LB dressing, grooming with Mod I-SUPV. Displayed good safety awareness with ambulation and transfers. When asked by therapist what topics were of concern to him, pt independently stated that "I need to improve my nutrition" and then later said, "I know I need to stop drinking. I've been trying to cut back." We discussed possibility of signing up for Meals on Wheels, which would provide one healthy meal a day, a little social interaction, and a safety check. Also discussed getting involved with an AA group. Therapist is doubtful that Chad [name] would be able to establish either of those connections on his own, but hopeful that social worker at next venue of care could assist pt with putting those pieces into place prior to DC. This may reduce need for future hospital re-admission.    Recommendations for follow up therapy are one component of a multi-disciplinary discharge planning process, led by the attending physician.  Recommendations may be updated based on patient status, additional functional criteria and insurance authorization.    Follow Up Recommendations  SNF    Equipment Recommendations  Other (comment) (eyeglasses repaired or replaced)    Recommendations for Other Services      Precautions / Restrictions Precautions Precautions: Fall Restrictions Weight  Bearing Restrictions: No       Mobility Bed Mobility Overal bed mobility: Needs Assistance Bed Mobility: Supine to Sit     Supine to sit: Modified independent (Device/Increase time)     General bed mobility comments: required a bit extra time, but pt had essentially no other difficulty w/ bed mobility and transfers this AM    Transfers Overall transfer level: Needs assistance Equipment used: Rolling walker (2 wheeled) Transfers: Sit to/from Stand Sit to Stand: Modified independent (Device/Increase time) Stand pivot transfers: Supervision       General transfer comment: good job standing and transferring, including maneuvering around bedside table    Balance Overall balance assessment: Needs assistance Sitting-balance support: Feet supported;No upper extremity supported Sitting balance-Leahy Scale: Good     Standing balance support: Bilateral upper extremity supported;During functional activity Standing balance-Leahy Scale: Good                             ADL either performed or assessed with clinical judgement   ADL Overall ADL's : Needs assistance/impaired Eating/Feeding: Modified independent   Grooming: Wash/dry hands;Wash/dry face;Set up;Modified independent           Upper Body Dressing : Modified independent;Set up Upper Body Dressing Details (indicate cue type and reason): doffing/donning gown Lower Body Dressing: Modified independent;Bed level Lower Body Dressing Details (indicate cue type and reason): donning socks               General ADL Comments: SUPV + RW for ADL t/f     Vision   Additional Comments: pt continues to  be missing lens on L side of eyeglasses   Perception     Praxis      Cognition Arousal/Alertness: Awake/alert Behavior During Therapy: WFL for tasks assessed/performed;Impulsive;Agitated;Anxious Overall Cognitive Status: No family/caregiver present to determine baseline cognitive functioning                                  General Comments: fairly alert and talkative today        Exercises Other Exercises Other Exercises: Discussion re: seeking social supports, improved nutrition, reducing alcohol consumption. Educ re: Meals on Wheels, [place].   Shoulder Instructions       General Comments      Pertinent Vitals/ Pain       Pain Assessment: No/denies pain Pain Score: 7  Pain Location: hips/shoulders/abdomen Pain Intervention(s): Patient requesting pain meds-RN notified;Monitored during session;Limited activity within patient's tolerance;Repositioned  Home Living                                          Prior Functioning/Environment              Frequency  Min 2X/week        Progress Toward Goals  OT Goals(current goals can now be found in the care plan section)  Progress towards OT goals: Progressing toward goals  Acute Rehab OT Goals Patient Stated Goal: to feel better OT Goal Formulation: With patient Time For Goal Achievement: 01/06/21  Plan Discharge plan remains appropriate;Frequency remains appropriate    Co-evaluation                 AM-PAC OT "6 Clicks" Daily Activity     Outcome Measure   Help from another person eating meals?: None Help from another person taking care of personal grooming?: A Little Help from another person toileting, which includes using toliet, bedpan, or urinal?: A Lot Help from another person bathing (including washing, rinsing, drying)?: A Little Help from another person to put on and taking off regular upper body clothing?: None Help from another person to put on and taking off regular lower body clothing?: A Little 6 Click Score: 19    End of Session Equipment Utilized During Treatment: Rolling walker  OT Visit Diagnosis: Unsteadiness on feet (R26.81);History of falling (Z91.81);Muscle weakness (generalized) (M62.81)   Activity Tolerance Patient tolerated treatment well   Patient  Left in chair;with call bell/phone within reach;with nursing/sitter in room   Nurse Communication Patient requests pain meds        Time: 1020-1034 OT Time Calculation (min): 14 min  Charges: OT General Charges $OT Visit: 1 Visit OT Treatments $Self Care/Home Management : 8-22 mins  Latina Craver, PhD, MS, OTR/L 12/31/20, 10:49 AM

## 2020-12-31 NOTE — TOC Progression Note (Signed)
Transition of Care [facility]) - Progression Note    Patient Details  Name: Chad [name] MRN: [id number] Date of Birth: 1950-12-31  [facility]) CM/SW Contact  Margarito Liner, LCSW Phone Number: 12/31/2020, 12:57 PM  Clinical Narrative:  Insurance authorization still pending. Sent updated therapy notes to [facility] admissions coordinator.   Expected Discharge Plan: Skilled Nursing Facility Barriers to Discharge: Other (must enter comment) (COVID Isolation)  Expected Discharge Plan and Services Expected Discharge Plan: Skilled Nursing Facility     Post Acute Care Choice: Skilled Nursing Facility Living arrangements for the past 2 months: Single Family Home                                       Social Determinants of Health (SDOH) Interventions    Readmission Risk Interventions No flowsheet data found.

## 2020-12-31 NOTE — Progress Notes (Signed)
PROGRESS NOTE    Chad [name]  SAY:[id number] DOB: 08-03-1950 DOA: 12/21/2020 PCP: Rayetta Humphrey, MD   Assessment & Plan:   Principal Problem:   Moderate recurrent major depression (HCC) Active Problems:   HTN (hypertension)   Alcohol withdrawal (HCC)   DVT (deep venous thrombosis) (HCC)   Alcohol dependence with uncomplicated withdrawal (HCC)   Alcohol-induced mood disorder (HCC)   BPH (benign prostatic hyperplasia)   Wernicke's encephalopathy   Physical deconditioning   COVID-19   Malnutrition of moderate degree   COVID19: completed paxlovid course. Continue on airborne & contact precautions  Alcohol withdrawal: resolved  Alcohol abuse disorder: continue on thiamine, folate & gabapentin. Received alcohol cessation counseling   Depression: severity unknown. Continue on lexapro, trazodone. 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 Continue on PPI    Hypokalemia: resolved  Abnormal ECG: New T wave invesions seen on ECG.  Had some chest discomfort but trops here normal.  Chest pain resolved.  Echo obtained and showed normal EF, grade I DD, normal valves, no effusion, no RWMA.  Likely pleuritic pain.      Transaminitis: resolved   Thrombocytopenia: resolved   Moderate malnutrition: continue on nutritional supplements  DVT prophylaxis: lovenox  Code Status: full  Family Communication: Disposition Plan: SNF on 01/01/21  Level of care: Med-Surg  Status is: Inpatient  Remains inpatient appropriate because:Unsafe d/c plan, IV treatments appropriate due to intensity of illness or inability to take PO, and Inpatient level of care appropriate due to severity of illness, waiting SNF placement & insurance auth   Dispo: The patient is from: Home              Anticipated d/c is  to: SNF               Patient currently medically stable for d/c to SNF   Difficult to place patient : unclear         Consultants:   Procedures:  \ Antimicrobials:    Subjective: Pt denies any complaints   Objective: Vitals:   12/30/20 1545 12/30/20 1710 12/30/20 2000 12/31/20 0505  BP: 121/71 121/76 118/62 (!) 122/92  Pulse: 75 74 76 79  Resp:  14 18 16   Temp: 97.8 F (36.6 C) 97.6 F (36.4 C) 98 F (36.7 C) 97.9 F (36.6 C)  TempSrc: Oral Oral  Oral  SpO2: 97% 97% 97% 97%  Weight:      Height:        Intake/Output Summary (Last 24 hours) at 12/31/2020 0815 Last data filed at 12/31/2020 0508 Gross per 24 hour  Intake 243 ml  Output 820 ml  Net -577 ml   Filed Weights   12/21/20 2006 12/28/20 1312 12/30/20 0437  Weight: 72.6 kg 72.5 kg 70 kg    Examination:  General exam: Appears comfortable  Respiratory system: clear breath sounds b/l. No wheezes, rales  Cardiovascular system: S1/S2+. No rubs or clicks  Gastrointestinal system: Abd is soft, NT, ND & normal bowel sounds  Central nervous system: Alert and oriented. Moves all extremities Psychiatry: judgement and insight appear normal. Appropriate mood and affect    Data Reviewed: I have personally reviewed following labs and imaging studies  CBC: Recent Labs  Lab 12/27/20 0542 12/28/20 0818 12/29/20 0450 12/30/20 0439 12/31/20 0456  WBC  6.4 6.7 7.1 7.1 6.6  HGB 11.3* 12.1* 11.0* 11.0* 11.2*  HCT 34.9* 36.5* 34.1* 34.1* 35.0*  MCV 96.9 96.3 98.3 97.2 97.5  PLT 135* 186 199 229 247   Basic Metabolic Panel: Recent Labs  Lab 12/27/20 0542 12/28/20 0818 12/29/20 0450 12/30/20 0439 12/31/20 0456  NA 137 134* 136 139 137  K 4.0 4.2 4.1 4.3 4.4  CL 104 98 103 105 103  CO2 25 27 28 28 27   GLUCOSE 92 92 140* 133* 114*  BUN 14 13 18  24* 18  CREATININE 0.60* 0.54* 0.65 0.65 0.62  CALCIUM 8.7* 9.3 9.0 8.8* 8.8*   GFR: Estimated Creatinine Clearance: 85.5 mL/min (by C-G formula based on  SCr of 0.62 mg/dL). Liver Function Tests: Recent Labs  Lab 12/25/20 0552 12/26/20 0420 12/27/20 0542  AST 24 20 20   ALT 18 16 16   ALKPHOS 79 75 65  BILITOT 0.5 0.5 0.5  PROT 5.4* 5.7* 5.7*  ALBUMIN 2.8* 2.8* 2.8*   No results for input(s): LIPASE, AMYLASE in the last 168 hours. No results for input(s): AMMONIA in the last 168 hours. Coagulation Profile: No results for input(s): INR, PROTIME in the last 168 hours. Cardiac Enzymes: No results for input(s): CKTOTAL, CKMB, CKMBINDEX, TROPONINI in the last 168 hours. BNP (last 3 results) No results for input(s): PROBNP in the last 8760 hours. HbA1C: No results for input(s): HGBA1C in the last 72 hours. CBG: No results for input(s): GLUCAP in the last 168 hours. Lipid Profile: No results for input(s): CHOL, HDL, LDLCALC, TRIG, CHOLHDL, LDLDIRECT in the last 72 hours. Thyroid Function Tests: No results for input(s): TSH, T4TOTAL, FREET4, T3FREE, THYROIDAB in the last 72 hours. Anemia Panel: No results for input(s): VITAMINB12, FOLATE, FERRITIN, TIBC, IRON, RETICCTPCT in the last 72 hours. Sepsis Labs: No results for input(s): PROCALCITON, LATICACIDVEN in the last 168 hours.  Recent Results (from the past 240 hour(s))  Resp Panel by RT-PCR (Flu A&B, Covid) Nasopharyngeal Swab     Status: Abnormal   Collection Time: 12/21/20  9:42 PM   Specimen: Nasopharyngeal Swab; Nasopharyngeal(NP) swabs in vial transport medium  Result Value Ref Range Status   SARS Coronavirus 2 by RT PCR POSITIVE (A) NEGATIVE Final    Comment: RESULT CALLED TO, READ BACK BY AND VERIFIED WITH: BRANDON NORRIS AT 2252 ON 12/21/20 BY SS (NOTE) SARS-CoV-2 target nucleic acids are DETECTED.  The SARS-CoV-2 RNA is generally detectable in upper respiratory specimens during the acute phase of infection. Positive results are indicative of the presence of the identified virus, but do not rule out bacterial infection or co-infection with other pathogens not detected  by the test. Clinical correlation with patient history and other diagnostic information is necessary to determine patient infection status. The expected result is Negative.  Fact Sheet for Patients:  Fact Sheet for Healthcare Providers: 02/20/21  This test is not yet approved or cleared by the 2253 FDA and  has been authorized for detection and/or diagnosis of SARS-CoV-2 by FDA under an Emergency Use Authorization (EUA).  This EUA will remain in effect (meaning this test c an be used) for the duration of  the COVID-19 declaration under Section 564(b)(1) of the Act, 21 U.S.C. section 360bbb-3(b)(1), unless the authorization is terminated or revoked sooner.     Influenza A by PCR NEGATIVE NEGATIVE Final   Influenza B by PCR NEGATIVE NEGATIVE Final    Comment: (NOTE) The Xpert Xpress SARS-CoV-2/FLU/RSV plus assay is intended as an aid in the  diagnosis of influenza from Nasopharyngeal swab specimens and should not be used as a sole basis for treatment. Nasal washings and aspirates are unacceptable for Xpert Xpress SARS-CoV-2/FLU/RSV testing.  Fact Sheet for Patients: BloggerCourse.com  Fact Sheet for Healthcare Providers: SeriousBroker.it  This test is not yet approved or cleared by the [place] FDA and has been authorized for detection and/or diagnosis of SARS-CoV-2 by FDA under an Emergency Use Authorization (EUA). This EUA will remain in effect (meaning this test can be used) for the duration of the COVID-19 declaration under Section 564(b)(1) of the Act, 21 U.S.C. section 360bbb-3(b)(1), unless the authorization is terminated or revoked.  Performed at [facility], [address] [place], [place] [address]          Radiology Studies: No results found.      Scheduled Meds:  enoxaparin (LOVENOX) injection  40 mg  Subcutaneous Q24H   escitalopram  10 mg Oral Daily   feeding supplement  237 mL Oral TID BM   folic acid  1 mg Oral Daily   gabapentin  300 mg Oral TID   hydrocortisone  10 mg Oral QPM   hydrocortisone  20 mg Oral Daily   multivitamin with minerals  1 tablet Oral Daily   nicotine  21 mg Transdermal Daily   pantoprazole  40 mg Oral BID   sodium chloride flush  3 mL Intravenous Q12H   thiamine  100 mg Oral Daily   traZODone  150 mg Oral QHS   Continuous Infusions:   LOS: 9 days    Time spent: 15 mins     Charise Killian, MD [facility] Pager [phone]  If 7PM-7AM, please contact night-coverage 12/31/2020, 8:15 AM

## 2020-12-31 NOTE — Progress Notes (Signed)
Physical Therapy Treatment Patient Details Name: Chad [name] MRN: [id number] DOB: 06-16-1950 Today's Date: 12/31/2020   History of Present Illness Chad [name] is a 70yoM who comes [facility] 12/21/20 via [name] [employer]. Police contacted after neighbors found patient to not be managing his excrement in an appropriate manner. Pt found to have (+) COVID test. Psych is following. PMH: ETOH abuse, MDD, BPH, HTN, DVT, acute encephalopathy.    PT Comments    Pt in bed upon entry, reports AMB to [facility] over the weekend for ADL performance. Pt using RW with staff due to ongoing imbalance and limited confidence in unsupported gait. Session focus on balance training. Pt offered minGUard assist for most interventions, but he has poor confidence in author to provide safety support. Pt has limited accuracy in following more complex commands for interventions. He requires breaks intermittently due to fatigue and back pain. Mentation remain improved but with some mild cognitive deficits I suspect are chronic in nature.     Recommendations for follow up therapy are one component of a multi-disciplinary discharge planning process, led by the attending physician.  Recommendations may be updated based on patient status, additional functional criteria and insurance authorization.  Follow Up Recommendations  SNF;Supervision for mobility/OOB     Equipment Recommendations  None recommended by PT    Recommendations for Other Services       Precautions / Restrictions Precautions Precautions: Fall Restrictions Weight Bearing Restrictions: No     Mobility  Bed Mobility Overal bed mobility: Needs Assistance Bed Mobility: Supine to Sit     Supine to sit: Supervision Sit to supine: Supervision   General bed mobility comments: tangled in blanket, but attempts without regard    Transfers Overall transfer level: Needs assistance Equipment used: Rolling walker (2 wheeled) Transfers: Sit to/from Stand Sit to  Stand: Supervision Stand pivot transfers: Supervision       General transfer comment: some gross ataxia/imbalance with these  Ambulation/Gait Ambulation/Gait assistance: Min guard Gait Distance (Feet): 80 Feet Assistive device: None Gait Pattern/deviations: Wide base of support;Ataxic     General Gait Details: doe snot feel comfortable performing without device despite minGuard assist; LOB each time he turns, opts to use UE support rather than to slow down turns   Social research officer, government [employer] (Stroke Patients Only)       Balance Overall balance assessment: Needs assistance Sitting-balance support: Feet supported;No upper extremity supported Sitting balance-Leahy Scale: Good     Standing balance support: Bilateral upper extremity supported;During functional activity Standing balance-Leahy Scale: Good                              Cognition Arousal/Alertness: Awake/alert Behavior During Therapy: WFL for tasks assessed/performed Overall Cognitive Status: No family/caregiver present to determine baseline cognitive functioning                                 General Comments: low frustration tolerance      Exercises Other Exercises Other Exercises: Standing unsupported on foam mat, letter search and tap (at whiteboard) 10x for each index finger Other Exercises: Standing on foam mat, alternate toe taps on bed pan 20x total (reluctant to perform without RUE support on wall; Left gluteal control deficits kicking the bed pan with Rt foot several times accidentallyt. Other  Exercises: 10x STS from elevated EOB, struggles with follows cues as given Other Exercises: normal stance balance on firm surface for urination into toilet x60sec    General Comments        Pertinent Vitals/Pain Pain Assessment: No/denies pain Pain Score: 7  Pain Location: some l,ow back pain with prolonged standing in room, takes seated  breaks Pain Intervention(s): Patient requesting pain meds-RN notified;Monitored during session;Limited activity within patient's tolerance;Repositioned    Home Living                      Prior Function            PT Goals (current goals can now be found in the care plan section) Acute Rehab PT Goals Patient Stated Goal: to feel better PT Goal Formulation: With patient Time For Goal Achievement: 01/07/21 Potential to Achieve Goals: Fair Progress towards PT goals: Progressing toward goals    Frequency    Min 2X/week      PT Plan Current plan remains appropriate    Co-evaluation              AM-PAC PT "6 Clicks" Mobility   Outcome Measure  Help needed turning from your back to your side while in a flat bed without using bedrails?: A Little Help needed moving from lying on your back to sitting on the side of a flat bed without using bedrails?: A Little Help needed moving to and from a bed to a chair (including a wheelchair)?: A Little Help needed standing up from a chair using your arms (e.g., wheelchair or bedside chair)?: A Little Help needed to walk in hospital room?: A Little Help needed climbing 3-5 steps with a railing? : A Lot 6 Click Score: 17    End of Session Equipment Utilized During Treatment: Gait belt Activity Tolerance: Patient tolerated treatment well;Patient limited by fatigue;Patient limited by pain Patient left: in bed;with nursing/sitter in room;with call bell/phone within reach Nurse Communication: Mobility status PT Visit Diagnosis: Difficulty in walking, not elsewhere classified (R26.2);Other abnormalities of gait and mobility (R26.89);Repeated falls (R29.6);Muscle weakness (generalized) (M62.81);Other symptoms and signs involving the nervous system (R29.898);Dizziness and giddiness (R42)     Time: 4696-2952 PT Time Calculation (min) (ACUTE ONLY): 18 min  Charges:  $Neuromuscular Re-education: 8-22 mins                    11:20  AM, 12/31/20 Rosamaria Lints, PT, DPT Physical Therapist - [facility]  [phone] (ASCOM)    Chad [name] C 12/31/2020, 11:18 AM

## 2021-01-01 LAB — BASIC METABOLIC PANEL
Anion gap: 9 (ref 5–15)
BUN: 20 mg/dL (ref 8–23)
CO2: 26 mmol/L (ref 22–32)
Calcium: 9.3 mg/dL (ref 8.9–10.3)
Chloride: 103 mmol/L (ref 98–111)
Creatinine, Ser: 0.66 mg/dL (ref 0.61–1.24)
GFR, Estimated: >60 mL/min (ref >60)
Glucose, Bld: 104 mg/dL — ABNORMAL HIGH (ref 70–99)
Potassium: 4 mmol/L (ref 3.5–5.1)
Sodium: 138 mmol/L (ref 135–145)

## 2021-01-01 LAB — CBC
HCT: 37.1 % — ABNORMAL LOW (ref 39.0–52.0)
Hemoglobin: 11.7 g/dL — ABNORMAL LOW (ref 13.0–17.0)
MCH: 30.7 pg (ref 26.0–34.0)
MCHC: 31.5 g/dL (ref 30.0–36.0)
MCV: 97.4 fL (ref 80.0–100.0)
Platelets: 274 10*3/uL (ref 150–400)
RBC: 3.81 MIL/uL — ABNORMAL LOW (ref 4.22–5.81)
RDW: 16.2 % — ABNORMAL HIGH (ref 11.5–15.5)
WBC: 6.8 10*3/uL (ref 4.0–10.5)
nRBC: 0 % (ref 0.0–0.2)

## 2021-01-01 NOTE — TOC Progression Note (Addendum)
Transition of Care [facility]) - Progression Note    Patient Details  Name: Chad [name] MRN: [id number] Date of Birth: 1950/04/30  [facility] CM/SW Contact  Margarito Liner, LCSW Phone Number: 01/01/2021, 9:28 AM  Clinical Narrative:   Insurance authorization still pending. [facility] admissions coordinator left voicemail for [employer]  12:17 pm: Insurance authorization denied. Admissions coordinator is waiting on call back to see if we can appeal. Patient is aware and agreeable. MD is aware.  3:45 pm: [employer] representative will call back to start appeal process. Reference # [id number].  Expected Discharge Plan: Skilled Nursing Facility Barriers to Discharge: Other (must enter comment) (COVID Isolation)  Expected Discharge Plan and Services Expected Discharge Plan: Skilled Nursing Facility     Post Acute Care Choice: Skilled Nursing Facility Living arrangements for the past 2 months: Single Family Home                                       Social Determinants of Health (SDOH) Interventions    Readmission Risk Interventions No flowsheet data found.

## 2021-01-01 NOTE — Progress Notes (Signed)
PROGRESS NOTE   HPI was taken from Dr. Crissie Reese: Chad [name] is a 70 y.o. male with hx of EtOH use disorder, major depression, BPH, hypertension, DVT, and acute encephalopathy, who presented to the ED with SI and weakness.   Patient presents to the ED last evening reporting SI and chest pain, after his neighbor had called the police department.  Per ED providers report from last night patient was found in poor living conditions, covered in feces.  Psychiatry was consulted, and ultimately deemed him not a risk to self or others and not meeting criteria for psychiatric admission.  After he was cleared by psychiatry he was reevaluated by the ED physician.  He again was incontinent of stool and was barely able to sit up at the side of the bed.  In addition he tested positive for COVID and was deemed unsafe for discharge to home.   On my exam patient seemed disinterested and was a difficult historian.  Offered very little in the way of history, but did agree that he had a lot of difficulty moving around and felt very weak.  He endorsed thinking daily but was not able to quantify it for me, he denied ever having alcohol withdrawal.  He denied any current chest pain or shortness of breath to me.   In the ED vital signs notable for low normal O2 sats in the low 90s, intermittent tachycardia, and mild hypertension.  As previously noted COVID test returned positive.  Blood alcohol level was 250.  Serial troponins over 12 hours apart were negative.  CBC and CMP were largely unremarkable with exception of mildly elevated AST to 59.  UA was unremarkable.  Urine toxicology screen was negative for all tested substances.  No acute findings on chest x-ray.  EKG however was changed compared to prior with new Q waves in the anterolateral leads.   As per Dr. Maryfrances Bunnell: Chad [name] is a 70 y.o. M with AUD, HTN, and DVT no longer on [facility], who presented with suicidal ideation and generalized weakness.     The [name] before  this admission, the patient had presented to the hospital with suicidal ideation chest pain after a neighbor called the police.  Per report, the patient appeared to be covered in feces, when she had poor living conditions.  Psychiatry were consulted, recommended psychiatric treatment but no psychiatric admission that he was not admitted at rest himself, cleared and not eligible for inpatient psychiatric treatment.  As the patient was unable to stand, and appeared incoherent, hospitalists are asked to evaluate.  He subsequently tested positive for COVID.      Hospital course from Dr. Mayford Knife 9/7-9/13/22: Pt was found to have covid19 and was treated w/ paxlovid. Pt has since completed the course. Also, pt was treated for alcohol w/drawal with CIWA protocol and that has since resolved as well. PT/OT evaluated and recommended SNF. Pt was suppose to go to SNF today but pt's Berkley Harvey was denied and CM is potentially working on an appeal.     Chad [name]  PNT:[id number] DOB: Feb 26, 1951 DOA: 12/21/2020 PCP: Rayetta Humphrey, MD   Assessment & Plan:   Principal Problem:   Moderate recurrent major depression (HCC) Active Problems:   HTN (hypertension)   Alcohol withdrawal (HCC)   DVT (deep venous thrombosis) (HCC)   Alcohol dependence with uncomplicated withdrawal (HCC)   Alcohol-induced mood disorder (HCC)   BPH (benign prostatic hyperplasia)   Wernicke's encephalopathy   Physical deconditioning   COVID-19  Malnutrition of moderate degree   COVID19: completed paxlovid course. Continue on airborne & contact precautions  Alcohol withdrawal: resolved  Alcohol abuse disorder: continue on thiamine, folate & gabapentin. Received alcohol cessation counseling    Depression: severity unknown. Continue on lexapro, trazodone. Suicidal ideation & seen by psych who feel he does not meet criteria for inaptient psychiatric treatment, they recommend outpatient follow up   Empty sella syndrome: continue on  home dose of hydrocortisone    PUD: continue on PPI. Hx of bleeding ulcers    Hypokalemia: resolved  Abnormal ECG: New T wave invesions seen on ECG.  Had some chest discomfort but trops here normal.  Chest pain resolved.  Echo obtained and showed normal EF, grade I DD, normal valves, no effusion, no RWMA.  Likely pleuritic pain.      Transaminitis: resolved   Thrombocytopenia: resolved   Moderate malnutrition: continue on nutritional supplements   DVT prophylaxis: lovenox  Code Status: full  Family Communication: Disposition Plan: SNF on 01/01/21  Level of care: Med-Surg  Status is: Inpatient  Remains inpatient appropriate because:Unsafe d/c plan, IV treatments appropriate due to intensity of illness or inability to take PO, and Inpatient level of care appropriate due to severity of illness, auth was denied and CM is working potentially on appeal   Dispo: The patient is from: Home              Anticipated d/c is to: SNF               Patient currently medically stable for d/c to SNF   Difficult to place patient : unclear     Consultants:   Procedures:   Antimicrobials:    Subjective: Pt c/o fatigue   Objective: Vitals:   12/31/20 1156 12/31/20 1547 12/31/20 1931 01/01/21 0518  BP: 126/72 124/71 127/72 112/65  Pulse: 81 75 72 64  Resp: 16 18 20 20   Temp:  98.6 F (37 C) 98 F (36.7 C) (!) 97.5 F (36.4 C)  TempSrc:   Oral Oral  SpO2: 97% 94% 97% 98%  Weight:      Height:        Intake/Output Summary (Last 24 hours) at 01/01/2021 0802 Last data filed at 01/01/2021 01/03/2021 Gross per 24 hour  Intake 360 ml  Output 1575 ml  Net -1215 ml   Filed Weights   12/21/20 2006 12/28/20 1312 12/30/20 0437  Weight: 72.6 kg 72.5 kg 70 kg    Examination:  General exam: Appears calm and comfortable  Respiratory system: clear breath sounds b/l   Cardiovascular system: S1 & S2+. No rubs or clicks  Gastrointestinal system: Abd is soft, NT, ND & hypoactive bowel sounds   Central nervous system: Alert and oriented. Moves all extremities  Psychiatry: judgement and insight appear normal. Appropriate mood and affect    Data Reviewed: I have personally reviewed following labs and imaging studies  CBC: Recent Labs  Lab 12/28/20 0818 12/29/20 0450 12/30/20 0439 12/31/20 0456 01/01/21 0438  WBC 6.7 7.1 7.1 6.6 6.8  HGB 12.1* 11.0* 11.0* 11.2* 11.7*  HCT 36.5* 34.1* 34.1* 35.0* 37.1*  MCV 96.3 98.3 97.2 97.5 97.4  PLT 186 199 229 247 274   Basic Metabolic Panel: Recent Labs  Lab 12/28/20 0818 12/29/20 0450 12/30/20 0439 12/31/20 0456 01/01/21 0438  NA 134* 136 139 137 138  K 4.2 4.1 4.3 4.4 4.0  CL 98 103 105 103 103  CO2 27 28 28 27 26   GLUCOSE 92  140* 133* 114* 104*  BUN 13 18 24* 18 20  CREATININE 0.54* 0.65 0.65 0.62 0.66  CALCIUM 9.3 9.0 8.8* 8.8* 9.3   GFR: Estimated Creatinine Clearance: 85.5 mL/min (by C-G formula based on SCr of 0.66 mg/dL). Liver Function Tests: Recent Labs  Lab 12/26/20 0420 12/27/20 0542  AST 20 20  ALT 16 16  ALKPHOS 75 65  BILITOT 0.5 0.5  PROT 5.7* 5.7*  ALBUMIN 2.8* 2.8*   No results for input(s): LIPASE, AMYLASE in the last 168 hours. No results for input(s): AMMONIA in the last 168 hours. Coagulation Profile: No results for input(s): INR, PROTIME in the last 168 hours. Cardiac Enzymes: No results for input(s): CKTOTAL, CKMB, CKMBINDEX, TROPONINI in the last 168 hours. BNP (last 3 results) No results for input(s): PROBNP in the last 8760 hours. HbA1C: No results for input(s): HGBA1C in the last 72 hours. CBG: No results for input(s): GLUCAP in the last 168 hours. Lipid Profile: No results for input(s): CHOL, HDL, LDLCALC, TRIG, CHOLHDL, LDLDIRECT in the last 72 hours. Thyroid Function Tests: No results for input(s): TSH, T4TOTAL, FREET4, T3FREE, THYROIDAB in the last 72 hours. Anemia Panel: No results for input(s): VITAMINB12, FOLATE, FERRITIN, TIBC, IRON, RETICCTPCT in the last 72  hours. Sepsis Labs: No results for input(s): PROCALCITON, LATICACIDVEN in the last 168 hours.  No results found for this or any previous visit (from the past 240 hour(s)).        Radiology Studies: No results found.      Scheduled Meds:  enoxaparin (LOVENOX) injection  40 mg Subcutaneous Q24H   escitalopram  10 mg Oral Daily   feeding supplement  237 mL Oral TID BM   folic acid  1 mg Oral Daily   gabapentin  300 mg Oral TID   hydrocortisone  10 mg Oral QPM   hydrocortisone  20 mg Oral Daily   multivitamin with minerals  1 tablet Oral Daily   nicotine  21 mg Transdermal Daily   pantoprazole  40 mg Oral BID   sodium chloride flush  3 mL Intravenous Q12H   thiamine  100 mg Oral Daily   traZODone  150 mg Oral QHS   Continuous Infusions:   LOS: 10 days    Time spent: 15 mins     Charise Killian, MD [facility] Pager [phone]  If 7PM-7AM, please contact night-coverage 01/01/2021, 8:02 AM

## 2021-01-02 DIAGNOSIS — R531 Weakness: Secondary | ICD-10-CM

## 2021-01-02 DIAGNOSIS — F32A Depression, unspecified: Secondary | ICD-10-CM

## 2021-01-02 DIAGNOSIS — E440 Moderate protein-calorie malnutrition: Secondary | ICD-10-CM

## 2021-01-02 LAB — CBC
HCT: 34.4 % — ABNORMAL LOW (ref 39.0–52.0)
Hemoglobin: 11.2 g/dL — ABNORMAL LOW (ref 13.0–17.0)
MCH: 32.1 pg (ref 26.0–34.0)
MCHC: 32.6 g/dL (ref 30.0–36.0)
MCV: 98.6 fL (ref 80.0–100.0)
Platelets: 264 10*3/uL (ref 150–400)
RBC: 3.49 MIL/uL — ABNORMAL LOW (ref 4.22–5.81)
RDW: 16.4 % — ABNORMAL HIGH (ref 11.5–15.5)
WBC: 7.3 10*3/uL (ref 4.0–10.5)
nRBC: 0 % (ref 0.0–0.2)

## 2021-01-02 NOTE — Plan of Care (Signed)
  Problem: Clinical Measurements: Goal: Ability to maintain clinical measurements within normal limits will improve Outcome: Progressing Goal: Will remain free from infection Outcome: Progressing Goal: Diagnostic test results will improve Outcome: Progressing Goal: Respiratory complications will improve Outcome: Progressing Goal: Cardiovascular complication will be avoided Outcome: Progressing   Problem: Pain Managment: Goal: General experience of comfort will improve Outcome: Progressing   Pt is involved in and agrees with the plan of care. V/S stable. Reports pain on his hip; oxycodone given. Slept well all night.

## 2021-01-02 NOTE — Progress Notes (Signed)
Physical Therapy Treatment Patient Details Name: Chad [name] MRN: [id number] DOB: January 01, 1951 Today's Date: 01/02/2021   History of Present Illness Chad [name] is a 70yoM who comes to [facility] 12/21/20 via [name]. Police contacted after neighbors found patient to not be managing his excrement in an appropriate manner. Pt found to have (+) COVID test. Psych is following. PMH: ETOH abuse, MDD, BPH, HTN, DVT, acute encephalopathy.    PT Comments    Pt is well known by author from previous admissions. He is A and O x 4 and agreeable to PT session. He was able to exit bed, stand, and ambulate 200 ft without AD however requires CGA throughout for safety. He needs several standing rest due to fatigue/hip pain. Will greatly benefit from  rehab at DC to assist pt with returning to PLOF. Acute PT will continue to follow and progress as able per current POC.   Recommendations for follow up therapy are one component of a multi-disciplinary discharge planning process, led by the attending physician.  Recommendations may be updated based on patient status, additional functional criteria and insurance authorization.  Follow Up Recommendations  SNF;Supervision for mobility/OOB     Equipment Recommendations  None recommended by PT       Precautions / Restrictions Precautions Precautions: Fall Restrictions Weight Bearing Restrictions: No     Mobility  Bed Mobility Overal bed mobility: Needs Assistance Bed Mobility: Supine to Sit     Supine to sit: Supervision Sit to supine: Supervision        Transfers Overall transfer level: Needs assistance Equipment used: None Transfers: Sit to/from Stand Sit to Stand: Min guard         General transfer comment: CGA for safety however after first STS was able to perform STS with supervision only  Ambulation/Gait Ambulation/Gait assistance: Min guard Gait Distance (Feet): 200 Feet Assistive device: None Gait Pattern/deviations: Antalgic;Wide  base of support Gait velocity: WNL   General Gait Details: pt cotninues to have antalgic gait pattern due to hip pain. Did walk 200 ft however required 3 standing rest due to pain     Balance Overall balance assessment: Needs assistance Sitting-balance support: Feet supported;No upper extremity supported Sitting balance-Leahy Scale: Good     Standing balance support: Bilateral upper extremity supported;During functional activity Standing balance-Leahy Scale: Good      Cognition Arousal/Alertness: Awake/alert Behavior During Therapy: WFL for tasks assessed/performed Overall Cognitive Status: Within Functional Limits for tasks assessed      General Comments: Pt is A and O x 4. agrees to session and was cooperative throughout. requested to ambulate without AD this session.             Pertinent Vitals/Pain Pain Assessment: 0-10 Pain Score: 4  Faces Pain Scale: Hurts a little bit Pain Location: hip Pain Descriptors / Indicators: Discomfort;Dull;Grimacing Pain Intervention(s): Limited activity within patient's tolerance;Monitored during session;Repositioned     PT Goals (current goals can now be found in the care plan section) Acute Rehab PT Goals Patient Stated Goal: to feel better Progress towards PT goals: Progressing toward goals    Frequency    Min 2X/week      PT Plan Current plan remains appropriate       AM-PAC PT "6 Clicks" Mobility   Outcome Measure  Help needed turning from your back to your side while in a flat bed without using bedrails?: A Little Help needed moving from lying on your back to sitting on the side of a flat bed  without using bedrails?: A Little Help needed moving to and from a bed to a chair (including a wheelchair)?: A Little Help needed standing up from a chair using your arms (e.g., wheelchair or bedside chair)?: A Little Help needed to walk in hospital room?: A Little Help needed climbing 3-5 steps with a railing? : A Lot 6  Click Score: 17    End of Session Equipment Utilized During Treatment: Gait belt Activity Tolerance: Patient tolerated treatment well;Patient limited by fatigue;Patient limited by pain Patient left: in bed;with nursing/sitter in room;with call bell/phone within reach Nurse Communication: Mobility status PT Visit Diagnosis: Difficulty in walking, not elsewhere classified (R26.2);Other abnormalities of gait and mobility (R26.89);Repeated falls (R29.6);Muscle weakness (generalized) (M62.81);Other symptoms and signs involving the nervous system (R29.898);Dizziness and giddiness (R42)     Time: 1320-1340 PT Time Calculation (min) (ACUTE ONLY): 20 min  Charges:  $Gait Training: 8-22 mins                    Jetta Lout PTA 01/02/21, 1:47 PM

## 2021-01-02 NOTE — Progress Notes (Signed)
Patient ID: Chad [name], male   DOB: [birth year], 70 y.o.   MRN: [id number] [facility] PROGRESS NOTE  Chad [name] WPY:[id number] DOB: [birth year] DOA: 12/21/2020 PCP: Rayetta Humphrey, MD  HPI/Subjective: Patient states he was doing well until he got COVID.  He has been feeling very weak.  He was found in poor living conditions and in feces.  Patient is starting to feel better now.  Objective: Vitals:   01/02/21 0712 01/02/21 1511  BP: 140/88 119/68  Pulse: 65 74  Resp: 16 18  Temp: (!) 97.5 F (36.4 C) 97.8 F (36.6 C)  SpO2: 97% 95%    Intake/Output Summary (Last 24 hours) at 01/02/2021 1636 Last data filed at 01/02/2021 0556 Gross per 24 hour  Intake 3 ml  Output 300 ml  Net -297 ml   Filed Weights   12/21/20 2006 12/28/20 1312 12/30/20 0437  Weight: 72.6 kg 72.5 kg 70 kg    ROS: Review of Systems  Respiratory:  Negative for shortness of breath.   Cardiovascular:  Negative for chest pain.  Gastrointestinal:  Negative for abdominal pain, nausea and vomiting.  Exam: Physical Exam HENT:     Head: Normocephalic.     Mouth/Throat:     Pharynx: No oropharyngeal exudate.  Eyes:     General: Lids are normal.     Conjunctiva/sclera: Conjunctivae normal.  Cardiovascular:     Rate and Rhythm: Normal rate and regular rhythm.     Heart sounds: Normal heart sounds, S1 normal and S2 normal.  Pulmonary:     Breath sounds: Normal breath sounds. No decreased breath sounds, wheezing, rhonchi or rales.  Abdominal:     Palpations: Abdomen is soft.     Tenderness: There is no abdominal tenderness.  Musculoskeletal:     Right lower leg: No swelling.     Left lower leg: No swelling.  Skin:    General: Skin is warm.     Findings: No rash.  Neurological:     Mental Status: He is alert and oriented to person, place, and time.      Scheduled Meds:  enoxaparin (LOVENOX) injection  40 mg Subcutaneous Q24H   escitalopram  10 mg Oral Daily   feeding supplement  237 mL  Oral TID BM   folic acid  1 mg Oral Daily   gabapentin  300 mg Oral TID   hydrocortisone  10 mg Oral QPM   hydrocortisone  20 mg Oral Daily   multivitamin with minerals  1 tablet Oral Daily   nicotine  21 mg Transdermal Daily   pantoprazole  40 mg Oral BID   sodium chloride flush  3 mL Intravenous Q12H   thiamine  100 mg Oral Daily   traZODone  150 mg Oral QHS     Assessment/Plan:  COVID-19 infection.  Patient now out of isolation.  Completed Paxlovid. Alcohol abuse with alcohol withdrawal.  Completed withdrawal protocol.  On thiamine and folic acid and gabapentin. 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  ACTH level low normal range. Peptic ulcer disease on PPI with history of bleeding ulcers. Transaminitis resolved Thrombocytopenia improved Weakness.  Physical therapy still recommending rehab.  Patient lives alone and has had numerous hospitalizations because of inability to take care of himself. Malnutrition of moderate degree   Code Status:     Code Status Orders  (From admission, onward)           Start  Ordered   12/22/20 1603  Full code  Continuous        12/22/20 1602           Code Status History     Date Active Date Inactive Code Status Order ID Comments User Context   12/21/2020 2139 12/22/2020 1602 Full Code [id number]  Minna Antis, MD ED   09/27/2020 1026 10/02/2020 2148 Full Code [id number]  Lucile Shutters, MD ED   08/01/2020 1655 08/14/2020 1847 Full Code [id number]  Eugenie Norrie, NP ED   05/02/2020 0058 05/03/2020 2039 Full Code [id number]  Briscoe Deutscher, MD ED   02/04/2020 1814 02/10/2020 2052 Full Code [id number]  Lurene Shadow, MD ED   01/14/2020 0247 01/19/2020 2357 Full Code [id number]  Mansy, Vernetta Honey, MD ED   01/14/2020 0144 01/14/2020 0247 Full Code [id number]  Nita Sickle, MD ED   12/31/2019 1631 01/06/2020 2346 Full Code [id number]  Gertha Calkin, MD ED   12/09/2019  1729 12/12/2019 1712 Full Code [id number]  Alford Highland, MD ED   02/20/2019 0944 02/20/2019 1734 Full Code [id number]  Chesley Noon, MD ED   01/23/2019 1648 01/25/2019 1957 Full Code [id number]  Mayo, Allyn Kenner, MD Inpatient   05/17/2018 2327 05/18/2018 1902 Full Code [id number]  Oralia Manis, MD Inpatient   05/17/2018 1950 05/17/2018 2327 Full Code [id number]  Nita Sickle, MD ED   12/24/2017 0803 12/25/2017 1645 Full Code [id number]  Arnaldo Natal, MD ED   06/07/2015 1619 06/08/2015 1754 Full Code [id number]  Shirlean Kelly, MD Inpatient      Disposition Plan: Status is: Inpatient  Dispo: The patient is from: Home              Anticipated d/c is to: Rehab              Patient currently medically stable to go out to rehab once insurance company approves   Difficult to place patient.  Hopefully not  Time spent: 26 minutes  Chad [name] [employer]

## 2021-01-02 NOTE — TOC Progression Note (Signed)
Transition of Care [facility]) - Progression Note    Patient Details  Name: Chad [name] MRN: [id number] Date of Birth: [birth year]  [facility]) CM/SW Contact  Margarito Liner, LCSW Phone Number: 01/02/2021, 10:00 AM  Clinical Narrative:  Started appeal process and faxed clinicals to [facility] for review. Turnaround time is 72 hours. Patient and SNF admissions coordinator are aware.   Expected Discharge Plan: Skilled Nursing Facility Barriers to Discharge: Other (must enter comment) (COVID Isolation)  Expected Discharge Plan and Services Expected Discharge Plan: Skilled Nursing Facility     Post Acute Care Choice: Skilled Nursing Facility Living arrangements for the past 2 months: Single Family Home                                       Social Determinants of Health (SDOH) Interventions    Readmission Risk Interventions No flowsheet data found.

## 2021-01-03 DIAGNOSIS — E2749 Other adrenocortical insufficiency: Secondary | ICD-10-CM

## 2021-01-03 NOTE — Plan of Care (Signed)
Pt is alert and oriented x 4. Med compliant. Follow commands. Refuses bed alarm and fall mat. Pt has received 2 prn doses of norco this shift. Vitals stable. Will continue to monitor.  Problem: Education: Goal: Knowledge of General Education information will improve Description: Including pain rating scale, medication(s)/side effects and non-pharmacologic comfort measures Outcome: Progressing   Problem: Health Behavior/Discharge Planning: Goal: Ability to manage health-related needs will improve Outcome: Progressing   Problem: Clinical Measurements: Goal: Ability to maintain clinical measurements within normal limits will improve Outcome: Progressing Goal: Will remain free from infection Outcome: Progressing Goal: Diagnostic test results will improve Outcome: Progressing Goal: Respiratory complications will improve Outcome: Progressing Goal: Cardiovascular complication will be avoided Outcome: Progressing   Problem: Activity: Goal: Risk for activity intolerance will decrease Outcome: Progressing   Problem: Nutrition: Goal: Adequate nutrition will be maintained Outcome: Progressing   Problem: Coping: Goal: Level of anxiety will decrease Outcome: Progressing   Problem: Elimination: Goal: Will not experience complications related to bowel motility Outcome: Progressing Goal: Will not experience complications related to urinary retention Outcome: Progressing   Problem: Pain Managment: Goal: General experience of comfort will improve Outcome: Progressing   Problem: Safety: Goal: Ability to remain free from injury will improve Outcome: Progressing   Problem: Skin Integrity: Goal: Risk for impaired skin integrity will decrease Outcome: Progressing   Problem: Education: Goal: Knowledge of risk factors and measures for prevention of condition will improve Outcome: Progressing   Problem: Coping: Goal: Psychosocial and spiritual needs will be supported Outcome:  Progressing   Problem: Respiratory: Goal: Will maintain a patent airway Outcome: Progressing Goal: Complications related to the disease process, condition or treatment will be avoided or minimized Outcome: Progressing

## 2021-01-03 NOTE — Progress Notes (Signed)
Patient ID: Chad [name], male   DOB: 1950-10-24, 70 y.o.   MRN: [id number] [facility] PROGRESS NOTE  London Tarnowski [id number] DOB: [birth year] DOA: 12/21/2020 PCP: Rayetta Humphrey, MD  HPI/Subjective: Patient starting to feel little bit stronger.  Still complains of left hip pain.  He states he was doing well until he got COVID.  Patient found to be living in poor living conditions and in feces.  Objective: Vitals:   01/03/21 0417 01/03/21 0756  BP: 132/78 126/68  Pulse: 76 (!) 56  Resp: 16   Temp: 98.6 F (37 C) 97.7 F (36.5 C)  SpO2: 98% 93%    Intake/Output Summary (Last 24 hours) at 01/03/2021 1441 Last data filed at 01/03/2021 1300 Gross per 24 hour  Intake 240 ml  Output --  Net 240 ml   Filed Weights   12/28/20 1312 12/30/20 0437 01/03/21 0959  Weight: 72.5 kg 70 kg 75.3 kg    ROS: Review of Systems  Respiratory:  Negative for shortness of breath.   Cardiovascular:  Negative for chest pain.  Gastrointestinal:  Negative for abdominal pain, nausea and vomiting.  Exam: Physical Exam HENT:     Head: Normocephalic.     Mouth/Throat:     Pharynx: No oropharyngeal exudate.  Eyes:     General: Lids are normal.     Conjunctiva/sclera: Conjunctivae normal.  Cardiovascular:     Rate and Rhythm: Normal rate and regular rhythm.     Heart sounds: Normal heart sounds, S1 normal and S2 normal.  Pulmonary:     Breath sounds: No decreased breath sounds, wheezing, rhonchi or rales.  Abdominal:     Palpations: Abdomen is soft.     Tenderness: There is no abdominal tenderness.  Musculoskeletal:     Right lower leg: No swelling.     Left lower leg: No swelling.  Skin:    General: Skin is warm.     Findings: No rash.  Neurological:     Mental Status: He is alert and oriented to person, place, and time.      Scheduled Meds:  enoxaparin (LOVENOX) injection  40 mg Subcutaneous Q24H   escitalopram  10 mg Oral Daily   feeding supplement  237 mL Oral TID BM    folic acid  1 mg Oral Daily   gabapentin  300 mg Oral TID   hydrocortisone  10 mg Oral QPM   hydrocortisone  20 mg Oral Daily   multivitamin with minerals  1 tablet Oral Daily   nicotine  21 mg Transdermal Daily   pantoprazole  40 mg Oral BID   sodium chloride flush  3 mL Intravenous Q12H   thiamine  100 mg Oral Daily   traZODone  150 mg Oral QHS    Assessment/Plan:  COVID-19 infection.  Patient out of isolation.  Completed Paxlovid Weakness.  Physical therapy recommending rehab.  Patient found in unkempt living conditions.  Awaiting insurance authorization Alcohol abuse with alcohol withdrawal.  Completed withdrawal protocol.  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  History of bleeding ulcers Transaminitis resolved Thrombocytopenia.  Improved Moderate malnutrition     Code Status:     Code Status Orders  (From admission, onward)           Start     Ordered   12/22/20 1603  Full code  Continuous  12/22/20 1602           Code Status History     Date Active Date Inactive Code Status Order ID Comments User Context   12/21/2020 2139 12/22/2020 1602 Full Code [id number]  Minna Antis, MD ED   09/27/2020 1026 10/02/2020 2148 Full Code [id number]  Lucile Shutters, MD ED   08/01/2020 1655 08/14/2020 1847 Full Code [id number]  Eugenie Norrie, NP ED   05/02/2020 0058 05/03/2020 2039 Full Code [id number]  Briscoe Deutscher, MD ED   02/04/2020 1814 02/10/2020 2052 Full Code [id number]  Lurene Shadow, MD ED   01/14/2020 0247 01/19/2020 2357 Full Code [id number]  Mansy, Vernetta Honey, MD ED   01/14/2020 0144 01/14/2020 0247 Full Code [id number]  Nita Sickle, MD ED   12/31/2019 1631 01/06/2020 2346 Full Code [id number]  Gertha Calkin, MD ED   12/09/2019 1729 12/12/2019 1712 Full Code [id number]  Alford Highland, MD ED   02/20/2019 0944 02/20/2019  1734 Full Code [id number]  Chesley Noon, MD ED   01/23/2019 1648 01/25/2019 1957 Full Code [id number]  Mayo, Allyn Kenner, MD Inpatient   05/17/2018 2327 05/18/2018 1902 Full Code [id number]  Oralia Manis, MD Inpatient   05/17/2018 1950 05/17/2018 2327 Full Code [id number]  Nita Sickle, MD ED   12/24/2017 0803 12/25/2017 1645 Full Code [id number]  Arnaldo Natal, MD ED   06/07/2015 1619 06/08/2015 1754 Full Code [id number]  Shirlean Kelly, MD Inpatient      Family Communication: Spoke with niece on the phone Disposition Plan: Status is: Inpatient  Dispo: The patient is from: Home              Anticipated d/c is to: Rehab once insurance company authorizes              Patient currently medically stable to go out to rehab once insurance company authorizes   Difficult to place patient.  Hopefully not  Time spent: 26 minutes  Devyn Sheerin [employer]

## 2021-01-03 NOTE — Plan of Care (Signed)
  Problem: Health Behavior/Discharge Planning: Goal: Ability to manage health-related needs will improve Outcome: Progressing   Problem: Clinical Measurements: Goal: Ability to maintain clinical measurements within normal limits will improve Outcome: Completed/Met Goal: Will remain free from infection Outcome: Completed/Met Goal: Diagnostic test results will improve Outcome: Completed/Met Goal: Respiratory complications will improve Outcome: Completed/Met

## 2021-01-03 NOTE — TOC Progression Note (Addendum)
Transition of Care [facility]) - Progression Note    Patient Details  Name: Chad [name] MRN: [id number] Date of Birth: 10-07-1950  [facility]) CM/SW Contact  Margarito Liner, LCSW Phone Number: 01/03/2021, 11:41 AM  Clinical Narrative:  Received call from [facility] confirming he was still hospitalized. They will call Saturday with a decision. Gave them phone number for [employer], weekend [facility] team member.   12:29 pm: Received call from Juliette Alcide, Tax adviser with [employer]. She is requesting updated PT/OT notes. Faxed yesterday's PT note to her. Sent secure chat to OT assigned to see if she could work with him today.  Expected Discharge Plan: Skilled Nursing Facility Barriers to Discharge: Other (must enter comment) (COVID Isolation)  Expected Discharge Plan and Services Expected Discharge Plan: Skilled Nursing Facility     Post Acute Care Choice: Skilled Nursing Facility Living arrangements for the past 2 months: Single Family Home                                       Social Determinants of Health (SDOH) Interventions    Readmission Risk Interventions No flowsheet data found.

## 2021-01-03 NOTE — Progress Notes (Signed)
Nutrition Follow Up Note   DOCUMENTATION CODES:   Non-severe (moderate) malnutrition in context of social or environmental circumstances  INTERVENTION:   Ensure Enlive po TID, each supplement provides 350 kcal and 20 grams of protein  Magic cup TID with meals, each supplement provides 290 kcal and 9 grams of protein  MVI, folic acid and thiamine po daily   NUTRITION DIAGNOSIS:   Moderate Malnutrition related to social / environmental circumstances (etoh abuse) as evidenced by moderate fat depletion, severe muscle depletion. -new diagnoses   GOAL:   Patient will meet greater than or equal to 90% of their needs -progressing                                                                                                                                                                                                                                                                                                                                                                                                                                                                                                                                                                                                                                                                                                                                                                                                                                                                                                                                                                                                                                                                                                                                                                                                                                                                                                                                                                                                                                                                                                                                                                                                                                                                                                                                                                                                                                                                                                                                                                                                                                                                                                                                                                                                                                                                                                                                                                                                                                                                                                                                                                                                                                                                                                                                                                                                                                                                                                                                                                                                                                                                                                                                                                                                                                                                                                                                                                                                                                                                                                                                                                                                                                                                                                                                                                                                                                                                                                                                                                                                                                                                                                                                                                                                                                                                                                                                                                                                                                                                                                                                                                                                                                                                                                                          MONITOR:   PO intake, Supplement acceptance, Labs, Skin, Weight trends, I & O's  ASSESSMENT:   70 year old male who presented to the ED with [name] [employer] on 9/02 as voluntary psych evaluation and with chest pain and nausea. PMH of anxiety, MDD, BPH, DVT, CKD, GERD, HTN, EtOH abuse. Pt tested positive for COVID-19.  Pt reports that he is feeling better today. Pt eating 100% of meals in hospital and is drinking his Ensure supplements and eating his Magic Cups. Per chart, pt down ~5lbs since admission but has not been weighed since 9/11; RD will request weekly weights. Plan is for SNF at discharge.   Medications reviewed and include: lovenox, folic  acid, cortef, MVI, nicotine, protonix, thiamine  Labs reviewed: K 4.0 wnl  Diet Order:    Diet Order             Diet regular Room service appropriate? Yes; Fluid consistency: Thin  Diet effective now                  EDUCATION NEEDS:   Education needs have been addressed  Skin:  Skin Assessment: Reviewed RN Assessment  Last BM:  9/14  Height:   Ht Readings from Last 1 Encounters:  12/21/20 5\' 8"  (1.727 m)    Weight:   Wt Readings from Last 1 Encounters:  12/30/20 70 kg    BMI:  Body mass index is 23.46 kg/m.  Estimated Nutritional Needs:   Kcal:  1900-2200kcal/day  Protein:  95-110 grams  Fluid:  >/= 1.8 L  03/01/21 MS, RD, LDN Please refer to [facility] for RD and/or RD on-call/weekend/after hours pager

## 2021-01-04 MED ORDER — HYDROXYZINE HCL 25 MG PO TABS: 25.0000 mg | ORAL_TABLET | Freq: Three times a day (TID) | ORAL | 0 refills | Status: DC | PRN

## 2021-01-04 MED ORDER — ENSURE ENLIVE PO LIQD: 237.0000 mL | Freq: Three times a day (TID) | ORAL | 12 refills | Status: DC

## 2021-01-04 MED ORDER — HYDROCODONE-ACETAMINOPHEN 5-325 MG PO TABS: 1.0000 | ORAL_TABLET | Freq: Four times a day (QID) | ORAL | 0 refills | Status: DC | PRN

## 2021-01-04 MED ORDER — PANTOPRAZOLE SODIUM 40 MG PO TBEC: 40.0000 mg | DELAYED_RELEASE_TABLET | Freq: Every day | ORAL | 0 refills | Status: DC

## 2021-01-04 MED ORDER — GABAPENTIN 300 MG PO CAPS: 300.0000 mg | ORAL_CAPSULE | Freq: Three times a day (TID) | ORAL | 0 refills | Status: DC

## 2021-01-04 MED ORDER — TRAZODONE HCL 150 MG PO TABS
150.0000 mg | ORAL_TABLET | Freq: Every day | ORAL | 0 refills | Status: DC
Start: 2021-01-04 — End: 2021-02-27

## 2021-01-04 NOTE — Progress Notes (Signed)
Physical Therapy Treatment Patient Details Name: Chad [name] MRN: [id number] DOB: [birth year] Today's Date: 01/04/2021   History of Present Illness Chad [name] is a 70yoM who comes [facility] 12/21/20 via [name] [employer]. Police contacted after neighbors found patient to not be managing his excrement in an appropriate manner. Pt found to have (+) COVID test. Psych is following. PMH: ETOH abuse, MDD, BPH, HTN, DVT, acute encephalopathy.    PT Comments    Pt making progress towards therapy goals. Pt continues to demonstrate decreased balance during static and dynamic standing. Pt ambulated 200 ft in hallway with CGA with increased lateral sway and intermittent high guard arm position to improve balance. Pt requires standing rest breaks during ambulation trial due to fatigue and increased L hip pain. Pt will continue to benefit from skilled PT services to improve balance and promote independence.   Recommendations for follow up therapy are one component of a multi-disciplinary discharge planning process, led by the attending physician.  Recommendations may be updated based on patient status, additional functional criteria and insurance authorization.  Follow Up Recommendations  SNF;Supervision for mobility/OOB     Equipment Recommendations  None recommended by PT    Recommendations for Other Services       Precautions / Restrictions Precautions Precautions: Fall Restrictions Weight Bearing Restrictions: No     Mobility  Bed Mobility Overal bed mobility: Modified Independent Bed Mobility: Supine to Sit     Supine to sit: Modified independent (Device/Increase time) Sit to supine: Modified independent (Device/Increase time)   General bed mobility comments: Additional time    Transfers Overall transfer level: Needs assistance Equipment used: Rolling walker (2 wheeled) Transfers: Sit to/from Stand Sit to Stand: Min guard         General transfer comment: CGA for standing at EOB  without AD  Ambulation/Gait Ambulation/Gait assistance: Min guard Gait Distance (Feet): 200 Feet Assistive device: None Gait Pattern/deviations: Antalgic;Wide base of support Gait velocity: Slightly decreased   General Gait Details: Increased lateral sway during ambulation and often reaches out to touch handrails. No loss of balance noted throughout gait training. Required one standing rest break during gait trial   Stairs             Wheelchair Mobility    Modified Rankin (Stroke Patients Only)       Balance Overall balance assessment: Independent Sitting-balance support: Feet supported;No upper extremity supported Sitting balance-Leahy Scale: Good     Standing balance support: During functional activity Standing balance-Leahy Scale: Good Standing balance comment: CGA during dynamic balance during ambulation. Able to maintain static standing to urinate with SPV                            Cognition Arousal/Alertness: Awake/alert Behavior During Therapy: WFL for tasks assessed/performed Overall Cognitive Status: Within Functional Limits for tasks assessed                                 General Comments: Pt is A and O x 4. agrees to session and was cooperative throughout. requested to ambulate without AD this session.      Exercises Other Exercises Other Exercises: OT engages pt in standing grooming and bathing tasks sink side with SETUP to CGA with RW or counter top use for UE support. Pt able to alternate hands from UE support to participate in ADLs. Other Exercises: Educated on performing  bed level exercises such as hip ABD, LAQ, ankle pumps, and heel raises. Pt verbalized understanding    General Comments        Pertinent Vitals/Pain Pain Assessment: 0-10 Pain Score: 7  Pain Location: hip Pain Descriptors / Indicators: Discomfort;Dull;Grimacing Pain Intervention(s): Limited activity within patient's tolerance;Monitored during  session    Home Living Family/patient expects to be discharged to:: Private residence Living Arrangements: Alone Available Help at Discharge: Friend(s);Available PRN/intermittently;Neighbor Type of Home: Apartment Home Access: Stairs to enter Entrance Stairs-Rails: Can reach both Home Layout: One level Home Equipment: Environmental consultant - 2 wheels;Cane - single point;Shower seat - built in Additional Comments: Some home set-up information obtained from previous addition d/t pt unable to provide all home set-up information    Prior Function Level of Independence: Needs assistance  Gait / Transfers Assistance Needed: Pt reports using a RW for functional mobility. 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 Comments: Pt is modified independent and uses RW for mobility. He is independent with most I/ADLs (bathing, dressing, cooking, cleaning). Pt reports he doesn't drive or work anymore. Friends assist with grocery shopping.   PT Goals (current goals can now be found in the care plan section) Acute Rehab PT Goals Patient Stated Goal: to get stronger PT Goal Formulation: With patient Time For Goal Achievement: 01/07/21 Potential to Achieve Goals: Fair Progress towards PT goals: Progressing toward goals    Frequency    Min 2X/week      PT Plan Current plan remains appropriate    Co-evaluation              AM-PAC PT "6 Clicks" Mobility   Outcome Measure  Help needed turning from your back to your side while in a flat bed without using bedrails?: None Help needed moving from lying on your back to sitting on the side of a flat bed without using bedrails?: None Help needed moving to and from a bed to a chair (including a wheelchair)?: A Little Help needed standing up from a chair using your arms (e.g., wheelchair or bedside chair)?: A  Little Help needed to walk in hospital room?: A Little Help needed climbing 3-5 steps with a railing? : A Lot 6 Click Score: 19    End of Session Equipment Utilized During Treatment: Gait belt Activity Tolerance: Patient tolerated treatment well;Patient limited by pain Patient left: in bed;with call bell/phone within reach;Other (comment) (In chair position) Nurse Communication: Mobility status PT Visit Diagnosis: Difficulty in walking, not elsewhere classified (R26.2);Other abnormalities of gait and mobility (R26.89);Repeated falls (R29.6);Muscle weakness (generalized) (M62.81);Other symptoms and signs involving the nervous system (R29.898);Dizziness and giddiness (R42)     Time: 8101-7510 PT Time Calculation (min) (ACUTE ONLY): 12 min  Charges:  $Gait Training: 8-22 mins                     Verl Blalock, SPT    Verl Blalock 01/04/2021, 12:55 PM

## 2021-01-04 NOTE — TOC Progression Note (Signed)
Transition of Care [facility]) - Progression Note    Patient Details  Name: Chad [name] MRN: [id number] Date of Birth: 13-Jul-1950  [facility] CM/SW Contact  Allayne Butcher, RN Phone Number: 01/04/2021, 1:58 PM  Clinical Narrative:    Bedside RN will call for [facility] when patient is ready to be discharged.    Expected Discharge Plan: Skilled Nursing Facility Barriers to Discharge: Barriers Resolved  Expected Discharge Plan and Services Expected Discharge Plan: Skilled Nursing Facility     Post Acute Care Choice: Skilled Nursing Facility Living arrangements for the past 2 months: Single Family Home Expected Discharge Date: 01/04/21               DME Arranged: N/A DME Agency: NA       HH Arranged: NA HH Agency: NA         Social Determinants of Health (SDOH) Interventions    Readmission Risk Interventions No flowsheet data found.

## 2021-01-04 NOTE — Evaluation (Signed)
Occupational Therapy Re-Evaluation Patient Details Name: Chad [name] MRN: [id number] DOB: 19-Mar-1951 Today's Date: 01/04/2021   History of Present Illness Osamu Olguin is a 74yoM who comes to [facility] 12/21/20 via [employer]. Police contacted after neighbors found patient to not be managing his excrement in an appropriate manner. Pt found to have (+) COVID test. Psych is following. PMH: ETOH abuse, MDD, BPH, HTN, DVT, acute encephalopathy.   Clinical Impression   Pt seen for OT re-evaluation this date in setting of prolonged hospital stay including addressing psych needs. Pt presents this date pleasant and willing to participate. Pt agreeable to fxl mobility and is able to complete fxl mobility around 1/2 nursing unit (~80') with RW with CGA with 2 rest breaks. Pt returns to room and sits with CGA for seated rest break. Requires cues to reach back to control descent. Pt tolerates ~6 mins standing for grooming tasks including oral care with counter top used for UE support and CGA for balance. Pt requires 2-3 min seated rest break and then tolerates ~8 min stand while completing UB/LB bathing and dressing tasks with CGA to MIN A. Pt left in bed in chair position with all needs met and in reach. Will continue to follow.      Recommendations for follow up therapy are one component of a multi-disciplinary discharge planning process, led by the attending physician.  Recommendations may be updated based on patient status, additional functional criteria and insurance authorization.   Follow Up Recommendations  SNF    Equipment Recommendations  Other (comment) (defer to next level of care, pt needs replacement glasses for safety with fxl mobility)    Recommendations for Other Services       Precautions / Restrictions Precautions Precautions: Fall Restrictions Weight Bearing Restrictions: No      Mobility Bed Mobility Overal bed mobility: Needs Assistance Bed Mobility: Supine to Sit      Supine to sit: Modified independent (Device/Increase time) Sit to supine: Modified independent (Device/Increase time)        Transfers Overall transfer level: Needs assistance Equipment used: Rolling walker (2 wheeled) Transfers: Sit to/from Stand Sit to Stand: Min guard         General transfer comment: some assist for steadying, but no physical assistance.    Balance Overall balance assessment: Needs assistance Sitting-balance support: Feet supported;No upper extremity supported Sitting balance-Leahy Scale: Good     Standing balance support: Bilateral upper extremity supported;During functional activity Standing balance-Leahy Scale: Good Standing balance comment: SUPV/CGA for static standing, CGA for fxl mobility around unit. B UE support for fxl mobility, able to alternate UE support during static standing tasks.                           ADL either performed or assessed with clinical judgement   ADL Overall ADL's : Needs assistance/impaired     Grooming: Wash/dry face;Oral care;Set up;Min guard;Standing Grooming Details (indicate cue type and reason): sink-side for ~5-6 mins to complete grooming tasks. Upper Body Bathing: Set up;Min guard;Standing Upper Body Bathing Details (indicate cue type and reason): takes seated rest break after grooming and then stands again ~8 mins for UB/LB bathing tasks. Lower Body Bathing: Minimal assistance;Min guard;Sit to/from stand Lower Body Bathing Details (indicate cue type and reason): sink-side using counter for balance/UE support, alternating hands to complete ADL tasks. Upper Body Dressing : Modified independent;Sitting   Lower Body Dressing: Min guard;Sitting/lateral leans  Functional mobility during ADLs: Min guard;Rolling walker (for fxl mobility around nursing unit and to sink in room for bathing/grooming tasks.)       Vision Baseline Vision/History: 1 Wears glasses Patient Visual Report:  No change from baseline Additional Comments: chronicly missing L lens of glasses, noted on at leat 3 admissions now     Perception     Praxis      Pertinent Vitals/Pain Pain Assessment: 0-10 Pain Score: 3  Pain Location: hip Pain Descriptors / Indicators: Discomfort;Dull;Grimacing Pain Intervention(s): Limited activity within patient's tolerance;Monitored during session;Repositioned     Hand Dominance Right   Extremity/Trunk Assessment Upper Extremity Assessment Upper Extremity Assessment: Overall WFL for tasks assessed;Generalized weakness (ROM WFL, MMT Grossly 4-/5)   Lower Extremity Assessment Lower Extremity Assessment: Overall WFL for tasks assessed;Generalized weakness   Cervical / Trunk Assessment Cervical / Trunk Assessment: Kyphotic   Communication Communication Communication: No difficulties   Cognition Arousal/Alertness: Awake/alert Behavior During Therapy: WFL for tasks assessed/performed Overall Cognitive Status: Within Functional Limits for tasks assessed                                 General Comments: Pt is A and O x 4. agrees to session and was cooperative throughout. requested to ambulate without AD this session.   General Comments       Exercises Other Exercises Other Exercises: OT engages pt in standing grooming and bathing tasks sink side with SETUP to CGA with RW or counter top use for UE support. Pt able to alternate hands from UE support to participate in ADLs.   Shoulder Instructions      Home Living Family/patient expects to be discharged to:: Private residence Living Arrangements: Alone Available Help at Discharge: Friend(s);Available PRN/intermittently;Neighbor Type of Home: Apartment Home Access: Stairs to enter [employer] of Steps: Full flight, 13 stairs Entrance Stairs-Rails: Can reach both Home Layout: One level     Bathroom Shower/Tub: Teacher, early years/pre: Standard Bathroom  Accessibility: No   Home Equipment: Walker - 2 wheels;Cane - single point;Shower seat - built in   Additional Comments: Some home set-up information obtained from previous addition d/t pt unable to provide all home set-up information      Prior Functioning/Environment Level of Independence: Needs assistance  Gait / Transfers Assistance Needed: Pt reports using a RW for functional mobility. 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   Comments: Pt is modified independent and uses RW for mobility. He is independent with most I/ADLs (bathing, dressing, cooking, cleaning). Pt reports he doesn't drive or work anymore. Friends assist with grocery shopping.        OT Problem List: Decreased strength;Decreased activity tolerance;Impaired balance (sitting and/or standing);Decreased cognition      OT Treatment/Interventions: Self-care/ADL training;Therapeutic exercise;Energy conservation;DME and/or AE instruction;Therapeutic activities;Patient/family education    OT Goals(Current goals can be found in the care plan section) Acute Rehab OT Goals Patient Stated Goal: to get stronger OT Goal Formulation: With patient Time For Goal Achievement: 01/18/21 Potential to Achieve Goals: Good ADL Goals Pt Will Perform Grooming: with supervision;standing Pt Will Perform Lower Body Dressing: with supervision;sit to/from stand Pt Will Transfer to Toilet: with supervision;ambulating (with LRAD to/from restroom with good safety awareness, no cues, no gross LOB.)  OT Frequency: Min 2X/week  Barriers to D/C:            Co-evaluation              AM-PAC OT "6 Clicks" Daily Activity     Outcome Measure Help from another person eating meals?: None Help from another person taking care of personal grooming?: A Little Help from another person  toileting, which includes using toliet, bedpan, or urinal?: A Little Help from another person bathing (including washing, rinsing, drying)?: A Little Help from another person to put on and taking off regular upper body clothing?: None Help from another person to put on and taking off regular lower body clothing?: A Little 6 Click Score: 20   End of Session Equipment Utilized During Treatment: Rolling walker  Activity Tolerance: Patient tolerated treatment well Patient left: with call bell/phone within reach;in bed (chair position)  OT Visit Diagnosis: Unsteadiness on feet (R26.81);History of falling (Z91.81);Muscle weakness (generalized) (M62.81)                Time: 3672-5500 OT Time Calculation (min): 27 min Charges:  OT General Charges $OT Visit: 1 Visit OT Treatments $Self Care/Home Management : 8-22 mins $Therapeutic Activity: 8-22 mins  Gerrianne Scale, MS, OTR/L ascom [phone] 01/04/21, 9:44 AM

## 2021-01-04 NOTE — Discharge Summary (Signed)
[facility] [place] at [facility]   PATIENT NAME: Chad [name]    MR#:  [id number]  DATE OF BIRTH:  Feb 07, 1951  DATE OF ADMISSION:  12/21/2020 ADMITTING PHYSICIAN: No admitting provider for patient encounter.  DATE OF DISCHARGE: 01/04/2021  PRIMARY CARE PHYSICIAN: Rayetta Humphrey, MD    ADMISSION DIAGNOSIS:  Weakness [R53.1] Physical deconditioning [R53.81] Alcohol dependence with uncomplicated withdrawal (HCC) [F10.230] Chest pain [R07.9] COVID-19 [U07.1]  DISCHARGE DIAGNOSIS:  Principal Problem:   Moderate recurrent major depression (HCC) Active Problems:   HTN (hypertension)   Alcohol withdrawal (HCC)   DVT (deep venous thrombosis) (HCC)   Alcohol dependence with uncomplicated withdrawal (HCC)   Alcohol-induced mood disorder (HCC)   Depression   Weakness   BPH (benign prostatic hyperplasia)   Wernicke's encephalopathy   Physical deconditioning   COVID-19   Malnutrition of moderate degree   Secondary adrenal insufficiency (HCC)   SECONDARY DIAGNOSIS:   Past Medical History:  Diagnosis Date  . Anxiety    takes lexapro  . Chronic kidney disease    patient states he was told he has 'moderate kidney disease'  . Degenerative disc disease, lumbar   . Exposure to hepatitis C   . GERD (gastroesophageal reflux disease)    takes OTC as needed - omeprazole  . Headache    on occasion  . History of gallstones   . Hypertension    diet controlled  . Pneumonia approx 15 years ago    HOSPITAL COURSE:   COVID-19 infection.  Patient completed isolation protocol here in the hospital and also completed Paxlovid treatment. Weakness.  Physical therapy has been evaluating and recommends rehab.  Received insurance authorization today and will be going out to rehab today.  Patient was found in unkempt living conditions at home. Alcohol abuse with alcohol withdrawal.  Completed alcohol withdrawal protocol.  Continue thiamine, folic acid and multivitamin.   Consider disulfiram or naltrexone for alcohol prevention upon discharge from rehab. Depression on Lexapro and trazodone Secondary adrenal insufficiency with empty sella syndrome on hydrocortisone 20 mg in the morning and 10 mg in the p.m. History of peptic ulcer disease.  Continue PPI.  History of bleeding ulcers. Transaminitis.  Liver function normalized Thrombocytopenia improved with no alcohol and folic acid treatment. Moderate malnutrition Chronic pain on gabapentin.  Try to taper off pain medications as quickly as possible and over to Tylenol.  DISCHARGE CONDITIONS:   Fair  CONSULTS OBTAINED:    Psychiatry  DRUG ALLERGIES:   Allergies  Allergen Reactions  . Morphine And Related Other (See Comments)    Causes "bad disposition"  . Ibuprofen Other (See Comments)    Gi upset    DISCHARGE MEDICATIONS:   Allergies as of 01/04/2021       Reactions   Morphine And Related Other (See Comments)   Causes "bad disposition"   Ibuprofen Other (See Comments)   Gi upset        Medication List     STOP taking these medications    polyethylene glycol 17 g packet Commonly known as: MIRALAX / GLYCOLAX       TAKE these medications    acetaminophen 325 MG tablet Commonly known as: TYLENOL Take 2 tablets (650 mg total) by mouth every 6 (six) hours as needed for mild pain (pain score 1-3 or temp > 100.5).   escitalopram 10 MG tablet Commonly known as: LEXAPRO Take 1 tablet (10 mg total) by mouth daily.   feeding supplement Liqd Take 237  mLs by mouth 3 (three) times daily between meals.   folic acid 1 MG tablet Commonly known as: FOLVITE Take 1 tablet (1 mg total) by mouth daily.   gabapentin 300 MG capsule Commonly known as: NEURONTIN Take 1 capsule (300 mg total) by mouth 3 (three) times daily.   HYDROcodone-acetaminophen 5-325 MG tablet Commonly known as: NORCO/VICODIN Take 1-2 tablets by mouth every 6 (six) hours as needed for moderate pain or severe pain.    hydrocortisone 10 MG tablet Commonly known as: CORTEF Two tabs po daily at 7am and one tablet po at 3pm   hydrOXYzine 25 MG tablet Commonly known as: ATARAX/VISTARIL Take 1 tablet (25 mg total) by mouth 3 (three) times daily as needed for anxiety.   multivitamin with minerals Tabs tablet Take 1 tablet by mouth daily.   nicotine 21 mg/24hr patch Commonly known as: NICODERM CQ - dosed in mg/24 hours One 21mg  patch chest wall daily (okay to substitute generic)   pantoprazole 40 MG tablet Commonly known as: Protonix Take 1 tablet (40 mg total) by mouth daily. What changed: when to take this   thiamine 100 MG tablet Take 1 tablet (100 mg total) by mouth daily.   traZODone 150 MG tablet Commonly known as: DESYREL Take 1 tablet (150 mg total) by mouth at bedtime. What changed:  medication strength how much to take         DISCHARGE INSTRUCTIONS:   Follow-up Dr. 1 day Follow-up Dr. Fuller Mandril endocrinology once out from rehab  If you experience worsening of your admission symptoms, develop shortness of breath, life threatening emergency, suicidal or homicidal thoughts you must seek medical attention immediately by calling 911 or calling your MD immediately  if symptoms less severe.  You Must read complete instructions/literature along with all the possible adverse reactions/side effects for all the Medicines you take and that have been prescribed to you. Take any new Medicines after you have completely understood and accept all the possible adverse reactions/side effects.   Please note  You were cared for by a hospitalist during your hospital stay. If you have any questions about your discharge medications or the care you received while you were in the hospital after you are discharged, you can call the unit and asked to speak with the hospitalist on call if the hospitalist that took care of you is not available. Once you are discharged, your primary care physician will  handle any further medical issues. Please note that NO REFILLS for any discharge medications will be authorized once you are discharged, as it is imperative that you return to your primary care physician (or establish a relationship with a primary care physician if you do not have one) for your aftercare needs so that they can reassess your need for medications and monitor your lab values.    Today   CHIEF COMPLAINT:   Chief Complaint  Patient presents with  . Suicidal    Pt. To ED via PD, states his neighbor called PD because they were concerned about him. Pt. Verbalizes SI x "several days". Pt. States he has had CP today with nausea.    HISTORY OF PRESENT ILLNESS:  Chad [name]  is a 70 y.o. male brought in because they were concerned about unkempt living conditions.   VITAL SIGNS:  Blood pressure 117/75, pulse 62, temperature 97.6 F (36.4 C), temperature source Oral, resp. rate 16, height 5\' 8"  (1.727 m), weight 75.3 kg, SpO2 96 %.    PHYSICAL EXAMINATION:  GENERAL:  70 y.o.-year-old patient lying in the bed with no acute distress.  EYES: Pupils equal, round, reactive to light and accommodation. No scleral icterus.   HEENT: Head atraumatic, normocephalic. Oropharynx and nasopharynx clear.  LUNGS: Normal breath sounds bilaterally, no wheezing, rales,rhonchi or crepitation. No use of accessory muscles of respiration.  CARDIOVASCULAR: S1, S2 normal. No murmurs, rubs, or gallops.  ABDOMEN: Soft, non-tender, non-distended. Bowel sounds present. No organomegaly or mass.  EXTREMITIES: No pedal edema, cyanosis, or clubbing.  NEUROLOGIC: Cranial nerves II through XII are intact. Muscle strength 5/5 in all extremities. Sensation intact. Gait not checked.  PSYCHIATRIC: The patient is alert and oriented x 3.  SKIN: No obvious rash, lesion, or ulcer.   DATA REVIEW:   CBC Recent Labs  Lab 01/02/21 0358  WBC 7.3  HGB 11.2*  HCT 34.4*  PLT 264    Chemistries  Recent Labs  Lab  01/01/21 0438  NA 138  K 4.0  CL 103  CO2 26  GLUCOSE 104*  BUN 20  CREATININE 0.66  CALCIUM 9.3     Microbiology Results  Results for orders placed or performed during the hospital encounter of 12/21/20  Resp Panel by RT-PCR (Flu A&B, Covid) Nasopharyngeal Swab     Status: Abnormal   Collection Time: 12/21/20  9:42 PM   Specimen: Nasopharyngeal Swab; Nasopharyngeal(NP) swabs in vial transport medium  Result Value Ref Range Status   SARS Coronavirus 2 by RT PCR POSITIVE (A) NEGATIVE Final    Comment: RESULT CALLED TO, READ BACK BY AND VERIFIED WITH: BRANDON NORRIS AT 2252 ON 12/21/20 BY SS (NOTE) SARS-CoV-2 target nucleic acids are DETECTED.  The SARS-CoV-2 RNA is generally detectable in upper respiratory specimens during the acute phase of infection. Positive results are indicative of the presence of the identified virus, but do not rule out bacterial infection or co-infection with other pathogens not detected by the test. Clinical correlation with patient history and other diagnostic information is necessary to determine patient infection status. The expected result is Negative.  Fact Sheet for Patients: BloggerCourse.com  Fact Sheet for Healthcare Providers: SeriousBroker.it  This test is not yet approved or cleared by the [place] FDA and  has been authorized for detection and/or diagnosis of SARS-CoV-2 by FDA under an Emergency Use Authorization (EUA).  This EUA will remain in effect (meaning this test c an be used) for the duration of  the COVID-19 declaration under Section 564(b)(1) of the Act, 21 U.S.C. section 360bbb-3(b)(1), unless the authorization is terminated or revoked sooner.     Influenza A by PCR NEGATIVE NEGATIVE Final   Influenza B by PCR NEGATIVE NEGATIVE Final    Comment: (NOTE) The Xpert Xpress SARS-CoV-2/FLU/RSV plus assay is intended as an aid in the diagnosis of influenza from  Nasopharyngeal swab specimens and should not be used as a sole basis for treatment. Nasal washings and aspirates are unacceptable for Xpert Xpress SARS-CoV-2/FLU/RSV testing.  Fact Sheet for Patients: BloggerCourse.com  Fact Sheet for Healthcare Providers: SeriousBroker.it  This test is not yet approved or cleared by the [place] FDA and has been authorized for detection and/or diagnosis of SARS-CoV-2 by FDA under an Emergency Use Authorization (EUA). This EUA will remain in effect (meaning this test can be used) for the duration of the COVID-19 declaration under Section 564(b)(1) of the Act, 21 U.S.C. section 360bbb-3(b)(1), unless the authorization is terminated or revoked.  Performed at [facility], [address] [place], [place] [address]  Management plans discussed with the patient, and he is in agreement. Left message for Niece.  CODE STATUS:     Code Status Orders  (From admission, onward)           Start     Ordered   12/22/20 1603  Full code  Continuous        12/22/20 1602           Code Status History     Date Active Date Inactive Code Status Order ID Comments User Context   12/21/2020 2139 12/22/2020 1602 Full Code [id number]  Minna Antis, MD ED   09/27/2020 1026 10/02/2020 2148 Full Code [id number]  Lucile Shutters, MD ED   08/01/2020 1655 08/14/2020 1847 Full Code [id number]  Eugenie Norrie, NP ED   05/02/2020 0058 05/03/2020 2039 Full Code [id number]  Briscoe Deutscher, MD ED   02/04/2020 1814 02/10/2020 2052 Full Code [id number]  Lurene Shadow, MD ED   01/14/2020 0247 01/19/2020 2357 Full Code [id number]  Mansy, Vernetta Honey, MD ED   01/14/2020 0144 01/14/2020 0247 Full Code [id number]  Nita Sickle, MD ED   12/31/2019 1631 01/06/2020 2346 Full Code [id number]  Gertha Calkin, MD ED   12/09/2019 1729 12/12/2019 1712 Full Code [id number]  Alford Highland, MD ED   02/20/2019 0944 02/20/2019 1734  Full Code [id number]  Chesley Noon, MD ED   01/23/2019 1648 01/25/2019 1957 Full Code [id number]  Mayo, Allyn Kenner, MD Inpatient   05/17/2018 2327 05/18/2018 1902 Full Code [id number]  Oralia Manis, MD Inpatient   05/17/2018 1950 05/17/2018 2327 Full Code [id number]  Nita Sickle, MD ED   12/24/2017 0803 12/25/2017 1645 Full Code [id number]  Arnaldo Natal, MD ED   06/07/2015 1619 06/08/2015 1754 Full Code [id number]  Shirlean Kelly, MD Inpatient       TOTAL TIME TAKING CARE OF THIS PATIENT: 31 minutes.    Alford Highland M.D on 01/04/2021 at 11:43 AM   [facility]  CC: Primary care physician; Rayetta Humphrey, MD

## 2021-01-04 NOTE — Plan of Care (Signed)
Report given to oncoming nurse at [facility], patient is in stable condition, calling for [facility] to take patient to the facility.

## 2021-01-04 NOTE — TOC Transition Note (Addendum)
Transition of Care [facility]) - CM/SW Discharge Note   Patient Details  Name: Chad [name] MRN: [id number] Date of Birth: 08-Oct-1950  Transition of Care [facility] CM/SW Contact:  Allayne Butcher, RN Phone Number: 01/04/2021, 12:06 PM   Clinical Narrative:    Patient medically cleared for discharge to [facility] today and [employer] authorization approved.  Patient will go to room 809 at [facility].  Bedside RN will call report to [phone].  Patient will transport via [employer].  RNCM will arrange transport once patient is completely ready to go.   RNCM did attempt to contact patient's niece, Consuella Lose to notify of discharge, message left on VM.   Final next level of care: Skilled Nursing Facility Barriers to Discharge: Barriers Resolved   Patient Goals and CMS Choice Patient states their goals for this hospitalization and ongoing recovery are:: Patient glad to be going to [facility] for rehab CMS Medicare.gov Compare Post Acute Care list provided to:: Patient Choice offered to / list presented to : Patient  Discharge Placement              Patient chooses bed at: [facility] [employer] Patient to be transferred to facility by: [facility] Name of family member notified: Larena Glassman Patient and family notified of of transfer: 01/04/21  Discharge Plan and Services     Post Acute Care Choice: Skilled Nursing Facility          DME Arranged: N/A DME Agency: NA       HH Arranged: NA HH Agency: NA        Social Determinants of Health (SDOH) Interventions     Readmission Risk Interventions No flowsheet data found.

## 2021-01-04 NOTE — Plan of Care (Signed)
Continuing with plan of care. 

## 2021-01-04 NOTE — Care Management Important Message (Signed)
Important Message  Patient Details  Name: Chad [name] MRN: [id number] Date of Birth: 1951/03/21   Medicare Important Message Given:  Yes     [name] 01/04/2021, 1:29 PM

## 2021-01-25 ENCOUNTER — Other Ambulatory Visit: Payer: Self-pay

## 2021-01-25 ENCOUNTER — Inpatient Hospital Stay
Admission: EM | Admit: 2021-01-25 | Discharge: 2021-01-30 | DRG: 641 | Disposition: A | Discharge status: Other Acute Inpatient Hospital | Payer: Medicare HMO | Attending: Hospitalist | Admitting: Hospitalist

## 2021-01-25 DIAGNOSIS — E871 Hypo-osmolality and hyponatremia: Principal | ICD-10-CM | POA: Diagnosis present

## 2021-01-25 DIAGNOSIS — F10229 Alcohol dependence with intoxication, unspecified: Secondary | ICD-10-CM | POA: Diagnosis present

## 2021-01-25 DIAGNOSIS — R45851 Suicidal ideations: Secondary | ICD-10-CM | POA: Diagnosis present

## 2021-01-25 DIAGNOSIS — Z6825 Body mass index (BMI) 25.0-25.9, adult: Secondary | ICD-10-CM

## 2021-01-25 DIAGNOSIS — F1010 Alcohol abuse, uncomplicated: Secondary | ICD-10-CM | POA: Diagnosis not present

## 2021-01-25 DIAGNOSIS — M19041 Primary osteoarthritis, right hand: Secondary | ICD-10-CM | POA: Diagnosis present

## 2021-01-25 DIAGNOSIS — M25552 Pain in left hip: Secondary | ICD-10-CM | POA: Diagnosis present

## 2021-01-25 DIAGNOSIS — Y902 Blood alcohol level of 40-59 mg/100 ml: Secondary | ICD-10-CM | POA: Diagnosis present

## 2021-01-25 DIAGNOSIS — Z23 Encounter for immunization: Secondary | ICD-10-CM

## 2021-01-25 DIAGNOSIS — Z886 Allergy status to analgesic agent status: Secondary | ICD-10-CM

## 2021-01-25 DIAGNOSIS — K219 Gastro-esophageal reflux disease without esophagitis: Secondary | ICD-10-CM | POA: Diagnosis present

## 2021-01-25 DIAGNOSIS — Z20822 Contact with and (suspected) exposure to covid-19: Secondary | ICD-10-CM | POA: Diagnosis present

## 2021-01-25 DIAGNOSIS — R627 Adult failure to thrive: Secondary | ICD-10-CM | POA: Diagnosis present

## 2021-01-25 DIAGNOSIS — F332 Major depressive disorder, recurrent severe without psychotic features: Secondary | ICD-10-CM

## 2021-01-25 DIAGNOSIS — E878 Other disorders of electrolyte and fluid balance, not elsewhere classified: Secondary | ICD-10-CM | POA: Diagnosis present

## 2021-01-25 DIAGNOSIS — M19042 Primary osteoarthritis, left hand: Secondary | ICD-10-CM | POA: Diagnosis present

## 2021-01-25 DIAGNOSIS — F411 Generalized anxiety disorder: Secondary | ICD-10-CM | POA: Diagnosis present

## 2021-01-25 DIAGNOSIS — G8929 Other chronic pain: Secondary | ICD-10-CM | POA: Diagnosis present

## 2021-01-25 DIAGNOSIS — G4700 Insomnia, unspecified: Secondary | ICD-10-CM | POA: Diagnosis present

## 2021-01-25 DIAGNOSIS — K709 Alcoholic liver disease, unspecified: Secondary | ICD-10-CM | POA: Diagnosis present

## 2021-01-25 DIAGNOSIS — Z91199 Patient's noncompliance with other medical treatment and regimen due to unspecified reason: Secondary | ICD-10-CM

## 2021-01-25 DIAGNOSIS — E440 Moderate protein-calorie malnutrition: Secondary | ICD-10-CM | POA: Diagnosis present

## 2021-01-25 DIAGNOSIS — M5136 Other intervertebral disc degeneration, lumbar region: Secondary | ICD-10-CM | POA: Diagnosis present

## 2021-01-25 DIAGNOSIS — N400 Enlarged prostate without lower urinary tract symptoms: Secondary | ICD-10-CM | POA: Diagnosis present

## 2021-01-25 DIAGNOSIS — Z8616 Personal history of COVID-19: Secondary | ICD-10-CM

## 2021-01-25 DIAGNOSIS — F10239 Alcohol dependence with withdrawal, unspecified: Secondary | ICD-10-CM | POA: Diagnosis present

## 2021-01-25 DIAGNOSIS — F17210 Nicotine dependence, cigarettes, uncomplicated: Secondary | ICD-10-CM | POA: Diagnosis present

## 2021-01-25 DIAGNOSIS — Z885 Allergy status to narcotic agent status: Secondary | ICD-10-CM

## 2021-01-25 DIAGNOSIS — Z79899 Other long term (current) drug therapy: Secondary | ICD-10-CM

## 2021-01-25 DIAGNOSIS — Z86718 Personal history of other venous thrombosis and embolism: Secondary | ICD-10-CM

## 2021-01-25 DIAGNOSIS — I10 Essential (primary) hypertension: Secondary | ICD-10-CM | POA: Diagnosis present

## 2021-01-25 DIAGNOSIS — Z811 Family history of alcohol abuse and dependence: Secondary | ICD-10-CM

## 2021-01-25 DIAGNOSIS — F10129 Alcohol abuse with intoxication, unspecified: Secondary | ICD-10-CM | POA: Diagnosis present

## 2021-01-25 DIAGNOSIS — R4182 Altered mental status, unspecified: Secondary | ICD-10-CM | POA: Diagnosis not present

## 2021-01-25 LAB — COMPREHENSIVE METABOLIC PANEL
ALT: 30 U/L (ref 0–44)
AST: 39 U/L (ref 15–41)
Albumin: 3.9 g/dL (ref 3.5–5.0)
Alkaline Phosphatase: 115 U/L (ref 38–126)
Anion gap: 20 — ABNORMAL HIGH (ref 5–15)
BUN: 14 mg/dL (ref 8–23)
CO2: 13 mmol/L — ABNORMAL LOW (ref 22–32)
Calcium: 8.7 mg/dL — ABNORMAL LOW (ref 8.9–10.3)
Chloride: 91 mmol/L — ABNORMAL LOW (ref 98–111)
Creatinine, Ser: 0.65 mg/dL (ref 0.61–1.24)
GFR, Estimated: >60 mL/min (ref >60)
Glucose, Bld: 111 mg/dL — ABNORMAL HIGH (ref 70–99)
Potassium: 4.8 mmol/L (ref 3.5–5.1)
Sodium: 124 mmol/L — ABNORMAL LOW (ref 135–145)
Total Bilirubin: 1.6 mg/dL — ABNORMAL HIGH (ref 0.3–1.2)
Total Protein: 6.9 g/dL (ref 6.5–8.1)

## 2021-01-25 LAB — CBC
HCT: 46.8 % (ref 39.0–52.0)
Hemoglobin: 14.5 g/dL (ref 13.0–17.0)
MCH: 30.3 pg (ref 26.0–34.0)
MCHC: 31 g/dL (ref 30.0–36.0)
MCV: 97.7 fL (ref 80.0–100.0)
Platelets: 192 10*3/uL (ref 150–400)
RBC: 4.79 MIL/uL (ref 4.22–5.81)
RDW: 14.1 % (ref 11.5–15.5)
WBC: 10 10*3/uL (ref 4.0–10.5)
nRBC: 0 % (ref 0.0–0.2)

## 2021-01-25 LAB — URINE DRUG SCREEN, QUALITATIVE (ARMC ONLY)
Amphetamines, Ur Screen: NOT DETECTED
Barbiturates, Ur Screen: NOT DETECTED
Benzodiazepine, Ur Scrn: NOT DETECTED
Cannabinoid 50 Ng, Ur ~~LOC~~: NOT DETECTED
Cocaine Metabolite,Ur ~~LOC~~: NOT DETECTED
MDMA (Ecstasy)Ur Screen: NOT DETECTED
Methadone Scn, Ur: NOT DETECTED
Opiate, Ur Screen: NOT DETECTED
Phencyclidine (PCP) Ur S: NOT DETECTED
Tricyclic, Ur Screen: NOT DETECTED

## 2021-01-25 LAB — SALICYLATE LEVEL: Salicylate Lvl: <7 mg/dL — ABNORMAL LOW (ref 7.0–30.0)

## 2021-01-25 LAB — ACETAMINOPHEN LEVEL: Acetaminophen (Tylenol), Serum: <10 ug/mL — ABNORMAL LOW (ref 10–30)

## 2021-01-25 LAB — ETHANOL: Alcohol, Ethyl (B): 48 mg/dL — ABNORMAL HIGH (ref <10)

## 2021-01-25 MED ORDER — THIAMINE HCL 100 MG/ML IJ SOLN
100.0000 mg | Freq: Once | INTRAMUSCULAR | Status: AC
  Administered 2021-01-26: 100 mg via INTRAVENOUS
  Filled 2021-01-25: qty 2

## 2021-01-25 MED ORDER — HYDROXYZINE HCL 25 MG PO TABS
25.0000 mg | ORAL_TABLET | Freq: Once | ORAL | Status: AC
  Administered 2021-01-25: 25 mg via ORAL
  Filled 2021-01-25: qty 1

## 2021-01-25 MED ORDER — SODIUM CHLORIDE 0.9 % IV BOLUS
1000.0000 mL | Freq: Once | INTRAVENOUS | Status: AC
  Administered 2021-01-25: 1000 mL via INTRAVENOUS

## 2021-01-25 MED ORDER — SODIUM CHLORIDE 0.9 % IV BOLUS
1000.0000 mL | Freq: Once | INTRAVENOUS | Status: AC
Start: 2021-01-25 — End: 2021-01-26
  Administered 2021-01-25: 1000 mL via INTRAVENOUS

## 2021-01-25 NOTE — ED Notes (Signed)
VOL/  PENDING  CONSULT 

## 2021-01-25 NOTE — ED Triage Notes (Addendum)
See first nurse note- pt to ER from home via [facility]. Reports feeling suicidal x3 days. No plan. Pt reports living alone and having difficulty caring for himself, when asked about this he states "I need to be in a nursing home or something like that".   Reports nausea in triage. Reports drinking a pint of liquor every day. Last drink PTA.

## 2021-01-25 NOTE — ED Notes (Signed)
Pt dressed out at this time with this RN and Faith EDT. Pt belongings include- 2x pairs broken glasses Grey shorts White brief Hospital gown Cigarettes Phone Green lighter/ black wallet

## 2021-01-25 NOTE — ED Notes (Signed)
Pt arrived from triage via wheelchair, escorted by [facility] staff x2.  Pt acclimated  to environment, verbalized understanding and denies any questions.  Pt endorses SI, denies HI.  Confirmed that he is a daily drinker who reports hx of dt complications. Pt stated he has not eaten in >1week. Cont to monitor as ordered for any changes in behaviors and for continued safety.

## 2021-01-25 NOTE — ED Notes (Signed)
Pt given cup of sprite.  
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
## 2021-01-25 NOTE — ED Notes (Signed)
Pt ambulatory to bathroom. Bed linen changed d/t pt spilling sprite on the bed.

## 2021-01-25 NOTE — ED Provider Notes (Signed)
-----------------------------------------   11:50 PM on 01/25/2021 -----------------------------------------  Assuming care from Dr. Derrill Kay.  In short, Chad [name] is a 70 y.o. male with a chief complaint of substance abuse, psych issues, unable to care for self.  Refer to the original H&P for additional details.  ----------------------------------------- 1:57 AM on 01/26/2021 -----------------------------------------  The patient was evaluated by psychiatry and cleared from a psychiatric perspective.  However given his elevated anion gap and low sodium of 124, he had received 2 L of normal saline.  I rechecked a BMP and his anion gap closed which is reassuring and not suggestive of persistent alcoholic ketoacidosis, but his sodium is still only 125.  Given his chronic alcohol abuse, failure to thrive, and persistent hyponatremia, I think he would be safer in the hospital.  I ordered thiamine 100 mg IV and he is on CIWA protocol.  I discussed the case by phone with Dr. Arville Care with the hospitalist service and he will admit.  The patient will likely need TOC assistance while in the hospital for placement.   Loleta Rose, MD 01/26/21 [phone]

## 2021-01-25 NOTE — ED Notes (Signed)
Unable to successfully obtain lab work, patient reports not wanting to be "gauged" again.

## 2021-01-25 NOTE — ED Notes (Signed)
First Nurse Note:  Pt to ED via [facility] from home. Per EMS APS came out to visit pt. Pt told them that he has not eaten in about 1 week and that he is feeling weak and having SI. EMS reports that pt left from [facility] AMA about 1 week ago.

## 2021-01-26 ENCOUNTER — Encounter: Payer: Self-pay | Admitting: Family Medicine

## 2021-01-26 DIAGNOSIS — Y902 Blood alcohol level of 40-59 mg/100 ml: Secondary | ICD-10-CM | POA: Diagnosis present

## 2021-01-26 DIAGNOSIS — F10921 Alcohol use, unspecified with intoxication delirium: Secondary | ICD-10-CM | POA: Diagnosis not present

## 2021-01-26 DIAGNOSIS — K709 Alcoholic liver disease, unspecified: Secondary | ICD-10-CM | POA: Diagnosis present

## 2021-01-26 DIAGNOSIS — Z20822 Contact with and (suspected) exposure to covid-19: Secondary | ICD-10-CM | POA: Diagnosis present

## 2021-01-26 DIAGNOSIS — M19041 Primary osteoarthritis, right hand: Secondary | ICD-10-CM | POA: Diagnosis present

## 2021-01-26 DIAGNOSIS — F1010 Alcohol abuse, uncomplicated: Secondary | ICD-10-CM

## 2021-01-26 DIAGNOSIS — F17210 Nicotine dependence, cigarettes, uncomplicated: Secondary | ICD-10-CM | POA: Diagnosis present

## 2021-01-26 DIAGNOSIS — M19042 Primary osteoarthritis, left hand: Secondary | ICD-10-CM | POA: Diagnosis present

## 2021-01-26 DIAGNOSIS — G8929 Other chronic pain: Secondary | ICD-10-CM | POA: Diagnosis present

## 2021-01-26 DIAGNOSIS — E871 Hypo-osmolality and hyponatremia: Secondary | ICD-10-CM | POA: Diagnosis present

## 2021-01-26 DIAGNOSIS — G4700 Insomnia, unspecified: Secondary | ICD-10-CM | POA: Diagnosis present

## 2021-01-26 DIAGNOSIS — F332 Major depressive disorder, recurrent severe without psychotic features: Secondary | ICD-10-CM | POA: Diagnosis present

## 2021-01-26 DIAGNOSIS — F10229 Alcohol dependence with intoxication, unspecified: Secondary | ICD-10-CM | POA: Diagnosis present

## 2021-01-26 DIAGNOSIS — E440 Moderate protein-calorie malnutrition: Secondary | ICD-10-CM | POA: Diagnosis present

## 2021-01-26 DIAGNOSIS — Z8616 Personal history of COVID-19: Secondary | ICD-10-CM | POA: Diagnosis not present

## 2021-01-26 DIAGNOSIS — Z6825 Body mass index (BMI) 25.0-25.9, adult: Secondary | ICD-10-CM | POA: Diagnosis not present

## 2021-01-26 DIAGNOSIS — R531 Weakness: Secondary | ICD-10-CM | POA: Diagnosis not present

## 2021-01-26 DIAGNOSIS — F10239 Alcohol dependence with withdrawal, unspecified: Secondary | ICD-10-CM | POA: Diagnosis present

## 2021-01-26 DIAGNOSIS — Z811 Family history of alcohol abuse and dependence: Secondary | ICD-10-CM | POA: Diagnosis not present

## 2021-01-26 DIAGNOSIS — Z91199 Patient's noncompliance with other medical treatment and regimen due to unspecified reason: Secondary | ICD-10-CM | POA: Diagnosis not present

## 2021-01-26 DIAGNOSIS — K219 Gastro-esophageal reflux disease without esophagitis: Secondary | ICD-10-CM | POA: Diagnosis present

## 2021-01-26 DIAGNOSIS — M5136 Other intervertebral disc degeneration, lumbar region: Secondary | ICD-10-CM | POA: Diagnosis present

## 2021-01-26 DIAGNOSIS — R627 Adult failure to thrive: Secondary | ICD-10-CM | POA: Diagnosis present

## 2021-01-26 DIAGNOSIS — N400 Enlarged prostate without lower urinary tract symptoms: Secondary | ICD-10-CM | POA: Diagnosis present

## 2021-01-26 DIAGNOSIS — I10 Essential (primary) hypertension: Secondary | ICD-10-CM | POA: Diagnosis present

## 2021-01-26 DIAGNOSIS — E878 Other disorders of electrolyte and fluid balance, not elsewhere classified: Secondary | ICD-10-CM | POA: Diagnosis present

## 2021-01-26 DIAGNOSIS — R4182 Altered mental status, unspecified: Secondary | ICD-10-CM | POA: Diagnosis present

## 2021-01-26 DIAGNOSIS — Z86718 Personal history of other venous thrombosis and embolism: Secondary | ICD-10-CM | POA: Diagnosis not present

## 2021-01-26 DIAGNOSIS — R45851 Suicidal ideations: Secondary | ICD-10-CM

## 2021-01-26 DIAGNOSIS — Z23 Encounter for immunization: Secondary | ICD-10-CM | POA: Diagnosis present

## 2021-01-26 DIAGNOSIS — F10129 Alcohol abuse with intoxication, unspecified: Secondary | ICD-10-CM | POA: Diagnosis present

## 2021-01-26 DIAGNOSIS — Z79899 Other long term (current) drug therapy: Secondary | ICD-10-CM | POA: Diagnosis not present

## 2021-01-26 LAB — BASIC METABOLIC PANEL
Anion gap: 9 (ref 5–15)
BUN: 12 mg/dL (ref 8–23)
CO2: 21 mmol/L — ABNORMAL LOW (ref 22–32)
Calcium: 8 mg/dL — ABNORMAL LOW (ref 8.9–10.3)
Chloride: 95 mmol/L — ABNORMAL LOW (ref 98–111)
Creatinine, Ser: 0.71 mg/dL (ref 0.61–1.24)
GFR, Estimated: >60 mL/min (ref >60)
Glucose, Bld: 119 mg/dL — ABNORMAL HIGH (ref 70–99)
Potassium: 4.2 mmol/L (ref 3.5–5.1)
Sodium: 125 mmol/L — ABNORMAL LOW (ref 135–145)

## 2021-01-26 LAB — NA AND K (SODIUM & POTASSIUM), RAND UR
Potassium Urine: 44 mmol/L
Sodium, Ur: 21 mmol/L

## 2021-01-26 LAB — RESP PANEL BY RT-PCR (FLU A&B, COVID) ARPGX2
Influenza A by PCR: NEGATIVE
Influenza B by PCR: NEGATIVE
SARS Coronavirus 2 by RT PCR: NEGATIVE

## 2021-01-26 LAB — CBC
HCT: 35.1 % — ABNORMAL LOW (ref 39.0–52.0)
Hemoglobin: 12 g/dL — ABNORMAL LOW (ref 13.0–17.0)
MCH: 31.3 pg (ref 26.0–34.0)
MCHC: 34.2 g/dL (ref 30.0–36.0)
MCV: 91.4 fL (ref 80.0–100.0)
Platelets: 149 10*3/uL — ABNORMAL LOW (ref 150–400)
RBC: 3.84 MIL/uL — ABNORMAL LOW (ref 4.22–5.81)
RDW: 13.9 % (ref 11.5–15.5)
WBC: 8.3 10*3/uL (ref 4.0–10.5)
nRBC: 0 % (ref 0.0–0.2)

## 2021-01-26 LAB — COMPREHENSIVE METABOLIC PANEL
ALT: 27 U/L (ref 0–44)
AST: 35 U/L (ref 15–41)
Albumin: 3.4 g/dL — ABNORMAL LOW (ref 3.5–5.0)
Alkaline Phosphatase: 91 U/L (ref 38–126)
Anion gap: 9 (ref 5–15)
BUN: 11 mg/dL (ref 8–23)
CO2: 23 mmol/L (ref 22–32)
Calcium: 8.6 mg/dL — ABNORMAL LOW (ref 8.9–10.3)
Chloride: 96 mmol/L — ABNORMAL LOW (ref 98–111)
Creatinine, Ser: 0.59 mg/dL — ABNORMAL LOW (ref 0.61–1.24)
GFR, Estimated: >60 mL/min (ref >60)
Glucose, Bld: 124 mg/dL — ABNORMAL HIGH (ref 70–99)
Potassium: 4.1 mmol/L (ref 3.5–5.1)
Sodium: 128 mmol/L — ABNORMAL LOW (ref 135–145)
Total Bilirubin: 2 mg/dL — ABNORMAL HIGH (ref 0.3–1.2)
Total Protein: 5.7 g/dL — ABNORMAL LOW (ref 6.5–8.1)

## 2021-01-26 LAB — TSH: TSH: 0.614 u[IU]/mL (ref 0.350–4.500)

## 2021-01-26 LAB — HIV ANTIBODY (ROUTINE TESTING W REFLEX): HIV Screen 4th Generation wRfx: NONREACTIVE

## 2021-01-26 LAB — OSMOLALITY: Osmolality: 278 mOsm/kg (ref 275–295)

## 2021-01-26 LAB — OSMOLALITY, URINE: Osmolality, Ur: 736 mOsm/kg (ref 300–900)

## 2021-01-26 MED ORDER — FOLIC ACID 1 MG PO TABS
1.0000 mg | ORAL_TABLET | Freq: Every day | ORAL | Status: DC
  Administered 2021-01-26 – 2021-01-30 (×5): 1 mg via ORAL
  Filled 2021-01-26 (×5): qty 1

## 2021-01-26 MED ORDER — ADULT MULTIVITAMIN W/MINERALS CH
1.0000 | ORAL_TABLET | Freq: Every day | ORAL | Status: DC
  Administered 2021-01-26 – 2021-01-30 (×5): 1 via ORAL
  Filled 2021-01-26 (×5): qty 1

## 2021-01-26 MED ORDER — ONDANSETRON HCL 4 MG PO TABS: 4.0000 mg | ORAL_TABLET | Freq: Four times a day (QID) | ORAL | Status: DC | PRN

## 2021-01-26 MED ORDER — HYDROCODONE-ACETAMINOPHEN 5-325 MG PO TABS
1.0000 | ORAL_TABLET | Freq: Three times a day (TID) | ORAL | Status: DC | PRN
  Administered 2021-01-26 – 2021-01-29 (×7): 1 via ORAL
  Filled 2021-01-26 (×8): qty 1

## 2021-01-26 MED ORDER — LORAZEPAM 2 MG/ML IJ SOLN: 1.0000 mg | INTRAMUSCULAR | Status: DC | PRN

## 2021-01-26 MED ORDER — TRAZODONE HCL 50 MG PO TABS
50.0000 mg | ORAL_TABLET | Freq: Every evening | ORAL | Status: DC | PRN
  Administered 2021-01-26 – 2021-01-28 (×3): 50 mg via ORAL
  Filled 2021-01-26 (×3): qty 1

## 2021-01-26 MED ORDER — PANTOPRAZOLE SODIUM 40 MG PO TBEC
40.0000 mg | DELAYED_RELEASE_TABLET | Freq: Every day | ORAL | Status: DC
  Administered 2021-01-26 – 2021-01-30 (×5): 40 mg via ORAL
  Filled 2021-01-26 (×5): qty 1

## 2021-01-26 MED ORDER — ALUM & MAG HYDROXIDE-SIMETH 200-200-20 MG/5ML PO SUSP
30.0000 mL | Freq: Three times a day (TID) | ORAL | Status: DC | PRN
  Administered 2021-01-26: 30 mL via ORAL
  Filled 2021-01-26: qty 30

## 2021-01-26 MED ORDER — MAGNESIUM HYDROXIDE 400 MG/5ML PO SUSP: 30.0000 mL | Freq: Every day | ORAL | Status: DC | PRN

## 2021-01-26 MED ORDER — THIAMINE HCL 100 MG/ML IJ SOLN
Freq: Once | INTRAVENOUS | Status: AC
  Filled 2021-01-26: qty 1000

## 2021-01-26 MED ORDER — LORAZEPAM 2 MG/ML IJ SOLN
1.0000 mg | INTRAMUSCULAR | Status: DC | PRN
  Administered 2021-01-26: 1 mg via INTRAVENOUS
  Filled 2021-01-26: qty 1

## 2021-01-26 MED ORDER — INFLUENZA VAC A&B SA ADJ QUAD 0.5 ML IM PRSY
0.5000 mL | PREFILLED_SYRINGE | INTRAMUSCULAR | Status: AC
  Administered 2021-01-27: 0.5 mL via INTRAMUSCULAR
  Filled 2021-01-26: qty 0.5

## 2021-01-26 MED ORDER — ONDANSETRON HCL 4 MG/2ML IJ SOLN: 4.0000 mg | Freq: Four times a day (QID) | INTRAMUSCULAR | Status: DC | PRN

## 2021-01-26 MED ORDER — CITALOPRAM HYDROBROMIDE 20 MG PO TABS
10.0000 mg | ORAL_TABLET | Freq: Every day | ORAL | Status: DC
  Administered 2021-01-26 – 2021-01-30 (×5): 10 mg via ORAL
  Filled 2021-01-26 (×5): qty 1

## 2021-01-26 MED ORDER — HYDROCODONE-ACETAMINOPHEN 5-325 MG PO TABS
1.0000 | ORAL_TABLET | Freq: Four times a day (QID) | ORAL | Status: DC | PRN
  Administered 2021-01-26: 2 via ORAL
  Filled 2021-01-26: qty 2

## 2021-01-26 MED ORDER — ACETAMINOPHEN 325 MG RE SUPP
650.0000 mg | Freq: Four times a day (QID) | RECTAL | Status: DC | PRN
  Filled 2021-01-26: qty 2

## 2021-01-26 MED ORDER — SODIUM CHLORIDE 0.9 % IV SOLN: INTRAVENOUS | Status: DC

## 2021-01-26 MED ORDER — ENSURE ENLIVE PO LIQD
237.0000 mL | Freq: Three times a day (TID) | ORAL | Status: DC
  Administered 2021-01-26 – 2021-01-30 (×15): 237 mL via ORAL

## 2021-01-26 MED ORDER — HYDROXYZINE HCL 25 MG PO TABS
25.0000 mg | ORAL_TABLET | Freq: Three times a day (TID) | ORAL | Status: DC | PRN
  Administered 2021-01-26 – 2021-01-28 (×4): 25 mg via ORAL
  Filled 2021-01-26 (×4): qty 1

## 2021-01-26 MED ORDER — GABAPENTIN 300 MG PO CAPS
300.0000 mg | ORAL_CAPSULE | Freq: Three times a day (TID) | ORAL | Status: DC
  Administered 2021-01-26 – 2021-01-30 (×15): 300 mg via ORAL
  Filled 2021-01-26 (×15): qty 1

## 2021-01-26 MED ORDER — ACETAMINOPHEN 325 MG PO TABS
650.0000 mg | ORAL_TABLET | Freq: Four times a day (QID) | ORAL | Status: DC | PRN
  Filled 2021-01-26 (×2): qty 2

## 2021-01-26 MED ORDER — ENOXAPARIN SODIUM 40 MG/0.4ML IJ SOSY
40.0000 mg | PREFILLED_SYRINGE | INTRAMUSCULAR | Status: DC
  Administered 2021-01-26 – 2021-01-30 (×5): 40 mg via SUBCUTANEOUS
  Filled 2021-01-26 (×5): qty 0.4

## 2021-01-26 NOTE — Progress Notes (Addendum)
[facility]  -  at [facility]   PATIENT NAME: Alexsander Cavins    MR#:  [id number]  DATE OF BIRTH:  June 09, 1950  SUBJECTIVE:  patient came in with generalized weakness from home. He was recently admitted discharged to [facility] and left from there AMA. Has not been eating well found to have low sodium. He is been drinking alcohol on a daily basis. Tells me he has no family who cares for him. No recent falls.  REVIEW OF SYSTEMS:   Review of Systems  Constitutional:  Negative for chills, fever and weight loss.  HENT:  Negative for ear discharge, ear pain and nosebleeds.   Eyes:  Negative for blurred vision, pain and discharge.  Respiratory:  Negative for sputum production, shortness of breath, wheezing and stridor.   Cardiovascular:  Negative for chest pain, palpitations, orthopnea and PND.  Gastrointestinal:  Negative for abdominal pain, diarrhea, nausea and vomiting.  Genitourinary:  Negative for frequency and urgency.  Musculoskeletal:  Negative for back pain and joint pain.  Neurological:  Positive for weakness. Negative for sensory change, speech change and focal weakness.  Psychiatric/Behavioral:  Negative for depression and hallucinations. The patient is not nervous/anxious.   Tolerating Diet:yes Tolerating PT: pending  DRUG ALLERGIES:   Allergies  Allergen Reactions   Morphine And Related Other (See Comments)    Causes "bad disposition"   Ibuprofen Other (See Comments)    Gi upset    VITALS:  Blood pressure 128/77, pulse 81, temperature (!) 97.4 F (36.3 C), temperature source Oral, resp. rate 12, height 5\' 8"  (1.727 m), weight 77.4 kg, SpO2 97 %.  PHYSICAL EXAMINATION:   Physical Exam  GENERAL:  70 y.o.-year-old patient lying in the bed with no acute distress.  HEENT: Head atraumatic, normocephalic. Oropharynx and nasopharynx clear.  NECK:  Supple, no jugular venous distention. No thyroid enlargement, no tenderness.  LUNGS: Normal breath  sounds bilaterally, no wheezing, rales, rhonchi. No use of accessory muscles of respiration.  CARDIOVASCULAR: S1, S2 normal. No murmurs, rubs, or gallops.  ABDOMEN: Soft, nontender, nondistended. Bowel sounds present. No organomegaly or mass.  EXTREMITIES: No cyanosis, clubbing or edema b/l.    NEUROLOGIC: Cranial nerves II through XII are intact. No focal Motor or sensory deficits b/l.   PSYCHIATRIC:  patient is alert and oriented x 3.  SKIN: No obvious rash, lesion, or ulcer.   LABORATORY PANEL:  CBC Recent Labs  Lab 01/26/21 0500  WBC 8.3  HGB 12.0*  HCT 35.1*  PLT 149*    Chemistries  Recent Labs  Lab 01/26/21 0500  NA 128*  K 4.1  CL 96*  CO2 23  GLUCOSE 124*  BUN 11  CREATININE 0.59*  CALCIUM 8.6*  AST 35  ALT 27  ALKPHOS 91  BILITOT 2.0*   Cardiac Enzymes No results for input(s): TROPONINI in the last 168 hours. RADIOLOGY:  No results found. ASSESSMENT AND PLAN:  Albino Bufford is a 70 y.o. Caucasian male with medical history significant for anxiety, GERD, hypertension, alcohol abuse, who presented to the emergency room for concern about being suicidal.  He was seen by psychiatry and cleared.  He was noted to have hyponatremia of 124. Pt has had poor po intake. Drinks pint of alcohol at home   Hyponatremia - Suspected due to alcohol intake and poor PO intake. Will continue IV fluids. Continue monitor sodium daily. -- Sodium of 124--- 128  Mild alcohol intoxication with history of alcohol abuse. - patient will be monitored  for alcohol withdrawal. - PRN Ativan and Atarax -- patient advised cessation  Depression with suicidal ideation Failure to thrive - continue his Lexapro. - He was assessed by psychiatry and cleared by Rico Junker, NP --pt denies SI today --dietitian to see pt  GERD. - continue PPI therapy.  Tobacco abuse. -  NicoDerm CQ patch    physical therapy to see patient. TOC for discharge planning  Family communication  :none Consults :Psychiatry CODE STATUS: full DVT Prophylaxis :lovenox Level of care: Med-Surg Status is: Inpatient  Remains inpatient appropriate because:Unsafe d/c plan  Dispo: The patient is from: Home              Anticipated d/c is to:  TBD              Patient currently is not medically stable to d/c.   Difficult to place patient No        TOTAL TIME TAKING CARE OF THIS PATIENT: 25 minutes.  >50% time spent on counselling and coordination of care  Note: This dictation was prepared with Dragon dictation along with smaller phrase technology. Any transcriptional errors that result from this process are unintentional.  Enedina Finner M.D    [facility]   CC: Primary care physician; Rayetta Humphrey, MD Patient ID: [name], male   DOB: 11-Mar-1951, 70 y.o.   MRN: [id number]

## 2021-01-26 NOTE — TOC Initial Note (Signed)
Transition of Care [facility]) - Initial/Assessment Note    Patient Details  Name: Chad [name] MRN: [id number] Date of Birth: 1950-10-28  Transition of Care [facility]) CM/SW Contact:    Liliana Cline, LCSW Phone Number: 01/26/2021, 4:21 PM  Clinical Narrative:      CSW spoke to patient regarding discharge planning. Patient came in from [facility] where he was receiving short term rehab. Patient would like to resume short term rehab, prefers to return to [facility]. CSW is starting workup. Patient stated he has had of the 2 COVID shots and 1 booster.             Expected Discharge Plan: Skilled Nursing Facility Barriers to Discharge: Continued Medical Work up   Patient Goals and CMS Choice Patient states their goals for this hospitalization and ongoing recovery are:: wants to go back to [facility] CMS Medicare.gov Compare Post Acute Care list provided to:: Patient Choice offered to / list presented to : Patient  Expected Discharge Plan and Services Expected Discharge Plan: Skilled Nursing Facility       Living arrangements for the past 2 months: Single Family Home, Skilled Nursing Facility                                      Prior Living Arrangements/Services Living arrangements for the past 2 months: Single Family Home, Skilled Nursing Facility Lives with:: Self Patient language and need for interpreter reviewed:: Yes Do you feel safe going back to the place where you live?: Yes      Need for Family Participation in Patient Care: Yes (Comment) Care giver support system in place?: Yes (comment) Current home services: DME Criminal Activity/Legal Involvement Pertinent to Current Situation/Hospitalization: No - Comment as needed  Activities of Daily Living Home Assistive Devices/Equipment: Cane (specify quad or straight), Eyeglasses, Walker (specify type) (straight, 2 front wheeler) ADL Screening (condition at time of admission) Patient's cognitive ability adequate to safely complete daily  activities?: Yes Is the patient deaf or have difficulty hearing?: No Does the patient have difficulty seeing, even when wearing glasses/contacts?: No Does the patient have difficulty concentrating, remembering, or making decisions?: No Patient able to express need for assistance with ADLs?: Yes Does the patient have difficulty dressing or bathing?: Yes Independently performs ADLs?: No Communication: Independent Dressing (OT): Needs assistance Is this a change from baseline?: Pre-admission baseline Grooming: Needs assistance Is this a change from baseline?: Pre-admission baseline Feeding: Independent Bathing: Needs assistance Is this a change from baseline?: Pre-admission baseline Toileting: Needs assistance Is this a change from baseline?: Pre-admission baseline In/Out Bed: Independent, Needs assistance Walks in Home: Needs assistance Is this a change from baseline?: Pre-admission baseline Does the patient have difficulty walking or climbing stairs?: No Weakness of Legs: Both Weakness of Arms/Hands: Both  Permission Sought/Granted Permission sought to share information with : Oceanographer granted to share information with : Yes, Verbal Permission Granted     Permission granted to share info w AGENCY: SNFs        Emotional Assessment       Orientation: : Oriented to Self, Oriented to Place, Oriented to  Time, Oriented to Situation Alcohol / Substance Use: Not Applicable Psych Involvement: Yes (comment)  Admission diagnosis:  Alcohol abuse [F10.10] Hyponatremia [E87.1] Failure to thrive in adult [R62.7] Patient Active Problem List   Diagnosis Date Noted   Hyponatremia 01/26/2021   Failure to thrive in  adult    Secondary adrenal insufficiency (HCC)    Malnutrition of moderate degree 12/28/2020   COVID-19 12/24/2020   Physical deconditioning 12/22/2020   Nicotine dependence 09/27/2020   Weakness 02/04/2020   Alcohol abuse with  intoxication delirium (HCC)    Chest pain    Alcohol abuse 02/20/2019   Alcohol dependence with uncomplicated withdrawal (HCC) 01/23/2019   Alcohol-induced mood disorder (HCC) 01/23/2019   Depression 01/23/2019   Wernicke's encephalopathy 10/18/2018   Acute alcoholic liver disease 10/16/2018   Severe protein-calorie malnutrition (HCC) 10/16/2018   HTN (hypertension) 05/17/2018   Alcohol withdrawal (HCC) 05/17/2018   DVT (deep venous thrombosis) (HCC) 05/17/2018   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   Primary osteoarthritis of both hands 12/29/2017   Psoriasis 12/29/2017   Hepatitis C antibody test positive 10/31/2013   BPH (benign prostatic hyperplasia) 12/15/2011   ED (erectile dysfunction) 12/12/2011   PCP:  Rayetta Humphrey, MD Pharmacy:   [id number] [place], [place] - [address] [place] [address] Phone: [phone] Fax: [phone]  [id number] [name], [place] - [address] AT [facility] OF [address] [place] [address] [place] [address] Phone: [phone] Fax: [phone]     Social Determinants of Health (SDOH) Interventions    Readmission Risk Interventions Readmission Risk Prevention Plan 01/26/2021  Transportation Screening Complete  PCP or Specialist Appt within 3-5 Days Complete  HRI or Home Care Consult Complete  Social Work Consult for Recovery Care Planning/Counseling Complete  Palliative Care Screening Not Applicable  Medication Review Oceanographer) Complete  Some recent data might be hidden

## 2021-01-26 NOTE — [place] FL2 (Signed)
[place] MEDICAID FL2 LEVEL OF CARE SCREENING TOOL     IDENTIFICATION  Patient Name: Chad [name] Birthdate: [birth year] Sex: male Admission Date (Current Location): 01/25/2021  [facility] and [place] Number:  Chiropodist and Address:  [facility], [address] [place], [place] [address]      Provider Number: [id number]  Attending Physician Name and Address:  Enedina Finner, MD  Relative Name and Phone Number:  Larena Glassman (Niece)   [phone] [facility])    Current Level of Care: Hospital Recommended Level of Care: Skilled Nursing Facility Prior Approval Number:    Date Approved/Denied:   PASRR Number: pending  Discharge Plan:      Current Diagnoses: Patient Active Problem List   Diagnosis Date Noted   Hyponatremia 01/26/2021   Failure to thrive in adult    Secondary adrenal insufficiency (HCC)    Malnutrition of moderate degree 12/28/2020   COVID-19 12/24/2020   Physical deconditioning 12/22/2020   Nicotine dependence 09/27/2020   Weakness 02/04/2020   Alcohol abuse with intoxication delirium (HCC)    Chest pain    Alcohol abuse 02/20/2019   Alcohol dependence with uncomplicated withdrawal (HCC) 01/23/2019   Alcohol-induced mood disorder (HCC) 01/23/2019   Depression 01/23/2019   Wernicke's encephalopathy 10/18/2018   Acute alcoholic liver disease 10/16/2018   Severe protein-calorie malnutrition (HCC) 10/16/2018   HTN (hypertension) 05/17/2018   Alcohol withdrawal (HCC) 05/17/2018   DVT (deep venous thrombosis) (HCC) 05/17/2018   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   Primary osteoarthritis of both hands 12/29/2017   Psoriasis 12/29/2017   Hepatitis C antibody test positive 10/31/2013   BPH (benign prostatic hyperplasia) 12/15/2011   ED (erectile dysfunction) 12/12/2011    Orientation RESPIRATION BLADDER Height & Weight     Self, Time, Situation, Place  Normal Continent Weight: 170 lb 10.2 oz (77.4  kg) Height:  5\' 8"  (172.7 cm)  BEHAVIORAL SYMPTOMS/MOOD NEUROLOGICAL BOWEL NUTRITION STATUS      Continent Diet (regular diet, thin liquids)  AMBULATORY STATUS COMMUNICATION OF NEEDS Skin   Limited Assist Verbally Normal                       Personal Care Assistance Level of Assistance  Bathing, Feeding, Dressing Bathing Assistance: Limited assistance Feeding assistance: Limited assistance Dressing Assistance: Limited assistance     Functional Limitations Info             SPECIAL CARE FACTORS FREQUENCY  PT (By licensed PT), OT (By licensed OT)     PT Frequency: 5 x/week OT Frequency: 5 x/week            Contractures Contractures Info: Not present    Additional Factors Info  Code Status, Allergies, Psychotropic Code Status Info: full code Allergies Info: morphine and related, ibuprofen           Current Medications (01/26/2021):  This is the current hospital active medication list Current Facility-Administered Medications  Medication Dose Route Frequency Provider Last Rate Last Admin   0.9 %  sodium chloride infusion   Intravenous Continuous 03/28/2021, MD 75 mL/hr at 01/26/21 1339 New Bag at 01/26/21 1339   acetaminophen (TYLENOL) tablet 650 mg  650 mg Oral Q6H PRN Mansy, Jan A, MD       Or   acetaminophen (TYLENOL) suppository 650 mg  650 mg Rectal Q6H PRN Mansy, 03/28/21, MD       alum & mag hydroxide-simeth (MAALOX/MYLANTA) 200-200-20 MG/5ML suspension 30  mL  30 mL Oral Q8H PRN Enedina Finner, MD   30 mL at 01/26/21 1150   citalopram (CELEXA) tablet 10 mg  10 mg Oral Daily Charm Rings, NP   10 mg at 01/26/21 1333   enoxaparin (LOVENOX) injection 40 mg  40 mg Subcutaneous Q24H Mansy, Jan A, MD   40 mg at 01/26/21 0853   feeding supplement (ENSURE ENLIVE / ENSURE PLUS) liquid 237 mL  237 mL Oral TID BM Mansy, Jan A, MD   237 mL at 01/26/21 1334   folic acid (FOLVITE) tablet 1 mg  1 mg Oral Daily Enedina Finner, MD   1 mg at 01/26/21 0854   gabapentin  (NEURONTIN) capsule 300 mg  300 mg Oral TID Mansy, Jan A, MD   300 mg at 01/26/21 0854   HYDROcodone-acetaminophen (NORCO/VICODIN) 5-325 MG per tablet 1 tablet  1 tablet Oral Q8H PRN Enedina Finner, MD   1 tablet at 01/26/21 1149   hydrOXYzine (ATARAX/VISTARIL) tablet 25 mg  25 mg Oral TID PRN Enedina Finner, MD   25 mg at 01/26/21 1149   [START ON 01/27/2021] influenza vaccine adjuvanted (FLUAD) injection 0.5 mL  0.5 mL Intramuscular Tomorrow-1000 Mansy, Jan A, MD       LORazepam (ATIVAN) injection 1 mg  1 mg Intravenous Q4H PRN Enedina Finner, MD       magnesium hydroxide (MILK OF MAGNESIA) suspension 30 mL  30 mL Oral Daily PRN Mansy, Jan A, MD       multivitamin with minerals tablet 1 tablet  1 tablet Oral Daily Enedina Finner, MD   1 tablet at 01/26/21 0854   ondansetron (ZOFRAN) tablet 4 mg  4 mg Oral Q6H PRN Mansy, Jan A, MD       Or   ondansetron [facility]) injection 4 mg  4 mg Intravenous Q6H PRN Mansy, Jan A, MD       pantoprazole (PROTONIX) EC tablet 40 mg  40 mg Oral Daily Enedina Finner, MD   40 mg at 01/26/21 0854   traZODone (DESYREL) tablet 50 mg  50 mg Oral QHS PRN Mansy, Vernetta Honey, MD         Discharge Medications: Please see discharge summary for a list of discharge medications.  Relevant Imaging Results:  Relevant Lab Results:   Additional Information SS #: [id number]  Nyelle Wolfson E Karsyn Jamie, LCSW

## 2021-01-26 NOTE — Plan of Care (Signed)
  Problem: Health Behavior/Discharge Planning: Goal: Ability to manage health-related needs will improve Outcome: Progressing   Problem: Activity: Goal: Risk for activity intolerance will decrease Outcome: Progressing   Problem: Nutrition: Goal: Adequate nutrition will be maintained Outcome: Progressing   Problem: Coping: Goal: Level of anxiety will decrease Outcome: Progressing   Problem: Pain Managment: Goal: General experience of comfort will improve Outcome: Progressing   Problem: Safety: Goal: Ability to remain free from injury will improve Outcome: Progressing   

## 2021-01-26 NOTE — ED Notes (Signed)
Assumed care; pt report received from Katie, RN. 

## 2021-01-26 NOTE — Evaluation (Signed)
Physical Therapy Evaluation Patient Details Name: Chad [name] MRN: [id number] DOB: 08/06/1950 Today's Date: 01/26/2021  History of Present Illness  Pt is a 70 y/o M admitted on 01/25/21 after presenting to the ED with concerns of being suicidal. Pt was seen & cleared by psychiatry. Pt noted to have hyponatremia. Of note, pt was recently admitted & discharged to [facility] where he left AMA. PMH: anxiety, GERD, HTN, alcohol abuse, lumbar DDD  Clinical Impression  Pt seen for PT evaluation with pt not very open to education despite max encouragement/participation in session. Pt endorses weakness & hip pain but is able to complete bed mobility mod I & transfer to recliner with RW without physical assistance. Pt declines ambulation at this time. Pt does not provide PLOF nor state how he was doing in STR prior to this hospital admission. At this time, will recommend STR upon d/c to maximize independence with functional mobility & reduce fall risk prior to return home.        Recommendations for follow up therapy are one component of a multi-disciplinary discharge planning process, led by the attending physician.  Recommendations may be updated based on patient status, additional functional criteria and insurance authorization.  Follow Up Recommendations SNF;Supervision for mobility/OOB    Equipment Recommendations  None recommended by PT    Recommendations for Other Services       Precautions / Restrictions Precautions Precautions: Fall Restrictions Weight Bearing Restrictions: No      Mobility  Bed Mobility Overal bed mobility: Modified Independent Bed Mobility: Supine to Sit     Supine to sit: Modified independent (Device/Increase time);HOB elevated     General bed mobility comments: Pt received lying partially sideways in bed but is able to reposition himself without assistance.    Transfers Overall transfer level: Needs assistance Equipment used: Rolling walker (2  wheeled) Transfers: Sit to/from [employer] Sit to Stand: Modified independent (Device/Increase time) Stand pivot transfers: Supervision       General transfer comment: poor awareness of safe hand placement as pt with BUE on RW vs pushing to stand  Ambulation/Gait                Stairs            Wheelchair Mobility    Modified Rankin (Stroke Patients Only)       Balance Overall balance assessment: Needs assistance   Sitting balance-Leahy Scale: Normal     Standing balance support: Bilateral upper extremity supported;During functional activity Standing balance-Leahy Scale: Good                               Pertinent Vitals/Pain Pain Assessment: Faces Faces Pain Scale: Hurts a little bit Pain Location: hip Pain Descriptors / Indicators: Discomfort;Dull;Grimacing Pain Intervention(s): Monitored during session    Home Living Family/patient expects to be discharged to:: Unsure (Pt was living alone in apartment with full flight of stairs to access prior to admission. Today, pt reports "send me to a one level nursing home".)   Available Help at Discharge:  (none)                  Prior Function           Comments: Pt resistant to providing PLOF information. Pt does not elaborate on PLOF even when asked what he was working on in [employer], "ask them".     Hand Dominance  Extremity/Trunk Assessment   Upper Extremity Assessment Upper Extremity Assessment: Generalized weakness    Lower Extremity Assessment Lower Extremity Assessment: Generalized weakness    Cervical / Trunk Assessment Cervical / Trunk Assessment: Kyphotic  Communication   Communication: No difficulties  Cognition Arousal/Alertness: Awake/alert Behavior During Therapy: Flat affect (Irritable) Overall Cognitive Status: No family/caregiver present to determine baseline cognitive functioning                                  General Comments: Pt oriented to self & location "hospital" but not aware of current year & name of hospital or city. Does not engage much to attempt to recall orientation answers.      General Comments      Exercises     Assessment/Plan    PT Assessment Patient needs continued PT services  PT Problem List Decreased strength;Decreased mobility;Decreased safety awareness;Decreased activity tolerance;Decreased balance;Decreased knowledge of use of DME;Pain       PT Treatment Interventions Balance training;DME instruction;Gait training;Stair training;Functional mobility training;Therapeutic activities;Neuromuscular re-education;Therapeutic exercise;Patient/family education;Cognitive remediation    PT Goals (Current goals can be found in the Care Plan section)  Acute Rehab PT Goals Patient Stated Goal: pt doesn't state any goals PT Goal Formulation: With patient Time For Goal Achievement: 02/09/21 Potential to Achieve Goals: Fair    Frequency Min 2X/week   Barriers to discharge Inaccessible home environment;Decreased caregiver support lives alone without assistance at home, full flight of stairs to access apartment on 2nd level    Co-evaluation               AM-PAC PT "6 Clicks" Mobility  Outcome Measure Help needed turning from your back to your side while in a flat bed without using bedrails?: None Help needed moving from lying on your back to sitting on the side of a flat bed without using bedrails?: None Help needed moving to and from a bed to a chair (including a wheelchair)?: A Little Help needed standing up from a chair using your arms (e.g., wheelchair or bedside chair)?: None Help needed to walk in hospital room?: A Little Help needed climbing 3-5 steps with a railing? : A Little 6 Click Score: 21    End of Session   Activity Tolerance:  (Pt self limiting, also reports he doesn't feel well) Patient left: in chair;with chair alarm set;with call bell/phone  within reach Nurse Communication: Mobility status PT Visit Diagnosis: Unsteadiness on feet (R26.81);Difficulty in walking, not elsewhere classified (R26.2);Muscle weakness (generalized) (M62.81)    Time: 8182-9937 PT Time Calculation (min) (ACUTE ONLY): 12 min   Charges:   PT Evaluation $PT Eval Moderate Complexity: 1 Mod          Aleda Grana, PT, DPT 01/26/21, 2:57 PM   Sandi Mariscal 01/26/2021, 2:55 PM

## 2021-01-26 NOTE — ED Notes (Signed)
Pt belongings placed in plastic belongings bag: 1-white charging cord 1-white charging block 1-blk cell phone 1-green [employer] cigarette box 1-brown reading glasses, 1-lens and 1/2 frame missing 1-blk reading glasses, 1-lens and 1/2 frame missing 1-worn blk leather wallet  1-$10 bill 4-$1 bills  [place] SS card Medicare card 2-[facility] bank cards 5-miscellaneous discount cards
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
## 2021-01-26 NOTE — ED Notes (Signed)
Pt transported to room 217 via wheelchair by hospital staff. Pt stable. No acute distress noted.

## 2021-01-26 NOTE — BH Assessment (Signed)
Per Psyc NP Lerry Liner patient is Psyc cleared and is recommended for a Social Work Consult for Nursing Home placement

## 2021-01-26 NOTE — ED Notes (Signed)
Pt lying in bed in hallway with eyes open; calm, cooperative. Pt states that he is feeling "not too good". Pt c/o L hip pain, which he rates 8/10 on 0-10 pain scale, due to fracture "about 3 months ago." Pt endorses SI without a plan but denies HI. Pt denies visual hallucinations but reports auditory hallucinations, in which voices say "do it, do it." Pt expresses feelings of anxiety and depression at this time. No acute distress noted.
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
## 2021-01-27 DIAGNOSIS — F1010 Alcohol abuse, uncomplicated: Secondary | ICD-10-CM | POA: Diagnosis not present

## 2021-01-27 DIAGNOSIS — E871 Hypo-osmolality and hyponatremia: Secondary | ICD-10-CM | POA: Diagnosis not present

## 2021-01-27 DIAGNOSIS — F10921 Alcohol use, unspecified with intoxication delirium: Secondary | ICD-10-CM | POA: Diagnosis not present

## 2021-01-27 DIAGNOSIS — R45851 Suicidal ideations: Secondary | ICD-10-CM | POA: Diagnosis not present

## 2021-01-27 LAB — SODIUM: Sodium: 135 mmol/L (ref 135–145)

## 2021-01-27 NOTE — TOC Progression Note (Signed)
Transition of Care [facility]) - Progression Note    Patient Details  Name: Chad [name] MRN: [id number] Date of Birth: 1950-12-28  Transition of Care [facility]) CM/SW Contact  Liliana Cline, LCSW Phone Number: 01/27/2021, 11:26 AM  Clinical Narrative:   Additional info uploaded into [place] [facility] PASRR screen still running.    Expected Discharge Plan: Skilled Nursing Facility Barriers to Discharge: Continued Medical Work up  Expected Discharge Plan and Services Expected Discharge Plan: Skilled Nursing Facility       Living arrangements for the past 2 months: Single Family Home, Skilled Nursing Facility                                       Social Determinants of Health (SDOH) Interventions    Readmission Risk Interventions Readmission Risk Prevention Plan 01/26/2021  Transportation Screening Complete  PCP or Specialist Appt within 3-5 Days Complete  HRI or Home Care Consult Complete  Social Work Consult for Recovery Care Planning/Counseling Complete  Palliative Care Screening Not Applicable  Medication Review [employer]) Complete  Some recent data might be hidden

## 2021-01-27 NOTE — Progress Notes (Signed)
[facility]  - [place] at [facility]   PATIENT NAME: Chad [name]    MR#:  [id number]  DATE OF BIRTH:  [birth year]  SUBJECTIVE:  patient came in with generalized weakness from home. He was recently admitted [facility] to [facility] and left from there AMA. Has not been eating well found to have low sodium. He is been drinking alcohol on a daily basis. Tells me he has no family who cares for him. No recent falls.  No new complaints. Eating better here  REVIEW OF SYSTEMS:   Review of Systems  Constitutional:  Negative for chills, fever and weight loss.  HENT:  Negative for ear discharge, ear pain and nosebleeds.   Eyes:  Negative for blurred vision, pain and discharge.  Respiratory:  Negative for sputum production, shortness of breath, wheezing and stridor.   Cardiovascular:  Negative for chest pain, palpitations, orthopnea and PND.  Gastrointestinal:  Negative for abdominal pain, diarrhea, nausea and vomiting.  Genitourinary:  Negative for frequency and urgency.  Musculoskeletal:  Negative for back pain and joint pain.  Neurological:  Positive for weakness. Negative for sensory change, speech change and focal weakness.  Psychiatric/Behavioral:  Negative for depression and hallucinations. The patient is not nervous/anxious.   Tolerating Diet:yes Tolerating PT: rehab  DRUG ALLERGIES:   Allergies  Allergen Reactions   Morphine And Related Other (See Comments)    Causes "bad disposition"   Ibuprofen Other (See Comments)    Gi upset    VITALS:  Blood pressure 130/78, pulse 79, temperature 97.6 F (36.4 C), temperature source Oral, resp. rate 18, height 5\' 8"  (1.727 m), weight 77.4 kg, SpO2 95 %.  PHYSICAL EXAMINATION:   Physical Exam  GENERAL:  70 y.o.-year-old patient lying in the bed with no acute distress. thin LUNGS: Normal breath sounds bilaterally, no wheezing, rales, rhonchi. No use of accessory muscles of respiration.  CARDIOVASCULAR: S1, S2  normal. No murmurs, rubs, or gallops.  ABDOMEN: Soft, nontender, nondistended. Bowel sounds present. No organomegaly or mass.  EXTREMITIES: No cyanosis, clubbing or edema b/l.    NEUROLOGIC: Cranial nerves II through XII are intact. No focal Motor or sensory deficits b/l.   PSYCHIATRIC:  patient is alert and oriented x 3.  SKIN: No obvious rash, lesion, or ulcer.   LABORATORY PANEL:  CBC Recent Labs  Lab 01/26/21 0500  WBC 8.3  HGB 12.0*  HCT 35.1*  PLT 149*     Chemistries  Recent Labs  Lab 01/26/21 0500 01/27/21 0432  NA 128* 135  K 4.1  --   CL 96*  --   CO2 23  --   GLUCOSE 124*  --   BUN 11  --   CREATININE 0.59*  --   CALCIUM 8.6*  --   AST 35  --   ALT 27  --   ALKPHOS 91  --   BILITOT 2.0*  --     Cardiac Enzymes No results for input(s): TROPONINI in the last 168 hours. RADIOLOGY:  No results found. ASSESSMENT AND PLAN:  Chad [name] is a 70 y.o. Caucasian male with medical history significant for anxiety, GERD, hypertension, alcohol abuse, who presented to the emergency room for concern about being suicidal.  He was seen by psychiatry and cleared.  He was noted to have hyponatremia of 124. Pt has had poor po intake. Drinks pint of alcohol at home   Hyponatremia - Suspected due to alcohol intake and poor PO intake. Will continue IV fluids. Continue  monitor sodium daily. -- Sodium of 124--- 128--135--d/c IVF  Mild alcohol intoxication with history of alcohol abuse. - patient will be monitored for alcohol withdrawal. - PRN Ativan and Atarax -- patient advised cessation  Depression with suicidal ideation Failure to thrive/moderate malnutrition - continue his Lexapro. - He was assessed by psychiatry and cleared by Rico Junker, NP --pt denies SI today --dietitian to see pt--cont po supplements  GERD. - continue PPI therapy.  Tobacco abuse. -  NicoDerm CQ patch    physical therapy to see patient. TOC for discharge planning  Family  communication :none Consults :Psychiatry CODE STATUS: full DVT Prophylaxis :lovenox Level of care: Med-Surg Status is: Inpatient  Remains inpatient appropriate because:Unsafe d/c plan  Dispo: The patient is from: Home              Anticipated d/c is to: rehab              Patient currently is not medically stable to d/c.   Difficult to place patient No        TOTAL TIME TAKING CARE OF THIS PATIENT: 25 minutes.  >50% time spent on counselling and coordination of care  Note: This dictation was prepared with Dragon dictation along with smaller phrase technology. Any transcriptional errors that result from this process are unintentional.  Enedina Finner M.D    [facility]   CC: Primary care physician; Rayetta Humphrey, MD Patient ID: Chad [name], male   DOB: Sep 29, 1950, 70 y.o.   MRN: [id number]

## 2021-01-27 NOTE — Progress Notes (Signed)
Initial Nutrition Assessment  INTERVENTION:   -Ensure Plus PO TID, each provides 350 kcals and 13g protein  -Recommend bowel regimen given continued constipation  NUTRITION DIAGNOSIS:   Increased nutrient needs related to chronic illness as evidenced by estimated needs.  GOAL:   Patient will meet greater than or equal to 90% of their needs  MONITOR:   PO intake, Supplement acceptance, Labs, Weight trends, I & O's  REASON FOR ASSESSMENT:   Consult, Malnutrition Screening Tool Assessment of nutrition requirement/status  ASSESSMENT:   70 y.o. Caucasian male with medical history significant for anxiety, GERD, hypertension, alcohol abuse, who presented to the emergency room for concern about being suicidal.  Patient reporting  poor appetite PTA d/t alcohol use and increased depression. Pt now having constipation. H/o of COVID-19 in September.  Ensure supplements have been ordered.  Currently consuming 75-100% of meals.   Per weight records, no weight loss noted.  Suspect malnutrition continues. H/o moderate malnutrition, diagnosed in September 2022.  Medications: Folic acid, Multivitamin with minerals daily  Labs reviewed.  NUTRITION - FOCUSED PHYSICAL EXAM:  Unable to complete, working remote.  Diet Order:   Diet Order             Diet regular Room service appropriate? Yes; Fluid consistency: Thin  Diet effective now                   EDUCATION NEEDS:   No education needs have been identified at this time  Skin:  Skin Assessment: Reviewed RN Assessment  Last BM:  PTA  Height:   Ht Readings from Last 1 Encounters:  01/26/21 5\' 8"  (1.727 m)    Weight:   Wt Readings from Last 1 Encounters:  01/26/21 77.4 kg    BMI:  Body mass index is 25.95 kg/m.  Estimated Nutritional Needs:   Kcal:  1900-2100  Protein:  95-110g  Fluid:  1.9L/day  03/28/21, MS, RD, LDN Inpatient Clinical Dietitian Contact information available via Amion

## 2021-01-28 DIAGNOSIS — F332 Major depressive disorder, recurrent severe without psychotic features: Secondary | ICD-10-CM

## 2021-01-28 DIAGNOSIS — F1010 Alcohol abuse, uncomplicated: Secondary | ICD-10-CM | POA: Diagnosis not present

## 2021-01-28 DIAGNOSIS — R627 Adult failure to thrive: Secondary | ICD-10-CM | POA: Diagnosis not present

## 2021-01-28 DIAGNOSIS — E871 Hypo-osmolality and hyponatremia: Secondary | ICD-10-CM | POA: Diagnosis not present

## 2021-01-28 DIAGNOSIS — R531 Weakness: Secondary | ICD-10-CM | POA: Diagnosis not present

## 2021-01-28 LAB — SODIUM: Sodium: 133 mmol/L — ABNORMAL LOW (ref 135–145)

## 2021-01-28 MED ORDER — LORAZEPAM 2 MG/ML IJ SOLN
1.0000 mg | Freq: Three times a day (TID) | INTRAMUSCULAR | Status: DC | PRN
  Administered 2021-01-29: 1 mg via INTRAVENOUS
  Filled 2021-01-28: qty 1
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
## 2021-01-28 NOTE — TOC Progression Note (Signed)
Transition of Care [facility]) - Progression Note    Patient Details  Name: Chad [name] MRN: [id number] Date of Birth: June 08, 1950  Transition of Care [facility]) CM/SW Contact  Chapman Fitch, RN Phone Number: 01/28/2021, 3:09 PM  Clinical Narrative:     [facility] not [facility] from [facility]. [facility] prior to [facility] to hospital.  He states "that's a long story" I have spoke with [facility] and they are not able to offer a bed. Patient does not have any bed offers.  Patient now ambulating 200 feet with PT.   With improvement and history of PT it is unlikely that patient's insurance would approved SNF IF there was bed offer   Patient aware.  MD plans to discharge home tomorrow. Patient agreeable to home health services.  Referral made to Char with [facility] to review   Patient states that he does not know if his "friend" will be able to pick him.  With patient's permission I left his niece a voicemail  VM left with DSS supervisor Lorin Picket to determine assigned case worker is  Expected Discharge Plan: Skilled Nursing Facility Barriers to Discharge: Continued Medical Work up  Expected Discharge Plan and Services Expected Discharge Plan: Skilled Nursing Facility       Living arrangements for the past 2 months: Single Family Home, Skilled Nursing Facility                                       Social Determinants of Health (SDOH) Interventions    Readmission Risk Interventions Readmission Risk Prevention Plan 01/26/2021  Transportation Screening Complete  PCP or Specialist Appt within 3-5 Days Complete  HRI or Home Care Consult Complete  Social Work Consult for Recovery Care Planning/Counseling Complete  Palliative Care Screening Not Applicable  Medication Review [employer]) Complete  Some recent data might be hidden

## 2021-01-28 NOTE — Progress Notes (Signed)
Physical Therapy Treatment Patient Details Name: [name] MRN: [id number] DOB: 05-12-1950 Today's Date: 01/28/2021   History of Present Illness Pt is a 70 y/o M admitted on 01/25/21 after presenting to the ED with concerns of being suicidal. Pt was seen & cleared by psychiatry. Pt noted to have hyponatremia. Of note, pt was recently admitted & discharged to [facility] where he left AMA. PMH: anxiety, GERD, HTN, alcohol abuse, lumbar DDD    PT Comments    Patient resting in bed upon arrival to room; agreeable to participation with session with min encouragement from therapist.  Follows commands, but limited active engagement with cuing/education during specific, functional tasks. Able to complete full lap around nursing station with RW, cga; does veer L intermittently, min cuing to maintain pathway. Higher level balance deficits noted with standing activities; does require RW at all times for optimal safety/stability.    Recommendations for follow up therapy are one component of a multi-disciplinary discharge planning process, led by the attending physician.  Recommendations may be updated based on patient status, additional functional criteria and insurance authorization.  Follow Up Recommendations  SNF     Equipment Recommendations       Recommendations for Other Services       Precautions / Restrictions Precautions Precautions: Fall Restrictions Weight Bearing Restrictions: No     Mobility  Bed Mobility Overal bed mobility: Modified Independent Bed Mobility: Supine to Sit     Supine to sit: Modified independent (Device/Increase time)          Transfers Overall transfer level: Needs assistance Equipment used: Rolling walker (2 wheeled) Transfers: Sit to/from Stand Sit to Stand: Min guard Stand pivot transfers: Min guard       General transfer comment: fair/good hand placement with transfers  Ambulation/Gait Ambulation/Gait assistance: Min  guard Gait Distance (Feet): 200 Feet Assistive device: Rolling walker (2 wheeled)       General Gait Details: choppy stepping pattern with limited heel strike/toe off, excessive R LE ER througout gait cycle (with occasional R foot drag); veers L, intermittently bumping obstacles to L 10' walk time, 13 seconds   Stairs             Wheelchair Mobility    Modified Rankin (Stroke Patients Only)       Balance Overall balance assessment: Needs assistance Sitting-balance support: No upper extremity supported;Feet supported Sitting balance-Leahy Scale: Good     Standing balance support: Bilateral upper extremity supported Standing balance-Leahy Scale: Good                              Cognition Arousal/Alertness: Awake/alert Behavior During Therapy: Flat affect Overall Cognitive Status: No family/caregiver present to determine baseline cognitive functioning                                 General Comments: Alert and oriented to basic information; follows commands, but limited engagement with session      Exercises Other Exercises Other Exercises: Standing LE therex, 1x10, active ROM with muscular strength/endurance: ankle pumps, mini squats, marching, hip flex/ext/abduct/adduct.  Requires UE support for all standing activities; limited balance reactions in modified SLS (L > R)    General Comments        Pertinent Vitals/Pain Pain Assessment: Faces Faces Pain Scale: Hurts a little bit Pain Location: L hip Pain Descriptors / Indicators: Discomfort;Dull;Grimacing Pain Intervention(s):  Limited activity within patient's tolerance;Monitored during session;Repositioned    Home Living                      Prior Function            PT Goals (current goals can now be found in the care plan section) Acute Rehab PT Goals Patient Stated Goal: pt doesn't state any goals PT Goal Formulation: With patient Time For Goal Achievement:  02/09/21 Potential to Achieve Goals: Fair Progress towards PT goals: Progressing toward goals    Frequency    Min 2X/week      PT Plan Current plan remains appropriate    Co-evaluation              AM-PAC PT "6 Clicks" Mobility   Outcome Measure  Help needed turning from your back to your side while in a flat bed without using bedrails?: None Help needed moving from lying on your back to sitting on the side of a flat bed without using bedrails?: None Help needed moving to and from a bed to a chair (including a wheelchair)?: A Little Help needed standing up from a chair using your arms (e.g., wheelchair or bedside chair)?: A Little Help needed to walk in hospital room?: A Little Help needed climbing 3-5 steps with a railing? : A Little 6 Click Score: 20    End of Session Equipment Utilized During Treatment: Gait belt Activity Tolerance: Patient tolerated treatment well Patient left: in chair;with chair alarm set;with call bell/phone within reach Nurse Communication: Mobility status PT Visit Diagnosis: Unsteadiness on feet (R26.81);Difficulty in walking, not elsewhere classified (R26.2);Muscle weakness (generalized) (M62.81)     Time: 9379-0240 PT Time Calculation (min) (ACUTE ONLY): 23 min  Charges:  $Gait Training: 8-22 mins $Therapeutic Activity: 8-22 mins                     Marlowe Lawes H. Manson Passey, PT, DPT, NCS 01/28/21, 11:56 AM [phone]

## 2021-01-28 NOTE — Progress Notes (Signed)
[facility]  - [place] [facility]   PATIENT NAME: [name]    MR#:  [id number]  DATE OF BIRTH:  1950-12-17  SUBJECTIVE:  patient came in with generalized weakness from home. He was recently admitted discharged to [facility] and left from there AMA. Has not been eating well found to have low sodium. He is been drinking alcohol on a daily basis. Tells me he has no family who cares for him. No recent falls.  No new complaints. Eating better here Ambulated 200' with PT using RW  REVIEW OF SYSTEMS:   Review of Systems  Constitutional:  Negative for chills, fever and weight loss.  HENT:  Negative for ear discharge, ear pain and nosebleeds.   Eyes:  Negative for blurred vision, pain and discharge.  Respiratory:  Negative for sputum production, shortness of breath, wheezing and stridor.   Cardiovascular:  Negative for chest pain, palpitations, orthopnea and PND.  Gastrointestinal:  Negative for abdominal pain, diarrhea, nausea and vomiting.  Genitourinary:  Negative for frequency and urgency.  Musculoskeletal:  Negative for back pain and joint pain.  Neurological:  Positive for weakness. Negative for sensory change, speech change and focal weakness.  Psychiatric/Behavioral:  Negative for depression and hallucinations. The patient is not nervous/anxious.   Tolerating Diet:yes Tolerating PT: rehab  DRUG ALLERGIES:   Allergies  Allergen Reactions   Morphine And Related Other (See Comments)    Causes "bad disposition"   Ibuprofen Other (See Comments)    Gi upset    VITALS:  Blood pressure 108/65, pulse 92, temperature 98.4 F (36.9 C), temperature source Axillary, resp. rate 18, height 5\' 8"  (1.727 m), weight 77.4 kg, SpO2 98 %.  PHYSICAL EXAMINATION:   Physical Exam  GENERAL:  70 y.o.-year-old patient lying in the bed with no acute distress. thin LUNGS: Normal breath sounds bilaterally, no wheezing, rales, rhonchi. No use of accessory muscles of  respiration.  CARDIOVASCULAR: S1, S2 normal. No murmurs, rubs, or gallops.  ABDOMEN: Soft, nontender, nondistended. Bowel sounds present. No organomegaly or mass.  EXTREMITIES: No cyanosis, clubbing or edema b/l.    NEUROLOGIC: Cranial nerves II through XII are intact. No focal Motor or sensory deficits b/l.   PSYCHIATRIC:  patient is alert and oriented x 3.  SKIN: No obvious rash, lesion, or ulcer.   LABORATORY PANEL:  CBC Recent Labs  Lab 01/26/21 0500  WBC 8.3  HGB 12.0*  HCT 35.1*  PLT 149*     Chemistries  Recent Labs  Lab 01/26/21 0500 01/27/21 0432 01/28/21 0433  NA 128*   < > 133*  K 4.1  --   --   CL 96*  --   --   CO2 23  --   --   GLUCOSE 124*  --   --   BUN 11  --   --   CREATININE 0.59*  --   --   CALCIUM 8.6*  --   --   AST 35  --   --   ALT 27  --   --   ALKPHOS 91  --   --   BILITOT 2.0*  --   --    < > = values in this interval not displayed.    Cardiac Enzymes No results for input(s): TROPONINI in the last 168 hours. RADIOLOGY:  No results found. ASSESSMENT AND PLAN:  Chad [name] is a 69 y.o. Caucasian male with medical history significant for anxiety, GERD, hypertension, alcohol abuse, who presented  to the emergency room for concern about being suicidal.  He was seen by psychiatry and cleared.  He was noted to have hyponatremia of 124. Pt has had poor po intake. Drinks pint of alcohol at home   Hyponatremia - Suspected due to alcohol intake and poor PO intake.  -- Sodium of 124--- 128--135--d/c IVF  Mild alcohol intoxication with history of alcohol abuse. - patient will be monitored for alcohol withdrawal. - PRN Ativan and Atarax -- patient advised cessation  Depression with suicidal ideation Failure to thrive/moderate malnutrition - continue his Lexapro. - He was assessed by psychiatry and cleared by Rico Junker, NP --pt denies SI today --dietitian to see pt--cont po supplements  GERD. - continue PPI therapy.  Tobacco  abuse. -  NicoDerm CQ patch    physical therapy-- recommended rehab.  Patient ambulated 200 feet today with rolling walker.pt is aware that his insurance will not likely approve rehab. He is in agreement to go home with [facility]. D/w TOC-CM, TOC-director  Family communication :none Consults :Psychiatry CODE STATUS: full DVT Prophylaxis :lovenox Level of care: Med-Surg Status is: Inpatient  Remains inpatient appropriate because:Unsafe d/c plan  Dispo: The patient is from: Home              Anticipated d/c is WU:JWJX with HH in am              Patient currently improving   Difficult to place patient No        TOTAL TIME TAKING CARE OF THIS PATIENT: 25 minutes.  >50% time spent on counselling and coordination of care  Note: This dictation was prepared with Dragon dictation along with smaller phrase technology. Any transcriptional errors that result from this process are unintentional.  Enedina Finner M.D    [facility]   CC: Primary care physician; Chad Humphrey, MD Patient ID: Chad [name], male   DOB: 11/26/1950, 70 y.o.   MRN: [id number]

## 2021-01-28 NOTE — Evaluation (Signed)
Occupational Therapy Evaluation Patient Details Name: Chad [name] MRN: [id number] DOB: 01-13-1951 Today's Date: 01/28/2021   History of Present Illness Pt is a 70 y/o M admitted on 01/25/21 after presenting to the ED with concerns of being suicidal. Pt was seen & cleared by psychiatry. Pt noted to have hyponatremia. Of note, pt was recently admitted & discharged to [name] where he left AMA. PMH: anxiety, GERD, HTN, alcohol abuse, lumbar DDD   Clinical Impression   Pt was seen for OT evaluation this date. Prior to hospital admission, pt has recent hx of multiple admissions and was recently at [name] for short term rehab and left after a few days. Pt reports lives alone and has a friend who occasionally will take him to the store or to pick up medications, but otherwise has no social supports in his life. Currently pt demonstrates mild impairments as described below in balance, strength, and activity tolerance (See OT problem list) which functionally limit his safety with ADL/self-care tasks. Pt required overall supervision for bed mobility, ADL transfers (balance and safety improved with use of RW), and toileting while standing without UE support.   Near beginning of the session, pt endorsed not feeling ready to discharge. Pt stated, "I don't feel ready to leave. I have mental [name] issues that need to be addressed first." When OT asked him to elaborate he said, "I have thoughts of hurting myself." When I gently asked if he had a plan, he stated "not really" and "I feel like no one cares, and I'd be better off gone." He endorses that he feels he can do things for himself, like bathing/dressing/toileting/light meal prep but, "it's the will do to them" that's the problem. He reports he's not currently connected with a mental [name] provider in the community. Active listening and emotional support provided throughout session. Pt expressed appreciation. Secure chat sent to the medical team with this  information at end of session.  Pt would benefit from skilled OT services to address noted impairments and functional limitations (see below for any additional details) in order to maximize safety and independence while minimizing falls risk and caregiver burden. Upon hospital discharge, recommend HHOT to maximize pt safety and return to functional independence during meaningful occupations of daily life. Also recommend mental [name] services to address pt's needs.   Recommendations for follow up therapy are one component of a multi-disciplinary discharge planning process, led by the attending physician.  Recommendations may be updated based on patient status, additional functional criteria and insurance authorization.   Follow Up Recommendations  Home [name] OT;Other (comment)    Equipment Recommendations  None recommended by OT    Recommendations for Other Services Other (comment) (mental [name] services)     Precautions / Restrictions Precautions Precautions: Fall Restrictions Weight Bearing Restrictions: No      Mobility Bed Mobility Overal bed mobility: Modified Independent Bed Mobility: Supine to Sit;Sit to Supine     Supine to sit: Modified independent (Device/Increase time) Sit to supine: Modified independent (Device/Increase time)        Transfers Overall transfer level: Needs assistance Equipment used: Rolling walker (2 wheeled) Transfers: Sit to/from Stand Sit to Stand: Min guard              Balance Overall balance assessment: Needs assistance Sitting-balance support: No upper extremity supported;Feet supported Sitting balance-Leahy Scale: Good     Standing balance support: Bilateral upper extremity supported;No upper extremity supported;During functional activity Standing balance-Leahy Scale: Good Standing balance comment: tolerated  standing at the toilet to urinate while standing inside the RW frame but no hands on the RW for stability, no LOB                            ADL either performed or assessed with clinical judgement   ADL Overall ADL's : Needs assistance/impaired     Grooming: Standing;Supervision/safety;Wash/dry Quarry manager and Hygiene: Sit to/from stand;Supervision/safety Toileting - Architect Details (indicate cue type and reason): pt urinated while standing in front of the toilet and within the RW frame             Vision         Perception     Praxis      Pertinent Vitals/Pain Pain Assessment: 0-10 Pain Score: 7  Pain Location: L hip Pain Descriptors / Indicators: Discomfort;Dull;Grimacing Pain Intervention(s): Limited activity within patient's tolerance;Monitored during session;Patient requesting pain meds-RN notified;Repositioned     Hand Dominance     Extremity/Trunk Assessment Upper Extremity Assessment Upper Extremity Assessment: Generalized weakness   Lower Extremity Assessment Lower Extremity Assessment: Generalized weakness       Communication Communication Communication: No difficulties   Cognition Arousal/Alertness: Awake/alert Behavior During Therapy: Flat affect Overall Cognitive Status: No family/caregiver present to determine baseline cognitive functioning                                 General Comments: Alert and oriented to basic information; follows commands, expresses concerns with his mental [name]. When OT entered room and shared OT's role, the pt then stated, "I don't feel ready to leave. I have mental [name] issues that need to be addressed first." When OT asked him to elaborate he said, "I have thoughts of hurting myself." When I gently asked if he had a plan, he stated "not really" and "I feel like no one cares, and I'd be better off gone." He endorses that he feels he can do things for himself, like bathing/dressing/toileting/light meal prep but, "it's the will do to them"  that's the problem. He reports he's not currently connected with a mental [name] provider in the community. Secure chat sent to the medical team with this information.   General Comments       Exercises Other Exercises Other Exercises: Active listening and emotional support provided as pt endorsed difficulties with mental [name]. Pt made aware that OT would share his concerns with the medical team and pt was appreciative. Secure chat send to medical team directly following the session.   Shoulder Instructions      Home Living Family/patient expects to be discharged to:: Unsure (Pt was living alone in apartment with full flight of stairs to access prior to admission. Today, pt reports "send me to a one level nursing home".) Living Arrangements: Alone Available Help at Discharge:  (none)                                    Prior Functioning/Environment      ADL's / Homemaking Assistance Needed: Pt reports independent at baseline, lives alone, and has friend who can help take him to get groceries, medications occasionally.   Comments: Pt provides limited  PLOF information.        OT Problem List: Decreased strength;Decreased activity tolerance;Impaired balance (sitting and/or standing);Decreased cognition      OT Treatment/Interventions: Self-care/ADL training;Therapeutic exercise;Energy conservation;DME and/or AE instruction;Therapeutic activities;Patient/family education;Cognitive remediation/compensation;Balance training    OT Goals(Current goals can be found in the care plan section) Acute Rehab OT Goals Patient Stated Goal: pt reports he wants to get his mental [name] issues addressed OT Goal Formulation: With patient Time For Goal Achievement: 02/11/21 Potential to Achieve Goals: Good ADL Goals Additional ADL Goal #1: Pt will perform morning ADL routine with modified independence. Additional ADL Goal #2: Pt will identify 3 meaningful activities he can participate  in to support improved mental [name]/well-being.  OT Frequency: Min 2X/week   Barriers to D/C: Decreased caregiver support          Co-evaluation              AM-PAC OT "6 Clicks" Daily Activity     Outcome Measure Help from another person eating meals?: None Help from another person taking care of personal grooming?: A Little Help from another person toileting, which includes using toliet, bedpan, or urinal?: A Little Help from another person bathing (including washing, rinsing, drying)?: A Little Help from another person to put on and taking off regular upper body clothing?: None Help from another person to put on and taking off regular lower body clothing?: A Little 6 Click Score: 20   End of Session Equipment Utilized During Treatment: Rolling walker Nurse Communication: Patient requests pain meds;Other (comment) (secure chat to medical team with mental [name] concerns)  Activity Tolerance: Patient tolerated treatment well Patient left: in bed;with call bell/phone within reach;with bed alarm set  OT Visit Diagnosis: Unsteadiness on feet (R26.81);History of falling (Z91.81);Muscle weakness (generalized) (M62.81)                Time: 0962-8366 OT Time Calculation (min): 22 min Charges:  OT General Charges $OT Visit: 1 Visit OT Evaluation $OT Eval Low Complexity: 1 Low OT Treatments $Self Care/Home Management : 8-22 mins  Arman Filter., MPH, MS, OTR/L ascom [phone] 01/28/21, 5:05 PM

## 2021-01-29 ENCOUNTER — Encounter: Payer: Self-pay | Admitting: Family Medicine

## 2021-01-29 DIAGNOSIS — E871 Hypo-osmolality and hyponatremia: Secondary | ICD-10-CM | POA: Diagnosis not present

## 2021-01-29 DIAGNOSIS — R627 Adult failure to thrive: Secondary | ICD-10-CM | POA: Diagnosis not present

## 2021-01-29 DIAGNOSIS — R531 Weakness: Secondary | ICD-10-CM | POA: Diagnosis not present

## 2021-01-29 DIAGNOSIS — F1010 Alcohol abuse, uncomplicated: Secondary | ICD-10-CM | POA: Diagnosis not present

## 2021-01-29 MED ORDER — HYDROCODONE-ACETAMINOPHEN 5-325 MG PO TABS
1.0000 | ORAL_TABLET | Freq: Four times a day (QID) | ORAL | Status: DC | PRN
  Administered 2021-01-29 – 2021-01-30 (×4): 2 via ORAL
  Filled 2021-01-29 (×4): qty 2

## 2021-01-29 NOTE — Progress Notes (Signed)
[facility]  - [place] [facility]   PATIENT NAME: Chad [name]    MR#:  [id number]  DATE OF BIRTH:  Aug 29, 1950  SUBJECTIVE:  patient came in with generalized weakness from home. He was recently admitted discharged to [facility] and left from there AMA. Has not been eating well found to have low sodium. He is been drinking alcohol on a daily basis. Tells me he has no family who cares for him. No recent falls.  No new complaints. Eating better here Ambulated 200' with PT using RW yday Feels depressed  REVIEW OF SYSTEMS:   Review of Systems  Constitutional:  Negative for chills, fever and weight loss.  HENT:  Negative for ear discharge, ear pain and nosebleeds.   Eyes:  Negative for blurred vision, pain and discharge.  Respiratory:  Negative for sputum production, shortness of breath, wheezing and stridor.   Cardiovascular:  Negative for chest pain, palpitations, orthopnea and PND.  Gastrointestinal:  Negative for abdominal pain, diarrhea, nausea and vomiting.  Genitourinary:  Negative for frequency and urgency.  Musculoskeletal:  Negative for back pain and joint pain.  Neurological:  Positive for weakness. Negative for sensory change, speech change and focal weakness.  Psychiatric/Behavioral:  Negative for depression and hallucinations. The patient is not nervous/anxious.   Tolerating Diet:yes Tolerating PT: rehab  DRUG ALLERGIES:   Allergies  Allergen Reactions   Morphine And Related Other (See Comments)    Causes "bad disposition"   Ibuprofen Other (See Comments)    Gi upset    VITALS:  Blood pressure 117/75, pulse 63, temperature 98.8 F (37.1 C), resp. rate 20, height 5\' 8"  (1.727 m), weight 77.4 kg, SpO2 96 %.  PHYSICAL EXAMINATION:   Physical Exam  GENERAL:  70 y.o.-year-old patient lying in the bed with no acute distress. thin LUNGS: Normal breath sounds bilaterally, no wheezing, rales, rhonchi. No use of accessory muscles of respiration.   CARDIOVASCULAR: S1, S2 normal. No murmurs, rubs, or gallops.  ABDOMEN: Soft, nontender, nondistended. Bowel sounds present. No organomegaly or mass.  EXTREMITIES: No cyanosis, clubbing or edema b/l.    NEUROLOGIC: Cranial nerves II through XII are intact. No focal Motor or sensory deficits b/l.   PSYCHIATRIC:  patient is alert and oriented x 3.  SKIN: No obvious rash, lesion, or ulcer.   LABORATORY PANEL:  CBC Recent Labs  Lab 01/26/21 0500  WBC 8.3  HGB 12.0*  HCT 35.1*  PLT 149*     Chemistries  Recent Labs  Lab 01/26/21 0500 01/27/21 0432 01/28/21 0433  NA 128*   < > 133*  K 4.1  --   --   CL 96*  --   --   CO2 23  --   --   GLUCOSE 124*  --   --   BUN 11  --   --   CREATININE 0.59*  --   --   CALCIUM 8.6*  --   --   AST 35  --   --   ALT 27  --   --   ALKPHOS 91  --   --   BILITOT 2.0*  --   --    < > = values in this interval not displayed.    Cardiac Enzymes No results for input(s): TROPONINI in the last 168 hours. RADIOLOGY:  No results found. ASSESSMENT AND PLAN:  Chad [name] is a 70 y.o. Caucasian male with medical history significant for anxiety, GERD, hypertension, alcohol abuse, who presented  to the emergency room for concern about being suicidal.  He was seen by psychiatry and cleared.  He was noted to have hyponatremia of 124. Pt has had poor po intake. Drinks pint of alcohol at home   Hyponatremia - Suspected due to alcohol intake and poor PO intake.  -- Sodium of 124--- 128--135--d/c IVF  Mild alcohol intoxication with history of alcohol abuse. - patient will be monitored for alcohol withdrawal. - PRN Ativan and Atarax -- patient advised cessation  Depression with suicidal ideation Failure to thrive/moderate malnutrition - continue his Lexapro. - He was assessed by psychiatry and cleared by Chad Junker, NP --pt denies SI today --dietitian to see pt--cont po supplements --pt was reevaulated by Dr Toni Amend for ongoing symptoms of  depression and hopelessness.  Dr. Toni Amend recommends inpatient psych transfer.  GERD. - continue PPI therapy.  Tobacco abuse. -  NicoDerm CQ patch   will have mobility therapist ambulate patient if he is doing well will discuss with psychiatry if he can be transferred downstairs to behavioral medicine.  Family communication :none Consults :Psychiatry CODE STATUS: full DVT Prophylaxis :lovenox Level of care: Med-Surg Status is: Inpatient  Remains inpatient appropriate because:Unsafe d/c plan  Dispo: The patient is from: Home              Anticipated d/c is to:TBD              Patient currently improving   Difficult to place patient No        TOTAL TIME TAKING CARE OF THIS PATIENT: 25 minutes.  >50% time spent on counselling and coordination of care  Note: This dictation was prepared with Dragon dictation along with smaller phrase technology. Any transcriptional errors that result from this process are unintentional.  Chad [name] M.D    [facility]   CC: Primary care physician; Chad Humphrey, MD Patient ID: Chad [name], male   DOB: [birth year], 70 y.o.   MRN: [id number]
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
## 2021-01-29 NOTE — Progress Notes (Addendum)
Mobility Specialist - Progress Note   01/29/21 1400  Mobility  Activity Ambulated in hall  Level of Assistance Standby assist, set-up cues, supervision of patient - no hands on  Assistive Device Front wheel walker  Distance Ambulated (ft) 180 ft  Mobility Ambulated with assistance in hallway  Mobility Response Tolerated well  Mobility performed by Mobility specialist  $Mobility charge 1 Mobility    Pre-mobility: 73 HR, 98% SpO2 During mobility: 98 HR, 96% SpO2 Post-mobility: 86 HR, 98% SpO2   Pt c/o pain in L hip 8/10. MinA to exit bed. Does veer L during ambulation, light assist to avoid objects. Denies SOB on RA. No increased pain during ambulation, but does limit further activity.    Filiberto Pinks Mobility Specialist 01/29/21, 2:15 PM

## 2021-01-30 ENCOUNTER — Inpatient Hospital Stay
Admission: EM | Admit: 2021-01-30 | Discharge: 2021-02-27 | DRG: 885 | Disposition: A | Discharge status: Home / Self Care | Payer: Medicare HMO | Attending: Psychiatry | Admitting: Psychiatry

## 2021-01-30 DIAGNOSIS — F10229 Alcohol dependence with intoxication, unspecified: Secondary | ICD-10-CM | POA: Diagnosis present

## 2021-01-30 DIAGNOSIS — F17210 Nicotine dependence, cigarettes, uncomplicated: Secondary | ICD-10-CM | POA: Diagnosis present

## 2021-01-30 DIAGNOSIS — M25552 Pain in left hip: Secondary | ICD-10-CM | POA: Diagnosis present

## 2021-01-30 DIAGNOSIS — R45851 Suicidal ideations: Secondary | ICD-10-CM | POA: Diagnosis present

## 2021-01-30 DIAGNOSIS — K219 Gastro-esophageal reflux disease without esophagitis: Secondary | ICD-10-CM | POA: Diagnosis present

## 2021-01-30 DIAGNOSIS — F332 Major depressive disorder, recurrent severe without psychotic features: Principal | ICD-10-CM | POA: Diagnosis present

## 2021-01-30 DIAGNOSIS — R627 Adult failure to thrive: Secondary | ICD-10-CM | POA: Diagnosis present

## 2021-01-30 DIAGNOSIS — E871 Hypo-osmolality and hyponatremia: Secondary | ICD-10-CM | POA: Diagnosis present

## 2021-01-30 DIAGNOSIS — Z885 Allergy status to narcotic agent status: Secondary | ICD-10-CM

## 2021-01-30 DIAGNOSIS — Z8616 Personal history of COVID-19: Secondary | ICD-10-CM | POA: Diagnosis not present

## 2021-01-30 DIAGNOSIS — Y902 Blood alcohol level of 40-59 mg/100 ml: Secondary | ICD-10-CM | POA: Diagnosis present

## 2021-01-30 DIAGNOSIS — R531 Weakness: Secondary | ICD-10-CM

## 2021-01-30 DIAGNOSIS — I10 Essential (primary) hypertension: Secondary | ICD-10-CM | POA: Diagnosis present

## 2021-01-30 DIAGNOSIS — E878 Other disorders of electrolyte and fluid balance, not elsewhere classified: Secondary | ICD-10-CM | POA: Diagnosis present

## 2021-01-30 DIAGNOSIS — Z79899 Other long term (current) drug therapy: Secondary | ICD-10-CM | POA: Diagnosis not present

## 2021-01-30 DIAGNOSIS — M19041 Primary osteoarthritis, right hand: Secondary | ICD-10-CM | POA: Diagnosis present

## 2021-01-30 DIAGNOSIS — M19042 Primary osteoarthritis, left hand: Secondary | ICD-10-CM | POA: Diagnosis present

## 2021-01-30 DIAGNOSIS — Z886 Allergy status to analgesic agent status: Secondary | ICD-10-CM

## 2021-01-30 DIAGNOSIS — M5136 Other intervertebral disc degeneration, lumbar region: Secondary | ICD-10-CM | POA: Diagnosis present

## 2021-01-30 DIAGNOSIS — Z23 Encounter for immunization: Secondary | ICD-10-CM | POA: Diagnosis present

## 2021-01-30 DIAGNOSIS — Z91199 Patient's noncompliance with other medical treatment and regimen due to unspecified reason: Secondary | ICD-10-CM

## 2021-01-30 DIAGNOSIS — Z811 Family history of alcohol abuse and dependence: Secondary | ICD-10-CM | POA: Diagnosis not present

## 2021-01-30 DIAGNOSIS — G8929 Other chronic pain: Secondary | ICD-10-CM | POA: Diagnosis present

## 2021-01-30 DIAGNOSIS — F10239 Alcohol dependence with withdrawal, unspecified: Secondary | ICD-10-CM | POA: Diagnosis present

## 2021-01-30 DIAGNOSIS — F1010 Alcohol abuse, uncomplicated: Secondary | ICD-10-CM | POA: Diagnosis present

## 2021-01-30 DIAGNOSIS — Z86718 Personal history of other venous thrombosis and embolism: Secondary | ICD-10-CM

## 2021-01-30 DIAGNOSIS — E440 Moderate protein-calorie malnutrition: Secondary | ICD-10-CM | POA: Diagnosis present

## 2021-01-30 DIAGNOSIS — Z6825 Body mass index (BMI) 25.0-25.9, adult: Secondary | ICD-10-CM

## 2021-01-30 DIAGNOSIS — G4700 Insomnia, unspecified: Secondary | ICD-10-CM | POA: Diagnosis present

## 2021-01-30 DIAGNOSIS — Z20822 Contact with and (suspected) exposure to covid-19: Secondary | ICD-10-CM | POA: Diagnosis present

## 2021-01-30 DIAGNOSIS — R4182 Altered mental status, unspecified: Secondary | ICD-10-CM | POA: Diagnosis present

## 2021-01-30 DIAGNOSIS — F411 Generalized anxiety disorder: Secondary | ICD-10-CM | POA: Diagnosis present

## 2021-01-30 MED ORDER — LORAZEPAM 1 MG PO TABS
1.0000 mg | ORAL_TABLET | Freq: Three times a day (TID) | ORAL | Status: DC | PRN
  Administered 2021-01-30 (×2): 1 mg via ORAL
  Filled 2021-01-30 (×2): qty 1

## 2021-01-30 MED ORDER — CITALOPRAM HYDROBROMIDE 10 MG PO TABS
10.0000 mg | ORAL_TABLET | Freq: Every day | ORAL | Status: DC
Start: 2021-01-31 — End: 2021-02-27

## 2021-01-30 NOTE — Progress Notes (Signed)
Physical Therapy Treatment Patient Details Name: Chad [name] MRN: [id number] DOB: 1950-05-01 Today's Date: 01/30/2021   History of Present Illness Pt is a 70 y/o M admitted on 01/25/21 after presenting to the ED with concerns of being suicidal. Pt was seen & cleared by psychiatry. Pt noted to have hyponatremia. Of note, pt was recently admitted & discharged to [facility] where he left AMA. PMH: anxiety, GERD, HTN, alcohol abuse, lumbar DDD    PT Comments    Pt willing to work with PT despite L hip pain and a desire to figure out when he will be transitioning down to the behavioral med floor.  On sitting up pt was found to be covered in urine, assisted with cleaning bed and donning new gown (nursing notified and assisted with linen change and bath).  We did stop by the bathroom and he was able to stand and urinate w/o direct assist and minimal UE use.  Pt was able to circumambulate the nurses' station with slow but consistent cadence.  Similar to prior documentation he has some issues with veering to the L and clearly limping (increased UE reliance) during L stance phase.    Recommendations for follow up therapy are one component of a multi-disciplinary discharge planning process, led by the attending physician.  Recommendations may be updated based on patient status, additional functional criteria and insurance authorization.  Follow Up Recommendations  SNF     Equipment Recommendations  None recommended by PT    Recommendations for Other Services       Precautions / Restrictions Precautions Precautions: Fall Restrictions Weight Bearing Restrictions: No     Mobility  Bed Mobility Overal bed mobility: Modified Independent Bed Mobility: Supine to Sit     Supine to sit: Modified independent (Device/Increase time)          Transfers Overall transfer level: Needs assistance Equipment used: Rolling walker (2 wheeled) Transfers: Sit to/from Stand Sit to Stand: Min  guard         General transfer comment: some cuing to insure appropriate set up, able to rise without assist  Ambulation/Gait Ambulation/Gait assistance: Min guard Gait Distance (Feet): 200 Feet Assistive device: Rolling walker (2 wheeled)       General Gait Details: Biggest issue during ambulation is L WBing tolerance.  Pt with limp and increased UE reliance during L stance phase.  Similar to prior sessions he also had some veering to the L with the effort.   Stairs Stairs:  (deferred, states he doesn't feel up to it at this point)           Wheelchair Mobility    Modified Rankin (Stroke Patients Only)       Balance Overall balance assessment: Needs assistance Sitting-balance support: No upper extremity supported;Feet supported Sitting balance-Leahy Scale: Good     Standing balance support: Bilateral upper extremity supported;No upper extremity supported;During functional activity Standing balance-Leahy Scale: Fair Standing balance comment: able to maintain static standing w/o UEs, dynamic tasks require increased UE use                            Cognition Arousal/Alertness: Awake/alert Behavior During Therapy: WFL for tasks assessed/performed Overall Cognitive Status: Within Functional Limits for tasks assessed  Exercises      General Comments        Pertinent Vitals/Pain Pain Assessment: 0-10 Pain Score: 7  Pain Location: L hip    Home Living                      Prior Function            PT Goals (current goals can now be found in the care plan section) Progress towards PT goals: Progressing toward goals    Frequency    Min 2X/week      PT Plan Current plan remains appropriate    Co-evaluation              AM-PAC PT "6 Clicks" Mobility   Outcome Measure  Help needed turning from your back to your side while in a flat bed without using bedrails?:  None Help needed moving from lying on your back to sitting on the side of a flat bed without using bedrails?: None Help needed moving to and from a bed to a chair (including a wheelchair)?: A Little Help needed standing up from a chair using your arms (e.g., wheelchair or bedside chair)?: A Little Help needed to walk in hospital room?: A Little Help needed climbing 3-5 steps with a railing? : A Little 6 Click Score: 20    End of Session Equipment Utilized During Treatment: Gait belt Activity Tolerance: Patient tolerated treatment well Patient left: in chair;with chair alarm set;with call bell/phone within reach Nurse Communication: Mobility status PT Visit Diagnosis: Unsteadiness on feet (R26.81);Difficulty in walking, not elsewhere classified (R26.2);Muscle weakness (generalized) (M62.81)     Time: 0160-1093 PT Time Calculation (min) (ACUTE ONLY): 18 min  Charges:  $Gait Training: 8-22 mins                     Malachi Pro, DPT 01/30/2021, 1:39 PM

## 2021-01-30 NOTE — Progress Notes (Signed)
Report called to [facility]. Patient discharged with belongings and transported to [facility] via wheelchair with this nurse.

## 2021-01-30 NOTE — Discharge Summary (Signed)
Physician Discharge Summary   Chad [name]  male DOB: Sep 08, 1950  POE:[id number]  PCP: Rayetta Humphrey, MD  Admit date: 01/25/2021 Discharge date: 01/30/2021  Admitted From: home Disposition:  inpatient psych at [facility] CODE STATUS: Full code   Hospital Course:  For full details, please see H&P, progress notes, consult notes and ancillary notes.  Briefly,  Chad [name] is a 70 y.o. Caucasian male with medical history significant for anxiety, hypertension, alcohol abuse, who presented to the emergency room for concern about being suicidal.  He was seen by psychiatry and cleared.  He was noted to have hyponatremia of 124. Pt has had poor po intake. Drinks pints of alcohol at home    Hyponatremia - Suspected due to alcohol intake and poor PO intake.  -- Sodium of 124--- 128--135, improved with IVF.  Mild alcohol intoxication with history of alcohol abuse. - monitored for alcohol withdrawal.  Stable prior to discharge.  Depression with suicidal ideation Failure to thrive/moderate malnutrition --pt was reevaulated by Dr Toni Amend for ongoing symptoms of depression and hopelessness.  Dr. Toni Amend recommended inpatient psych admission.  GERD. - continue PPI therapy.  Tobacco abuse. -  NicoDerm CQ patch   Discharge Diagnoses:  Principal Problem:   Severe recurrent major depression without psychotic features (HCC) Active Problems:   Alcohol abuse   Hyponatremia   Failure to thrive in adult   30 Day Unplanned Readmission Risk Score    Flowsheet Row ED to Hosp-Admission (Current) from 01/25/2021 in [facility]  30 Day Unplanned Readmission Risk Score (%) 22.32 Filed at 01/30/2021 1600       This score is the patient's risk of an unplanned readmission within 30 days of being discharged (0 -100%). The score is based on dignosis, age, lab data, medications, orders, and past utilization.   Low:  0-14.9   Medium: 15-21.9   High: 22-29.9    Extreme: 30 and above         Discharge Instructions:  Allergies as of 01/30/2021       Reactions   Morphine And Related Other (See Comments)   Causes "bad disposition"   Ibuprofen Other (See Comments)   Gi upset        Medication List     TAKE these medications    acetaminophen 325 MG tablet Commonly known as: TYLENOL Take 2 tablets (650 mg total) by mouth every 6 (six) hours as needed for mild pain (pain score 1-3 or temp > 100.5).   citalopram 10 MG tablet Commonly known as: CELEXA Take 1 tablet (10 mg total) by mouth daily. Start taking on: January 31, 2021   feeding supplement Liqd Take 237 mLs by mouth 3 (three) times daily between meals.   folic acid 1 MG tablet Commonly known as: FOLVITE Take 1 tablet (1 mg total) by mouth daily.   gabapentin 300 MG capsule Commonly known as: NEURONTIN Take 1 capsule (300 mg total) by mouth 3 (three) times daily.   HYDROcodone-acetaminophen 5-325 MG tablet Commonly known as: NORCO/VICODIN Take 1-2 tablets by mouth every 6 (six) hours as needed for moderate pain or severe pain.   hydrOXYzine 25 MG tablet Commonly known as: ATARAX/VISTARIL Take 1 tablet (25 mg total) by mouth 3 (three) times daily as needed for anxiety.   multivitamin with minerals Tabs tablet Take 1 tablet by mouth daily.   pantoprazole 40 MG tablet Commonly known as: Protonix Take 1 tablet (40 mg total) by mouth daily.  traZODone 150 MG tablet Commonly known as: DESYREL Take 1 tablet (150 mg total) by mouth at bedtime.          Allergies  Allergen Reactions   Morphine And Related Other (See Comments)    Causes "bad disposition"   Ibuprofen Other (See Comments)    Gi upset     The results of significant diagnostics from this hospitalization (including imaging, microbiology, ancillary and laboratory) are listed below for reference.   Consultations:   Procedures/Studies: No results found.    Labs: BNP (last 3  results) Recent Labs    08/01/20 1127  BNP 184.4*   Basic Metabolic Panel: Recent Labs  Lab 01/25/21 1713 01/26/21 0017 01/26/21 0500 01/27/21 0432 01/28/21 0433  NA 124* 125* 128* 135 133*  K 4.8 4.2 4.1  --   --   CL 91* 95* 96*  --   --   CO2 13* 21* 23  --   --   GLUCOSE 111* 119* 124*  --   --   BUN 14 12 11   --   --   CREATININE 0.65 0.71 0.59*  --   --   CALCIUM 8.7* 8.0* 8.6*  --   --    Liver Function Tests: Recent Labs  Lab 01/25/21 1713 01/26/21 0500  AST 39 35  ALT 30 27  ALKPHOS 115 91  BILITOT 1.6* 2.0*  PROT 6.9 5.7*  ALBUMIN 3.9 3.4*   No results for input(s): LIPASE, AMYLASE in the last 168 hours. No results for input(s): AMMONIA in the last 168 hours. CBC: Recent Labs  Lab 01/25/21 1713 01/26/21 0500  WBC 10.0 8.3  HGB 14.5 12.0*  HCT 46.8 35.1*  MCV 97.7 91.4  PLT 192 149*   Cardiac Enzymes: No results for input(s): CKTOTAL, CKMB, CKMBINDEX, TROPONINI in the last 168 hours. BNP: Invalid input(s): POCBNP CBG: No results for input(s): GLUCAP in the last 168 hours. D-Dimer No results for input(s): DDIMER in the last 72 hours. Hgb A1c No results for input(s): HGBA1C in the last 72 hours. Lipid Profile No results for input(s): CHOL, HDL, LDLCALC, TRIG, CHOLHDL, LDLDIRECT in the last 72 hours. Thyroid function studies No results for input(s): TSH, T4TOTAL, T3FREE, THYROIDAB in the last 72 hours.  Invalid input(s): FREET3 Anemia work up No results for input(s): VITAMINB12, FOLATE, FERRITIN, TIBC, IRON, RETICCTPCT in the last 72 hours. Urinalysis    Component Value Date/Time   COLORURINE YELLOW (A) 12/21/2020 2142   APPEARANCEUR CLEAR (A) 12/21/2020 2142   LABSPEC 1.025 12/21/2020 2142   PHURINE 5.0 12/21/2020 2142   GLUCOSEU NEGATIVE 12/21/2020 2142   HGBUR NEGATIVE 12/21/2020 2142   BILIRUBINUR NEGATIVE 12/21/2020 2142   KETONESUR NEGATIVE 12/21/2020 2142   PROTEINUR NEGATIVE 12/21/2020 2142   NITRITE NEGATIVE 12/21/2020 2142    LEUKOCYTESUR NEGATIVE 12/21/2020 2142   Sepsis Labs Invalid input(s): PROCALCITONIN,  WBC,  LACTICIDVEN Microbiology Recent Results (from the past 240 hour(s))  Resp Panel by RT-PCR (Flu A&B, Covid) Nasopharyngeal Swab     Status: None   Collection Time: 01/26/21 12:17 AM   Specimen: Nasopharyngeal Swab; Nasopharyngeal(NP) swabs in vial transport medium  Result Value Ref Range Status   SARS Coronavirus 2 by RT PCR NEGATIVE NEGATIVE Final    Comment: (NOTE) SARS-CoV-2 target nucleic acids are NOT DETECTED.  The SARS-CoV-2 RNA is generally detectable in upper respiratory specimens during the acute phase of infection. The lowest concentration of SARS-CoV-2 viral copies this assay can detect is 138 copies/mL. A negative  result does not preclude SARS-Cov-2 infection and should not be used as the sole basis for treatment or other patient management decisions. A negative result may occur with  improper specimen collection/handling, submission of specimen other than nasopharyngeal swab, presence of viral mutation(s) within the areas targeted by this assay, and inadequate number of viral copies(<138 copies/mL). A negative result must be combined with clinical observations, patient history, and epidemiological information. The expected result is Negative.  Fact Sheet for Patients:  BloggerCourse.com  Fact Sheet for Healthcare Providers:  SeriousBroker.it  This test is no t yet approved or cleared by the [place] FDA and  has been authorized for detection and/or diagnosis of SARS-CoV-2 by FDA under an Emergency Use Authorization (EUA). This EUA will remain  in effect (meaning this test can be used) for the duration of the COVID-19 declaration under Section 564(b)(1) of the Act, 21 U.S.C.section 360bbb-3(b)(1), unless the authorization is terminated  or revoked sooner.       Influenza A by PCR NEGATIVE NEGATIVE Final   Influenza  B by PCR NEGATIVE NEGATIVE Final    Comment: (NOTE) The Xpert Xpress SARS-CoV-2/FLU/RSV plus assay is intended as an aid in the diagnosis of influenza from Nasopharyngeal swab specimens and should not be used as a sole basis for treatment. Nasal washings and aspirates are unacceptable for Xpert Xpress SARS-CoV-2/FLU/RSV testing.  Fact Sheet for Patients: BloggerCourse.com  Fact Sheet for Healthcare Providers: SeriousBroker.it  This test is not yet approved or cleared by the [place] FDA and has been authorized for detection and/or diagnosis of SARS-CoV-2 by FDA under an Emergency Use Authorization (EUA). This EUA will remain in effect (meaning this test can be used) for the duration of the COVID-19 declaration under Section 564(b)(1) of the Act, 21 U.S.C. section 360bbb-3(b)(1), unless the authorization is terminated or revoked.  Performed at [facility], [address] [place], [place] [address]      Total time spend on discharging this patient, including the last patient exam, discussing the hospital stay, instructions for ongoing care as it relates to all pertinent caregivers, as well as preparing the medical discharge records, prescriptions, and/or referrals as applicable, is 35 minutes.    Darlin Priestly, MD  [facility] 01/30/2021, 4:44 PM

## 2021-01-31 ENCOUNTER — Encounter: Payer: Self-pay | Admitting: Behavioral Health

## 2021-01-31 ENCOUNTER — Other Ambulatory Visit: Payer: Self-pay

## 2021-01-31 DIAGNOSIS — I10 Essential (primary) hypertension: Secondary | ICD-10-CM

## 2021-01-31 DIAGNOSIS — F332 Major depressive disorder, recurrent severe without psychotic features: Principal | ICD-10-CM

## 2021-01-31 MED ORDER — FOLIC ACID 1 MG PO TABS
1.0000 mg | ORAL_TABLET | Freq: Every day | ORAL | Status: DC
  Administered 2021-01-31 – 2021-02-27 (×28): 1 mg via ORAL
  Filled 2021-01-31 (×29): qty 1

## 2021-01-31 MED ORDER — ALUM & MAG HYDROXIDE-SIMETH 200-200-20 MG/5ML PO SUSP: 30.0000 mL | Freq: Three times a day (TID) | ORAL | Status: DC | PRN

## 2021-01-31 MED ORDER — CITALOPRAM HYDROBROMIDE 20 MG PO TABS
10.0000 mg | ORAL_TABLET | Freq: Every day | ORAL | Status: DC
  Administered 2021-01-31 – 2021-02-01 (×2): 10 mg via ORAL
  Filled 2021-01-31 (×2): qty 1

## 2021-01-31 MED ORDER — ACETAMINOPHEN 325 MG PO TABS: 650.0000 mg | ORAL_TABLET | Freq: Four times a day (QID) | ORAL | Status: DC | PRN

## 2021-01-31 MED ORDER — MAGNESIUM HYDROXIDE 400 MG/5ML PO SUSP: 30.0000 mL | Freq: Every day | ORAL | Status: DC | PRN

## 2021-01-31 MED ORDER — MAGNESIUM HYDROXIDE 400 MG/5ML PO SUSP
30.0000 mL | Freq: Every day | ORAL | Status: DC | PRN
  Administered 2021-02-01: 30 mL via ORAL
  Filled 2021-01-31: qty 30

## 2021-01-31 MED ORDER — TRAZODONE HCL 50 MG PO TABS
50.0000 mg | ORAL_TABLET | Freq: Every evening | ORAL | Status: DC | PRN
  Administered 2021-01-31 – 2021-02-16 (×14): 50 mg via ORAL
  Filled 2021-01-31 (×15): qty 1

## 2021-01-31 MED ORDER — HYDROCODONE-ACETAMINOPHEN 5-325 MG PO TABS
1.0000 | ORAL_TABLET | Freq: Four times a day (QID) | ORAL | Status: DC | PRN
  Administered 2021-01-31: 1 via ORAL
  Administered 2021-01-31 (×2): 2 via ORAL
  Administered 2021-01-31: 1 via ORAL
  Administered 2021-02-01 – 2021-02-02 (×5): 2 via ORAL
  Administered 2021-02-03: 1 via ORAL
  Administered 2021-02-03 (×4): 2 via ORAL
  Administered 2021-02-03: 1 via ORAL
  Administered 2021-02-04 (×3): 2 via ORAL
  Administered 2021-02-05 (×3): 1 via ORAL
  Administered 2021-02-05: 2 via ORAL
  Administered 2021-02-05: 1 via ORAL
  Administered 2021-02-06 – 2021-02-08 (×9): 2 via ORAL
  Administered 2021-02-09: 1 via ORAL
  Administered 2021-02-09 – 2021-02-27 (×54): 2 via ORAL
  Filled 2021-01-31 (×7): qty 2
  Filled 2021-01-31: qty 1
  Filled 2021-01-31 (×5): qty 2
  Filled 2021-01-31: qty 1
  Filled 2021-01-31 (×25): qty 2
  Filled 2021-01-31: qty 1
  Filled 2021-01-31 (×2): qty 2
  Filled 2021-01-31: qty 1
  Filled 2021-01-31 (×7): qty 2
  Filled 2021-01-31: qty 1
  Filled 2021-01-31 (×2): qty 2
  Filled 2021-01-31: qty 1
  Filled 2021-01-31 (×8): qty 2
  Filled 2021-01-31: qty 1
  Filled 2021-01-31 (×4): qty 2
  Filled 2021-01-31: qty 1
  Filled 2021-01-31 (×2): qty 2
  Filled 2021-01-31: qty 1
  Filled 2021-01-31 (×7): qty 2
  Filled 2021-01-31: qty 1
  Filled 2021-01-31 (×4): qty 2
  Filled 2021-01-31: qty 1
  Filled 2021-01-31 (×4): qty 2
  Filled 2021-01-31: qty 1

## 2021-01-31 MED ORDER — ADULT MULTIVITAMIN W/MINERALS CH
1.0000 | ORAL_TABLET | Freq: Every day | ORAL | Status: DC
  Administered 2021-01-31 – 2021-02-27 (×29): 1 via ORAL
  Filled 2021-01-31 (×28): qty 1

## 2021-01-31 MED ORDER — HYDROXYZINE HCL 25 MG PO TABS
25.0000 mg | ORAL_TABLET | Freq: Three times a day (TID) | ORAL | Status: DC | PRN
  Administered 2021-01-31 – 2021-02-26 (×26): 25 mg via ORAL
  Filled 2021-01-31 (×29): qty 1

## 2021-01-31 MED ORDER — ONDANSETRON HCL 4 MG PO TABS
8.0000 mg | ORAL_TABLET | Freq: Three times a day (TID) | ORAL | Status: DC | PRN
  Administered 2021-02-11: 8 mg via ORAL
  Filled 2021-01-31: qty 2

## 2021-01-31 MED ORDER — GABAPENTIN 300 MG PO CAPS
300.0000 mg | ORAL_CAPSULE | Freq: Three times a day (TID) | ORAL | Status: DC
  Administered 2021-01-31 – 2021-02-27 (×75): 300 mg via ORAL
  Filled 2021-01-31 (×75): qty 1

## 2021-01-31 MED ORDER — PANTOPRAZOLE SODIUM 40 MG PO TBEC
40.0000 mg | DELAYED_RELEASE_TABLET | Freq: Every day | ORAL | Status: DC
  Administered 2021-01-31 – 2021-02-27 (×28): 40 mg via ORAL
  Filled 2021-01-31 (×29): qty 1

## 2021-01-31 MED ORDER — THIAMINE HCL 100 MG PO TABS
100.0000 mg | ORAL_TABLET | Freq: Every day | ORAL | Status: DC
  Administered 2021-01-31 – 2021-02-27 (×28): 100 mg via ORAL
  Filled 2021-01-31 (×28): qty 1

## 2021-01-31 MED ORDER — ENSURE ENLIVE PO LIQD
237.0000 mL | Freq: Three times a day (TID) | ORAL | Status: DC
  Administered 2021-01-31 – 2021-02-27 (×74): 237 mL via ORAL

## 2021-01-31 MED ORDER — ALUM & MAG HYDROXIDE-SIMETH 200-200-20 MG/5ML PO SUSP: 30.0000 mL | ORAL | Status: DC | PRN
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
## 2021-01-31 NOTE — [facility] Suicide Risk Assessment (Signed)
[facility] INPATIENT:  Family/Significant Other Suicide Prevention Education  Suicide Prevention Education: SPE completed with pt, as pt refused to consent to family contact. SPI pamphlet provided to pt and pt was encouraged to share information with support network, ask questions, and talk about any concerns relating to SPE. Pt denies access to guns/firearms and verbalized understanding of information provided. Mobile Crisis information also provided to pt.   Patient Refusal for Family/Significant Other Suicide Prevention Education: The patient Chad [name] has refused to provide written consent for family/significant other to be provided Family/Significant Other Suicide Prevention Education during admission and/or prior to discharge.  Physician notified.  Nemesis Rainwater A [place] 01/31/2021, 3:52 PM
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
## 2021-01-31 NOTE — Progress Notes (Signed)
Recreation Therapy Notes  Date: 01/31/2021  Time: 10:30 am   Location: Craft room    Behavioral response: N/A   Intervention Topic: Decision Making   Discussion/Intervention: Patient did not attend group.   Clinical Observations/Feedback:  Patient did not attend group.   Carliss Quast LRT/CTRS          Zamiah Tollett 01/31/2021 11:14 AM

## 2021-01-31 NOTE — Group Note (Signed)
LCSW Group Therapy Note   Group Date: 01/31/2021 Start Time: 1300 End Time: 1400   Type of Therapy and Topic:  Group Therapy: Boundaries  Participation Level:  Did Not Attend  Description of Group: This group will address the use of boundaries in their personal lives. Patients will explore why boundaries are important, the difference between healthy and unhealthy boundaries, and negative and postive outcomes of different boundaries and will look at how boundaries can be crossed.  Patients will be encouraged to identify current boundaries in their own lives and identify what kind of boundary is being set. Facilitators will guide patients in utilizing problem-solving interventions to address and correct types boundaries being used and to address when no boundary is being used. Understanding and applying boundaries will be explored and addressed for obtaining and maintaining a balanced life. Patients will be encouraged to explore ways to assertively make their boundaries and needs known to significant others in their lives, using other group members and facilitator for role play, support, and feedback.  Therapeutic Goals:  1.  Patient will identify areas in their life where setting clear boundaries could be  used to improve their life.  2.  Patient will identify signs/triggers that a boundary is not being respected. 3.  Patient will identify two ways to set boundaries in order to achieve balance in  their lives: 4.  Patient will demonstrate ability to communicate their needs and set boundaries  through discussion and/or role plays  Summary of Patient Progress:   Patient did not attend group despite encouraged participation.    Therapeutic Modalities:   Cognitive Behavioral Therapy Solution-Focused Therapy  Decorian W Dollie Bressi, LCSWA 01/31/2021  2:42 PM    

## 2021-01-31 NOTE — Evaluation (Addendum)
Physical Therapy Evaluation Patient Details Name: Chad [name] MRN: [id number] DOB: 1951-03-19 Today's Date: 01/31/2021  History of Present Illness  Pt is a 70 y.o. M presenting to ED for suicidal ideation and admitted to behavioral health and for mild alcohol intoxiation & hypoatrmia. PT orders for h L hip pain and unstable gait. PMH includes L intertrochantric IM nail (2021), anxiety, HTN, DDD.  Clinical Impression  Pt asleep in bed, agreeable to therapy with encouragement. Pt is very pleasant and cooperative throughout treatment. Pt states PLOF as independence w/ MOD-I w/ RW or SPC (SPC for community distances), and a long hx of L hip pain. Evaluation reveals primary deficits in L knee extension and hip internal rotation strength w/ associated pain. Pt ambulates trendelenburg gait and increased tibial ER without instability or LOB w/ AD. Provided exercises for strengthening exercises to better support hip joint in a pain free ranges. Pt encouraged to continue follow up outpatient PT at discharge. Skilled PT intervention is indicated to address deficits in function, mobility, and to return to PLOF as able.       Recommendations for follow up therapy are one component of a multi-disciplinary discharge planning process, led by the attending physician.  Recommendations may be updated based on patient status, additional functional criteria and insurance authorization.  Follow Up Recommendations Outpatient PT    Equipment Recommendations  None recommended by PT    Recommendations for Other Services       Precautions / Restrictions Precautions Precautions: Fall Restrictions Weight Bearing Restrictions: No      Mobility  Bed Mobility Overal bed mobility: Needs Assistance Bed Mobility: Supine to Sit     Supine to sit: Supervision Sit to supine: Modified independent (Device/Increase time)   General bed mobility comments: Cues for rolling technique vs straight ab/HF sit up d/t LB pain  coming to EOB. Pt stated "that feels better" with technique.    Transfers Overall transfer level: Modified independent Equipment used: Rolling walker (2 wheeled) Transfers: Sit to/from Stand Sit to Stand: Modified independent (Device/Increase time)         General transfer comment: Able to stand without physical assist from lowered surface with correct hand placement on RW  Ambulation/Gait Ambulation/Gait assistance: Min guard Gait Distance (Feet): 45 Feet Assistive device: Rolling walker (2 wheeled) Gait Pattern/deviations: Decreased step length - right;Decreased step length - left;Trunk flexed     General Gait Details: Min Gfor weight distrbutin requiring tactile assist for trunk, no LOB noted; pt demonstrates increased R tibial ER to compensate for decreased DF with bilateral trendelenburg  Stairs            Wheelchair Mobility    Modified Rankin (Stroke Patients Only)       Balance Overall balance assessment: Needs assistance Sitting-balance support: No upper extremity supported;Feet supported Sitting balance-Leahy Scale: Good Sitting balance - Comments: No LOB statically or dynmically   Standing balance support: Bilateral upper extremity supported;During functional activity Standing balance-Leahy Scale: Fair Standing balance comment: Does require BUE support for dynmic tasks; able to stand without UE support statically                             Pertinent Vitals/Pain Pain Assessment: 0-10 Pain Score: 7  Pain Location: L hip over greater trochanter, low back pain w/ extension Pain Descriptors / Indicators: Aching;Sore Pain Intervention(s): Limited activity within patient's tolerance;Monitored during session;Repositioned    Home Living Family/patient expects to be discharged to::  Private residence Living Arrangements: Alone   Type of Home: Apartment Home Access: Stairs to enter Entrance Stairs-Rails: Right;Left;Can reach both Entrance  Stairs-Number of Steps: 14 Home Layout: One level Home Equipment: Walker - 2 wheels;Cane - single point      Prior Function Level of Independence: Independent with assistive device(s)      ADL's / Homemaking Assistance Needed: Pt states walking with an AD secondary to LBP and hip pain. Pt will occasionally require assistance for errands but managing on own primarily        Hand Dominance        Extremity/Trunk Assessment   Upper Extremity Assessment Upper Extremity Assessment: Generalized weakness    Lower Extremity Assessment Lower Extremity Assessment: RLE deficits/detail;LLE deficits/detail RLE Deficits / Details: MMT 5/5 Knee extension, hip IR & ER; 4/5 hip flexion RLE Sensation: WNL LLE Deficits / Details: MMT 3/5 knee extension w/ pain; 4/5 hip IR w/ pain; 4/5 hip ER w/o pain LLE Sensation: WNL    Cervical / Trunk Assessment Cervical / Trunk Assessment: Kyphotic  Communication   Communication: No difficulties  Cognition Arousal/Alertness: Awake/alert Behavior During Therapy: WFL for tasks assessed/performed Overall Cognitive Status: Within Functional Limits for tasks assessed                                 General Comments: AOx4, pleasant and cooperative throughout treament. Requires a little encouragement for OOB mobility but polite and willing overall.      General Comments      Exercises Other Exercises Other Exercises: Provided HEP to increased KE and hip abduction strength: clamshells x 10 LLE w/ towel roll between knees to decrease gravity 2/2 to pain, Seated knee AROM w/ resistance for LLE strength. Discussed outpatient PT to further address MSK impairments   Assessment/Plan    PT Assessment Patient needs continued PT services  PT Problem List Decreased strength;Decreased mobility;Decreased safety awareness;Decreased activity tolerance;Decreased balance;Pain;Decreased range of motion       PT Treatment Interventions Balance  training;DME instruction;Gait training;Stair training;Functional mobility training;Therapeutic activities;Neuromuscular re-education;Therapeutic exercise;Patient/family education;Cognitive remediation    PT Goals (Current goals can be found in the Care Plan section)  Acute Rehab PT Goals Patient Stated Goal: to decrease hip pain PT Goal Formulation: With patient Time For Goal Achievement: 02/14/21 Potential to Achieve Goals: Good Additional Goals Additional Goal #1: Pt will be compliant with HEP to improve strength necessary for functional ADLS    Frequency Min 2X/week   Barriers to discharge        Co-evaluation               AM-PAC PT "6 Clicks" Mobility  Outcome Measure Help needed turning from your back to your side while in a flat bed without using bedrails?: None Help needed moving from lying on your back to sitting on the side of a flat bed without using bedrails?: A Little Help needed moving to and from a bed to a chair (including a wheelchair)?: None Help needed standing up from a chair using your arms (e.g., wheelchair or bedside chair)?: A Little Help needed to walk in hospital room?: A Little Help needed climbing 3-5 steps with a railing? : A Lot 6 Click Score: 19    End of Session Equipment Utilized During Treatment: Gait belt Activity Tolerance: Patient tolerated treatment well Patient left: in bed;with call bell/phone within reach;with chair alarm set Nurse Communication: Mobility status PT Visit Diagnosis:  Unsteadiness on feet (R26.81);Difficulty in walking, not elsewhere classified (R26.2);Muscle weakness (generalized) (M62.81)    Time: 1009-1040 PT Time Calculation (min) (ACUTE ONLY): 31 min   Charges:   PT Evaluation $PT Eval Low Complexity: 1 Low PT Treatments $Therapeutic Exercise: 8-22 mins $Therapeutic Activity: 8-22 mins       [employer], SPT

## 2021-01-31 NOTE — Progress Notes (Signed)
D: Pt alert and oriented. Pt rates depression 8/10 and anxiety 8/10. Pt reports experiencing 8/10 left hip pain. Pt denies experiencing any SI/HI, or AVH at this time.   Pt observed in the milieu for meals and went outside this afternoon. PT worked with pt today and did well. Pt was also visited by a state rep to sign and review paperwork. Pt was calmer this afternoon in comparison to this morning.   A: Scheduled medications administered to pt, per MD orders. Support and encouragement provided. Frequent verbal contact made. Routine safety checks conducted q15 minutes.   R: No adverse drug reactions noted. Pt verbally contracts for safety at this time. Pt complaint with medications. Pt interacts minimally with others on the unit. Pt remains safe at this time. Will continue to monitor.

## 2021-01-31 NOTE — [facility] Suicide Risk Assessment (Signed)
[facility] Admission Suicide Risk Assessment   Nursing information obtained from:  Patient Demographic factors:  Male, Age 70 or older, Divorced or widowed, Caucasian, Low socioeconomic status, Unemployed, Living alone Current Mental Status:  Self-harm thoughts Loss Factors:  Decline in physical health, Financial problems / change in socioeconomic status Historical Factors:  NA Risk Reduction Factors:  Positive coping skills or problem solving skills  Total Time spent with patient: 1 hour Principal Problem: HTN (hypertension) Diagnosis:  Principal Problem:   HTN (hypertension) Active Problems:   Alcohol abuse   Weakness   Hyponatremia   Severe recurrent major depression without psychotic features (HCC)  Subjective Data: 70 year old male with alcohol use disorder and depression presenting for worsening mood and suicidal ideations. He was hospitalized on the medical floor for hyponatremia, hypochloremia. Once stabilized, he continued to endorse suicidal ideations and was admitted to our unit. This morning patient continues to endorse passive suicidal ideations, but is able to contract for safety. He denies HI/AH/VH. Denies medication side effects. He endorses depression, anxiety, hopelessness. Also complaining of left hip pain, and notes he was getting Norco for the pain. This was confirmed via chart review and restarted. He also requests Ativan for anxiety. However, contraindicated with narcotics. Vistaril PRN for anxiety provided instead.   Continued Clinical Symptoms:  Alcohol Use Disorder Identification Test Final Score (AUDIT): 12 The "Alcohol Use Disorders Identification Test", Guidelines for Use in Primary Care, Second Edition.  World Science writer [facility]). Score between 0-7:  no or low risk or alcohol related problems. Score between 8-15:  moderate risk of alcohol related problems. Score between 16-19:  high risk of alcohol related problems. Score 20 or above:  warrants further  diagnostic evaluation for alcohol dependence and treatment.   CLINICAL FACTORS:   Severe Anxiety and/or Agitation Depression:   Comorbid alcohol abuse/dependence Hopelessness Insomnia Severe Alcohol/Substance Abuse/Dependencies Chronic Pain Unstable or Poor Therapeutic Relationship Previous Psychiatric Diagnoses and Treatments Medical Diagnoses and Treatments/Surgeries   Musculoskeletal: Strength & Muscle Tone: decreased Gait & Station: unsteady Patient leans: Right  Psychiatric Specialty Exam:  Presentation  General Appearance: Disheveled  Eye Contact:Fair  Speech:Clear and Coherent  Speech Volume:Normal  Handedness:Right   Mood and Affect  Mood:Depressed  Affect:Congruent   Thought Process  Thought Processes:Goal Directed  Descriptions of Associations:Intact  Orientation:Full (Time, Place and Person)  Thought Content:Logical  History of Schizophrenia/Schizoaffective disorder:No data recorded Duration of Psychotic Symptoms:No data recorded Hallucinations:Hallucinations: None  Ideas of Reference:None  Suicidal Thoughts:Suicidal Thoughts: Yes, Passive  Homicidal Thoughts:Homicidal Thoughts: No   Sensorium  Memory:Immediate Fair; Recent Fair; Remote Fair  Judgment:Intact  Insight:Present   Executive Functions  Concentration:Fair  Attention Span:Fair  Recall:Fair  Fund of Knowledge:Fair  Language:Fair   Psychomotor Activity  Psychomotor Activity:Psychomotor Activity: Decreased   Assets  Assets:Communication Skills; Desire for Improvement; Financial Resources/Insurance; Resilience   Sleep  Sleep:Sleep: Fair    Physical Exam: Physical Exam ROS Blood pressure 132/63, pulse 62, temperature 97.7 F (36.5 C), temperature source Oral, resp. rate 18, height 5\' 8"  (1.727 m), weight 77.4 kg, SpO2 99 %. Body mass index is 25.95 kg/m.   COGNITIVE FEATURES THAT CONTRIBUTE TO RISK:  Loss of executive function and Polarized thinking     SUICIDE RISK:   Moderate:  Frequent suicidal ideation with limited intensity, and duration, some specificity in terms of plans, no associated intent, good self-control, limited dysphoria/symptomatology, some risk factors present, and identifiable protective factors, including available and accessible social support.  PLAN OF CARE: Continue inpatient admission, see  H&P for details.   I certify that inpatient services furnished can reasonably be expected to improve the patient's condition.   Jesse Sans, MD 01/31/2021, 9:27 AM

## 2021-01-31 NOTE — Tx Team (Signed)
Initial Treatment Plan 01/31/2021 1:15 AM [name] SFK:[id number]    PATIENT STRESSORS: Substance abuse     PATIENT STRENGTHS: Ability for insight  Capable of independent living  Communication skills  Motivation for treatment/growth  Supportive family/friends    PATIENT IDENTIFIED PROBLEMS: Depression  Anxiety  Suicidal Ideation                 DISCHARGE CRITERIA:  Improved stabilization in mood, thinking, and/or behavior  PRELIMINARY DISCHARGE PLAN: Return to previous living arrangement  PATIENT/FAMILY INVOLVEMENT: This treatment plan has been presented to and reviewed with the patient, Chad [name], and/or family member.  The patient and family have been given the opportunity to ask questions and make suggestions.  Criss Rosales, RN 01/31/2021, 1:15 AM

## 2021-01-31 NOTE — Progress Notes (Cosign Needed)
Patient arrived to the unit with an unsteady gait. Pt denies history of falls in the last 6 months. Pt was given front wheel walker. Pt admitted to using front wheel walker and cane at home due to chronic Left hip and lower back pain. Pt rated pain 8/10. Pt admits to drinking frequently. His last alcoholic drink was 3 days ago but is not experiencing withdrawal symptoms. Pt admits to smoking 3/4 of a pack of cigarettes a day. Pt admits he is depressed rating 5/10 and anxious 6/10. Pt denies HI and AVH at this time. Pt endorses passive SI and verbally contracts to safety. Pt is pleasant, calm and cooperative and shows motivation to get better. Pt is oriented to unit and unit rules. Pt remains safe on the unit at this time.

## 2021-02-01 LAB — COMPREHENSIVE METABOLIC PANEL
ALT: 35 U/L (ref 0–44)
AST: 33 U/L (ref 15–41)
Albumin: 3.7 g/dL (ref 3.5–5.0)
Alkaline Phosphatase: 78 U/L (ref 38–126)
Anion gap: 11 (ref 5–15)
BUN: 16 mg/dL (ref 8–23)
CO2: 29 mmol/L (ref 22–32)
Calcium: 9.6 mg/dL (ref 8.9–10.3)
Chloride: 96 mmol/L — ABNORMAL LOW (ref 98–111)
Creatinine, Ser: 0.69 mg/dL (ref 0.61–1.24)
GFR, Estimated: >60 mL/min (ref >60)
Glucose, Bld: 108 mg/dL — ABNORMAL HIGH (ref 70–99)
Potassium: 4.2 mmol/L (ref 3.5–5.1)
Sodium: 136 mmol/L (ref 135–145)
Total Bilirubin: 0.5 mg/dL (ref 0.3–1.2)
Total Protein: 6.7 g/dL (ref 6.5–8.1)

## 2021-02-01 MED ORDER — ACAMPROSATE CALCIUM 333 MG PO TBEC
333.0000 mg | DELAYED_RELEASE_TABLET | Freq: Three times a day (TID) | ORAL | Status: DC
  Administered 2021-02-01 – 2021-02-03 (×6): 333 mg via ORAL
  Filled 2021-02-01 (×11): qty 1

## 2021-02-01 MED ORDER — CITALOPRAM HYDROBROMIDE 20 MG PO TABS
20.0000 mg | ORAL_TABLET | Freq: Every day | ORAL | Status: DC
  Administered 2021-02-02 – 2021-02-27 (×26): 20 mg via ORAL
  Filled 2021-02-01 (×26): qty 1

## 2021-02-01 MED ORDER — LIDOCAINE 5 % EX PTCH
1.0000 | MEDICATED_PATCH | CUTANEOUS | Status: DC
  Administered 2021-02-01 – 2021-02-13 (×7): 1 via TRANSDERMAL
  Filled 2021-02-01 (×28): qty 1

## 2021-02-01 NOTE — Progress Notes (Signed)
   02/01/21 1640  Clinical Encounter Type  Visited With Patient  Visit Type Follow-up;Spiritual support;Social support  Referral From Nurse  Consult/Referral To Chaplain  Spiritual Encounters  Spiritual Needs Emotional;Other (Comment) (social support)  Chaplain Burris returned to visit with Pt. Chaplain provided supportive, intentional presence. Chaplain offered some suggestions for using imagination to find goals to hope for as well as techniques to stay grounded and also to show self-love and comfort. Chaplain encouraged Pt to notice how his body feels and to try some gentle movement to stretch and connect with self and also to go outside at any opportunity. Luna Fuse will return on Monday, 1017, so will check on Pt then if still in house.

## 2021-02-01 NOTE — Progress Notes (Signed)
Recreation Therapy Notes  INPATIENT RECREATION THERAPY ASSESSMENT  Patient Details Name: Chad [name] MRN: [id number] DOB: 02/04/1951 Today's Date: 02/01/2021       Information Obtained From: Patient  Able to Participate in Assessment/Interview: Yes  Patient Presentation: Responsive  Reason for Admission (Per Patient): Active Symptoms  Patient Stressors:    Coping Skills:   Substance Abuse  Leisure Interests (2+):   (Sleep)  Frequency of Recreation/Participation: Weekly  Awareness of Community Resources:  No  Community Resources:     Current Use:    If no, Barriers?:    Expressed Interest in [employer] Information: No  [place]idence[place]  Patient Main Form of Transportation: Therapist, music  Patient Strengths:  None  Patient Identified Areas of Improvement:  Not sure  Patient Goal for Hospitalization:  Feel less depressed  Current SI (including self-harm):  Yes (No plan)  Current HI:  No  Current AVH: No  Staff Intervention Plan: Group Attendance, Collaborate with Interdisciplinary Treatment Team  Consent to Intern Participation: N/A  Sivan Quast 02/01/2021, 1:24 PM

## 2021-02-01 NOTE — BH IP Treatment Plan (Signed)
Interdisciplinary Treatment and Diagnostic Plan Update  02/01/2021 Time of Session: 0900 Milbert Bixler MRN: [id number]  Principal Diagnosis: HTN (hypertension)  Secondary Diagnoses: Principal Problem:   HTN (hypertension) Active Problems:   Alcohol abuse   Weakness   Hyponatremia   Severe recurrent major depression without psychotic features (HCC)   Current Medications:  Current Facility-Administered Medications  Medication Dose Route Frequency Provider Last Rate Last Admin   alum & mag hydroxide-simeth (MAALOX/MYLANTA) 200-200-20 MG/5ML suspension 30 mL  30 mL Oral Q4H PRN Clapacs, Madie Reno, MD       citalopram (CELEXA) tablet 10 mg  10 mg Oral Daily Clapacs, John T, MD   10 mg at 02/01/21 0813   feeding supplement (ENSURE ENLIVE / ENSURE PLUS) liquid 237 mL  237 mL Oral TID BM Clapacs, John T, MD   237 mL at 71/69/67 8938   folic acid (FOLVITE) tablet 1 mg  1 mg Oral Daily Clapacs, John T, MD   1 mg at 02/01/21 0813   gabapentin (NEURONTIN) capsule 300 mg  300 mg Oral TID Clapacs, Madie Reno, MD   300 mg at 02/01/21 0813   HYDROcodone-acetaminophen (NORCO/VICODIN) 5-325 MG per tablet 1-2 tablet  1-2 tablet Oral Q6H PRN Salley Scarlet, MD   2 tablet at 02/01/21 1017   hydrOXYzine (ATARAX/VISTARIL) tablet 25 mg  25 mg Oral TID PRN Clapacs, Madie Reno, MD   25 mg at 01/31/21 2120   magnesium hydroxide (MILK OF MAGNESIA) suspension 30 mL  30 mL Oral Daily PRN Clapacs, Madie Reno, MD       multivitamin with minerals tablet 1 tablet  1 tablet Oral Daily Clapacs, Madie Reno, MD   1 tablet at 02/01/21 0813   ondansetron (ZOFRAN) tablet 8 mg  8 mg Oral Q8H PRN Salley Scarlet, MD       pantoprazole (PROTONIX) EC tablet 40 mg  40 mg Oral Daily Clapacs, Madie Reno, MD   40 mg at 02/01/21 0813   thiamine tablet 100 mg  100 mg Oral Daily Salley Scarlet, MD   100 mg at 02/01/21 0813   traZODone (DESYREL) tablet 50 mg  50 mg Oral QHS PRN Clapacs, Madie Reno, MD   50 mg at 01/31/21 2120   PTA  Medications: Medications Prior to Admission  Medication Sig Dispense Refill Last Dose   acetaminophen (TYLENOL) 325 MG tablet Take 2 tablets (650 mg total) by mouth every 6 (six) hours as needed for mild pain (pain score 1-3 or temp > 100.5).      citalopram (CELEXA) 10 MG tablet Take 1 tablet (10 mg total) by mouth daily.      feeding supplement (ENSURE ENLIVE / ENSURE PLUS) LIQD Take 237 mLs by mouth 3 (three) times daily between meals. 510 mL 12    folic acid (FOLVITE) 1 MG tablet Take 1 tablet (1 mg total) by mouth daily. (Patient not taking: No sig reported) 30 tablet 0    gabapentin (NEURONTIN) 300 MG capsule Take 1 capsule (300 mg total) by mouth 3 (three) times daily. 90 capsule 0    HYDROcodone-acetaminophen (NORCO/VICODIN) 5-325 MG tablet Take 1-2 tablets by mouth every 6 (six) hours as needed for moderate pain or severe pain. 8 tablet 0    hydrOXYzine (ATARAX/VISTARIL) 25 MG tablet Take 1 tablet (25 mg total) by mouth 3 (three) times daily as needed for anxiety. 30 tablet 0    Multiple Vitamin (MULTIVITAMIN WITH MINERALS) TABS tablet Take 1 tablet by mouth daily. (Patient  not taking: No sig reported) 30 tablet 0    pantoprazole (PROTONIX) 40 MG tablet Take 1 tablet (40 mg total) by mouth daily. 30 tablet 0    traZODone (DESYREL) 150 MG tablet Take 1 tablet (150 mg total) by mouth at bedtime. 30 tablet 0     Patient Stressors: Substance abuse    Patient Strengths: Ability for insight  Capable of independent living  Communication skills  Motivation for treatment/growth  Supportive family/friends   Treatment Modalities: Medication Management, Group therapy, Case management,  1 to 1 session with clinician, Psychoeducation, Recreational therapy.   Physician Treatment Plan for Primary Diagnosis: HTN (hypertension) Long Term Goal(s): Improvement in symptoms so as ready for discharge   Short Term Goals: Ability to identify changes in lifestyle to reduce recurrence of condition will  improve Ability to verbalize feelings will improve Ability to disclose and discuss suicidal ideas Ability to demonstrate self-control will improve Ability to identify and develop effective coping behaviors will improve Ability to maintain clinical measurements within normal limits will improve Compliance with prescribed medications will improve Ability to identify triggers associated with substance abuse/mental health issues will improve  Medication Management: Evaluate patient's response, side effects, and tolerance of medication regimen.  Therapeutic Interventions: 1 to 1 sessions, Unit Group sessions and Medication administration.  Evaluation of Outcomes: Not Met  Physician Treatment Plan for Secondary Diagnosis: Principal Problem:   HTN (hypertension) Active Problems:   Alcohol abuse   Weakness   Hyponatremia   Severe recurrent major depression without psychotic features ([place])  Long Term Goal(s): Improvement in symptoms so as ready for discharge   Short Term Goals: Ability to identify changes in lifestyle to reduce recurrence of condition will improve Ability to verbalize feelings will improve Ability to disclose and discuss suicidal ideas Ability to demonstrate self-control will improve Ability to identify and develop effective coping behaviors will improve Ability to maintain clinical measurements within normal limits will improve Compliance with prescribed medications will improve Ability to identify triggers associated with substance abuse/mental health issues will improve     Medication Management: Evaluate patient's response, side effects, and tolerance of medication regimen.  Therapeutic Interventions: 1 to 1 sessions, Unit Group sessions and Medication administration.  Evaluation of Outcomes: Not Met   RN Treatment Plan for Primary Diagnosis: HTN (hypertension) Long Term Goal(s): Knowledge of disease and therapeutic regimen to maintain health will  improve  Short Term Goals: Ability to remain free from injury will improve, Ability to verbalize frustration and anger appropriately will improve, Ability to demonstrate self-control, Ability to participate in decision making will improve, Ability to verbalize feelings will improve, Ability to disclose and discuss suicidal ideas, Ability to identify and develop effective coping behaviors will improve, and Compliance with prescribed medications will improve  Medication Management: RN will administer medications as ordered by provider, will assess and evaluate patient's response and provide education to patient for prescribed medication. RN will report any adverse and/or side effects to prescribing provider.  Therapeutic Interventions: 1 on 1 counseling sessions, Psychoeducation, Medication administration, Evaluate responses to treatment, Monitor vital signs and CBGs as ordered, Perform/monitor CIWA, COWS, AIMS and Fall Risk screenings as ordered, Perform wound care treatments as ordered.  Evaluation of Outcomes: Not Met   LCSW Treatment Plan for Primary Diagnosis: HTN (hypertension) Long Term Goal(s): Safe transition to appropriate next level of care at discharge, Engage patient in therapeutic group addressing interpersonal concerns.  Short Term Goals: Engage patient in aftercare planning with referrals and resources, Increase social  support, Increase ability to appropriately verbalize feelings, Increase emotional regulation, Facilitate acceptance of mental health diagnosis and concerns, Facilitate patient progression through stages of change regarding substance use diagnoses and concerns, Identify triggers associated with mental health/substance abuse issues, and Increase skills for wellness and recovery  Therapeutic Interventions: Assess for all discharge needs, 1 to 1 time with Social worker, Explore available resources and support systems, Assess for adequacy in community support network, Educate  family and significant other(s) on suicide prevention, Complete Psychosocial Assessment, Interpersonal group therapy.  Evaluation of Outcomes: Not Met   Progress in Treatment: Attending groups: No. Participating in groups: No. Taking medication as prescribed: Yes. Toleration medication: Yes. Family/Significant other contact made: No, will contact:  Patient declined for CSW to reach collateral.  Patient understands diagnosis: No. Discussing patient identified problems/goals with staff: Yes. Medical problems stabilized or resolved: Yes. Denies suicidal/homicidal ideation: No. Issues/concerns per patient self-inventory: Yes. Other: none   New problem(s) identified: No, Describe:  "Need a new pair of glasses"   New Short Term/Long Term Goal(s): Patient to work toward elimination of symptoms of psychosis, medication management for mood stabilization; elimination of SI thoughts; development of comprehensive mental wellness plan.  Patient Goals: "Have not put a lot of thought into it:"   Discharge Plan or Barriers: No barriers identified at this time.   Reason for Continuation of Hospitalization: Depression  Estimated Length of Stay: 1-7 days   Scribe for Treatment Team: Marshal, Eskew 02/01/2021 9:19 AM

## 2021-02-01 NOTE — Progress Notes (Signed)
Recreation Therapy Notes  Date: 02/01/2021  Time: 10:00 am   Location: Craft room    Behavioral response: N/A   Intervention Topic: Time Management   Discussion/Intervention: Patient did not attend group.   Clinical Observations/Feedback:  Patient did not attend group.   Nicholis Stepanek LRT/CTRS        Sarah Zerby 02/01/2021 11:30 AM 
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
## 2021-02-01 NOTE — Progress Notes (Signed)
Ambulatory with walker, requesting ensure. Provided vanilla ensure in dining room and snack.

## 2021-02-01 NOTE — Progress Notes (Signed)
Recreation Therapy Notes  INPATIENT RECREATION TR PLAN  Patient Details Name: Chad [name] MRN: [id number] DOB: 1950/05/24 Today's Date: 02/01/2021  Rec Therapy Plan Is patient appropriate for Therapeutic Recreation?: Yes Treatment times per week: at least 3 Estimated Length of Stay: 5-7 days TR Treatment/Interventions: Group participation (Comment)  Discharge Criteria Pt will be discharged from therapy if:: Discharged Treatment plan/goals/alternatives discussed and agreed upon by:: Patient/family  Discharge Summary     Laurence Crofford 02/01/2021, 1:31 PM

## 2021-02-01 NOTE — Progress Notes (Signed)
Awake, alert in bed when approached. No complaints voiced at this time; he has a flat affect. Denies SI/HI/AVH at present time. Safety measures maintained. Remains safe on the unit with q15 min safety checks.

## 2021-02-01 NOTE — Care Management Important Message (Signed)
Important Message  Patient Details  Name: Chad [name] MRN: [id number] Date of Birth: 02-Mar-1951   Medicare Important Message Given:  N/A - LOS <3 / Initial given by admissions     [name] 02/01/2021, 10:04 AM

## 2021-02-01 NOTE — Progress Notes (Signed)
Patient appears depressed and sad. Patient states " I lost all my hope. I am worried about present  and future." Verbalized passive SI at times. Contracts for safety. Denies HI and AVH.Patient stays in bed except for meals. PRN pain medications helps with left hip pain. Ambulates with walker. Appetite good. Patient agreed for [facility] services.Prompted for shower. Support and encouragement given.

## 2021-02-01 NOTE — Progress Notes (Addendum)
D: Patient appears as restless. Pt complained of anxiety 8/10 and pain in left hip 8/10. Pt denies HI and AVH at this time. Pt endorses passives SI w/o plan or intention and verbally agrees to contract for safety.   A: Administered medications as per MAR orders. Provided emotional support. Encouraged pt to come to staff with any concerns.  R: Pt is calm and cooperative. Pt remains safe on the unit at this time.    01/31/21 2200  Psych Admission Type (Psych Patients Only)  Admission Status Voluntary  Psychosocial Assessment  Patient Complaints Anxiety;Appetite decrease;Sleep disturbance  Eye Contact Brief  Facial Expression Sullen  Affect Anxious;Depressed;Sullen  Speech Soft  Interaction Assertive  Motor Activity Slow  Appearance/Hygiene Unremarkable  Behavior Characteristics Cooperative;Appropriate to situation  Mood Anxious;Depressed;Pleasant  Aggressive Behavior  Effect No apparent injury  Thought Process  Coherency WDL  Content WDL  Delusions None reported or observed  Perception WDL  Hallucination None reported or observed  Judgment Limited  Confusion None  Danger to Self  Current suicidal ideation? Denies  Danger to Others  Danger to Others None reported or observed

## 2021-02-01 NOTE — Group Note (Signed)

## 2021-02-01 NOTE — Progress Notes (Addendum)
   02/01/21 1110  Clinical Encounter Type  Visited With Patient  Visit Type Initial  Referral From Nurse  Consult/Referral To Chaplain  Spiritual Encounters  Spiritual Needs  (not assessed)  Chaplain Burris followed up on a referral to visit Pt. Chaplain introduced herself and made herself available for support. Pt indicated interest in talking but asked if we could talk later so that he could rest. Chaplain offered that she could return in the afternoon; will follow up later on 10/14. Chaplain also informed Pt that someone would be available after hours as well and to simply inform his nurse and that pastoral care would be available at any time.

## 2021-02-02 DIAGNOSIS — F1010 Alcohol abuse, uncomplicated: Secondary | ICD-10-CM
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
## 2021-02-02 NOTE — Group Note (Signed)
LCSW Group Therapy Note  Group Date: 02/02/2021 Start Time: 1300 End Time: 1400   Type of Therapy and Topic:  Group Therapy - Healthy vs Unhealthy Coping Skills  Participation Level:  Did Not Attend   Description of Group The focus of this group was to determine what unhealthy coping techniques typically are used by group members and what healthy coping techniques would be helpful in coping with various problems. Patients were guided in becoming aware of the differences between healthy and unhealthy coping techniques. Patients were asked to identify 2-3 healthy coping skills they would like to learn to use more effectively.  Therapeutic Goals Patients learned that coping is what human beings do all day long to deal with various situations in their lives Patients defined and discussed healthy vs unhealthy coping techniques Patients identified their preferred coping techniques and identified whether these were healthy or unhealthy Patients determined 2-3 healthy coping skills they would like to become more familiar with and use more often. Patients provided support and ideas to each other   Summary of Patient Progress:  Patient did not attend group despite encouraged participation.   Therapeutic Modalities Cognitive Behavioral Therapy Motivational Interviewing  Norberto Sorenson, Theresia Majors 02/02/2021  3:50 PM

## 2021-02-02 NOTE — Progress Notes (Signed)
Patient complained of pain in left hip early in shift and received vicodin per prn order. He has been in the dayroom, watching TV with peers. Denies SI, HI and AVH

## 2021-02-02 NOTE — Plan of Care (Signed)
Pt rates depression, anxiety, hopelessness all at 10/10. Pt denies SI, HI and AV. Pt was educated on care plan and verbalizes understanding. Torrie Mayers RN Problem: Education: Goal: Knowledge of [facility] Education information/materials will improve Outcome: Progressing Goal: Emotional status will improve Outcome: Progressing Goal: Mental status will improve Outcome: Progressing Goal: Verbalization of understanding the information provided will improve Outcome: Progressing   Problem: Activity: Goal: Interest or engagement in activities will improve Outcome: Progressing Goal: Sleeping patterns will improve Outcome: Progressing   Problem: Coping: Goal: Ability to verbalize frustrations and anger appropriately will improve Outcome: Progressing Goal: Ability to demonstrate self-control will improve Outcome: Progressing   Problem: Health Behavior/Discharge Planning: Goal: Identification of resources available to assist in meeting health care needs will improve Outcome: Progressing Goal: Compliance with treatment plan for underlying cause of condition will improve Outcome: Progressing   Problem: Physical Regulation: Goal: Ability to maintain clinical measurements within normal limits will improve Outcome: Progressing   Problem: Safety: Goal: Periods of time without injury will increase Outcome: Progressing   Problem: Education: Goal: Utilization of techniques to improve thought processes will improve Outcome: Progressing Goal: Knowledge of the prescribed therapeutic regimen will improve Outcome: Progressing   Problem: Activity: Goal: Interest or engagement in leisure activities will improve Outcome: Progressing Goal: Imbalance in normal sleep/wake cycle will improve Outcome: Progressing   Problem: Coping: Goal: Coping ability will improve Outcome: Progressing Goal: Will verbalize feelings Outcome: Progressing   Problem: Health Behavior/Discharge  Planning: Goal: Ability to make decisions will improve Outcome: Progressing Goal: Compliance with therapeutic regimen will improve Outcome: Progressing   Problem: Role Relationship: Goal: Will demonstrate positive changes in social behaviors and relationships Outcome: Progressing   Problem: Safety: Goal: Ability to disclose and discuss suicidal ideas will improve Outcome: Progressing Goal: Ability to identify and utilize support systems that promote safety will improve Outcome: Progressing   Problem: Self-Concept: Goal: Will verbalize positive feelings about self Outcome: Progressing Goal: Level of anxiety will decrease Outcome: Progressing   Problem: Education: Goal: Ability to state activities that reduce stress will improve Outcome: Progressing   Problem: Coping: Goal: Ability to identify and develop effective coping behavior will improve Outcome: Progressing   Problem: Self-Concept: Goal: Ability to identify factors that promote anxiety will improve Outcome: Progressing Goal: Level of anxiety will decrease Outcome: Progressing Goal: Ability to modify response to factors that promote anxiety will improve Outcome: Progressing

## 2021-02-02 NOTE — Plan of Care (Signed)
  Problem: Education: Goal: Knowledge of [facility] Education information/materials will improve Outcome: Progressing Goal: Emotional status will improve Outcome: Progressing Goal: Mental status will improve Outcome: Progressing Goal: Verbalization of understanding the information provided will improve Outcome: Progressing   Problem: Activity: Goal: Interest or engagement in activities will improve Outcome: Progressing Goal: Sleeping patterns will improve Outcome: Progressing   Problem: Coping: Goal: Ability to verbalize frustrations and anger appropriately will improve Outcome: Progressing Goal: Ability to demonstrate self-control will improve Outcome: Progressing   Problem: Health Behavior/Discharge Planning: Goal: Identification of resources available to assist in meeting health care needs will improve Outcome: Progressing Goal: Compliance with treatment plan for underlying cause of condition will improve Outcome: Progressing   Problem: Physical Regulation: Goal: Ability to maintain clinical measurements within normal limits will improve Outcome: Progressing   Problem: Safety: Goal: Periods of time without injury will increase Outcome: Progressing   Problem: Education: Goal: Utilization of techniques to improve thought processes will improve Outcome: Progressing Goal: Knowledge of the prescribed therapeutic regimen will improve Outcome: Progressing   Problem: Activity: Goal: Interest or engagement in leisure activities will improve Outcome: Progressing Goal: Imbalance in normal sleep/wake cycle will improve Outcome: Progressing   Problem: Coping: Goal: Coping ability will improve Outcome: Progressing Goal: Will verbalize feelings Outcome: Progressing   Problem: Health Behavior/Discharge Planning: Goal: Ability to make decisions will improve Outcome: Progressing Goal: Compliance with therapeutic regimen will improve Outcome: Progressing    Problem: Role Relationship: Goal: Will demonstrate positive changes in social behaviors and relationships Outcome: Progressing   Problem: Safety: Goal: Ability to disclose and discuss suicidal ideas will improve Outcome: Progressing Goal: Ability to identify and utilize support systems that promote safety will improve Outcome: Progressing   Problem: Self-Concept: Goal: Will verbalize positive feelings about self Outcome: Progressing Goal: Level of anxiety will decrease Outcome: Progressing   Problem: Education: Goal: Ability to state activities that reduce stress will improve Outcome: Progressing   Problem: Coping: Goal: Ability to identify and develop effective coping behavior will improve Outcome: Progressing   Problem: Self-Concept: Goal: Ability to identify factors that promote anxiety will improve Outcome: Progressing Goal: Level of anxiety will decrease Outcome: Progressing Goal: Ability to modify response to factors that promote anxiety will improve Outcome: Progressing   

## 2021-02-02 NOTE — Progress Notes (Signed)
Pt has been med compliant, calm and cooperative. Pt has been mainly ant social and withdrawn resting. Pt has a worried affect. Ot received two PRNs today. Torrie Mayers RN

## 2021-02-03 NOTE — Progress Notes (Signed)
D: Pt alert and oriented. Pt rates depression 6/10, hopelessness 6/10, and anxiety 6/10. Pt goal: "To improve my depression." Pt reports energy level as normal and concentration as being good. Pt reports sleep last night as being fair. Pt did receive medications for sleep and did find them helpful. Pt reports experiencing 8/10 lefte hip pain, prn meds given. Pt denies experiencing any SI/HI, or AVH at this time.   A: Scheduled medications administered to pt, per MD orders. Support and encouragement provided. Frequent verbal contact made. Routine safety checks conducted q15 minutes.   R: No adverse drug reactions noted. Pt verbally contracts for safety at this time. Pt complaint with medications and treatment plan. Pt interacts minimally with others on the unit, however can be observed in the milieu. Pt remains safe at this time. Will continue to monitor.
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
## 2021-02-03 NOTE — Group Note (Signed)
[facility] LCSW Group Therapy Note   Group Date: 02/03/2021 Start Time: 1310 End Time: 1410   Type of Therapy and Topic: Group Therapy: Avoiding Self-Sabotaging and Enabling Behaviors  Participation Level: Minimal  Mood: Depressed  Description of Group:  In this group, patients will learn how to identify obstacles, self-sabotaging and enabling behaviors, as well as: what are they, why do we do them and what needs these behaviors meet. Discuss unhealthy relationships and how to have positive healthy boundaries with those that sabotage and enable. Explore aspects of self-sabotage and enabling in yourself and how to limit these self-destructive behaviors in everyday life.   Therapeutic Goals: 1. Patient will identify one obstacle that relates to self-sabotage and enabling behaviors 2. Patient will identify one personal self-sabotaging or enabling behavior they did prior to admission 3. Patient will state a plan to change the above identified behavior 4. Patient will demonstrate ability to communicate their needs through discussion and/or role play.    Summary of Patient Progress: Patient presented a few minutes after group started. Patient presented as guarded but appeared to be actively listening to other group members share. When asked about self sabotaging behaviors, patient initially was not able to identify any. However, patient later shared about how he tends to say things that get him in trouble. Patient shared about how he wants to improve his relationship with his daughter and states he plans to engage in more effective communication with her by trying to see things from her perspective.     Therapeutic Modalities:  Cognitive Behavioral Therapy Person-Centered Therapy Motivational Interviewing    Ileana Ladd [place], LCSWA

## 2021-02-04 DIAGNOSIS — I10 Essential (primary) hypertension: Secondary | ICD-10-CM | POA: Diagnosis not present

## 2021-02-04 DIAGNOSIS — F332 Major depressive disorder, recurrent severe without psychotic features: Secondary | ICD-10-CM | POA: Diagnosis not present

## 2021-02-04 LAB — SODIUM: Sodium: 134 mmol/L — ABNORMAL LOW (ref 135–145)

## 2021-02-04 MED ORDER — ACAMPROSATE CALCIUM 333 MG PO TBEC
666.0000 mg | DELAYED_RELEASE_TABLET | Freq: Three times a day (TID) | ORAL | Status: DC
  Administered 2021-02-04 – 2021-02-27 (×65): 666 mg via ORAL
  Filled 2021-02-04 (×72): qty 2

## 2021-02-04 NOTE — Progress Notes (Signed)
   02/04/21 1425  Clinical Encounter Type  Visited With Patient  Visit Type Follow-up;Spiritual support;Social support  Spiritual Encounters  Spiritual Needs Other (Comment) (social support)  Chaplain Burris folowed up with Pt last seen Friday, 10/14. Today Pt seemed to be more eager to engage and upbeat. We talked again about the [facility] and the idea of having that as a goal or something similar to look forward to. Chaplain accompanied Pt with others to sit outside. Will continue to follow.

## 2021-02-04 NOTE — Progress Notes (Signed)
Pt is still the same as this morning. Pt is still somewhat irritable. Pt has received PRNs. Pt has a sullen affect. Pt has a good appetite and attended group. Torrie Mayers RN

## 2021-02-04 NOTE — Progress Notes (Signed)
Recreation Therapy Notes    Date: 02/04/2021  Time: 9:45 am   Location: Craft room   Behavioral response: Appropriate  Intervention Topic: Self-care   Discussion/Intervention:  Group content today was focused on Self-Care. The group defined self-care and some positive ways they care for themselves. Individuals expressed ways and reasons why they neglected any self-care in the past. Patients described ways to improve self-care in the future. The group explained what could happen if they did not do any self-care activities at all. The group participated in the intervention "self-care assessment" where they had a chance to discover some of their weaknesses and strengths in self- care. Patient came up with a self-care plan to improve themselves in the future.  Clinical Observations/Feedback: Patient came to group and was focused on what peers and staff had to say about self-care. Individual was social with peers and staff while participating in the intervention.  Clance Baquero LRT/CTRS         Jamine Wingate 02/04/2021 11:45 AM

## 2021-02-04 NOTE — [facility] Counselor (Signed)
CSW and MSW interns met with the patient to discuss aftercare plans.  Patient reports that at this time he will "think about it".  Assunta Curtis, MSW, LCSW 02/04/2021 2:14 PM

## 2021-02-04 NOTE — Progress Notes (Signed)
Patient has been isolative to his room, but did come out for snack and lunch. Denies si/hi/avh. Reports that his anxiety and depression are getting better.  He is med compliant and received his medication without incident. He was encouraged to come seek staff with any concerns. Will continue to monitor with Q 15 minute safety checks.   Cleo Butler-Nicholson, LPN

## 2021-02-04 NOTE — Plan of Care (Signed)
Pt denies depression, anxiety, SI, HI and AVH. Pt was educated on care plan and verbalizes understanding. Torrie Mayers RN Problem: Education: Goal: Knowledge of [facility] Education information/materials will improve Outcome: Progressing Goal: Emotional status will improve Outcome: Progressing Goal: Mental status will improve Outcome: Progressing Goal: Verbalization of understanding the information provided will improve Outcome: Progressing   Problem: Activity: Goal: Interest or engagement in activities will improve Outcome: Progressing Goal: Sleeping patterns will improve Outcome: Progressing   Problem: Coping: Goal: Ability to verbalize frustrations and anger appropriately will improve Outcome: Progressing Goal: Ability to demonstrate self-control will improve Outcome: Progressing   Problem: Health Behavior/Discharge Planning: Goal: Identification of resources available to assist in meeting health care needs will improve Outcome: Progressing Goal: Compliance with treatment plan for underlying cause of condition will improve Outcome: Progressing   Problem: Physical Regulation: Goal: Ability to maintain clinical measurements within normal limits will improve Outcome: Progressing   Problem: Safety: Goal: Periods of time without injury will increase Outcome: Progressing   Problem: Education: Goal: Utilization of techniques to improve thought processes will improve Outcome: Progressing Goal: Knowledge of the prescribed therapeutic regimen will improve Outcome: Progressing   Problem: Activity: Goal: Interest or engagement in leisure activities will improve Outcome: Progressing Goal: Imbalance in normal sleep/wake cycle will improve Outcome: Progressing   Problem: Coping: Goal: Coping ability will improve Outcome: Progressing Goal: Will verbalize feelings Outcome: Progressing   Problem: Health Behavior/Discharge Planning: Goal: Ability to make decisions  will improve Outcome: Progressing Goal: Compliance with therapeutic regimen will improve Outcome: Progressing   Problem: Role Relationship: Goal: Will demonstrate positive changes in social behaviors and relationships Outcome: Progressing   Problem: Safety: Goal: Ability to disclose and discuss suicidal ideas will improve Outcome: Progressing Goal: Ability to identify and utilize support systems that promote safety will improve Outcome: Progressing   Problem: Self-Concept: Goal: Will verbalize positive feelings about self Outcome: Progressing Goal: Level of anxiety will decrease Outcome: Progressing   Problem: Education: Goal: Ability to state activities that reduce stress will improve Outcome: Progressing   Problem: Coping: Goal: Ability to identify and develop effective coping behavior will improve Outcome: Progressing   Problem: Self-Concept: Goal: Ability to identify factors that promote anxiety will improve Outcome: Progressing Goal: Level of anxiety will decrease Outcome: Progressing Goal: Ability to modify response to factors that promote anxiety will improve Outcome: Progressing

## 2021-02-04 NOTE — [facility] Group Notes (Signed)
[facility] Group Notes:  (Nursing/MHT/Case Management/Adjunct)  Date:  02/04/2021  Time:  9:43 AM  Type of Therapy:  Community Meeting  Participation Level:  Active  Participation Quality:  Appropriate, Attentive, and Sharing  Affect:  Appropriate  Cognitive:  Alert and Appropriate  Insight:  Appropriate  Engagement in Group:  Engaged  Modes of Intervention:  Education and Orientation  Summary of Progress/Problems:  Lynelle [place] Matthis 02/04/2021, 9:43 AM

## 2021-02-04 NOTE — Progress Notes (Signed)
Patient spent most of the evening in the dayroom watching the football game with his peers. He has been requesting his pain medication every 6 hours regularly, pain level stays between 6 and 8. Denies SI, HI and AVH. Awakened early this morning, sitting in day room
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
## 2021-02-04 NOTE — Plan of Care (Signed)
  Problem: Education: Goal: Knowledge of [facility] Education information/materials will improve Outcome: Progressing Goal: Emotional status will improve Outcome: Progressing Goal: Mental status will improve Outcome: Progressing Goal: Verbalization of understanding the information provided will improve Outcome: Progressing   Problem: Activity: Goal: Interest or engagement in activities will improve Outcome: Progressing Goal: Sleeping patterns will improve Outcome: Progressing   Problem: Coping: Goal: Ability to verbalize frustrations and anger appropriately will improve Outcome: Progressing Goal: Ability to demonstrate self-control will improve Outcome: Progressing   Problem: Health Behavior/Discharge Planning: Goal: Identification of resources available to assist in meeting health care needs will improve Outcome: Progressing Goal: Compliance with treatment plan for underlying cause of condition will improve Outcome: Progressing   Problem: Physical Regulation: Goal: Ability to maintain clinical measurements within normal limits will improve Outcome: Progressing   Problem: Safety: Goal: Periods of time without injury will increase Outcome: Progressing   Problem: Education: Goal: Utilization of techniques to improve thought processes will improve Outcome: Progressing Goal: Knowledge of the prescribed therapeutic regimen will improve Outcome: Progressing   Problem: Activity: Goal: Interest or engagement in leisure activities will improve Outcome: Progressing Goal: Imbalance in normal sleep/wake cycle will improve Outcome: Progressing   Problem: Coping: Goal: Coping ability will improve Outcome: Progressing Goal: Will verbalize feelings Outcome: Progressing   Problem: Health Behavior/Discharge Planning: Goal: Ability to make decisions will improve Outcome: Progressing Goal: Compliance with therapeutic regimen will improve Outcome: Progressing    Problem: Role Relationship: Goal: Will demonstrate positive changes in social behaviors and relationships Outcome: Progressing   Problem: Safety: Goal: Ability to disclose and discuss suicidal ideas will improve Outcome: Progressing Goal: Ability to identify and utilize support systems that promote safety will improve Outcome: Progressing   Problem: Self-Concept: Goal: Will verbalize positive feelings about self Outcome: Progressing Goal: Level of anxiety will decrease Outcome: Progressing   Problem: Education: Goal: Ability to state activities that reduce stress will improve Outcome: Progressing   Problem: Coping: Goal: Ability to identify and develop effective coping behavior will improve Outcome: Progressing   Problem: Self-Concept: Goal: Ability to identify factors that promote anxiety will improve Outcome: Progressing Goal: Level of anxiety will decrease Outcome: Progressing Goal: Ability to modify response to factors that promote anxiety will improve Outcome: Progressing   

## 2021-02-04 NOTE — Plan of Care (Signed)
°  Problem: Group Participation °Goal: STG - Patient will engage in groups without prompting or encouragement from LRT x3 group sessions within 5 recreation therapy group sessions °Description: STG - Patient will engage in groups without prompting or encouragement from LRT x3 group sessions within 5 recreation therapy group sessions °Outcome: Progressing °  °

## 2021-02-05 ENCOUNTER — Encounter: Payer: Self-pay | Admitting: General Surgery

## 2021-02-05 DIAGNOSIS — I10 Essential (primary) hypertension: Secondary | ICD-10-CM | POA: Diagnosis not present

## 2021-02-05 DIAGNOSIS — F332 Major depressive disorder, recurrent severe without psychotic features: Secondary | ICD-10-CM | POA: Diagnosis not present

## 2021-02-05 NOTE — BHH Counselor (Addendum)
ADDENDUM  CSW received call from [facility] in [place], [place] who reports that they are accepting new patients and that the patient's insurance is in network. CSW to discuss PT option with patient, accordingly supporting documentation will be faxed to [phone].  Signed:  Corky Crafts, MSW, [place], LCASA 02/05/2021 1:06 PM  Per physical therapy recommendation on 1013/22, patient was referred out to outpatient PT to the following locations. CSW will continue monitor patient disposition.   [facility]  [facility] [place]  [facility]  [facility]  [facility] [place]  [facility] [place] [facility]     Situation ongoing, CSW will continue to monitor and update note as more information becomes available.    Signed:  Corky Crafts, MSW, [place], LCASA 02/05/2021 10:59 AM7

## 2021-02-05 NOTE — Plan of Care (Signed)
  Problem: Education: Goal: Emotional status will improve Outcome: Not Progressing Goal: Mental status will improve Outcome: Progressing Goal: Verbalization of understanding the information provided will improve Outcome: Progressing   Problem: Activity: Goal: Interest or engagement in activities will improve Outcome: Not Progressing   Problem: Coping: Goal: Ability to demonstrate self-control will improve Outcome: Progressing   Problem: Health Behavior/Discharge Planning: Goal: Compliance with treatment plan for underlying cause of condition will improve Outcome: Progressing

## 2021-02-05 NOTE — Plan of Care (Signed)
  Problem: Education: Goal: Knowledge of [place] [facility] Education information/materials will improve Outcome: Progressing Goal: Emotional status will improve Outcome: Progressing Goal: Mental status will improve Outcome: Progressing Goal: Verbalization of understanding the information provided will improve Outcome: Progressing   Problem: Activity: Goal: Interest or engagement in activities will improve Outcome: Progressing Goal: Sleeping patterns will improve Outcome: Progressing   Problem: Coping: Goal: Ability to verbalize frustrations and anger appropriately will improve Outcome: Progressing Goal: Ability to demonstrate self-control will improve Outcome: Progressing   

## 2021-02-05 NOTE — Progress Notes (Signed)
PT Cancellation Note  Patient Details Name: Chad [name] MRN: [id number] DOB: [birth year]   Cancelled Treatment:    Reason Eval/Treat Not Completed: Other (comment). Pt initially agreeable to PT session, but when it came time to move, declined any mobility attempts, education on hip stretches or exercises. Stated his pain was 8/10 in his L hip. PT to re-attempt as able.  Olga Coaster PT, DPT 2:39 PM,02/05/21

## 2021-02-05 NOTE — BHH Counselor (Addendum)
CSW met with patient at bedside to discuss outpatient PT referral to [facility].   When asked if he was agreable with referral, patient reports "I do not know." When asked about his hesitation, patient responded "I do not know . . . What is the hurry, it seems like you all are trying to get me out of here."   CSW assured patient that referrals are made in order to prevent delays once ready for discharge. Patient requested time to consider his option, CSW will follow up at a later time. CSW to send [facility] supporting documentation only when patient has consented to outpatient PT services.    Signed:  Durenda Hurt, MSW, [place], LCASA 02/05/2021 1:23 PM

## 2021-02-05 NOTE — Progress Notes (Signed)
Pt has been in his bed a lot since dinner, napping. Maintenance called because is commode is backed up. He got pain med for his  lt hip x 1  and he is trying not to ask for it too much.

## 2021-02-05 NOTE — Progress Notes (Signed)
Recreation Therapy Notes  Date: 02/05/2021  Time: 9:45 am   Location: Craft room   Behavioral response: Appropriate  Intervention Topic: Stress Management    Discussion/Intervention:  Group content on today was focused on stress. The group defined stress and way to cope with stress. Participants expressed how they know when they are stresses out. Individuals described the different ways they have to cope with stress. The group stated reasons why it is important to cope with stress. Patient explained what good stress is and some examples. The group participated in the intervention "Stress Management". Individuals were separated into two group and answered questions related to stress.  Clinical Observations/Feedback: Patient came to group and was focused on what peers and staff had to say about stress management. Individual was social with peers and staff while participating in the intervention.  Finas Delone LRT/CTRS         Ardena Gangl 02/05/2021 10:52 AM

## 2021-02-05 NOTE — BHH Group Notes (Signed)
BHH Group Notes:  (Nursing/MHT/Case Management/Adjunct)  Date:  02/05/2021  Time:  9:32 AM  Type of Therapy:  Community Meeting  Participation Level:  Active  Participation Quality:  Appropriate, Attentive, and Sharing  Affect:  Appropriate  Cognitive:  Alert and Appropriate  Insight:  Appropriate  Engagement in Group:  Engaged  Modes of Intervention:  Discussion, Education, and Orientation  Summary of Progress/Problems:  Lynelle Smoke Josiephine Simao 02/05/2021, 9:32 AM

## 2021-02-05 NOTE — Group Note (Signed)
BHH LCSW Group Therapy Note   Group Date: 02/05/2021 Start Time: 1300 End Time: 1400  Type of Therapy/Topic:  Group Therapy:  Feelings about Diagnosis  Participation Level:  Did Not Attend   Description of Group:    This group will allow patients to explore their thoughts and feelings about diagnoses they have received. Patients will be guided to explore their level of understanding and acceptance of these diagnoses. Facilitator will encourage patients to process their thoughts and feelings about the reactions of others to their diagnosis, and will guide patients in identifying ways to discuss their diagnosis with significant others in their lives. This group will be process-oriented, with patients participating in exploration of their own experiences as well as giving and receiving support and challenge from other group members.   Therapeutic Goals: 1. Patient will demonstrate understanding of diagnosis as evidence by identifying two or more symptoms of the disorder:  2. Patient will be able to express two feelings regarding the diagnosis 3. Patient will demonstrate ability to communicate their needs through discussion and/or role plays  Summary of Patient Progress: X  Therapeutic Modalities:   Cognitive Behavioral Therapy Brief Therapy Feelings Identification    Joshau Code J Yong Grieser, LCSW 

## 2021-02-05 NOTE — Group Note (Signed)
[facility] LCSW Group Therapy Note    Group Date: 02/04/2021 Start Time: 1300 End Time: 1400  Type of Therapy and Topic:  Group Therapy:  Overcoming Obstacles  Participation Level:  B[facility] PARTICIPATION LEVEL: Did Not Attend  Mood:  Description of Group:   In this group patients will be encouraged to explore what they see as obstacles to their own wellness and recovery. They will be guided to discuss their thoughts, feelings, and behaviors related to these obstacles. The group will process together ways to cope with barriers, with attention given to specific choices patients can make. Each patient will be challenged to identify changes they are motivated to make in order to overcome their obstacles. This group will be process-oriented, with patients participating in exploration of their own experiences as well as giving and receiving support and challenge from other group members.  Therapeutic Goals: 1. Patient will identify personal and current obstacles as they relate to admission. 2. Patient will identify barriers that currently interfere with their wellness or overcoming obstacles.  3. Patient will identify feelings, thought process and behaviors related to these barriers. 4. Patient will identify two changes they are willing to make to overcome these obstacles:    Summary of Patient Progress   CSW notes that group was not held 02/04/2021 due to acuity on unit.     Therapeutic Modalities:   Cognitive Behavioral Therapy Solution Focused Therapy Motivational Interviewing Relapse Prevention Therapy   Janiyla Long J Mileigh Tilley, LCSW 

## 2021-02-05 NOTE — Progress Notes (Signed)
Patient approached the nurses station reporting pain and wanting PRN pain medication. Was provided one pill, but patient felt that he needed two.  He rated his pain at 8/10. Will continue to monitor with q15 minute safety rounds.     Cleo Butler-Nicholson, LPN
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
## 2021-02-05 NOTE — Plan of Care (Signed)
Pt has been visible on the unit, minimal interaction with peers/staff, but pleasant on approach. He eats in the dayroom and attends groups. His chief compliant is about left hip pain/discomfort. He was not open about what brought him to the hospital, but compliant with med and treatment plan. He denies SI/HI or AV hallucinations. Problem: Education: Goal: Knowledge of [facility] Education information/materials will improve Outcome: Progressing Goal: Emotional status will improve Outcome: Progressing Goal: Mental status will improve Outcome: Progressing Goal: Verbalization of understanding the information provided will improve Outcome: Progressing   Problem: Activity: Goal: Interest or engagement in activities will improve Outcome: Progressing Goal: Sleeping patterns will improve Outcome: Progressing   Problem: Coping: Goal: Ability to verbalize frustrations and anger appropriately will improve Outcome: Progressing Goal: Ability to demonstrate self-control will improve Outcome: Progressing   Problem: Health Behavior/Discharge Planning: Goal: Identification of resources available to assist in meeting health care needs will improve Outcome: Progressing Goal: Compliance with treatment plan for underlying cause of condition will improve Outcome: Progressing   Problem: Physical Regulation: Goal: Ability to maintain clinical measurements within normal limits will improve Outcome: Progressing   Problem: Safety: Goal: Periods of time without injury will increase Outcome: Progressing   Problem: Education: Goal: Utilization of techniques to improve thought processes will improve Outcome: Progressing Goal: Knowledge of the prescribed therapeutic regimen will improve Outcome: Progressing   Problem: Activity: Goal: Interest or engagement in leisure activities will improve Outcome: Progressing Goal: Imbalance in normal sleep/wake cycle will improve Outcome: Progressing    Problem: Coping: Goal: Coping ability will improve Outcome: Progressing Goal: Will verbalize feelings Outcome: Progressing   Problem: Health Behavior/Discharge Planning: Goal: Ability to make decisions will improve Outcome: Progressing Goal: Compliance with therapeutic regimen will improve Outcome: Progressing   Problem: Role Relationship: Goal: Will demonstrate positive changes in social behaviors and relationships Outcome: Progressing   Problem: Safety: Goal: Ability to disclose and discuss suicidal ideas will improve Outcome: Progressing Goal: Ability to identify and utilize support systems that promote safety will improve Outcome: Progressing   Problem: Self-Concept: Goal: Will verbalize positive feelings about self Outcome: Progressing Goal: Level of anxiety will decrease Outcome: Progressing   Problem: Education: Goal: Ability to state activities that reduce stress will improve Outcome: Progressing   Problem: Coping: Goal: Ability to identify and develop effective coping behavior will improve Outcome: Progressing   Problem: Self-Concept: Goal: Ability to identify factors that promote anxiety will improve Outcome: Progressing Goal: Level of anxiety will decrease Outcome: Progressing Goal: Ability to modify response to factors that promote anxiety will improve Outcome: Progressing

## 2021-02-06 DIAGNOSIS — I10 Essential (primary) hypertension: Secondary | ICD-10-CM | POA: Diagnosis not present

## 2021-02-06 DIAGNOSIS — F332 Major depressive disorder, recurrent severe without psychotic features: Secondary | ICD-10-CM | POA: Diagnosis not present

## 2021-02-06 NOTE — Progress Notes (Signed)
Patient presents with sad, flat affect. Denies SI, HI, AVH. Endorses sadness, depression and loneliness. Medication compliant. PRn given for sleep as well as anti-anxiety. Patient ambulates with walker.  Slept most of night, up for bathroom. Encouragement and support provided. Safety checks maintained. Medications given as prescribed. Pt receptive and remains safe on unit with q 15 min checks.

## 2021-02-06 NOTE — Progress Notes (Signed)
Patient calm and pleasant during assessment, denies SI/HI/AVH. Patient endorses anxiety and depression. Patient endorses chronic pain, check MAR. Patient observed interacting appropriately with staff and peers on the unit. Patient compliant with medication administration per MD orders. Pt being monitored Q 15 minutes for safety per unit protocol. Pt remains safe on the unit.

## 2021-02-06 NOTE — [facility] Counselor (Addendum)
ADDENDUM  Patient has an appointment for Tuesday 25 October at 1130 AM.   Signed:  Durenda Hurt, MSW, [place], LCASA 02/06/2021 3:58 PM   CSW met with patient to discuss outpatient PT referral. Patient was agreeable to [facility]. CSW will send supporting documentation with written consent from patient. CSW will follow up with phone call.   Signed:  Durenda Hurt, MSW, [place], LCASA 02/06/2021 3:38 PM

## 2021-02-06 NOTE — BH IP Treatment Plan (Signed)
Interdisciplinary Treatment and Diagnostic Plan Update  02/06/2021 Time of Session: 0830 Chad [name] MRN: [id number]  Principal Diagnosis: HTN (hypertension)  Secondary Diagnoses: Principal Problem:   HTN (hypertension) Active Problems:   Alcohol abuse   Weakness   Hyponatremia   Severe recurrent major depression without psychotic features ([place])   Current Medications:  Current Facility-Administered Medications  Medication Dose Route Frequency Provider Last Rate Last Admin   acamprosate (CAMPRAL) tablet 666 mg  666 mg Oral TID Salley Scarlet, MD   666 mg at 02/06/21 1118   alum & mag hydroxide-simeth (MAALOX/MYLANTA) 200-200-20 MG/5ML suspension 30 mL  30 mL Oral Q4H PRN Clapacs, Madie Reno, MD       citalopram (CELEXA) tablet 20 mg  20 mg Oral Daily Salley Scarlet, MD   20 mg at 02/06/21 0803   feeding supplement (ENSURE ENLIVE / ENSURE PLUS) liquid 237 mL  237 mL Oral TID BM Clapacs, John T, MD   237 mL at 44/01/02 7253   folic acid (FOLVITE) tablet 1 mg  1 mg Oral Daily Clapacs, Madie Reno, MD   1 mg at 02/06/21 0803   gabapentin (NEURONTIN) capsule 300 mg  300 mg Oral TID Clapacs, Madie Reno, MD   300 mg at 02/06/21 1118   HYDROcodone-acetaminophen (NORCO/VICODIN) 5-325 MG per tablet 1-2 tablet  1-2 tablet Oral Q6H PRN Salley Scarlet, MD   2 tablet at 02/06/21 1250   hydrOXYzine (ATARAX/VISTARIL) tablet 25 mg  25 mg Oral TID PRN Clapacs, Madie Reno, MD   25 mg at 02/05/21 2033   lidocaine (LIDODERM) 5 % 1 patch  1 patch Transdermal Q24H Salley Scarlet, MD   1 patch at 02/06/21 1105   magnesium hydroxide (MILK OF MAGNESIA) suspension 30 mL  30 mL Oral Daily PRN Clapacs, Madie Reno, MD   30 mL at 02/01/21 1433   multivitamin with minerals tablet 1 tablet  1 tablet Oral Daily Clapacs, Madie Reno, MD   1 tablet at 02/06/21 0802   ondansetron (ZOFRAN) tablet 8 mg  8 mg Oral Q8H PRN Salley Scarlet, MD       pantoprazole (PROTONIX) EC tablet 40 mg  40 mg Oral Daily Clapacs, Madie Reno, MD   40 mg at  02/06/21 0802   thiamine tablet 100 mg  100 mg Oral Daily Salley Scarlet, MD   100 mg at 02/06/21 0802   traZODone (DESYREL) tablet 50 mg  50 mg Oral QHS PRN Clapacs, Madie Reno, MD   50 mg at 02/05/21 2032   PTA Medications: Medications Prior to Admission  Medication Sig Dispense Refill Last Dose   acetaminophen (TYLENOL) 325 MG tablet Take 2 tablets (650 mg total) by mouth every 6 (six) hours as needed for mild pain (pain score 1-3 or temp > 100.5).      citalopram (CELEXA) 10 MG tablet Take 1 tablet (10 mg total) by mouth daily.      feeding supplement (ENSURE ENLIVE / ENSURE PLUS) LIQD Take 237 mLs by mouth 3 (three) times daily between meals. 664 mL 12    folic acid (FOLVITE) 1 MG tablet Take 1 tablet (1 mg total) by mouth daily. (Patient not taking: No sig reported) 30 tablet 0    gabapentin (NEURONTIN) 300 MG capsule Take 1 capsule (300 mg total) by mouth 3 (three) times daily. 90 capsule 0    HYDROcodone-acetaminophen (NORCO/VICODIN) 5-325 MG tablet Take 1-2 tablets by mouth every 6 (six) hours as needed for moderate pain  or severe pain. 8 tablet 0    hydrOXYzine (ATARAX/VISTARIL) 25 MG tablet Take 1 tablet (25 mg total) by mouth 3 (three) times daily as needed for anxiety. 30 tablet 0    Multiple Vitamin (MULTIVITAMIN WITH MINERALS) TABS tablet Take 1 tablet by mouth daily. (Patient not taking: No sig reported) 30 tablet 0    pantoprazole (PROTONIX) 40 MG tablet Take 1 tablet (40 mg total) by mouth daily. 30 tablet 0    traZODone (DESYREL) 150 MG tablet Take 1 tablet (150 mg total) by mouth at bedtime. 30 tablet 0     Patient Stressors: Substance abuse    Patient Strengths: Ability for insight  Capable of independent living  Communication skills  Motivation for treatment/growth  Supportive family/friends   Treatment Modalities: Medication Management, Group therapy, Case management,  1 to 1 session with clinician, Psychoeducation, Recreational therapy.   Physician Treatment Plan  for Primary Diagnosis: HTN (hypertension) Long Term Goal(s): Improvement in symptoms so as ready for discharge   Short Term Goals: Ability to identify changes in lifestyle to reduce recurrence of condition will improve Ability to verbalize feelings will improve Ability to disclose and discuss suicidal ideas Ability to demonstrate self-control will improve Ability to identify and develop effective coping behaviors will improve Ability to maintain clinical measurements within normal limits will improve Compliance with prescribed medications will improve Ability to identify triggers associated with substance abuse/mental health issues will improve  Medication Management: Evaluate patient's response, side effects, and tolerance of medication regimen.  Therapeutic Interventions: 1 to 1 sessions, Unit Group sessions and Medication administration.  Evaluation of Outcomes: Not Met  Physician Treatment Plan for Secondary Diagnosis: Principal Problem:   HTN (hypertension) Active Problems:   Alcohol abuse   Weakness   Hyponatremia   Severe recurrent major depression without psychotic features ([place])  Long Term Goal(s): Improvement in symptoms so as ready for discharge   Short Term Goals: Ability to identify changes in lifestyle to reduce recurrence of condition will improve Ability to verbalize feelings will improve Ability to disclose and discuss suicidal ideas Ability to demonstrate self-control will improve Ability to identify and develop effective coping behaviors will improve Ability to maintain clinical measurements within normal limits will improve Compliance with prescribed medications will improve Ability to identify triggers associated with substance abuse/mental health issues will improve     Medication Management: Evaluate patient's response, side effects, and tolerance of medication regimen.  Therapeutic Interventions: 1 to 1 sessions, Unit Group sessions and Medication  administration.  Evaluation of Outcomes: Not Met   RN Treatment Plan for Primary Diagnosis: HTN (hypertension) Long Term Goal(s): Knowledge of disease and therapeutic regimen to maintain health will improve  Short Term Goals: Ability to remain free from injury will improve, Ability to verbalize frustration and anger appropriately will improve, Ability to demonstrate self-control, Ability to participate in decision making will improve, Ability to verbalize feelings will improve, Ability to disclose and discuss suicidal ideas, Ability to identify and develop effective coping behaviors will improve, and Compliance with prescribed medications will improve  Medication Management: RN will administer medications as ordered by provider, will assess and evaluate patient's response and provide education to patient for prescribed medication. RN will report any adverse and/or side effects to prescribing provider.  Therapeutic Interventions: 1 on 1 counseling sessions, Psychoeducation, Medication administration, Evaluate responses to treatment, Monitor vital signs and CBGs as ordered, Perform/monitor CIWA, COWS, AIMS and Fall Risk screenings as ordered, Perform wound care treatments as ordered.  Evaluation  of Outcomes: Not Met   LCSW Treatment Plan for Primary Diagnosis: HTN (hypertension) Long Term Goal(s): Safe transition to appropriate next level of care at discharge, Engage patient in therapeutic group addressing interpersonal concerns.  Short Term Goals: Engage patient in aftercare planning with referrals and resources, Increase social support, Increase ability to appropriately verbalize feelings, Increase emotional regulation, Facilitate acceptance of mental health diagnosis and concerns, Facilitate patient progression through stages of change regarding substance use diagnoses and concerns, and Identify triggers associated with mental health/substance abuse issues  Therapeutic Interventions: Assess  for all discharge needs, 1 to 1 time with Social worker, Explore available resources and support systems, Assess for adequacy in community support network, Educate family and significant other(s) on suicide prevention, Complete Psychosocial Assessment, Interpersonal group therapy.  Evaluation of Outcomes: Not Met   Progress in Treatment: Attending groups: Yes. Participating in groups: No. Taking medication as prescribed: Yes. Toleration medication: Yes. Family/Significant other contact made: No, will contact:  SPE completed with patient, declined consent for CSW to reach collateral.  Patient understands diagnosis: No. Discussing patient identified problems/goals with staff: Yes. Medical problems stabilized or resolved: Yes. Denies suicidal/homicidal ideation: Yes. Issues/concerns per patient self-inventory: Yes. Other: none  New problem(s) identified: No, Describe:  No additional Problems identified at this time.   New Short Term/Long Term Goal(s):Patient to continue to work towards detox, elimination of symptoms of psychosis, medication management for mood stabilization; elimination of SI thoughts; development of comprehensive mental wellness/sobriety plan.  Patient Goals:  No additional goals identified since last treatment team.   Discharge Plan or Barriers: Patient needs outpatient PT per recommendation from physical therapy. Patient interested in [facility].   Reason for Continuation of Hospitalization: Depression Suicidal ideation  Estimated Length of Stay: 1-7 days   Scribe for Treatment Team: [place], [place] 02/06/2021 3:14 PM

## 2021-02-06 NOTE — Progress Notes (Signed)
D: Pt alert and oriented. Pt rates depression 6/10, hopelessness 5/10, and anxiety 6/10. Pt reports energy level as normal and concentration as being good. Pt reports sleep last night as being fair. Pt did not receive medications for sleep. Pt reports experiencing 8/10 left hip pain, prn meds given prior to shift change. Pt denies experiencing any SI/HI, or AVH at this time.   Pt can be observed in the milieu and outside of pt room. Pt has been observed in dayroom with head on table with eyes closed as if sleep at times. Pt's pain does not appear to get any better even with pain medications it appears to consistently stay at an 8/10.   A: Scheduled medications administered to pt, per MD orders. Support and encouragement provided. Frequent verbal contact made. Routine safety checks conducted q15 minutes.   R: No adverse drug reactions noted. Pt verbally contracts for safety at this time. Pt complaint with medications. Pt interacts well with others on the unit. Pt remains safe at this time. Will continue to monitor.

## 2021-02-06 NOTE — Group Note (Signed)
[facility] LCSW Group Therapy Note   Group Date: 02/06/2021 Start Time: 1300 End Time: 1400   Type of Therapy/Topic:  Group Therapy:  Emotion Regulation  Participation Level:  None   Mood:  Description of Group:    The purpose of this group is to assist patients in learning to regulate negative emotions and experience positive emotions. Patients will be guided to discuss ways in which they have been vulnerable to their negative emotions. These vulnerabilities will be juxtaposed with experiences of positive emotions or situations, and patients challenged to use positive emotions to combat negative ones. Special emphasis will be placed on coping with negative emotions in conflict situations, and patients will process healthy conflict resolution skills.  Therapeutic Goals: Patient will identify two positive emotions or experiences to reflect on in order to balance out negative emotions:  Patient will label two or more emotions that they find the most difficult to experience:  Patient will be able to demonstrate positive conflict resolution skills through discussion or role plays:   Summary of Patient Progress: Patient was present for the entirety of group, however, did not engage in conversation.     Therapeutic Modalities:   Cognitive Behavioral Therapy Feelings Identification Dialectical Behavioral Therapy   Corky Crafts, [place]

## 2021-02-06 NOTE — Progress Notes (Signed)
Recreation Therapy Notes   Date: 02/06/2021  Time: 10:00 am  Location: Craft room   Behavioral response: Appropriate  Intervention Topic: Communication   Discussion/Intervention:  Group content today was focused on communication. The group defined communication and ways to communicate with others. Individuals stated reason why communication is important and some reasons to communicate with others. Patients expressed if they thought they were good at communicating with others and ways they could improve their communication skills. The group identified important parts of communication and some experiences they have had in the past with communication. The group participated in the intervention "Words in a Bag", where they had a chance to test out their communication skills and identify ways to improve their communication techniques.  Clinical Observations/Feedback: Patient came to group and was focused on what peers and staff had to say about communication. Individual was social with peers and staff while participating in the intervention.  Evan Osburn LRT/CTRS         Hope Brandenburger 02/06/2021 12:13 PM
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
## 2021-02-07 ENCOUNTER — Other Ambulatory Visit: Payer: Self-pay | Admitting: Psychiatry

## 2021-02-07 DIAGNOSIS — F332 Major depressive disorder, recurrent severe without psychotic features: Secondary | ICD-10-CM | POA: Diagnosis not present

## 2021-02-07 DIAGNOSIS — I10 Essential (primary) hypertension: Secondary | ICD-10-CM | POA: Diagnosis not present

## 2021-02-07 NOTE — Group Note (Signed)
LCSW Group Therapy Note   Group Date: 02/07/2021 Start Time: 1300 End Time: 1400   Type of Therapy and Topic:  Group Therapy:   Participation Level:  Minimal  Description of Group: CSW assisted patients in making SMART goals. CSW opened up group with an ice breaker before starting group in order to increase group cohesion. At the beginning of group we all went around to discuss what are goals were and how to set and achieve each goal. Members were able to provide feedback to each other and amend their goals appropriately.    Therapeutic Goals:  1. Patient will learn how to develop goals.  2. Patient will be able to identify each achieved goal. 3 Patient will be able to tie goal back into reason for hospital admission.    Summary of Patient Progress:   Patient was present for the entirety of group session. Patient participated in opening and closing remarks. However, patient did not contribute at all to the topic of discussion despite encouraged participation.    Therapeutic Modalities:   Tanner, Yeley 02/07/2021  2:26 PM

## 2021-02-07 NOTE — Progress Notes (Signed)
   02/07/21 1410  Clinical Encounter Type  Visited With Patient  Visit Type Follow-up;Spiritual support;Social support  Spiritual Encounters  Spiritual Needs Other (Comment) (social support)  Chaplain Burris greeted Pt while rounding on unit. Pt very quiet today but pleasant. Chaplain made herself available and communicated her interest in Pt's well being. Amy Sophronia Simas is chaplain on call 10/20 if needed.

## 2021-02-07 NOTE — [facility] Group Notes (Signed)
[facility] Group Notes:  (Nursing/MHT/Case Management/Adjunct)  Date:  02/07/2021  Time:  11:07 AM  Type of Therapy:  Community Meeting  Participation Level:  Did Not Attend  Lynelle Smoke [facility] 02/07/2021, 11:07 AM
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
## 2021-02-07 NOTE — Progress Notes (Signed)
Patient calm and pleasant during assessment, denies SI/HI/AVH. Patient endorses anxiety and depression. Patient endorses chronic pain, check MAR. Patient observed interacting appropriately with staff and peers on the unit. Patient compliant with medication administration per MD orders. Pt being monitored Q 15 minutes for safety per unit protocol. Pt remains safe on the unit. Pt given education on ECT which he is starting tomorrow

## 2021-02-07 NOTE — Progress Notes (Signed)
D: Pt alert and oriented. Pt rates depression 6/10, hopelessness 6/10, and anxiety 6/10. Pt reports energy level as normal and concentration as being good. Pt reports sleep last night as being good. Pt did receive medications for sleep and did find them helpful. Pt reports experiencing 8/10 left hip pain at this time. Pt denies experiencing any SI/HI, or AVH at this time.   A: Scheduled medications administered to pt, per MD orders. Support and encouragement provided. Frequent verbal contact made. Routine safety checks conducted q15 minutes.   R: No adverse drug reactions noted. Pt verbally contracts for safety at this time. Pt complaint with medications. Pt interacts well with others on the unit. Pt remains safe at this time. Will continue to monitor.

## 2021-02-07 NOTE — Progress Notes (Signed)
Recreation Therapy Notes  Date: 02/07/2021  Time: 9:30 AM   Location: Craft room    Behavioral response: N/A   Intervention Topic: Animal Assisted therapy    Discussion/Intervention: Patient did not attend group.   Clinical Observations/Feedback:  Patient did not attend group.   Lesly Pontarelli LRT/CTRS        Georgeanne Frankland 02/07/2021 12:45 PM
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
## 2021-02-07 NOTE — Progress Notes (Signed)
Physical Therapy Discharge Patient Details Name: Hillary Schwegler MRN: [id number] DOB: [birth year] Today's Date: 02/07/2021 Time: 1222-4114 PT Time Calculation (min) (ACUTE ONLY): 10 min  Patient discharged from PT services secondary to goals met and no further PT needs identified. He was easily and safely able to exit bed, stand, and ambulate without cues or assistance. PT does continue to recommend use of RW for additional safety. Will sign off.   Please see latest therapy progress note for current level of functioning and progress toward goals.    Progress and discharge plan discussed with patient and/or caregiver:   Julaine Fusi PTA 02/07/21, 2:04 PM

## 2021-02-08 ENCOUNTER — Encounter: Payer: Self-pay | Admitting: Psychiatry

## 2021-02-08 ENCOUNTER — Inpatient Hospital Stay: Payer: Medicare HMO | Admitting: Anesthesiology

## 2021-02-08 ENCOUNTER — Inpatient Hospital Stay: Payer: Medicare HMO

## 2021-02-08 DIAGNOSIS — F332 Major depressive disorder, recurrent severe without psychotic features: Secondary | ICD-10-CM | POA: Diagnosis not present

## 2021-02-08 DIAGNOSIS — I10 Essential (primary) hypertension: Secondary | ICD-10-CM | POA: Diagnosis not present

## 2021-02-08 LAB — SODIUM: Sodium: 139 mmol/L (ref 135–145)

## 2021-02-08 LAB — GLUCOSE, CAPILLARY: Glucose-Capillary: 100 mg/dL — ABNORMAL HIGH (ref 70–99)

## 2021-02-08 MED ORDER — GLYCOPYRROLATE 0.2 MG/ML IJ SOLN
INTRAMUSCULAR | Status: AC
  Administered 2021-02-08: 0.1 mg via INTRAVENOUS
  Filled 2021-02-08: qty 1

## 2021-02-08 MED ORDER — GABAPENTIN 300 MG PO CAPS
300.0000 mg | ORAL_CAPSULE | Freq: Once | ORAL | Status: AC
  Administered 2021-02-08: 300 mg via ORAL
  Filled 2021-02-08: qty 1

## 2021-02-08 MED ORDER — KETOROLAC TROMETHAMINE 30 MG/ML IJ SOLN
INTRAMUSCULAR | Status: AC
  Administered 2021-02-08: 30 mg via INTRAVENOUS
  Filled 2021-02-08: qty 1

## 2021-02-08 MED ORDER — METHOHEXITAL SODIUM 100 MG/10ML IV SOSY
PREFILLED_SYRINGE | INTRAVENOUS | Status: DC | PRN
  Administered 2021-02-08: 80 mg via INTRAVENOUS

## 2021-02-08 MED ORDER — KETOROLAC TROMETHAMINE 30 MG/ML IJ SOLN: 30.0000 mg | Freq: Once | INTRAMUSCULAR | Status: AC

## 2021-02-08 MED ORDER — SUCCINYLCHOLINE CHLORIDE 200 MG/10ML IV SOSY
PREFILLED_SYRINGE | INTRAVENOUS | Status: DC | PRN
  Administered 2021-02-08: 100 mg via INTRAVENOUS

## 2021-02-08 MED ORDER — SODIUM CHLORIDE 0.9 % IV SOLN: INTRAVENOUS | Status: DC | PRN

## 2021-02-08 MED ORDER — SODIUM CHLORIDE 0.9 % IV SOLN
500.0000 mL | Freq: Once | INTRAVENOUS | Status: AC
  Administered 2021-02-08: 500 mL via INTRAVENOUS

## 2021-02-08 MED ORDER — GLYCOPYRROLATE 0.2 MG/ML IJ SOLN: 0.1000 mg | Freq: Once | INTRAMUSCULAR | Status: AC

## 2021-02-08 NOTE — Progress Notes (Signed)
Patient left for ECT

## 2021-02-08 NOTE — Progress Notes (Signed)
Patient c/o headache. Rates pain 10/10. RN informed patient he is unable to receive pain medication for an hour and a half. RN give patient ice pack for head and cold water. Instructed patient to lay in dark room until he is able to receive medication. Will cont to monitor.
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
## 2021-02-08 NOTE — Progress Notes (Signed)
Recreation Therapy Notes   Date: 02/08/2021  Time: 10:15 am   Location: Craft room   Behavioral response: Appropriate  Intervention Topic: Teamwork    Discussion/Intervention:  Group content on today was focused on teamwork. The group identified what teamwork is. Individuals described who is a part of their team. Patients expressed why they thought teamwork is important. The group stated reasons why they thought it was easier to work with a Comptroller team. Individuals discussed some positives and negatives of working with a team. Patients gave examples of past experiences they had while working with a team. The group participated in the intervention "Story in a bag", patients were in groups and were able to test their skill in a team setting.  Clinical Observations/Feedback: Patient came to group and was focused on what peer staff had to say about team work.  Individual was social with staff while participating in the intervention. Patient was pulled from group for ECT.  Drey Shaff LRT/CTRS         Reannon Candella 02/08/2021 12:44 PM

## 2021-02-08 NOTE — Progress Notes (Signed)
Patient alert and oriented this day. Present on milieu off and on. Participating in groups and interacting appropriately with peers. Denies SI/HI/AH/VH. Has ongoing pain that remains a 8/10.   Labs and vital signs monitored before and after ECT. Patient supported emotionally and encouraged to verbalize concerns. All concerns addressed.    No adverse reactions noted. Cont Q15 minute check for safety.
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
## 2021-02-08 NOTE — Progress Notes (Signed)
Patient resting in bed at assessment. Meds given upon return. See MAR. No adverse reactions noted. Cont Q15 minute check for safety.

## 2021-02-08 NOTE — Procedures (Signed)
ECT SERVICES Physician's Interval Evaluation & Treatment Note  Patient Identification: Torben Soloway MRN:  [id number] Date of Evaluation:  02/08/2021 TX #: 1  MADRS:   MMSE:   P.E. Findings:  Patient appears to have lost weight.  Deconditioned.  Heart and lungs stable.  Ambulatory.  Psychiatric Interval Note:  Depressed hopeless negative low energy.  Passive suicidal thoughts  Subjective:  Patient is a 70 y.o. male seen for evaluation for Electroconvulsive Therapy. Feeling depressed but hopeful about ECT  Treatment Summary:   [x]   Right Unilateral             []  Bilateral   % Energy : 0.3 ms 35%   Impedance: 1910 ohms  Seizure Energy Index: 8564 V squared  Postictal Suppression Index: No reading  Seizure Concordance Index: 57%  Medications  Pre Shock: Robinul 0.1 mg, Toradol 30 mg, Brevital 80 mg succinylcholine 100 mg  Post Shock:    Seizure Duration: 35 seconds EMG 54 seconds EEG   Comments: Next treatment Monday  Lungs:  [x]   Clear to auscultation               []  Other:   Heart:    [x]   Regular rhythm             []  irregular rhythm    [x]   Previous H&P reviewed, patient examined and there are NO CHANGES                 []   Previous H&P reviewed, patient examined and there are changes noted.   , MD 10/21/20226:41 PM

## 2021-02-08 NOTE — Transfer of Care (Signed)
Immediate Anesthesia Transfer of Care Note  Patient: Chad [name]  Procedure(s) Performed: ECT TX  Patient Location: PACU  Anesthesia Type:General  Level of Consciousness: drowsy  Airway & Oxygen Therapy: Patient Spontanous Breathing and Patient connected to face mask oxygen  Post-op Assessment: Report given to RN and Post -op Vital signs reviewed and stable  Post vital signs: Reviewed and stable  Last Vitals:  Vitals Value Taken Time  BP 171/93   Temp    Pulse 117 02/08/21 1310  Resp 11 02/08/21 1310  SpO2 99 % 02/08/21 1310  Vitals shown include unvalidated device data.  Last Pain:  Vitals:   02/08/21 1158  TempSrc:   PainSc: 4       Patients Stated Pain Goal: 0 (02/07/21 0743)  Complications: No notable events documented.

## 2021-02-08 NOTE — [facility] Group Notes (Signed)
[facility] Group Notes:  (Nursing/MHT/Case Management/Adjunct)  Date:  02/08/2021  Time:  9:20 AM  Type of Therapy:  Group Therapy  Participation Level:  Active  Participation Quality:  Appropriate and Attentive  Affect:  Appropriate  Cognitive:  Appropriate  Insight:  Appropriate and Good  Engagement in Group:  Engaged  Modes of Intervention:  Support  Summary of Progress/Problems: Staff engaged in morning group therapy. Introducing self to patient and asking if the patient had a goal for the day and how they are feeling now since their day of admission. Patient actively engaged in group where he stated that his goal was to get through ECT procedure and to see the better/positive side of things. Patient states that he is feeling a little better and feels that this procedure may help with this as well.   [name] 02/08/2021, 9:20 AM

## 2021-02-08 NOTE — Anesthesia Postprocedure Evaluation (Signed)
Anesthesia Post Note  Patient: Chad [name]  Procedure(s) Performed: ECT TX  Patient location during evaluation: PACU Anesthesia Type: General Level of consciousness: awake and alert Pain management: pain level controlled Vital Signs Assessment: post-procedure vital signs reviewed and stable Respiratory status: spontaneous breathing, nonlabored ventilation, respiratory function stable and patient connected to nasal cannula oxygen Cardiovascular status: blood pressure returned to baseline and stable Postop Assessment: no apparent nausea or vomiting Anesthetic complications: no   No notable events documented.   Last Vitals:  Vitals:   02/08/21 1315 02/08/21 1320  BP: (!) 160/99 (!) 151/96  Pulse: 97 95  Resp: 17 11  Temp:    SpO2: 100% 97%    Last Pain:  Vitals:   02/08/21 1321  TempSrc:   PainSc: 0-No pain                 Cleda Mccreedy Brodan Grewell
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
## 2021-02-08 NOTE — Progress Notes (Signed)
Patient NPO for ECT. Npo status sign present on door and patient verbalized understanding. When asked by RN if he was not eating patient states, "no, but I drank coffee. When RN asked further about coffee. Patient states, " I didn't put any cream or sugar in it." RN  continued to interview patient to ensure  no other food was consumed, educated patient on the safety reasons for being place on NPO status and asked patient not to eat or drink anything else.Patient agreed not to eat or drink anything else. MD informed that patient drank coffee. Per MD coffee is acceptable as it is a clear liquid.  Cont to monitor.

## 2021-02-08 NOTE — Anesthesia Procedure Notes (Signed)
Date/Time: 02/08/2021 12:47 PM Performed by: Stormy Fabian, CRNA Pre-anesthesia Checklist: Patient identified, Emergency Drugs available, Suction available and Patient being monitored Patient Re-evaluated:Patient Re-evaluated prior to induction Oxygen Delivery Method: Circle system utilized Preoxygenation: Pre-oxygenation with 100% oxygen Induction Type: IV induction Ventilation: Mask ventilation without difficulty and Mask ventilation throughout procedure Airway Equipment and Method: Bite block Placement Confirmation: positive ETCO2 Dental Injury: Teeth and Oropharynx as per pre-operative assessment

## 2021-02-08 NOTE — Progress Notes (Signed)
Patient returned from ECT at 1400. Vital signs assessed and am medication given. See MAR for details. Patient status changed from NPO to Regular diet and meal ordered. Patient agitated and c/o pain. Rates 9/10. Pain mediation given. Pharmacy notified of medication times and doses needing to be rescheduled. Pharmacy staff agreed to rescheduled gabapentin and campral for later today. No s/s of distress noted. Will continue to monitor for safety.

## 2021-02-08 NOTE — Plan of Care (Signed)
  Problem: Education: Goal: Knowledge of [facility] Education information/materials will improve Outcome: Progressing Goal: Emotional status will improve Outcome: Progressing Goal: Mental status will improve Outcome: Progressing Goal: Verbalization of understanding the information provided will improve Outcome: Progressing   Problem: Activity: Goal: Interest or engagement in activities will improve Outcome: Progressing Goal: Sleeping patterns will improve Outcome: Progressing   Problem: Coping: Goal: Ability to verbalize frustrations and anger appropriately will improve Outcome: Progressing Goal: Ability to demonstrate self-control will improve Outcome: Progressing   Problem: Coping: Goal: Ability to verbalize frustrations and anger appropriately will improve Outcome: Progressing Goal: Ability to demonstrate self-control will improve Outcome: Progressing   Problem: Health Behavior/Discharge Planning: Goal: Identification of resources available to assist in meeting health care needs will improve Outcome: Progressing Goal: Compliance with treatment plan for underlying cause of condition will improve Outcome: Progressing   Problem: Physical Regulation: Goal: Ability to maintain clinical measurements within normal limits will improve Outcome: Progressing   Problem: Safety: Goal: Periods of time without injury will increase Outcome: Progressing   Problem: Safety: Goal: Periods of time without injury will increase Outcome: Progressing   Problem: Education: Goal: Utilization of techniques to improve thought processes will improve Outcome: Progressing Goal: Knowledge of the prescribed therapeutic regimen will improve Outcome: Progressing   Problem: Activity: Goal: Interest or engagement in leisure activities will improve Outcome: Progressing Goal: Imbalance in normal sleep/wake cycle will improve Outcome: Progressing   Problem: Coping: Goal: Coping ability  will improve Outcome: Progressing Goal: Will verbalize feelings Outcome: Progressing   Problem: Health Behavior/Discharge Planning: Goal: Ability to make decisions will improve Outcome: Progressing Goal: Compliance with therapeutic regimen will improve Outcome: Progressing   Problem: Role Relationship: Goal: Will demonstrate positive changes in social behaviors and relationships Outcome: Progressing   Problem: Safety: Goal: Ability to disclose and discuss suicidal ideas will improve Outcome: Progressing Goal: Ability to identify and utilize support systems that promote safety will improve Outcome: Progressing   Problem: Self-Concept: Goal: Will verbalize positive feelings about self Outcome: Progressing Goal: Level of anxiety will decrease Outcome: Progressing   Problem: Education: Goal: Ability to state activities that reduce stress will improve Outcome: Progressing   Problem: Coping: Goal: Ability to identify and develop effective coping behavior will improve Outcome: Progressing   Problem: Self-Concept: Goal: Ability to identify factors that promote anxiety will improve Outcome: Progressing Goal: Level of anxiety will decrease Outcome: Progressing Goal: Ability to modify response to factors that promote anxiety will improve Outcome: Progressing

## 2021-02-08 NOTE — Group Note (Signed)
[facility] LCSW Group Therapy Note   Group Date: 02/08/2021 Start Time: 1300 End Time: 1400  Type of Therapy and Topic:  Group Therapy:  Feelings around Relapse and Recovery  Participation Level:  Did Not Attend   Mood:  Description of Group:    Patients in this group will discuss emotions they experience before and after a relapse. They will process how experiencing these feelings, or avoidance of experiencing them, relates to having a relapse. Facilitator will guide patients to explore emotions they have related to recovery. Patients will be encouraged to process which emotions are more powerful. They will be guided to discuss the emotional reaction significant others in their lives may have to patients' relapse or recovery. Patients will be assisted in exploring ways to respond to the emotions of others without this contributing to a relapse.  Therapeutic Goals: Patient will identify two or more emotions that lead to relapse for them:  Patient will identify two emotions that result when they relapse:  Patient will identify two emotions related to recovery:  Patient will demonstrate ability to communicate their needs through discussion and/or role plays.   Summary of Patient Progress:  Group was not held due to acuity of unit.      Therapeutic Modalities:   Cognitive Behavioral Therapy Solution-Focused Therapy Assertiveness Training Relapse Prevention Therapy   Sipriano Fendley J Makail Watling, LCSW 
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
## 2021-02-09 NOTE — Plan of Care (Signed)
  Problem: Activity: Goal: Interest or engagement in activities will improve Outcome: Progressing Goal: Sleeping patterns will improve Outcome: Progressing   Problem: Coping: Goal: Ability to verbalize frustrations and anger appropriately will improve Outcome: Progressing Goal: Ability to demonstrate self-control will improve Outcome: Progressing   Problem: Health Behavior/Discharge Planning: Goal: Identification of resources available to assist in meeting health care needs will improve Outcome: Progressing Goal: Compliance with treatment plan for underlying cause of condition will improve Outcome: Progressing   Problem: Physical Regulation: Goal: Ability to maintain clinical measurements within normal limits will improve Outcome: Progressing   Problem: Safety: Goal: Periods of time without injury will increase Outcome: Progressing

## 2021-02-09 NOTE — Progress Notes (Signed)
No behavioral problems overnight, he received PRN 2 tablets of   HYDROcodone-acetaminophen (NORCO/VICODIN) 5-325 MG HYDROcodone-acetaminophen (NORCO/VICODIN) 5-325 MG    See MAR for time and effectiveness.  No behavioral problems overnight, he denied avh/hi/si. No falls, he uses a walker for ambulation. He is resting in the day area at this time. He slept 8.0 hrs overnight.

## 2021-02-09 NOTE — Progress Notes (Signed)
Patient alert and oriented this day. Present on milieu off and on. Participating in groups and interacting appropriately with peers. Denies SI/HI/AH/VH. Has ongoing pain that remains a 8/10.    Labs and vital signs monitored. Patient supported emotionally and encouraged to verbalize concerns. All concerns addressed.   No adverse reactions noted. Cont Q15 minute check for safety.   Patient requested for lidocaine patch to be switched to night shift to help control pain at night. Spoke with pharmacy regarding patient's request. Medication moved to 8 pm. Cont Q 15 minute check for safety.

## 2021-02-09 NOTE — Group Note (Signed)
LCSW Group Therapy Note  Group Date: 02/09/2021 Start Time: 1315 End Time: 1415   Type of Therapy and Topic:  Group Therapy - Healthy vs Unhealthy Coping Skills  Participation Level:  Minimal   Description of Group The focus of this group was to determine what unhealthy coping techniques typically are used by group members and what healthy coping techniques would be helpful in coping with various problems. Patients were guided in becoming aware of the differences between healthy and unhealthy coping techniques. Patients were asked to identify 2-3 healthy coping skills they would like to learn to use more effectively.  Therapeutic Goals Patients learned that coping is what human beings do all day long to deal with various situations in their lives Patients defined and discussed healthy vs unhealthy coping techniques Patients identified their preferred coping techniques and identified whether these were healthy or unhealthy Patients determined 2-3 healthy coping skills they would like to become more familiar with and use more often. Patients provided support and ideas to each other   Summary of Patient Progress:  Patient presented to group with a dysthymic affect. Patient had difficulty participating in the ice breaker due to being unable to identify a "fun fact" about himself. Patient was eventually able to participate with encouragement. Patient identified writing in a journal as a coping strategy that is helpful for him.    Therapeutic Modalities Cognitive Behavioral Therapy Motivational Interviewing  Marletta Lor 02/09/2021  5:48 PM

## 2021-02-09 NOTE — Plan of Care (Signed)
  Problem: Education: Goal: Knowledge of [facility] Education information/materials will improve Outcome: Progressing Goal: Emotional status will improve Outcome: Progressing Goal: Mental status will improve Outcome: Progressing Goal: Verbalization of understanding the information provided will improve Outcome: Progressing   Problem: Activity: Goal: Interest or engagement in activities will improve Outcome: Progressing Goal: Sleeping patterns will improve Outcome: Progressing   Problem: Coping: Goal: Ability to verbalize frustrations and anger appropriately will improve Outcome: Progressing Goal: Ability to demonstrate self-control will improve Outcome: Progressing   Problem: Health Behavior/Discharge Planning: Goal: Identification of resources available to assist in meeting health care needs will improve Outcome: Progressing Goal: Compliance with treatment plan for underlying cause of condition will improve Outcome: Progressing   Problem: Physical Regulation: Goal: Ability to maintain clinical measurements within normal limits will improve Outcome: Progressing   Problem: Safety: Goal: Periods of time without injury will increase Outcome: Progressing   Problem: Education: Goal: Utilization of techniques to improve thought processes will improve Outcome: Progressing Goal: Knowledge of the prescribed therapeutic regimen will improve Outcome: Progressing   Problem: Coping: Goal: Coping ability will improve Outcome: Progressing Goal: Will verbalize feelings Outcome: Progressing   Problem: Health Behavior/Discharge Planning: Goal: Ability to make decisions will improve Outcome: Progressing Goal: Compliance with therapeutic regimen will improve Outcome: Progressing   Problem: Role Relationship: Goal: Will demonstrate positive changes in social behaviors and relationships Outcome: Progressing

## 2021-02-10 LAB — GLUCOSE, CAPILLARY: Glucose-Capillary: 117 mg/dL — ABNORMAL HIGH (ref 70–99)

## 2021-02-10 NOTE — Plan of Care (Signed)
  Problem: Education: Goal: Knowledge of [facility] Education information/materials will improve Outcome: Progressing Goal: Emotional status will improve Outcome: Progressing Goal: Mental status will improve Outcome: Progressing Goal: Verbalization of understanding the information provided will improve Outcome: Progressing   Problem: Coping: Goal: Ability to verbalize frustrations and anger appropriately will improve Outcome: Progressing Goal: Ability to demonstrate self-control will improve Outcome: Progressing   

## 2021-02-10 NOTE — Progress Notes (Signed)
Patient alert and oriented this day. Present on milieu off and on. Participating in groups and interacting appropriately with peers. Denies SI/HI/AH/VH. Rates depression a 6/10 and anxiety 5/10. Has ongoing pain that remains a 8/10. Prn medication given this shift. Patient has to be prompted to take scheduled medications.    Labs and vital signs monitored. Patient supported emotionally and encouraged to verbalize concerns. All concerns addressed. Patient must be  prompted to take scheduled medications.  No adverse reactions noted. Cont Q15 minute check for safety.

## 2021-02-10 NOTE — Plan of Care (Signed)
  Problem: Education: Goal: Knowledge of [facility] Education information/materials will improve Outcome: Progressing Goal: Emotional status will improve Outcome: Progressing Goal: Mental status will improve Outcome: Progressing Goal: Verbalization of understanding the information provided will improve Outcome: Progressing   Problem: Activity: Goal: Interest or engagement in activities will improve Outcome: Progressing Goal: Sleeping patterns will improve Outcome: Progressing   Problem: Coping: Goal: Ability to verbalize frustrations and anger appropriately will improve Outcome: Progressing Goal: Ability to demonstrate self-control will improve Outcome: Progressing   Problem: Health Behavior/Discharge Planning: Goal: Identification of resources available to assist in meeting health care needs will improve Outcome: Progressing Goal: Compliance with treatment plan for underlying cause of condition will improve Outcome: Progressing   Problem: Physical Regulation: Goal: Ability to maintain clinical measurements within normal limits will improve Outcome: Progressing   Problem: Education: Goal: Utilization of techniques to improve thought processes will improve Outcome: Progressing Goal: Knowledge of the prescribed therapeutic regimen will improve Outcome: Progressing   Problem: Activity: Goal: Interest or engagement in leisure activities will improve Outcome: Progressing Goal: Imbalance in normal sleep/wake cycle will improve Outcome: Progressing   Problem: Coping: Goal: Coping ability will improve Outcome: Progressing Goal: Will verbalize feelings Outcome: Progressing   Problem: Health Behavior/Discharge Planning: Goal: Ability to make decisions will improve Outcome: Progressing Goal: Compliance with therapeutic regimen will improve Outcome: Progressing   Problem: Self-Concept: Goal: Will verbalize positive feelings about self Outcome: Progressing Goal:  Level of anxiety will decrease Outcome: Progressing   Problem: Safety: Goal: Ability to disclose and discuss suicidal ideas will improve Outcome: Progressing Goal: Ability to identify and utilize support systems that promote safety will improve Outcome: Progressing   Problem: Self-Concept: Goal: Ability to identify factors that promote anxiety will improve Outcome: Progressing Goal: Level of anxiety will decrease Outcome: Progressing Goal: Ability to modify response to factors that promote anxiety will improve Outcome: Progressing   Problem: Coping: Goal: Ability to identify and develop effective coping behavior will improve Outcome: Progressing

## 2021-02-10 NOTE — Group Note (Signed)
LCSW Group Therapy Note  Group Date: 02/10/2021 Start Time: 1300 End Time: 1400   Type of Therapy and Topic:  Group Therapy - How To Cope with Nervousness about Discharge   Participation Level:  Did Not Attend   Description of Group This process group involved identification of patients' feelings about discharge. Some of them are scheduled to be discharged soon, while others are new admissions, but each of them was asked to share thoughts and feelings surrounding discharge from the hospital. One common theme was that they are excited at the prospect of going home, while another was that many of them are apprehensive about sharing why they were hospitalized. Patients were given the opportunity to discuss these feelings with their peers in preparation for discharge.  Therapeutic Goals  Patient will identify their overall feelings about pending discharge. Patient will think about how they might proactively address issues that they believe will once again arise once they get home (i.e. with parents). Patients will participate in discussion about having hope for change.   Summary of Patient Progress: Patient did not attend group despite encouraged participation.   Therapeutic Modalities Cognitive Behavioral Therapy   Chad [name] 02/10/2021  3:55 PM

## 2021-02-10 NOTE — Progress Notes (Signed)
Patient has been isolative to his room.  He denies si/hi/avh  Endorses depression, anxiety and pain at a constant 8/10.  He denies si/hi/avh  He is med compliant and tolerated his meds without incident. Will continue to monitor with Q 15 minute safety rounds.  Encouraged him to seek staff for any concerns.    Cleo Butler-Nicholson, LPN

## 2021-02-10 NOTE — Progress Notes (Signed)
Patient calm and pleasant during assessment, denies SI/HI/AVH. Patient endorses anxiety and depression. Patient endorses chronic pain, check MAR. Patient observed interacting appropriately with staff and peers on the unit. Patient compliant with medication administration per MD orders. Pt being monitored Q 15 minutes for safety per unit protocol. Pt remains safe on the unit. Pt given education on his ECT tomorrow.
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
## 2021-02-11 ENCOUNTER — Other Ambulatory Visit: Payer: Self-pay | Admitting: Psychiatry

## 2021-02-11 ENCOUNTER — Inpatient Hospital Stay: Payer: Medicare HMO | Admitting: Certified Registered"

## 2021-02-11 ENCOUNTER — Inpatient Hospital Stay: Payer: Medicare HMO

## 2021-02-11 ENCOUNTER — Encounter: Payer: Self-pay | Admitting: Psychiatry

## 2021-02-11 LAB — GLUCOSE, CAPILLARY: Glucose-Capillary: 97 mg/dL (ref 70–99)

## 2021-02-11 MED ORDER — LABETALOL HCL 5 MG/ML IV SOLN
INTRAVENOUS | Status: DC | PRN
  Administered 2021-02-11: 10 mg via INTRAVENOUS

## 2021-02-11 MED ORDER — LABETALOL HCL 5 MG/ML IV SOLN
INTRAVENOUS | Status: AC
  Filled 2021-02-11: qty 4

## 2021-02-11 MED ORDER — SUCCINYLCHOLINE CHLORIDE 200 MG/10ML IV SOSY
PREFILLED_SYRINGE | INTRAVENOUS | Status: DC | PRN
  Administered 2021-02-11: 100 mg via INTRAVENOUS

## 2021-02-11 MED ORDER — IPRATROPIUM-ALBUTEROL 0.5-2.5 (3) MG/3ML IN SOLN
3.0000 mL | RESPIRATORY_TRACT | Status: AC
  Administered 2021-02-11: 3 mL via RESPIRATORY_TRACT

## 2021-02-11 MED ORDER — IPRATROPIUM-ALBUTEROL 0.5-2.5 (3) MG/3ML IN SOLN: 3.0000 mL | RESPIRATORY_TRACT | Status: AC

## 2021-02-11 MED ORDER — METHOHEXITAL SODIUM 100 MG/10ML IV SOSY
PREFILLED_SYRINGE | INTRAVENOUS | Status: DC | PRN
  Administered 2021-02-11: 80 mg via INTRAVENOUS

## 2021-02-11 MED ORDER — IPRATROPIUM-ALBUTEROL 0.5-2.5 (3) MG/3ML IN SOLN
RESPIRATORY_TRACT | Status: AC
  Filled 2021-02-11: qty 3

## 2021-02-11 MED ORDER — SODIUM CHLORIDE 0.9 % IV SOLN: INTRAVENOUS | Status: DC | PRN

## 2021-02-11 MED ORDER — ACETAMINOPHEN 325 MG PO TABS
650.0000 mg | ORAL_TABLET | ORAL | Status: AC
  Administered 2021-02-11: 650 mg via ORAL

## 2021-02-11 MED ORDER — IPRATROPIUM-ALBUTEROL 0.5-2.5 (3) MG/3ML IN SOLN
RESPIRATORY_TRACT | Status: AC
  Administered 2021-02-11: 3 mL via RESPIRATORY_TRACT
  Filled 2021-02-11: qty 3

## 2021-02-11 MED ORDER — ACETAMINOPHEN 325 MG PO TABS
ORAL_TABLET | ORAL | Status: AC
  Filled 2021-02-11: qty 2

## 2021-02-11 NOTE — Progress Notes (Signed)
Recreation Therapy Notes  Date: 02/11/2021  Time: 10:00 am   Location: Craft room    Behavioral response: N/A   Intervention Topic: Coping skills   Discussion/Intervention: Patient did not attend group.   Clinical Observations/Feedback:  Patient did not attend group.   Doron Shake LRT/CTRS        Toshie Demelo 02/11/2021 11:27 AM
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
## 2021-02-11 NOTE — Group Note (Signed)
[facility] LCSW Group Therapy Note    Group Date: 02/11/2021 Start Time: 1300 End Time: 1400  Type of Therapy and Topic:  Group Therapy:  Overcoming Obstacles  Participation Level:  [facility] PARTICIPATION LEVEL: Did Not Attend  Mood:  Description of Group:   In this group patients will be encouraged to explore what they see as obstacles to their own wellness and recovery. They will be guided to discuss their thoughts, feelings, and behaviors related to these obstacles. The group will process together ways to cope with barriers, with attention given to specific choices patients can make. Each patient will be challenged to identify changes they are motivated to make in order to overcome their obstacles. This group will be process-oriented, with patients participating in exploration of their own experiences as well as giving and receiving support and challenge from other group members.  Therapeutic Goals: 1. Patient will identify personal and current obstacles as they relate to admission. 2. Patient will identify barriers that currently interfere with their wellness or overcoming obstacles.  3. Patient will identify feelings, thought process and behaviors related to these barriers. 4. Patient will identify two changes they are willing to make to overcome these obstacles:    Summary of Patient Progress   X   Therapeutic Modalities:   Cognitive Behavioral Therapy Solution Focused Therapy Motivational Interviewing Relapse Prevention Therapy   Bryten Maher J Texie Tupou, LCSW 
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
## 2021-02-11 NOTE — Procedures (Signed)
ECT SERVICES Physician's Interval Evaluation & Treatment Note  Patient Identification: Chad [name] MRN:  [id number] Date of Evaluation:  02/11/2021 TX #: 2  MADRS:   MMSE:   P.E. Findings:  Patient looks a little bit more alert.  Probably eating better  Psychiatric Interval Note:  Actually more interactive seemed less down.  Subjective:  Patient is a 70 y.o. male seen for evaluation for Electroconvulsive Therapy. No complaint  Treatment Summary:   [x]   Right Unilateral             []  Bilateral   % Energy : 0.3 ms 55%   Impedance: 2130 ohms  Seizure Energy Index: 8598 V squared  Postictal Suppression Index: 86%  Seizure Concordance Index: 64%  Medications  Pre Shock: Toradol 30 mg Brevital 80 mg succinylcholine 100 mg  Post Shock: None  Seizure Duration: 37 seconds EMG 50 seconds EEG   Comments: Next treatment Wednesday  Lungs:  [x]   Clear to auscultation               []  Other:   Heart:    [x]   Regular rhythm             []  irregular rhythm    [x]   Previous H&P reviewed, patient examined and there are NO CHANGES                 []   Previous H&P reviewed, patient examined and there are changes noted.   2131, MD 10/24/20226:04 PM

## 2021-02-11 NOTE — BH IP Treatment Plan (Signed)
Interdisciplinary Treatment and Diagnostic Plan Update  02/11/2021 Time of Session: 8:30AM Chad [name] MRN: [id number]  Principal Diagnosis: Severe recurrent major depression without psychotic features [facility])  Secondary Diagnoses: Principal Problem:   Severe recurrent major depression without psychotic features (HCC) Active Problems:   HTN (hypertension)   Alcohol abuse   Weakness   Hyponatremia   Current Medications:  Current Facility-Administered Medications  Medication Dose Route Frequency Provider Last Rate Last Admin   acamprosate (CAMPRAL) tablet 666 mg  666 mg Oral TID Jesse Sans, MD   666 mg at 02/10/21 1705   alum & mag hydroxide-simeth (MAALOX/MYLANTA) 200-200-20 MG/5ML suspension 30 mL  30 mL Oral Q4H PRN Clapacs, Jackquline Denmark, MD       citalopram (CELEXA) tablet 20 mg  20 mg Oral Daily Les Pou M, MD   20 mg at 02/10/21 0900   feeding supplement (ENSURE ENLIVE / ENSURE PLUS) liquid 237 mL  237 mL Oral TID BM Clapacs, John T, MD   237 mL at 02/10/21 2113   folic acid (FOLVITE) tablet 1 mg  1 mg Oral Daily Clapacs, John T, MD   1 mg at 02/10/21 0900   gabapentin (NEURONTIN) capsule 300 mg  300 mg Oral TID Clapacs, Jackquline Denmark, MD   300 mg at 02/10/21 1705   HYDROcodone-acetaminophen (NORCO/VICODIN) 5-325 MG per tablet 1-2 tablet  1-2 tablet Oral Q6H PRN Jesse Sans, MD   2 tablet at 02/11/21 4098   hydrOXYzine (ATARAX/VISTARIL) tablet 25 mg  25 mg Oral TID PRN Clapacs, Jackquline Denmark, MD   25 mg at 02/09/21 2102   lidocaine (LIDODERM) 5 % 1 patch  1 patch Transdermal Q24H Jesse Sans, MD   1 patch at 02/07/21 1191   magnesium hydroxide (MILK OF MAGNESIA) suspension 30 mL  30 mL Oral Daily PRN Clapacs, Jackquline Denmark, MD   30 mL at 02/01/21 1433   multivitamin with minerals tablet 1 tablet  1 tablet Oral Daily Clapacs, Jackquline Denmark, MD   1 tablet at 02/10/21 0900   ondansetron (ZOFRAN) tablet 8 mg  8 mg Oral Q8H PRN Jesse Sans, MD       pantoprazole (PROTONIX) EC tablet 40 mg   40 mg Oral Daily Clapacs, John T, MD   40 mg at 02/10/21 0900   thiamine tablet 100 mg  100 mg Oral Daily Les Pou M, MD   100 mg at 02/10/21 0900   traZODone (DESYREL) tablet 50 mg  50 mg Oral QHS PRN Clapacs, Jackquline Denmark, MD   50 mg at 02/10/21 2106   PTA Medications: Medications Prior to Admission  Medication Sig Dispense Refill Last Dose   acetaminophen (TYLENOL) 325 MG tablet Take 2 tablets (650 mg total) by mouth every 6 (six) hours as needed for mild pain (pain score 1-3 or temp > 100.5).   02/07/2021   citalopram (CELEXA) 10 MG tablet Take 1 tablet (10 mg total) by mouth daily.   02/07/2021   feeding supplement (ENSURE ENLIVE / ENSURE PLUS) LIQD Take 237 mLs by mouth 3 (three) times daily between meals. 237 mL 12 02/07/2021   folic acid (FOLVITE) 1 MG tablet Take 1 tablet (1 mg total) by mouth daily. 30 tablet 0 02/07/2021   gabapentin (NEURONTIN) 300 MG capsule Take 1 capsule (300 mg total) by mouth 3 (three) times daily. 90 capsule 0 02/07/2021   HYDROcodone-acetaminophen (NORCO/VICODIN) 5-325 MG tablet Take 1-2 tablets by mouth every 6 (six) hours as needed for moderate pain  or severe pain. 8 tablet 0 02/07/2021   hydrOXYzine (ATARAX/VISTARIL) 25 MG tablet Take 1 tablet (25 mg total) by mouth 3 (three) times daily as needed for anxiety. 30 tablet 0 02/07/2021   Multiple Vitamin (MULTIVITAMIN WITH MINERALS) TABS tablet Take 1 tablet by mouth daily. 30 tablet 0 02/07/2021   traZODone (DESYREL) 150 MG tablet Take 1 tablet (150 mg total) by mouth at bedtime. 30 tablet 0 02/07/2021   pantoprazole (PROTONIX) 40 MG tablet Take 1 tablet (40 mg total) by mouth daily. 30 tablet 0     Patient Stressors: Substance abuse    Patient Strengths: Ability for insight  Capable of independent living  Communication skills  Motivation for treatment/growth  Supportive family/friends   Treatment Modalities: Medication Management, Group therapy, Case management,  1 to 1 session with clinician,  Psychoeducation, Recreational therapy.   Physician Treatment Plan for Primary Diagnosis: Severe recurrent major depression without psychotic features (HCC) Long Term Goal(s): Improvement in symptoms so as ready for discharge   Short Term Goals: Ability to identify changes in lifestyle to reduce recurrence of condition will improve Ability to verbalize feelings will improve Ability to disclose and discuss suicidal ideas Ability to demonstrate self-control will improve Ability to identify and develop effective coping behaviors will improve Ability to maintain clinical measurements within normal limits will improve Compliance with prescribed medications will improve Ability to identify triggers associated with substance abuse/mental health issues will improve  Medication Management: Evaluate patient's response, side effects, and tolerance of medication regimen.  Therapeutic Interventions: 1 to 1 sessions, Unit Group sessions and Medication administration.  Evaluation of Outcomes: Progressing  Physician Treatment Plan for Secondary Diagnosis: Principal Problem:   Severe recurrent major depression without psychotic features (HCC) Active Problems:   HTN (hypertension)   Alcohol abuse   Weakness   Hyponatremia  Long Term Goal(s): Improvement in symptoms so as ready for discharge   Short Term Goals: Ability to identify changes in lifestyle to reduce recurrence of condition will improve Ability to verbalize feelings will improve Ability to disclose and discuss suicidal ideas Ability to demonstrate self-control will improve Ability to identify and develop effective coping behaviors will improve Ability to maintain clinical measurements within normal limits will improve Compliance with prescribed medications will improve Ability to identify triggers associated with substance abuse/mental health issues will improve     Medication Management: Evaluate patient's response, side effects, and  tolerance of medication regimen.  Therapeutic Interventions: 1 to 1 sessions, Unit Group sessions and Medication administration.  Evaluation of Outcomes: Progressing   RN Treatment Plan for Primary Diagnosis: Severe recurrent major depression without psychotic features (HCC) Long Term Goal(s): Knowledge of disease and therapeutic regimen to maintain health will improve  Short Term Goals: Ability to demonstrate self-control, Ability to participate in decision making will improve, Ability to verbalize feelings will improve, Ability to disclose and discuss suicidal ideas, and Ability to identify and develop effective coping behaviors will improve  Medication Management: RN will administer medications as ordered by provider, will assess and evaluate patient's response and provide education to patient for prescribed medication. RN will report any adverse and/or side effects to prescribing provider.  Therapeutic Interventions: 1 on 1 counseling sessions, Psychoeducation, Medication administration, Evaluate responses to treatment, Monitor vital signs and CBGs as ordered, Perform/monitor CIWA, COWS, AIMS and Fall Risk screenings as ordered, Perform wound care treatments as ordered.  Evaluation of Outcomes: Progressing   LCSW Treatment Plan for Primary Diagnosis: Severe recurrent major depression without psychotic features (HCC)  Long Term Goal(s): Safe transition to appropriate next level of care at discharge, Engage patient in therapeutic group addressing interpersonal concerns.  Short Term Goals: Engage patient in aftercare planning with referrals and resources, Increase social support, Increase ability to appropriately verbalize feelings, Increase emotional regulation, Facilitate acceptance of mental health diagnosis and concerns, and Increase skills for wellness and recovery  Therapeutic Interventions: Assess for all discharge needs, 1 to 1 time with Social worker, Explore available resources and  support systems, Assess for adequacy in community support network, Educate family and significant other(s) on suicide prevention, Complete Psychosocial Assessment, Interpersonal group therapy.  Evaluation of Outcomes: Progressing   Progress in Treatment: Attending groups: No. Participating in groups: No. Taking medication as prescribed: Yes. Toleration medication: Yes. Family/Significant other contact made: Yes, individual(s) contacted:  SPE completed with the patient.  Patient understands diagnosis: Yes. Discussing patient identified problems/goals with staff: Yes. Medical problems stabilized or resolved: Yes. Denies suicidal/homicidal ideation: Yes. Issues/concerns per patient self-inventory: No. Other: none  New problem(s) identified: No, Describe:  none  New Short Term/Long Term Goal(s): medication management for mood stabilization; elimination of SI thoughts; development of comprehensive mental wellness/sobriety plan.   Patient Goals: "Have not put a lot of thought into it:" Update 02/06/2021:  No additional goals identified since last treatment team. Update 02/11/2021: No changes at this time.    Discharge Plan or Barriers: No barriers identified at this time. Update 02/06/2021: Patient needs outpatient PT per recommendation from physical therapy. Patient interested in [facility].  Update 02/11/2021:  Patient has begun ECT treatment to treat continuous depression.  He has been referred to outpatient PT.  He reports plans to return to his home.    Reason for Continuation of Hospitalization: Depression   Estimated Length of Stay: 1-7 days   Scribe for Treatment Team: ANNA LIVERS, LCSW 02/11/2021 10:12 AM

## 2021-02-11 NOTE — Plan of Care (Signed)
Patient had ECT today. Patient verbalized that ECT is helping " little " with his depression.Patient denies SI,HI and AVH. PRN pain medication given x 2 for ongoing left hip pain. Patient ambulates with walker. No issues verbalized at this time. Appetite and energy level good. Encouraged and supplies provided for personal hygiene. Patient remains safe in the unit.

## 2021-02-11 NOTE — Progress Notes (Signed)
Patient calm and pleasant during assessment, denies SI/HI/AVH. Patient endorses anxiety and depression. Patient endorses chronic pain, check MAR. Patient observed interacting appropriately with staff and peers on the unit. Patient compliant with medication administration per MD orders. Pt being monitored Q 15 minutes for safety per unit protocol. Pt remains safe on the unit.   Patient became nauseas around 2030 and patient stated he had a headache. Patient thinks its probably from ECT today. Patient given PRN medications. Pt is asleep and without complaint at this time

## 2021-02-11 NOTE — Transfer of Care (Signed)
Immediate Anesthesia Transfer of Care Note  Patient: Chad [name]  Procedure(s) Performed: ECT TX  Patient Location: PACU  Anesthesia Type:General  Level of Consciousness: awake, drowsy and patient cooperative  Airway & Oxygen Therapy: Patient Spontanous Breathing and Patient connected to face mask oxygen  Post-op Assessment: Report given to RN and Post -op Vital signs reviewed and stable  Post vital signs: Reviewed and stable  Last Vitals:  Vitals Value Taken Time  BP    Temp    Pulse    Resp    SpO2      Last Pain:  Vitals:   02/11/21 1317  TempSrc:   PainSc: 0-No pain      Patients Stated Pain Goal: 4 (02/11/21 0059)  Complications: No notable events documented.
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
## 2021-02-12 MED ORDER — ACETAMINOPHEN 325 MG PO TABS
650.0000 mg | ORAL_TABLET | Freq: Four times a day (QID) | ORAL | Status: DC | PRN
  Filled 2021-02-12: qty 2

## 2021-02-12 NOTE — Progress Notes (Signed)
Patient calm and pleasant during assessment, denies SI/HI/AVH. Patient endorses anxiety and depression. Patient endorses chronic pain, check MAR. Patient observed interacting appropriately with staff and peers on the unit. Patient compliant with medication administration per MD orders. Pt being monitored Q 15 minutes for safety per unit protocol. Pt remains safe on the unit.  

## 2021-02-12 NOTE — Progress Notes (Signed)
Recreation Therapy Notes   Date: 02/12/2021  Time: 10:20 am   Location: Craft room    Behavioral response: N/A   Intervention Topic: Wellness   Discussion/Intervention: Patient did not attend group.   Clinical Observations/Feedback:  Patient did not attend group.   Jahnai Slingerland LRT/CTRS        Emri Sample 02/12/2021 12:48 PM

## 2021-02-12 NOTE — Plan of Care (Signed)
Patient verbalized that his depression getting little better. Denies SI,HI and AVH. No nausea or headache verbalized. Ambulates with walker. Visible in the milieu. Did not attend groups.Appetite and energy level good. ADLs maintained. Patient remains safe in the unit.Support and encouragement given.
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
## 2021-02-12 NOTE — Anesthesia Postprocedure Evaluation (Signed)
Anesthesia Post Note  Patient: Chad [name]  Procedure(s) Performed: ECT TX  Patient location during evaluation: PACU Anesthesia Type: General Level of consciousness: awake and alert Pain management: pain level controlled Vital Signs Assessment: post-procedure vital signs reviewed and stable Respiratory status: spontaneous breathing, nonlabored ventilation, respiratory function stable and patient connected to nasal cannula oxygen Cardiovascular status: blood pressure returned to baseline and stable Postop Assessment: no apparent nausea or vomiting Anesthetic complications: no   No notable events documented.   Last Vitals:  Vitals:   02/11/21 1448 02/11/21 1708  BP:  (!) 130/58  Pulse:  96  Resp:  16  Temp:  36.9 C  SpO2: 94%     Last Pain:  Vitals:   02/12/21 2011  TempSrc:   PainSc: Asleep                 Lenard Simmer

## 2021-02-12 NOTE — Group Note (Signed)
[facility] LCSW Group Therapy Note   Group Date: 02/12/2021 Start Time: 1300 End Time: 1400  Type of Therapy/Topic:  Group Therapy:  Feelings about Diagnosis  Participation Level:  Did Not Attend   Mood: N/A   Description of Group:    This group will allow patients to explore their thoughts and feelings about diagnoses they have received. Patients will be guided to explore their level of understanding and acceptance of these diagnoses. Facilitator will encourage patients to process their thoughts and feelings about the reactions of others to their diagnosis, and will guide patients in identifying ways to discuss their diagnosis with significant others in their lives. This group will be process-oriented, with patients participating in exploration of their own experiences as well as giving and receiving support and challenge from other group members.   Therapeutic Goals: 1. Patient will demonstrate understanding of diagnosis as evidence by identifying two or more symptoms of the disorder:  2. Patient will be able to express two feelings regarding the diagnosis 3. Patient will demonstrate ability to communicate their needs through discussion and/or role plays  Summary of Patient Progress: Patient did not attend group despite encouraged participation.    Therapeutic Modalities:   Cognitive Behavioral Therapy Brief Therapy Feelings Identification    Corky Crafts, [place]

## 2021-02-13 ENCOUNTER — Encounter: Payer: Self-pay | Admitting: Anesthesiology

## 2021-02-13 LAB — GLUCOSE, CAPILLARY: Glucose-Capillary: 107 mg/dL — ABNORMAL HIGH (ref 70–99)

## 2021-02-13 NOTE — Progress Notes (Signed)
Recreation Therapy Notes   Date: 02/13/2021  Time: 10:20 am   Location: Craft room   Behavioral response: Appropriate  Intervention Topic: Creative expressions   Discussion/Intervention:  Group content on today was focused on creative expressions. The group defined creative expressions and ways they use creative expressions. Individual identified other positive ways creative expressions can be used and why it is important to express yourself. Patients participated in the intervention "expressive painting", where they had a chance to creatively express themselves. Clinical Observations/Feedback: Patient came to group late due to unknown reasons. Individual was social with peers and staff while participating in the intervention.  Dannia Snook LRT/CTRS         Hailea Eaglin 02/13/2021 1:16 PM

## 2021-02-13 NOTE — Progress Notes (Signed)
D: Pt alert and oriented. Pt rates depression 6/10, and anxiety 6/10.  Pt reports energy level as normal and concentration as being good. Pt reports sleep last night as being good. Pt did receive medications for sleep and did find them helpful. Pt reports experiencing 8/10 chronic back pain, prn meds given. Pt denies experiencing any SI/HI, or AVH at this time.   Pt voiced some anxiety in regards to ECT this morning that later was canceled d/t unforseen circumstances.   A: Scheduled medications administered to pt, per MD orders. Support and encouragement provided. Frequent verbal contact made. Routine safety checks conducted q15 minutes.   R: No adverse drug reactions noted. Pt verbally contracts for safety at this time. Pt complaint with medications. Pt interacts well with others on the unit. Pt remains safe at this time. Will continue to monitor.
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
## 2021-02-13 NOTE — Group Note (Signed)
[facility] LCSW Group Therapy Note   Group Date: 02/13/2021 Start Time: 1300 End Time: 1400   Type of Therapy/Topic:  Group Therapy:  Emotion Regulation  Participation Level:  Did Not Attend   Mood:  Description of Group:    The purpose of this group is to assist patients in learning to regulate negative emotions and experience positive emotions. Patients will be guided to discuss ways in which they have been vulnerable to their negative emotions. These vulnerabilities will be juxtaposed with experiences of positive emotions or situations, and patients challenged to use positive emotions to combat negative ones. Special emphasis will be placed on coping with negative emotions in conflict situations, and patients will process healthy conflict resolution skills.  Therapeutic Goals: Patient will identify two positive emotions or experiences to reflect on in order to balance out negative emotions:  Patient will label two or more emotions that they find the most difficult to experience:  Patient will be able to demonstrate positive conflict resolution skills through discussion or role plays:   Summary of Patient Progress: X    Therapeutic Modalities:   Cognitive Behavioral Therapy Feelings Identification Dialectical Behavioral Therapy   Markeita Alicia J Micharl Helmes, LCSW 

## 2021-02-14 NOTE — Progress Notes (Signed)
Patient has remained isolative to room this shift. Denies SI, HI and AVH. Requested pain medicine once this shift. No other complaints

## 2021-02-14 NOTE — Group Note (Signed)

## 2021-02-14 NOTE — Progress Notes (Signed)
Recreation Therapy Notes   Date: 02/14/2021  Time: 10:00 am  Location: Court yard    Behavioral response: N/A   Intervention Topic: Social Skills   Discussion/Intervention: Patient did not attend group.   Clinical Observations/Feedback:  Patient invited to group; patient declined   Jeferson Boozer LRT/CTRS          Indigo Barbian 02/14/2021 12:09 PM

## 2021-02-14 NOTE — Progress Notes (Signed)
D: Pt alert and oriented. Pt rates depression 6/10, hopelessness 6/10, and anxiety 6/10. Pt reports energy level as normal and concentration as being good. Pt reports sleep last night as being fair. Pt did receive medications for sleep and did find them helpful. Pt reports experiencing 7/10 left hip pain, prn meds given. Pt denies experiencing any SI/HI, or AVH at this time.   A: Scheduled medications administered to pt, per MD orders. Support and encouragement provided. Frequent verbal contact made. Routine safety checks conducted q15 minutes.   R: No adverse drug reactions noted. Pt verbally contracts for safety at this time. Pt complaint with medications. Pt interacts well with others on the unit. Pt remains safe at this time. Will continue to monitor.

## 2021-02-14 NOTE — Plan of Care (Signed)
  Problem: Education: Goal: Knowledge of [facility] Education information/materials will improve Outcome: Progressing Goal: Emotional status will improve Outcome: Progressing Goal: Mental status will improve Outcome: Progressing Goal: Verbalization of understanding the information provided will improve Outcome: Progressing   Problem: Activity: Goal: Interest or engagement in activities will improve Outcome: Progressing Goal: Sleeping patterns will improve Outcome: Progressing   Problem: Coping: Goal: Ability to verbalize frustrations and anger appropriately will improve Outcome: Progressing Goal: Ability to demonstrate self-control will improve Outcome: Progressing   Problem: Health Behavior/Discharge Planning: Goal: Identification of resources available to assist in meeting health care needs will improve Outcome: Progressing Goal: Compliance with treatment plan for underlying cause of condition will improve Outcome: Progressing   Problem: Physical Regulation: Goal: Ability to maintain clinical measurements within normal limits will improve Outcome: Progressing   Problem: Safety: Goal: Periods of time without injury will increase Outcome: Progressing   Problem: Education: Goal: Utilization of techniques to improve thought processes will improve Outcome: Progressing Goal: Knowledge of the prescribed therapeutic regimen will improve Outcome: Progressing   Problem: Activity: Goal: Interest or engagement in leisure activities will improve Outcome: Progressing Goal: Imbalance in normal sleep/wake cycle will improve Outcome: Progressing   Problem: Coping: Goal: Coping ability will improve Outcome: Progressing Goal: Will verbalize feelings Outcome: Progressing   Problem: Health Behavior/Discharge Planning: Goal: Ability to make decisions will improve Outcome: Progressing Goal: Compliance with therapeutic regimen will improve Outcome: Progressing    Problem: Role Relationship: Goal: Will demonstrate positive changes in social behaviors and relationships Outcome: Progressing   Problem: Safety: Goal: Ability to disclose and discuss suicidal ideas will improve Outcome: Progressing Goal: Ability to identify and utilize support systems that promote safety will improve Outcome: Progressing   Problem: Self-Concept: Goal: Will verbalize positive feelings about self Outcome: Progressing Goal: Level of anxiety will decrease Outcome: Progressing   Problem: Education: Goal: Ability to state activities that reduce stress will improve Outcome: Progressing   Problem: Coping: Goal: Ability to identify and develop effective coping behavior will improve Outcome: Progressing   Problem: Self-Concept: Goal: Ability to identify factors that promote anxiety will improve Outcome: Progressing Goal: Level of anxiety will decrease Outcome: Progressing Goal: Ability to modify response to factors that promote anxiety will improve Outcome: Progressing   
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
## 2021-02-14 NOTE — Progress Notes (Signed)
Patient presents with flat affect but brightens on approach. Isolative to self and room, does come out for snacks and meds with minimal interaction with peers. Request prn for sleep, pain and anxiety with good relief. Ambulates with walker. In no distress. Denies SI, HI, AVH. Medication compliant. Remains safe on unit with q 15 min checks.

## 2021-02-14 NOTE — Plan of Care (Signed)
  Problem: Education: Goal: Emotional status will improve Outcome: Progressing Goal: Mental status will improve Outcome: Progressing Goal: Verbalization of understanding the information provided will improve Outcome: Progressing   Problem: Activity: Goal: Interest or engagement in activities will improve Outcome: Progressing   

## 2021-02-15 ENCOUNTER — Inpatient Hospital Stay: Payer: Medicare HMO

## 2021-02-15 ENCOUNTER — Encounter: Payer: Self-pay | Admitting: Psychiatry

## 2021-02-15 ENCOUNTER — Inpatient Hospital Stay: Payer: Medicare HMO | Admitting: Anesthesiology

## 2021-02-15 ENCOUNTER — Other Ambulatory Visit: Payer: Self-pay | Admitting: Psychiatry

## 2021-02-15 LAB — GLUCOSE, CAPILLARY: Glucose-Capillary: 88 mg/dL (ref 70–99)

## 2021-02-15 MED ORDER — METHOHEXITAL SODIUM 0.5 G IJ SOLR
INTRAMUSCULAR | Status: AC
  Filled 2021-02-15: qty 500

## 2021-02-15 MED ORDER — KETOROLAC TROMETHAMINE 30 MG/ML IJ SOLN
30.0000 mg | Freq: Once | INTRAMUSCULAR | Status: AC
  Administered 2021-02-15: 30 mg via INTRAVENOUS

## 2021-02-15 MED ORDER — GLYCOPYRROLATE 0.2 MG/ML IJ SOLN
0.1000 mg | Freq: Once | INTRAMUSCULAR | Status: AC
Start: 2021-02-15 — End: 2021-02-15
  Administered 2021-02-15: 0.1 mg via INTRAVENOUS

## 2021-02-15 MED ORDER — SUCCINYLCHOLINE CHLORIDE 200 MG/10ML IV SOSY
PREFILLED_SYRINGE | INTRAVENOUS | Status: DC | PRN
  Administered 2021-02-15: 120 mg via INTRAVENOUS

## 2021-02-15 MED ORDER — SODIUM CHLORIDE 0.9 % IV SOLN: 500.0000 mL | Freq: Once | INTRAVENOUS | Status: AC

## 2021-02-15 MED ORDER — ONDANSETRON HCL 4 MG/2ML IJ SOLN: 4.0000 mg | Freq: Once | INTRAMUSCULAR | Status: DC | PRN

## 2021-02-15 MED ORDER — ACETAMINOPHEN 325 MG PO TABS: 650.0000 mg | ORAL_TABLET | Freq: Once | ORAL | Status: DC | PRN

## 2021-02-15 MED ORDER — ACETAMINOPHEN 160 MG/5ML PO SOLN
325.0000 mg | ORAL | Status: DC | PRN
  Filled 2021-02-15: qty 20.3

## 2021-02-15 MED ORDER — KETOROLAC TROMETHAMINE 30 MG/ML IJ SOLN
INTRAMUSCULAR | Status: AC
  Filled 2021-02-15: qty 1

## 2021-02-15 MED ORDER — GLYCOPYRROLATE 0.2 MG/ML IJ SOLN
INTRAMUSCULAR | Status: AC
  Filled 2021-02-15: qty 1

## 2021-02-15 MED ORDER — METHOHEXITAL SODIUM 100 MG/10ML IV SOSY
PREFILLED_SYRINGE | INTRAVENOUS | Status: DC | PRN
  Administered 2021-02-15: 80 mg via INTRAVENOUS

## 2021-02-15 NOTE — Anesthesia Postprocedure Evaluation (Signed)
Anesthesia Post Note  Patient: Chad [name]  Procedure(s) Performed: ECT TX  Patient location during evaluation: PACU Anesthesia Type: General Level of consciousness: awake and alert, oriented and patient cooperative Pain management: pain level controlled Vital Signs Assessment: post-procedure vital signs reviewed and stable Respiratory status: spontaneous breathing, nonlabored ventilation and respiratory function stable Cardiovascular status: blood pressure returned to baseline and stable Postop Assessment: adequate PO intake Anesthetic complications: no   No notable events documented.   Last Vitals:  Vitals:   02/15/21 1325 02/15/21 1330  BP: (!) 161/87 (!) 142/78  Pulse: (!) 108 88  Resp: 17 15  Temp: 36.6 C   SpO2: 98% 98%    Last Pain:  Vitals:   02/15/21 1330  TempSrc:   PainSc: 0-No pain                 Reed Breech

## 2021-02-15 NOTE — Progress Notes (Signed)
Patient back from ECT. Had lunch. Patient states " feels better than last time.I don't have a headache today." Patient stated that his depression getting " little " better. Denies SI,HI and AVH. Appetite and energy level good. Ambulates with walker. Support and encouragement given.

## 2021-02-15 NOTE — Progress Notes (Signed)
Patient insisted on having pain medicine this am. Writer explained to vet that the order is nothing after midnight, patient stated he had his pills on Tuesday before the surgery as well as coffee prior to procedure and states it was done anyway. Pt states he wants his pain med whether procedure is done or not.

## 2021-02-15 NOTE — Progress Notes (Signed)
Recreation Therapy Notes  Date: 02/15/2021  Time: 9:45 am   Location: Craft room   Behavioral response: Appropriate  Intervention Topic: Time Management     Discussion/Intervention:  Group content today was focused on time management. The group defined time management and identified healthy ways to manage time. Individuals expressed how much of the 24 hours they use in a day. Patients expressed how much time they use just for themselves personally. The group expressed how they have managed their time in the past. Individuals participated in the intervention "Managing Life" where they had a chance to see how much of the 24 hours they use and where it goes. Clinical Observations/Feedback: Patient came to group and was focused on what peers and staff had to say about time management. Individual was social with peers and staff while participating in the intervention.  Dorrien Grunder LRT/CTRS         Eilee Schader 02/15/2021 10:50 AM
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
## 2021-02-15 NOTE — Procedures (Signed)
ECT SERVICES Physician's Interval Evaluation & Treatment Note  Patient Identification: Chad [name] MRN:  [id number] Date of Evaluation:  02/15/2021 TX #: [id number]  MADRS:   MMSE:   P.E. Findings:  No change to physical exam  Psychiatric Interval Note:  Patient seems a little brighter and more energetic to me.  Subjective:  Patient is a 70 y.o. male seen for evaluation for Electroconvulsive Therapy. He does not have a clear awareness of any improvement  Treatment Summary:   [x]   Right Unilateral             []  Bilateral   % Energy : 0.3 ms 55%   Impedance: 2550 ohms  Seizure Energy Index: 1683 V squared  Postictal Suppression Index: 53%  Seizure Concordance Index: 86%  Medications  Pre Shock: Toradol 30 mg Brevital 80 mg succinylcholine 100 mg  Post Shock:    Seizure Duration: 24 seconds EMG 35 seconds EEG   Comments: Next treatment Monday  Lungs:  [x]   Clear to auscultation               []  Other:   Heart:    [x]   Regular rhythm             []  irregular rhythm    [x]   Previous H&P reviewed, patient examined and there are NO CHANGES                 []   Previous H&P reviewed, patient examined and there are changes noted.   , MD 10/28/20225:33 PM

## 2021-02-15 NOTE — Anesthesia Preprocedure Evaluation (Addendum)
Anesthesia Evaluation  Patient identified by MRN, date of birth, ID band Patient awake    Reviewed: Allergy & Precautions, NPO status , Patient's Chart, lab work & pertinent test results  History of Anesthesia Complications Negative for: history of anesthetic complications  Airway Mallampati: IV   Neck ROM: Full    Dental   Missing molars:   Pulmonary Current Smoker (1/2 ppd) and Patient abstained from smoking.,    Pulmonary exam normal breath sounds clear to auscultation       Cardiovascular hypertension, Normal cardiovascular exam Rhythm:Regular Rate:Normal     Neuro/Psych  Headaches, PSYCHIATRIC DISORDERS Anxiety Depression Alcohol use disorder; chronic pain    GI/Hepatic GERD  ,(+) Hepatitis -, C  Endo/Other  negative endocrine ROS  Renal/GU negative Renal ROS     Musculoskeletal   Abdominal   Peds  Hematology negative hematology ROS (+)   Anesthesia Other Findings Failure to thrive  Reproductive/Obstetrics                            Anesthesia Physical Anesthesia Plan  ASA: 3  Anesthesia Plan: General   Post-op Pain Management:    Induction: Intravenous  PONV Risk Score and Plan: 1 and TIVA and Treatment may vary due to age or medical condition  Airway Management Planned: Natural Airway  Additional Equipment:   Intra-op Plan:   Post-operative Plan:   Informed Consent: I have reviewed the patients History and Physical, chart, labs and discussed the procedure including the risks, benefits and alternatives for the proposed anesthesia with the patient or authorized representative who has indicated his/her understanding and acceptance.       Plan Discussed with: CRNA  Anesthesia Plan Comments:        Anesthesia Quick Evaluation

## 2021-02-15 NOTE — Transfer of Care (Signed)
Immediate Anesthesia Transfer of Care Note  Patient: Demetris Capell  Procedure(s) Performed: ECT TX  Patient Location: PACU  Anesthesia Type:General  Level of Consciousness: drowsy  Airway & Oxygen Therapy: Patient Spontanous Breathing and Patient connected to face mask oxygen  Post-op Assessment: Report given to RN and Post -op Vital signs reviewed and stable  Post vital signs: Reviewed and stable  Last Vitals:  Vitals Value Taken Time  BP 161/87 02/15/21 1326  Temp    Pulse 96 02/15/21 1327  Resp 12 02/15/21 1327  SpO2 97 % 02/15/21 1327  Vitals shown include unvalidated device data.  Last Pain:  Vitals:   02/15/21 1106  TempSrc:   PainSc: 0-No pain      Patients Stated Pain Goal: 0 (02/14/21 0802)  Complications: No notable events documented.

## 2021-02-15 NOTE — Anesthesia Procedure Notes (Signed)
Procedure Name: General with mask airway Date/Time: 02/15/2021 1:14 PM Performed by: Elmarie Mainland, CRNA Pre-anesthesia Checklist: Patient identified, Emergency Drugs available, Suction available, Patient being monitored and Timeout performed Patient Re-evaluated:Patient Re-evaluated prior to induction Oxygen Delivery Method: Circle system utilized Preoxygenation: Pre-oxygenation with 100% oxygen Induction Type: IV induction Ventilation: Mask ventilation throughout procedure
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
## 2021-02-16 DIAGNOSIS — F332 Major depressive disorder, recurrent severe without psychotic features: Secondary | ICD-10-CM | POA: Diagnosis not present

## 2021-02-16 NOTE — Group Note (Signed)
LCSW Group Therapy Note  Group Date: 02/16/2021 Start Time: 1315 End Time: 1415   Type of Therapy and Topic:  Group Therapy - Healthy vs Unhealthy Coping Skills  Participation Level:  Did Not Attend   Description of Group The focus of this group was to determine what unhealthy coping techniques typically are used by group members and what healthy coping techniques would be helpful in coping with various problems. Patients were guided in becoming aware of the differences between healthy and unhealthy coping techniques. Patients were asked to identify 2-3 healthy coping skills they would like to learn to use more effectively.  Therapeutic Goals Patients learned that coping is what human beings do all day long to deal with various situations in their lives Patients defined and discussed healthy vs unhealthy coping techniques Patients identified their preferred coping techniques and identified whether these were healthy or unhealthy Patients determined 2-3 healthy coping skills they would like to become more familiar with and use more often. Patients provided support and ideas to each other   Summary of Patient Progress: Patient did not attend group despite encouraged participation.    Therapeutic Modalities Cognitive Behavioral Therapy Motivational Interviewing  Eliab Closson K Reathel Turi, LCSWA 02/16/2021  4:46 PM   

## 2021-02-16 NOTE — Progress Notes (Signed)
Patient denies SI/HI/AVH. Patient ambulates with walker. Patient requested and was given pain medicine for a pain rating 9/10. Patient occasionally seen walking around the unit. Q15 minute safety checks maintained. Patient remains safe on the unit at this time.

## 2021-02-16 NOTE — BH IP Treatment Plan (Signed)
Interdisciplinary Treatment and Diagnostic Plan Update  02/16/2021 Time of Session: 10:30AM Taevion Sikora MRN: [id number]  Principal Diagnosis: Severe recurrent major depression without psychotic features [facility])  Secondary Diagnoses: Principal Problem:   Severe recurrent major depression without psychotic features (HCC) Active Problems:   HTN (hypertension)   Alcohol abuse   Weakness   Hyponatremia   Current Medications:  Current Facility-Administered Medications  Medication Dose Route Frequency Provider Last Rate Last Admin   acamprosate (CAMPRAL) tablet 666 mg  666 mg Oral TID Jesse Sans, MD   666 mg at 02/16/21 6314   acetaminophen (TYLENOL) tablet 650 mg  650 mg Oral Once PRN Reed Breech, MD       Or   acetaminophen (TYLENOL) 160 MG/5ML solution 325-650 mg  325-650 mg Oral Q4H PRN Reed Breech, MD       acetaminophen (TYLENOL) tablet 650 mg  650 mg Oral Q6H PRN Jesse Sans, MD       alum & mag hydroxide-simeth (MAALOX/MYLANTA) 200-200-20 MG/5ML suspension 30 mL  30 mL Oral Q4H PRN Clapacs, Jackquline Denmark, MD       citalopram (CELEXA) tablet 20 mg  20 mg Oral Daily Jesse Sans, MD   20 mg at 02/16/21 9702   feeding supplement (ENSURE ENLIVE / ENSURE PLUS) liquid 237 mL  237 mL Oral TID BM Clapacs, John T, MD   237 mL at 02/16/21 1026   folic acid (FOLVITE) tablet 1 mg  1 mg Oral Daily Clapacs, Jackquline Denmark, MD   1 mg at 02/16/21 6378   gabapentin (NEURONTIN) capsule 300 mg  300 mg Oral TID Clapacs, Jackquline Denmark, MD   300 mg at 02/16/21 0751   HYDROcodone-acetaminophen (NORCO/VICODIN) 5-325 MG per tablet 1-2 tablet  1-2 tablet Oral Q6H PRN Jesse Sans, MD   2 tablet at 02/16/21 0531   hydrOXYzine (ATARAX/VISTARIL) tablet 25 mg  25 mg Oral TID PRN Clapacs, Jackquline Denmark, MD   25 mg at 02/14/21 2038   lidocaine (LIDODERM) 5 % 1 patch  1 patch Transdermal Q24H Jesse Sans, MD   1 patch at 02/13/21 2014   magnesium hydroxide (MILK OF MAGNESIA) suspension 30 mL  30 mL Oral Daily  PRN Clapacs, Jackquline Denmark, MD   30 mL at 02/01/21 1433   multivitamin with minerals tablet 1 tablet  1 tablet Oral Daily Clapacs, Jackquline Denmark, MD   1 tablet at 02/16/21 0752   ondansetron (ZOFRAN) injection 4 mg  4 mg Intravenous Once PRN Reed Breech, MD       ondansetron [facility]) tablet 8 mg  8 mg Oral Q8H PRN Jesse Sans, MD   8 mg at 02/11/21 2025   pantoprazole (PROTONIX) EC tablet 40 mg  40 mg Oral Daily Clapacs, Jackquline Denmark, MD   40 mg at 02/16/21 5885   thiamine tablet 100 mg  100 mg Oral Daily Jesse Sans, MD   100 mg at 02/16/21 0751   traZODone (DESYREL) tablet 50 mg  50 mg Oral QHS PRN Clapacs, Jackquline Denmark, MD   50 mg at 02/14/21 2038   PTA Medications: Medications Prior to Admission  Medication Sig Dispense Refill Last Dose   acetaminophen (TYLENOL) 325 MG tablet Take 2 tablets (650 mg total) by mouth every 6 (six) hours as needed for mild pain (pain score 1-3 or temp > 100.5).   02/07/2021   citalopram (CELEXA) 10 MG tablet Take 1 tablet (10 mg total) by mouth daily.   02/07/2021  feeding supplement (ENSURE ENLIVE / ENSURE PLUS) LIQD Take 237 mLs by mouth 3 (three) times daily between meals. 237 mL 12 02/07/2021   folic acid (FOLVITE) 1 MG tablet Take 1 tablet (1 mg total) by mouth daily. 30 tablet 0 02/07/2021   gabapentin (NEURONTIN) 300 MG capsule Take 1 capsule (300 mg total) by mouth 3 (three) times daily. 90 capsule 0 02/07/2021   HYDROcodone-acetaminophen (NORCO/VICODIN) 5-325 MG tablet Take 1-2 tablets by mouth every 6 (six) hours as needed for moderate pain or severe pain. 8 tablet 0 02/07/2021   hydrOXYzine (ATARAX/VISTARIL) 25 MG tablet Take 1 tablet (25 mg total) by mouth 3 (three) times daily as needed for anxiety. 30 tablet 0 02/07/2021   Multiple Vitamin (MULTIVITAMIN WITH MINERALS) TABS tablet Take 1 tablet by mouth daily. 30 tablet 0 02/07/2021   traZODone (DESYREL) 150 MG tablet Take 1 tablet (150 mg total) by mouth at bedtime. 30 tablet 0 02/07/2021   pantoprazole  (PROTONIX) 40 MG tablet Take 1 tablet (40 mg total) by mouth daily. 30 tablet 0     Patient Stressors: Substance abuse    Patient Strengths: Ability for insight  Capable of independent living  Communication skills  Motivation for treatment/growth  Supportive family/friends   Treatment Modalities: Medication Management, Group therapy, Case management,  1 to 1 session with clinician, Psychoeducation, Recreational therapy.   Physician Treatment Plan for Primary Diagnosis: Severe recurrent major depression without psychotic features (HCC) Long Term Goal(s): Improvement in symptoms so as ready for discharge   Short Term Goals: Ability to identify changes in lifestyle to reduce recurrence of condition will improve Ability to verbalize feelings will improve Ability to disclose and discuss suicidal ideas Ability to demonstrate self-control will improve Ability to identify and develop effective coping behaviors will improve Ability to maintain clinical measurements within normal limits will improve Compliance with prescribed medications will improve Ability to identify triggers associated with substance abuse/mental health issues will improve  Medication Management: Evaluate patient's response, side effects, and tolerance of medication regimen.  Therapeutic Interventions: 1 to 1 sessions, Unit Group sessions and Medication administration.  Evaluation of Outcomes: Progressing  Physician Treatment Plan for Secondary Diagnosis: Principal Problem:   Severe recurrent major depression without psychotic features (HCC) Active Problems:   HTN (hypertension)   Alcohol abuse   Weakness   Hyponatremia  Long Term Goal(s): Improvement in symptoms so as ready for discharge   Short Term Goals: Ability to identify changes in lifestyle to reduce recurrence of condition will improve Ability to verbalize feelings will improve Ability to disclose and discuss suicidal ideas Ability to demonstrate  self-control will improve Ability to identify and develop effective coping behaviors will improve Ability to maintain clinical measurements within normal limits will improve Compliance with prescribed medications will improve Ability to identify triggers associated with substance abuse/mental health issues will improve     Medication Management: Evaluate patient's response, side effects, and tolerance of medication regimen.  Therapeutic Interventions: 1 to 1 sessions, Unit Group sessions and Medication administration.  Evaluation of Outcomes: Progressing   RN Treatment Plan for Primary Diagnosis: Severe recurrent major depression without psychotic features (HCC) Long Term Goal(s): Knowledge of disease and therapeutic regimen to maintain health will improve  Short Term Goals: Ability to demonstrate self-control, Ability to participate in decision making will improve, Ability to verbalize feelings will improve, Ability to disclose and discuss suicidal ideas, and Ability to identify and develop effective coping behaviors will improve  Medication Management: RN will administer medications  as ordered by provider, will assess and evaluate patient's response and provide education to patient for prescribed medication. RN will report any adverse and/or side effects to prescribing provider.  Therapeutic Interventions: 1 on 1 counseling sessions, Psychoeducation, Medication administration, Evaluate responses to treatment, Monitor vital signs and CBGs as ordered, Perform/monitor CIWA, COWS, AIMS and Fall Risk screenings as ordered, Perform wound care treatments as ordered.  Evaluation of Outcomes: Progressing   LCSW Treatment Plan for Primary Diagnosis: Severe recurrent major depression without psychotic features (HCC) Long Term Goal(s): Safe transition to appropriate next level of care at discharge, Engage patient in therapeutic group addressing interpersonal concerns.  Short Term Goals: Engage  patient in aftercare planning with referrals and resources, Increase social support, Increase ability to appropriately verbalize feelings, Increase emotional regulation, Facilitate acceptance of mental health diagnosis and concerns, and Increase skills for wellness and recovery  Therapeutic Interventions: Assess for all discharge needs, 1 to 1 time with Social worker, Explore available resources and support systems, Assess for adequacy in community support network, Educate family and significant other(s) on suicide prevention, Complete Psychosocial Assessment, Interpersonal group therapy.  Evaluation of Outcomes: Progressing   Progress in Treatment: Attending groups: No. Participating in groups: No. Taking medication as prescribed: Yes. Toleration medication: Yes. Family/Significant other contact made: Yes, individual(s) contacted:  SPE completed with the patient. Patient understands diagnosis: Yes. Discussing patient identified problems/goals with staff: Yes. Medical problems stabilized or resolved: Yes. Denies suicidal/homicidal ideation: Yes. Issues/concerns per patient self-inventory: No. Other: none.  New problem(s) identified: No, Describe:  No new problems identified at this time.   New Short Term/Long Term Goal(s): medication management for mood stabilization; elimination of SI thoughts; development of comprehensive mental wellness/sobriety plan.   Patient Goals: "Have not put a lot of thought into it:" Update 02/06/2021:  No additional goals identified since last treatment team. Update 02/11/2021: No changes at this time. Update 02/16/2021: No changes at this time.   Discharge Plan or Barriers: No barriers identified at this time. Update 02/06/2021: Patient needs outpatient PT per recommendation from physical therapy. Patient interested in [facility].  Update 02/11/2021:  Patient has begun ECT treatment to treat continuous depression.  He has been referred to outpatient  PT.  He reports plans to return to his home. Update 02/16/2021: No changes at this time.   Reason for Continuation of Hospitalization: Depression  Estimated Length of Stay: TBD   Scribe for Treatment Team: Marletta Lor 02/16/2021 10:33 AM

## 2021-02-16 NOTE — Progress Notes (Signed)
Patient alert and oriented this day. Present on Milieu off and on. Denies SI/HI/AH/VH. Reports feeling depressed and anxious but some improvement. Rates 5/10. Reports ongoing pain 9/10. Received pain medication this am around 535. Denies any relief. Takes all medication as prescribed. Present on milieu and interacting appropriately with staff and peers. No adverse reactions to medications noted. Cont Q 15 minute check for safety.
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
## 2021-02-16 NOTE — Plan of Care (Signed)
  Problem: Education: Goal: Knowledge of [facility] Education information/materials will improve Outcome: Progressing Goal: Emotional status will improve Outcome: Progressing Goal: Mental status will improve Outcome: Progressing Goal: Verbalization of understanding the information provided will improve Outcome: Progressing   Problem: Activity: Goal: Interest or engagement in activities will improve Outcome: Progressing Goal: Sleeping patterns will improve Outcome: Progressing   Problem: Coping: Goal: Ability to verbalize frustrations and anger appropriately will improve Outcome: Progressing Goal: Ability to demonstrate self-control will improve Outcome: Progressing   Problem: Health Behavior/Discharge Planning: Goal: Identification of resources available to assist in meeting health care needs will improve Outcome: Progressing Goal: Compliance with treatment plan for underlying cause of condition will improve Outcome: Progressing   Problem: Physical Regulation: Goal: Ability to maintain clinical measurements within normal limits will improve Outcome: Progressing   Problem: Education: Goal: Utilization of techniques to improve thought processes will improve Outcome: Progressing Goal: Knowledge of the prescribed therapeutic regimen will improve Outcome: Progressing   Problem: Safety: Goal: Periods of time without injury will increase Outcome: Progressing   Problem: Activity: Goal: Interest or engagement in leisure activities will improve Outcome: Progressing Goal: Imbalance in normal sleep/wake cycle will improve Outcome: Progressing   Problem: Coping: Goal: Coping ability will improve Outcome: Progressing Goal: Will verbalize feelings Outcome: Progressing

## 2021-02-17 DIAGNOSIS — F332 Major depressive disorder, recurrent severe without psychotic features: Secondary | ICD-10-CM | POA: Diagnosis not present

## 2021-02-17 MED ORDER — TRAZODONE HCL 100 MG PO TABS
100.0000 mg | ORAL_TABLET | Freq: Every day | ORAL | Status: DC
  Administered 2021-02-17 – 2021-02-26 (×10): 100 mg via ORAL
  Filled 2021-02-17 (×10): qty 1

## 2021-02-17 NOTE — Progress Notes (Addendum)
Patient awake and alert this day and present on the milieu. Interacting with staff and peers appropriately and participating in groups. Denies SI/HI/AH/VH. Endorsees pain to R hip and back . Rates pain 8/10. States pain goal 1/10. Rates depression 9/10 and anxiety 9/10. Needs some prompting for scheduled meds but frequently ask for pain meds in between scheduled time. Patients pain is not relieved and reports pain as a 8/10. Lowest score today is a 7/10. No adverse reaction to medications noted. Patient supported emotionally and encouraged to verbalize needs. Will cont Q15 minute check for safety.

## 2021-02-17 NOTE — Progress Notes (Signed)
Pain meds given. Rates 9/10

## 2021-02-17 NOTE — Group Note (Signed)
LCSW Group Therapy Note  Group Date: 02/17/2021 Start Time: 1310 End Time: 1410   Type of Therapy and Topic:  Group Therapy - How To Cope with Nervousness about Discharge   Participation Level:  Did Not Attend   Description of Group This process group involved identification of patients' feelings about discharge. Some of them are scheduled to be discharged soon, while others are new admissions, but each of them was asked to share thoughts and feelings surrounding discharge from the hospital. One common theme was that they are excited at the prospect of going home, while another was that many of them are apprehensive about sharing why they were hospitalized. Patients were given the opportunity to discuss these feelings with their peers in preparation for discharge.  Therapeutic Goals  Patient will identify their overall feelings about pending discharge. Patient will think about how they might proactively address issues that they believe will once again arise once they get home (i.e. with parents). Patients will participate in discussion about having hope for change.   Summary of Patient Progress: Patient did not attend group despite encouraged participation.    Therapeutic Modalities Cognitive Behavioral Therapy   Marletta Lor 02/17/2021  6:31 PM

## 2021-02-17 NOTE — Plan of Care (Signed)
Patient reminded o NPO status or ECT starting at [facility] and verbalized understanding Problem: Education: Goal: Knowledge of [facility] Education information/materials will improve Outcome: Progressing Goal: Emotional status will improve Outcome: Progressing Goal: Mental status will improve Outcome: Progressing Goal: Verbalization of understanding the information provided will improve Outcome: Progressing   Problem: Activity: Goal: Interest or engagement in activities will improve Outcome: Progressing Goal: Sleeping patterns will improve Outcome: Progressing   Problem: Coping: Goal: Ability to verbalize frustrations and anger appropriately will improve Outcome: Progressing Goal: Ability to demonstrate self-control will improve Outcome: Progressing

## 2021-02-17 NOTE — Progress Notes (Signed)
Patient alert and oriented x 4, affect is blunted thoughts are organized denies SI/HI/AVH  interacting appropriately with peers and staff. He complained of pain constantly  and was medicated as needed, 15 minutes safety checks maintained. 
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
## 2021-02-18 ENCOUNTER — Inpatient Hospital Stay: Payer: Medicare HMO | Admitting: Certified Registered Nurse Anesthetist

## 2021-02-18 ENCOUNTER — Other Ambulatory Visit: Payer: Self-pay | Admitting: Psychiatry

## 2021-02-18 ENCOUNTER — Encounter: Payer: Self-pay | Admitting: Psychiatry

## 2021-02-18 ENCOUNTER — Inpatient Hospital Stay: Payer: Medicare HMO

## 2021-02-18 LAB — GLUCOSE, CAPILLARY: Glucose-Capillary: 99 mg/dL (ref 70–99)

## 2021-02-18 MED ORDER — METHOHEXITAL SODIUM 0.5 G IJ SOLR
INTRAMUSCULAR | Status: AC
  Filled 2021-02-18: qty 500

## 2021-02-18 MED ORDER — GLYCOPYRROLATE 0.2 MG/ML IJ SOLN: 0.1000 mg | Freq: Once | INTRAMUSCULAR | Status: AC

## 2021-02-18 MED ORDER — SUCCINYLCHOLINE CHLORIDE 200 MG/10ML IV SOSY
PREFILLED_SYRINGE | INTRAVENOUS | Status: DC | PRN
  Administered 2021-02-18: 120 mg via INTRAVENOUS

## 2021-02-18 MED ORDER — SODIUM CHLORIDE 0.9 % IV SOLN
500.0000 mL | Freq: Once | INTRAVENOUS | Status: AC
Start: 2021-02-18 — End: 2021-02-18
  Administered 2021-02-18: 500 mL via INTRAVENOUS

## 2021-02-18 MED ORDER — KETOROLAC TROMETHAMINE 30 MG/ML IJ SOLN
INTRAMUSCULAR | Status: AC
  Administered 2021-02-18: 30 mg via INTRAVENOUS
  Filled 2021-02-18: qty 1

## 2021-02-18 MED ORDER — METHOHEXITAL SODIUM 100 MG/10ML IV SOSY
PREFILLED_SYRINGE | INTRAVENOUS | Status: DC | PRN
  Administered 2021-02-18: 80 mg via INTRAVENOUS

## 2021-02-18 MED ORDER — GLYCOPYRROLATE 0.2 MG/ML IJ SOLN
INTRAMUSCULAR | Status: AC
  Administered 2021-02-18: 0.1 mg via INTRAVENOUS
  Filled 2021-02-18: qty 1

## 2021-02-18 MED ORDER — SODIUM CHLORIDE 0.9 % IV SOLN: INTRAVENOUS | Status: DC | PRN

## 2021-02-18 MED ORDER — SUCCINYLCHOLINE CHLORIDE 200 MG/10ML IV SOSY
PREFILLED_SYRINGE | INTRAVENOUS | Status: AC
  Filled 2021-02-18: qty 10

## 2021-02-18 MED ORDER — KETOROLAC TROMETHAMINE 30 MG/ML IJ SOLN: 30.0000 mg | Freq: Once | INTRAMUSCULAR | Status: AC

## 2021-02-18 NOTE — Progress Notes (Signed)
Recreation Therapy Notes   Date: 02/18/2021  Time: 10:00 am     Location: Craft room    Behavioral response: N/A   Intervention Topic: Stress Management    Discussion/Intervention: Patient did not attend group.   Clinical Observations/Feedback:  Patient did not attend group.   Samyrah Bruster LRT/CTRS        Eulla Kochanowski 02/18/2021 12:12 PM

## 2021-02-18 NOTE — Anesthesia Preprocedure Evaluation (Signed)
Anesthesia Evaluation  Patient identified by MRN, date of birth, ID band Patient awake    Reviewed: Allergy & Precautions, NPO status , Patient's Chart, lab work & pertinent test results  History of Anesthesia Complications Negative for: history of anesthetic complications  Airway Mallampati: II  TM Distance: >3 FB Neck ROM: Full    Dental  (+) Poor Dentition   Pulmonary neg sleep apnea, neg COPD, Current Smoker,    breath sounds clear to auscultation- rhonchi (-) wheezing      Cardiovascular hypertension, Pt. on medications (-) CAD, (-) Past MI, (-) Cardiac Stents and (-) CABG  Rhythm:Regular Rate:Normal - Systolic murmurs and - Diastolic murmurs    Neuro/Psych  Headaches, neg Seizures PSYCHIATRIC DISORDERS Anxiety Depression    GI/Hepatic Neg liver ROS, GERD  ,  Endo/Other  negative endocrine ROSneg diabetes  Renal/GU negative Renal ROS     Musculoskeletal  (+) Arthritis ,   Abdominal (+) - obese,   Peds  Hematology negative hematology ROS (+)   Anesthesia Other Findings   Reproductive/Obstetrics                             Anesthesia Physical Anesthesia Plan  ASA: 3  Anesthesia Plan: General   Post-op Pain Management:    Induction: Intravenous  PONV Risk Score and Plan: 0 and Ondansetron  Airway Management Planned: Mask  Additional Equipment:   Intra-op Plan:   Post-operative Plan:   Informed Consent: I have reviewed the patients History and Physical, chart, labs and discussed the procedure including the risks, benefits and alternatives for the proposed anesthesia with the patient or authorized representative who has indicated his/her understanding and acceptance.     Dental advisory given  Plan Discussed with: CRNA and Anesthesiologist  Anesthesia Plan Comments:         Anesthesia Quick Evaluation

## 2021-02-18 NOTE — Plan of Care (Signed)
?  Problem: Education: ?Goal: Knowledge of [facility] Education information/materials will improve ?Outcome: Progressing ?Goal: Emotional status will improve ?Outcome: Progressing ?Goal: Mental status will improve ?Outcome: Progressing ?Goal: Verbalization of understanding the information provided will improve ?Outcome: Progressing ?  ?Problem: Activity: ?Goal: Interest or engagement in activities will improve ?Outcome: Progressing ?Goal: Sleeping patterns will improve ?Outcome: Progressing ?  ?Problem: Coping: ?Goal: Ability to verbalize frustrations and anger appropriately will improve ?Outcome: Progressing ?Goal: Ability to demonstrate self-control will improve ?Outcome: Progressing ?  ?Problem: Health Behavior/Discharge Planning: ?Goal: Identification of resources available to assist in meeting health care needs will improve ?Outcome: Progressing ?Goal: Compliance with treatment plan for underlying cause of condition will improve ?Outcome: Progressing ?  ?Problem: Physical Regulation: ?Goal: Ability to maintain clinical measurements within normal limits will improve ?Outcome: Progressing ?  ?Problem: Safety: ?Goal: Periods of time without injury will increase ?Outcome: Progressing ?  ?Problem: Education: ?Goal: Utilization of techniques to improve thought processes will improve ?Outcome: Progressing ?Goal: Knowledge of the prescribed therapeutic regimen will improve ?Outcome: Progressing ?  ?Problem: Activity: ?Goal: Interest or engagement in leisure activities will improve ?Outcome: Progressing ?Goal: Imbalance in normal sleep/wake cycle will improve ?Outcome: Progressing ?  ?Problem: Coping: ?Goal: Coping ability will improve ?Outcome: Progressing ?Goal: Will verbalize feelings ?Outcome: Progressing ?  ?Problem: Health Behavior/Discharge Planning: ?Goal: Ability to make decisions will improve ?Outcome: Progressing ?Goal: Compliance with therapeutic regimen will improve ?Outcome: Progressing ?   ?Problem: Role Relationship: ?Goal: Will demonstrate positive changes in social behaviors and relationships ?Outcome: Progressing ?  ?Problem: Safety: ?Goal: Ability to disclose and discuss suicidal ideas will improve ?Outcome: Progressing ?Goal: Ability to identify and utilize support systems that promote safety will improve ?Outcome: Progressing ?  ?

## 2021-02-18 NOTE — Progress Notes (Addendum)
Patient remains on NPO status. Protonix to be given per protocol. Daily meds and  TID second dose to be given after ECT. Blood sugar assesed by night nurese and value WNL. No s/s of distress. Will cont to monitor for safety.

## 2021-02-18 NOTE — Anesthesia Procedure Notes (Signed)
Procedure Name: General with mask airway Date/Time: 02/18/2021 12:50 PM Performed by: Hezzie Bump, CRNA Pre-anesthesia Checklist: Patient identified, Emergency Drugs available, Suction available and Patient being monitored Patient Re-evaluated:Patient Re-evaluated prior to induction Oxygen Delivery Method: Circle system utilized Preoxygenation: Pre-oxygenation with 100% oxygen Induction Type: IV induction Ventilation: Mask ventilation without difficulty and Mask ventilation throughout procedure Airway Equipment and Method: Bite block Placement Confirmation: positive ETCO2 Dental Injury: Teeth and Oropharynx as per pre-operative assessment

## 2021-02-18 NOTE — Procedures (Signed)
ECT SERVICES Physician's Interval Evaluation & Treatment Note  Patient Identification: Chad [name] MRN:  [id number] Date of Evaluation:  02/18/2021 TX #: 4  MADRS:   MMSE:   P.E. Findings:  No change in physical exam  Psychiatric Interval Note:  Affect appears to be brighter.  Patient is smiling and appears to be more upbeat in his mood  Subjective:  Patient is a 70 y.o. male seen for evaluation for Electroconvulsive Therapy. No specific complaint  Treatment Summary:   [x]   Right Unilateral             []  Bilateral   % Energy : 0.3 ms 75%   Impedance: 2360 ohms  Seizure Energy Index: No reading  Postictal Suppression Index: No reading  Seizure Concordance Index: No reading  Medications  Pre Shock: Toradol 30 mg Brevital 80 mg succinylcholine 100 mg  Post Shock:    Seizure Duration: 29 seconds EMG 31 seconds EEG   Comments: No parameter readings by the computer due to a faulty lead but clearly robust effect of seizure.  Follow-up Wednesday.  That will be treatment #6.  Lungs:  [x]   Clear to auscultation               []  Other:   Heart:    [x]   Regular rhythm             []  irregular rhythm    [x]   Previous H&P reviewed, patient examined and there are NO CHANGES                 []   Previous H&P reviewed, patient examined and there are changes noted.   , MD 10/31/20228:55 PM

## 2021-02-18 NOTE — Progress Notes (Signed)
   02/18/21 1545  Post ECT Assessment  Orientation Level Oriented X4;Oriented to situation;Disoriented to time;Oriented to person;Appropriate for developmental age  Orientation Assessment First & Last Name  Patient Ready to Eat/Drink Yes  Meal Held No   Patient Ready to Ambulate Yes  Ambulation Level of Assist Independent  Ambulation Device Walker (Front wheel)  Able to Void Yes  Vital Signs  Temp 98.3 F (36.8 C)  Temp Source Oral  Pulse Rate 84  Pulse Rate Source Dinamap  Resp 18  BP 112/70  BP Location Left Arm  BP Method Automatic  Patient Position (if appropriate) Sitting  Pain Assessment  Pain Scale 0-10  Pain Score 9  Pain Type Chronic pain  Pain Location Back  Patients Stated Pain Goal 1  Pain Intervention(s) Medication (See eMAR) (Given right after ECT along with other medications)

## 2021-02-18 NOTE — Progress Notes (Addendum)
Patient resting in bed most of day. NPO for  ECT. Upon morning assessment patient reports feeling like "shit" and is c/o pain. Rates pain 10/10. RN reminds patient he is unable to receive any pain medication due to NPO status. Attempts to give patient Protonix but patient refuses. Denies SI/HI/AH/VH. Rates depression 8/10 and anxiety 9/10.   Remains isolative in room all morning. Complaining of headache, back and hip pain upon returning to unit from ECT. See MAR for details.   Patient encouraged to verbalize needs. All needs addressed this shift.   No adverse reactions to medications noted. Cont Q15 minute check for safety.

## 2021-02-18 NOTE — Progress Notes (Signed)
Patient transported off the unit for ECT
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
## 2021-02-18 NOTE — Progress Notes (Signed)
Patient alert and oriented. Patient currently denies SI/HI/AVH. Patient endorses anxiety of 8/10. PRN anxiety medication was given to patient. Patient compliant with medication administration. Patient isolative to room.  Q15 minute safety checks maintained. Patient remains safe on the unit at this time.

## 2021-02-18 NOTE — Anesthesia Postprocedure Evaluation (Signed)
Anesthesia Post Note  Patient: Chad [name]  Procedure(s) Performed: ECT TX  Patient location during evaluation: PACU Anesthesia Type: General Level of consciousness: awake and alert Pain management: pain level controlled Vital Signs Assessment: post-procedure vital signs reviewed and stable Respiratory status: spontaneous breathing, nonlabored ventilation and respiratory function stable Cardiovascular status: blood pressure returned to baseline and stable Postop Assessment: no signs of nausea or vomiting Anesthetic complications: no   No notable events documented.   Last Vitals:  Vitals:   02/18/21 1330 02/18/21 1336  BP: 131/87 131/87  Pulse: 84 84  Resp: 16 18  Temp:  36.8 C  SpO2: 97%     Last Pain:  Vitals:   02/18/21 1336  TempSrc: Temporal  PainSc:                  Jaevion Goto

## 2021-02-18 NOTE — Transfer of Care (Signed)
Immediate Anesthesia Transfer of Care Note  Patient: Chad [name]  Procedure(s) Performed: ECT TX  Patient Location: PACU  Anesthesia Type:General  Level of Consciousness: awake and alert   Airway & Oxygen Therapy: Patient Spontanous Breathing and Patient connected to face mask oxygen  Post-op Assessment: Report given to RN and Post -op Vital signs reviewed and stable  Post vital signs: Reviewed and stable  Last Vitals:  Vitals Value Taken Time  BP    Temp    Pulse    Resp    SpO2      Last Pain:  Vitals:   02/18/21 1121  TempSrc: Tympanic  PainSc: 0-No pain      Patients Stated Pain Goal: 5 (02/16/21 0752)  Complications: No notable events documented.

## 2021-02-18 NOTE — Progress Notes (Addendum)
Patient returned from ECT. Diet order entered and medication given. Patient assesed and remains in room at present.

## 2021-02-18 NOTE — Group Note (Signed)
[facility] LCSW Group Therapy Note    Group Date: 02/18/2021 Start Time: 1300 End Time: 1400  Type of Therapy and Topic:  Group Therapy:  Overcoming Obstacles  Participation Level:  [facility] PARTICIPATION LEVEL: Did Not Attend  Mood:  Description of Group:   In this group patients will be encouraged to explore what they see as obstacles to their own wellness and recovery. They will be guided to discuss their thoughts, feelings, and behaviors related to these obstacles. The group will process together ways to cope with barriers, with attention given to specific choices patients can make. Each patient will be challenged to identify changes they are motivated to make in order to overcome their obstacles. This group will be process-oriented, with patients participating in exploration of their own experiences as well as giving and receiving support and challenge from other group members.  Therapeutic Goals: 1. Patient will identify personal and current obstacles as they relate to admission. 2. Patient will identify barriers that currently interfere with their wellness or overcoming obstacles.  3. Patient will identify feelings, thought process and behaviors related to these barriers. 4. Patient will identify two changes they are willing to make to overcome these obstacles:    Summary of Patient Progress Patient did not attend group despite encouraged participation.    Therapeutic Modalities:   Cognitive Behavioral Therapy Solution Focused Therapy Motivational Interviewing Relapse Prevention Therapy   Corky Crafts, [place]

## 2021-02-19 NOTE — Progress Notes (Signed)
Patient remained in bed most of the day. Out for lunch and dinner and requesting pain medications. Denies SI/HI/AH/VH. Endorses ongoing pain. Rates 9 or 10/10 despite medication. Rates depression 8/10 and anxiety 9/10. All medication given as scheduled and requested. No adverse effects noted. No s/s of distress. Cont Q15 minute check for safety.

## 2021-02-19 NOTE — Group Note (Signed)
[facility] LCSW Group Therapy Note   Group Date: 02/19/2021 Start Time: 1300 End Time: 1400  Type of Therapy/Topic:  Group Therapy:  Feelings about Diagnosis  Participation Level:  Did Not Attend   Description of Group:    This group will allow patients to explore their thoughts and feelings about diagnoses they have received. Patients will be guided to explore their level of understanding and acceptance of these diagnoses. Facilitator will encourage patients to process their thoughts and feelings about the reactions of others to their diagnosis, and will guide patients in identifying ways to discuss their diagnosis with significant others in their lives. This group will be process-oriented, with patients participating in exploration of their own experiences as well as giving and receiving support and challenge from other group members.   Therapeutic Goals: 1. Patient will demonstrate understanding of diagnosis as evidence by identifying two or more symptoms of the disorder:  2. Patient will be able to express two feelings regarding the diagnosis 3. Patient will demonstrate ability to communicate their needs through discussion and/or role plays  Summary of Patient Progress: CSW offered group, however, patient declined to attend.   Therapeutic Modalities:   Cognitive Behavioral Therapy Brief Therapy Feelings Identification    Harden Mo, LCSW

## 2021-02-19 NOTE — Progress Notes (Addendum)
Patient remains in bed this am sleeping. Did not get up for breakfast even after prompting. Morning meds taken to patient by RN

## 2021-02-19 NOTE — Plan of Care (Signed)
  Problem: Education: Goal: Knowledge of [place] [facility] Education information/materials will improve Outcome: Progressing Goal: Emotional status will improve Outcome: Progressing Goal: Mental status will improve Outcome: Progressing Goal: Verbalization of understanding the information provided will improve Outcome: Progressing   Problem: Activity: Goal: Interest or engagement in activities will improve Outcome: Progressing Goal: Sleeping patterns will improve Outcome: Progressing   Problem: Coping: Goal: Ability to verbalize frustrations and anger appropriately will improve Outcome: Progressing Goal: Ability to demonstrate self-control will improve Outcome: Progressing   Problem: Activity: Goal: Interest or engagement in leisure activities will improve Outcome: Progressing Goal: Imbalance in normal sleep/wake cycle will improve Outcome: Progressing   Problem: Education: Goal: Utilization of techniques to improve thought processes will improve Outcome: Progressing Goal: Knowledge of the prescribed therapeutic regimen will improve Outcome: Progressing   Problem: Safety: Goal: Periods of time without injury will increase Outcome: Progressing   Problem: Coping: Goal: Coping ability will improve Outcome: Progressing Goal: Will verbalize feelings Outcome: Progressing   Problem: Self-Concept: Goal: Ability to identify factors that promote anxiety will improve Outcome: Progressing Goal: Level of anxiety will decrease Outcome: Progressing Goal: Ability to modify response to factors that promote anxiety will improve Outcome: Progressing   Problem: Coping: Goal: Ability to identify and develop effective coping behavior will improve Outcome: Progressing   Problem: Education: Goal: Ability to state activities that reduce stress will improve Outcome: Progressing   Problem: Self-Concept: Goal: Will verbalize positive feelings about self Outcome: Progressing Goal:  Level of anxiety will decrease Outcome: Progressing

## 2021-02-19 NOTE — Progress Notes (Signed)
Recreation Therapy Notes   Date: 02/19/2021  Time: 10:15 am   Location: Craft room    Behavioral response: N/A   Intervention Topic: Goals    Discussion/Intervention: Patient did not attend group.   Clinical Observations/Feedback:  Patient did not attend group.   Pearson Reasons LRT/CTRS        Lilleigh Hechavarria 02/19/2021 12:35 PM

## 2021-02-19 NOTE — Progress Notes (Signed)
Patient is in a pleasant mood this evening. Seen in the milieu interacting and more social with others. He is med complaint and received his meds without incident.  Continues to complain of hip pain with pain rating between 7-10. Denies si/hi/avh Reports depression and anxiety are better. Will continue to monitor with Q 15 minute safety checks. Encouraged to seek staff with any concerns.    Cleo Butler-Nicholson, LPN
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
## 2021-02-19 NOTE — Plan of Care (Signed)
  Problem: Education: Goal: Knowledge of [facility] Education information/materials will improve Outcome: Progressing Goal: Emotional status will improve Outcome: Progressing Goal: Mental status will improve Outcome: Progressing Goal: Verbalization of understanding the information provided will improve Outcome: Progressing   Problem: Activity: Goal: Interest or engagement in activities will improve Outcome: Progressing Goal: Sleeping patterns will improve Outcome: Progressing   Problem: Coping: Goal: Ability to verbalize frustrations and anger appropriately will improve Outcome: Progressing Goal: Ability to demonstrate self-control will improve Outcome: Progressing   

## 2021-02-20 ENCOUNTER — Encounter: Payer: Self-pay | Admitting: Psychiatry

## 2021-02-20 ENCOUNTER — Inpatient Hospital Stay: Payer: Medicare HMO | Admitting: Anesthesiology

## 2021-02-20 ENCOUNTER — Other Ambulatory Visit: Payer: Self-pay | Admitting: Psychiatry

## 2021-02-20 ENCOUNTER — Inpatient Hospital Stay: Payer: Medicare HMO

## 2021-02-20 LAB — GLUCOSE, CAPILLARY: Glucose-Capillary: 89 mg/dL (ref 70–99)

## 2021-02-20 MED ORDER — METHOHEXITAL SODIUM 0.5 G IJ SOLR
INTRAMUSCULAR | Status: AC
  Filled 2021-02-20: qty 500

## 2021-02-20 MED ORDER — SUCCINYLCHOLINE CHLORIDE 200 MG/10ML IV SOSY
PREFILLED_SYRINGE | INTRAVENOUS | Status: DC | PRN
  Administered 2021-02-20: 120 mg via INTRAVENOUS

## 2021-02-20 MED ORDER — KETOROLAC TROMETHAMINE 30 MG/ML IJ SOLN: 30.0000 mg | Freq: Once | INTRAMUSCULAR | Status: AC

## 2021-02-20 MED ORDER — METHOHEXITAL SODIUM 100 MG/10ML IV SOSY
PREFILLED_SYRINGE | INTRAVENOUS | Status: DC | PRN
  Administered 2021-02-20: 80 mg via INTRAVENOUS

## 2021-02-20 MED ORDER — SODIUM CHLORIDE 0.9 % IV SOLN
500.0000 mL | Freq: Once | INTRAVENOUS | Status: AC
  Administered 2021-02-20: 500 mL via INTRAVENOUS

## 2021-02-20 MED ORDER — SODIUM CHLORIDE 0.9 % IV SOLN: INTRAVENOUS | Status: DC | PRN

## 2021-02-20 MED ORDER — KETOROLAC TROMETHAMINE 30 MG/ML IJ SOLN
INTRAMUSCULAR | Status: AC
  Administered 2021-02-20: 30 mg via INTRAVENOUS
  Filled 2021-02-20: qty 1

## 2021-02-20 MED ORDER — GLYCOPYRROLATE 0.2 MG/ML IJ SOLN: 0.1000 mg | Freq: Once | INTRAMUSCULAR | Status: AC

## 2021-02-20 MED ORDER — GLYCOPYRROLATE 0.2 MG/ML IJ SOLN
INTRAMUSCULAR | Status: AC
  Administered 2021-02-20: 0.1 mg via INTRAVENOUS
  Filled 2021-02-20: qty 1

## 2021-02-20 NOTE — Progress Notes (Signed)
Recreation Therapy Notes   Date: 02/20/2021  Time: 10:15am  Location: Craft room   Behavioral response: N/A   Intervention Topic: Problem Solving   Discussion/Intervention: Patient did not attend group.   Clinical Observations/Feedback:  Patient did not attend group.   Achol Azpeitia LRT/CTRS         Murna Backer 02/20/2021 12:13 PM
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
## 2021-02-20 NOTE — Procedures (Signed)
ECT SERVICES Physician's Interval Evaluation & Treatment Note  Patient Identification: Daelyn Mozer MRN:  [id number] Date of Evaluation:  02/20/2021 TX #: [id number]  MADRS:   MMSE:   P.E. Findings:  No change to physical exam  Psychiatric Interval Note:  Mood stable not necessarily worse or better  Subjective:  Patient is a 70 y.o. male seen for evaluation for Electroconvulsive Therapy. Feeling okay  Treatment Summary:   [x]   Right Unilateral             []  Bilateral   % Energy : 0.3 ms 75%   Impedance: 1810 ohms ohms  Seizure Energy Index: 98,363 V squared  Postictal Suppression Index: 41 %  Seizure Concordance Index:    Medications  Pre Shock: Toradol 30 mg Brevital 80 mg succinylcholine 100 mg  Post Shock:    Seizure Duration: 16 seconds EMG 16 seconds EEG   Comments: No treatment on Friday will reassess on Monday if still in the hospital  Lungs:  [x]   Clear to auscultation               []  Other:   Heart:    [x]   Regular rhythm             []  irregular rhythm    [x]   Previous H&P reviewed, patient examined and there are NO CHANGES                 []   Previous H&P reviewed, patient examined and there are changes noted.   Sunday, MD 11/2/20229:11 PM

## 2021-02-20 NOTE — Group Note (Signed)
[facility] LCSW Group Therapy Note   Group Date: 02/20/2021 Start Time: 1300 End Time: 1400   Type of Therapy/Topic:  Group Therapy:  Emotion Regulation  Participation Level:  Did Not Attend   Mood:  Description of Group:    The purpose of this group is to assist patients in learning to regulate negative emotions and experience positive emotions. Patients will be guided to discuss ways in which they have been vulnerable to their negative emotions. These vulnerabilities will be juxtaposed with experiences of positive emotions or situations, and patients challenged to use positive emotions to combat negative ones. Special emphasis will be placed on coping with negative emotions in conflict situations, and patients will process healthy conflict resolution skills.  Therapeutic Goals: Patient will identify two positive emotions or experiences to reflect on in order to balance out negative emotions:  Patient will label two or more emotions that they find the most difficult to experience:  Patient will be able to demonstrate positive conflict resolution skills through discussion or role plays:   Summary of Patient Progress:   CSW called for group.  However, pt declined to attend.     Therapeutic Modalities:   Cognitive Behavioral Therapy Feelings Identification Dialectical Behavioral Therapy   Mayara Paulson J Anyely Cunning, LCSW 

## 2021-02-20 NOTE — Anesthesia Preprocedure Evaluation (Signed)
Anesthesia Evaluation  Patient identified by MRN, date of birth, ID band Patient awake    Reviewed: Allergy & Precautions, NPO status , Patient's Chart, lab work & pertinent test results  History of Anesthesia Complications Negative for: history of anesthetic complications  Airway Mallampati: II  TM Distance: >3 FB Neck ROM: Full    Dental  (+) Poor Dentition   Pulmonary neg sleep apnea, neg COPD, Current Smoker,    breath sounds clear to auscultation- rhonchi (-) wheezing      Cardiovascular hypertension, Pt. on medications (-) CAD, (-) Past MI, (-) Cardiac Stents and (-) CABG  Rhythm:Regular Rate:Normal - Systolic murmurs and - Diastolic murmurs    Neuro/Psych  Headaches, neg Seizures PSYCHIATRIC DISORDERS Anxiety Depression    GI/Hepatic Neg liver ROS, GERD  ,  Endo/Other  negative endocrine ROSneg diabetes  Renal/GU negative Renal ROS     Musculoskeletal  (+) Arthritis ,   Abdominal (+) - obese,   Peds  Hematology negative hematology ROS (+)   Anesthesia Other Findings   Reproductive/Obstetrics                             Anesthesia Physical Anesthesia Plan  ASA: 3  Anesthesia Plan: General   Post-op Pain Management:    Induction: Intravenous  PONV Risk Score and Plan: 0 and Ondansetron  Airway Management Planned: Mask  Additional Equipment:   Intra-op Plan:   Post-operative Plan:   Informed Consent: I have reviewed the patients History and Physical, chart, labs and discussed the procedure including the risks, benefits and alternatives for the proposed anesthesia with the patient or authorized representative who has indicated his/her understanding and acceptance.     Dental advisory given  Plan Discussed with: CRNA and Anesthesiologist  Anesthesia Plan Comments:         Anesthesia Quick Evaluation  

## 2021-02-20 NOTE — Transfer of Care (Signed)
Immediate Anesthesia Transfer of Care Note  Patient: Chad [name]  Procedure(s) Performed: ECT TX  Patient Location: PACU  Anesthesia Type:General  Level of Consciousness: awake  Airway & Oxygen Therapy: Patient Spontanous Breathing and Patient connected to nasal cannula oxygen  Post-op Assessment: Report given to RN and Post -op Vital signs reviewed and stable  Post vital signs: Reviewed and stable  Last Vitals:  Vitals Value Taken Time  BP 136/79 02/20/21 1256  Temp    Pulse 85 02/20/21 1257  Resp 16 02/20/21 1257  SpO2 100 % 02/20/21 1257  Vitals shown include unvalidated device data.  Last Pain:  Vitals:   02/20/21 1222  TempSrc:   PainSc: 0-No pain      Patients Stated Pain Goal: 2 (02/19/21 1952)  Complications: No notable events documented.

## 2021-02-20 NOTE — Plan of Care (Signed)
°  Problem: Group Participation °Goal: STG - Patient will engage in groups without prompting or encouragement from LRT x3 group sessions within 5 recreation therapy group sessions °Description: STG - Patient will engage in groups without prompting or encouragement from LRT x3 group sessions within 5 recreation therapy group sessions °Outcome: Progressing °  °

## 2021-02-20 NOTE — Anesthesia Postprocedure Evaluation (Signed)
Anesthesia Post Note  Patient: Cahlil Sattar  Procedure(s) Performed: ECT TX  Patient location during evaluation: PACU Anesthesia Type: General Level of consciousness: awake and alert Pain management: pain level controlled Vital Signs Assessment: post-procedure vital signs reviewed and stable Respiratory status: spontaneous breathing, nonlabored ventilation and respiratory function stable Cardiovascular status: blood pressure returned to baseline and stable Postop Assessment: no signs of nausea or vomiting Anesthetic complications: no   No notable events documented.   Last Vitals:  Vitals:   02/20/21 1315 02/20/21 1329  BP:    Pulse: 79 78  Resp: 19 18  Temp:  (!) 36.3 C  SpO2: 98% 97%    Last Pain:  Vitals:   02/20/21 1329  TempSrc:   PainSc: 0-No pain                 Petronella Shuford

## 2021-02-20 NOTE — Progress Notes (Signed)
D: Pt alert and oriented. Pt  reports experiencing anxiety/depression w/o rating. Pt reports energy level as low and concentration. Pt reports sleep last night as being good. Pt did receive medications for sleep. Pt reports experiencing chronic lower back pain, scheduled morning meds given per approval from Dr. Toni Amend, prn's not given d/t pt going to ECT today. Pt denies experiencing any SI/HI, or AVH at this time.   Pt tolerated ECT well. Pt ate following return from ECT. Pt did complain of Left hip and received prn medication. Pt was irritable this morning when he could not have his prn pain medication.  A: Scheduled medications administered to pt, per MD orders. Support and encouragement provided. Frequent verbal contact made. Routine safety checks conducted q15 minutes.   R: No adverse drug reactions noted. Pt verbally contracts for safety at this time. Pt complaint with medications. Pt interacts well with others on the unit, however minimally. Pt remains safe at this time. Will continue to monitor.

## 2021-02-20 NOTE — Progress Notes (Signed)
Patient calm and pleasant during assessment, denies SI/HI/AVH. Patient endorses anxiety and depression. Patient endorses chronic pain, check MAR. Patient observed interacting appropriately with staff and peers on the unit. Patient compliant with medication administration per MD orders. Pt being monitored Q 15 minutes for safety per unit protocol. Pt remains safe on the unit. Pt stated to this writer that he thinks today's ECT was the best one since he started.
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
## 2021-02-20 NOTE — Progress Notes (Signed)
Patient calm and pleasant during assessment, denies SI/HI/AVH. Patient endorses anxiety and depression. Patient endorses chronic pain, check MAR. Patient observed interacting appropriately with staff and peers on the unit. Patient compliant with medication administration per MD orders. Pt being monitored Q 15 minutes for safety per unit protocol. Pt remains safe on the unit. Pt stated to this writer that he thinks ECT is going well. Pt given education about ECT in the morning.
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
## 2021-02-21 NOTE — Group Note (Signed)
[facility] LCSW Group Therapy Note   Group Date: 02/21/2021 Start Time: 1300 End Time: 1340   Type of Therapy/Topic:  Group Therapy:  Balance in Life  Participation Level:  Did Not Attend   Description of Group:    This group will address the concept of balance and how it feels and looks when one is unbalanced. Patients will be encouraged to process areas in their lives that are out of balance, and identify reasons for remaining unbalanced. Facilitators will guide patients utilizing problem- solving interventions to address and correct the stressor making their life unbalanced. Understanding and applying boundaries will be explored and addressed for obtaining  and maintaining a balanced life. Patients will be encouraged to explore ways to assertively make their unbalanced needs known to significant others in their lives, using other group members and facilitator for support and feedback.  Therapeutic Goals: Patient will identify two or more emotions or situations they have that consume much of in their lives. Patient will identify signs/triggers that life has become out of balance:  Patient will identify two ways to set boundaries in order to achieve balance in their lives:  Patient will demonstrate ability to communicate their needs through discussion and/or role plays  Summary of Patient Progress: X  Therapeutic Modalities:   Cognitive Behavioral Therapy Solution-Focused Therapy Assertiveness Training   Raymond Bhardwaj R Maaz Spiering, LCSW 

## 2021-02-21 NOTE — Progress Notes (Signed)
Recreation Therapy Notes   Date: 02/21/2021   Time: 10:00 am    Location: Craft room  Behavioral response: N/A   Intervention Topic: Leisure    Discussion/Intervention: Patient did not attend group.   Clinical Observations/Feedback:  Patient did not attend group.   Jameka Ivie LRT/CTRS        [place] 02/21/2021 11:31 AM

## 2021-02-21 NOTE — BH IP Treatment Plan (Signed)
Interdisciplinary Treatment and Diagnostic Plan Update  02/21/2021 Time of Session: 8:30AM Chad [name] MRN: [id number]  Principal Diagnosis: Severe recurrent major depression without psychotic features [facility])  Secondary Diagnoses: Principal Problem:   Severe recurrent major depression without psychotic features (HCC) Active Problems:   HTN (hypertension)   Alcohol abuse   Weakness   Hyponatremia   Current Medications:  Current Facility-Administered Medications  Medication Dose Route Frequency Provider Last Rate Last Admin   acamprosate (CAMPRAL) tablet 666 mg  666 mg Oral TID Jesse Sans, MD   666 mg at 02/21/21 0802   acetaminophen (TYLENOL) tablet 650 mg  650 mg Oral Once PRN Reed Breech, MD       Or   acetaminophen (TYLENOL) 160 MG/5ML solution 325-650 mg  325-650 mg Oral Q4H PRN Reed Breech, MD       acetaminophen (TYLENOL) tablet 650 mg  650 mg Oral Q6H PRN Jesse Sans, MD       alum & mag hydroxide-simeth (MAALOX/MYLANTA) 200-200-20 MG/5ML suspension 30 mL  30 mL Oral Q4H PRN Clapacs, Jackquline Denmark, MD       citalopram (CELEXA) tablet 20 mg  20 mg Oral Daily Les Pou M, MD   20 mg at 02/21/21 0802   feeding supplement (ENSURE ENLIVE / ENSURE PLUS) liquid 237 mL  237 mL Oral TID BM Clapacs, John T, MD   237 mL at 02/20/21 2120   folic acid (FOLVITE) tablet 1 mg  1 mg Oral Daily Clapacs, Jackquline Denmark, MD   1 mg at 02/21/21 0803   gabapentin (NEURONTIN) capsule 300 mg  300 mg Oral TID Clapacs, Jackquline Denmark, MD   300 mg at 02/21/21 0802   HYDROcodone-acetaminophen (NORCO/VICODIN) 5-325 MG per tablet 1-2 tablet  1-2 tablet Oral Q6H PRN Jesse Sans, MD   2 tablet at 02/21/21 0406   hydrOXYzine (ATARAX/VISTARIL) tablet 25 mg  25 mg Oral TID PRN Clapacs, Jackquline Denmark, MD   25 mg at 02/20/21 2118   lidocaine (LIDODERM) 5 % 1 patch  1 patch Transdermal Q24H Jesse Sans, MD   1 patch at 02/13/21 2014   magnesium hydroxide (MILK OF MAGNESIA) suspension 30 mL  30 mL Oral Daily PRN  Clapacs, Jackquline Denmark, MD   30 mL at 02/01/21 1433   multivitamin with minerals tablet 1 tablet  1 tablet Oral Daily Clapacs, Jackquline Denmark, MD   1 tablet at 02/21/21 0802   ondansetron (ZOFRAN) injection 4 mg  4 mg Intravenous Once PRN Reed Breech, MD       ondansetron [facility]) tablet 8 mg  8 mg Oral Q8H PRN Jesse Sans, MD   8 mg at 02/11/21 2025   pantoprazole (PROTONIX) EC tablet 40 mg  40 mg Oral Daily Clapacs, Jackquline Denmark, MD   40 mg at 02/21/21 0802   thiamine tablet 100 mg  100 mg Oral Daily Jesse Sans, MD   100 mg at 02/21/21 0803   traZODone (DESYREL) tablet 100 mg  100 mg Oral QHS Beverly Sessions, MD   100 mg at 02/20/21 2118   PTA Medications: Medications Prior to Admission  Medication Sig Dispense Refill Last Dose   acetaminophen (TYLENOL) 325 MG tablet Take 2 tablets (650 mg total) by mouth every 6 (six) hours as needed for mild pain (pain score 1-3 or temp > 100.5).   02/07/2021   citalopram (CELEXA) 10 MG tablet Take 1 tablet (10 mg total) by mouth daily.   02/07/2021   feeding  supplement (ENSURE ENLIVE / ENSURE PLUS) LIQD Take 237 mLs by mouth 3 (three) times daily between meals. 237 mL 12 02/07/2021   folic acid (FOLVITE) 1 MG tablet Take 1 tablet (1 mg total) by mouth daily. 30 tablet 0 02/07/2021   gabapentin (NEURONTIN) 300 MG capsule Take 1 capsule (300 mg total) by mouth 3 (three) times daily. 90 capsule 0 02/07/2021   HYDROcodone-acetaminophen (NORCO/VICODIN) 5-325 MG tablet Take 1-2 tablets by mouth every 6 (six) hours as needed for moderate pain or severe pain. 8 tablet 0 02/07/2021   hydrOXYzine (ATARAX/VISTARIL) 25 MG tablet Take 1 tablet (25 mg total) by mouth 3 (three) times daily as needed for anxiety. 30 tablet 0 02/07/2021   Multiple Vitamin (MULTIVITAMIN WITH MINERALS) TABS tablet Take 1 tablet by mouth daily. 30 tablet 0 02/07/2021   traZODone (DESYREL) 150 MG tablet Take 1 tablet (150 mg total) by mouth at bedtime. 30 tablet 0 02/07/2021   pantoprazole  (PROTONIX) 40 MG tablet Take 1 tablet (40 mg total) by mouth daily. 30 tablet 0     Patient Stressors: Substance abuse    Patient Strengths: Ability for insight  Capable of independent living  Communication skills  Motivation for treatment/growth  Supportive family/friends   Treatment Modalities: Medication Management, Group therapy, Case management,  1 to 1 session with clinician, Psychoeducation, Recreational therapy.   Physician Treatment Plan for Primary Diagnosis: Severe recurrent major depression without psychotic features (HCC) Long Term Goal(s): Improvement in symptoms so as ready for discharge   Short Term Goals: Ability to identify changes in lifestyle to reduce recurrence of condition will improve Ability to verbalize feelings will improve Ability to disclose and discuss suicidal ideas Ability to demonstrate self-control will improve Ability to identify and develop effective coping behaviors will improve Ability to maintain clinical measurements within normal limits will improve Compliance with prescribed medications will improve Ability to identify triggers associated with substance abuse/mental health issues will improve  Medication Management: Evaluate patient's response, side effects, and tolerance of medication regimen.  Therapeutic Interventions: 1 to 1 sessions, Unit Group sessions and Medication administration.  Evaluation of Outcomes: Progressing  Physician Treatment Plan for Secondary Diagnosis: Principal Problem:   Severe recurrent major depression without psychotic features (HCC) Active Problems:   HTN (hypertension)   Alcohol abuse   Weakness   Hyponatremia  Long Term Goal(s): Improvement in symptoms so as ready for discharge   Short Term Goals: Ability to identify changes in lifestyle to reduce recurrence of condition will improve Ability to verbalize feelings will improve Ability to disclose and discuss suicidal ideas Ability to demonstrate  self-control will improve Ability to identify and develop effective coping behaviors will improve Ability to maintain clinical measurements within normal limits will improve Compliance with prescribed medications will improve Ability to identify triggers associated with substance abuse/mental health issues will improve     Medication Management: Evaluate patient's response, side effects, and tolerance of medication regimen.  Therapeutic Interventions: 1 to 1 sessions, Unit Group sessions and Medication administration.  Evaluation of Outcomes: Progressing   RN Treatment Plan for Primary Diagnosis: Severe recurrent major depression without psychotic features (HCC) Long Term Goal(s): Knowledge of disease and therapeutic regimen to maintain health will improve  Short Term Goals: Ability to demonstrate self-control, Ability to participate in decision making will improve, Ability to verbalize feelings will improve, Ability to disclose and discuss suicidal ideas, and Ability to identify and develop effective coping behaviors will improve  Medication Management: RN will administer medications as  ordered by provider, will assess and evaluate patient's response and provide education to patient for prescribed medication. RN will report any adverse and/or side effects to prescribing provider.  Therapeutic Interventions: 1 on 1 counseling sessions, Psychoeducation, Medication administration, Evaluate responses to treatment, Monitor vital signs and CBGs as ordered, Perform/monitor CIWA, COWS, AIMS and Fall Risk screenings as ordered, Perform wound care treatments as ordered.  Evaluation of Outcomes: Progressing   LCSW Treatment Plan for Primary Diagnosis: Severe recurrent major depression without psychotic features (HCC) Long Term Goal(s): Safe transition to appropriate next level of care at discharge, Engage patient in therapeutic group addressing interpersonal concerns.  Short Term Goals: Engage  patient in aftercare planning with referrals and resources, Increase social support, Increase ability to appropriately verbalize feelings, Increase emotional regulation, Facilitate acceptance of mental health diagnosis and concerns, and Increase skills for wellness and recovery  Therapeutic Interventions: Assess for all discharge needs, 1 to 1 time with Social worker, Explore available resources and support systems, Assess for adequacy in community support network, Educate family and significant other(s) on suicide prevention, Complete Psychosocial Assessment, Interpersonal group therapy.  Evaluation of Outcomes: Progressing   Progress in Treatment: Attending groups: No. Participating in groups: No. Taking medication as prescribed: Yes. Toleration medication: Yes. Family/Significant other contact made: Yes, individual(s) contacted:  SPE completed with patient. Patient understands diagnosis: Yes. Discussing patient identified problems/goals with staff: Yes. Medical problems stabilized or resolved: Yes. Denies suicidal/homicidal ideation: Yes. Issues/concerns per patient self-inventory: No. Other: none.  New problem(s) identified: No, Describe:  no new problems identified at this time.  New Short Term/Long Term Goal(s): medication management for mood stabilization; elimination of SI thoughts; development of comprehensive mental wellness/sobriety plan. Update 02/21/21: No changes at this time.   Patient Goals: "Have not put a lot of thought into it:" Update 02/06/2021:  No additional goals identified since last treatment team. Update 02/11/2021: No changes at this time. Update 02/16/2021: No changes at this time. Update 02/21/21: No changes at this time.   Discharge Plan or Barriers: No barriers identified at this time. Update 02/06/2021: Patient needs outpatient PT per recommendation from physical therapy. Patient interested in [facility].  Update 02/11/2021:  Patient has begun ECT  treatment to treat continuous depression.  He has been referred to outpatient PT.  He reports plans to return to his home. Update 02/16/2021: No changes at this time. Update 02/21/21: No changes at this time.   Reason for Continuation of Hospitalization: Depression  Estimated Length of Stay: TBD   Scribe for Treatment Team: Glenis Smoker, LCSW 02/21/2021 9:20 AM

## 2021-02-22 NOTE — Progress Notes (Signed)
Patient currently denies SI/HI/AVH. Patient endorses pain 9/10, PRN pain medication given to patient. Patient compliant with medication administration. Q15 minute safety checks maintained. Patient remains safe on the unit at this time.

## 2021-02-22 NOTE — Progress Notes (Signed)
Recreation Therapy Notes   Date: 02/22/2021   Time: 9:45am    Location: Craft room  Behavioral response: N/A   Intervention Topic: Creative expression   Discussion/Intervention: Patient did not attend group.   Clinical Observations/Feedback:  Patient did not attend group.   Laurelyn Terrero LRT/CTRS        Elvena Oyer 02/22/2021 11:25 AM

## 2021-02-22 NOTE — Group Note (Signed)
[facility] LCSW Group Therapy Note   Group Date: 02/22/2021 Start Time: 1300 End Time: 1400  Type of Therapy and Topic:  Group Therapy:  Feelings around Relapse and Recovery  Participation Level:  Did Not Attend   Mood:  Description of Group:    Patients in this group will discuss emotions they experience before and after a relapse. They will process how experiencing these feelings, or avoidance of experiencing them, relates to having a relapse. Facilitator will guide patients to explore emotions they have related to recovery. Patients will be encouraged to process which emotions are more powerful. They will be guided to discuss the emotional reaction significant others in their lives may have to patients' relapse or recovery. Patients will be assisted in exploring ways to respond to the emotions of others without this contributing to a relapse.  Therapeutic Goals: Patient will identify two or more emotions that lead to relapse for them:  Patient will identify two emotions that result when they relapse:  Patient will identify two emotions related to recovery:  Patient will demonstrate ability to communicate their needs through discussion and/or role plays.   Summary of Patient Progress: Patient did not attend group despite encouraged participation.    Therapeutic Modalities:   Cognitive Behavioral Therapy Solution-Focused Therapy Assertiveness Training Relapse Prevention Therapy   Corky Crafts, [place]
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
## 2021-02-22 NOTE — Plan of Care (Signed)
Patient denies SI/HI/AVH and contracts for safety.  Patient did have minimal participation on unit during shift. Patient did comply with scheduled medications as ordered by provider. Patient denies any adverse reactions from medications given.  Patient encourage to participate in group therapy. Q 15 min safety checks will continue during shift.  Patient informed to notify staff if any needs arise.   Problem: Education: Goal: Emotional status will improve Outcome: Progressing Goal: Verbalization of understanding the information provided will improve Outcome: Progressing   Problem: Activity: Goal: Interest or engagement in activities will improve Outcome: Progressing

## 2021-02-23 NOTE — Progress Notes (Signed)
Patient currently denies SI/HI/AVH. Patient endorses pain 9/10, PRN pain medication given to patient. Patient compliant with medication administration. Q15 minute safety checks maintained. Patient remains safe on the unit at this time.

## 2021-02-23 NOTE — Group Note (Signed)
LCSW Group Therapy Note  Group Date: 02/23/2021 Start Time: 1330 End Time: 1410   Type of Therapy and Topic:  Group Therapy - Healthy vs Unhealthy Coping Skills  Participation Level:  Did Not Attend   Description of Group The focus of this group was to determine what unhealthy coping techniques typically are used by group members and what healthy coping techniques would be helpful in coping with various problems. Patients were guided in becoming aware of the differences between healthy and unhealthy coping techniques. Patients were asked to identify 2-3 healthy coping skills they would like to learn to use more effectively.  Therapeutic Goals Patients learned that coping is what human beings do all day long to deal with various situations in their lives Patients defined and discussed healthy vs unhealthy coping techniques Patients identified their preferred coping techniques and identified whether these were healthy or unhealthy Patients determined 2-3 healthy coping skills they would like to become more familiar with and use more often. Patients provided support and ideas to each other   Summary of Patient Progress: Patient did not attend group despite encouraged participation.    Therapeutic Modalities Cognitive Behavioral Therapy Motivational Interviewing  Norberto Sorenson, Theresia Majors 02/23/2021  4:43 PM

## 2021-02-23 NOTE — Plan of Care (Signed)
Met with Pt in Pt room. Pt is A/O x4. No signs of distress or injury noted. Vital signs from AM are WNL. Pt reports 9/10 pain and reports need for PRN medications for relief. Pt denies SI / HI / AVH. Pt is minimal with assessment. Pt has appropriate affect and mood. Pt is adherent with scheduled medications this morning. Staff will continue to monitor per protocol.    Problem: Education: Goal: Knowledge of [place] [place] [facility] Education information/materials will improve Outcome: Progressing Goal: Emotional status will improve Outcome: Not Progressing Goal: Mental status will improve Outcome: Not Progressing Goal: Verbalization of understanding the information provided will improve Outcome: Progressing
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
## 2021-02-24 NOTE — Progress Notes (Signed)
Patient alert and oriented x 4, affect is blunted thoughts are organized denies SI/HI/AVH  interacting appropriately with peers and staff. He complained of pain constantly  and was medicated as needed, 15 minutes safety checks maintained.

## 2021-02-24 NOTE — [facility] Counselor (Signed)
Important Message  Patient Details  Name: Chad [name] MRN: [id number] Date of Birth: 11-19-1950   Medicare Important Message Given:  Yes  Patient is planned to discharge from inpatient unit tomorrow, 02/25/21. CSW gave patient Important Message with the [facility] appeal phone number highlighted. Patient stated he would like to make an appeal regarding discharge. [facility] phone provided to patient.    [name], LCSWA 02/24/2021, 10:31 AM

## 2021-02-24 NOTE — Plan of Care (Signed)
  Problem: Education: Goal: Knowledge of [place] [facility] Education information/materials will improve Outcome: Progressing Goal: Emotional status will improve Outcome: Progressing Goal: Mental status will improve Outcome: Progressing Goal: Verbalization of understanding the information provided will improve Outcome: Progressing   Problem: Activity: Goal: Interest or engagement in activities will improve Outcome: Progressing Goal: Sleeping patterns will improve Outcome: Progressing   Problem: Coping: Goal: Ability to verbalize frustrations and anger appropriately will improve Outcome: Progressing Goal: Ability to demonstrate self-control will improve Outcome: Progressing   Problem: Activity: Goal: Interest or engagement in activities will improve Outcome: Progressing Goal: Sleeping patterns will improve Outcome: Progressing   Problem: Health Behavior/Discharge Planning: Goal: Identification of resources available to assist in meeting health care needs will improve Outcome: Progressing Goal: Compliance with treatment plan for underlying cause of condition will improve Outcome: Progressing   Problem: Physical Regulation: Goal: Ability to maintain clinical measurements within normal limits will improve Outcome: Progressing   Problem: Safety: Goal: Periods of time without injury will increase Outcome: Progressing   Problem: Education: Goal: Utilization of techniques to improve thought processes will improve Outcome: Progressing Goal: Knowledge of the prescribed therapeutic regimen will improve Outcome: Progressing   Problem: Activity: Goal: Interest or engagement in leisure activities will improve Outcome: Progressing Goal: Imbalance in normal sleep/wake cycle will improve Outcome: Progressing   Problem: Coping: Goal: Coping ability will improve Outcome: Progressing Goal: Will verbalize feelings Outcome: Progressing   Problem: Health Behavior/Discharge  Planning: Goal: Ability to make decisions will improve Outcome: Progressing Goal: Compliance with therapeutic regimen will improve Outcome: Progressing

## 2021-02-24 NOTE — Group Note (Signed)
LCSW Group Therapy Note  Group Date: 02/24/2021 Start Time: 1300 End Time: 1350   Type of Therapy and Topic:  Group Therapy - How To Cope with Nervousness about Discharge   Participation Level:  Did Not Attend   Description of Group This process group involved identification of patients' feelings about discharge. Some of them are scheduled to be discharged soon, while others are new admissions, but each of them was asked to share thoughts and feelings surrounding discharge from the hospital. One common theme was that they are excited at the prospect of going home, while another was that many of them are apprehensive about sharing why they were hospitalized. Patients were given the opportunity to discuss these feelings with their peers in preparation for discharge.  Therapeutic Goals  Patient will identify their overall feelings about pending discharge. Patient will think about how they might proactively address issues that they believe will once again arise once they get home (i.e. with parents). Patients will participate in discussion about having hope for change.   Summary of Patient Progress: Patient did not attend group despite encouraged participation.    Therapeutic Modalities Cognitive Behavioral Therapy   Marletta Lor 02/24/2021  5:12 PM

## 2021-02-24 NOTE — Progress Notes (Signed)
Patient alert and awake this morning. Sitting in day room. Takes medications as prescribed. Denies SI/HI/AVH. Endorsees pain. See pain assessment for details. No pain medication due at present. Rates anxiety and depression 9/10. Prn medication administered. No adverse reaction to medication noted. NO s/s of distress. Cont to monitor for safety.
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
## 2021-02-25 NOTE — Group Note (Signed)
[facility] LCSW Group Therapy Note    Group Date: 02/25/2021 Start Time: 1300 End Time: 1400  Type of Therapy and Topic:  Group Therapy:  Overcoming Obstacles  Participation Level:  [facility] PARTICIPATION LEVEL: Did Not Attend  Mood:  Description of Group:   In this group patients will be encouraged to explore what they see as obstacles to their own wellness and recovery. They will be guided to discuss their thoughts, feelings, and behaviors related to these obstacles. The group will process together ways to cope with barriers, with attention given to specific choices patients can make. Each patient will be challenged to identify changes they are motivated to make in order to overcome their obstacles. This group will be process-oriented, with patients participating in exploration of their own experiences as well as giving and receiving support and challenge from other group members.  Therapeutic Goals: 1. Patient will identify personal and current obstacles as they relate to admission. 2. Patient will identify barriers that currently interfere with their wellness or overcoming obstacles.  3. Patient will identify feelings, thought process and behaviors related to these barriers. 4. Patient will identify two changes they are willing to make to overcome these obstacles:    Summary of Patient Progress   X   Therapeutic Modalities:   Cognitive Behavioral Therapy Solution Focused Therapy Motivational Interviewing Relapse Prevention Therapy   Harden Mo, LCSW

## 2021-02-25 NOTE — Plan of Care (Signed)
Patient pleasant cooperative on approach.When asked about sleep patient staes this morning " too much sleep." Denies SI,HI and AVH.Appetite and energy level good. ADLs maintained. Ambulates with walker. Support and encouragement given.

## 2021-02-25 NOTE — Progress Notes (Signed)
Patient is pleasant and cooperative.  He has been isolative to his room this evening. Upset about plan to discharge him, as he believes he needs a few more days of hospitalization. He is med compliant and received his meds without incident.  Continues to have pain level rated 8/10 this evening.  Denies si/hi/avh, but rates his anxiety and depression at 9/10. Encouraged him to seek staff with any questions or concerns. Will continue to monitor with Q 15 minute safety rounds.     Cleo Butler-Nicholson, LPN
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
## 2021-02-25 NOTE — Plan of Care (Signed)
  Problem: Education: Goal: Emotional status will improve Outcome: Progressing   Problem: Education: Goal: Mental status will improve Outcome: Progressing   Problem: Education: Goal: Verbalization of understanding the information provided will improve Outcome: Progressing   Problem: Activity: Goal: Sleeping patterns will improve Outcome: Progressing   Problem: Activity: Goal: Sleeping patterns will improve Outcome: Progressing   Problem: Coping: Goal: Ability to verbalize frustrations and anger appropriately will improve Outcome: Progressing   Problem: Coping: Goal: Ability to demonstrate self-control will improve Outcome: Progressing

## 2021-02-25 NOTE — Progress Notes (Signed)
Patient has been asleep since the start of the shift. Attempted to wake patient to get snack, but he declined his snack and the Ensure supplement drink. Will continue to monitor with Q15 minute safety rounds.   Cleo Butler-Nicholson, LPN

## 2021-02-25 NOTE — Progress Notes (Signed)
Recreation Therapy Notes   Date: 02/25/2021   Time: 10:15 am   Location: Craft room    Behavioral response: N/A   Intervention Topic: Stress   Discussion/Intervention: Patient did not attend group.   Clinical Observations/Feedback:  Patient did not attend group.   Kalis Friese LRT/CTRS        Kabria Hetzer 02/25/2021 12:10 PM

## 2021-02-26 NOTE — Progress Notes (Signed)
Recreation Therapy Notes  Date: 02/26/2021  Time: 10:00 am   Location: Craft room    Behavioral response: Appropriate  Intervention Topic: Self-care    Discussion/Intervention:  Group content today was focused on Self-Care. The group defined self-care and some positive ways they care for themselves. Individuals expressed ways and reasons why they neglected any self-care in the past. Patients described ways to improve self-care in the future. The group explained what could happen if they did not do any self-care activities at all. The group participated in the intervention "self-care assessment" where they had a chance to discover some of their weaknesses and strengths in self- care. Patient came up with a self-care plan to improve themselves in the future.  Clinical Observations/Feedback: Patient came to group and explained that self-care  is taking care of personal hygiene. Individual was social with peers and staff while participating in the intervention. Asar Evilsizer LRT/CTRS             Kashten Gowin 02/26/2021 11:56 AM

## 2021-02-26 NOTE — Group Note (Signed)
[facility] LCSW Group Therapy Note   Group Date: 02/26/2021 Start Time: 1300 End Time: 1400  Type of Therapy/Topic:  Group Therapy:  Feelings about Diagnosis  Participation Level:  Did Not Attend   Mood: n/a   Description of Group:    This group will allow patients to explore their thoughts and feelings about diagnoses they have received. Patients will be guided to explore their level of understanding and acceptance of these diagnoses. Facilitator will encourage patients to process their thoughts and feelings about the reactions of others to their diagnosis, and will guide patients in identifying ways to discuss their diagnosis with significant others in their lives. This group will be process-oriented, with patients participating in exploration of their own experiences as well as giving and receiving support and challenge from other group members.   Therapeutic Goals: 1. Patient will demonstrate understanding of diagnosis as evidence by identifying two or more symptoms of the disorder:  2. Patient will be able to express two feelings regarding the diagnosis 3. Patient will demonstrate ability to communicate their needs through discussion and/or role plays  Summary of Patient Progress: Patient did not attend group despite encouraged participation.     Therapeutic Modalities:   Cognitive Behavioral Therapy Brief Therapy Feelings Identification    Roman W Clerence Gubser, LCSWA 

## 2021-02-26 NOTE — Plan of Care (Signed)
  Problem: Education: Goal: Knowledge of [facility] Education information/materials will improve Outcome: Progressing Goal: Emotional status will improve Outcome: Progressing Goal: Mental status will improve Outcome: Progressing Goal: Verbalization of understanding the information provided will improve Outcome: Progressing   Problem: Activity: Goal: Interest or engagement in activities will improve Outcome: Progressing Goal: Sleeping patterns will improve Outcome: Progressing   Problem: Coping: Goal: Ability to verbalize frustrations and anger appropriately will improve Outcome: Progressing Goal: Ability to demonstrate self-control will improve Outcome: Progressing   Problem: Health Behavior/Discharge Planning: Goal: Identification of resources available to assist in meeting health care needs will improve Outcome: Progressing Goal: Compliance with treatment plan for underlying cause of condition will improve Outcome: Progressing   Problem: Physical Regulation: Goal: Ability to maintain clinical measurements within normal limits will improve Outcome: Progressing   Problem: Safety: Goal: Periods of time without injury will increase Outcome: Progressing   Problem: Education: Goal: Utilization of techniques to improve thought processes will improve Outcome: Progressing Goal: Knowledge of the prescribed therapeutic regimen will improve Outcome: Progressing   Problem: Activity: Goal: Interest or engagement in leisure activities will improve Outcome: Progressing Goal: Imbalance in normal sleep/wake cycle will improve Outcome: Progressing   Problem: Coping: Goal: Coping ability will improve Outcome: Progressing Goal: Will verbalize feelings Outcome: Progressing   Problem: Health Behavior/Discharge Planning: Goal: Ability to make decisions will improve Outcome: Progressing Goal: Compliance with therapeutic regimen will improve Outcome: Progressing    Problem: Health Behavior/Discharge Planning: Goal: Ability to make decisions will improve Outcome: Progressing Goal: Compliance with therapeutic regimen will improve Outcome: Progressing   Problem: Safety: Goal: Ability to disclose and discuss suicidal ideas will improve Outcome: Progressing Goal: Ability to identify and utilize support systems that promote safety will improve Outcome: Progressing   Problem: Self-Concept: Goal: Will verbalize positive feelings about self Outcome: Progressing Goal: Level of anxiety will decrease Outcome: Progressing   Problem: Education: Goal: Ability to state activities that reduce stress will improve Outcome: Progressing   Problem: Coping: Goal: Ability to identify and develop effective coping behavior will improve Outcome: Progressing   Problem: Self-Concept: Goal: Ability to identify factors that promote anxiety will improve Outcome: Progressing Goal: Level of anxiety will decrease Outcome: Progressing Goal: Ability to modify response to factors that promote anxiety will improve Outcome: Progressing

## 2021-02-26 NOTE — [facility] Counselor (Addendum)
CSW attempted to contact Jerilee Hoh [facility] APS) about trying to see pt. CSW called office [phone]) and was able to leave a voicemail. CSW called cell [phone]) but did not get a response.   Sumpter requested to meet with pt tomorrow, 02/27/21 at 1 or 2PM per voicemail she left. CSW left contact information for follow up and requesting a call back. CSW will attempt contact tomorrow morning as well.  Vilma Meckel. Algis Greenhouse, MSW, LCSW, LCAS 02/26/2021 4:22 PM

## 2021-02-26 NOTE — [facility] Counselor (Signed)
CSW met with pt yesterday to go over the DND and HINN 12. Pt and CSW discussed this briefly. Pt agreed to sign the DND. Paperwork was submitted to [employer].   Team notified that pt appeal declined. Pt notified and informed that if he has any questions he can speak with CSW. No other concerns expressed. Contact ended without incident.   Chalmers Guest. Guerry Bruin, MSW, LCSW, [place] 02/26/2021 1:58 PM

## 2021-02-26 NOTE — Plan of Care (Signed)
  Problem: Education: Goal: Knowledge of [facility] Education information/materials will improve Outcome: Progressing Goal: Emotional status will improve Outcome: Progressing Goal: Mental status will improve Outcome: Progressing Goal: Verbalization of understanding the information provided will improve Outcome: Progressing   Problem: Activity: Goal: Interest or engagement in activities will improve Outcome: Progressing Goal: Sleeping patterns will improve Outcome: Progressing   Problem: Coping: Goal: Ability to verbalize frustrations and anger appropriately will improve Outcome: Progressing Goal: Ability to demonstrate self-control will improve Outcome: Progressing   
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
## 2021-02-26 NOTE — Progress Notes (Signed)
CSW checked on status of [employer] appeal. Remains under clinical review. Situation ongoing, CSW will continue to monitor and update note as more information becomes available.   Signed:  Corky Crafts, MSW, [place], LCASA 02/26/2021 8:44 AM

## 2021-02-26 NOTE — Progress Notes (Signed)
Pt awake, alert and present on the milieu. Denies SI/HI/AVH. Endorses anxiety and depression. Rates both 8/10. Reports chronic pain to several locations. See pain assessment for details. PRN medication given as needed per order.   Patient is present on milieu and interacts appropriately with staff and peers. Takes all medications as prescribed. No s/s of distress noted. Cont Q15 minute for safety.

## 2021-02-26 NOTE — Progress Notes (Signed)
Patient at the nurses station asking for his PRN pain medication.      Cleo Francena Hanly, LPN

## 2021-02-27 MED ORDER — TRAZODONE HCL 100 MG PO TABS
100.0000 mg | ORAL_TABLET | Freq: Every day | ORAL | 1 refills | Status: DC
Start: 2021-02-27 — End: 2021-09-10

## 2021-02-27 MED ORDER — GABAPENTIN 300 MG PO CAPS: 300.0000 mg | ORAL_CAPSULE | Freq: Three times a day (TID) | ORAL | 0 refills | Status: DC

## 2021-02-27 MED ORDER — ACAMPROSATE CALCIUM 333 MG PO TBEC: 666.0000 mg | DELAYED_RELEASE_TABLET | Freq: Three times a day (TID) | ORAL | 1 refills | Status: DC

## 2021-02-27 MED ORDER — FOLIC ACID 1 MG PO TABS: 1.0000 mg | ORAL_TABLET | Freq: Every day | ORAL | 1 refills | Status: DC

## 2021-02-27 MED ORDER — THIAMINE HCL 100 MG PO TABS: 100.0000 mg | ORAL_TABLET | Freq: Every day | ORAL | 1 refills | Status: DC

## 2021-02-27 MED ORDER — PANTOPRAZOLE SODIUM 40 MG PO TBEC: 40.0000 mg | DELAYED_RELEASE_TABLET | Freq: Every day | ORAL | 1 refills | Status: DC

## 2021-02-27 MED ORDER — HYDROXYZINE HCL 25 MG PO TABS: 25.0000 mg | ORAL_TABLET | Freq: Three times a day (TID) | ORAL | 1 refills | Status: DC | PRN

## 2021-02-27 MED ORDER — HYDROCODONE-ACETAMINOPHEN 5-325 MG PO TABS: 1.0000 | ORAL_TABLET | Freq: Four times a day (QID) | ORAL | 0 refills | Status: AC | PRN

## 2021-02-27 MED ORDER — CITALOPRAM HYDROBROMIDE 20 MG PO TABS: 20.0000 mg | ORAL_TABLET | Freq: Every day | ORAL | 1 refills | Status: DC

## 2021-02-27 NOTE — Progress Notes (Cosign Needed)
  [facility] Adult Case Management Discharge Plan :  Will you be returning to the same living situation after discharge:  Yes,  Patient plans to return home. At discharge, do you have transportation home?: Yes,  CSW intern to arrange transportation. Do you have the ability to pay for your medications: Yes,  [employer] medicare   Release of information consent forms completed and in the chart;  Patient's signature needed at discharge.  Patient to Follow up at:  Follow-up Information     Therapy, [facility]. Go on 02/12/2021.   Why: Please contact to schedule initial appointment. Contact information: [address] [place] [address] [phone]         [facility] Follow up.   Why: They offer virtual appointments.  They will call you at discharge to schedule the appointment.  Thanks! Contact information: [address] [place] [address]                Next level of care provider has access to [facility] [employer] and Suicide Prevention discussed: Yes,  SPE completed with patient.     Has patient been referred to the Quitline?: N/A patient is not a smoker  Patient has been referred for addiction treatment: Pt. refused referral  Starleen Arms, Student-Social Work 02/27/2021, 11:45 AM

## 2021-02-27 NOTE — BHH Counselor (Signed)
CSW contacted Jerilee Hoh [facility] APS [phone]). Sumpter was notified regarding pt discharge today. She stated that she would see him in the community. No other concerns expressed. Contact ended without incident.   Vilma Meckel. Algis Greenhouse, MSW, LCSW, LCAS 02/27/2021 10:55 AM

## 2021-02-27 NOTE — Progress Notes (Signed)
Recreation Therapy Notes  INPATIENT RECREATION TR PLAN  Patient Details Name: Chad [name] MRN: [id number] DOB: Sep 07, 1950 Today's Date: 02/27/2021  Rec Therapy Plan Is patient appropriate for Therapeutic Recreation?: Yes Treatment times per week: at least 3 Estimated Length of Stay: 5-7 days TR Treatment/Interventions: Group participation (Comment)  Discharge Criteria Pt will be discharged from therapy if:: Discharged Treatment plan/goals/alternatives discussed and agreed upon by:: Patient/family  Discharge Summary Short term goals set: Patient will engage in groups without prompting or encouragement from LRT x3 group sessions within 5 recreation therapy group sessions Short term goals met: Complete Progress toward goals comments: Groups attended Which groups?: Leisure education, Communication, Stress management, Other (Comment) (Self-care, Time-management, Creative expressions, Team work) Reason goals not met: N/A Therapeutic equipment acquired: N/A Reason patient discharged from therapy: Discharge from hospital Pt/family agrees with progress & goals achieved: Yes Date patient discharged from therapy: 02/27/21   Britain Saber 02/27/2021, 12:42 PM

## 2021-02-27 NOTE — Progress Notes (Signed)
Patient denies SI,HI and AVH.Appropriate with staff & peers. Verbalized understanding discharge instructions and follow up care. All belongings returned from [facility] locker.Patient escorted out in wheelchair by staff and transported by safe transport.

## 2021-02-27 NOTE — [facility] Suicide Risk Assessment (Signed)
[facility] Discharge Suicide Risk Assessment   Principal Problem: Severe recurrent major depression without psychotic features (HCC) Discharge Diagnoses: Principal Problem:   Severe recurrent major depression without psychotic features (HCC) Active Problems:   HTN (hypertension)   Alcohol abuse   Weakness   Hyponatremia   Total Time spent with patient: 35 minutes- 25 minutes face-to-face contact with patient, 10 minutes documentation, coordination of care, scripts   Musculoskeletal: Strength & Muscle Tone: within normal limits Gait & Station: normal Patient leans: N/A  Psychiatric Specialty Exam  Presentation  General Appearance: Appropriate for Environment; Casual  Eye Contact:Good  Speech:Normal Rate; Clear and Coherent  Speech Volume:Normal  Handedness:Right   Mood and Affect  Mood:Depressed  Duration of Depression Symptoms: No data recorded Affect:Congruent   Thought Process  Thought Processes:Goal Directed  Descriptions of Associations:Intact  Orientation:Full (Time, Place and Person)  Thought Content:Logical  History of Schizophrenia/Schizoaffective disorder:No data recorded Duration of Psychotic Symptoms:No data recorded Hallucinations:Hallucinations: None  Ideas of Reference:None  Suicidal Thoughts:Suicidal Thoughts: No  Homicidal Thoughts:Homicidal Thoughts: No   Sensorium  Memory:Immediate Fair; Recent Fair; Remote Fair  Judgment:Intact  Insight:Present   Executive Functions  Concentration:Fair  Attention Span:Fair  Recall:Fair  Fund of Knowledge:Fair  Language:Fair   Psychomotor Activity  Psychomotor Activity:Psychomotor Activity: Normal   Assets  Assets:Communication Skills; Desire for Improvement; Financial Resources/Insurance; Housing; Resilience; Social Support; Talents/Skills   Sleep  Sleep:Sleep: Fair   Physical Exam: Physical Exam ROS Blood pressure (!) 150/87, pulse 90, temperature 97.7 F (36.5 C),  temperature source Oral, resp. rate 18, height 5\' 8"  (1.727 m), weight 78 kg, SpO2 99 %. Body mass index is 26.15 kg/m.  Mental Status Per Nursing Assessment::   On Admission:  Self-harm thoughts  Demographic Factors:  Male, Age 70 or older, and Caucasian  Loss Factors: NA  Historical Factors: NA  Risk Reduction Factors:   Religious beliefs about death, Positive social support, Positive therapeutic relationship, and Positive coping skills or problem solving skills  Continued Clinical Symptoms:  Depression:   Recent sense of peace/wellbeing Alcohol/Substance Abuse/Dependencies Previous Psychiatric Diagnoses and Treatments  Cognitive Features That Contribute To Risk:  None    Suicide Risk:  Minimal: No identifiable suicidal ideation.  Patients presenting with no risk factors but with morbid ruminations; may be classified as minimal risk based on the severity of the depressive symptoms   Follow-up Information     Therapy, [facility]. Go on 02/12/2021.   Why: Please present for initial appointment on Tuesday 25 October at 1130AM Contact information: [address] [place] [phone]                 Plan Of Care/Follow-up recommendations:  Activity:  as tolerated Diet:  low sodium heart healthy diet  [phone], MD 02/27/2021, 8:58 AM

## 2021-02-27 NOTE — Progress Notes (Signed)
Recreation Therapy Notes   Date: 02/27/2021  Time: 10:15 am   Location: Craft room  Behavioral response: Appropriate  Intervention Topic: Leisure    Discussion/Intervention:  Group content today was focused on leisure. The group defined what leisure is and some positive leisure activities they participate in. Individuals identified the difference between good and bad leisure. Participants expressed how they feel after participating in the leisure of their choice. The group discussed how they go about picking a leisure activity and if others are involved in their leisure activities. The patient stated how many leisure activities they have to choose from and reasons why it is important to have leisure time. Individuals participated in the intervention "Exploration of Leisure" where they had a chance to identify new leisure activities as well as benefits of leisure. Clinical Observations/Feedback: Patient came to group and was focused on what peers and staff had to say about leisure. Individual was social with staff while participating in the intervention.  Chidi Shirer LRT/CTRS         Tamzin Bertling 02/27/2021 11:54 AM
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
## 2021-02-27 NOTE — BH IP Treatment Plan (Signed)
Interdisciplinary Treatment and Diagnostic Plan Update  02/27/2021 Time of Session: 0900  Chad [name] MRN: [id number]  Principal Diagnosis: Severe recurrent major depression without psychotic features [facility])  Secondary Diagnoses: Principal Problem:   Severe recurrent major depression without psychotic features (HCC) Active Problems:   HTN (hypertension)   Alcohol abuse   Weakness   Hyponatremia   Current Medications:  Current Facility-Administered Medications  Medication Dose Route Frequency Provider Last Rate Last Admin   acamprosate (CAMPRAL) tablet 666 mg  666 mg Oral TID Jesse Sans, MD   666 mg at 02/26/21 1717   acetaminophen (TYLENOL) tablet 650 mg  650 mg Oral Once PRN Reed Breech, MD       Or   acetaminophen (TYLENOL) 160 MG/5ML solution 325-650 mg  325-650 mg Oral Q4H PRN Reed Breech, MD       acetaminophen (TYLENOL) tablet 650 mg  650 mg Oral Q6H PRN Jesse Sans, MD       alum & mag hydroxide-simeth (MAALOX/MYLANTA) 200-200-20 MG/5ML suspension 30 mL  30 mL Oral Q4H PRN Clapacs, Jackquline Denmark, MD       citalopram (CELEXA) tablet 20 mg  20 mg Oral Daily Jesse Sans, MD   20 mg at 02/26/21 0841   feeding supplement (ENSURE ENLIVE / ENSURE PLUS) liquid 237 mL  237 mL Oral TID BM Clapacs, John T, MD   237 mL at 02/26/21 1407   folic acid (FOLVITE) tablet 1 mg  1 mg Oral Daily Clapacs, Jackquline Denmark, MD   1 mg at 02/26/21 0841   gabapentin (NEURONTIN) capsule 300 mg  300 mg Oral TID Clapacs, Jackquline Denmark, MD   300 mg at 02/26/21 1717   HYDROcodone-acetaminophen (NORCO/VICODIN) 5-325 MG per tablet 1-2 tablet  1-2 tablet Oral Q6H PRN Jesse Sans, MD   2 tablet at 02/26/21 1824   hydrOXYzine (ATARAX/VISTARIL) tablet 25 mg  25 mg Oral TID PRN Clapacs, Jackquline Denmark, MD   25 mg at 02/26/21 1717   lidocaine (LIDODERM) 5 % 1 patch  1 patch Transdermal Q24H Jesse Sans, MD   1 patch at 02/13/21 2014   magnesium hydroxide (MILK OF MAGNESIA) suspension 30 mL  30 mL Oral Daily PRN  Clapacs, Jackquline Denmark, MD   30 mL at 02/01/21 1433   multivitamin with minerals tablet 1 tablet  1 tablet Oral Daily Clapacs, Jackquline Denmark, MD   1 tablet at 02/26/21 0841   ondansetron (ZOFRAN) injection 4 mg  4 mg Intravenous Once PRN Reed Breech, MD       ondansetron [facility]) tablet 8 mg  8 mg Oral Q8H PRN Jesse Sans, MD   8 mg at 02/11/21 2025   pantoprazole (PROTONIX) EC tablet 40 mg  40 mg Oral Daily Clapacs, Jackquline Denmark, MD   40 mg at 02/26/21 0841   thiamine tablet 100 mg  100 mg Oral Daily Jesse Sans, MD   100 mg at 02/26/21 0841   traZODone (DESYREL) tablet 100 mg  100 mg Oral QHS Beverly Sessions, MD   100 mg at 02/26/21 2100   PTA Medications: Medications Prior to Admission  Medication Sig Dispense Refill Last Dose   acetaminophen (TYLENOL) 325 MG tablet Take 2 tablets (650 mg total) by mouth every 6 (six) hours as needed for mild pain (pain score 1-3 or temp > 100.5).   02/07/2021   citalopram (CELEXA) 10 MG tablet Take 1 tablet (10 mg total) by mouth daily.   02/07/2021  feeding supplement (ENSURE ENLIVE / ENSURE PLUS) LIQD Take 237 mLs by mouth 3 (three) times daily between meals. 237 mL 12 [id number]   folic acid (FOLVITE) 1 MG tablet Take 1 tablet (1 mg total) by mouth daily. 30 tablet 0 02/07/2021   gabapentin (NEURONTIN) 300 MG capsule Take 1 capsule (300 mg total) by mouth 3 (three) times daily. 90 capsule 0 02/07/2021   HYDROcodone-acetaminophen (NORCO/VICODIN) 5-325 MG tablet Take 1-2 tablets by mouth every 6 (six) hours as needed for moderate pain or severe pain. 8 tablet 0 02/07/2021   hydrOXYzine (ATARAX/VISTARIL) 25 MG tablet Take 1 tablet (25 mg total) by mouth 3 (three) times daily as needed for anxiety. 30 tablet 0 02/07/2021   Multiple Vitamin (MULTIVITAMIN WITH MINERALS) TABS tablet Take 1 tablet by mouth daily. 30 tablet 0 02/07/2021   traZODone (DESYREL) 150 MG tablet Take 1 tablet (150 mg total) by mouth at bedtime. 30 tablet 0 02/07/2021   pantoprazole  (PROTONIX) 40 MG tablet Take 1 tablet (40 mg total) by mouth daily. 30 tablet 0     Patient Stressors: Substance abuse    Patient Strengths: Ability for insight  Capable of independent living  Communication skills  Motivation for treatment/growth  Supportive family/friends   Treatment Modalities: Medication Management, Group therapy, Case management,  1 to 1 session with clinician, Psychoeducation, Recreational therapy.   Physician Treatment Plan for Primary Diagnosis: Severe recurrent major depression without psychotic features ([place]) Long Term Goal(s): Improvement in symptoms so as ready for discharge   Short Term Goals: Ability to identify changes in lifestyle to reduce recurrence of condition will improve Ability to verbalize feelings will improve Ability to disclose and discuss suicidal ideas Ability to demonstrate self-control will improve Ability to identify and develop effective coping behaviors will improve Ability to maintain clinical measurements within normal limits will improve Compliance with prescribed medications will improve Ability to identify triggers associated with substance abuse/mental health issues will improve  Medication Management: Evaluate patient's response, side effects, and tolerance of medication regimen.  Therapeutic Interventions: 1 to 1 sessions, Unit Group sessions and Medication administration.  Evaluation of Outcomes: Adequate for Discharge  Physician Treatment Plan for Secondary Diagnosis: Principal Problem:   Severe recurrent major depression without psychotic features ([place]) Active Problems:   HTN (hypertension)   Alcohol abuse   Weakness   Hyponatremia  Long Term Goal(s): Improvement in symptoms so as ready for discharge   Short Term Goals: Ability to identify changes in lifestyle to reduce recurrence of condition will improve Ability to verbalize feelings will improve Ability to disclose and discuss suicidal ideas Ability to  demonstrate self-control will improve Ability to identify and develop effective coping behaviors will improve Ability to maintain clinical measurements within normal limits will improve Compliance with prescribed medications will improve Ability to identify triggers associated with substance abuse/mental health issues will improve     Medication Management: Evaluate patient's response, side effects, and tolerance of medication regimen.  Therapeutic Interventions: 1 to 1 sessions, Unit Group sessions and Medication administration.  Evaluation of Outcomes: Adequate for Discharge   RN Treatment Plan for Primary Diagnosis: Severe recurrent major depression without psychotic features ([place]) Long Term Goal(s): Knowledge of disease and therapeutic regimen to maintain health will improve  Short Term Goals: Ability to remain free from injury will improve, Ability to verbalize frustration and anger appropriately will improve, Ability to demonstrate self-control, Ability to participate in decision making will improve, Ability to verbalize feelings will improve, Ability to disclose and  discuss suicidal ideas, Ability to identify and develop effective coping behaviors will improve, and Compliance with prescribed medications will improve  Medication Management: RN will administer medications as ordered by provider, will assess and evaluate patient's response and provide education to patient for prescribed medication. RN will report any adverse and/or side effects to prescribing provider.  Therapeutic Interventions: 1 on 1 counseling sessions, Psychoeducation, Medication administration, Evaluate responses to treatment, Monitor vital signs and CBGs as ordered, Perform/monitor CIWA, COWS, AIMS and Fall Risk screenings as ordered, Perform wound care treatments as ordered.  Evaluation of Outcomes: Adequate for Discharge   LCSW Treatment Plan for Primary Diagnosis: Severe recurrent major depression without  psychotic features ([place]) Long Term Goal(s): Safe transition to appropriate next level of care at discharge, Engage patient in therapeutic group addressing interpersonal concerns.  Short Term Goals: Engage patient in aftercare planning with referrals and resources, Increase social support, Increase ability to appropriately verbalize feelings, Increase emotional regulation, Facilitate acceptance of mental health diagnosis and concerns, Facilitate patient progression through stages of change regarding substance use diagnoses and concerns, Identify triggers associated with mental health/substance abuse issues, and Increase skills for wellness and recovery  Therapeutic Interventions: Assess for all discharge needs, 1 to 1 time with Social worker, Explore available resources and support systems, Assess for adequacy in community support network, Educate family and significant other(s) on suicide prevention, Complete Psychosocial Assessment, Interpersonal group therapy.  Evaluation of Outcomes: Adequate for Discharge   Progress in Treatment: Attending groups: No. Participating in groups: No. Taking medication as prescribed: Yes. Toleration medication: Yes. Family/Significant other contact made: No, will contact:  Patient refused consent for CSW to reach collateral  Patient understands diagnosis: Yes. Discussing patient identified problems/goals with staff: Yes. Medical problems stabilized or resolved: Yes. Denies suicidal/homicidal ideation: Yes. Issues/concerns per patient self-inventory: Yes. Other: none   New problem(s) identified: No, Describe:  No additional problems identified at this time.   New Short Term/Long Term Goal(s): Patient to continue working towards elimination of symptoms of psychosis, medication management for mood stabilization; elimination of SI thoughts; development of comprehensive mental wellness plan in the outpatient setting.   Patient Goals:  No additional goals  identified at this time.   Discharge Plan or Barriers: Patient has decided to appeal his discharge CSW awaiting results.   Reason for Continuation of Hospitalization: Depression  Estimated Length of Stay: Adequate for discharge.    Scribe for Treatment Team: Joyce, [place] 02/27/2021 8:17 AM

## 2021-03-06 ENCOUNTER — Other Ambulatory Visit: Payer: Self-pay

## 2021-03-06 ENCOUNTER — Encounter: Payer: Self-pay | Admitting: Emergency Medicine

## 2021-03-06 ENCOUNTER — Emergency Department
Admission: EM | Admit: 2021-03-06 | Discharge: 2021-03-06 | Disposition: A | Discharge status: Home / Self Care | Payer: Medicare HMO | Attending: Emergency Medicine | Admitting: Emergency Medicine

## 2021-03-06 DIAGNOSIS — F1010 Alcohol abuse, uncomplicated: Secondary | ICD-10-CM | POA: Diagnosis not present

## 2021-03-06 DIAGNOSIS — F32A Depression, unspecified: Secondary | ICD-10-CM | POA: Diagnosis not present

## 2021-03-06 DIAGNOSIS — F17210 Nicotine dependence, cigarettes, uncomplicated: Secondary | ICD-10-CM | POA: Diagnosis not present

## 2021-03-06 DIAGNOSIS — N189 Chronic kidney disease, unspecified: Secondary | ICD-10-CM | POA: Diagnosis not present

## 2021-03-06 DIAGNOSIS — Z8616 Personal history of COVID-19: Secondary | ICD-10-CM | POA: Insufficient documentation

## 2021-03-06 DIAGNOSIS — R45851 Suicidal ideations: Secondary | ICD-10-CM

## 2021-03-06 DIAGNOSIS — Y902 Blood alcohol level of 40-59 mg/100 ml: Secondary | ICD-10-CM | POA: Insufficient documentation

## 2021-03-06 DIAGNOSIS — F419 Anxiety disorder, unspecified: Secondary | ICD-10-CM | POA: Diagnosis present

## 2021-03-06 DIAGNOSIS — Z79899 Other long term (current) drug therapy: Secondary | ICD-10-CM | POA: Insufficient documentation

## 2021-03-06 DIAGNOSIS — F1020 Alcohol dependence, uncomplicated: Secondary | ICD-10-CM | POA: Diagnosis not present

## 2021-03-06 DIAGNOSIS — I129 Hypertensive chronic kidney disease with stage 1 through stage 4 chronic kidney disease, or unspecified chronic kidney disease: Secondary | ICD-10-CM | POA: Diagnosis not present

## 2021-03-06 LAB — CBC WITH DIFFERENTIAL/PLATELET
Abs Immature Granulocytes: 0.03 10*3/uL (ref 0.00–0.07)
Basophils Absolute: 0.1 10*3/uL (ref 0.0–0.1)
Basophils Relative: 1 %
Eosinophils Absolute: 0.5 10*3/uL (ref 0.0–0.5)
Eosinophils Relative: 5 %
HCT: 41.1 % (ref 39.0–52.0)
Hemoglobin: 13.9 g/dL (ref 13.0–17.0)
Immature Granulocytes: 0 %
Lymphocytes Relative: 22 %
Lymphs Abs: 2 10*3/uL (ref 0.7–4.0)
MCH: 29.8 pg (ref 26.0–34.0)
MCHC: 33.8 g/dL (ref 30.0–36.0)
MCV: 88 fL (ref 80.0–100.0)
Monocytes Absolute: 0.8 10*3/uL (ref 0.1–1.0)
Monocytes Relative: 8 %
Neutro Abs: 5.8 10*3/uL (ref 1.7–7.7)
Neutrophils Relative %: 64 %
Platelets: 314 10*3/uL (ref 150–400)
RBC: 4.67 MIL/uL (ref 4.22–5.81)
RDW: 13.6 % (ref 11.5–15.5)
WBC: 9.2 10*3/uL (ref 4.0–10.5)
nRBC: 0 % (ref 0.0–0.2)

## 2021-03-06 LAB — COMPREHENSIVE METABOLIC PANEL
ALT: 65 U/L — ABNORMAL HIGH (ref 0–44)
AST: 69 U/L — ABNORMAL HIGH (ref 15–41)
Albumin: 4.3 g/dL (ref 3.5–5.0)
Alkaline Phosphatase: 130 U/L — ABNORMAL HIGH (ref 38–126)
Anion gap: 14 (ref 5–15)
BUN: 16 mg/dL (ref 8–23)
CO2: 20 mmol/L — ABNORMAL LOW (ref 22–32)
Calcium: 9.6 mg/dL (ref 8.9–10.3)
Chloride: 99 mmol/L (ref 98–111)
Creatinine, Ser: 0.71 mg/dL (ref 0.61–1.24)
GFR, Estimated: >60 mL/min (ref >60)
Glucose, Bld: 104 mg/dL — ABNORMAL HIGH (ref 70–99)
Potassium: 4 mmol/L (ref 3.5–5.1)
Sodium: 133 mmol/L — ABNORMAL LOW (ref 135–145)
Total Bilirubin: 0.4 mg/dL (ref 0.3–1.2)
Total Protein: 7.5 g/dL (ref 6.5–8.1)

## 2021-03-06 LAB — ETHANOL: Alcohol, Ethyl (B): 57 mg/dL — ABNORMAL HIGH (ref <10)

## 2021-03-06 LAB — URINE DRUG SCREEN, QUALITATIVE (ARMC ONLY)
Amphetamines, Ur Screen: NOT DETECTED
Barbiturates, Ur Screen: NOT DETECTED
Benzodiazepine, Ur Scrn: NOT DETECTED
Cannabinoid 50 Ng, Ur ~~LOC~~: NOT DETECTED
Cocaine Metabolite,Ur ~~LOC~~: NOT DETECTED
MDMA (Ecstasy)Ur Screen: NOT DETECTED
Methadone Scn, Ur: NOT DETECTED
Opiate, Ur Screen: NOT DETECTED
Phencyclidine (PCP) Ur S: NOT DETECTED
Tricyclic, Ur Screen: NOT DETECTED

## 2021-03-06 LAB — SALICYLATE LEVEL
Salicylate Lvl: 8.7 mg/dL (ref 7.0–30.0)
Salicylate Lvl: <7 mg/dL — ABNORMAL LOW (ref 7.0–30.0)

## 2021-03-06 LAB — ACETAMINOPHEN LEVEL: Acetaminophen (Tylenol), Serum: <10 ug/mL — ABNORMAL LOW (ref 10–30)

## 2021-03-06 MED ORDER — CHLORDIAZEPOXIDE HCL 25 MG PO CAPS
25.0000 mg | ORAL_CAPSULE | Freq: Three times a day (TID) | ORAL | Status: DC
  Administered 2021-03-06 (×3): 25 mg via ORAL
  Filled 2021-03-06 (×3): qty 1

## 2021-03-06 MED ORDER — HYDROXYZINE HCL 25 MG PO TABS
25.0000 mg | ORAL_TABLET | Freq: Once | ORAL | Status: AC
  Administered 2021-03-06: 25 mg via ORAL
  Filled 2021-03-06: qty 1

## 2021-03-06 MED ORDER — ACETAMINOPHEN 500 MG PO TABS
1000.0000 mg | ORAL_TABLET | Freq: Once | ORAL | Status: AC
  Administered 2021-03-06: 1000 mg via ORAL
  Filled 2021-03-06: qty 2

## 2021-03-06 NOTE — ED Triage Notes (Signed)
Pt to ED from home brought in by [employer] voluntarily c/o SI, states would cut self.  Denies HI, denies A/V hallucinations.  States chronic left hip pain, uses a cane.  States drank 6 beers and pint of liquor today last drink around 2100, states drinks every day same amount and has had withdrawals.  Pt cooperative in triage.  Pt dressed into hospital appropriate scrubs by this RN and Vernona Rieger, EDT.  Belongings placed into 1 belongings bag which include: 1 pair white shoes, 1 pair gray pants, 1 blue jacket, cigarettes, cell phone, 1 brief, cell phone charger, 1 silver color cane.

## 2021-03-06 NOTE — ED Notes (Signed)
Pt given breakfast tray

## 2021-03-06 NOTE — BH Assessment (Signed)
Comprehensive Clinical Assessment (CCA) Note  03/06/2021 Chad [name] [id number]  [name], 70 year old male who presents to [facility] ED involuntarily for treatment. Per triage note, Pt to ED from home brought in by [employer] voluntarily c/o SI, states would cut self.  Denies HI, denies A/V hallucinations. States chronic left hip pain uses a cane.  States drank 6 beers and pint of liquor today last drink around 2100, states he drinks every day same amount and has had withdrawals.  Pt cooperative in triage.   During TTS assessment pt presents alert and oriented x 4, restless but cooperative, and mood-congruent with affect. The pt does not appear to be responding to internal or external stimuli. Neither is the pt presenting with any delusional thinking. Pt verified the information provided to triage RN.   Pt identifies his main complaint to be that "the pain is an issue." Patient reports 6 months ago he fell and broke his hip. Patient states the pain is unbearable and he feels as though he is alone, and no one wants him around. Patient recently discharged from [facility] about a week ago and was unable to follow up with getting his prescriptions due to not having a means of transportation. Patient also stated that he does not have food in his apartment because he does not receive help nor has, he been provided with any resources. Patient reports going to ECT during his inpatient stay and states it was not helpful. "It didn't work because I am back." Patient states he lives alone in his apartment where other family members reside as well. Patient reports drinking a pint alcohol daily and says he self-medicates because nothing else works. Patient does not take any psych medications and does not see a therapist for OUTPT tx. TTS offered resources for additional support and suggested patient link up with social work for assistance with food and transportation services. Pt reports passive SI. Patient denies HI/AH/VH.     Per Sallye Ober, NP pt does not meet criteria for inpatient psychiatric admission.  Chief Complaint:  Chief Complaint  Patient presents with   Suicidal   Visit Diagnosis: Alcohol abuse    CCA Screening, Triage and Referral (STR)  Patient Reported Information How did you hear about [place]? -- [employer])  Referral name: No data recorded Referral phone number: No data recorded  Whom do you see for routine medical problems? No data recorded Practice/Facility Name: No data recorded Practice/Facility Phone Number: No data recorded Name of Contact: No data recorded Contact Number: No data recorded Contact Fax Number: No data recorded Prescriber Name: No data recorded Prescriber Address (if known): No data recorded  What Is the Reason for Your Visit/Call Today? Patient reports he has chronic pain in his  How Long Has This Been Causing You Problems? 1 wk - 1 month  What Do You Feel Would Help You the Most Today? Alcohol or Drug Use Treatment; Treatment for Depression or other mood problem   Have You Recently Been in Any Inpatient Treatment (Hospital/Detox/Crisis Center/28-Day Program)? No data recorded Name/Location of Program/Hospital:No data recorded How Long Were You There? No data recorded When Were You Discharged? No data recorded  Have You Ever Received Services From [facility] Before? No data recorded Who Do You See at [facility]? No data recorded  Have You Recently Had Any Thoughts About Hurting Yourself? Yes  Are You Planning to Commit Suicide/Harm Yourself At This time? No   Have you Recently Had Thoughts About Hurting Someone Karolee Ohs?  No  Explanation: No data recorded  Have You Used Any Alcohol or Drugs in the Past 24 Hours? Yes  How Long Ago Did You Use Drugs or Alcohol? No data recorded What Did You Use and How Much? A pint of alcohol   Do You Currently Have a Therapist/Psychiatrist? No  Name of Therapist/Psychiatrist: No data recorded  Have You Been  Recently Discharged From Any Office Practice or Programs? Yes  Explanation of Discharge From Practice/Program: [facility]- discharge     CCA Screening Triage Referral Assessment Type of Contact: Face-to-Face  Is this Initial or Reassessment? No data recorded Date Telepsych consult ordered in [facility]:  No data recorded Time Telepsych consult ordered in [facility]:  No data recorded  Patient Reported Information Reviewed? No data recorded Patient Left Without Being Seen? No data recorded Reason for Not Completing Assessment: No data recorded  Collateral Involvement: None provided   Does Patient Have a Court Appointed Legal Guardian? No data recorded Name and Contact of Legal Guardian: No data recorded If Minor and Not Living with Parent(s), Who has Custody? n/a  Is CPS involved or ever been involved? Never  Is APS involved or ever been involved? Never   Patient Determined To Be At Risk for Harm To Self or Others Based on Review of Patient Reported Information or Presenting Complaint? No  Method: No data recorded Availability of Means: No data recorded Intent: No data recorded Notification Required: No data recorded Additional Information for Danger to Others Potential: No data recorded Additional Comments for Danger to Others Potential: No data recorded Are There Guns or Other Weapons in Your Home? No data recorded Types of Guns/Weapons: No data recorded Are These Weapons Safely Secured?                            No data recorded Who Could Verify You Are Able To Have These Secured: No data recorded Do You Have any Outstanding Charges, Pending Court Dates, Parole/Probation? No data recorded Contacted To Inform of Risk of Harm To Self or Others: No data recorded  Location of Assessment: [facility] ED   Does Patient Present under Involuntary Commitment? No  IVC Papers Initial File Date: No data recorded  [place] of Residence: [place]   Patient Currently Receiving the Following Services:  Not Receiving Services   Determination of Need: Urgent (48 hours)   Options For Referral: ED Visit; Medication Management; Outpatient Therapy  Recommendations for Services/Supports/Treatments:    DSM5 Diagnoses: Patient Active Problem List   Diagnosis Date Noted   Severe recurrent major depression without psychotic features (HCC) 01/28/2021   Hyponatremia 01/26/2021   Failure to thrive in adult    Secondary adrenal insufficiency (HCC)    Malnutrition of moderate degree 12/28/2020   COVID-19 12/24/2020   Physical deconditioning 12/22/2020   Nicotine dependence 09/27/2020   Weakness 02/04/2020   Alcohol abuse with intoxication delirium (HCC)    Chest pain    Alcohol abuse 02/20/2019   Alcohol dependence with uncomplicated withdrawal (HCC) 01/23/2019   Alcohol-induced mood disorder (HCC) 01/23/2019   Depression 01/23/2019   Wernicke's encephalopathy 10/18/2018   Acute alcoholic liver disease 10/16/2018   Severe protein-calorie malnutrition (HCC) 10/16/2018   HTN (hypertension) 05/17/2018   Alcohol withdrawal (HCC) 05/17/2018   DVT (deep venous thrombosis) (HCC) 05/17/2018   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   Primary osteoarthritis of both hands 12/29/2017   Psoriasis 12/29/2017   Hepatitis C antibody test positive 10/31/2013  BPH (benign prostatic hyperplasia) 12/15/2011   ED (erectile dysfunction) 12/12/2011    Patient Centered Plan: Patient is on the following Treatment Plan(s):  Substance Abuse   Referrals to Alternative Service(s): Referred to Alternative Service(s):   Place:   Date:   Time:    Referred to Alternative Service(s):   Place:   Date:   Time:    Referred to Alternative Service(s):   Place:   Date:   Time:    Referred to Alternative Service(s):   Place:   Date:   Time:     Claris Pech Dierdre Searles, Counselor, LCAS-A

## 2021-03-06 NOTE — Discharge Instructions (Signed)
You have been seen in the emergency department for a  psychiatric concern. You have been evaluated both medically as well as psychiatrically. Please follow-up with your outpatient resources provided. Return to the emergency department for any worsening symptoms, or any thoughts of hurting yourself or anyone else so that we may attempt to help you. 
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
## 2021-03-06 NOTE — ED Notes (Signed)
Pt c/o unchanged pain. MD aware.

## 2021-03-06 NOTE — ED Notes (Addendum)
Pt requesting something for hip pain. No PRN orders at this time. MD made aware.

## 2021-03-06 NOTE — ED Provider Notes (Signed)
-----------------------------------------   2:50 PM on 03/06/2021 ----------------------------------------- Patient has been seen and evaluated by psychiatry.  They believe the patient is safe for discharge home from a psychiatric standpoint.  Patient's medical work-up has been largely nonrevealing.  Patient will be discharged with outpatient resources.   Minna Antis, MD 03/06/21 1451
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
## 2021-03-06 NOTE — ED Notes (Signed)
NP at bedside for assessment

## 2021-03-06 NOTE — ED Notes (Signed)
Pt given lunch tray and a cup of sprite.  

## 2021-03-31 ENCOUNTER — Emergency Department: Payer: Medicare HMO

## 2021-03-31 ENCOUNTER — Inpatient Hospital Stay
Admission: EM | Admit: 2021-03-31 | Discharge: 2021-04-05 | DRG: 377 | Disposition: A | Discharge status: Other Acute Inpatient Hospital | Payer: Medicare HMO | Attending: Internal Medicine | Admitting: Internal Medicine

## 2021-03-31 ENCOUNTER — Other Ambulatory Visit: Payer: Self-pay

## 2021-03-31 ENCOUNTER — Encounter: Payer: Self-pay | Admitting: Emergency Medicine

## 2021-03-31 DIAGNOSIS — K2980 Duodenitis without bleeding: Secondary | ICD-10-CM | POA: Diagnosis present

## 2021-03-31 DIAGNOSIS — F32A Depression, unspecified: Secondary | ICD-10-CM | POA: Diagnosis not present

## 2021-03-31 DIAGNOSIS — E871 Hypo-osmolality and hyponatremia: Secondary | ICD-10-CM | POA: Diagnosis present

## 2021-03-31 DIAGNOSIS — T17908S Unspecified foreign body in respiratory tract, part unspecified causing other injury, sequela: Secondary | ICD-10-CM | POA: Diagnosis not present

## 2021-03-31 DIAGNOSIS — K209 Esophagitis, unspecified without bleeding: Secondary | ICD-10-CM

## 2021-03-31 DIAGNOSIS — M79605 Pain in left leg: Secondary | ICD-10-CM

## 2021-03-31 DIAGNOSIS — Y900 Blood alcohol level of less than 20 mg/100 ml: Secondary | ICD-10-CM | POA: Diagnosis present

## 2021-03-31 DIAGNOSIS — F1010 Alcohol abuse, uncomplicated: Secondary | ICD-10-CM | POA: Diagnosis present

## 2021-03-31 DIAGNOSIS — M79604 Pain in right leg: Secondary | ICD-10-CM

## 2021-03-31 DIAGNOSIS — K922 Gastrointestinal hemorrhage, unspecified: Secondary | ICD-10-CM | POA: Diagnosis present

## 2021-03-31 DIAGNOSIS — Z6823 Body mass index (BMI) 23.0-23.9, adult: Secondary | ICD-10-CM

## 2021-03-31 DIAGNOSIS — N400 Enlarged prostate without lower urinary tract symptoms: Secondary | ICD-10-CM | POA: Diagnosis present

## 2021-03-31 DIAGNOSIS — F17210 Nicotine dependence, cigarettes, uncomplicated: Secondary | ICD-10-CM | POA: Diagnosis present

## 2021-03-31 DIAGNOSIS — F419 Anxiety disorder, unspecified: Secondary | ICD-10-CM | POA: Diagnosis present

## 2021-03-31 DIAGNOSIS — J189 Pneumonia, unspecified organism: Secondary | ICD-10-CM

## 2021-03-31 DIAGNOSIS — I472 Ventricular tachycardia, unspecified: Secondary | ICD-10-CM | POA: Diagnosis not present

## 2021-03-31 DIAGNOSIS — K219 Gastro-esophageal reflux disease without esophagitis: Secondary | ICD-10-CM | POA: Diagnosis present

## 2021-03-31 DIAGNOSIS — J690 Pneumonitis due to inhalation of food and vomit: Secondary | ICD-10-CM

## 2021-03-31 DIAGNOSIS — K449 Diaphragmatic hernia without obstruction or gangrene: Secondary | ICD-10-CM | POA: Diagnosis present

## 2021-03-31 DIAGNOSIS — K2920 Alcoholic gastritis without bleeding: Secondary | ICD-10-CM | POA: Diagnosis present

## 2021-03-31 DIAGNOSIS — K319 Disease of stomach and duodenum, unspecified: Secondary | ICD-10-CM | POA: Diagnosis present

## 2021-03-31 DIAGNOSIS — Z886 Allergy status to analgesic agent status: Secondary | ICD-10-CM

## 2021-03-31 DIAGNOSIS — K2210 Ulcer of esophagus without bleeding: Secondary | ICD-10-CM

## 2021-03-31 DIAGNOSIS — F17200 Nicotine dependence, unspecified, uncomplicated: Secondary | ICD-10-CM | POA: Diagnosis present

## 2021-03-31 DIAGNOSIS — R531 Weakness: Secondary | ICD-10-CM | POA: Diagnosis not present

## 2021-03-31 DIAGNOSIS — K920 Hematemesis: Secondary | ICD-10-CM | POA: Diagnosis present

## 2021-03-31 DIAGNOSIS — Z79891 Long term (current) use of opiate analgesic: Secondary | ICD-10-CM

## 2021-03-31 DIAGNOSIS — E8729 Other acidosis: Secondary | ICD-10-CM | POA: Diagnosis present

## 2021-03-31 DIAGNOSIS — K269 Duodenal ulcer, unspecified as acute or chronic, without hemorrhage or perforation: Secondary | ICD-10-CM | POA: Diagnosis not present

## 2021-03-31 DIAGNOSIS — K2100 Gastro-esophageal reflux disease with esophagitis, without bleeding: Secondary | ICD-10-CM | POA: Diagnosis present

## 2021-03-31 DIAGNOSIS — F10230 Alcohol dependence with withdrawal, uncomplicated: Secondary | ICD-10-CM | POA: Diagnosis present

## 2021-03-31 DIAGNOSIS — I10 Essential (primary) hypertension: Secondary | ICD-10-CM | POA: Diagnosis present

## 2021-03-31 DIAGNOSIS — K267 Chronic duodenal ulcer without hemorrhage or perforation: Secondary | ICD-10-CM | POA: Diagnosis present

## 2021-03-31 DIAGNOSIS — Z20822 Contact with and (suspected) exposure to covid-19: Secondary | ICD-10-CM | POA: Diagnosis present

## 2021-03-31 DIAGNOSIS — R627 Adult failure to thrive: Secondary | ICD-10-CM | POA: Diagnosis present

## 2021-03-31 DIAGNOSIS — T17908A Unspecified foreign body in respiratory tract, part unspecified causing other injury, initial encounter: Secondary | ICD-10-CM | POA: Diagnosis present

## 2021-03-31 DIAGNOSIS — K279 Peptic ulcer, site unspecified, unspecified as acute or chronic, without hemorrhage or perforation: Secondary | ICD-10-CM | POA: Diagnosis not present

## 2021-03-31 DIAGNOSIS — F332 Major depressive disorder, recurrent severe without psychotic features: Secondary | ICD-10-CM | POA: Diagnosis present

## 2021-03-31 DIAGNOSIS — K760 Fatty (change of) liver, not elsewhere classified: Secondary | ICD-10-CM | POA: Diagnosis present

## 2021-03-31 DIAGNOSIS — Z8616 Personal history of COVID-19: Secondary | ICD-10-CM | POA: Diagnosis not present

## 2021-03-31 DIAGNOSIS — R45851 Suicidal ideations: Secondary | ICD-10-CM

## 2021-03-31 DIAGNOSIS — Z716 Tobacco abuse counseling: Secondary | ICD-10-CM

## 2021-03-31 DIAGNOSIS — Z86718 Personal history of other venous thrombosis and embolism: Secondary | ICD-10-CM

## 2021-03-31 DIAGNOSIS — R079 Chest pain, unspecified: Secondary | ICD-10-CM

## 2021-03-31 DIAGNOSIS — K257 Chronic gastric ulcer without hemorrhage or perforation: Secondary | ICD-10-CM | POA: Diagnosis present

## 2021-03-31 DIAGNOSIS — E2740 Unspecified adrenocortical insufficiency: Secondary | ICD-10-CM | POA: Diagnosis present

## 2021-03-31 DIAGNOSIS — Z885 Allergy status to narcotic agent status: Secondary | ICD-10-CM

## 2021-03-31 DIAGNOSIS — Z79899 Other long term (current) drug therapy: Secondary | ICD-10-CM

## 2021-03-31 DIAGNOSIS — K264 Chronic or unspecified duodenal ulcer with hemorrhage: Secondary | ICD-10-CM | POA: Diagnosis not present

## 2021-03-31 DIAGNOSIS — Z9049 Acquired absence of other specified parts of digestive tract: Secondary | ICD-10-CM

## 2021-03-31 DIAGNOSIS — K253 Acute gastric ulcer without hemorrhage or perforation: Secondary | ICD-10-CM | POA: Diagnosis not present

## 2021-03-31 DIAGNOSIS — Z811 Family history of alcohol abuse and dependence: Secondary | ICD-10-CM

## 2021-03-31 LAB — HEMOGLOBIN AND HEMATOCRIT, BLOOD
HCT: 34.7 % — ABNORMAL LOW (ref 39.0–52.0)
HCT: 33.7 % — ABNORMAL LOW (ref 39.0–52.0)
HCT: 32.2 % — ABNORMAL LOW (ref 39.0–52.0)
Hemoglobin: 11.8 g/dL — ABNORMAL LOW (ref 13.0–17.0)
Hemoglobin: 11.4 g/dL — ABNORMAL LOW (ref 13.0–17.0)
Hemoglobin: 11.1 g/dL — ABNORMAL LOW (ref 13.0–17.0)

## 2021-03-31 LAB — COMPREHENSIVE METABOLIC PANEL
ALT: 37 U/L (ref 0–44)
AST: 62 U/L — ABNORMAL HIGH (ref 15–41)
Albumin: 3.7 g/dL (ref 3.5–5.0)
Alkaline Phosphatase: 132 U/L — ABNORMAL HIGH (ref 38–126)
Anion gap: 18 — ABNORMAL HIGH (ref 5–15)
BUN: 7 mg/dL — ABNORMAL LOW (ref 8–23)
CO2: 16 mmol/L — ABNORMAL LOW (ref 22–32)
Calcium: 8.6 mg/dL — ABNORMAL LOW (ref 8.9–10.3)
Chloride: 95 mmol/L — ABNORMAL LOW (ref 98–111)
Creatinine, Ser: 0.54 mg/dL — ABNORMAL LOW (ref 0.61–1.24)
GFR, Estimated: >60 mL/min (ref >60)
Glucose, Bld: 108 mg/dL — ABNORMAL HIGH (ref 70–99)
Potassium: 4.1 mmol/L (ref 3.5–5.1)
Sodium: 129 mmol/L — ABNORMAL LOW (ref 135–145)
Total Bilirubin: 1.3 mg/dL — ABNORMAL HIGH (ref 0.3–1.2)
Total Protein: 6.2 g/dL — ABNORMAL LOW (ref 6.5–8.1)

## 2021-03-31 LAB — CBC WITH DIFFERENTIAL/PLATELET
Abs Immature Granulocytes: 0.06 K/uL (ref 0.00–0.07)
Basophils Absolute: 0.1 K/uL (ref 0.0–0.1)
Basophils Relative: 1 %
Eosinophils Absolute: 0.2 K/uL (ref 0.0–0.5)
Eosinophils Relative: 4 %
HCT: 40.6 % (ref 39.0–52.0)
Hemoglobin: 13.8 g/dL (ref 13.0–17.0)
Immature Granulocytes: 1 %
Lymphocytes Relative: 23 %
Lymphs Abs: 1.5 K/uL (ref 0.7–4.0)
MCH: 29.7 pg (ref 26.0–34.0)
MCHC: 34 g/dL (ref 30.0–36.0)
MCV: 87.3 fL (ref 80.0–100.0)
Monocytes Absolute: 0.7 K/uL (ref 0.1–1.0)
Monocytes Relative: 11 %
Neutro Abs: 3.9 K/uL (ref 1.7–7.7)
Neutrophils Relative %: 60 %
Platelets: 171 K/uL (ref 150–400)
RBC: 4.65 MIL/uL (ref 4.22–5.81)
RDW: 14.7 % (ref 11.5–15.5)
WBC: 6.5 K/uL (ref 4.0–10.5)
nRBC: 0 % (ref 0.0–0.2)

## 2021-03-31 LAB — BASIC METABOLIC PANEL
Anion gap: 7 (ref 5–15)
BUN: 8 mg/dL (ref 8–23)
CO2: 26 mmol/L (ref 22–32)
Calcium: 8.2 mg/dL — ABNORMAL LOW (ref 8.9–10.3)
Chloride: 98 mmol/L (ref 98–111)
Creatinine, Ser: 0.65 mg/dL (ref 0.61–1.24)
GFR, Estimated: >60 mL/min (ref >60)
Glucose, Bld: 118 mg/dL — ABNORMAL HIGH (ref 70–99)
Potassium: 4.3 mmol/L (ref 3.5–5.1)
Sodium: 131 mmol/L — ABNORMAL LOW (ref 135–145)

## 2021-03-31 LAB — TROPONIN I (HIGH SENSITIVITY): Troponin I (High Sensitivity): 10 ng/L (ref <18)

## 2021-03-31 LAB — RESP PANEL BY RT-PCR (FLU A&B, COVID) ARPGX2
Influenza A by PCR: NEGATIVE
Influenza B by PCR: NEGATIVE
SARS Coronavirus 2 by RT PCR: NEGATIVE

## 2021-03-31 LAB — TYPE AND SCREEN
ABO/RH(D): A POS
Antibody Screen: NEGATIVE

## 2021-03-31 LAB — LIPASE, BLOOD: Lipase: 25 U/L (ref 11–51)

## 2021-03-31 LAB — PROTIME-INR
INR: 0.9 (ref 0.8–1.2)
Prothrombin Time: 12.5 seconds (ref 11.4–15.2)

## 2021-03-31 LAB — MAGNESIUM: Magnesium: 1.8 mg/dL (ref 1.7–2.4)

## 2021-03-31 LAB — ETHANOL: Alcohol, Ethyl (B): 13 mg/dL — ABNORMAL HIGH (ref <10)

## 2021-03-31 LAB — PROCALCITONIN: Procalcitonin: <0.1 ng/mL

## 2021-03-31 MED ORDER — THIAMINE HCL 100 MG PO TABS
100.0000 mg | ORAL_TABLET | Freq: Every day | ORAL | Status: DC
  Administered 2021-03-31 – 2021-04-05 (×5): 100 mg via ORAL
  Filled 2021-03-31 (×5): qty 1

## 2021-03-31 MED ORDER — MAGNESIUM SULFATE 2 GM/50ML IV SOLN
2.0000 g | Freq: Once | INTRAVENOUS | Status: AC
  Administered 2021-03-31: 2 g via INTRAVENOUS
  Filled 2021-03-31: qty 50

## 2021-03-31 MED ORDER — ONDANSETRON HCL 4 MG/2ML IJ SOLN: 4.0000 mg | Freq: Four times a day (QID) | INTRAMUSCULAR | Status: DC | PRN

## 2021-03-31 MED ORDER — PANTOPRAZOLE SODIUM 40 MG IV SOLR: 40.0000 mg | Freq: Two times a day (BID) | INTRAVENOUS | Status: DC

## 2021-03-31 MED ORDER — SODIUM CHLORIDE 0.9 % IV SOLN
3.0000 g | Freq: Four times a day (QID) | INTRAVENOUS | Status: DC
  Administered 2021-03-31 – 2021-04-01 (×4): 3 g via INTRAVENOUS
  Filled 2021-03-31 (×9): qty 8

## 2021-03-31 MED ORDER — SODIUM CHLORIDE 0.9 % IV SOLN
1.0000 g | Freq: Once | INTRAVENOUS | Status: AC
  Administered 2021-03-31: 1 g via INTRAVENOUS
  Filled 2021-03-31: qty 10

## 2021-03-31 MED ORDER — LORAZEPAM 2 MG PO TABS
0.0000 mg | ORAL_TABLET | Freq: Two times a day (BID) | ORAL | Status: DC
  Administered 2021-04-03: 13:00:00 2 mg via ORAL
  Administered 2021-04-03: 02:00:00 1 mg via ORAL
  Filled 2021-03-31 (×2): qty 1

## 2021-03-31 MED ORDER — LORAZEPAM 2 MG/ML IJ SOLN: 0.0000 mg | Freq: Two times a day (BID) | INTRAMUSCULAR | Status: DC

## 2021-03-31 MED ORDER — FOLIC ACID 1 MG PO TABS
1.0000 mg | ORAL_TABLET | Freq: Every day | ORAL | Status: DC
  Administered 2021-03-31 – 2021-04-05 (×6): 1 mg via ORAL
  Filled 2021-03-31 (×6): qty 1

## 2021-03-31 MED ORDER — LORAZEPAM 2 MG PO TABS
0.0000 mg | ORAL_TABLET | Freq: Four times a day (QID) | ORAL | Status: DC
Start: 2021-03-31 — End: 2021-04-02
  Administered 2021-03-31: 1 mg via ORAL
  Administered 2021-03-31 – 2021-04-01 (×3): 2 mg via ORAL
  Filled 2021-03-31 (×4): qty 1

## 2021-03-31 MED ORDER — DEXTROSE-NACL 5-0.9 % IV SOLN: INTRAVENOUS | Status: DC

## 2021-03-31 MED ORDER — ENSURE ENLIVE PO LIQD
237.0000 mL | Freq: Two times a day (BID) | ORAL | Status: DC
  Administered 2021-03-31 – 2021-04-05 (×10): 237 mL via ORAL

## 2021-03-31 MED ORDER — SODIUM CHLORIDE 0.9 % IV SOLN
500.0000 mg | Freq: Once | INTRAVENOUS | Status: AC
  Administered 2021-03-31: 500 mg via INTRAVENOUS
  Filled 2021-03-31: qty 5

## 2021-03-31 MED ORDER — ENSURE ENLIVE PO LIQD: 237.0000 mL | Freq: Two times a day (BID) | ORAL | Status: DC

## 2021-03-31 MED ORDER — ONDANSETRON HCL 4 MG/2ML IJ SOLN
4.0000 mg | Freq: Once | INTRAMUSCULAR | Status: AC
  Administered 2021-03-31: 4 mg via INTRAVENOUS
  Filled 2021-03-31: qty 2

## 2021-03-31 MED ORDER — ONDANSETRON HCL 4 MG PO TABS: 4.0000 mg | ORAL_TABLET | Freq: Four times a day (QID) | ORAL | Status: DC | PRN

## 2021-03-31 MED ORDER — FENTANYL CITRATE PF 50 MCG/ML IJ SOSY
50.0000 ug | PREFILLED_SYRINGE | Freq: Once | INTRAMUSCULAR | Status: AC
  Administered 2021-03-31: 50 ug via INTRAVENOUS
  Filled 2021-03-31: qty 1

## 2021-03-31 MED ORDER — FOLIC ACID 5 MG/ML IJ SOLN: 1.0000 mg | Freq: Every day | INTRAMUSCULAR | Status: DC

## 2021-03-31 MED ORDER — ACETAMINOPHEN 325 MG PO TABS
650.0000 mg | ORAL_TABLET | Freq: Four times a day (QID) | ORAL | Status: DC | PRN
  Administered 2021-04-03: 650 mg via ORAL
  Filled 2021-03-31 (×2): qty 2

## 2021-03-31 MED ORDER — THIAMINE HCL 100 MG/ML IJ SOLN
100.0000 mg | Freq: Every day | INTRAMUSCULAR | Status: DC
  Administered 2021-04-01: 100 mg via INTRAVENOUS
  Filled 2021-03-31: qty 2

## 2021-03-31 MED ORDER — SODIUM CHLORIDE 0.9 % IV SOLN: Freq: Once | INTRAVENOUS | Status: AC

## 2021-03-31 MED ORDER — PANTOPRAZOLE INFUSION (NEW) - SIMPLE MED
8.0000 mg/h | INTRAVENOUS | Status: DC
  Administered 2021-03-31 – 2021-04-01 (×3): 8 mg/h via INTRAVENOUS
  Filled 2021-03-31 (×2): qty 80
  Filled 2021-03-31: qty 100
  Filled 2021-03-31: qty 80

## 2021-03-31 MED ORDER — IOHEXOL 350 MG/ML SOLN
100.0000 mL | Freq: Once | INTRAVENOUS | Status: AC | PRN
  Administered 2021-03-31: 100 mL via INTRAVENOUS

## 2021-03-31 MED ORDER — HALOPERIDOL LACTATE 5 MG/ML IJ SOLN
2.0000 mg | Freq: Once | INTRAMUSCULAR | Status: AC
  Administered 2021-03-31: 2 mg via INTRAVENOUS
  Filled 2021-03-31: qty 1

## 2021-03-31 MED ORDER — NICOTINE 14 MG/24HR TD PT24
14.0000 mg | MEDICATED_PATCH | Freq: Every day | TRANSDERMAL | Status: DC
  Administered 2021-03-31 – 2021-04-05 (×6): 14 mg via TRANSDERMAL
  Filled 2021-03-31 (×6): qty 1

## 2021-03-31 MED ORDER — LORAZEPAM 2 MG/ML IJ SOLN
0.0000 mg | Freq: Four times a day (QID) | INTRAMUSCULAR | Status: DC
  Administered 2021-03-31: 2 mg via INTRAVENOUS
  Administered 2021-04-01: 1 mg via INTRAVENOUS
  Filled 2021-03-31 (×2): qty 1

## 2021-03-31 MED ORDER — PANTOPRAZOLE 80MG IVPB - SIMPLE MED
80.0000 mg | Freq: Once | INTRAVENOUS | Status: AC
  Administered 2021-03-31: 80 mg via INTRAVENOUS
  Filled 2021-03-31: qty 80
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
## 2021-03-31 NOTE — ED Notes (Addendum)
RN found mulitple pills in bed with patient. And then found a pill bottle in the floor of citalopram. Unknown how much pt has taken. Bottle was filled 02/27/21 with 30 pills. There are 4 left in bottle. MD made aware. Pt is now more confused than earlier in the shift.

## 2021-03-31 NOTE — ED Notes (Signed)
RN to bedside. Pt was yelling help from the room. Pt was almost out of the bed at the end. Pt had pulled his IV out and all of his monitor cords. Pt placed in recliner at his request and nonskid socks placed.

## 2021-03-31 NOTE — ED Notes (Signed)
RN to bedside to introduce self. Pt CAOx4 and in no distress.
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
## 2021-03-31 NOTE — ED Notes (Signed)
Pt oxygen dropped to 88% on RA with snoring respirations. Pt readjusted and placed on 2LPM and oxygen improved and snoring went away.

## 2021-03-31 NOTE — ED Notes (Signed)
Pt had multiple runs of VTACH at 225.  MD made aware.

## 2021-03-31 NOTE — Progress Notes (Signed)
Pharmacy Antibiotic Note  Chad [name] is a 70 y.o. male admitted on 03/31/2021. Pharmacy has been consulted for Unasyn dosing.  Plan: Unasyn 3 g IV q6h  Height: 5\' 9"  (175.3 cm) Weight: 72.6 kg (160 lb) IBW/kg (Calculated) : 70.7  Temp (24hrs), Avg:97.8 F (36.6 C), Min:97.8 F (36.6 C), Max:97.8 F (36.6 C)  Recent Labs  Lab 03/31/21 0423  WBC 6.5  CREATININE 0.54*    Estimated Creatinine Clearance: 88.4 mL/min (A) (by C-G formula based on SCr of 0.54 mg/dL (L)).    Allergies  Allergen Reactions   Morphine And Related Other (See Comments)    Causes "bad disposition"   Ibuprofen Other (See Comments)    Gi upset    Antimicrobials this admission: Unasyn 12/11 >> Ceftriaxone/azithromycin 12/11 x 1   Thank you for allowing pharmacy to be a part of this patient's care.  14/11, PharmD, BCPS Clinical Pharmacist 03/31/2021 12:11 PM

## 2021-03-31 NOTE — ED Triage Notes (Signed)
Pt to ED via [facility] with c/o hematemesis x 2 days, per EMS pt also c/o upper abd pain, pt with hx of cirrhosis of the liver. Per EMS pt with pain 9-10/10.    150/90 99Hr 97% 18RR

## 2021-03-31 NOTE — ED Notes (Signed)
RN to bedside to answer callbell.
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
## 2021-03-31 NOTE — ED Triage Notes (Addendum)
Pt endorses vomiting a lot of dark red blood, pt states unable to count the number of times he has thrown up blood in the last few days. Pt also c/o upper abd pain at this time. Pt with noted abd distention and swelling at this time. Pt endorses last drink 1900 on 12/10.

## 2021-04-01 ENCOUNTER — Encounter
Admission: EM | Disposition: A | Discharge status: Other Acute Inpatient Hospital | Payer: Self-pay | Source: Home / Self Care | Attending: Internal Medicine

## 2021-04-01 ENCOUNTER — Inpatient Hospital Stay: Payer: Medicare HMO | Admitting: Certified Registered"

## 2021-04-01 DIAGNOSIS — E8729 Other acidosis: Secondary | ICD-10-CM

## 2021-04-01 DIAGNOSIS — F32A Depression, unspecified: Secondary | ICD-10-CM

## 2021-04-01 DIAGNOSIS — K2210 Ulcer of esophagus without bleeding: Secondary | ICD-10-CM

## 2021-04-01 DIAGNOSIS — K253 Acute gastric ulcer without hemorrhage or perforation: Secondary | ICD-10-CM

## 2021-04-01 DIAGNOSIS — K209 Esophagitis, unspecified without bleeding: Secondary | ICD-10-CM

## 2021-04-01 DIAGNOSIS — R45851 Suicidal ideations: Secondary | ICD-10-CM

## 2021-04-01 DIAGNOSIS — R531 Weakness: Secondary | ICD-10-CM

## 2021-04-01 DIAGNOSIS — F419 Anxiety disorder, unspecified: Secondary | ICD-10-CM

## 2021-04-01 DIAGNOSIS — K264 Chronic or unspecified duodenal ulcer with hemorrhage: Secondary | ICD-10-CM

## 2021-04-01 DIAGNOSIS — J690 Pneumonitis due to inhalation of food and vomit: Secondary | ICD-10-CM

## 2021-04-01 DIAGNOSIS — K269 Duodenal ulcer, unspecified as acute or chronic, without hemorrhage or perforation: Secondary | ICD-10-CM

## 2021-04-01 DIAGNOSIS — K920 Hematemesis: Secondary | ICD-10-CM | POA: Diagnosis present

## 2021-04-01 HISTORY — PX: ESOPHAGOGASTRODUODENOSCOPY (EGD) WITH PROPOFOL: SHX5813

## 2021-04-01 LAB — COMPREHENSIVE METABOLIC PANEL
ALT: 24 U/L (ref 0–44)
AST: 34 U/L (ref 15–41)
Albumin: 2.8 g/dL — ABNORMAL LOW (ref 3.5–5.0)
Alkaline Phosphatase: 92 U/L (ref 38–126)
Anion gap: 7 (ref 5–15)
BUN: <5 mg/dL — ABNORMAL LOW (ref 8–23)
CO2: 25 mmol/L (ref 22–32)
Calcium: 8.2 mg/dL — ABNORMAL LOW (ref 8.9–10.3)
Chloride: 101 mmol/L (ref 98–111)
Creatinine, Ser: 0.58 mg/dL — ABNORMAL LOW (ref 0.61–1.24)
GFR, Estimated: >60 mL/min (ref >60)
Glucose, Bld: 188 mg/dL — ABNORMAL HIGH (ref 70–99)
Potassium: 3.8 mmol/L (ref 3.5–5.1)
Sodium: 133 mmol/L — ABNORMAL LOW (ref 135–145)
Total Bilirubin: 0.9 mg/dL (ref 0.3–1.2)
Total Protein: 5 g/dL — ABNORMAL LOW (ref 6.5–8.1)

## 2021-04-01 LAB — IRON AND TIBC
Iron: 40 ug/dL — ABNORMAL LOW (ref 45–182)
Saturation Ratios: 21 % (ref 17.9–39.5)
TIBC: 192 ug/dL — ABNORMAL LOW (ref 250–450)
UIBC: 152 ug/dL

## 2021-04-01 LAB — VITAMIN B12: Vitamin B-12: 280 pg/mL (ref 180–914)

## 2021-04-01 LAB — FOLATE: Folate: 43 ng/mL (ref >5.9)

## 2021-04-01 LAB — HEMOGLOBIN AND HEMATOCRIT, BLOOD
HCT: 33.4 % — ABNORMAL LOW (ref 39.0–52.0)
Hemoglobin: 11.1 g/dL — ABNORMAL LOW (ref 13.0–17.0)

## 2021-04-01 LAB — FERRITIN: Ferritin: 143 ng/mL (ref 24–336)

## 2021-04-01 SURGERY — ESOPHAGOGASTRODUODENOSCOPY (EGD) WITH PROPOFOL
Anesthesia: General

## 2021-04-01 MED ORDER — PROPOFOL 10 MG/ML IV BOLUS
INTRAVENOUS | Status: DC | PRN
  Administered 2021-04-01: 30 mg via INTRAVENOUS
  Administered 2021-04-01: 70 mg via INTRAVENOUS

## 2021-04-01 MED ORDER — TRAZODONE HCL 100 MG PO TABS
100.0000 mg | ORAL_TABLET | Freq: Every day | ORAL | Status: DC
  Administered 2021-04-01 – 2021-04-04 (×4): 100 mg via ORAL
  Filled 2021-04-01 (×4): qty 1

## 2021-04-01 MED ORDER — CITALOPRAM HYDROBROMIDE 20 MG PO TABS
20.0000 mg | ORAL_TABLET | Freq: Every day | ORAL | Status: DC
  Administered 2021-04-02 – 2021-04-05 (×4): 20 mg via ORAL
  Filled 2021-04-01 (×6): qty 1

## 2021-04-01 MED ORDER — OXYCODONE HCL 5 MG PO TABS
5.0000 mg | ORAL_TABLET | Freq: Four times a day (QID) | ORAL | Status: DC | PRN
  Administered 2021-04-01 – 2021-04-05 (×14): 5 mg via ORAL
  Filled 2021-04-01 (×15): qty 1

## 2021-04-01 MED ORDER — AMOXICILLIN-POT CLAVULANATE 875-125 MG PO TABS
1.0000 | ORAL_TABLET | Freq: Two times a day (BID) | ORAL | Status: DC
Start: 2021-04-01 — End: 2021-04-04
  Administered 2021-04-01 – 2021-04-03 (×5): 1 via ORAL
  Filled 2021-04-01 (×5): qty 1

## 2021-04-01 MED ORDER — SODIUM CHLORIDE 0.9 % IV SOLN: INTRAVENOUS | Status: DC

## 2021-04-01 MED ORDER — PANTOPRAZOLE SODIUM 40 MG PO TBEC
40.0000 mg | DELAYED_RELEASE_TABLET | Freq: Two times a day (BID) | ORAL | Status: DC
  Administered 2021-04-01 – 2021-04-05 (×8): 40 mg via ORAL
  Filled 2021-04-01 (×8): qty 1

## 2021-04-01 MED ORDER — PROPOFOL 500 MG/50ML IV EMUL
INTRAVENOUS | Status: DC | PRN
Start: 2021-04-01 — End: 2021-04-01
  Administered 2021-04-01: 120 ug/kg/min via INTRAVENOUS

## 2021-04-01 MED ORDER — LIDOCAINE 2% (20 MG/ML) 5 ML SYRINGE
INTRAMUSCULAR | Status: DC | PRN
  Administered 2021-04-01: 20 mg via INTRAVENOUS

## 2021-04-01 NOTE — Progress Notes (Signed)
PT Cancellation Note  Patient Details Name: Derrico Zhong MRN: [id number] DOB: 1951/02/25   Cancelled Treatment:    Reason Eval/Treat Not Completed: Patient at procedure or test/unavailable (Consult received and chart reviewed.  Patient currently off unit for diagnostic procedure.  Will continue to follow and re-attempt at later date/time.)  Quantia Grullon H. Manson Passey, PT, DPT, NCS 04/01/21, 12:52 PM [phone]

## 2021-04-01 NOTE — Progress Notes (Signed)
Patient ID: Chad [name], male   DOB: 1950/08/13, 70 y.o.   MRN: [id number]  Patient accidentally pulled out IV and does not want another one.  All IV meds switch to oral or discontinued  Dr Alford Highland

## 2021-04-01 NOTE — OR Nursing (Signed)
Patient collected from ED-19 for Endoscopy Procedure.  Patient was wearing glasses and it was noted that glasses were broken.  When questioned regarding the same, patient states he fell sometime ago and broke glasses.

## 2021-04-01 NOTE — Transfer of Care (Signed)
Immediate Anesthesia Transfer of Care Note  Patient: Chad [name]  Procedure(s) Performed: ESOPHAGOGASTRODUODENOSCOPY (EGD) WITH PROPOFOL  Patient Location: Endoscopy Unit  Anesthesia Type:General  Level of Consciousness: drowsy  Airway & Oxygen Therapy: Patient Spontanous Breathing  Post-op Assessment: Report given to RN and Post -op Vital signs reviewed and stable  Post vital signs: Reviewed  Last Vitals:  Vitals Value Taken Time  BP    Temp    Pulse    Resp    SpO2      Last Pain:         Complications: No notable events documented.

## 2021-04-01 NOTE — Progress Notes (Signed)
Patient ID: Kadeen Sroka, male   DOB: 1950-04-29, 70 y.o.   MRN: [id number] [facility] PROGRESS NOTE  Brando Taves OZD:[id number] DOB: 10-07-1950 DOA: 03/31/2021 PCP: Rayetta Humphrey, MD  HPI/Subjective: Patient had his last vomiting episode last night.  States his bowel movements have been brown.  Having some severe abdominal pain.  States he gets his best care with me.  He does not follow-up with medical doctor as outpatient.  He went back to drinking.  He also stated he does not want to live.  Objective: Vitals:   04/01/21 1015 04/01/21 1150  BP: (!) 132/105 106/61  Pulse: 69 73  Resp:  14  Temp:  (!) 96.1 F (35.6 C)  SpO2:  93%    Intake/Output Summary (Last 24 hours) at 04/01/2021 1225 Last data filed at 04/01/2021 1143 Gross per 24 hour  Intake 500 ml  Output 1750 ml  Net -1250 ml   Filed Weights   03/31/21 0421  Weight: 72.6 kg    ROS: Review of Systems  Respiratory:  Negative for shortness of breath.   Cardiovascular:  Negative for chest pain.  Gastrointestinal:  Positive for abdominal pain, nausea and vomiting.  Psychiatric/Behavioral:  Positive for depression.   Exam: Physical Exam HENT:     Head: Normocephalic.     Mouth/Throat:     Pharynx: No oropharyngeal exudate.  Eyes:     General: Lids are normal.     Conjunctiva/sclera: Conjunctivae normal.  Cardiovascular:     Rate and Rhythm: Normal rate and regular rhythm.     Heart sounds: Normal heart sounds, S1 normal and S2 normal.  Pulmonary:     Breath sounds: No decreased breath sounds, wheezing, rhonchi or rales.  Abdominal:     Palpations: Abdomen is soft.     Tenderness: There is no abdominal tenderness.  Musculoskeletal:     Right lower leg: No swelling.     Left lower leg: No swelling.  Skin:    General: Skin is warm.     Findings: No rash.  Neurological:     Mental Status: He is alert and oriented to person, place, and time.  Psychiatric:     Comments: Stated to me that he  does not want to live.      Scheduled Meds:  [MAR Hold] feeding supplement  237 mL Oral BID BM   [MAR Hold] folic acid  1 mg Oral Daily   [MAR Hold] LORazepam  0-4 mg Intravenous Q6H   Or   [MAR Hold] LORazepam  0-4 mg Oral Q6H   [MAR Hold] LORazepam  0-4 mg Intravenous Q12H   Or   [MAR Hold] LORazepam  0-4 mg Oral Q12H   [MAR Hold] nicotine  14 mg Transdermal Daily   [MAR Hold] pantoprazole  40 mg Intravenous Q12H   [MAR Hold] thiamine  100 mg Oral Daily   Or   [MAR Hold] thiamine  100 mg Intravenous Daily   Continuous Infusions:  sodium chloride 20 mL/hr at 04/01/21 1130   [MAR Hold] ampicillin-sulbactam (UNASYN) IV Stopped (04/01/21 0924)   dextrose 5 % and 0.9% NaCl Stopped (03/31/21 1833)   pantoprazole 8 mg/hr (04/01/21 0423)    Assessment/Plan:  Upper GI bleed with hematemesis.  Endoscopy showing esophagitis, salmon-colored mucosa in esophagus, gastric ulcer, duodenal ulcer and erythema in duodenum and stomach.  Patient on Protonix drip.  Hemoglobin stable in the 11's. Alcohol abuse with alcoholic ketoacidosis.  Given thiamine and D5 and NS IV fluids. Tree-in-bud pneumonia  right upper lobe.  Being treated for aspiration pneumonia with Unasyn Nonsustained ventricular tachycardia yesterday.  Given IV magnesium.  Continue to monitor on telemetry.  Previous echocardiogram normal. Hyponatremia likely secondary to alcohol abuse.  Sodium up to 133 today. Anxiety depression with suicidal ideation.  Psychiatry consultation.  Looks like he takes Celexa at home will restart. Previous history of low cortisol level.  We will check an a.m. cortisol tomorrow morning. Weakness.  Physical therapy evaluation.     Code Status:     Code Status Orders  (From admission, onward)           Start     Ordered   03/31/21 1123  Full code  Continuous        03/31/21 1122           Code Status History     Date Active Date Inactive Code Status Order ID Comments User Context    03/06/2021 0652 03/06/2021 2154 Full Code [id number]  Nita Sickle, MD ED   01/31/2021 0739 02/27/2021 1806 Full Code [id number]  Audery Amel, MD Inpatient   01/26/2021 0321 01/30/2021 2321 Full Code [id number]  Mansy, Vernetta Honey, MD ED   12/22/2020 1602 01/05/2021 0349 Full Code [id number]  Venora Maples, MD ED   12/21/2020 2139 12/22/2020 1602 Full Code [id number]  Minna Antis, MD ED   09/27/2020 1026 10/02/2020 2148 Full Code [id number]  Lucile Shutters, MD ED   08/01/2020 1655 08/14/2020 1847 Full Code [id number]  Eugenie Norrie, NP ED   05/02/2020 0058 05/03/2020 2039 Full Code [id number]  Briscoe Deutscher, MD ED   02/04/2020 1814 02/10/2020 2052 Full Code [id number]  Lurene Shadow, MD ED   01/14/2020 0247 01/19/2020 2357 Full Code [id number]  Mansy, Vernetta Honey, MD ED   01/14/2020 0144 01/14/2020 0247 Full Code [id number]  Nita Sickle, MD ED   12/31/2019 1631 01/06/2020 2346 Full Code [id number]  Gertha Calkin, MD ED   12/09/2019 1729 12/12/2019 1712 Full Code [id number]  Alford Highland, MD ED   02/20/2019 0944 02/20/2019 1734 Full Code [id number]  Chesley Noon, MD ED   01/23/2019 1648 01/25/2019 1957 Full Code [id number]  Mayo, Allyn Kenner, MD Inpatient   05/17/2018 2327 05/18/2018 1902 Full Code [id number]  Oralia Manis, MD Inpatient   05/17/2018 1950 05/17/2018 2327 Full Code [id number]  Nita Sickle, MD ED   12/24/2017 0803 12/25/2017 1645 Full Code [id number]  Arnaldo Natal, MD ED   06/07/2015 1619 06/08/2015 1754 Full Code [id number]  Shirlean Kelly, MD Inpatient       Disposition Plan: Status is: Inpatient  Consultants: Gastroenterology Psychiatry  Procedures: EGD  Antibiotics: Unasyn  Time spent: 28 minutes  Erroll Wilbourne [employer]  [facility]
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
## 2021-04-01 NOTE — Anesthesia Postprocedure Evaluation (Signed)
Anesthesia Post Note  Patient: Chad [name]  Procedure(s) Performed: ESOPHAGOGASTRODUODENOSCOPY (EGD) WITH PROPOFOL  Patient location during evaluation: PACU Anesthesia Type: General Level of consciousness: awake and alert Pain management: pain level controlled Vital Signs Assessment: post-procedure vital signs reviewed and stable Respiratory status: spontaneous breathing, nonlabored ventilation, respiratory function stable and patient connected to nasal cannula oxygen Cardiovascular status: blood pressure returned to baseline and stable Postop Assessment: no apparent nausea or vomiting Anesthetic complications: no   No notable events documented.   Last Vitals:  Vitals:   04/01/21 1015 04/01/21 1150  BP: (!) 132/105 106/61  Pulse: 69 73  Resp:  14  Temp:  (!) 35.6 C  SpO2:  93%    Last Pain:  Vitals:   04/01/21 1150  TempSrc: Temporal  PainSc: Asleep                 Yevette Edwards

## 2021-04-01 NOTE — Progress Notes (Signed)
Late entry Patient had an episode of nonsustained ventricular tachycardia suspicious for torsades. He received magnesium 2 g IV with resolution. Stat BMP was ordered and potassium was greater than 4.  Magnesium was 1.8

## 2021-04-01 NOTE — Op Note (Signed)
[facility] Gastroenterology Patient Name: Chad [name] Procedure Date: 04/01/2021 11:31 AM MRN: [id number] Account #: [id number] Date of Birth: 04/13/1951 Admit Type: Outpatient Age: 70 Room: [facility] ENDO ROOM 1 Gender: Male Note Status: Finalized Instrument Name: Michaelle Birks [id number] Procedure:             Upper GI endoscopy Indications:           Epigastric abdominal pain, Hematemesis, Melena Providers:             Lin Landsman MD, MD Referring MD:          Rubbie Battiest. Iona Beard MD, MD (Referring MD) Medicines:             General Anesthesia Complications:         No immediate complications. Estimated blood loss: None. Procedure:             Pre-Anesthesia Assessment:                        - Prior to the procedure, a History and Physical was                         performed, and patient medications and allergies were                         reviewed. The patient is competent. The risks and                         benefits of the procedure and the sedation options and                         risks were discussed with the patient. All questions                         were answered and informed consent was obtained.                         Patient identification and proposed procedure were                         verified by the physician, the nurse, the                         anesthesiologist, the anesthetist and the technician                         in the pre-procedure area in the procedure room in the                         endoscopy suite. Mental Status Examination: alert and                         oriented. Airway Examination: normal oropharyngeal                         airway and neck mobility. Respiratory Examination:                         clear to auscultation. CV Examination: normal.  Prophylactic Antibiotics: The patient does not require                         prophylactic antibiotics. Prior Anticoagulants: The                          patient has taken no previous anticoagulant or                         antiplatelet agents. ASA Grade Assessment: III - A                         patient with severe systemic disease. After reviewing                         the risks and benefits, the patient was deemed in                         satisfactory condition to undergo the procedure. The                         anesthesia plan was to use general anesthesia.                         Immediately prior to administration of medications,                         the patient was re-assessed for adequacy to receive                         sedatives. The heart rate, respiratory rate, oxygen                         saturations, blood pressure, adequacy of pulmonary                         ventilation, and response to care were monitored                         throughout the procedure. The physical status of the                         patient was re-assessed after the procedure.                        After obtaining informed consent, the endoscope was                         passed under direct vision. Throughout the procedure,                         the patient's blood pressure, pulse, and oxygen                         saturations were monitored continuously. The Endoscope                         was introduced through the mouth, and advanced to the  second part of duodenum. The upper GI endoscopy was                         accomplished without difficulty. The patient tolerated                         the procedure well. Findings:      Patchy moderate inflammation characterized by congestion (edema),       erythema and friability was found in the second portion of the duodenum.       Biopsies were taken with a cold forceps for histology.      One non-bleeding superficial duodenal ulcer with a clean ulcer base       (Forrest Class III) was found in the duodenal bulb. The lesion was 5 mm        in largest dimension.      One non-bleeding superficial gastric ulcer with a clean ulcer base       (Forrest Class III) was found in the gastric antrum. The lesion was 3 mm       in largest dimension.      Multiple dispersed small erosions with no bleeding and no stigmata of       recent bleeding were found in the gastric antrum. Biopsies were taken       with a cold forceps for Helicobacter pylori testing.      Diffuse moderately erythematous mucosa without bleeding was found in the       gastric body. Biopsies were taken with a cold forceps for histology.      The cardia and gastric fundus were normal on retroflexion.      One tongue of salmon-colored mucosa was present from 34 to 36 cm.       Erosion was present. The maximum longitudinal extent of these esophageal       mucosal changes was 2 cm in length. Biopsies were taken with a cold       forceps for histology.      LA Grade A (one or more mucosal breaks less than 5 mm, not extending       between tops of 2 mucosal folds) esophagitis with no bleeding was found       in the lower third of the esophagus.      A small hiatal hernia was present. Impression:            - Alcoholic duodenitis. Biopsied.                        - Non-bleeding duodenal ulcer with a clean ulcer base                         (Forrest Class III).                        - Non-bleeding gastric ulcer with a clean ulcer base                         (Forrest Class III).                        - Erosive gastropathy with no bleeding and no stigmata  of recent bleeding. Biopsied.                        - Erythematous mucosa in the gastric body. Biopsied.                        - Salmon-colored mucosa suspicious for short-segment                         Barrett's esophagus. Biopsied.                        - LA Grade A reflux esophagitis with no bleeding.                        - Small hiatal hernia. Recommendation:        - Await pathology  results.                        - Return patient to hospital ward for ongoing care.                        - Resume regular diet today.                        - Continue present medications.                        - Await pathology results.                        - Use Prilosec (omeprazole) 40 mg PO BID indefinitely.                        - Repeat upper endoscopy in 3 months to check healing.                        - Return to GI office in 3 months. Procedure Code(s):     --- Professional ---                        [phone], Esophagogastroduodenoscopy, flexible,                         transoral; with biopsy, single or multiple Diagnosis Code(s):     --- Professional ---                        K29.80, Duodenitis without bleeding                        K26.9, Duodenal ulcer, unspecified as acute or                         chronic, without hemorrhage or perforation                        K25.9, Gastric ulcer, unspecified as acute or chronic,                         without hemorrhage or perforation  K22.8, Other specified diseases of esophagus                        K31.89, Other diseases of stomach and duodenum                        K21.00, Gastro-esophageal reflux disease with                         esophagitis, without bleeding                        K44.9, Diaphragmatic hernia without obstruction or                         gangrene                        R10.13, Epigastric pain                        K92.0, Hematemesis                        K92.1, Melena (includes Hematochezia) CPT copyright 2019 American [employer] Association. All rights reserved. The codes documented in this report are preliminary and upon coder review may  be revised to meet current compliance requirements. Dr. Libby Maw Toney Reil MD, MD 04/01/2021 11:52:25 AM This report has been signed electronically. Number of Addenda: 0 Note Initiated On: 04/01/2021 11:31 AM Estimated Blood  Loss:  Estimated blood loss: none.      [facility]

## 2021-04-01 NOTE — ED Notes (Signed)
Pt given meal tray.

## 2021-04-02 ENCOUNTER — Encounter: Payer: Self-pay | Admitting: Gastroenterology

## 2021-04-02 DIAGNOSIS — E871 Hypo-osmolality and hyponatremia: Secondary | ICD-10-CM

## 2021-04-02 DIAGNOSIS — T17908S Unspecified foreign body in respiratory tract, part unspecified causing other injury, sequela: Secondary | ICD-10-CM

## 2021-04-02 DIAGNOSIS — F332 Major depressive disorder, recurrent severe without psychotic features: Secondary | ICD-10-CM

## 2021-04-02 LAB — H PYLORI, IGM, IGG, IGA AB
H Pylori IgG: 0.18 Index Value (ref 0.00–0.79)
H. Pylogi, Iga Abs: <9 units (ref 0.0–8.9)
H. Pylogi, Igm Abs: <9 units (ref 0.0–8.9)

## 2021-04-02 LAB — CBC
HCT: 35 % — ABNORMAL LOW (ref 39.0–52.0)
Hemoglobin: 11.3 g/dL — ABNORMAL LOW (ref 13.0–17.0)
MCH: 28.9 pg (ref 26.0–34.0)
MCHC: 32.3 g/dL (ref 30.0–36.0)
MCV: 89.5 fL (ref 80.0–100.0)
Platelets: 111 10*3/uL — ABNORMAL LOW (ref 150–400)
RBC: 3.91 MIL/uL — ABNORMAL LOW (ref 4.22–5.81)
RDW: 15.2 % (ref 11.5–15.5)
WBC: 4.2 10*3/uL (ref 4.0–10.5)
nRBC: 0 % (ref 0.0–0.2)

## 2021-04-02 LAB — BASIC METABOLIC PANEL
Anion gap: 3 — ABNORMAL LOW (ref 5–15)
BUN: <5 mg/dL — ABNORMAL LOW (ref 8–23)
CO2: 29 mmol/L (ref 22–32)
Calcium: 8.6 mg/dL — ABNORMAL LOW (ref 8.9–10.3)
Chloride: 102 mmol/L (ref 98–111)
Creatinine, Ser: 0.66 mg/dL (ref 0.61–1.24)
GFR, Estimated: >60 mL/min (ref >60)
Glucose, Bld: 121 mg/dL — ABNORMAL HIGH (ref 70–99)
Potassium: 4 mmol/L (ref 3.5–5.1)
Sodium: 134 mmol/L — ABNORMAL LOW (ref 135–145)

## 2021-04-02 LAB — RESP PANEL BY RT-PCR (FLU A&B, COVID) ARPGX2
Influenza A by PCR: NEGATIVE
Influenza B by PCR: NEGATIVE
SARS Coronavirus 2 by RT PCR: NEGATIVE

## 2021-04-02 LAB — SURGICAL PATHOLOGY

## 2021-04-02 LAB — CORTISOL-AM, BLOOD: Cortisol - AM: 6.3 ug/dL — ABNORMAL LOW (ref 6.7–22.6)

## 2021-04-02 MED ORDER — ENSURE ENLIVE PO LIQD: 237.0000 mL | Freq: Two times a day (BID) | ORAL | 0 refills | Status: DC

## 2021-04-02 MED ORDER — ONDANSETRON HCL 4 MG PO TABS
4.0000 mg | ORAL_TABLET | Freq: Four times a day (QID) | ORAL | 0 refills | Status: DC | PRN
Start: 2021-04-02 — End: 2021-04-26

## 2021-04-02 MED ORDER — PANTOPRAZOLE SODIUM 40 MG PO TBEC: 40.0000 mg | DELAYED_RELEASE_TABLET | Freq: Two times a day (BID) | ORAL | Status: DC

## 2021-04-02 MED ORDER — NICOTINE 14 MG/24HR TD PT24
14.0000 mg | MEDICATED_PATCH | Freq: Every day | TRANSDERMAL | 0 refills | Status: DC
Start: 2021-04-03 — End: 2021-09-10

## 2021-04-02 MED ORDER — AMOXICILLIN-POT CLAVULANATE 875-125 MG PO TABS: 1.0000 | ORAL_TABLET | Freq: Two times a day (BID) | ORAL | 0 refills | Status: AC

## 2021-04-02 NOTE — Discharge Summary (Addendum)
Addendum 12/16, 2 PM.  This addendum reflects evaluation of the patient today.  Medication changes including oxycodone changed to every 4 hours for pain.  Sucralfate tablets were changed to liquid for better symptom control.  Patient is stable to transfer to psych level of care.  [facility] [place] at [facility]   PATIENT NAME: Chad [name]    MR#:  [id number]  DATE OF BIRTH:  1951-03-25  DATE OF ADMISSION:  03/31/2021 ADMITTING PHYSICIAN: Alford Highland, MD  DATE OF DISCHARGE to Psychiatry Floor:  04/02/2021  PRIMARY CARE PHYSICIAN: Rayetta Humphrey, MD    ADMISSION DIAGNOSIS:  Alcoholic ketoacidosis [E87.29] GI bleed [K92.2] Right leg pain [M79.604] Left leg pain [M79.605] Gastrointestinal hemorrhage with hematemesis [K92.0] Chest pain, unspecified type [R07.9] Community acquired pneumonia of right upper lobe of lung [J18.9] Hematemesis [K92.0]  DISCHARGE DIAGNOSIS:  Principal Problem:   Severe recurrent major depression without psychotic features (HCC) Active Problems:   Alcoholic ketoacidosis   Alcohol dependence with uncomplicated withdrawal (HCC)   Anxiety and depression   Alcohol abuse   Aspiration into airway   Aspiration pneumonia of right upper lobe due to gastric secretions (HCC)   Nicotine dependence   Hyponatremia   GI bleed   Duodenal ulcer   Erosive esophagitis   Hematemesis   Esophagitis   Acute gastric ulcer without hemorrhage or perforation   Suicidal ideation   SECONDARY DIAGNOSIS:   Past Medical History:  Diagnosis Date   Anxiety    takes lexapro   Chronic kidney disease    patient states he was told he has 'moderate kidney disease'   Degenerative disc disease, lumbar    Exposure to hepatitis C    GERD (gastroesophageal reflux disease)    takes OTC as needed - omeprazole   Headache    on occasion   History of gallstones    Hypertension    diet controlled   Pneumonia approx 15 years ago    HOSPITAL  COURSE:   1.  Upper GI bleed with hematemesis.  Endoscopy showing esophagitis, salmon-colored mucosa in esophagus, gastric ulcer, duodenal ulcer, gastritis and duodenitis.  Patient on Protonix 40 mg twice a day.  Upon discharge from psychiatry floor can use either Protonix 40 mg twice a day or omeprazole 40 mg twice a day.  No alcohol or NSAIDs (no aspirin, Advil, Motrin, Aleve, Goody powder or BC powder). 2.  Alcohol abuse with alcoholic ketoacidosis.  This has improved.  Continue thiamine multivitamin and folic acid 3.  Tree-in-bud pneumonia right upper lobe.  Treat for aspiration pneumonia with Unasyn and then switched over to Augmentin.  Will give 4 more doses of Augmentin upon disposition (875 mg twice a day) 4.  Nonsustained ventricular tachycardia after hematemesis..  Given IV magnesium 5.  Hyponatremia.  From alcohol abuse.  Last sodium 134 6.  Anxiety depression with suicidal ideation.  The patient was interested in commitment.  Case discussed with psychiatry and we will repeat a COVID test and he will be discharged to the psychiatry floor if COVID test negative.  Continuing Celexa and trazodone at this point. 7.  Previous history of low cortisol level.  Patient has not followed up with any specialist.  A.m. cortisol higher than 5 so no need for treatment at this point, especially since he has been noncompliant. 8.  Physical therapy recommending home health  DISCHARGE CONDITIONS:   Satisfactory  CONSULTS OBTAINED:  Gastroenterology Psychiatry  DRUG ALLERGIES:   Allergies  Allergen Reactions  Morphine And Related Other (See Comments)    Causes "bad disposition"   Ibuprofen Other (See Comments)    Gi upset    DISCHARGE MEDICATIONS:   Allergies as of 04/05/2021       Reactions   Morphine And Related Other (See Comments)   Causes "bad disposition"   Ibuprofen Other (See Comments)   Gi upset        Medication List     STOP taking these medications    acamprosate  333 MG tablet Commonly known as: CAMPRAL   gabapentin 300 MG capsule Commonly known as: NEURONTIN   hydrOXYzine 25 MG tablet Commonly known as: ATARAX       TAKE these medications    acetaminophen 325 MG tablet Commonly known as: TYLENOL Take 2 tablets (650 mg total) by mouth every 6 (six) hours as needed for mild pain (pain score 1-3 or temp > 100.5).   citalopram 20 MG tablet Commonly known as: CELEXA Take 1 tablet (20 mg total) by mouth daily.   feeding supplement Liqd Take 237 mLs by mouth 2 (two) times daily between meals.   folic acid 1 MG tablet Commonly known as: FOLVITE Take 1 tablet (1 mg total) by mouth daily.   multivitamin with minerals Tabs tablet Take 1 tablet by mouth daily.   nicotine 14 mg/24hr patch Commonly known as: NICODERM CQ - dosed in mg/24 hours Place 1 patch (14 mg total) onto the skin daily.   ondansetron 4 MG tablet Commonly known as: ZOFRAN Take 1 tablet (4 mg total) by mouth every 6 (six) hours as needed for nausea.   oxyCODONE 5 MG immediate release tablet Commonly known as: Oxy IR/ROXICODONE Take 1 tablet (5 mg total) by mouth every 4 (four) hours as needed for severe pain or moderate pain.   pantoprazole 40 MG tablet Commonly known as: PROTONIX Take 1 tablet (40 mg total) by mouth 2 (two) times daily. What changed: when to take this   sucralfate 1 GM/10ML suspension Commonly known as: CARAFATE Take 10 mLs (1 g total) by mouth 4 (four) times daily -  with meals and at bedtime.   thiamine 100 MG tablet Take 1 tablet (100 mg total) by mouth daily.   traZODone 100 MG tablet Commonly known as: DESYREL Take 1 tablet (100 mg total) by mouth at bedtime.       ASK your doctor about these medications    amoxicillin-clavulanate 875-125 MG tablet Commonly known as: AUGMENTIN Take 1 tablet by mouth every 12 (twelve) hours for 4 doses. Ask about: Should I take this medication?         DISCHARGE INSTRUCTIONS:   Follow-up  psychiatry team 1 day  If you experience worsening of your admission symptoms, develop shortness of breath, life threatening emergency, suicidal or homicidal thoughts you must seek medical attention immediately by calling 911 or calling your MD immediately  if symptoms less severe.  You Must read complete instructions/literature along with all the possible adverse reactions/side effects for all the Medicines you take and that have been prescribed to you. Take any new Medicines after you have completely understood and accept all the possible adverse reactions/side effects.   Please note  You were cared for by a hospitalist during your hospital stay. If you have any questions about your discharge medications or the care you received while you were in the hospital after you are discharged, you can call the unit and asked to speak with the hospitalist on call if  the hospitalist that took care of you is not available. Once you are discharged, your primary care physician will handle any further medical issues. Please note that NO REFILLS for any discharge medications will be authorized once you are discharged, as it is imperative that you return to your primary care physician (or establish a relationship with a primary care physician if you do not have one) for your aftercare needs so that they can reassess your need for medications and monitor your lab values.    Today   CHIEF COMPLAINT:   Chief Complaint  Patient presents with   Hematemesis    HISTORY OF PRESENT ILLNESS:  Chad [name]  is a 70 y.o. male came in with hematemasis   VITAL SIGNS:  Blood pressure 111/70, pulse 66, temperature 98 F (36.7 C), temperature source Oral, resp. rate 16, height 5\' 9"  (1.753 m), weight 72.6 kg, SpO2 94 %.  I/O:   Intake/Output Summary (Last 24 hours) at 04/05/2021 1358 Last data filed at 04/05/2021 1008 Gross per 24 hour  Intake 0 ml  Output 1500 ml  Net -1500 ml    PHYSICAL EXAMINATION:   GENERAL:  70 y.o.-year-old patient lying in the bed with no acute distress.  EYES: Pupils equal, round, reactive to light and accommodation. No scleral icterus. Extraocular muscles intact.  HEENT: Head atraumatic, normocephalic. Oropharynx and nasopharynx clear.   LUNGS: Normal breath sounds bilaterally, no wheezing, rales,rhonchi or crepitation. No use of accessory muscles of respiration.  CARDIOVASCULAR: S1, S2 normal. No murmurs, rubs, or gallops.  ABDOMEN: Soft, slight epigastric tenderness, non-distended.  EXTREMITIES: No pedal edema.  NEUROLOGIC: Cranial nerves II through XII are intact. Muscle strength 5/5 in all extremities. Sensation intact. Gait not checked.  PSYCHIATRIC: The patient is alert and oriented x 3.  SKIN: No obvious rash, lesion, or ulcer.   DATA REVIEW:   CBC Recent Labs  Lab 04/02/21 0131  WBC 4.2  HGB 11.3*  HCT 35.0*  PLT 111*    Chemistries  Recent Labs  Lab 03/31/21 1807 04/01/21 0519 04/02/21 0131  NA 131* 133* 134*  K 4.3 3.8 4.0  CL 98 101 102  CO2 26 25 29   GLUCOSE 118* 188* 121*  BUN 8 <5* <5*  CREATININE 0.65 0.58* 0.66  CALCIUM 8.2* 8.2* 8.6*  MG 1.8  --   --   AST  --  34  --   ALT  --  24  --   ALKPHOS  --  92  --   BILITOT  --  0.9  --       Microbiology Results  Results for orders placed or performed during the hospital encounter of 03/31/21  Resp Panel by RT-PCR (Flu A&B, Covid) Nasopharyngeal Swab     Status: None   Collection Time: 03/31/21  5:37 AM   Specimen: Nasopharyngeal Swab; Nasopharyngeal(NP) swabs in vial transport medium  Result Value Ref Range Status   SARS Coronavirus 2 by RT PCR NEGATIVE NEGATIVE Final    Comment: (NOTE) SARS-CoV-2 target nucleic acids are NOT DETECTED.  The SARS-CoV-2 RNA is generally detectable in upper respiratory specimens during the acute phase of infection. The lowest concentration of SARS-CoV-2 viral copies this assay can detect is 138 copies/mL. A negative result does not  preclude SARS-Cov-2 infection and should not be used as the sole basis for treatment or other patient management decisions. A negative result may occur with  improper specimen collection/handling, submission of specimen other than nasopharyngeal swab, presence of viral mutation(s)  within the areas targeted by this assay, and inadequate number of viral copies(<138 copies/mL). A negative result must be combined with clinical observations, patient history, and epidemiological information. The expected result is Negative.  Fact Sheet for Patients:  BloggerCourse.com  Fact Sheet for Healthcare Providers:  SeriousBroker.it  This test is no t yet approved or cleared by the [place] FDA and  has been authorized for detection and/or diagnosis of SARS-CoV-2 by FDA under an Emergency Use Authorization (EUA). This EUA will remain  in effect (meaning this test can be used) for the duration of the COVID-19 declaration under Section 564(b)(1) of the Act, 21 U.S.C.section 360bbb-3(b)(1), unless the authorization is terminated  or revoked sooner.       Influenza A by PCR NEGATIVE NEGATIVE Final   Influenza B by PCR NEGATIVE NEGATIVE Final    Comment: (NOTE) The Xpert Xpress SARS-CoV-2/FLU/RSV plus assay is intended as an aid in the diagnosis of influenza from Nasopharyngeal swab specimens and should not be used as a sole basis for treatment. Nasal washings and aspirates are unacceptable for Xpert Xpress SARS-CoV-2/FLU/RSV testing.  Fact Sheet for Patients: BloggerCourse.com  Fact Sheet for Healthcare Providers: SeriousBroker.it  This test is not yet approved or cleared by the [place] FDA and has been authorized for detection and/or diagnosis of SARS-CoV-2 by FDA under an Emergency Use Authorization (EUA). This EUA will remain in effect (meaning this test can be used) for the  duration of the COVID-19 declaration under Section 564(b)(1) of the Act, 21 U.S.C. section 360bbb-3(b)(1), unless the authorization is terminated or revoked.  Performed at [facility], [address] [place], [place] [address]   Resp Panel by RT-PCR (Flu A&B, Covid) Nasopharyngeal Swab     Status: None   Collection Time: 04/02/21  3:21 PM   Specimen: Nasopharyngeal Swab; Nasopharyngeal(NP) swabs in vial transport medium  Result Value Ref Range Status   SARS Coronavirus 2 by RT PCR NEGATIVE NEGATIVE Final    Comment: (NOTE) SARS-CoV-2 target nucleic acids are NOT DETECTED.  The SARS-CoV-2 RNA is generally detectable in upper respiratory specimens during the acute phase of infection. The lowest concentration of SARS-CoV-2 viral copies this assay can detect is 138 copies/mL. A negative result does not preclude SARS-Cov-2 infection and should not be used as the sole basis for treatment or other patient management decisions. A negative result may occur with  improper specimen collection/handling, submission of specimen other than nasopharyngeal swab, presence of viral mutation(s) within the areas targeted by this assay, and inadequate number of viral copies(<138 copies/mL). A negative result must be combined with clinical observations, patient history, and epidemiological information. The expected result is Negative.  Fact Sheet for Patients:  BloggerCourse.com  Fact Sheet for Healthcare Providers:  SeriousBroker.it  This test is no t yet approved or cleared by the [place] FDA and  has been authorized for detection and/or diagnosis of SARS-CoV-2 by FDA under an Emergency Use Authorization (EUA). This EUA will remain  in effect (meaning this test can be used) for the duration of the COVID-19 declaration under Section 564(b)(1) of the Act, 21 U.S.C.section 360bbb-3(b)(1), unless the authorization is terminated  or  revoked sooner.       Influenza A by PCR NEGATIVE NEGATIVE Final   Influenza B by PCR NEGATIVE NEGATIVE Final    Comment: (NOTE) The Xpert Xpress SARS-CoV-2/FLU/RSV plus assay is intended as an aid in the diagnosis of influenza from Nasopharyngeal swab specimens and should not be used as a  sole basis for treatment. Nasal washings and aspirates are unacceptable for Xpert Xpress SARS-CoV-2/FLU/RSV testing.  Fact Sheet for Patients: BloggerCourse.com  Fact Sheet for Healthcare Providers: SeriousBroker.it  This test is not yet approved or cleared by the [place] FDA and has been authorized for detection and/or diagnosis of SARS-CoV-2 by FDA under an Emergency Use Authorization (EUA). This EUA will remain in effect (meaning this test can be used) for the duration of the COVID-19 declaration under Section 564(b)(1) of the Act, 21 U.S.C. section 360bbb-3(b)(1), unless the authorization is terminated or revoked.  Performed at [facility], [address], [place], [place] [address]       Management plans discussed with the patient, and he is in agreement.  CODE STATUS:     Code Status Orders  (From admission, onward)           Start     Ordered   03/31/21 1123  Full code  Continuous        03/31/21 1122           Code Status History     Date Active Date Inactive Code Status Order ID Comments User Context   03/06/2021 0652 03/06/2021 2154 Full Code [id number]  Nita Sickle, MD ED   01/31/2021 0739 02/27/2021 1806 Full Code [id number]  Audery Amel, MD Inpatient   01/26/2021 0321 01/30/2021 2321 Full Code [id number]  Mansy, Vernetta Honey, MD ED   12/22/2020 1602 01/05/2021 0349 Full Code [id number]  Venora Maples, MD ED   12/21/2020 2139 12/22/2020 1602 Full Code [id number]  Minna Antis, MD ED   09/27/2020 1026 10/02/2020 2148 Full Code [id number]  Lucile Shutters, MD ED   08/01/2020 1655 08/14/2020 1847  Full Code [id number]  Eugenie Norrie, NP ED   05/02/2020 0058 05/03/2020 2039 Full Code [id number]  Briscoe Deutscher, MD ED   02/04/2020 1814 02/10/2020 2052 Full Code [id number]  Lurene Shadow, MD ED   01/14/2020 0247 01/19/2020 2357 Full Code [id number]  Mansy, Vernetta Honey, MD ED   01/14/2020 0144 01/14/2020 0247 Full Code [id number]  Nita Sickle, MD ED   12/31/2019 1631 01/06/2020 2346 Full Code [id number]  Gertha Calkin, MD ED   12/09/2019 1729 12/12/2019 1712 Full Code [id number]  Alford Highland, MD ED   02/20/2019 0944 02/20/2019 1734 Full Code [id number]  Chesley Noon, MD ED   01/23/2019 1648 01/25/2019 1957 Full Code [id number]  Mayo, Allyn Kenner, MD Inpatient   05/17/2018 2327 05/18/2018 1902 Full Code [id number]  Oralia Manis, MD Inpatient   05/17/2018 1950 05/17/2018 2327 Full Code [id number]  Nita Sickle, MD ED   12/24/2017 0803 12/25/2017 1645 Full Code [id number]  Arnaldo Natal, MD ED   06/07/2015 1619 06/08/2015 1754 Full Code [id number]  Shirlean Kelly, MD Inpatient       TOTAL TIME TAKING CARE OF THIS PATIENT: 32 minutes.    Dorcas Carrow M.D on 04/05/2021 at 1:58 PM   [facility]  CC: Primary care physician; Rayetta Humphrey, MD

## 2021-04-02 NOTE — Evaluation (Signed)
Physical Therapy Evaluation Patient Details Name: Chad [name] MRN: [id number] DOB: [birth year] Today's Date: 04/02/2021  History of Present Illness  Chad [name] is a 70yoM who [facility]21 after hematemesis and diffuse abdominal pain. PMH: ETOH abuse, MDD, BPH, HTN, DVT, acute encephalopathy. Pt seen by GI for EGD 12/12 noted Alcoholic duodenitis, duodenal ulcer, gastric ulcer. Pt also being treated for PNA, alcoholic ketoacidosis, v-tach episodes, SI.  Clinical Impression  Pt admitted with above diagnosis. Pt currently with functional limitations due to the deficits listed below (see "PT Problem List"). Patient agreeable to PT evaluation. Patient confirms PLOF and home environment details, none changed since prior admission. Pt reports feeling generally weak and painful, but needs no physical assist to come to EOB, stand up, or AMB 16ft. He is not agreeable to attempt AMB beyond 59ft. Despite falls anxiety, he continues to voice desire to mobilize in room ad lib despite being told this is not permitted- this is similar to prior admissions. Pt asks that RW remain unfolded for easy access, asks that bed alarm be left off- this request is not honored. Patient's assessment this date reveals the patient requires an additional person present for safety and/or physical assistance to complete their typical ADL. At baseline, the patient is able to perform ADL with modified independence. Patient will benefit from skilled PT intervention to maximize independence and safety in mobility required for basic ADL performance at discharge.        Recommendations for follow up therapy are one component of a multi-disciplinary discharge planning process, led by the attending physician.  Recommendations may be updated based on patient status, additional functional criteria and insurance authorization.  Follow Up Recommendations Home health PT    Assistance Recommended at Discharge Set up  Supervision/Assistance  Functional Status Assessment Patient has had a recent decline in their functional status and demonstrates the ability to make significant improvements in function in a reasonable and predictable amount of time.  Equipment Recommendations  None recommended by PT    Recommendations for Other Services       Precautions / Restrictions Precautions Precautions: Fall Restrictions Weight Bearing Restrictions: No      Mobility  Bed Mobility Overal bed mobility: Modified Independent                  Transfers Overall transfer level: Needs assistance Equipment used: Rolling walker (2 wheels) Transfers: Sit to/from Stand Sit to Stand: Min guard           General transfer comment: increased effort, failed first attempt improved with BUE off bed. No LOB in standing    Ambulation/Gait Ambulation/Gait assistance: Min guard Gait Distance (Feet): 60 Feet Assistive device: Rolling walker (2 wheels) Gait Pattern/deviations: WFL(Within Functional Limits);Step-to pattern       General Gait Details: agreeable to AMB over to window and back twice, then no more, cites falls  Stairs            Wheelchair Mobility    Modified Rankin (Stroke Patients Only)       Balance Overall balance assessment: History of Falls;No apparent balance deficits (not formally assessed)                                           Pertinent Vitals/Pain Pain Assessment:  (RUQ pain, does not rate)    Home Living Family/patient expects to be discharged to::  Private residence Living Arrangements: Alone Available Help at Discharge: Neighbor Type of Home: Apartment Home Access: Stairs to enter Entrance Stairs-Rails: Right;Left;Can reach both Entrance Stairs-Number of Steps: 13   Home Layout: One level Home Equipment: Agricultural consultant (2 wheels);Cane - single point      Prior Function Prior Level of Function : History of Falls (last six months)              Mobility Comments: household AMB, frequent falls; doesn't go out much; neighbors bring him Transport planner Dominance        Extremity/Trunk Assessment                Communication      Cognition Arousal/Alertness: Awake/alert Behavior During Therapy: Restless;Anxious (easily overstimululated) Overall Cognitive Status: History of cognitive impairments - at baseline                                          General Comments      Exercises     Assessment/Plan    PT Assessment Patient needs continued PT services  PT Problem List Decreased strength;Decreased range of motion;Decreased activity tolerance;Decreased balance;Decreased mobility;Decreased cognition;Decreased safety awareness;Decreased knowledge of precautions;Cardiopulmonary status limiting activity       PT Treatment Interventions DME instruction;Balance training;Gait training;Stair training;Functional mobility training;Therapeutic activities;Therapeutic exercise;Patient/family education;Neuromuscular re-education    PT Goals (Current goals can be found in the Care Plan section)  Acute Rehab PT Goals PT Goal Formulation: Patient unable to participate in goal setting    Frequency Min 2X/week   Barriers to discharge Decreased caregiver support      Co-evaluation               AM-PAC PT "6 Clicks" Mobility  Outcome Measure Help needed turning from your back to your side while in a flat bed without using bedrails?: A Little Help needed moving from lying on your back to sitting on the side of a flat bed without using bedrails?: A Little Help needed moving to and from a bed to a chair (including a wheelchair)?: A Little Help needed standing up from a chair using your arms (e.g., wheelchair or bedside chair)?: A Little Help needed to walk in hospital room?: A Little Help needed climbing 3-5 steps with a railing? : A Little 6 Click Score: 18    End of Session  Equipment Utilized During Treatment: Gait belt Activity Tolerance: Patient tolerated treatment well Patient left: in bed;with call bell/phone within reach;with bed alarm set   PT Visit Diagnosis: Difficulty in walking, not elsewhere classified (R26.2);Muscle weakness (generalized) (M62.81);Other abnormalities of gait and mobility (R26.89);History of falling (Z91.81)    Time: 9678-9381 PT Time Calculation (min) (ACUTE ONLY): 16 min   Charges:   PT Evaluation $PT Eval Moderate Complexity: 1 Mod         12:58 PM, 04/02/21 Rosamaria Lints, PT, DPT Physical Therapist - [facility]  [phone] (ASCOM)    Jakaden Ouzts C 04/02/2021, 12:53 PM
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
## 2021-04-02 NOTE — Progress Notes (Signed)
Patient to be discharged to Psychiatry, awaiting bed.  COVID test completed and resulted.  Patient does not have PIV and his glasses are at bedside.

## 2021-04-02 NOTE — TOC Progression Note (Addendum)
Transition of Care [facility]) - Progression Note    Patient Details  Name: Chad [name] MRN: [id number] Date of Birth: [birth year]  Transition of [facility]) CM/SW Contact  Maree Krabbe, LCSW Phone Number: 04/02/2021, 3:03 PM  Clinical Narrative:   Pt to transfer down to psych unit. Pending covid results.         Expected Discharge Plan and Services                                                 Social Determinants of Health (SDOH) Interventions    Readmission Risk Interventions Readmission Risk Prevention Plan 01/26/2021  Transportation Screening Complete  PCP or Specialist Appt within 3-5 Days Complete  HRI or Home Care Consult Complete  Social Work Consult for Recovery Care Planning/Counseling Complete  Palliative Care Screening Not Applicable  Medication Review [facility]) Complete  Some recent data might be hidden

## 2021-04-03 MED ORDER — SUCRALFATE 1 G PO TABS
1.0000 g | ORAL_TABLET | Freq: Three times a day (TID) | ORAL | Status: DC
  Administered 2021-04-03 – 2021-04-04 (×3): 1 g via ORAL
  Filled 2021-04-03 (×3): qty 1

## 2021-04-03 MED ORDER — SUCRALFATE 1 G PO TABS: 1.0000 g | ORAL_TABLET | Freq: Three times a day (TID) | ORAL | Status: DC

## 2021-04-03 NOTE — Care Management Important Message (Signed)
Important Message  Patient Details  Name: Chad [name] MRN: [id number] Date of Birth: 03/07/1951   Medicare Important Message Given:  Other (see comment)  Per notes, transfer to BMU pending bed availability.  Medicare IM withheld at this time.   [name] 04/03/2021, 10:55 AM

## 2021-04-03 NOTE — TOC Progression Note (Signed)
Transition of Care [facility]) - Progression Note    Patient Details  Name: Chad [name] MRN: [id number] Date of Birth: [birth year]  Transition of Care [facility]) CM/SW Contact  Chapman Fitch, RN Phone Number: 04/03/2021, 9:20 AM  Clinical Narrative:     Pending transfer to psych unit       Expected Discharge Plan and Services           Expected Discharge Date: 04/02/21                                     Social Determinants of Health (SDOH) Interventions    Readmission Risk Interventions Readmission Risk Prevention Plan 01/26/2021  Transportation Screening Complete  PCP or Specialist Appt within 3-5 Days Complete  HRI or Home Care Consult Complete  Social Work Consult for Recovery Care Planning/Counseling Complete  Palliative Care Screening Not Applicable  Medication Review Oceanographer) Complete  Some recent data might be hidden

## 2021-04-03 NOTE — Progress Notes (Signed)
Patient seen and examined.  70 year old gentleman admitted to the hospital with upper GI bleeding, found to have alcohol induced gastritis, esophagitis and duodenitis.  On PPI twice daily.  Continues to have some discomfort epigastrium postprandial, will add sucralfate 1 g 4 times a day.  Patient has significant history of alcohol abuse, seen by behavioral health and planning to transfer to behavioral health for rehab.  Assessment plan: Reviewed.  Continue current level of care.  Will add sucralfate.  Stable to transfer to behavioral health when bed is available.   Total time spent: 20 minutes.

## 2021-04-04 MED ORDER — LOPERAMIDE HCL 2 MG PO CAPS
2.0000 mg | ORAL_CAPSULE | Freq: Four times a day (QID) | ORAL | Status: DC | PRN
  Administered 2021-04-04: 2 mg via ORAL
  Filled 2021-04-04: qty 1

## 2021-04-04 MED ORDER — SUCRALFATE 1 GM/10ML PO SUSP
1.0000 g | Freq: Three times a day (TID) | ORAL | Status: DC
  Administered 2021-04-04 – 2021-04-05 (×5): 1 g via ORAL
  Filled 2021-04-04 (×5): qty 10

## 2021-04-04 MED ORDER — SUCRALFATE 1 GM/10ML PO SUSP: 1.0000 g | Freq: Three times a day (TID) | ORAL | 0 refills | Status: DC

## 2021-04-04 NOTE — Progress Notes (Signed)
Patient seen and examined.  Has some discomfort in the abdomen remains.  He will benefit with sucralfate solution then tablet.  Will change.  He had 4 loose watery bowel movement last 24 hours and was bothersome at night.  No nausea or vomiting.  Plan: Diarrhea, can use Imodium.  No clinical evidence of C. difficile.  Discontinue Augmentin, completed 5 days of therapy. Changed sucralfate to liquid.  Continue PPI.  Continue mobility. Medically stable to transfer to inpatient psych when bed available. Will need to update discharge recommendations at the time of discharge.  Total time spent: 22 minutes.

## 2021-04-04 NOTE — Progress Notes (Signed)
PT Cancellation Note  Patient Details Name: Chad [name] MRN: [id number] DOB: 1950/09/25   Cancelled Treatment:    Reason Eval/Treat Not Completed: Patient declined, no reason specified.  Therapist chart reviewed and attempted to see pt.  Pt currently eating dinner and requested to not be sen.  Pt notes he is currently in room temporarily until they are able to secure a bed for him on geripsych unit.  Will re-attempt as medically appropriate.   Nolon Bussing, PT, DPT 04/04/21, 5:37 PM

## 2021-04-05 ENCOUNTER — Other Ambulatory Visit: Payer: Self-pay

## 2021-04-05 ENCOUNTER — Inpatient Hospital Stay
Admission: AD | Admit: 2021-04-05 | Discharge: 2021-04-26 | DRG: 885 | Disposition: A | Discharge status: Home / Self Care | Payer: Medicare HMO | Source: Intra-hospital | Attending: Psychiatry | Admitting: Psychiatry

## 2021-04-05 ENCOUNTER — Encounter: Payer: Self-pay | Admitting: Psychiatry

## 2021-04-05 DIAGNOSIS — Z811 Family history of alcohol abuse and dependence: Secondary | ICD-10-CM

## 2021-04-05 DIAGNOSIS — Z79899 Other long term (current) drug therapy: Secondary | ICD-10-CM

## 2021-04-05 DIAGNOSIS — K760 Fatty (change of) liver, not elsewhere classified: Secondary | ICD-10-CM | POA: Diagnosis present

## 2021-04-05 DIAGNOSIS — R45851 Suicidal ideations: Secondary | ICD-10-CM | POA: Diagnosis present

## 2021-04-05 DIAGNOSIS — E512 Wernicke's encephalopathy: Secondary | ICD-10-CM | POA: Diagnosis present

## 2021-04-05 DIAGNOSIS — F419 Anxiety disorder, unspecified: Secondary | ICD-10-CM | POA: Diagnosis present

## 2021-04-05 DIAGNOSIS — F332 Major depressive disorder, recurrent severe without psychotic features: Secondary | ICD-10-CM | POA: Diagnosis present

## 2021-04-05 DIAGNOSIS — K5730 Diverticulosis of large intestine without perforation or abscess without bleeding: Secondary | ICD-10-CM | POA: Diagnosis present

## 2021-04-05 DIAGNOSIS — N189 Chronic kidney disease, unspecified: Secondary | ICD-10-CM | POA: Diagnosis present

## 2021-04-05 DIAGNOSIS — F1020 Alcohol dependence, uncomplicated: Secondary | ICD-10-CM | POA: Diagnosis present

## 2021-04-05 DIAGNOSIS — I129 Hypertensive chronic kidney disease with stage 1 through stage 4 chronic kidney disease, or unspecified chronic kidney disease: Secondary | ICD-10-CM | POA: Diagnosis present

## 2021-04-05 DIAGNOSIS — K219 Gastro-esophageal reflux disease without esophagitis: Secondary | ICD-10-CM | POA: Diagnosis present

## 2021-04-05 MED ORDER — PANTOPRAZOLE SODIUM 40 MG PO TBEC
40.0000 mg | DELAYED_RELEASE_TABLET | Freq: Two times a day (BID) | ORAL | Status: DC
  Administered 2021-04-05 – 2021-04-26 (×42): 40 mg via ORAL
  Filled 2021-04-05 (×42): qty 1

## 2021-04-05 MED ORDER — OXYCODONE HCL 5 MG PO TABS
5.0000 mg | ORAL_TABLET | ORAL | Status: DC | PRN
  Administered 2021-04-05: 5 mg via ORAL
  Filled 2021-04-05: qty 1

## 2021-04-05 MED ORDER — ACETAMINOPHEN 325 MG PO TABS
650.0000 mg | ORAL_TABLET | Freq: Four times a day (QID) | ORAL | Status: DC | PRN
  Administered 2021-04-09: 22:00:00 650 mg via ORAL
  Filled 2021-04-05 (×2): qty 2

## 2021-04-05 MED ORDER — ALUM & MAG HYDROXIDE-SIMETH 200-200-20 MG/5ML PO SUSP: 30.0000 mL | ORAL | Status: DC | PRN

## 2021-04-05 MED ORDER — FOLIC ACID 1 MG PO TABS
1.0000 mg | ORAL_TABLET | Freq: Every day | ORAL | Status: DC
  Administered 2021-04-06 – 2021-04-26 (×21): 1 mg via ORAL
  Filled 2021-04-05 (×21): qty 1

## 2021-04-05 MED ORDER — TRAZODONE HCL 50 MG PO TABS
100.0000 mg | ORAL_TABLET | Freq: Every day | ORAL | Status: DC
  Administered 2021-04-05 – 2021-04-07 (×3): 100 mg via ORAL
  Filled 2021-04-05 (×3): qty 2

## 2021-04-05 MED ORDER — OXYCODONE HCL 5 MG PO TABS
5.0000 mg | ORAL_TABLET | ORAL | Status: DC | PRN
  Administered 2021-04-05 – 2021-04-06 (×3): 5 mg via ORAL
  Filled 2021-04-05 (×3): qty 1

## 2021-04-05 MED ORDER — SUCRALFATE 1 GM/10ML PO SUSP
1.0000 g | Freq: Three times a day (TID) | ORAL | Status: DC
  Administered 2021-04-05 – 2021-04-26 (×82): 1 g via ORAL
  Filled 2021-04-05 (×87): qty 10

## 2021-04-05 MED ORDER — LOPERAMIDE HCL 2 MG PO CAPS
2.0000 mg | ORAL_CAPSULE | Freq: Four times a day (QID) | ORAL | Status: DC | PRN
  Filled 2021-04-05: qty 1

## 2021-04-05 MED ORDER — MAGNESIUM HYDROXIDE 400 MG/5ML PO SUSP: 30.0000 mL | Freq: Every day | ORAL | Status: DC | PRN

## 2021-04-05 MED ORDER — OXYCODONE HCL 5 MG PO TABS: 5.0000 mg | ORAL_TABLET | ORAL | 0 refills | Status: DC | PRN

## 2021-04-05 MED ORDER — CITALOPRAM HYDROBROMIDE 20 MG PO TABS
20.0000 mg | ORAL_TABLET | Freq: Every day | ORAL | Status: DC
  Administered 2021-04-06 – 2021-04-16 (×11): 20 mg via ORAL
  Filled 2021-04-05 (×11): qty 1

## 2021-04-05 MED ORDER — ENSURE ENLIVE PO LIQD
237.0000 mL | Freq: Two times a day (BID) | ORAL | Status: DC
  Administered 2021-04-06 – 2021-04-26 (×38): 237 mL via ORAL

## 2021-04-05 NOTE — Progress Notes (Signed)
Report was called to [facility], RN with the geriatric-psych unit. NT transported patient via wheelchair. AVS reviewed with the patient, he verbalized understanding of new medications and follow-up instructions.

## 2021-04-05 NOTE — Progress Notes (Signed)
Patient is a 70 Y.O. male admitted to the unit via wheelchair accompanied by staff. Patient is alert and oriented. Patient observed to be anxious. Patient stated I'm here for treatment for my anxiety and depression". Denies SI, HI, AVH. Verbalize depression and anxiety 8/10. Body assessment done with 2nd RN. Patient observed with small dime size dry patch on left knee, per patient it's psoriasis. Orientation provided to the unit and call light. Patient ate supper in the day room among staff and peers with good appetite. Q15 minutes safety checks maintained. Patient remain safe on the unit at this time.

## 2021-04-05 NOTE — Tx Team (Signed)
Initial Treatment Plan 04/05/2021 6:00 PM Chad [name] [id number]    PATIENT STRESSORS: Other: Anxiety and depression     PATIENT STRENGTHS: Other: Unable to specify at this time   PATIENT IDENTIFIED PROBLEMS:  Depression  Anxiety                   DISCHARGE CRITERIA:  Improved stabilization in mood, thinking, and/or behavior Motivation to continue treatment in a less acute level of care  PRELIMINARY DISCHARGE PLAN: Return to previous living arrangement  PATIENT/FAMILY INVOLVEMENT: This treatment plan has been presented to and reviewed with the patient, Chad [name], .  The patient have been given the opportunity to ask questions and make suggestions.  [name], RN 04/05/2021, 6:00 PM

## 2021-04-05 NOTE — Progress Notes (Signed)
Physical Therapy Treatment Patient Details Name: Chad [name] MRN: [id number] DOB: Jul 10, 1950 Today's Date: 04/05/2021   History of Present Illness Chad [name] is a 70yoM who comes to [facility] 03/31/21 after hematemesis and diffuse abdominal pain. PMH: ETOH abuse, MDD, BPH, HTN, DVT, acute encephalopathy. Pt seen by GI for EGD 12/12 noted Alcoholic duodenitis, duodenal ulcer, gastric ulcer. Pt also being treated for PNA, alcoholic ketoacidosis, v-tach episodes, SI.    PT Comments    Pt alert, oriented to self, place, situation, pleasant throughout. Complaints of R sided pain, RN notified at end of session. The patient was able to perform supine <> sit modI with bed rails. Sit <> stand with RW and supervision, cued for hand placement to improve safety. Pt declined out of room ambulation, but able to go ~63ft in room with RW and CGA/supervision. Pt declined further mobility at this time, cited "my legs feel tired". The patient would benefit from further skilled PT intervention to continue to progress towards goals. Recommendation remains appropriate.       Recommendations for follow up therapy are one component of a multi-disciplinary discharge planning process, led by the attending physician.  Recommendations may be updated based on patient status, additional functional criteria and insurance authorization.  Follow Up Recommendations  Home health PT     Assistance Recommended at Discharge Set up Supervision/Assistance  Equipment Recommendations  None recommended by PT    Recommendations for Other Services       Precautions / Restrictions Precautions Precautions: Fall Restrictions Weight Bearing Restrictions: No     Mobility  Bed Mobility Overal bed mobility: Modified Independent                  Transfers Overall transfer level: Needs assistance Equipment used: Rolling walker (2 wheels) Transfers: Sit to/from Stand Sit to Stand: Supervision           General  transfer comment: cued for hand placement when returning to sitting    Ambulation/Gait Ambulation/Gait assistance: Supervision Gait Distance (Feet): 60 Feet Assistive device: Rolling walker (2 wheels)         General Gait Details: pt deferred out of room mobility, ambulated 3 laps in room   Stairs             Wheelchair Mobility    Modified Rankin (Stroke Patients Only)       Balance Overall balance assessment: History of Falls;No apparent balance deficits (not formally assessed)                                          Cognition Arousal/Alertness: Awake/alert Behavior During Therapy: WFL for tasks assessed/performed Overall Cognitive Status: History of cognitive impairments - at baseline                                          Exercises General Exercises - Lower Extremity Ankle Circles/Pumps: AROM;Both;5 reps Heel Slides: AROM;Strengthening;Both;5 reps Straight Leg Raises: AROM;Strengthening;Both;5 reps    General Comments        Pertinent Vitals/Pain Pain Assessment: 0-10 Pain Score: 6  Pain Location: r flank/abdominal pain Pain Descriptors / Indicators: Aching Pain Intervention(s): Limited activity within patient's tolerance;Monitored during session;Repositioned;Patient requesting pain meds-RN notified    Home Living  Prior Function            PT Goals (current goals can now be found in the care plan section) Progress towards PT goals: Progressing toward goals    Frequency           PT Plan Current plan remains appropriate    Co-evaluation              AM-PAC PT "6 Clicks" Mobility   Outcome Measure  Help needed turning from your back to your side while in a flat bed without using bedrails?: None Help needed moving from lying on your back to sitting on the side of a flat bed without using bedrails?: None Help needed moving to and from a bed to a chair  (including a wheelchair)?: None Help needed standing up from a chair using your arms (e.g., wheelchair or bedside chair)?: None Help needed to walk in hospital room?: A Little Help needed climbing 3-5 steps with a railing? : A Little 6 Click Score: 22    End of Session Equipment Utilized During Treatment: Gait belt Activity Tolerance: Patient tolerated treatment well Patient left: in bed;with call bell/phone within reach;with bed alarm set Nurse Communication: Mobility status PT Visit Diagnosis: Difficulty in walking, not elsewhere classified (R26.2);Muscle weakness (generalized) (M62.81);Other abnormalities of gait and mobility (R26.89);History of falling (Z91.81)     Time: 5638-7564 PT Time Calculation (min) (ACUTE ONLY): 17 min  Charges:  $Therapeutic Exercise: 8-22 mins                     Olga Coaster PT, DPT 11:55 AM,04/05/21

## 2021-04-05 NOTE — Progress Notes (Signed)
Patient is seen in his bedroom lying in bed awake. Pt rates pain in right rib cage an 8/10. Pt rated anxiety and depression a 7/10. Pt denies SI, HI, and AVH at this time. Pt remains safe on the unit at this time.

## 2021-04-05 NOTE — Progress Notes (Signed)
Patient seen and examined.  Waiting for [facility] psych bed.  Diarrhea resolved with 1 dose of Imodium yesterday morning.  Abdomen pain and distention continues but is better with oxycodone.  Will increase dose of oxycodone every 4 hours.  Sucralfate solution was better than the tablets.  Plan: Stable to transfer to psychiatric unit.  Med rec will be redone and discharge summary will be updated.  Total time spent: 20 minutes.

## 2021-04-06 DIAGNOSIS — F332 Major depressive disorder, recurrent severe without psychotic features: Principal | ICD-10-CM

## 2021-04-06 DIAGNOSIS — F1020 Alcohol dependence, uncomplicated: Secondary | ICD-10-CM

## 2021-04-06 MED ORDER — OXYCODONE HCL 5 MG PO TABS
5.0000 mg | ORAL_TABLET | Freq: Four times a day (QID) | ORAL | Status: DC | PRN
  Administered 2021-04-06 – 2021-04-26 (×56): 5 mg via ORAL
  Filled 2021-04-06 (×57): qty 1
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
## 2021-04-06 NOTE — Progress Notes (Signed)
Patient is seen in bedroom sleeping. Pt refused snacks and is isolative to his room. Pt is fixated on oxycodone and rates pain 8/10 on right ribcage. Pt denies SI, HI, and AVH at this time. Pt rates anxiety and depression 7/10. Pt remains safe on the unit at this time.

## 2021-04-06 NOTE — Progress Notes (Signed)
Patient is alert oriented x4. Calm and cooperative. Complain of pain 8/10 medicated with PRN pain medication. Complain of anxiety and depression rating 7/10. Denies SI, HI, AVH. Compliant with medications. Ate breakfast in the day room among staff and peers and tolerated well. Ambulatory with front wheel walker. Q15 minutes checks maintained. Remain safe on the unit at this time.

## 2021-04-06 NOTE — [facility] Suicide Risk Assessment (Signed)
[facility] INPATIENT:  Family/Significant Other Suicide Prevention Education  Suicide Prevention Education:  Patient Refusal for Family/Significant Other Suicide Prevention Education: The patient Chad [name] has refused to provide written consent for family/significant other to be provided Family/Significant Other Suicide Prevention Education during admission and/or prior to discharge.  Physician notified.  SPE completed with pt, as pt refused to consent to family contact. SPI pamphlet provided to pt and pt was encouraged to share information with support network, ask questions, and talk about any concerns relating to SPE. Pt denies access to guns/firearms and verbalized understanding of information provided. Mobile Crisis information also provided to pt.   Ileana Ladd Gwendy Boeder 04/06/2021, 5:32 PM

## 2021-04-06 NOTE — Group Note (Signed)
LCSW Group Therapy Note  Group Date: 04/06/2021 Start Time: 1400 End Time: 1515   Type of Therapy and Topic:  Group Therapy - Healthy vs Unhealthy Coping Skills  Participation Level:  Active   Description of Group The focus of this group was to determine what unhealthy coping techniques typically are used by group members and what healthy coping techniques would be helpful in coping with various problems. Patients were guided in becoming aware of the differences between healthy and unhealthy coping techniques. Patients were asked to identify 2-3 healthy coping skills they would like to learn to use more effectively.  Therapeutic Goals Patients learned that coping is what human beings do all day long to deal with various situations in their lives Patients defined and discussed healthy vs unhealthy coping techniques Patients identified their preferred coping techniques and identified whether these were healthy or unhealthy Patients determined 2-3 healthy coping skills they would like to become more familiar with and use more often. Patients provided support and ideas to each other   Summary of Patient Progress: Patient was present for the duration of group and actively participated in topic of discussion. Patient was not able to identify any coping skills that have worked well for him in the past. Patient was receptive to coping skills discussed during group and inquired about what he can do when experiencing intense feelings of depression and anxiety. CSW reviewed several emotional regulation coping skills with patient and provided patient with crisis resources.   Therapeutic Modalities Cognitive Behavioral Therapy Motivational Interviewing  Chad [name], LCSWA 04/06/2021  5:00 PM

## 2021-04-07 NOTE — Group Note (Signed)
LCSW Group Therapy Note  Group Date: 04/07/2021 Start Time: 1500 End Time: 1545   Type of Therapy and Topic:  Group Therapy - How To Cope with Nervousness about Discharge   Participation Level:  Active   Description of Group This process group involved identification of patients' feelings about discharge. Some of them are scheduled to be discharged soon, while others are new admissions, but each of them was asked to share thoughts and feelings surrounding discharge from the hospital. One common theme was that they are excited at the prospect of going home, while another was that many of them are apprehensive about sharing why they were hospitalized. Patients were given the opportunity to discuss these feelings with their peers in preparation for discharge.  Therapeutic Goals  Patient will identify their overall feelings about pending discharge. Patient will think about how they might proactively address issues that they believe will once again arise once they get home (i.e. with parents). Patients will participate in discussion about having hope for change.   Summary of Patient Progress: Patient was present for the duration of group and actively participated in topic of discussion. Patient expressed how the holidays are a challenging time for him due to November being the anniversary of his mother's passing many years ago.    Therapeutic Modalities Cognitive Behavioral Therapy   Marletta Lor 04/07/2021  4:49 PM
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
## 2021-04-07 NOTE — Progress Notes (Signed)
Patient remain alert and oriented x4. Cooperative and compliant. Complain of right rib cage pain rating 8/10 medicated with PRN Oxycodone 5 mg. Report anxiety and depression rating 7/10. Denies SI, HI, AVH. Medications compliant . Ate breakfast in the day room among staff and peers with good appetite. Remain ambulatory around the unit with front wheel walker. Q15 minutes checks maintained. Patient remain safe on the unit at this time.
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
## 2021-04-08 MED ORDER — MIRTAZAPINE 15 MG PO TABS
15.0000 mg | ORAL_TABLET | Freq: Every day | ORAL | Status: DC
  Administered 2021-04-08 – 2021-04-19 (×12): 15 mg via ORAL
  Filled 2021-04-08 (×12): qty 1

## 2021-04-08 NOTE — Progress Notes (Addendum)
Patient is seen in bedroom asleep. Pt is alert and oriented x4. Pt rated anxiety and depression a 7/10. Pt rated pain in r. Rib cage a 7/10. PRN oxycodone 5 mg medication administered as per MAR orders. Pt is medication compliant. Pt denies SI, HI, and AVH. Pt remains safe on the unit at this time.

## 2021-04-08 NOTE — Plan of Care (Signed)
Patient is A&Ox4.  Affect is sullen, mood depressed and behavior withdrawn and isolative.  Refused breakfast and lunch stating "I'm just not hungry."  Denies AVH, SI, or HI.  Rates both anxiety and depression 7/10.  Reports chronic right rib cage pain 7/10 with relief reported 1 hour after Oxycodone.  VSS.  Patient compliant with all scheduled meds.  Ongoing Q15 minute safety check rounds per unit protocol.  Problem: Education: Goal: Knowledge of [place] [facility] Education information/materials will improve Outcome: Progressing   Problem: Education: Goal: Mental status will improve Outcome: Not Progressing

## 2021-04-08 NOTE — Progress Notes (Signed)
Recreation Therapy Notes  INPATIENT RECREATION TR PLAN  Patient Details Name: Chad [name] MRN: [id number] DOB: 1951/01/12 Today's Date: 04/08/2021  Rec Therapy Plan Is patient appropriate for Therapeutic Recreation?: Yes Treatment times per week: at least 3 Estimated Length of Stay: 5-7 days TR Treatment/Interventions: Group participation (Comment)  Discharge Criteria Pt will be discharged from therapy if:: Discharged Treatment plan/goals/alternatives discussed and agreed upon by:: Patient/family  Discharge Summary     Adline Kirshenbaum 04/08/2021, 3:32 PM
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
## 2021-04-08 NOTE — Progress Notes (Signed)
Recreation Therapy Notes  INPATIENT RECREATION THERAPY ASSESSMENT  Patient Details Name: Chad [name] MRN: [id number] DOB: 08/06/1950 Today's Date: 04/08/2021       Information Obtained From: Patient  Able to Participate in Assessment/Interview: Yes  Patient Presentation: Responsive  Reason for Admission (Per Patient): Active Symptoms  Patient Stressors:    Coping Skills:   Substance Abuse  Leisure Interests (2+):   (None)  Frequency of Recreation/Participation:    Awareness of Community Resources:  No  Community Resources:     Current Use: No  If no, Barriers?:    Expressed Interest in [employer] Information: No  [place]idence[place]  Patient Main Form of Transportation: Therapist, music  Patient Strengths:  Math  Patient Identified Areas of Improvement:  Everything  Patient Goal for Hospitalization:  Put myself in a better place  Current SI (including self-harm):  No  Current HI:  No  Current AVH: No  Staff Intervention Plan: Group Attendance, Collaborate with Interdisciplinary Treatment Team  Consent to Intern Participation: N/A  Bard Haupert 04/08/2021, 3:27 PM

## 2021-04-08 NOTE — BH IP Treatment Plan (Signed)
Interdisciplinary Treatment and Diagnostic Plan Update  04/08/2021 Time of Session: 2:30PM Ermias Tomeo MRN: [id number]  Principal Diagnosis: Major depressive disorder, recurrent severe without psychotic features ([place])  Secondary Diagnoses: Principal Problem:   Major depressive disorder, recurrent severe without psychotic features ([place])   Current Medications:  Current Facility-Administered Medications  Medication Dose Route Frequency Provider Last Rate Last Admin   acetaminophen (TYLENOL) tablet 650 mg  650 mg Oral Q6H PRN Patrecia Pour, NP       alum & mag hydroxide-simeth (MAALOX/MYLANTA) 200-200-20 MG/5ML suspension 30 mL  30 mL Oral Q4H PRN Patrecia Pour, NP       citalopram (CELEXA) tablet 20 mg  20 mg Oral Daily Patrecia Pour, NP   20 mg at 04/08/21 0902   feeding supplement (ENSURE ENLIVE / ENSURE PLUS) liquid 237 mL  237 mL Oral BID BM Patrecia Pour, NP   237 mL at 82/50/53 9767   folic acid (FOLVITE) tablet 1 mg  1 mg Oral Daily Patrecia Pour, NP   1 mg at 04/08/21 3419   loperamide (IMODIUM) capsule 2 mg  2 mg Oral Q6H PRN Patrecia Pour, NP       magnesium hydroxide (MILK OF MAGNESIA) suspension 30 mL  30 mL Oral Daily PRN Patrecia Pour, NP       mirtazapine (REMERON) tablet 15 mg  15 mg Oral QHS Parks Ranger, DO       oxyCODONE (Oxy IR/ROXICODONE) immediate release tablet 5 mg  5 mg Oral Q6H PRN He, Jun, MD   5 mg at 04/08/21 0903   pantoprazole (PROTONIX) EC tablet 40 mg  40 mg Oral BID Patrecia Pour, NP   40 mg at 04/08/21 0903   sucralfate (CARAFATE) 1 GM/10ML suspension 1 g  1 g Oral TID WC & HS Patrecia Pour, NP   1 g at 04/07/21 2108   PTA Medications: Medications Prior to Admission  Medication Sig Dispense Refill Last Dose   acetaminophen (TYLENOL) 325 MG tablet Take 2 tablets (650 mg total) by mouth every 6 (six) hours as needed for mild pain (pain score 1-3 or temp > 100.5).      citalopram (CELEXA) 20 MG tablet Take 1 tablet (20 mg  total) by mouth daily. 30 tablet 1    feeding supplement (ENSURE ENLIVE / ENSURE PLUS) LIQD Take 237 mLs by mouth 2 (two) times daily between meals. 37902 mL 0    folic acid (FOLVITE) 1 MG tablet Take 1 tablet (1 mg total) by mouth daily. 30 tablet 1    Multiple Vitamin (MULTIVITAMIN WITH MINERALS) TABS tablet Take 1 tablet by mouth daily. (Patient not taking: Reported on 03/06/2021) 30 tablet 0    nicotine (NICODERM CQ - DOSED IN MG/24 HOURS) 14 mg/24hr patch Place 1 patch (14 mg total) onto the skin daily. 28 patch 0    ondansetron (ZOFRAN) 4 MG tablet Take 1 tablet (4 mg total) by mouth every 6 (six) hours as needed for nausea. 20 tablet 0    oxyCODONE (OXY IR/ROXICODONE) 5 MG immediate release tablet Take 1 tablet (5 mg total) by mouth every 4 (four) hours as needed for severe pain or moderate pain. 30 tablet 0    pantoprazole (PROTONIX) 40 MG tablet Take 1 tablet (40 mg total) by mouth 2 (two) times daily.      sucralfate (CARAFATE) 1 GM/10ML suspension Take 10 mLs (1 g total) by mouth 4 (four) times daily -  with meals and at bedtime. 420 mL 0    thiamine 100 MG tablet Take 1 tablet (100 mg total) by mouth daily. 30 tablet 1    traZODone (DESYREL) 100 MG tablet Take 1 tablet (100 mg total) by mouth at bedtime. 30 tablet 1     Patient Stressors: Other: Anxiety and depression    Patient Strengths: Other: Unable to specify at this time  Treatment Modalities: Medication Management, Group therapy, Case management,  1 to 1 session with clinician, Psychoeducation, Recreational therapy.   Physician Treatment Plan for Primary Diagnosis: Major depressive disorder, recurrent severe without psychotic features ([place]) Long Term Goal(s): Improvement in symptoms so as ready for discharge   Short Term Goals: Ability to identify changes in lifestyle to reduce recurrence of condition will improve Ability to verbalize feelings will improve Ability to disclose and discuss suicidal ideas Ability to  demonstrate self-control will improve Ability to identify and develop effective coping behaviors will improve Ability to maintain clinical measurements within normal limits will improve Compliance with prescribed medications will improve Ability to identify triggers associated with substance abuse/mental health issues will improve  Medication Management: Evaluate patient's response, side effects, and tolerance of medication regimen.  Therapeutic Interventions: 1 to 1 sessions, Unit Group sessions and Medication administration.  Evaluation of Outcomes: Not Met  Physician Treatment Plan for Secondary Diagnosis: Principal Problem:   Major depressive disorder, recurrent severe without psychotic features ([place])  Long Term Goal(s): Improvement in symptoms so as ready for discharge   Short Term Goals: Ability to identify changes in lifestyle to reduce recurrence of condition will improve Ability to verbalize feelings will improve Ability to disclose and discuss suicidal ideas Ability to demonstrate self-control will improve Ability to identify and develop effective coping behaviors will improve Ability to maintain clinical measurements within normal limits will improve Compliance with prescribed medications will improve Ability to identify triggers associated with substance abuse/mental health issues will improve     Medication Management: Evaluate patient's response, side effects, and tolerance of medication regimen.  Therapeutic Interventions: 1 to 1 sessions, Unit Group sessions and Medication administration.  Evaluation of Outcomes: Not Met   RN Treatment Plan for Primary Diagnosis: Major depressive disorder, recurrent severe without psychotic features ([place]) Long Term Goal(s): Knowledge of disease and therapeutic regimen to maintain health will improve  Short Term Goals: Ability to remain free from injury will improve, Ability to verbalize frustration and anger appropriately will  improve, Ability to demonstrate self-control, Ability to participate in decision making will improve, Ability to verbalize feelings will improve, Ability to identify and develop effective coping behaviors will improve, and Compliance with prescribed medications will improve  Medication Management: RN will administer medications as ordered by provider, will assess and evaluate patient's response and provide education to patient for prescribed medication. RN will report any adverse and/or side effects to prescribing provider.  Therapeutic Interventions: 1 on 1 counseling sessions, Psychoeducation, Medication administration, Evaluate responses to treatment, Monitor vital signs and CBGs as ordered, Perform/monitor CIWA, COWS, AIMS and Fall Risk screenings as ordered, Perform wound care treatments as ordered.  Evaluation of Outcomes: Not Met   LCSW Treatment Plan for Primary Diagnosis: Major depressive disorder, recurrent severe without psychotic features ([place]) Long Term Goal(s): Safe transition to appropriate next level of care at discharge, Engage patient in therapeutic group addressing interpersonal concerns.  Short Term Goals: Engage patient in aftercare planning with referrals and resources, Increase social support, Increase ability to appropriately verbalize feelings, Increase emotional regulation, Facilitate acceptance  of mental health diagnosis and concerns, Facilitate patient progression through stages of change regarding substance use diagnoses and concerns, Identify triggers associated with mental health/substance abuse issues, and Increase skills for wellness and recovery  Therapeutic Interventions: Assess for all discharge needs, 1 to 1 time with Social worker, Explore available resources and support systems, Assess for adequacy in community support network, Educate family and significant other(s) on suicide prevention, Complete Psychosocial Assessment, Interpersonal group  therapy.  Evaluation of Outcomes: Not Met   Progress in Treatment: Attending groups: No. Participating in groups: No. Taking medication as prescribed: Yes. Toleration medication: Yes. Family/Significant other contact made: No, will contact:  pt declined permission Patient understands diagnosis: Yes. Discussing patient identified problems/goals with staff: Yes. Medical problems stabilized or resolved: Yes. Denies suicidal/homicidal ideation: Yes. Issues/concerns per patient self-inventory: No. Other: None  New problem(s) identified: No, Describe:  None  New Short Term/Long Term Goal(s):  medication management for mood stabilization; elimination of SI thoughts; development of comprehensive mental wellness/sobriety plan.   Patient Goals:  "get out of here"  Discharge Plan or Barriers: CSW will assist pt with development of appropriate discharge/aftercare plan  Reason for Continuation of Hospitalization: Depression Medication stabilization Suicidal ideation  Estimated Length of Stay: TBD   Scribe for Treatment Team: Charliene Inoue A [place], LCSWA 04/08/2021 2:59 PM

## 2021-04-08 NOTE — Progress Notes (Signed)
Recreation Therapy Notes   Date: 04/08/2021  Time: 1:25pm     Location:  Craft room    Behavioral response: N/A   Intervention Topic: Problem Solving   Discussion/Intervention: Patient did not attend group.  Clinical Observations/Feedback:  Patient did not attend group.   Daleon Willinger LRT/CTRS         Jaja Switalski 04/08/2021 2:57 PM
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
## 2021-04-09 NOTE — Progress Notes (Signed)
Patient alert and oriented. Patient isolative to room. Minimal interaction with Clinical research associate during assessment. Patient rates pain 8/10. Patient rates anxiety and depression 7/10. Patient denies SI/HI/AVH.  Patient compliant with medication administration. Q15 minute safety checks maintained. Patient remains safe on the unit at this time.

## 2021-04-09 NOTE — Progress Notes (Signed)
Pt lying in bed with eyes closed; easily aroused when name called. Pt states "alright, I guess" when asked how he is feeling. Pt c/o "always" having R "rib" pain, which he describes as "sharp, pulsing" and rates 8/10 on 0-10 pain scale. Pt accepted offer of tylenol for pain. Pt denies SI/HI/AVH at this time. Pt states that he sleeps "alright" but has trouble falling asleep and staying asleep some nights. He says "I'm supposed to have trazodone." Pt informed that he does not have trazodone ordered but he insists that he has been getting trazodone every night; Adela Lank, NP notified. Pt reports that his appetite is "not so good"; evening snack offered and provided. Pt expresses feelings of anxiety and depression for unknown reasons; he rates both 6/10. No acute distress noted.

## 2021-04-09 NOTE — Group Note (Signed)
[facility] LCSW Group Therapy Note ° ° °Group Date: 04/09/2021 °Start Time: 1330 °End Time: 1415 ° °Type of Therapy/Topic:  Group Therapy:  Feelings about Diagnosis ° °Participation Level:  Did Not Attend  ° ° ° °Description of Group:   ° This group will allow patients to explore their thoughts and feelings about diagnoses they have received. Patients will be guided to explore their level of understanding and acceptance of these diagnoses. Facilitator will encourage patients to process their thoughts and feelings about the reactions of others to their diagnosis, and will guide patients in identifying ways to discuss their diagnosis with significant others in their lives. This group will be process-oriented, with patients participating in exploration of their own experiences as well as giving and receiving support and challenge from other group members. ° ° °Therapeutic Goals: °1. Patient will demonstrate understanding of diagnosis as evidence by identifying two or more symptoms of the disorder:  °2. Patient will be able to express two feelings regarding the diagnosis °3. Patient will demonstrate ability to communicate their needs through discussion and/or role plays ° °Summary of Patient Progress: ° °X ° ° ° °Therapeutic Modalities:   °Cognitive Behavioral Therapy °Brief Therapy °Feelings Identification  ° ° °Nayomi Tabron A Emlyn Maves, LCSWA °

## 2021-04-09 NOTE — Plan of Care (Addendum)
Patient is A&Ox4.  Affect is sullen, mood depressed and behavior cooperative.  Patient reports sleeping well and appetite fair.  Denies AVH, SI, or HI.  Rates both anxiety and depression 7/10.  Reports chronic right shoulder pain 8/10 with relief reported 1 hour after Oxycodone.  VSS.  Patient compliant with all scheduled meds.  Pt more engaged this morning at breakfast and withdrawn, isolative to room this afternoon.  Declined group therapy.  Ongoing Q15 minute safety check rounds per unit protocol.  Problem: Education: Goal: Knowledge of [facility] Education information/materials will improve Outcome: Progressing Goal: Mental status will improve Outcome: Progressing Goal: Verbalization of understanding the information provided will improve Outcome: Progressing   Problem: Coping: Goal: Ability to verbalize frustrations and anger appropriately will improve Outcome: Progressing

## 2021-04-10 MED ORDER — METOPROLOL SUCCINATE ER 25 MG PO TB24
25.0000 mg | ORAL_TABLET | Freq: Every day | ORAL | Status: DC
  Administered 2021-04-10 – 2021-04-26 (×15): 25 mg via ORAL
  Filled 2021-04-10 (×17): qty 1

## 2021-04-10 MED ORDER — TRAZODONE HCL 50 MG PO TABS
100.0000 mg | ORAL_TABLET | Freq: Every day | ORAL | Status: DC
  Administered 2021-04-10 – 2021-04-11 (×2): 100 mg via ORAL
  Filled 2021-04-10 (×2): qty 2

## 2021-04-10 NOTE — Group Note (Signed)
[facility] LCSW Group Therapy Note   Group Date: 04/10/2021 Start Time: 1330 End Time: 1400   Type of Therapy/Topic:  Group Therapy:  Emotion Regulation  Participation Level:  Active     Description of Group:    The purpose of this group is to assist patients in learning to regulate negative emotions and experience positive emotions. Patients will be guided to discuss ways in which they have been vulnerable to their negative emotions. These vulnerabilities will be juxtaposed with experiences of positive emotions or situations, and patients challenged to use positive emotions to combat negative ones. Special emphasis will be placed on coping with negative emotions in conflict situations, and patients will process healthy conflict resolution skills.  Therapeutic Goals: Patient will identify two positive emotions or experiences to reflect on in order to balance out negative emotions:  Patient will label two or more emotions that they find the most difficult to experience:  Patient will be able to demonstrate positive conflict resolution skills through discussion or role plays:   Summary of Patient Progress:   Patient was present for the entirety of the  individual session. Patient was an active listener and participated in the topic of discussion. He said that he was interested in therapy care. He said that he knows about AA meetings but has not been interested in attending as of now. He said that he would also potentially like to discuss outpatient treatment options. He said one of his biggest concerns is his pain and that is one of the most difficult aspects to manage.      Therapeutic Modalities:   Cognitive Behavioral Therapy Feelings Identification Dialectical Behavioral Therapy   Sevan Mcbroom A [place], LCSWA
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
## 2021-04-10 NOTE — Group Note (Unsigned)
LCSW Group Therapy Note ° ° °Group Date: 04/10/2021 °Start Time: 1300 °End Time: 1400 ° ° °Type of Therapy and Topic:  Group Therapy: Boundaries ° °Participation Level:  {[facility] PARTICIPATION LEVEL:22264} ° °Description of Group: °This group will address the use of boundaries in their personal lives. Patients will explore why boundaries are important, the difference between healthy and unhealthy boundaries, and negative and postive outcomes of different boundaries and will look at how boundaries can be crossed.  Patients will be encouraged to identify current boundaries in their own lives and identify what kind of boundary is being set. Facilitators will guide patients in utilizing problem-solving interventions to address and correct types boundaries being used and to address when no boundary is being used. Understanding and applying boundaries will be explored and addressed for obtaining and maintaining a balanced life. Patients will be encouraged to explore ways to assertively make their boundaries and needs known to significant others in their lives, using other group members and facilitator for role play, support, and feedback. ° °Therapeutic Goals: ° °1.  Patient will identify areas in their life where setting clear boundaries could be  used to improve their life.  °2.  Patient will identify signs/triggers that a boundary is not being respected. °3.  Patient will identify two ways to set boundaries in order to achieve balance in  their lives: °4.  Patient will demonstrate ability to communicate their needs and set boundaries  through discussion and/or role plays ° °Summary of Patient Progress:  *** was ***present/active throughout the session and proved open to feedback from CSW and peers. Patient demonstrated *** insight into the subject matter, was respectful of peers, and was present throughout the entire session. ° °Therapeutic Modalities:   °Cognitive Behavioral Therapy °Solution-Focused Therapy ° °Liliana W  Eliott Amparan, LCSWA °04/10/2021  2:54 PM   ° °

## 2021-04-10 NOTE — Progress Notes (Signed)
Patient is alert and oriented x4. Calm and cooperative. Verbalize anxiety and depression a 6/10. Denies SI, HI, AVH. Verbalize pain of 4/10. Compliant with medications. Ate breakfast in the day room among staff and peers and tolerated well. Remain safe on the unit with q15 minutes safety checks.

## 2021-04-10 NOTE — Progress Notes (Signed)
Pt standing at nurses' station requesting pain medication; calm, cooperative. Pt states that he is feeling "not good". When asked the reason, pt states "I just don't feel well." Pt c/o R rib pain, which he describes as "sharp, pulsing" and rates 8/10 on 0-10 pain scale. Pt denies SI/HI/AVH at this time. Pt states that he has not been sleeping "very well" because he has problems falling asleep and staying asleep. Pt expresses anxiety due to pain and depression, both of which he rates 6/10. Pt requests nighttime medications at this time; pt informed that nighttime medications are not due at this time as it is too early. No acute distress noted.

## 2021-04-11 NOTE — Group Note (Signed)
[facility] LCSW Group Therapy Note ° ° °Group Date: 04/11/2021 °Start Time: 1315 °End Time: 1330 ° ° °Type of Therapy/Topic:  Group Therapy:  Balance in Life ° °Participation Level:  Did Not Attend  ° °Description of Group:   ° This group will address the concept of balance and how it feels and looks when one is unbalanced. Patients will be encouraged to process areas in their lives that are out of balance, and identify reasons for remaining unbalanced. Facilitators will guide patients utilizing problem- solving interventions to address and correct the stressor making their life unbalanced. Understanding and applying boundaries will be explored and addressed for obtaining  and maintaining a balanced life. Patients will be encouraged to explore ways to assertively make their unbalanced needs known to significant others in their lives, using other group members and facilitator for support and feedback. ° °Therapeutic Goals: °Patient will identify two or more emotions or situations they have that consume much of in their lives. °Patient will identify signs/triggers that life has become out of balance:  °Patient will identify two ways to set boundaries in order to achieve balance in their lives:  °Patient will demonstrate ability to communicate their needs through discussion and/or role plays ° °Summary of Patient Progress: ° ° ° °X ° ° ° °Therapeutic Modalities:   °Cognitive Behavioral Therapy °Solution-Focused Therapy °Assertiveness Training ° ° °Levelle Edelen A Rudra Hobbins, LCSWA °

## 2021-04-11 NOTE — Progress Notes (Signed)
Recreation Therapy Notes     Date: 04/11/2021  Time: 1:00pm   Location:  Craft room    Behavioral response: N/A   Intervention Topic: Life Planning    Discussion/Intervention: Patient did not attend group.  Clinical Observations/Feedback:  Patient did not attend group.   Malaysha Arlen LRT/CTRS           Mira Balon 04/11/2021 2:34 PM
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
## 2021-04-11 NOTE — Plan of Care (Signed)
Patient is A&Ox4.  Affect is sullen, mood depressed and behavior cooperative.  Patient reports sleeping fair and appetite good.  Denies AVH, SI, or HI.  Rates both anxiety and depression 5/10.  Reports chronic right shoulder pain 8/10 with relief reported 1 hour after Oxycodone.  VSS.  Patient compliant with all scheduled meds.  Pt spent most of day sitting in day room watching TV or in bed. Ongoing Q15 minute safety check rounds per unit protocol.  Problem: Education: Goal: Knowledge of [facility] Education information/materials will improve Outcome: Progressing Goal: Mental status will improve Outcome: Progressing Goal: Verbalization of understanding the information provided will improve Outcome: Progressing   Problem: Coping: Goal: Ability to verbalize frustrations and anger appropriately will improve Outcome: Progressing

## 2021-04-11 NOTE — Progress Notes (Signed)
Pt lying in bed with eyes open; calm, cooperative. Pt states "I'm just waiting on my medicine." Pt c/o R shoulder pain and R rib pain, which he describes both as "sharp" and rates overall pain 8/10 on 0-10 pain scale. Pt denies SI/HI/AVH at this time. Pt reports that he sleep is "at times, pretty good since I been taking the trazodone with the mirtazepine" and says that his appetite is "getting better". Pt denies anxiety and depression at this time. No acute distress noted.

## 2021-04-12 MED ORDER — HYDROXYZINE HCL 25 MG PO TABS
25.0000 mg | ORAL_TABLET | Freq: Three times a day (TID) | ORAL | Status: DC | PRN
  Administered 2021-04-12 – 2021-04-26 (×13): 25 mg via ORAL
  Filled 2021-04-12 (×13): qty 1

## 2021-04-12 MED ORDER — TRAZODONE HCL 50 MG PO TABS
150.0000 mg | ORAL_TABLET | Freq: Every day | ORAL | Status: DC
  Administered 2021-04-12 – 2021-04-25 (×14): 150 mg via ORAL
  Filled 2021-04-12 (×14): qty 3

## 2021-04-12 NOTE — Progress Notes (Signed)
Pt was all day in the dayroom watching TV.

## 2021-04-12 NOTE — Progress Notes (Signed)
Patient remain alert oriented x4. Denies SI, HI, AVH. Denies anxiety and depression at this time. Verbalize pain 7/10, medicated with PRN Oxycodone. Compliant with all due medication. Ate breakfast in the day room among staff and peers. Remain ambulatory with front wheel walker. Remain safe on the unit with q15 minute safety checks.

## 2021-04-12 NOTE — Progress Notes (Signed)
Patient is seen in the day room eating snack and interacting appropriately with others. Pt appears as calm and is cooperative. Pt rates pain in right rib cage 7/10. Pt rated anxiety and depression 6/10. Pt denies SI, HI, and AVH at this time.  Pt remains safe on the unit at this time.
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
## 2021-04-12 NOTE — Plan of Care (Signed)
  Problem: Group Participation Goal: STG - Patient will engage in groups without prompting or encouragement from LRT x3 group sessions within 5 recreation therapy group sessions Description: STG - Patient will engage in groups without prompting or encouragement from LRT x3 group sessions within 5 recreation therapy group sessions Outcome: Not Progressing   

## 2021-04-12 NOTE — Progress Notes (Signed)
Recreation Therapy Notes ° ° °Date: 04/12/2021 ° °Time: 1:00pm  ° °Location:  Craft room   ° °Behavioral response: N/A °  °Intervention Topic: Values ° °Discussion/Intervention: °Patient did not attend group. ° °Clinical Observations/Feedback:  °Patient did not attend group. °  °Chad [name] LRT/CTRS ° ° ° ° ° ° ° ° °Chad [name] °04/12/2021 2:56 PM °

## 2021-04-13 NOTE — BH IP Treatment Plan (Signed)
Interdisciplinary Treatment and Diagnostic Plan Update  04/13/2021 Time of Session: 11:00AM Chad [name] MRN: [id number]  Principal Diagnosis: Major depressive disorder, recurrent severe without psychotic features (HCC)  Secondary Diagnoses: Principal Problem:   Major depressive disorder, recurrent severe without psychotic features (HCC)   Current Medications:  Current Facility-Administered Medications  Medication Dose Route Frequency Provider Last Rate Last Admin   acetaminophen (TYLENOL) tablet 650 mg  650 mg Oral Q6H PRN Charm Rings, NP   650 mg at 04/09/21 2143   alum & mag hydroxide-simeth (MAALOX/MYLANTA) 200-200-20 MG/5ML suspension 30 mL  30 mL Oral Q4H PRN Charm Rings, NP       citalopram (CELEXA) tablet 20 mg  20 mg Oral Daily Charm Rings, NP   20 mg at 04/13/21 1000   feeding supplement (ENSURE ENLIVE / ENSURE PLUS) liquid 237 mL  237 mL Oral BID BM Charm Rings, NP   237 mL at 04/13/21 1001   folic acid (FOLVITE) tablet 1 mg  1 mg Oral Daily Charm Rings, NP   1 mg at 04/13/21 1000   hydrOXYzine (ATARAX) tablet 25 mg  25 mg Oral TID PRN Thalia Party, MD   25 mg at 04/13/21 0919   loperamide (IMODIUM) capsule 2 mg  2 mg Oral Q6H PRN Charm Rings, NP       magnesium hydroxide (MILK OF MAGNESIA) suspension 30 mL  30 mL Oral Daily PRN Charm Rings, NP       metoprolol succinate (TOPROL-XL) 24 hr tablet 25 mg  25 mg Oral Daily Sarina Ill, DO   25 mg at 04/13/21 1000   mirtazapine (REMERON) tablet 15 mg  15 mg Oral QHS Sarina Ill, DO   15 mg at 04/12/21 2109   oxyCODONE (Oxy IR/ROXICODONE) immediate release tablet 5 mg  5 mg Oral Q6H PRN He, Jun, MD   5 mg at 04/13/21 0732   pantoprazole (PROTONIX) EC tablet 40 mg  40 mg Oral BID Charm Rings, NP   40 mg at 04/13/21 1000   sucralfate (CARAFATE) 1 GM/10ML suspension 1 g  1 g Oral TID WC & HS Charm Rings, NP   1 g at 04/13/21 1143   traZODone (DESYREL) tablet 150 mg  150 mg  Oral QHS Thalia Party, MD   150 mg at 04/12/21 2109   PTA Medications: Medications Prior to Admission  Medication Sig Dispense Refill Last Dose   acetaminophen (TYLENOL) 325 MG tablet Take 2 tablets (650 mg total) by mouth every 6 (six) hours as needed for mild pain (pain score 1-3 or temp > 100.5).      citalopram (CELEXA) 20 MG tablet Take 1 tablet (20 mg total) by mouth daily. 30 tablet 1    feeding supplement (ENSURE ENLIVE / ENSURE PLUS) LIQD Take 237 mLs by mouth 2 (two) times daily between meals. 14220 mL 0    folic acid (FOLVITE) 1 MG tablet Take 1 tablet (1 mg total) by mouth daily. 30 tablet 1    Multiple Vitamin (MULTIVITAMIN WITH MINERALS) TABS tablet Take 1 tablet by mouth daily. (Patient not taking: Reported on 03/06/2021) 30 tablet 0    nicotine (NICODERM CQ - DOSED IN MG/24 HOURS) 14 mg/24hr patch Place 1 patch (14 mg total) onto the skin daily. 28 patch 0    ondansetron (ZOFRAN) 4 MG tablet Take 1 tablet (4 mg total) by mouth every 6 (six) hours as needed for nausea. 20 tablet 0  oxyCODONE (OXY IR/ROXICODONE) 5 MG immediate release tablet Take 1 tablet (5 mg total) by mouth every 4 (four) hours as needed for severe pain or moderate pain. 30 tablet 0    pantoprazole (PROTONIX) 40 MG tablet Take 1 tablet (40 mg total) by mouth 2 (two) times daily.      sucralfate (CARAFATE) 1 GM/10ML suspension Take 10 mLs (1 g total) by mouth 4 (four) times daily -  with meals and at bedtime. 420 mL 0    thiamine 100 MG tablet Take 1 tablet (100 mg total) by mouth daily. 30 tablet 1    traZODone (DESYREL) 100 MG tablet Take 1 tablet (100 mg total) by mouth at bedtime. 30 tablet 1     Patient Stressors: Other: Anxiety and depression    Patient Strengths: Other: Unable to specify at this time  Treatment Modalities: Medication Management, Group therapy, Case management,  1 to 1 session with clinician, Psychoeducation, Recreational therapy.   Physician Treatment Plan for Primary Diagnosis:  Major depressive disorder, recurrent severe without psychotic features (HCC) Long Term Goal(s): Improvement in symptoms so as ready for discharge   Short Term Goals: Ability to identify changes in lifestyle to reduce recurrence of condition will improve Ability to verbalize feelings will improve Ability to disclose and discuss suicidal ideas Ability to demonstrate self-control will improve Ability to identify and develop effective coping behaviors will improve Ability to maintain clinical measurements within normal limits will improve Compliance with prescribed medications will improve Ability to identify triggers associated with substance abuse/mental health issues will improve  Medication Management: Evaluate patient's response, side effects, and tolerance of medication regimen.  Therapeutic Interventions: 1 to 1 sessions, Unit Group sessions and Medication administration.  Evaluation of Outcomes: Progressing  Physician Treatment Plan for Secondary Diagnosis: Principal Problem:   Major depressive disorder, recurrent severe without psychotic features (HCC)  Long Term Goal(s): Improvement in symptoms so as ready for discharge   Short Term Goals: Ability to identify changes in lifestyle to reduce recurrence of condition will improve Ability to verbalize feelings will improve Ability to disclose and discuss suicidal ideas Ability to demonstrate self-control will improve Ability to identify and develop effective coping behaviors will improve Ability to maintain clinical measurements within normal limits will improve Compliance with prescribed medications will improve Ability to identify triggers associated with substance abuse/mental health issues will improve     Medication Management: Evaluate patient's response, side effects, and tolerance of medication regimen.  Therapeutic Interventions: 1 to 1 sessions, Unit Group sessions and Medication administration.  Evaluation of Outcomes:  Progressing   RN Treatment Plan for Primary Diagnosis: Major depressive disorder, recurrent severe without psychotic features (HCC) Long Term Goal(s): Knowledge of disease and therapeutic regimen to maintain health will improve  Short Term Goals: Ability to remain free from injury will improve, Ability to verbalize frustration and anger appropriately will improve, Ability to demonstrate self-control, Ability to participate in decision making will improve, Ability to verbalize feelings will improve, Ability to disclose and discuss suicidal ideas, Ability to identify and develop effective coping behaviors will improve, and Compliance with prescribed medications will improve  Medication Management: RN will administer medications as ordered by provider, will assess and evaluate patient's response and provide education to patient for prescribed medication. RN will report any adverse and/or side effects to prescribing provider.  Therapeutic Interventions: 1 on 1 counseling sessions, Psychoeducation, Medication administration, Evaluate responses to treatment, Monitor vital signs and CBGs as ordered, Perform/monitor CIWA, COWS, AIMS and Fall Risk  screenings as ordered, Perform wound care treatments as ordered.  Evaluation of Outcomes: Progressing   LCSW Treatment Plan for Primary Diagnosis: Major depressive disorder, recurrent severe without psychotic features (HCC) Long Term Goal(s): Safe transition to appropriate next level of care at discharge, Engage patient in therapeutic group addressing interpersonal concerns.  Short Term Goals: Engage patient in aftercare planning with referrals and resources, Increase social support, Increase ability to appropriately verbalize feelings, Increase emotional regulation, Facilitate acceptance of mental health diagnosis and concerns, Facilitate patient progression through stages of change regarding substance use diagnoses and concerns, Identify triggers associated with  mental health/substance abuse issues, and Increase skills for wellness and recovery  Therapeutic Interventions: Assess for all discharge needs, 1 to 1 time with Social worker, Explore available resources and support systems, Assess for adequacy in community support network, Educate family and significant other(s) on suicide prevention, Complete Psychosocial Assessment, Interpersonal group therapy.  Evaluation of Outcomes: Progressing   Progress in Treatment: Attending groups: No. Participating in groups: No. Taking medication as prescribed: Yes. Toleration medication: Yes. Family/Significant other contact made: No, will contact:  pt declined permission. Patient understands diagnosis: Yes. Discussing patient identified problems/goals with staff: Yes. Medical problems stabilized or resolved: Yes. Denies suicidal/homicidal ideation: Yes. Issues/concerns per patient self-inventory: No. Other: none.  New problem(s) identified: No, Describe:  None.  New Short Term/Long Term Goal(s): medication management for mood stabilization; elimination of SI thoughts; development of comprehensive mental wellness/sobriety plan. Update 04/13/2021: No changes at this time.   Patient Goals:  "get out of here" Update 04/13/2021: No changes at this time.   Discharge Plan or Barriers: CSW will assist pt with development of appropriate discharge/aftercare plan Update 04/13/2021: No changes at this time.   Reason for Continuation of Hospitalization: Depression Medication stabilization Suicidal ideation  Estimated Length of Stay: TBD   Scribe for Treatment Team: Marletta Lor 04/13/2021 11:49 AM
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
## 2021-04-13 NOTE — Group Note (Signed)
LCSW Group Therapy Note  Group Date: 04/13/2021 Start Time: 1420 End Time: 1620   Type of Therapy and Topic:  Group Therapy - Healthy vs Unhealthy Coping Skills  Participation Level:  Minimal   Description of Group The focus of this group was to determine what unhealthy coping techniques typically are used by group members and what healthy coping techniques would be helpful in coping with various problems. Patients were guided in becoming aware of the differences between healthy and unhealthy coping techniques. Patients were asked to identify 2-3 healthy coping skills they would like to learn to use more effectively.  Therapeutic Goals Patients learned that coping is what human beings do all day long to deal with various situations in their lives Patients defined and discussed healthy vs unhealthy coping techniques Patients identified their preferred coping techniques and identified whether these were healthy or unhealthy Patients determined 2-3 healthy coping skills they would like to become more familiar with and use more often. Patients provided support and ideas to each other   Summary of Patient Progress: Patient was presented a few minutes late to the group session. Patient was an active listener. Patient did not participate in the group activity but appeared to be engaged throughout the session. Patient expressed how he can engage in negative self talk and shared that he often tells himself, "don't hurt yourself" to challenge his perspective.    Therapeutic Modalities Cognitive Behavioral Therapy Motivational Interviewing  Marletta Lor 04/13/2021  5:39 PM

## 2021-04-13 NOTE — Progress Notes (Signed)
Patient is alert and oriented x4. Able to make needs known staff. Remain ambulatory around the unit with front wheel walker. Denies depression, verbalized anxiety 7/10. Report pain 7/10 medicated with PRN oxycodone. Denies SI, HI, and AVH. Compliant with all due medications. Ate breakfast in the day room among staff and peers with good appetite. Remain safe on the unit with q15 minutes safety checks.

## 2021-04-14 NOTE — Group Note (Signed)
LCSW Group Therapy Note  Group Date: 04/14/2021 Start Time: 1600 End Time: 1630   Type of Therapy and Topic:  Group Therapy - How To Cope with Nervousness about Discharge   Participation Level:  Active   Description of Group This process group involved identification of patients' feelings about discharge. Some of them are scheduled to be discharged soon, while others are new admissions, but each of them was asked to share thoughts and feelings surrounding discharge from the hospital. One common theme was that they are excited at the prospect of going home, while another was that many of them are apprehensive about sharing why they were hospitalized. Patients were given the opportunity to discuss these feelings with their peers in preparation for discharge.  Therapeutic Goals  Patient will identify their overall feelings about pending discharge. Patient will think about how they might proactively address issues that they believe will once again arise once they get home (i.e. with parents). Patients will participate in discussion about having hope for change.   Summary of Patient Progress: CSW met with patient one on one to discuss discharge planning. Patient shared he does not feel ready to leave the hospital at this time. However, upon discharge patient shared that he would like to receive psychiatric medication management and attend outpatient therapy at a clinic in [place]. Patient reports his niece lives in the same apartment complex as him and may be able to assist patient with transportation to and from appointments.    Therapeutic Modalities Cognitive Behavioral Therapy   Berniece Salines, Latanya Presser 04/14/2021  5:03 PM

## 2021-04-14 NOTE — Progress Notes (Signed)
Patient is seen in bed asleep. Pt c/o pain rating 7/10 on right rib cage. Pt stated the pain is relieved for about an hour after receiving oxycodone 5 mg PRN. Pt c/o anxiety and depression rated 6/10. Pt denied SI, HI, and AVH. Pt remains safe on the unit at this time.
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
## 2021-04-14 NOTE — Progress Notes (Signed)
Patient denies SI, HI, and AVH. He rates depression and anxiety as a 7/10. Patient also rates right rib pain as a 7/10. PRN oxycodone was given. Patient was also given PRN Vistaril with scheduled morning medications. Patient is compliant with all medications. Patient remains safe on the unit at this time.
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
## 2021-04-15 NOTE — Plan of Care (Signed)
See progress note Problem: Education: Goal: Knowledge of [facility] Education information/materials will improve 04/15/2021 2331 by Ardelle Anton, RN Outcome: Progressing 04/15/2021 2331 by Ardelle Anton, RN Outcome: Progressing Goal: Mental status will improve 04/15/2021 2331 by Ardelle Anton, RN Outcome: Progressing 04/15/2021 2331 by Ardelle Anton, RN Outcome: Progressing Goal: Verbalization of understanding the information provided will improve 04/15/2021 2331 by Ardelle Anton, RN Outcome: Progressing 04/15/2021 2331 by Ardelle Anton, RN Outcome: Progressing   Problem: Coping: Goal: Ability to verbalize frustrations and anger appropriately will improve 04/15/2021 2331 by Ardelle Anton, RN Outcome: Progressing 04/15/2021 2331 by Ardelle Anton, RN Outcome: Progressing

## 2021-04-15 NOTE — Progress Notes (Signed)
I assumed care for Chad [name] at about 19:30. Pt was resting in bed, awake. He reports rib cage pain rated 5 to 6 out of 10, he received prn Oxycodone as ordered, pt verbalized pain relief on follow up. No behavioral problems overnight, he denied any avh/hi/si though he continues to endorse depression and anxiety. He does not feel ready for discharge at this time. No falls, pt uses a walker for ambulation. He has remained mostly isolative to his room this shift. He did not come for snacks. He is being monitored as ordered.

## 2021-04-15 NOTE — Progress Notes (Signed)
Patient denies SI, HI, and AVH, but continues to endorse depression and anxiety, which he rates as a 6/10. Patient is observed to be in the dayroom before and after breakfast and is interacting appropriately with others. Patient is compliant with scheduled medications. Patient also requests PRN Vistaril. Patient remains safe on the unit at this time.

## 2021-04-15 NOTE — Progress Notes (Signed)
Recreation Therapy Notes   Date: 04/15/2021  Time: 1:35 pm   Location: Craft room    Behavioral response: Appropriate   Intervention Topic: Relaxation    Discussion/Intervention:  Group content today was focused on relaxation. The group defined relaxation and identified healthy ways to relax. Individuals expressed how much time they spend relaxing. Patients expressed how much their life would be if they did not make time for themselves to relax. The group stated ways they could improve their relaxation techniques in the future.  Individuals participated in the intervention Time to Relax where they had a chance to experience different relaxation techniques.   Clinical Observations/Feedback: Patient came to group defined relaxation as a state of mind being content. He stated that the place, atmosphere, and mood all effect how someone relaxes. Individual was social with peers and staff while participating in the intervention.  Princess Karnes LRT/CTRS         Anjel Perfetti 04/15/2021 2:37 PM

## 2021-04-15 NOTE — Progress Notes (Signed)
Pt in bed all of shift, I woke him up for him medications. Pt denies SI/HI and AVH. Pt for discharge in the am and he was sleepy and did not want to talk. No signs of emotional distress. No complaints of pains.

## 2021-04-15 NOTE — Plan of Care (Signed)
°  Problem: Education: Goal: Knowledge of [facility] Education information/materials will improve Outcome: Progressing Goal: Mental status will improve Outcome: Progressing Goal: Verbalization of understanding the information provided will improve Outcome: Progressing   Problem: Coping: Goal: Ability to verbalize frustrations and anger appropriately will improve Outcome: Progressing

## 2021-04-16 MED ORDER — VENLAFAXINE HCL ER 75 MG PO CP24
75.0000 mg | ORAL_CAPSULE | Freq: Every day | ORAL | Status: DC
  Administered 2021-04-17: 08:00:00 75 mg via ORAL
  Filled 2021-04-16: qty 1
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
## 2021-04-16 NOTE — Progress Notes (Signed)
Recreation Therapy Notes ° °Date: 04/16/2021 °  °Time: 1:35 pm  °  °Location:  Craft room    °  °Behavioral response: N/A °  °Intervention Topic: Stress Management °  °Discussion/Intervention: °Patient did not attend group. °  °Clinical Observations/Feedback:  °Patient did not attend group. °  °Eleanore Junio LRT/CTRS ° ° ° ° ° ° ° °Almedia Cordell °04/16/2021 2:46 PM °

## 2021-04-16 NOTE — Progress Notes (Signed)
D: Pt alert and oriented. Pt rates his anxiety 8/10 and depression 6/10. Pt rates his RU chronic rib pain 8/10. Pt denies experiencing any SI/HI, or AVH. A: Scheduled medications administered as ordered, oxycodone IR 5 mg po prn q 6h given at 0740 and 1340 for c/o chronic rib pain. Atarax 25 mg po prn TID given at 0950 for anxiety patient rated 8/10. Support and encouragement provided. Frequent verbal contact made. Routine safety checks conducted q15 minutes.  R: No adverse drug reactions noted. Patient reported some relief from administered prns for pain/anxiety. Pt is agreeably to notifying staff with any safety concerns. Pt interacts appropriately with others on the unit. Pt remains safe at this time. Will continue to monitor.

## 2021-04-16 NOTE — Group Note (Signed)
[facility] LCSW Group Therapy Note ° ° °Group Date: 04/16/2021 °Start Time: 1405 °End Time: 1415 ° °Type of Therapy/Topic:  Group Therapy:  Feelings about Diagnosis ° °Participation Level:  Did Not Attend  ° ° ° ° °Description of Group:   ° This group will allow patients to explore their thoughts and feelings about diagnoses they have received. Patients will be guided to explore their level of understanding and acceptance of these diagnoses. Facilitator will encourage patients to process their thoughts and feelings about the reactions of others to their diagnosis, and will guide patients in identifying ways to discuss their diagnosis with significant others in their lives. This group will be process-oriented, with patients participating in exploration of their own experiences as well as giving and receiving support and challenge from other group members. ° ° °Therapeutic Goals: °1. Patient will demonstrate understanding of diagnosis as evidence by identifying two or more symptoms of the disorder:  °2. Patient will be able to express two feelings regarding the diagnosis °3. Patient will demonstrate ability to communicate their needs through discussion and/or role plays ° °Summary of Patient Progress: ° ° ° °X ° ° ° °Therapeutic Modalities:   °Cognitive Behavioral Therapy °Brief Therapy °Feelings Identification  ° ° °Anavi Branscum A Chikita Dogan, LCSWA °
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
## 2021-04-17 MED ORDER — VENLAFAXINE HCL ER 75 MG PO CP24
150.0000 mg | ORAL_CAPSULE | Freq: Every day | ORAL | Status: DC
  Administered 2021-04-18 – 2021-04-21 (×4): 150 mg via ORAL
  Filled 2021-04-17 (×4): qty 2

## 2021-04-17 NOTE — Progress Notes (Signed)
Pt lying in bed with eyes open; calm, cooperative. Pt states "I'm alright." Pt c/o R rib pain, which he describes as "sharp" and rates 8/10 on 0-10 pain scale. Pt denies SI/HI/AVH at this time. Pt reports problems falling and staying asleep and describes his appetite as "fair". Pt expresses feelings of depression and anxiety, which he rates both 6/10 at this time. No acute distress noted.

## 2021-04-17 NOTE — Progress Notes (Signed)
D: Pt alert and oriented x 4. Pt rates his anxiety 8/10 and depression 6/10. Pt rates his right upper chronic rib pain 7/10. Pt denies experiencing any SI/HI, or AVH. A: Scheduled medications administered as ordered, oxycodone IR 5 mg po prn q 6h given at 1513 for c/o chronic rib pain. Atarax 25 mg po prn TID given at 0959 for anxiety rated 8/10. Support and encouragement provided. Frequent verbal contact made. Routine safety checks conducted q15 minutes.  R: No adverse drug reactions noted. Patient reported some relief from administered prns for pain/anxiety. Pt is agreeably to notifying staff with any safety concerns. Pt interacts appropriately with others on the unit. Pt remains safe at this time. Will continue to monitor.
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
## 2021-04-17 NOTE — Progress Notes (Signed)
Pt lying in bed with eyes open; calm, cooperative. Pt states that he feels "alright". Pt c/o "right rib cage" pain, which he describes as "sharp, pulsing" and rates 7/10 on 0-10 pain scale. Pt denies SI/HI/AVH at this time. Pt describes his sleep as "good" and his appetite as "fair". Pt states that his anxiety is "not so good" and when asked about his depression, he states "I don't know". No acute distress noted.

## 2021-04-17 NOTE — Plan of Care (Signed)
  Problem: Group Participation Goal: STG - Patient will engage in groups without prompting or encouragement from LRT x3 group sessions within 5 recreation therapy group sessions Description: STG - Patient will engage in groups without prompting or encouragement from LRT x3 group sessions within 5 recreation therapy group sessions Outcome: Not Progressing   

## 2021-04-17 NOTE — Progress Notes (Signed)
Recreation Therapy Notes    Date: 04/17/2021   Time: 1:15 pm    Location:  Craft room      Behavioral response: N/A   Intervention Topic: Values    Discussion/Intervention: Patient did not attend group.   Clinical Observations/Feedback:  Patient did not attend group.   Natina Wiginton LRT/CTRS       Betzabe Bevans 04/17/2021 2:00 PM

## 2021-04-17 NOTE — B[facility] Counselor (Signed)
CSW faxed referral for teletherapy to [facility].   CSW spoke with [facility] who provided her e-mail address to resend referral securely and make sure pt was in-network with insurance.   CSW will follow up regarding referral process at a later time.   Afnan Cadiente [place], MSW, LCSW-A 12/28/20224:27 PM

## 2021-04-18 NOTE — Progress Notes (Signed)
Recreation Therapy Notes    Date: 04/18/2021   Time: 1:15 pm    Location:  Craft room      Behavioral response: N/A   Intervention Topic: Strengths    Discussion/Intervention: Patient did not attend group.   Clinical Observations/Feedback:  Patient did not attend group.   Chad [name] LRT/CTRS        [name] 04/18/2021 2:19 PM

## 2021-04-18 NOTE — Progress Notes (Signed)
Pt lying in bed with eyes open; calm, cooperative. Pt states that he feels "alright". Pt c/o R rib pain, which he describes as "sharp, pulsing" and rates 7/10 on 0-10 pain scale. Pt denies SI/HI/AVH at this time. Pt states that he is sleeping "pretty good" and describes his appetite as "fair". Pt states "of course" when asked if he is experiencing anxiety and says "I think it's the pain" as the reason; he rates his anxiety 6/10. He also expresses feeling of depression, which he rates 7/10 and says that he is unsure of the reason. No acute distress noted.

## 2021-04-18 NOTE — BH IP Treatment Plan (Signed)
Interdisciplinary Treatment and Diagnostic Plan Update  04/18/2021 Time of Session: 9:30AM Chad [name] MRN: [id number]  Principal Diagnosis: Major depressive disorder, recurrent severe without psychotic features (HCC)  Secondary Diagnoses: Principal Problem:   Major depressive disorder, recurrent severe without psychotic features (HCC)   Current Medications:  Current Facility-Administered Medications  Medication Dose Route Frequency Provider Last Rate Last Admin   acetaminophen (TYLENOL) tablet 650 mg  650 mg Oral Q6H PRN Charm Rings, NP   650 mg at 04/09/21 2143   alum & mag hydroxide-simeth (MAALOX/MYLANTA) 200-200-20 MG/5ML suspension 30 mL  30 mL Oral Q4H PRN Charm Rings, NP       feeding supplement (ENSURE ENLIVE / ENSURE PLUS) liquid 237 mL  237 mL Oral BID BM Charm Rings, NP   237 mL at 04/18/21 4008   folic acid (FOLVITE) tablet 1 mg  1 mg Oral Daily Charm Rings, NP   1 mg at 04/18/21 6761   hydrOXYzine (ATARAX) tablet 25 mg  25 mg Oral TID PRN Thalia Party, MD   25 mg at 04/18/21 0746   loperamide (IMODIUM) capsule 2 mg  2 mg Oral Q6H PRN Charm Rings, NP       magnesium hydroxide (MILK OF MAGNESIA) suspension 30 mL  30 mL Oral Daily PRN Charm Rings, NP       metoprolol succinate (TOPROL-XL) 24 hr tablet 25 mg  25 mg Oral Daily Sarina Ill, DO   25 mg at 04/18/21 0925   mirtazapine (REMERON) tablet 15 mg  15 mg Oral QHS Sarina Ill, DO   15 mg at 04/17/21 2225   oxyCODONE (Oxy IR/ROXICODONE) immediate release tablet 5 mg  5 mg Oral Q6H PRN He, Jun, MD   5 mg at 04/18/21 0746   pantoprazole (PROTONIX) EC tablet 40 mg  40 mg Oral BID Charm Rings, NP   40 mg at 04/18/21 0926   sucralfate (CARAFATE) 1 GM/10ML suspension 1 g  1 g Oral TID WC & HS Charm Rings, NP   1 g at 04/18/21 1154   traZODone (DESYREL) tablet 150 mg  150 mg Oral QHS Thalia Party, MD   150 mg at 04/17/21 2225   venlafaxine XR (EFFEXOR-XR) 24 hr capsule 150  mg  150 mg Oral QPC breakfast Sarina Ill, DO   150 mg at 04/18/21 9509   PTA Medications: Medications Prior to Admission  Medication Sig Dispense Refill Last Dose   acetaminophen (TYLENOL) 325 MG tablet Take 2 tablets (650 mg total) by mouth every 6 (six) hours as needed for mild pain (pain score 1-3 or temp > 100.5).      citalopram (CELEXA) 20 MG tablet Take 1 tablet (20 mg total) by mouth daily. 30 tablet 1    feeding supplement (ENSURE ENLIVE / ENSURE PLUS) LIQD Take 237 mLs by mouth 2 (two) times daily between meals. 14220 mL 0    folic acid (FOLVITE) 1 MG tablet Take 1 tablet (1 mg total) by mouth daily. 30 tablet 1    Multiple Vitamin (MULTIVITAMIN WITH MINERALS) TABS tablet Take 1 tablet by mouth daily. (Patient not taking: Reported on 03/06/2021) 30 tablet 0    nicotine (NICODERM CQ - DOSED IN MG/24 HOURS) 14 mg/24hr patch Place 1 patch (14 mg total) onto the skin daily. 28 patch 0    ondansetron (ZOFRAN) 4 MG tablet Take 1 tablet (4 mg total) by mouth every 6 (six) hours as needed for nausea.  20 tablet 0    oxyCODONE (OXY IR/ROXICODONE) 5 MG immediate release tablet Take 1 tablet (5 mg total) by mouth every 4 (four) hours as needed for severe pain or moderate pain. 30 tablet 0    pantoprazole (PROTONIX) 40 MG tablet Take 1 tablet (40 mg total) by mouth 2 (two) times daily.      sucralfate (CARAFATE) 1 GM/10ML suspension Take 10 mLs (1 g total) by mouth 4 (four) times daily -  with meals and at bedtime. 420 mL 0    thiamine 100 MG tablet Take 1 tablet (100 mg total) by mouth daily. 30 tablet 1    traZODone (DESYREL) 100 MG tablet Take 1 tablet (100 mg total) by mouth at bedtime. 30 tablet 1     Patient Stressors: Other: Anxiety and depression    Patient Strengths: Other: Unable to specify at this time  Treatment Modalities: Medication Management, Group therapy, Case management,  1 to 1 session with clinician, Psychoeducation, Recreational therapy.   Physician  Treatment Plan for Primary Diagnosis: Major depressive disorder, recurrent severe without psychotic features (HCC) Long Term Goal(s): Improvement in symptoms so as ready for discharge   Short Term Goals: Ability to identify changes in lifestyle to reduce recurrence of condition will improve Ability to verbalize feelings will improve Ability to disclose and discuss suicidal ideas Ability to demonstrate self-control will improve Ability to identify and develop effective coping behaviors will improve Ability to maintain clinical measurements within normal limits will improve Compliance with prescribed medications will improve Ability to identify triggers associated with substance abuse/mental health issues will improve  Medication Management: Evaluate patient's response, side effects, and tolerance of medication regimen.  Therapeutic Interventions: 1 to 1 sessions, Unit Group sessions and Medication administration.  Evaluation of Outcomes: Adequate for Discharge  Physician Treatment Plan for Secondary Diagnosis: Principal Problem:   Major depressive disorder, recurrent severe without psychotic features (HCC)  Long Term Goal(s): Improvement in symptoms so as ready for discharge   Short Term Goals: Ability to identify changes in lifestyle to reduce recurrence of condition will improve Ability to verbalize feelings will improve Ability to disclose and discuss suicidal ideas Ability to demonstrate self-control will improve Ability to identify and develop effective coping behaviors will improve Ability to maintain clinical measurements within normal limits will improve Compliance with prescribed medications will improve Ability to identify triggers associated with substance abuse/mental health issues will improve     Medication Management: Evaluate patient's response, side effects, and tolerance of medication regimen.  Therapeutic Interventions: 1 to 1 sessions, Unit Group sessions and  Medication administration.  Evaluation of Outcomes: Adequate for Discharge   RN Treatment Plan for Primary Diagnosis: Major depressive disorder, recurrent severe without psychotic features (HCC) Long Term Goal(s): Knowledge of disease and therapeutic regimen to maintain health will improve  Short Term Goals: Ability to remain free from injury will improve, Ability to verbalize frustration and anger appropriately will improve, Ability to demonstrate self-control, Ability to participate in decision making will improve, Ability to verbalize feelings will improve, Ability to identify and develop effective coping behaviors will improve, and Compliance with prescribed medications will improve  Medication Management: RN will administer medications as ordered by provider, will assess and evaluate patient's response and provide education to patient for prescribed medication. RN will report any adverse and/or side effects to prescribing provider.  Therapeutic Interventions: 1 on 1 counseling sessions, Psychoeducation, Medication administration, Evaluate responses to treatment, Monitor vital signs and CBGs as ordered, Perform/monitor CIWA, COWS, AIMS  and Fall Risk screenings as ordered, Perform wound care treatments as ordered.  Evaluation of Outcomes: Adequate for Discharge   LCSW Treatment Plan for Primary Diagnosis: Major depressive disorder, recurrent severe without psychotic features (HCC) Long Term Goal(s): Safe transition to appropriate next level of care at discharge, Engage patient in therapeutic group addressing interpersonal concerns.  Short Term Goals: Engage patient in aftercare planning with referrals and resources, Increase social support, Increase ability to appropriately verbalize feelings, Increase emotional regulation, Facilitate acceptance of mental health diagnosis and concerns, Identify triggers associated with mental health/substance abuse issues, and Increase skills for wellness and  recovery  Therapeutic Interventions: Assess for all discharge needs, 1 to 1 time with Social worker, Explore available resources and support systems, Assess for adequacy in community support network, Educate family and significant other(s) on suicide prevention, Complete Psychosocial Assessment, Interpersonal group therapy.  Evaluation of Outcomes: Adequate for Discharge   Progress in Treatment: Attending groups: No. Participating in groups: No. Taking medication as prescribed: Yes. Toleration medication: Yes. Family/Significant other contact made: No, will contact:  pt declined permission for collateral contact Patient understands diagnosis: Yes. Discussing patient identified problems/goals with staff: Yes. Medical problems stabilized or resolved: Yes. Denies suicidal/homicidal ideation: Yes. Issues/concerns per patient self-inventory: No. Other: None  New problem(s) identified: No, Describe:  None  New Short Term/Long Term Goal(s): medication management for mood stabilization; elimination of SI thoughts; development of comprehensive mental wellness/sobriety plan. Update 04/13/2021: No changes at this time.  Update 04/18/21: No changes at this time.    Patient Goals:  "get out of here" Update 04/13/2021: No changes at this time. Update 04/18/21: No changes at this time.    Discharge Plan or Barriers: CSW will assist pt with development of appropriate discharge/aftercare plan Update 04/13/2021: No changes at this time. Update 04/18/21: No changes at this time.    Reason for Continuation of Hospitalization: Depression Medication stabilization  Estimated Length of Stay: TBD   Scribe for Treatment Team: Joshva Labreck A [place], Theresia Majors 04/18/2021 11:57 AM

## 2021-04-18 NOTE — Group Note (Signed)
[facility] LCSW Group Therapy Note   Group Date: 04/18/2021 Start Time: 1430 End Time: 1505   Type of Therapy/Topic:  Group Therapy:  Balance in Life  Participation Level:  Did Not Attend   Description of Group:    This group will address the concept of balance and how it feels and looks when one is unbalanced. Patients will be encouraged to process areas in their lives that are out of balance, and identify reasons for remaining unbalanced. Facilitators will guide patients utilizing problem- solving interventions to address and correct the stressor making their life unbalanced. Understanding and applying boundaries will be explored and addressed for obtaining  and maintaining a balanced life. Patients will be encouraged to explore ways to assertively make their unbalanced needs known to significant others in their lives, using other group members and facilitator for support and feedback.  Therapeutic Goals: Patient will identify two or more emotions or situations they have that consume much of in their lives. Patient will identify signs/triggers that life has become out of balance:  Patient will identify two ways to set boundaries in order to achieve balance in their lives:  Patient will demonstrate ability to communicate their needs through discussion and/or role plays  Summary of Patient Progress:    X    Therapeutic Modalities:   Cognitive Behavioral Therapy Solution-Focused Therapy Assertiveness Training   Taleya Whitcher A [place], LCSWA

## 2021-04-18 NOTE — Progress Notes (Signed)
Patient is calm and cooperative with assessment. He denies SI, HI, and AVH. He does endorse high depression and anxiety. Patient is compliant with scheduled medications. He also asks for Vistaril and Oxycodone PRN for anxiety and right rib pain, which he rates as a 7/10. Patient remains safe on the unit at this time.
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
## 2021-04-19 NOTE — Progress Notes (Signed)
Recreation Therapy Notes  Date: 04/19/2021  Time: 1:15 pm    Location: Courtyard     Behavioral response: N/A   Intervention Topic: Emotions    Discussion/Intervention: Patient did not attend group.  Clinical Observations/Feedback:  Patient did not attend group.   Lashonna Rieke LRT/CTRS          Jamonica Schoff 04/19/2021 2:40 PM

## 2021-04-19 NOTE — Progress Notes (Signed)
Patient is calm and cooperative with assessment. He denies SI, HI, and AVH. He endorses high anxiety and depression and states that he is not ready for discharge. Patient is unable to tell this writer why he feels he is not ready. Patient is compliant with all medications. Patient is active on the unit and is observed to be interacting appropriately with other patients and staff. Patient remains safe on the unit at this time.
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
## 2021-04-20 MED ORDER — MIRTAZAPINE 15 MG PO TABS
30.0000 mg | ORAL_TABLET | Freq: Every day | ORAL | Status: DC
  Administered 2021-04-20 – 2021-04-25 (×6): 30 mg via ORAL
  Filled 2021-04-20 (×6): qty 2

## 2021-04-20 NOTE — Group Note (Signed)
LCSW Group Therapy Note   Group Date: 04/20/2021 Start Time: 1435 End Time: 1535   Type of Therapy and Topic:  Group Therapy: Boundaries  Participation Level:  None  Description of Group: This group will address the use of boundaries in their personal lives. Patients will explore why boundaries are important, the difference between healthy and unhealthy boundaries, and negative and postive outcomes of different boundaries and will look at how boundaries can be crossed.  Patients will be encouraged to identify current boundaries in their own lives and identify what kind of boundary is being set. Facilitators will guide patients in utilizing problem-solving interventions to address and correct types boundaries being used and to address when no boundary is being used. Understanding and applying boundaries will be explored and addressed for obtaining and maintaining a balanced life. Patients will be encouraged to explore ways to assertively make their boundaries and needs known to significant others in their lives, using other group members and facilitator for role play, support, and feedback.  Therapeutic Goals:  1.  Patient will identify areas in their life where setting clear boundaries could be  used to improve their life.  2.  Patient will identify signs/triggers that a boundary is not being respected. 3.  Patient will identify two ways to set boundaries in order to achieve balance in  their lives: 4.  Patient will demonstrate ability to communicate their needs and set boundaries  through discussion and/or role plays  Summary of Patient Progress:   Patient was present in group, though he did not participate in discussion.   Therapeutic Modalities:   Cognitive Behavioral Therapy Solution-Focused Therapy  Chad [name], Chad [name] 04/20/2021  3:53 PM

## 2021-04-20 NOTE — Progress Notes (Signed)
Patient remain alert oriented x4. Able to make needs known to staff. During assessment patient report he did not sleep last night. Report pain 7/10. Denies anxiety and depression. Denies SI, HI, AVH. Ate breakfast in the day room among staff and peers with good appetite. Currently in his room resting in no apparent distress. Remain safe on the unit with q15 minute safety checks.
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
## 2021-04-20 NOTE — Progress Notes (Signed)
Patient c/o right rib cage pain rated 7/10. Pt also c/o anxiety and depression rated 7/10. Pt denies SI, HI, and AVH at this time. Pt remains safe on the unit at this time.

## 2021-04-20 NOTE — Progress Notes (Signed)
Patient is observed in bed sleeping. Pt c/o right rib cage pain rated 7/10. Pt c/o anxiety around being discharged. Pt denied SI, HI, and AVH. Pt remains safe on the unit at this time.

## 2021-04-21 MED ORDER — VENLAFAXINE HCL ER 75 MG PO CP24
225.0000 mg | ORAL_CAPSULE | Freq: Every day | ORAL | Status: DC
  Administered 2021-04-22 – 2021-04-26 (×5): 225 mg via ORAL
  Filled 2021-04-21 (×5): qty 3

## 2021-04-21 MED ORDER — VENLAFAXINE HCL ER 75 MG PO CP24
75.0000 mg | ORAL_CAPSULE | Freq: Once | ORAL | Status: AC
  Administered 2021-04-21: 75 mg via ORAL
  Filled 2021-04-21: qty 1

## 2021-04-21 NOTE — Progress Notes (Signed)
Patient is calm and cooperative with assessment. He denies SI, HI, and AVH. Patient endorses right rib pain and rated is at an 8/10. He also endorses anxiety related to possible discharge. Vistaril and oxycodone PRN were given. Patient is visible in the dayroom and interacts appropriately with staff and other patients on the unit. Patient is compliant with scheduled medications. Metoprolol was held due to pulse of 56. Patient remains safe on the unit at this time.

## 2021-04-21 NOTE — Group Note (Signed)
LCSW Group Therapy Note  Group Date: 04/21/2021 Start Time: 0320 End Time: 1615   Type of Therapy and Topic:  Group Therapy - How To Cope with Nervousness about Discharge   Participation Level:  Did Not Attend   Description of Group This process group involved identification of patients' feelings about discharge. Some of them are scheduled to be discharged soon, while others are new admissions, but each of them was asked to share thoughts and feelings surrounding discharge from the hospital. One common theme was that they are excited at the prospect of going home, while another was that many of them are apprehensive about sharing why they were hospitalized. Patients were given the opportunity to discuss these feelings with their peers in preparation for discharge.  Therapeutic Goals  Patient will identify their overall feelings about pending discharge. Patient will think about how they might proactively address issues that they believe will once again arise once they get home (i.e. with parents). Patients will participate in discussion about having hope for change.   Summary of Patient Progress: Patient did not attend group despite encouraged participation.   Therapeutic Modalities Cognitive Behavioral Therapy   Marletta Lor 04/21/2021  5:55 PM
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
## 2021-04-22 NOTE — Progress Notes (Signed)
Patient is alert and oriented times 4. Mood and affect appropriate. Patient rates pain as 7/10 to right rib cage. He denies SI, HI, and AVH. C/O anxiety 6/10 and depression 7/10. States he slept fair last night. Morning meds given whole by mouth W/O difficulty. Ate breakfast in day room- appetite good. Patient remains on unit with Q15 minute checks in place.

## 2021-04-22 NOTE — Progress Notes (Signed)
Patient alert and oriented x 4, affect is blunted thoughts are organized and coherent denies SI/HI/AVH, complaint with medication, no distress noted,15 minutes safety checks maintained.

## 2021-04-22 NOTE — Progress Notes (Signed)
Recreation Therapy Notes ° °Date: 04/22/2021 °  °Time: 1:15 pm  °  °Location:  Craft room    °  °Behavioral response: N/A °  °Intervention Topic: Values ° °Discussion/Intervention: °Patient unable to attend group. °  °Clinical Observations/Feedback:  °Patient unable to attend group. °  °[name] LRT/CTRS °  ° ° ° ° ° ° ° °Chad [name] °04/22/2021 1:53 PM °

## 2021-04-22 NOTE — Care Management Important Message (Signed)
Important Message  Patient Details  Name: Chad [name] MRN: [id number] Date of Birth: Dec 22, 1950   Medicare Important Message Given:  Yes Patient informed of right to appeal anticipated discharge on 04/23/2021, provided contact information for [facility]. Patient states that he intends to appeal. CSW has yet to be notified of appeal confirmation number. CSW will monitor progress of appeal. Situation ongoing, CSW will continue to monitor and update note as more information becomes available.      Corky Crafts, LCSWA 04/22/2021, 4:41 PM

## 2021-04-22 NOTE — Group Note (Signed)
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
## 2021-04-23 NOTE — Group Note (Signed)
[facility] LCSW Group Therapy Note ° ° °Group Date: 04/23/2021 °Start Time: 1430 °End Time: 1445 ° °Type of Therapy/Topic:  Group Therapy:  Feelings about Diagnosis ° °Participation Level:  Did Not Attend  ° ° ° °Description of Group:   ° This group will allow patients to explore their thoughts and feelings about diagnoses they have received. Patients will be guided to explore their level of understanding and acceptance of these diagnoses. Facilitator will encourage patients to process their thoughts and feelings about the reactions of others to their diagnosis, and will guide patients in identifying ways to discuss their diagnosis with significant others in their lives. This group will be process-oriented, with patients participating in exploration of their own experiences as well as giving and receiving support and challenge from other group members. ° ° °Therapeutic Goals: °1. Patient will demonstrate understanding of diagnosis as evidence by identifying two or more symptoms of the disorder:  °2. Patient will be able to express two feelings regarding the diagnosis °3. Patient will demonstrate ability to communicate their needs through discussion and/or role plays ° °Summary of Patient Progress: ° °X ° ° ° °Therapeutic Modalities:   °Cognitive Behavioral Therapy °Brief Therapy °Feelings Identification  ° ° °Chad [name] [name], LCSWA °

## 2021-04-23 NOTE — Plan of Care (Signed)
Patient is A&Ox4.   Affect is flat, mood calm and cooperative.  Denies AVH, SI, HI and pain.  Rates anxiety and depression 7/10.   Reports sleeping well and appetite good.  VSS.  Patient compliant with all scheduled meds.  Ongoing Q15 minute safety check rounds per unit protocol.  Problem: Education: Goal: Knowledge of [facility] Education information/materials will improve Outcome: Progressing Goal: Mental status will improve Outcome: Progressing Goal: Verbalization of understanding the information provided will improve Outcome: Progressing   Problem: Coping: Goal: Ability to verbalize frustrations and anger appropriately will improve Outcome: Progressing

## 2021-04-23 NOTE — Progress Notes (Signed)
Recreation Therapy Notes Date: 04/23/2021   Time: 1:00 pm     Location: Court yard    Behavioral response: Appropriate   Intervention Topic: Leisure    Discussion/Intervention:  Group content today was focused on leisure. The group defined what leisure is and some positive leisure activities they participate in. Individuals identified the difference between good and bad leisure. Participants expressed how they feel after participating in the leisure of their choice. The group discussed how they go about picking a leisure activity and if others are involved in their leisure activities. The patient stated how many leisure activities they have to choose from and reasons why it is important to have leisure time. Individuals participated in the intervention Exploration of Leisure where they had a chance to identify new leisure activities as well as benefits of leisure.   Clinical Observations/Feedback: Patient came to group late due to unknown reasons. Individual was social with peers and staff while participating in the intervention.    Ramadan Couey LRT/CTRS        Rolene Andrades 04/23/2021 2:15 PM
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
## 2021-04-23 NOTE — Progress Notes (Signed)
Patient alert and oriented x 4, affect is blunted thoughts are organized and coherent denies SI/HI/AVH, appears less anxious, interacting appropriately with peers and staff. complaint with medication, no distress noted,15 minutes safety checks maintained.

## 2021-04-23 NOTE — BH IP Treatment Plan (Signed)
Interdisciplinary Treatment and Diagnostic Plan Update  04/23/2021 Time of Session: 9:30am Chad [name] MRN: [id number]  Principal Diagnosis: Major depressive disorder, recurrent severe without psychotic features (HCC)  Secondary Diagnoses: Principal Problem:   Major depressive disorder, recurrent severe without psychotic features (HCC)   Current Medications:  Current Facility-Administered Medications  Medication Dose Route Frequency Provider Last Rate Last Admin   acetaminophen (TYLENOL) tablet 650 mg  650 mg Oral Q6H PRN Charm RingsLord, Jamison Y, NP   650 mg at 04/09/21 2143   alum & mag hydroxide-simeth (MAALOX/MYLANTA) 200-200-20 MG/5ML suspension 30 mL  30 mL Oral Q4H PRN Charm RingsLord, Jamison Y, NP       feeding supplement (ENSURE ENLIVE / ENSURE PLUS) liquid 237 mL  237 mL Oral BID BM Charm RingsLord, Jamison Y, NP   237 mL at 04/23/21 [id number]   folic acid (FOLVITE) tablet 1 mg  1 mg Oral Daily Charm RingsLord, Jamison Y, NP   1 mg at 04/23/21 [id number]   hydrOXYzine (ATARAX) tablet 25 mg  25 mg Oral TID PRN Thalia PartyPaliy, Alisa, MD   25 mg at 04/23/21 0931   loperamide (IMODIUM) capsule 2 mg  2 mg Oral Q6H PRN Charm RingsLord, Jamison Y, NP       magnesium hydroxide (MILK OF MAGNESIA) suspension 30 mL  30 mL Oral Daily PRN Charm RingsLord, Jamison Y, NP       metoprolol succinate (TOPROL-XL) 24 hr tablet 25 mg  25 mg Oral Daily Sarina IllHerrick, Richard Edward, DO   25 mg at 04/23/21 1024   mirtazapine (REMERON) tablet 30 mg  30 mg Oral QHS Thalia PartyPaliy, Alisa, MD   30 mg at 04/22/21 2159   oxyCODONE (Oxy IR/ROXICODONE) immediate release tablet 5 mg  5 mg Oral Q6H PRN He, Jun, MD   5 mg at 04/23/21 1023   pantoprazole (PROTONIX) EC tablet 40 mg  40 mg Oral BID Charm RingsLord, Jamison Y, NP   40 mg at 04/23/21 0909   sucralfate (CARAFATE) 1 GM/10ML suspension 1 g  1 g Oral TID WC & HS Charm RingsLord, Jamison Y, NP   1 g at 04/23/21 0820   traZODone (DESYREL) tablet 150 mg  150 mg Oral QHS Thalia PartyPaliy, Alisa, MD   150 mg at 04/22/21 2159   venlafaxine XR (EFFEXOR-XR) 24 hr capsule 225 mg  225 mg  Oral QPC breakfast Thalia PartyPaliy, Alisa, MD   225 mg at 04/23/21 0820   PTA Medications: Medications Prior to Admission  Medication Sig Dispense Refill Last Dose   acetaminophen (TYLENOL) 325 MG tablet Take 2 tablets (650 mg total) by mouth every 6 (six) hours as needed for mild pain (pain score 1-3 or temp > 100.5).      citalopram (CELEXA) 20 MG tablet Take 1 tablet (20 mg total) by mouth daily. 30 tablet 1    feeding supplement (ENSURE ENLIVE / ENSURE PLUS) LIQD Take 237 mLs by mouth 2 (two) times daily between meals. 14220 mL 0    folic acid (FOLVITE) 1 MG tablet Take 1 tablet (1 mg total) by mouth daily. 30 tablet 1    Multiple Vitamin (MULTIVITAMIN WITH MINERALS) TABS tablet Take 1 tablet by mouth daily. (Patient not taking: Reported on 03/06/2021) 30 tablet 0    nicotine (NICODERM CQ - DOSED IN MG/24 HOURS) 14 mg/24hr patch Place 1 patch (14 mg total) onto the skin daily. 28 patch 0    ondansetron (ZOFRAN) 4 MG tablet Take 1 tablet (4 mg total) by mouth every 6 (six) hours as needed for nausea. 20 tablet  0    oxyCODONE (OXY IR/ROXICODONE) 5 MG immediate release tablet Take 1 tablet (5 mg total) by mouth every 4 (four) hours as needed for severe pain or moderate pain. 30 tablet 0    pantoprazole (PROTONIX) 40 MG tablet Take 1 tablet (40 mg total) by mouth 2 (two) times daily.      sucralfate (CARAFATE) 1 GM/10ML suspension Take 10 mLs (1 g total) by mouth 4 (four) times daily -  with meals and at bedtime. 420 mL 0    thiamine 100 MG tablet Take 1 tablet (100 mg total) by mouth daily. 30 tablet 1    traZODone (DESYREL) 100 MG tablet Take 1 tablet (100 mg total) by mouth at bedtime. 30 tablet 1     Patient Stressors: Other: Anxiety and depression    Patient Strengths: Other: Unable to specify at this time  Treatment Modalities: Medication Management, Group therapy, Case management,  1 to 1 session with clinician, Psychoeducation, Recreational therapy.   Physician Treatment Plan for Primary  Diagnosis: Major depressive disorder, recurrent severe without psychotic features (HCC) Long Term Goal(s): Improvement in symptoms so as ready for discharge   Short Term Goals: Ability to identify changes in lifestyle to reduce recurrence of condition will improve Ability to verbalize feelings will improve Ability to disclose and discuss suicidal ideas Ability to demonstrate self-control will improve Ability to identify and develop effective coping behaviors will improve Ability to maintain clinical measurements within normal limits will improve Compliance with prescribed medications will improve Ability to identify triggers associated with substance abuse/mental health issues will improve  Medication Management: Evaluate patient's response, side effects, and tolerance of medication regimen.  Therapeutic Interventions: 1 to 1 sessions, Unit Group sessions and Medication administration.  Evaluation of Outcomes: Adequate for Discharge  Physician Treatment Plan for Secondary Diagnosis: Principal Problem:   Major depressive disorder, recurrent severe without psychotic features (HCC)  Long Term Goal(s): Improvement in symptoms so as ready for discharge   Short Term Goals: Ability to identify changes in lifestyle to reduce recurrence of condition will improve Ability to verbalize feelings will improve Ability to disclose and discuss suicidal ideas Ability to demonstrate self-control will improve Ability to identify and develop effective coping behaviors will improve Ability to maintain clinical measurements within normal limits will improve Compliance with prescribed medications will improve Ability to identify triggers associated with substance abuse/mental health issues will improve     Medication Management: Evaluate patient's response, side effects, and tolerance of medication regimen.  Therapeutic Interventions: 1 to 1 sessions, Unit Group sessions and Medication  administration.  Evaluation of Outcomes: Adequate for Discharge   RN Treatment Plan for Primary Diagnosis: Major depressive disorder, recurrent severe without psychotic features (HCC) Long Term Goal(s): Knowledge of disease and therapeutic regimen to maintain health will improve  Short Term Goals: Ability to remain free from injury will improve, Ability to verbalize frustration and anger appropriately will improve, Ability to demonstrate self-control, Ability to participate in decision making will improve, Ability to verbalize feelings will improve, Ability to disclose and discuss suicidal ideas, Ability to identify and develop effective coping behaviors will improve, and Compliance with prescribed medications will improve  Medication Management: RN will administer medications as ordered by provider, will assess and evaluate patient's response and provide education to patient for prescribed medication. RN will report any adverse and/or side effects to prescribing provider.  Therapeutic Interventions: 1 on 1 counseling sessions, Psychoeducation, Medication administration, Evaluate responses to treatment, Monitor vital signs and CBGs as  ordered, Perform/monitor CIWA, COWS, AIMS and Fall Risk screenings as ordered, Perform wound care treatments as ordered.  Evaluation of Outcomes: Adequate for Discharge   LCSW Treatment Plan for Primary Diagnosis: Major depressive disorder, recurrent severe without psychotic features (HCC) Long Term Goal(s): Safe transition to appropriate next level of care at discharge, Engage patient in therapeutic group addressing interpersonal concerns.  Short Term Goals: Engage patient in aftercare planning with referrals and resources, Increase social support, Increase ability to appropriately verbalize feelings, Increase emotional regulation, Facilitate acceptance of mental health diagnosis and concerns, Facilitate patient progression through stages of change regarding  substance use diagnoses and concerns, and Increase skills for wellness and recovery  Therapeutic Interventions: Assess for all discharge needs, 1 to 1 time with Social worker, Explore available resources and support systems, Assess for adequacy in community support network, Educate family and significant other(s) on suicide prevention, Complete Psychosocial Assessment, Interpersonal group therapy.  Evaluation of Outcomes: Adequate for Discharge   Progress in Treatment: Attending groups: Yes. Participating in groups: No. Taking medication as prescribed: Yes. Toleration medication: Yes. Family/Significant other contact made: No, will contact:  pt declined permission for collateral contact Patient understands diagnosis: Yes. Discussing patient identified problems/goals with staff: Yes. Medical problems stabilized or resolved: Yes. Denies suicidal/homicidal ideation: Yes. Issues/concerns per patient self-inventory: No. Other: None  New Short Term/Long Term Goal(s): medication management for mood stabilization; elimination of SI thoughts; development of comprehensive mental wellness/sobriety plan. Update 04/13/2021: No changes at this time.  Update 04/18/21: No changes at this time. Update 04/23/21: No changes at this time.    Patient Goals:  "get out of here" Update 04/13/2021: No changes at this time. Update 04/18/21: No changes at this time. 04/23/21: No changes at this time.    Discharge Plan or Barriers: CSW will assist pt with development of appropriate discharge/aftercare plan.  Update 04/13/2021: No changes at this time. Update 04/18/21: No changes at this time. 04/23/21: Pt has appealed his discharge and is waiting for [employer]. CSW will follow instructions on appeal process and continue discharge planning.    Reason for Continuation of Hospitalization: Depression Medication stabilization   Estimated Length of Stay: TBD    Scribe for Treatment Team: Arlethia Basso A [place],  Theresia Majors 04/23/2021 10:49 AM

## 2021-04-24 NOTE — Progress Notes (Signed)
Patient alert and oriented x 4, affect is blunted thoughts are organized and coherent denies SI/HI/AVH, he was less irritable, less anxious, interacting appropriately with peers and staff. complaint with medication, no distress noted,15 minutes safety checks maintained will continue to monitor.

## 2021-04-24 NOTE — Progress Notes (Signed)
Patient is alert and oriented x4. He denies SI, HI, and AVH but endorses high depression and anxiety. Patient says, "I don't want to talk about it." Patient is in the dayroom for breakfast but returns to his room after. He is compliant with scheduled medications. Patient also requested oxycodone PRN for 7/10 right rib pain and Vistaril for anxiety. Patient remains safe on the unit at this time.

## 2021-04-24 NOTE — Progress Notes (Signed)
Pt lying in bed with eyes open; calm, cooperative. Pt states "I feel pretty good." Pt c/o R rib pain, which he describes as "sharp" and rates 7/10 on 0-10 pain scale. Pt denies SI/HI/AVH at this time. Pt reports that he is sleeping "okay" and describes his appetite as "alright". Pt expresses feelings of anxiety and depression; he rates both 6/10. Pt initially stated that he did not know the cause of his depression and anxiety, but then states that he feels that the doctor is "pressuring" him to be discharged but he does not feel that he is ready. Pt provided with snack after taking meds. No acute distress noted.

## 2021-04-24 NOTE — Group Note (Signed)
[facility] LCSW Group Therapy Note ° ° °Group Date: 04/24/2021 °Start Time: 1245 °End Time: 1315 ° ° °Type of Therapy/Topic:  Group Therapy:  Emotion Regulation ° °Participation Level:  Did Not Attend  ° ° ° °Description of Group:   ° The purpose of this group is to assist patients in learning to regulate negative emotions and experience positive emotions. Patients will be guided to discuss ways in which they have been vulnerable to their negative emotions. These vulnerabilities will be juxtaposed with experiences of positive emotions or situations, and patients challenged to use positive emotions to combat negative ones. Special emphasis will be placed on coping with negative emotions in conflict situations, and patients will process healthy conflict resolution skills. ° °Therapeutic Goals: °Patient will identify two positive emotions or experiences to reflect on in order to balance out negative emotions:  °Patient will label two or more emotions that they find the most difficult to experience:  °Patient will be able to demonstrate positive conflict resolution skills through discussion or role plays:  ° °Summary of Patient Progress: ° ° °X ° ° ° °Therapeutic Modalities:   °Cognitive Behavioral Therapy °Feelings Identification °Dialectical Behavioral Therapy ° ° °Jya Hughston A Joriel Streety, LCSWA °
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
## 2021-04-24 NOTE — Progress Notes (Signed)
Recreation Therapy Notes   Date: 04/24/2021  Time: 1:15pm   Location: Craft room    Behavioral response: N/A   Intervention Topic: Coping Skills   Discussion/Intervention: Patient did not attend group.  Clinical Observations/Feedback:  Patient did not attend group.   Jathan Balling LRT/CTRS        Chauntay Paszkiewicz 04/24/2021 2:50 PM

## 2021-04-25 NOTE — Progress Notes (Signed)
Pt lying in bed with eyes open; calm, cooperative. Pt states "Ima try to get some sleep but we'll see", as loud snoring is heard from another patient's room. Pt c/o R rib pain that he describes as "better" and rates as 6/10. Pt denies SI/HI/AVH at this time. Pt states "Once I fell asleep, I could hardly wake up" when asked about how he slept last night. He describes his appetite as "alright". Pt expresses feelings of anxiety, which he rates 6/10 and depression, which he rates 4/10. He states that he is unsure of the reason for his anxiety and depression. Pt provided with evening snack. No acute distress noted.
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
## 2021-04-25 NOTE — Progress Notes (Signed)
Recreation Therapy Notes   Date: 04/25/2021  Time: 1:15pm    Location: Courtyard   Behavioral response: Appropriate  Intervention Topic: Social Skills   Discussion/Intervention:  Group content on today was focused on social skills. The group defined social skills and identified ways they use social skills. Patients expressed what obstacles they face when trying to be social. Participants described the importance of social skills. The group listed ways to improve social skills and reasons to improve social skills. Individuals had an opportunity to learn new and improve social skills as well as identify their weaknesses. Clinical Observations/Feedback: Patient came to group and was appropriate while focusing on the task at hand. Individual engaged in the intervention and was social with staff and peers.  Ronda Kazmi LRT/CTRS         Shondra Capps 04/25/2021 1:51 PM

## 2021-04-25 NOTE — Progress Notes (Signed)
Patient is alert and oriented x 4. Mood and affect is appropriate. Patient denies SI,HI, AVH and rates his depression and anxiety as 5/10.Patient states that he slept a little bit last night. Morning medication administered whole  by mouth without difficulty.ate breakfast in day room with good appetite. Patients rates his pain a 7/10 to right rib, prn med administered. Patient remains on unit with Q 15 minute checks in place.

## 2021-04-26 MED ORDER — HYDROXYZINE PAMOATE 25 MG PO CAPS: 25.0000 mg | ORAL_CAPSULE | Freq: Four times a day (QID) | ORAL | 2 refills | Status: DC | PRN

## 2021-04-26 MED ORDER — MIRTAZAPINE 30 MG PO TABS: 30.0000 mg | ORAL_TABLET | Freq: Every day | ORAL | 2 refills | Status: DC

## 2021-04-26 MED ORDER — VENLAFAXINE HCL ER 75 MG PO CP24: 225.0000 mg | ORAL_CAPSULE | Freq: Every day | ORAL | 2 refills | Status: DC

## 2021-04-26 MED ORDER — TRAZODONE HCL 150 MG PO TABS: 150.0000 mg | ORAL_TABLET | Freq: Every day | ORAL | 2 refills | Status: DC

## 2021-04-26 MED ORDER — METOPROLOL SUCCINATE ER 25 MG PO TB24: 25.0000 mg | ORAL_TABLET | Freq: Every day | ORAL | 2 refills | Status: DC

## 2021-04-26 NOTE — Progress Notes (Signed)
This writer went into patients room to take vital signs and he stated he would like vistaril. When asked why he needed vistaril he stated he was feeling nervous about going home. He rated his anxiety 3/10. Resident mad aware that he has Atarax PRN and not Vistaril, he was ok with that. PRN vistaril given, patient still states he is anxious.

## 2021-04-26 NOTE — Progress Notes (Signed)
Recreation Therapy Notes   Date: 04/26/2021  Time: 1:15pm    Location: Craft room    Behavioral response: N/A   Intervention Topic: Wellness   Discussion/Intervention: Patient did not attend group.  Clinical Observations/Feedback:  Patient did not attend group.   Tallulah Hosman LRT/CTRS        Chad [name] 04/26/2021 3:10 PM

## 2021-04-26 NOTE — [facility] Suicide Risk Assessment (Signed)
[facility] Discharge Suicide Risk Assessment   Principal Problem: Major depressive disorder, recurrent severe without psychotic features (HCC) Discharge Diagnoses: Principal Problem:   Major depressive disorder, recurrent severe without psychotic features (HCC)   Total Time spent with patient: 1 hour  Musculoskeletal: Strength & Muscle Tone: within normal limits Gait & Station: normal Patient leans: N/A  Psychiatric Specialty Exam  Presentation  General Appearance: Appropriate for Environment  Eye Contact:Good  Speech:Clear and Coherent; Normal Rate  Speech Volume:Normal  Handedness:Right   Mood and Affect  Mood:Angry; Depressed  Duration of Depression Symptoms: No data recorded Affect:Congruent; Blunt   Thought Process  Thought Processes:Coherent  Descriptions of Associations:Intact  Orientation:Full (Time, Place and Person)  Thought Content:Logical  History of Schizophrenia/Schizoaffective disorder:No data recorded Duration of Psychotic Symptoms:No data recorded Hallucinations:No data recorded Ideas of Reference:None  Suicidal Thoughts:No data recorded Homicidal Thoughts:No data recorded  Sensorium  Memory:Immediate Fair; Recent Fair  Judgment:Poor  Insight:Poor   Executive Functions  Concentration:Fair  Attention Span:Fair  Recall:Fair  Fund of Knowledge:Fair  Language:Fair   Psychomotor Activity  Psychomotor Activity:No data recorded  Assets  Assets:Resilience; Social Support   Sleep  Sleep:No data recorded  Physical Exam: Physical Exam Vitals and nursing note reviewed.  Constitutional:      Appearance: Normal appearance. He is normal weight.  Neurological:     General: No focal deficit present.     Mental Status: He is alert and oriented to person, place, and time.  Psychiatric:        Attention and Perception: Attention and perception normal.        Mood and Affect: Mood normal.        Behavior: Behavior normal. Behavior is  cooperative.        Thought Content: Thought content normal.        Cognition and Memory: Cognition and memory normal.        Judgment: Judgment normal.   Review of Systems  Constitutional: Negative.   HENT: Negative.    Eyes: Negative.   Respiratory: Negative.    Cardiovascular: Negative.   Gastrointestinal: Negative.   Genitourinary: Negative.   Musculoskeletal: Negative.   Skin: Negative.   Neurological: Negative.   Endo/Heme/Allergies: Negative.   Psychiatric/Behavioral: Negative.    Blood pressure 120/62, pulse 61, temperature 97.6 F (36.4 C), temperature source Oral, resp. rate 18, height 5\' 9"  (1.753 m), weight 71.2 kg, SpO2 97 %. Body mass index is 23.18 kg/m.  Mental Status Per Nursing Assessment::   On Admission:  NA  Demographic Factors:  Male, Age 69 or older, Caucasian, and Living alone  Loss Factors: Decrease in vocational status and Decline in physical health  Historical Factors: Prior suicide attempts and Impulsivity  Risk Reduction Factors:   NA  Continued Clinical Symptoms:  Depression:   Anhedonia Comorbid alcohol abuse/dependence Alcohol/Substance Abuse/Dependencies  Cognitive Features That Contribute To Risk:  Thought constriction (tunnel vision)    Suicide Risk:  Moderate:  Frequent suicidal ideation with limited intensity, and duration, some specificity in terms of plans, no associated intent, good self-control, limited dysphoria/symptomatology, some risk factors present, and identifiable protective factors, including available and accessible social support.   Follow-up Information     [facility] Follow up on 05/09/2021.   Why: You have an appointment scheduled for Thursday January 19th at 1:30pm. (in-person). Please contact the office to reschedule if needed. Contact information: Address: [address] [place], [place] Phone: [phone] Fax: [phone]  [facility] Follow up.   Why: [facility] will contact you today regarding your referral to schedule a telehealth (phone) appointment within the next 7 days. Thanks! Contact information: Main Office:  Phone: [phone]  Fax: [phone]  Email: [email or web address]  Address: [address], [address], [place], [place] [address]                Plan Of Care/Follow-up recommendations: F/U [facility]   Sarina Ill, DO 04/26/2021, 10:19 AM

## 2021-04-26 NOTE — Progress Notes (Signed)
Patient is alert and oriented times 4. Mood and affect is flat. States  he is not thrilled about the idea of going home today. Patient rates pain as 4/10 to right rib cage. He denies SI, HI, and AVH. Also denies feelings of anxiety and depression at this time. States he slept ok last night. Morning meds given whole by mouth W/O difficulty. Ate breakfast in day room- appetite good. Patient remains on unit with Q15 minute checks in place.

## 2021-04-26 NOTE — Progress Notes (Signed)
°  [facility] Adult Case Management Discharge Plan :  Will you be returning to the same living situation after discharge:  Yes,  pt will be returning to his home At discharge, do you have transportation home?: Yes,  CSW will assist pt in providing transporation Do you have the ability to pay for your medications: Yes,  pt has  [facility]  Release of information consent forms completed and in the chart;  Patient's signature needed at discharge.  Patient to Follow up at:  Follow-up Information     [facility] Follow up on 05/09/2021.   Why: You have an appointment scheduled for Thursday January 19th at 1:30pm. (in-person). Please contact the office to reschedule if needed. Contact information: Address: [address], [address] [place], [place] [address] Phone: [phone] Fax: [phone]        [facility] Follow up.   Why: [facility] will contact you regarding your referral to schedule a telehealth (phone) appointment. Thanks! Contact information: Main Office:  Phone: [phone]  Fax: [phone]  Email: [email or web address]  Address: [address], [place], [place] [address]                Next level of care provider has access to [facility] Link:yes  Safety Planning and Suicide Prevention discussed: Yes,  completed with pt     Has patient been referred to the Quitline?: Patient refused referral  Patient has been referred for addiction treatment: Pt. refused referral  Brandon Scarbrough A [place], LCSWA 04/26/2021, 9:35 AM

## 2021-04-26 NOTE — Plan of Care (Signed)
°  Problem: Group Participation Goal: STG - Patient will engage in groups without prompting or encouragement from LRT x3 group sessions within 5 recreation therapy group sessions Description: STG - Patient will engage in groups without prompting or encouragement from LRT x3 group sessions within 5 recreation therapy group sessions 04/26/2021 1512 by Alveria Apley, LRT Outcome: Adequate for Discharge 04/26/2021 1512 by Alveria Apley, LRT Outcome: Adequate for Discharge

## 2021-04-26 NOTE — Progress Notes (Signed)
Recreation Therapy Notes ° °INPATIENT RECREATION TR PLAN ° °Patient Details °Name: Chad [name] °MRN: [id number] °DOB: 02/27/1951 °Today's Date: 04/26/2021 ° °Rec Therapy Plan °Is patient appropriate for Therapeutic Recreation?: Yes °Treatment times per week: at least 3 °Estimated Length of Stay: 5-7 days °TR Treatment/Interventions: Group participation (Comment) ° °Discharge Criteria °Pt will be discharged from therapy if:: Discharged °Treatment plan/goals/alternatives discussed and agreed upon by:: Patient/family ° °Discharge Summary °Short term goals set: Patient will engage in groups without prompting or encouragement from LRT x3 group sessions within 5 recreation therapy group sessions °Short term goals met: Adequate for discharge °Progress toward goals comments: Groups attended °Which groups?: Social skills, Leisure education, Other (Comment) (Relaxation) °Reason goals not met: N/A °Therapeutic equipment acquired: N/A °Reason patient discharged from therapy: Discharge from hospital °Pt/family agrees with progress & goals achieved: Yes °Date patient discharged from therapy: 04/26/21 ° ° °   °04/26/2021, 3:13 PM °

## 2021-04-26 NOTE — Progress Notes (Signed)
°  [facility] Adult Case Management Discharge Plan :  Will you be returning to the same living situation after discharge:  Yes,  pt will be returning to his home At discharge, do you have transportation home?: Yes,  CSW will assist pt in providing transporation Do you have the ability to pay for your medications: Yes,  pt has  [facility]  Release of information consent forms completed and in the chart;  Patient's signature needed at discharge.  Patient to Follow up at:  Follow-up Information     [facility] Follow up on 05/09/2021.   Why: You have an appointment scheduled for Thursday January 19th at 1:30pm. (in-person). Please contact the office to reschedule if needed. Contact information: Address: [address] [place], [place] [address] Phone: [phone] Fax: [phone]        [facility] Follow up.   Why: [facility] will contact you today regarding your referral to schedule a telehealth (phone) appointment within the next 7 days. Thanks! Contact information: Main Office:  Phone: [phone]  Fax: [phone]  Email: [email or web address]  Address: [address], [address], [place], [place] [address]                Next level of care provider has access to [facility] Link:yes  Safety Planning and Suicide Prevention discussed: Yes,  completed with pt     Has patient been referred to the Quitline?: Patient refused referral  Patient has been referred for addiction treatment: Pt. refused referral  Zyniah Ferraiolo A [place], LCSWA 04/26/2021, 9:49 AM
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
## 2021-04-26 NOTE — Progress Notes (Signed)
Patient denies SI/HI and contracts for safety. Writer reviewed AVS and discharge documents with patient.  Patient verbalized understanding of information given.  Writer did give pt all belongings and AVS in hand.  Pt escorted by staff to main lobby where he was met by [facility] [employer] to drive him home safely.

## 2021-08-26 ENCOUNTER — Emergency Department: Payer: Medicare HMO

## 2021-08-26 ENCOUNTER — Inpatient Hospital Stay
Admission: EM | Admit: 2021-08-26 | Discharge: 2021-09-10 | DRG: 896 | Disposition: A | Discharge status: Skilled Nursing Facility | Payer: Medicare HMO | Attending: Internal Medicine | Admitting: Internal Medicine

## 2021-08-26 DIAGNOSIS — Z6822 Body mass index (BMI) 22.0-22.9, adult: Secondary | ICD-10-CM

## 2021-08-26 DIAGNOSIS — Z79899 Other long term (current) drug therapy: Secondary | ICD-10-CM

## 2021-08-26 DIAGNOSIS — F32A Depression, unspecified: Secondary | ICD-10-CM | POA: Diagnosis present

## 2021-08-26 DIAGNOSIS — R4182 Altered mental status, unspecified: Secondary | ICD-10-CM | POA: Diagnosis not present

## 2021-08-26 DIAGNOSIS — I10 Essential (primary) hypertension: Secondary | ICD-10-CM | POA: Diagnosis present

## 2021-08-26 DIAGNOSIS — F419 Anxiety disorder, unspecified: Secondary | ICD-10-CM | POA: Diagnosis present

## 2021-08-26 DIAGNOSIS — R627 Adult failure to thrive: Secondary | ICD-10-CM | POA: Diagnosis present

## 2021-08-26 DIAGNOSIS — R54 Age-related physical debility: Secondary | ICD-10-CM | POA: Diagnosis present

## 2021-08-26 DIAGNOSIS — Z20822 Contact with and (suspected) exposure to covid-19: Secondary | ICD-10-CM | POA: Diagnosis present

## 2021-08-26 DIAGNOSIS — L24A9 Irritant contact dermatitis due friction or contact with other specified body fluids: Secondary | ICD-10-CM | POA: Diagnosis present

## 2021-08-26 DIAGNOSIS — F10230 Alcohol dependence with withdrawal, uncomplicated: Principal | ICD-10-CM | POA: Diagnosis present

## 2021-08-26 DIAGNOSIS — G8929 Other chronic pain: Secondary | ICD-10-CM | POA: Diagnosis present

## 2021-08-26 DIAGNOSIS — R32 Unspecified urinary incontinence: Secondary | ICD-10-CM | POA: Diagnosis present

## 2021-08-26 DIAGNOSIS — Z7401 Bed confinement status: Secondary | ICD-10-CM

## 2021-08-26 DIAGNOSIS — Z8711 Personal history of peptic ulcer disease: Secondary | ICD-10-CM

## 2021-08-26 DIAGNOSIS — F10939 Alcohol use, unspecified with withdrawal, unspecified: Secondary | ICD-10-CM | POA: Diagnosis present

## 2021-08-26 DIAGNOSIS — K2210 Ulcer of esophagus without bleeding: Secondary | ICD-10-CM | POA: Diagnosis present

## 2021-08-26 DIAGNOSIS — R7989 Other specified abnormal findings of blood chemistry: Secondary | ICD-10-CM | POA: Diagnosis present

## 2021-08-26 DIAGNOSIS — K219 Gastro-esophageal reflux disease without esophagitis: Secondary | ICD-10-CM | POA: Diagnosis present

## 2021-08-26 DIAGNOSIS — E43 Unspecified severe protein-calorie malnutrition: Secondary | ICD-10-CM | POA: Diagnosis present

## 2021-08-26 DIAGNOSIS — K760 Fatty (change of) liver, not elsewhere classified: Secondary | ICD-10-CM | POA: Diagnosis present

## 2021-08-26 DIAGNOSIS — F17210 Nicotine dependence, cigarettes, uncomplicated: Secondary | ICD-10-CM | POA: Diagnosis present

## 2021-08-26 DIAGNOSIS — R300 Dysuria: Secondary | ICD-10-CM | POA: Diagnosis present

## 2021-08-26 DIAGNOSIS — E872 Acidosis, unspecified: Secondary | ICD-10-CM | POA: Diagnosis present

## 2021-08-26 DIAGNOSIS — E860 Dehydration: Secondary | ICD-10-CM | POA: Diagnosis present

## 2021-08-26 DIAGNOSIS — L89159 Pressure ulcer of sacral region, unspecified stage: Secondary | ICD-10-CM | POA: Diagnosis present

## 2021-08-26 DIAGNOSIS — E8809 Other disorders of plasma-protein metabolism, not elsewhere classified: Secondary | ICD-10-CM | POA: Diagnosis present

## 2021-08-26 DIAGNOSIS — Z811 Family history of alcohol abuse and dependence: Secondary | ICD-10-CM

## 2021-08-26 DIAGNOSIS — L259 Unspecified contact dermatitis, unspecified cause: Secondary | ICD-10-CM | POA: Diagnosis present

## 2021-08-26 DIAGNOSIS — F332 Major depressive disorder, recurrent severe without psychotic features: Secondary | ICD-10-CM | POA: Diagnosis present

## 2021-08-26 DIAGNOSIS — R64 Cachexia: Secondary | ICD-10-CM | POA: Diagnosis present

## 2021-08-26 LAB — RESP PANEL BY RT-PCR (FLU A&B, COVID) ARPGX2
Influenza A by PCR: NEGATIVE
Influenza B by PCR: NEGATIVE
SARS Coronavirus 2 by RT PCR: NEGATIVE

## 2021-08-26 LAB — COMPREHENSIVE METABOLIC PANEL
ALT: 24 U/L (ref 0–44)
AST: 68 U/L — ABNORMAL HIGH (ref 15–41)
Albumin: 2.3 g/dL — ABNORMAL LOW (ref 3.5–5.0)
Alkaline Phosphatase: 132 U/L — ABNORMAL HIGH (ref 38–126)
Anion gap: 9 (ref 5–15)
BUN: <5 mg/dL — ABNORMAL LOW (ref 8–23)
CO2: 25 mmol/L (ref 22–32)
Calcium: 8 mg/dL — ABNORMAL LOW (ref 8.9–10.3)
Chloride: 100 mmol/L (ref 98–111)
Creatinine, Ser: 0.8 mg/dL (ref 0.61–1.24)
GFR, Estimated: >60 mL/min (ref >60)
Glucose, Bld: 99 mg/dL (ref 70–99)
Potassium: 5 mmol/L (ref 3.5–5.1)
Sodium: 134 mmol/L — ABNORMAL LOW (ref 135–145)
Total Bilirubin: 1.1 mg/dL (ref 0.3–1.2)
Total Protein: 5.7 g/dL — ABNORMAL LOW (ref 6.5–8.1)

## 2021-08-26 LAB — CBC WITH DIFFERENTIAL/PLATELET
Abs Immature Granulocytes: 0.02 10*3/uL (ref 0.00–0.07)
Basophils Absolute: 0.1 10*3/uL (ref 0.0–0.1)
Basophils Relative: 1 %
Eosinophils Absolute: 0.2 10*3/uL (ref 0.0–0.5)
Eosinophils Relative: 4 %
HCT: 40.3 % (ref 39.0–52.0)
Hemoglobin: 12.8 g/dL — ABNORMAL LOW (ref 13.0–17.0)
Immature Granulocytes: 0 %
Lymphocytes Relative: 27 %
Lymphs Abs: 1.6 10*3/uL (ref 0.7–4.0)
MCH: 29.8 pg (ref 26.0–34.0)
MCHC: 31.8 g/dL (ref 30.0–36.0)
MCV: 93.7 fL (ref 80.0–100.0)
Monocytes Absolute: 0.7 10*3/uL (ref 0.1–1.0)
Monocytes Relative: 12 %
Neutro Abs: 3.3 10*3/uL (ref 1.7–7.7)
Neutrophils Relative %: 56 %
Platelets: 228 10*3/uL (ref 150–400)
RBC: 4.3 MIL/uL (ref 4.22–5.81)
RDW: 14.7 % (ref 11.5–15.5)
WBC: 5.9 10*3/uL (ref 4.0–10.5)
nRBC: 0 % (ref 0.0–0.2)

## 2021-08-26 LAB — BRAIN NATRIURETIC PEPTIDE: B Natriuretic Peptide: 89.3 pg/mL (ref 0.0–100.0)

## 2021-08-26 LAB — PROTIME-INR
INR: 1.2 (ref 0.8–1.2)
Prothrombin Time: 15.1 seconds (ref 11.4–15.2)

## 2021-08-26 LAB — LACTIC ACID, PLASMA: Lactic Acid, Venous: 3.5 mmol/L (ref 0.5–1.9)

## 2021-08-26 LAB — MAGNESIUM: Magnesium: 2.1 mg/dL (ref 1.7–2.4)

## 2021-08-26 LAB — TSH: TSH: 1.852 u[IU]/mL (ref 0.350–4.500)

## 2021-08-26 LAB — APTT: aPTT: 29 seconds (ref 24–36)

## 2021-08-26 LAB — CK: Total CK: 58 U/L (ref 49–397)

## 2021-08-26 LAB — AMMONIA: Ammonia: 20 umol/L (ref 9–35)

## 2021-08-26 MED ORDER — ENSURE ENLIVE PO LIQD
237.0000 mL | Freq: Two times a day (BID) | ORAL | Status: DC
  Administered 2021-08-28 – 2021-08-30 (×5): 237 mL via ORAL

## 2021-08-26 MED ORDER — METOPROLOL SUCCINATE ER 50 MG PO TB24: 25.0000 mg | ORAL_TABLET | Freq: Every day | ORAL | Status: DC

## 2021-08-26 MED ORDER — LORAZEPAM 2 MG/ML IJ SOLN
INTRAMUSCULAR | Status: AC
  Filled 2021-08-26: qty 1

## 2021-08-26 MED ORDER — HYDROXYZINE HCL 50 MG PO TABS
25.0000 mg | ORAL_TABLET | Freq: Four times a day (QID) | ORAL | Status: DC | PRN
  Administered 2021-08-28: 25 mg via ORAL
  Filled 2021-08-26 (×2): qty 1

## 2021-08-26 MED ORDER — METRONIDAZOLE 500 MG/100ML IV SOLN: 500.0000 mg | Freq: Once | INTRAVENOUS | Status: DC

## 2021-08-26 MED ORDER — VANCOMYCIN HCL IN DEXTROSE 1-5 GM/200ML-% IV SOLN: 1000.0000 mg | Freq: Once | INTRAVENOUS | Status: DC

## 2021-08-26 MED ORDER — LORAZEPAM 2 MG/ML IJ SOLN
2.0000 mg | Freq: Once | INTRAMUSCULAR | Status: AC
  Administered 2021-08-26: 2 mg via INTRAVENOUS

## 2021-08-26 MED ORDER — SUCRALFATE 1 GM/10ML PO SUSP
1.0000 g | Freq: Three times a day (TID) | ORAL | Status: DC
  Administered 2021-08-27 – 2021-09-10 (×52): 1 g via ORAL
  Filled 2021-08-26 (×54): qty 10

## 2021-08-26 MED ORDER — THIAMINE HCL 100 MG PO TABS
100.0000 mg | ORAL_TABLET | Freq: Every day | ORAL | Status: DC
  Administered 2021-08-28 – 2021-09-10 (×12): 100 mg via ORAL
  Filled 2021-08-26 (×15): qty 1

## 2021-08-26 MED ORDER — ADULT MULTIVITAMIN W/MINERALS CH
1.0000 | ORAL_TABLET | Freq: Every day | ORAL | Status: DC
  Administered 2021-08-26 – 2021-09-10 (×16): 1 via ORAL
  Filled 2021-08-26 (×15): qty 1

## 2021-08-26 MED ORDER — NICOTINE 21 MG/24HR TD PT24
21.0000 mg | MEDICATED_PATCH | Freq: Every day | TRANSDERMAL | Status: DC
  Administered 2021-08-26 – 2021-09-10 (×16): 21 mg via TRANSDERMAL
  Filled 2021-08-26 (×16): qty 1

## 2021-08-26 MED ORDER — METOPROLOL SUCCINATE ER 25 MG PO TB24
25.0000 mg | ORAL_TABLET | Freq: Every day | ORAL | Status: DC
  Administered 2021-08-26 – 2021-09-10 (×16): 25 mg via ORAL
  Filled 2021-08-26 (×16): qty 1

## 2021-08-26 MED ORDER — VENLAFAXINE HCL ER 75 MG PO CP24
225.0000 mg | ORAL_CAPSULE | Freq: Every day | ORAL | Status: DC
  Administered 2021-08-28 – 2021-09-10 (×14): 225 mg via ORAL
  Filled 2021-08-26 (×9): qty 3
  Filled 2021-08-26: qty 1
  Filled 2021-08-26 (×5): qty 3

## 2021-08-26 MED ORDER — LORAZEPAM 2 MG/ML IJ SOLN
2.0000 mg | Freq: Once | INTRAMUSCULAR | Status: AC
Start: 2021-08-26 — End: 2021-08-26
  Administered 2021-08-26: 2 mg via INTRAVENOUS
  Filled 2021-08-26: qty 1

## 2021-08-26 MED ORDER — ACETAMINOPHEN 650 MG RE SUPP: 650.0000 mg | Freq: Four times a day (QID) | RECTAL | Status: DC | PRN

## 2021-08-26 MED ORDER — ONDANSETRON HCL 4 MG PO TABS: 4.0000 mg | ORAL_TABLET | Freq: Four times a day (QID) | ORAL | Status: DC | PRN

## 2021-08-26 MED ORDER — LORAZEPAM 1 MG PO TABS
1.0000 mg | ORAL_TABLET | ORAL | Status: AC | PRN
  Administered 2021-08-26: 2 mg via ORAL
  Administered 2021-08-28: 1 mg via ORAL
  Administered 2021-08-29: 2 mg via ORAL
  Filled 2021-08-26 (×2): qty 2
  Filled 2021-08-26: qty 1

## 2021-08-26 MED ORDER — TRAZODONE HCL 50 MG PO TABS
150.0000 mg | ORAL_TABLET | Freq: Every day | ORAL | Status: DC
  Administered 2021-08-26 – 2021-09-09 (×13): 150 mg via ORAL
  Filled 2021-08-26 (×5): qty 3
  Filled 2021-08-26: qty 1
  Filled 2021-08-26 (×8): qty 3

## 2021-08-26 MED ORDER — PANTOPRAZOLE SODIUM 40 MG PO TBEC
40.0000 mg | DELAYED_RELEASE_TABLET | Freq: Two times a day (BID) | ORAL | Status: DC
  Administered 2021-08-26 – 2021-09-10 (×29): 40 mg via ORAL
  Filled 2021-08-26 (×30): qty 1

## 2021-08-26 MED ORDER — FOLIC ACID 1 MG PO TABS
1.0000 mg | ORAL_TABLET | Freq: Every day | ORAL | Status: DC
  Administered 2021-08-26 – 2021-09-10 (×16): 1 mg via ORAL
  Filled 2021-08-26 (×16): qty 1

## 2021-08-26 MED ORDER — POLYETHYLENE GLYCOL 3350 17 G PO PACK
17.0000 g | PACK | Freq: Every day | ORAL | Status: DC | PRN
  Administered 2021-08-29 – 2021-09-08 (×2): 17 g via ORAL
  Filled 2021-08-26 (×3): qty 1

## 2021-08-26 MED ORDER — ACETAMINOPHEN 325 MG PO TABS: 650.0000 mg | ORAL_TABLET | Freq: Four times a day (QID) | ORAL | Status: DC | PRN

## 2021-08-26 MED ORDER — HYDROXYZINE PAMOATE 25 MG PO CAPS
25.0000 mg | ORAL_CAPSULE | Freq: Four times a day (QID) | ORAL | Status: DC | PRN
  Filled 2021-08-26: qty 1

## 2021-08-26 MED ORDER — THIAMINE HCL 100 MG/ML IJ SOLN
100.0000 mg | Freq: Every day | INTRAMUSCULAR | Status: DC
  Administered 2021-08-27 – 2021-09-04 (×3): 100 mg via INTRAVENOUS
  Filled 2021-08-26 (×2): qty 2

## 2021-08-26 MED ORDER — LACTATED RINGERS IV SOLN: INTRAVENOUS | Status: AC

## 2021-08-26 MED ORDER — MIRTAZAPINE 15 MG PO TABS
30.0000 mg | ORAL_TABLET | Freq: Every day | ORAL | Status: DC
  Administered 2021-08-26 – 2021-09-09 (×14): 30 mg via ORAL
  Filled 2021-08-26 (×14): qty 2

## 2021-08-26 MED ORDER — ENOXAPARIN SODIUM 40 MG/0.4ML IJ SOSY
40.0000 mg | PREFILLED_SYRINGE | INTRAMUSCULAR | Status: DC
  Administered 2021-08-26 – 2021-09-09 (×13): 40 mg via SUBCUTANEOUS
  Filled 2021-08-26 (×15): qty 0.4

## 2021-08-26 MED ORDER — SODIUM CHLORIDE 0.9 % IV BOLUS (SEPSIS)
1000.0000 mL | Freq: Once | INTRAVENOUS | Status: AC
  Administered 2021-08-26: 1000 mL via INTRAVENOUS

## 2021-08-26 MED ORDER — SODIUM CHLORIDE 0.9 % IV SOLN: 2.0000 g | Freq: Once | INTRAVENOUS | Status: DC

## 2021-08-26 MED ORDER — SODIUM CHLORIDE 0.9 % IV BOLUS (SEPSIS)
250.0000 mL | Freq: Once | INTRAVENOUS | Status: AC
  Administered 2021-08-26: 250 mL via INTRAVENOUS

## 2021-08-26 MED ORDER — LORAZEPAM 2 MG/ML IJ SOLN
1.0000 mg | INTRAMUSCULAR | Status: AC | PRN
  Administered 2021-08-27: 2 mg via INTRAVENOUS
  Administered 2021-08-27: 1 mg via INTRAVENOUS
  Administered 2021-08-27 (×2): 2 mg via INTRAVENOUS
  Filled 2021-08-26 (×4): qty 1

## 2021-08-26 MED ORDER — ONDANSETRON HCL 4 MG/2ML IJ SOLN: 4.0000 mg | Freq: Four times a day (QID) | INTRAMUSCULAR | Status: DC | PRN

## 2021-08-26 MED ORDER — THIAMINE HCL 100 MG/ML IJ SOLN
100.0000 mg | Freq: Once | INTRAMUSCULAR | Status: AC
  Administered 2021-08-26: 100 mg via INTRAVENOUS

## 2021-08-26 NOTE — Assessment & Plan Note (Addendum)
-   cont trazodone 150 mg nightly, venlafaxine to 225 mg p.o. daily, mirtazapine 30 mg nightly ?- Hydroxyzine 25 mg every 6 hours as needed for itching, anxiety ?

## 2021-08-26 NOTE — Assessment & Plan Note (Signed)
-   Status post Ativan 2 mg, x2 ordered by EDP ?- CIWA protocol initiated, thiamine, folic acid daily ordered ?

## 2021-08-26 NOTE — ED Notes (Signed)
In room to obtain labs and place in and out cath. Pt has 2 peripheral Ivs that will not draw back. Multiple attempts made without success. Pt also refusing in and out cath. States " if I just had some fluid I could probably pee." Pt educated on need for UA, but continues to refuse. Pt also requesting wheelchair " because Im bored." Pt advised that he would not be able to travel around the dept in the wheelchair for reasons related to safety of himself and others and also for protection of pt privacy. Primary nurse, charge nurse and provider made aware of interaction.  ?

## 2021-08-26 NOTE — Assessment & Plan Note (Addendum)
Felt to be secondary to intravascular volume depletion from heavy drinking and poor p.o. intake.  Sepsis ruled out. ?

## 2021-08-26 NOTE — ED Triage Notes (Signed)
Failure to thrive, sitting in chair 1 month per patient and ems, pt uses etoh, redness to bottom, swelling to legs and unable to walk, no falls  ?
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
## 2021-08-26 NOTE — Assessment & Plan Note (Addendum)
-   PT/OT/TOC has been consulted for placement ?

## 2021-08-26 NOTE — ED Provider Notes (Signed)
? ?[facility] ?Provider Note ? ? ? Event Date/Time  ? First MD Initiated Contact with Patient 08/26/21 1710   ?  (approximate) ? ? ?History  ? ?Weakness (Failure to thrive, sitting in chair 1 month per patient and ems, pt uses etoh, redness to bottom, swelling to legs and unable to walk, no falls ) ? ? ?HPI ? ?Chad [name] is a 71 y.o. male with past medical history of prior alcohol abuse here with generalized weakness.  Patient presents after what he describes as approximately 1 month of progressive decline.  He states that he has fairly chronic lower back pain.  He states that because of this as well as his drinking, he does not move around much.  He essentially has been more or less bedbound for the last month.  He has been urinating in a urinal, defecating and throwing it to the side in a bag with his hand, and essentially immobile.  Social work came to check on him today and called EMS to bring him in.  He states he would like help.  He admits to drinking essentially is the only source of intake, and drinks a significant mount alcohol daily.  Last drink was this morning.  He does have a history of withdrawal.  Denies known history of DTs.  He states he hurts in his back which is not necessarily new for him, otherwise feels generally unwell.  No other complaints. ?  ? ? ?Physical Exam  ? ?Triage Vital Signs: ?ED Triage Vitals  ?Enc Vitals Group  ?   BP   ?   Pulse   ?   Resp   ?   Temp   ?   Temp src   ?   SpO2   ?   Weight   ?   Height   ?   Head Circumference   ?   Peak Flow   ?   Pain Score   ?   Pain Loc   ?   Pain Edu?   ?   Excl. in GC?   ? ? ?Most recent vital signs: ?Vitals:  ? 08/27/21 0043 08/27/21 0143  ?BP: 118/80 140/87  ?Pulse: (!) 109 98  ?Resp:    ?Temp:    ?SpO2:    ? ? ? ?General: Awake, disheveled, appears uncomfortable. ?CV:  Good peripheral perfusion.  ?Resp:  Normal effort.  Lungs clear. ?Abd:  No distention.  No tenderness. ?Other:  1+ pitting edema bilateral lower  extremities.  Mild erythema, particular along the buttocks and right thigh, from pressure with some superficial wounds but no deep ulcerations, fluctuance, induration. ? ? ?ED Results / Procedures / Treatments  ? ?Labs ?(all labs ordered are listed, but only abnormal results are displayed) ?Labs Reviewed  ?LACTIC ACID, PLASMA - Abnormal; Notable for the following components:  ?    Result Value  ? Lactic Acid, Venous 3.5 (*)   ? All other components within normal limits  ?LACTIC ACID, PLASMA - Abnormal; Notable for the following components:  ? Lactic Acid, Venous 2.0 (*)   ? All other components within normal limits  ?CBC WITH DIFFERENTIAL/PLATELET - Abnormal; Notable for the following components:  ? Hemoglobin 12.8 (*)   ? All other components within normal limits  ?COMPREHENSIVE METABOLIC PANEL - Abnormal; Notable for the following components:  ? Sodium 134 (*)   ? BUN <5 (*)   ? Calcium 8.0 (*)   ? Total Protein 5.7 (*)   ?  Albumin 2.3 (*)   ? AST 68 (*)   ? Alkaline Phosphatase 132 (*)   ? All other components within normal limits  ?RESP PANEL BY RT-PCR (FLU A&B, COVID) [id number]  ?CULTURE, BLOOD (ROUTINE X 2)  ?CULTURE, BLOOD (ROUTINE X 2)  ?URINE CULTURE  ?PROTIME-INR  ?APTT  ?AMMONIA  ?MAGNESIUM  ?TSH  ?BRAIN NATRIURETIC PEPTIDE  ?CK  ?ETHANOL  ?CBC WITH DIFFERENTIAL/PLATELET  ?URINALYSIS, COMPLETE (UACMP) WITH MICROSCOPIC  ?BASIC METABOLIC PANEL  ?CBC  ? ? ? ?EKG normal sinus rhythm, ventricular 98.  PR 158, QRS 95, QTc 535.  No acute ST elevations or depressions. ? ? ? ?RADIOLOGY ?Chest x-ray: Low lung volumes ? ? ?I also independently reviewed and agree with radiologist interpretations. ? ? ?PROCEDURES: ? ?Critical Care performed: No ? ? ?MEDICATIONS ORDERED IN ED: ?Medications  ?lactated ringers infusion ( Intravenous New Bag/Given 08/26/21 1806)  ?LORazepam (ATIVAN) 2 MG/ML injection (  Not Given 08/26/21 1803)  ?mirtazapine (REMERON) tablet 30 mg (30 mg Oral Given 08/26/21 2231)  ?nicotine (NICODERM CQ - dosed in  mg/24 hours) patch 21 mg (21 mg Transdermal Patch Applied 08/26/21 2235)  ?traZODone (DESYREL) tablet 150 mg (150 mg Oral Given 08/26/21 2232)  ?venlafaxine XR (EFFEXOR-XR) 24 hr capsule 225 mg (has no administration in time range)  ?pantoprazole (PROTONIX) EC tablet 40 mg (40 mg Oral Given 08/26/21 2230)  ?sucralfate (CARAFATE) 1 GM/10ML suspension 1 g (1 g Oral Given 08/27/21 0034)  ?acetaminophen (TYLENOL) tablet 650 mg (has no administration in time range)  ?  Or  ?acetaminophen (TYLENOL) suppository 650 mg (has no administration in time range)  ?ondansetron (ZOFRAN) tablet 4 mg (has no administration in time range)  ?  Or  ?ondansetron (ZOFRAN) injection 4 mg (has no administration in time range)  ?enoxaparin (LOVENOX) injection 40 mg (40 mg Subcutaneous Given 08/26/21 2218)  ?polyethylene glycol (MIRALAX / GLYCOLAX) packet 17 g (has no administration in time range)  ?LORazepam (ATIVAN) tablet 1-4 mg ( Oral See Alternative 08/27/21 0149)  ?  Or  ?LORazepam (ATIVAN) injection 1-4 mg (2 mg Intravenous Given 08/27/21 0149)  ?thiamine tablet 100 mg (has no administration in time range)  ?  Or  ?thiamine (B-1) injection 100 mg (has no administration in time range)  ?folic acid (FOLVITE) tablet 1 mg (1 mg Oral Given 08/26/21 2229)  ?multivitamin with minerals tablet 1 tablet (1 tablet Oral Given 08/26/21 2229)  ?metoprolol succinate (TOPROL-XL) 24 hr tablet 25 mg (25 mg Oral Given 08/26/21 2231)  ?feeding supplement (ENSURE ENLIVE / ENSURE PLUS) liquid 237 mL (237 mLs Oral Patient Refused/Not Given 08/26/21 2303)  ?hydrOXYzine (ATARAX) tablet 25 mg (has no administration in time range)  ?sodium chloride 0.9 % bolus 1,000 mL (0 mLs Intravenous Stopped 08/26/21 1856)  ?  And  ?sodium chloride 0.9 % bolus 1,000 mL (0 mLs Intravenous Stopped 08/26/21 1958)  ?  And  ?sodium chloride 0.9 % bolus 250 mL (0 mLs Intravenous Stopped 08/26/21 1806)  ?LORazepam (ATIVAN) injection 2 mg (2 mg Intravenous Given 08/26/21 1803)  ?thiamine (B-1) injection 100 mg  (100 mg Intravenous Given 08/26/21 1804)  ?LORazepam (ATIVAN) injection 2 mg (2 mg Intravenous Given 08/26/21 2113)  ? ? ? ?IMPRESSION / MDM / ASSESSMENT AND PLAN / ED COURSE  ?I reviewed the triage vital signs and the nursing notes. ?             ?               ? ? ?  The patient is on the cardiac monitor to evaluate for evidence of arrhythmia and/or significant heart rate changes. ? ? ?Ddx:  ?Differential includes the following, with pertinent life- or limb-threatening emergencies considered: ? ?Failure to thrive with concern for possible metabolic derangement, AKI, rhabdomyolysis, alcohol withdrawal, occult infection, hyperammonemia and cirrhosis related to his alcohol use ? ? ?MDM:  ?71 yo M with h/o Etoh dependence here with failure to thrive, generalized weakness. Pt reportedly has been more or less in the same chair x several weeks. Suspect likely FTT in setting of EtOH dependence. No signs to suggest infection at this time. Labs reviewed, notable for lactic acidosis of 3.5 which I suspect is 2/2 his dehydration, etoh abuse, likely liver disease. No other infectious sx. CMP with AST elevation c/w etoh use. Hypoalbuminemia from poor nutrition noted. WBC normal. CK normal.. Pt notably tremulous in ED with CIWA 13 after 2 mg IV ativan.  ? ?Will admit for EtOH w/d, deconditioning, lactic acidosis likely 2/2 starvation and etoh use.  ? ? ?MEDICATIONS GIVEN IN ED: ?Medications  ?lactated ringers infusion ( Intravenous New Bag/Given 08/26/21 1806)  ?LORazepam (ATIVAN) 2 MG/ML injection (  Not Given 08/26/21 1803)  ?mirtazapine (REMERON) tablet 30 mg (30 mg Oral Given 08/26/21 2231)  ?nicotine (NICODERM CQ - dosed in mg/24 hours) patch 21 mg (21 mg Transdermal Patch Applied 08/26/21 2235)  ?traZODone (DESYREL) tablet 150 mg (150 mg Oral Given 08/26/21 2232)  ?venlafaxine XR (EFFEXOR-XR) 24 hr capsule 225 mg (has no administration in time range)  ?pantoprazole (PROTONIX) EC tablet 40 mg (40 mg Oral Given 08/26/21 2230)  ?sucralfate  (CARAFATE) 1 GM/10ML suspension 1 g (1 g Oral Given 08/27/21 0034)  ?acetaminophen (TYLENOL) tablet 650 mg (has no administration in time range)  ?  Or  ?acetaminophen (TYLENOL) suppository 650 mg (has n

## 2021-08-26 NOTE — Assessment & Plan Note (Addendum)
Sacral ulcer, ruled out ?--contact dermatitis from exposure from his stool and urine ?--wound care consulted ?--barrier cream ?

## 2021-08-26 NOTE — Assessment & Plan Note (Addendum)
-  urine cx with multiple species present, suggesting chronic colonization.  ?--did not start on abx ?

## 2021-08-26 NOTE — ED Notes (Signed)
This RN attempted to collect labs with no success ?

## 2021-08-26 NOTE — Hospital Course (Addendum)
[name] is a 71 year old male with history of alcohol abuse, hypertension, depression, anxiety, history of peptic ulcer disease, nicotine dependence, GERD, hepatic steatosis, diverticulosis, who presented to the emergency department on 5/8 with concerns of failure to thrive, sitting in a chair for 1 month, swelling in his lower extremities.  Patient with long history of alcohol abuse and unable to care for himself.  He at times has been to skilled nursing facilities, but then checks himself out and goes back to resuming drinking.Patient had not been taking his home medications.  Over next few days after receiving medications for withdrawals and stabilization and hydrated, plan was for patient to go to skilled nursing again.  Patient's daughter reached out and request that patient go to a skilled nursing facility in order close to [facility], closer to her so that she can aid in his care and prevent recurring cycle of facility transfer and then leaving AMA.  Patient currently approved for skilled nursing bed in [place] city and waiting for [place].

## 2021-08-26 NOTE — Assessment & Plan Note (Addendum)
-  Patient has not been eating or taking his medications for at least 1 month PTA ?- cont Metoprolol succinate 25 mg daily  ? ?

## 2021-08-27 DIAGNOSIS — K219 Gastro-esophageal reflux disease without esophagitis: Secondary | ICD-10-CM | POA: Diagnosis present

## 2021-08-27 DIAGNOSIS — Z8711 Personal history of peptic ulcer disease: Secondary | ICD-10-CM | POA: Diagnosis not present

## 2021-08-27 DIAGNOSIS — Z20822 Contact with and (suspected) exposure to covid-19: Secondary | ICD-10-CM | POA: Diagnosis present

## 2021-08-27 DIAGNOSIS — E872 Acidosis, unspecified: Secondary | ICD-10-CM | POA: Diagnosis present

## 2021-08-27 DIAGNOSIS — R54 Age-related physical debility: Secondary | ICD-10-CM | POA: Diagnosis present

## 2021-08-27 DIAGNOSIS — E43 Unspecified severe protein-calorie malnutrition: Secondary | ICD-10-CM | POA: Diagnosis present

## 2021-08-27 DIAGNOSIS — E8809 Other disorders of plasma-protein metabolism, not elsewhere classified: Secondary | ICD-10-CM | POA: Diagnosis present

## 2021-08-27 DIAGNOSIS — K2210 Ulcer of esophagus without bleeding: Secondary | ICD-10-CM | POA: Diagnosis present

## 2021-08-27 DIAGNOSIS — F332 Major depressive disorder, recurrent severe without psychotic features: Secondary | ICD-10-CM | POA: Diagnosis present

## 2021-08-27 DIAGNOSIS — F17210 Nicotine dependence, cigarettes, uncomplicated: Secondary | ICD-10-CM | POA: Diagnosis present

## 2021-08-27 DIAGNOSIS — Z6822 Body mass index (BMI) 22.0-22.9, adult: Secondary | ICD-10-CM | POA: Diagnosis not present

## 2021-08-27 DIAGNOSIS — R4182 Altered mental status, unspecified: Secondary | ICD-10-CM | POA: Diagnosis present

## 2021-08-27 DIAGNOSIS — Z79899 Other long term (current) drug therapy: Secondary | ICD-10-CM | POA: Diagnosis not present

## 2021-08-27 DIAGNOSIS — I10 Essential (primary) hypertension: Secondary | ICD-10-CM | POA: Diagnosis present

## 2021-08-27 DIAGNOSIS — L24A9 Irritant contact dermatitis due friction or contact with other specified body fluids: Secondary | ICD-10-CM | POA: Diagnosis present

## 2021-08-27 DIAGNOSIS — R627 Adult failure to thrive: Secondary | ICD-10-CM | POA: Diagnosis present

## 2021-08-27 DIAGNOSIS — R64 Cachexia: Secondary | ICD-10-CM | POA: Diagnosis present

## 2021-08-27 DIAGNOSIS — E860 Dehydration: Secondary | ICD-10-CM | POA: Diagnosis present

## 2021-08-27 DIAGNOSIS — G8929 Other chronic pain: Secondary | ICD-10-CM | POA: Diagnosis present

## 2021-08-27 DIAGNOSIS — K760 Fatty (change of) liver, not elsewhere classified: Secondary | ICD-10-CM | POA: Diagnosis present

## 2021-08-27 DIAGNOSIS — R7989 Other specified abnormal findings of blood chemistry: Secondary | ICD-10-CM | POA: Diagnosis present

## 2021-08-27 DIAGNOSIS — R300 Dysuria: Secondary | ICD-10-CM | POA: Diagnosis present

## 2021-08-27 DIAGNOSIS — F419 Anxiety disorder, unspecified: Secondary | ICD-10-CM | POA: Diagnosis present

## 2021-08-27 DIAGNOSIS — R32 Unspecified urinary incontinence: Secondary | ICD-10-CM | POA: Diagnosis present

## 2021-08-27 DIAGNOSIS — F10230 Alcohol dependence with withdrawal, uncomplicated: Secondary | ICD-10-CM | POA: Diagnosis present

## 2021-08-27 LAB — CBC
HCT: 39.2 % (ref 39.0–52.0)
Hemoglobin: 12.5 g/dL — ABNORMAL LOW (ref 13.0–17.0)
MCH: 29.8 pg (ref 26.0–34.0)
MCHC: 31.9 g/dL (ref 30.0–36.0)
MCV: 93.6 fL (ref 80.0–100.0)
Platelets: 200 10*3/uL (ref 150–400)
RBC: 4.19 MIL/uL — ABNORMAL LOW (ref 4.22–5.81)
RDW: 14.4 % (ref 11.5–15.5)
WBC: 7.2 10*3/uL (ref 4.0–10.5)
nRBC: 0 % (ref 0.0–0.2)

## 2021-08-27 LAB — URINALYSIS, COMPLETE (UACMP) WITH MICROSCOPIC
Bacteria, UA: NONE SEEN
Bilirubin Urine: NEGATIVE
Glucose, UA: NEGATIVE mg/dL
Hgb urine dipstick: NEGATIVE
Ketones, ur: NEGATIVE mg/dL
Leukocytes,Ua: NEGATIVE
Nitrite: NEGATIVE
Protein, ur: NEGATIVE mg/dL
Specific Gravity, Urine: 1.012 (ref 1.005–1.030)
pH: 7 (ref 5.0–8.0)

## 2021-08-27 LAB — BASIC METABOLIC PANEL
Anion gap: 7 (ref 5–15)
BUN: <5 mg/dL — ABNORMAL LOW (ref 8–23)
CO2: 23 mmol/L (ref 22–32)
Calcium: 8.1 mg/dL — ABNORMAL LOW (ref 8.9–10.3)
Chloride: 105 mmol/L (ref 98–111)
Creatinine, Ser: 0.73 mg/dL (ref 0.61–1.24)
GFR, Estimated: >60 mL/min (ref >60)
Glucose, Bld: 116 mg/dL — ABNORMAL HIGH (ref 70–99)
Potassium: 4.4 mmol/L (ref 3.5–5.1)
Sodium: 135 mmol/L (ref 135–145)

## 2021-08-27 LAB — ETHANOL: Alcohol, Ethyl (B): <10 mg/dL (ref <10)

## 2021-08-27 LAB — LACTIC ACID, PLASMA: Lactic Acid, Venous: 2 mmol/L (ref 0.5–1.9)

## 2021-08-27 MED ORDER — ORAL CARE MOUTH RINSE
15.0000 mL | Freq: Two times a day (BID) | OROMUCOSAL | Status: DC
  Administered 2021-08-27 – 2021-09-10 (×21): 15 mL via OROMUCOSAL

## 2021-08-27 MED ORDER — GERHARDT'S BUTT CREAM
TOPICAL_CREAM | Freq: Four times a day (QID) | CUTANEOUS | Status: AC
Start: 2021-08-27 — End: 2021-09-01
  Filled 2021-08-27 (×2): qty 1

## 2021-08-27 NOTE — Progress Notes (Signed)
PHARMACY - PHYSICIAN COMMUNICATION ?CRITICAL VALUE ALERT - BLOOD CULTURE IDENTIFICATION (BCID) ? ?Chad [name] is an 71 y.o. male who presented to [facility] on 08/26/2021 with a chief complaint of alcohol withdrawal / failure to thrive ? ?Assessment:  1/4 bottles GPR. Suspect contaminant. ? ?Name of physician (or Provider) Contacted: Neomia Glass ? ?Current antibiotics: None ? ?Changes to prescribed antibiotics recommended:  ?Do not recommend initiation of antibiotics based on this result. Continue to monitor off antibiotics ? ?Benita Gutter ?08/27/2021  8:20 PM ? ?

## 2021-08-27 NOTE — Evaluation (Signed)
Occupational Therapy Evaluation ?Patient Details ?Name: Chad [name] ?MRN: [id number] ?DOB: [birth year] ?Today's Date: 08/27/2021 ? ? ?History of Present Illness 71 year old male with history of alcohol abuse, hypertension, depression, anxiety, history of peptic ulcer disease, nicotine dependence, GERD, hepatic steatosis, diverticulosis, who presents to the emergency department for chief concerns of failure to thrive, sitting in a chair for 1 month, swelling in his lower extremities  ? ?Clinical Impression ?  ?Patient presenting with decreased Ind in self care, balance, functional mobility/transfers,endurance, and safety awareness. Per chart review, pt lives at home alone but does not go into community much. He has neighbors and family bring him the things he needs. Patient keeps eyes closed during assessment. He does follow some commands for strength testing. Pt does not verbally answer any questions and total A for bed mobility to move pt towards EOB with pt moaning in discomfort.  Patient will benefit from acute OT to increase overall independence in the areas of ADLs, functional mobility, and safety awareness in order to safely discharge to next venue of care.  ?   ? ?Recommendations for follow up therapy are one component of a multi-disciplinary discharge planning process, led by the attending physician.  Recommendations may be updated based on patient status, additional functional criteria and insurance authorization.  ? ?Follow Up Recommendations ? Skilled nursing-short term rehab (<3 hours/day)  ?  ?Assistance Recommended at Discharge Frequent or constant Supervision/Assistance  ?Patient can return home with the following A lot of help with walking and/or transfers;A lot of help with bathing/dressing/bathroom;Assistance with cooking/housework;Help with stairs or ramp for entrance;Assist for transportation ? ?  ?Functional Status Assessment ? Patient has had a recent decline in their functional status and  demonstrates the ability to make significant improvements in function in a reasonable and predictable amount of time.  ?Equipment Recommendations ? Other (comment) (defer to next venue of care)  ?  ?   ?Precautions / Restrictions Precautions ?Precautions: Fall  ? ?  ? ?Mobility Bed Mobility ?Overal bed mobility: Needs Assistance ?  ?  ?  ?  ?  ?  ?General bed mobility comments: total A- pt gives no effort for mobility ?  ? ?Transfers ?  ?  ?  ?  ?  ?  ?  ?  ?  ?General transfer comment: No performed secondary to safety concerns and pt keeping eyes closed ?  ? ?  ?   ? ?ADL either performed or assessed with clinical judgement  ? ?ADL   ?  ?  ?  ?  ?  ?  ?  ?  ?  ?  ?  ?  ?  ?  ?  ?  ?  ?  ?  ?General ADL Comments: max- total A this session  ? ? ? ?Vision Patient Visual Report: No change from baseline ?   ?   ?   ?   ? ?Pertinent Vitals/Pain Pain Assessment ?Pain Assessment: Faces ?Faces Pain Scale: Hurts little more ?Pain Location: generalized ?Pain Descriptors / Indicators: Discomfort ?Pain Intervention(s): Monitored during session, Repositioned  ? ? ? ?Hand Dominance Right ?  ?Extremity/Trunk Assessment Upper Extremity Assessment ?Upper Extremity Assessment: Generalized weakness ?  ?Lower Extremity Assessment ?Lower Extremity Assessment: Generalized weakness ?  ?  ?  ?Communication Communication ?Communication: No difficulties ?  ?Cognition Arousal/Alertness: Lethargic ?Behavior During Therapy: Flat affect ?Overall Cognitive Status: No family/caregiver present to determine baseline cognitive functioning ?  ?  ?  ?  ?  ?  ?  ?  ?  ?  ?  ?  ?  ?  ?  ?  ?  General Comments: Pt keeps eyes closed during session. He follows some commands for testing but does not verbally speak to answer questions. ?  ?  ?   ?   ?   ? ? ?Home Living Family/patient expects to be discharged to:: Private residence ?Living Arrangements: Alone ?Available Help at Discharge: Neighbor;Family;Available PRN/intermittently ?Type of Home:  Apartment ?Home Access: Stairs to enter ?Entrance Stairs-Number of Steps: 13 ?Entrance Stairs-Rails: Right;Left;Can reach both ?Home Layout: One level ?  ?  ?Bathroom Shower/Tub: Tub/shower unit ?  ?  ?  ?  ?Home Equipment: Conservation officer, nature (2 wheels);Cane - single point ?  ?Additional Comments: home information obtained via chart review and prior admission as pt is unable to provide and no family present on evaluation. ?  ? ?  ?Prior Functioning/Environment Prior Level of Function : History of Falls (last six months) ?  ?  ?  ?  ?  ?  ?Mobility Comments: household AMB with frequent falls. Family and neighbors bringing him groceries/alcohol ?ADLs Comments: Ind ?  ? ?  ?  ?OT Problem List: Decreased strength;Decreased activity tolerance;Impaired balance (sitting and/or standing);Decreased safety awareness;Decreased knowledge of precautions ?  ?   ?OT Treatment/Interventions: Self-care/ADL training;Therapeutic exercise;Therapeutic activities;Energy conservation;DME and/or AE instruction;Manual therapy;Balance training;Patient/family education;Cognitive remediation/compensation  ?  ?OT Goals(Current goals can be found in the care plan section) Acute Rehab OT Goals ?Patient Stated Goal: none stated ?OT Goal Formulation: Patient unable to participate in goal setting ?Time For Goal Achievement: 09/10/21 ?Potential to Achieve Goals: Fair ?ADL Goals ?Pt Will Perform Grooming: with supervision;standing ?Pt Will Perform Lower Body Dressing: with supervision;sit to/from stand ?Pt Will Transfer to Toilet: with supervision;ambulating ?Pt Will Perform Toileting - Clothing Manipulation and hygiene: with supervision;sit to/from stand  ?OT Frequency: Min 2X/week ?  ? ?   ?AM-PAC OT "6 Clicks" Daily Activity     ?Outcome Measure Help from another person eating meals?: A Lot ?Help from another person taking care of personal grooming?: A Lot ?Help from another person toileting, which includes using toliet, bedpan, or urinal?: Total ?Help  from another person bathing (including washing, rinsing, drying)?: A Lot ?Help from another person to put on and taking off regular upper body clothing?: A Lot ?Help from another person to put on and taking off regular lower body clothing?: Total ?6 Click Score: 10 ?  ?End of Session Nurse Communication: Mobility status ? ?Activity Tolerance: Patient limited by lethargy ?Patient left: in bed;with call bell/phone within reach ? ?OT Visit Diagnosis: Unsteadiness on feet (R26.81);Muscle weakness (generalized) (M62.81);History of falling (Z91.81)  ?              ?Time: [id number] ?OT Time Calculation (min): 13 min ?Charges:  OT General Charges ?$OT Visit: 1 Visit ?OT Evaluation ?$OT Eval Moderate Complexity: 1 Mod ? ?Darleen Crocker, MS, OTR/L , CBIS ?ascom [phone]  ?08/27/21, 1:43 PM  ?

## 2021-08-27 NOTE — Evaluation (Signed)
Physical Therapy Evaluation Patient Details Name: Chad [name] MRN: [id number] DOB: 1950/10/16 Today's Date: 08/27/2021  History of Present Illness  71 year old male with history of alcohol abuse, hypertension, depression, anxiety, history of peptic ulcer disease, nicotine dependence, GERD, hepatic steatosis, diverticulosis, who presents to the emergency department/admitted for chief concerns of failure to thrive, sitting in a chair for 1 month, swelling in his lower extremities  Clinical Impression  Patient resting in bed upon arrival to room; verbally responds to therapist, but does not open eyes throughout session.  Follows some simple commands, but often requires extensive encouragement/cuing from therapist to complete. Endorses pain in back with movement attempts (FACES 4/10); improved with return to supine and repositioning. Patient globally weak and deconditioned throughout all extremities.  Lacking full knee extension bilaterally; demonstrating significant stiffness throughout trunk/hips (poor dissociation with functional movement patterns).  Currently requiring mod assist for bed mobility; cga/min assist for sitting balance with R UE support, mod assist for sitting balance with no UE support.  Standing, OOB, gait efforts deferred secondary to poor sitting balance, poor activity tolerance; do anticipate need for +2 to safely attempt/complete. Will continue to assess/progress as appropriate in subsequent sessions. Would benefit from skilled PT to address above deficits and promote optimal return to PLOF.; recommend transition to STR upon discharge from acute hospitalization.  If remains unable to manage care in home environment post-rehab, may consider transition to LTC for more permanent solution to care needs.      Recommendations for follow up therapy are one component of a multi-disciplinary discharge planning process, led by the attending physician.  Recommendations may be updated based on  patient status, additional functional criteria and insurance authorization.  Follow Up Recommendations Skilled nursing-short term rehab (<3 hours/day) (if unable manage care in home post-rehab, may consider transition to LTC)    Assistance Recommended at Discharge Frequent or constant Supervision/Assistance  Patient can return home with the following  A lot of help with walking and/or transfers;A lot of help with bathing/dressing/bathroom    Equipment Recommendations    Recommendations for Other Services       Functional Status Assessment Patient has had a recent decline in their functional status and demonstrates the ability to make significant improvements in function in a reasonable and predictable amount of time.     Precautions / Restrictions Precautions Precautions: Fall Restrictions Weight Bearing Restrictions: No      Mobility  Bed Mobility Overal bed mobility: Needs Assistance Bed Mobility: Supine to Sit, Sit to Supine     Supine to sit: Mod assist, Max assist Sit to supine: Max assist   General bed mobility comments: generally stiff throughout back/hips; limited dissociation of LEs from trunk with mobility tasks.  cga/close sup for sitting balance edge of bed, does require R UE support at all times; level of assist increases to mod assist when UE support removed    Transfers                   General transfer comment: Deferred secondary to poor sitting balance, poor activity tolerance; do anticipate need for +2 to safely attempt/complete    Ambulation/Gait               General Gait Details: Deferred secondary to poor sitting balance, poor activity tolerance; do anticipate need for +2 to safely attempt/complete  Stairs            Wheelchair Mobility    Modified Rankin (Stroke Patients Only)  Balance Overall balance assessment: Needs assistance Sitting-balance support: No upper extremity supported, Feet supported Sitting  balance-Leahy Scale: Poor Sitting balance - Comments: cga with R UE support; mod assist with no UE support Postural control: Right lateral lean                                   Pertinent Vitals/Pain Pain Assessment Pain Assessment: Faces Faces Pain Scale: Hurts little more Pain Location: back Pain Descriptors / Indicators: Guarding, Grimacing, Moaning Pain Intervention(s): Limited activity within patient's tolerance, Monitored during session, Repositioned    Home Living Family/patient expects to be discharged to:: Private residence Living Arrangements: Alone Available Help at Discharge: Neighbor;Family;Available PRN/intermittently Type of Home: Apartment Home Access: Stairs to enter Entrance Stairs-Rails: Right;Left;Can reach both Entrance Stairs-Number of Steps: 13   Home Layout: One level Home Equipment: Agricultural consultant (2 wheels);Cane - single point Additional Comments: home information obtained via chart review and prior admission as pt is unable to provide and no family present on evaluation.    Prior Function Prior Level of Function : History of Falls (last six months)             Mobility Comments: At baseline, household AMB with frequent falls, though endorses recently "sitting in recliner" with limited mobility efforts. Family and neighbors bringing him groceries/alcohol       Hand Dominance   Dominant Hand: Right    Extremity/Trunk Assessment   Upper Extremity Assessment Upper Extremity Assessment: Generalized weakness (grossly 3-/5 throughout)    Lower Extremity Assessment Lower Extremity Assessment: Generalized weakness (grossly 3-/5 throughout; bilat knee ext lacking 15-20 degrees)       Communication   Communication:  (generally mumbled, limited speech quality/intelligibility)  Cognition Arousal/Alertness: Lethargic Behavior During Therapy: Flat affect Overall Cognitive Status: No family/caregiver present to determine baseline  cognitive functioning                                 General Comments: Pt keeps eyes closed during session. He follows some commands but requires constant encouragement        General Comments      Exercises     Assessment/Plan    PT Assessment Patient needs continued PT services  PT Problem List Decreased strength;Decreased range of motion;Decreased activity tolerance;Decreased balance;Decreased mobility;Decreased coordination;Decreased cognition;Decreased knowledge of use of DME;Decreased safety awareness;Decreased knowledge of precautions;Pain       PT Treatment Interventions DME instruction;Gait training;Functional mobility training;Therapeutic activities;Therapeutic exercise;Balance training;Patient/family education;Cognitive remediation    PT Goals (Current goals can be found in the Care Plan section)  Acute Rehab PT Goals Patient Stated Goal: to get a hot meal PT Goal Formulation: With patient Time For Goal Achievement: 09/10/21 Potential to Achieve Goals: Fair    Frequency Min 2X/week     Co-evaluation               AM-PAC PT "6 Clicks" Mobility  Outcome Measure Help needed turning from your back to your side while in a flat bed without using bedrails?: A Lot Help needed moving from lying on your back to sitting on the side of a flat bed without using bedrails?: A Lot Help needed moving to and from a bed to a chair (including a wheelchair)?: Total Help needed standing up from a chair using your arms (e.g., wheelchair or bedside chair)?: Total Help needed to  walk in hospital room?: Total Help needed climbing 3-5 steps with a railing? : Total 6 Click Score: 8    End of Session   Activity Tolerance: Patient limited by pain;Patient limited by fatigue Patient left: in bed;with bed alarm set;with call bell/phone within reach Nurse Communication: Mobility status PT Visit Diagnosis: Muscle weakness (generalized) (M62.81);Difficulty in walking,  not elsewhere classified (R26.2);Pain    Time: 1451-1515 PT Time Calculation (min) (ACUTE ONLY): 24 min   Charges:   PT Evaluation $PT Eval Moderate Complexity: 1 Mod          Isami Mehra H. Manson Passey, PT, DPT, NCS 08/27/21, 5:41 PM [phone]

## 2021-08-27 NOTE — Consult Note (Signed)
WOC Nurse Consult Note: ?Reason for Consult:Irritant contact dermatitis due to fecal and urinary incontinence. No pressure injuries. ?Wound type: irritant contact dermatitis ? ?ICD-10 CM Codes for Irritant Dermatitis ? ?[id number] - Due to fecal, urinary or dual incontinence ? ?Pressure Injury POA: N/A ?Measurement:Diffuse erythema with scattered partial thickness areas of skin loss over bilateral, buttocks. Maceration in the perineal and genital areas due to prolonged exposure to moisture ?Wound [id number] ?Drainage (amount, consistency, odor) scant serous ?Periwound:erythematous, macerated ?Dressing procedure/placement/frequency: A moisture barrier compounded preparation (Gerhart's Butt Cream, a 1:1:1 preparation of zinc oxide, hydrocortisone cream and lotrimin cream) is to be applied 4 times daily. Turning and repositioning, floatation of heels and placement of a sacral foam dressing is ordered for PI prevention.   ?Recommend management of urinary incontinence via an external male urinary incontinence management system and suction (PurWick Male). If you agree, please order. ? ?[place] nursing team will not follow, but will remain available to this patient, the nursing and medical teams.  Please re-consult if needed. ?Thanks, ?Maudie Flakes, MSN, RN, [place], [place], CWON-AP, [place]  ?Pager# [phone]  ? ? ? ?  ?

## 2021-08-27 NOTE — Progress Notes (Signed)
?PROGRESS NOTE ? ? ? ?Chad [name]  [id number] DOB: Oct 24, 1950 DOA: 08/26/2021 ?PCP: Sharyne Peach, MD  ? ? ?Brief Narrative:  ?70 year old male with history of alcohol abuse, hypertension, depression, anxiety, history of peptic ulcer disease, nicotine dependence, GERD, hepatic steatosis, diverticulosis, who presents to the emergency department for chief concerns of failure to thrive, sitting in a chair for 1 month, swelling in his lower extremities. ?  ?Initial vitals in the emergency department showed temperature of 98.9, respiration rate of 17, heart rate of 96, blood pressure 127/82, SPO2 of 98% on room air. ? ? ?Assessment & Plan: ?  ?Principal Problem: ?  Failure to thrive in adult ?Active Problems: ?  Alcohol dependence with uncomplicated withdrawal ([place]) ?  Anxiety and depression ?  Erosive esophagitis ?  Major depressive disorder, recurrent severe without psychotic features ([place]) ?  Alcohol withdrawal ([place]) ?  Elevated lactic acid level ?  Essential hypertension ?  Dysuria ?  Pressure ulcer of sacral region ?  Adult failure to thrive ? ?Alcohol withdrawal ([place]) ?- Status post Ativan 2 mg, x2 ordered by EDP ?- Continue CIWA protocol, thiamine, folic acid, MVI ?--Unclear quantity of alcohol consumption.  Patient tremulous and high risk for severe withdrawal ? ?Failure to thrive in adult ?- PT/OT/TOC has been consulted for placement ?  ?Anxiety and depression ?- Resume trazodone 150 mg nightly, venlafaxine to 25 mg p.o. daily before breakfast, mirtazapine 30 mg nightly ?- Hydroxyzine 25 mg every 6 hours as needed for itching, anxiety ?  ?Pressure ulcer of sacral region ?- Bilateral cervical region ?- Wound nurse has been consulted ?  ?Dysuria ?- Urinalysis inconsistent with infection.  No antibiotics initiated ?  ?Essential hypertension ?- Metoprolol succinate 25 mg daily resumed ?-Patient has not been eating or taking his medications for at least 1 month ?  ?Elevated lactic acid level ?- Unclear  etiology.  No signs of hypoperfusion.  Possibly due to chronic liver dysfunction ?  ?DVT prophylaxis: Subcu heparin ?Code Status: Partial.  No intubation, okay with compressions.  Once his mental status improves this needs to be revisited as this is not a viable option ?Family Communication: None today.  Attempted to call friend Vella Redhead [phone] on 5/9.  No answer.  Voicemail not left. ?Disposition Plan: Status is: Inpatient ?Remains inpatient appropriate because: Adult failure to thrive.  Acute alcohol withdrawal. ? ? ?Level of care: Telemetry Medical ? ?Consultants:  ?None ? ?Procedures:  ?None ? ?Antimicrobials: ?None ? ? ?Subjective: ?Seen and examined.  Appears very weak frail and deconditioned.  Very soft voice.  Does answer questions but drifts off to sleep quickly.  Poor participate in interview. ? ?Objective: ?Vitals:  ? 08/27/21 0601 08/27/21 0803 08/27/21 0840 08/27/21 1240  ?BP:  128/78 125/77   ?Pulse: 85 99 80   ?Resp:  17 18   ?Temp:  98.6 ?F (37 ?C) 98 ?F (36.7 ?C) 98.4 ?F (36.9 ?C)  ?TempSrc:  Axillary Axillary   ?SpO2:  99% 100%   ?Weight:      ?Height:      ? ? ?Intake/Output Summary (Last 24 hours) at 08/27/2021 1243 ?Last data filed at 08/26/2021 1856 ?Gross per 24 hour  ?Intake 1250 ml  ?Output --  ?Net 1250 ml  ? ?Filed Weights  ? 08/26/21 1720  ?Weight: 71 kg  ? ? ?Examination: ? ?General exam: Appears weak frail and deconditioned.  Appears older than stated age ?Respiratory system: \Bibasilar crackles.  Normal work of breathing.  Room  air ?Cardiovascular system: Tachycardic, regular rhythm, no murmurs ?Gastrointestinal system: Soft, NT/ND, normal bowel sounds ?Central nervous system: Lethargic.  Oriented x1.  No focal deficits ?Extremities: Diffuse muscle wasting.  Decreased power bilaterally ?Skin: No rashes, lesions or ulcers ?Psychiatry: Judgement and insight appear impaired. Mood & affect flattened.  ? ? ? ?Data Reviewed: I have personally reviewed following labs and imaging  studies ? ?CBC: ?Recent Labs  ?Lab 08/26/21 ?1725 08/27/21 ?0723  ?WBC 5.9 7.2  ?NEUTROABS 3.3  --   ?HGB 12.8* 12.5*  ?HCT 40.3 39.2  ?MCV 93.7 93.6  ?PLT 228 200  ? ?Basic Metabolic Panel: ?Recent Labs  ?Lab 08/26/21 ?1725 08/27/21 ?0723  ?NA 134* 135  ?K 5.0 4.4  ?CL 100 105  ?CO2 25 23  ?GLUCOSE 99 116*  ?BUN <5* <5*  ?CREATININE 0.80 0.73  ?CALCIUM 8.0* 8.1*  ?MG 2.1  --   ? ?GFR: ?Estimated Creatinine Clearance: 88.8 mL/min (by C-G formula based on SCr of 0.73 mg/dL). ?Liver Function Tests: ?Recent Labs  ?Lab 08/26/21 ?1725  ?AST 68*  ?ALT 24  ?ALKPHOS 132*  ?BILITOT 1.1  ?PROT 5.7*  ?ALBUMIN 2.3*  ? ?No results for input(s): LIPASE, AMYLASE in the last 168 hours. ?Recent Labs  ?Lab 08/26/21 ?1724  ?AMMONIA 20  ? ?Coagulation Profile: ?Recent Labs  ?Lab 08/26/21 ?1724  ?INR 1.2  ? ?Cardiac Enzymes: ?Recent Labs  ?Lab 08/26/21 ?1725  ?CKTOTAL 58  ? ?BNP (last 3 results) ?No results for input(s): PROBNP in the last 8760 hours. ?HbA1C: ?No results for input(s): HGBA1C in the last 72 hours. ?CBG: ?No results for input(s): GLUCAP in the last 168 hours. ?Lipid Profile: ?No results for input(s): CHOL, HDL, LDLCALC, TRIG, CHOLHDL, LDLDIRECT in the last 72 hours. ?Thyroid Function Tests: ?Recent Labs  ?  08/26/21 ?1725  ?TSH 1.852  ? ?Anemia Panel: ?No results for input(s): VITAMINB12, FOLATE, FERRITIN, TIBC, IRON, RETICCTPCT in the last 72 hours. ?Sepsis Labs: ?Recent Labs  ?Lab 08/26/21 ?1724 08/26/21 ?2345  ?LATICACIDVEN 3.5* 2.0*  ? ? ?Recent Results (from the past 240 hour(s))  ?Resp Panel by RT-PCR (Flu A&B, Covid) Nasopharyngeal Swab     Status: None  ? Collection Time: 08/26/21  5:24 PM  ? Specimen: Nasopharyngeal Swab; Nasopharyngeal(NP) swabs in vial transport medium  ?Result Value Ref Range Status  ? SARS Coronavirus 2 by RT PCR NEGATIVE NEGATIVE Final  ?  Comment: (NOTE) ?SARS-CoV-2 target nucleic acids are NOT DETECTED. ? ?The SARS-CoV-2 RNA is generally detectable in upper respiratory ?specimens during  the acute phase of infection. The lowest ?concentration of SARS-CoV-2 viral copies this assay can detect is ?138 copies/mL. A negative result does not preclude SARS-Cov-2 ?infection and should not be used as the sole basis for treatment or ?other patient management decisions. A negative result may occur with  ?improper specimen collection/handling, submission of specimen other ?than nasopharyngeal swab, presence of viral mutation(s) within the ?areas targeted by this assay, and inadequate number of viral ?copies(<138 copies/mL). A negative result must be combined with ?clinical observations, patient history, and epidemiological ?information. The expected result is Negative. ? ?Fact Sheet for Patients:  ?EntrepreneurPulse.com.au ? ?Fact Sheet for Healthcare Providers:  ?IncredibleEmployment.be ? ?This test is no t yet approved or cleared by the [place] FDA and  ?has been authorized for detection and/or diagnosis of SARS-CoV-2 by ?FDA under an Emergency Use Authorization (EUA). This EUA will remain  ?in effect (meaning this test can be used) for the duration of the ?COVID-19 declaration under  Section 564(b)(1) of the Act, 21 ?U.S.C.section 360bbb-3(b)(1), unless the authorization is terminated  ?or revoked sooner.  ? ? ?  ? Influenza A by PCR NEGATIVE NEGATIVE Final  ? Influenza B by PCR NEGATIVE NEGATIVE Final  ?  Comment: (NOTE) ?The Xpert Xpress SARS-CoV-2/FLU/RSV plus assay is intended as an aid ?in the diagnosis of influenza from Nasopharyngeal swab specimens and ?should not be used as a sole basis for treatment. Nasal washings and ?aspirates are unacceptable for Xpert Xpress SARS-CoV-2/FLU/RSV ?testing. ? ?Fact Sheet for Patients: ?EntrepreneurPulse.com.au ? ?Fact Sheet for Healthcare Providers: ?IncredibleEmployment.be ? ?This test is not yet approved or cleared by the [place] FDA and ?has been authorized for detection and/or  diagnosis of SARS-CoV-2 by ?FDA under an Emergency Use Authorization (EUA). This EUA will remain ?in effect (meaning this test can be used) for the duration of the ?COVID-19 declaration under Section 564(b)(1) of the

## 2021-08-27 NOTE — ED Notes (Signed)
Lab called to get morning labs ?

## 2021-08-28 DIAGNOSIS — R627 Adult failure to thrive: Secondary | ICD-10-CM | POA: Diagnosis not present

## 2021-08-28 LAB — URINE CULTURE

## 2021-08-28 MED ORDER — ACETAMINOPHEN 500 MG PO TABS
1000.0000 mg | ORAL_TABLET | Freq: Three times a day (TID) | ORAL | Status: DC | PRN
  Administered 2021-08-29 – 2021-09-04 (×7): 1000 mg via ORAL
  Filled 2021-08-28 (×7): qty 2

## 2021-08-28 NOTE — Progress Notes (Signed)
PT Cancellation Note ? ?Patient Details ?Name: Chad [name] ?MRN: [id number] ?DOB: 26-Aug-1950 ? ? ?Cancelled Treatment:    Reason Eval/Treat Not Completed: Patient's level of consciousness ?Pt sleeping soundly on arrival, did not wake on numerous attempts.  Finally he partially opened one eye, groaned "I'm sleepy" and fell back asleep.  Able to get some minimal reaction with repeated loud calling of his name and sternal/shoulder rubs - all w/o any real success in getting him to wake. ? ?Malachi Pro, DPT ?08/28/2021, 5:41 PM ?

## 2021-08-28 NOTE — Assessment & Plan Note (Addendum)
--  from alcohol abuse, now is dependent on ADL's. ? ?

## 2021-08-28 NOTE — Progress Notes (Signed)
?  Progress Note ? ? ?Patient: Chad [name] PIR:[id number] DOB: 29-Aug-1950 DOA: 08/26/2021     1 ?DOS: the patient was seen and examined on 08/28/2021 ?  ?Brief hospital course: ?Mr. Quientin Jent is a 71 year old male with history of alcohol abuse, hypertension, depression, anxiety, history of peptic ulcer disease, nicotine dependence, GERD, hepatic steatosis, diverticulosis, who presents to the emergency department for chief concerns of failure to thrive, sitting in a chair for 1 month, swelling in his lower extremities. ? ?Initial vitals in the emergency department showed temperature of 98.9, respiration rate of 17, heart rate of 96, blood pressure 127/82, SPO2 of 98% on room air. ? ?Serum sodium 134, potassium 5.0, chloride 100, bicarb 25, BUN less than 5, serum creatinine of 0.80, nonfasting blood glucose 99, GFR greater than 60.  WBC 5.9, hemoglobin 12.8, platelets of 228. GFR greater than 60. ? ?CK is 58. COVID/influenza A/influenza B PCR were negative. ? ?Lactic acid is 3.5. ? ?ED treatment: Ativan 2 mg IV twice, thiamine 100 mg IV, LR 150 mL/h, sodium chloride 1 L bolus x2 with sodium chloride 250 mL bolus. ? ?Assessment and Plan: ?* Failure to thrive in adult ?- PT/OT/TOC has been consulted for placement ? ?Anxiety and depression ?- Resume trazodone 150 mg nightly, venlafaxine to 25 mg p.o. daily before breakfast, mirtazapine 30 mg nightly ?- Hydroxyzine 25 mg every 6 hours as needed for itching, anxiety ? ?Adult failure to thrive ?--from alcohol abuse ? ? ?Contact dermatitis ?Sacral ulcer, ruled out ?--contact dermatitis from exposure from his stool and urine ?--wound care consulted ?--barrier cream ? ?Dysuria ?-urine cx with multiple species present, suggesting chronic colonization.  ? ?Essential hypertension ?-Patient has not been eating or taking his medications for at least 1 month ?- cont Metoprolol succinate 25 mg daily  ? ? ?Elevated lactic acid level ?--trend ? ?Alcohol withdrawal (HCC) ?- Status  post Ativan 2 mg, x2 ordered by EDP ?- CIWA protocol initiated, thiamine, folic acid daily ordered ? ? ? ? ?  ? ?Subjective:  ?Pt complained of chronic back pain.  Pt said he can't feed himself, can't walk. ? ? ?Physical Exam: ? ?Constitutional: NAD, AAOx3 ?HEENT: conjunctivae and lids normal, EOMI ?CV: No cyanosis.   ?RESP: normal respiratory effort, on RA ?SKIN: warm, dry ? ? ?Data Reviewed: ? ?Family Communication:  ? ?Disposition: ?Status is: Inpatient ? ? Planned Discharge Destination:  undetermined ? ? ? ?Time spent: 50 minutes ? ?Author: ?Darlin Priestly, MD ?08/28/2021 9:02 PM ? ?For on call review [email or web address].  ?

## 2021-08-28 NOTE — Plan of Care (Signed)
  Problem: Safety: Goal: Ability to remain free from injury will improve Outcome: Progressing   Problem: Skin Integrity: Goal: Risk for impaired skin integrity will decrease Outcome: Progressing   

## 2021-08-29 DIAGNOSIS — F10230 Alcohol dependence with withdrawal, uncomplicated: Secondary | ICD-10-CM

## 2021-08-29 LAB — CBC
HCT: 34 % — ABNORMAL LOW (ref 39.0–52.0)
Hemoglobin: 10.9 g/dL — ABNORMAL LOW (ref 13.0–17.0)
MCH: 29.9 pg (ref 26.0–34.0)
MCHC: 32.1 g/dL (ref 30.0–36.0)
MCV: 93.2 fL (ref 80.0–100.0)
Platelets: 176 10*3/uL (ref 150–400)
RBC: 3.65 MIL/uL — ABNORMAL LOW (ref 4.22–5.81)
RDW: 14.4 % (ref 11.5–15.5)
WBC: 5.6 10*3/uL (ref 4.0–10.5)
nRBC: 0 % (ref 0.0–0.2)

## 2021-08-29 LAB — BASIC METABOLIC PANEL
Anion gap: 4 — ABNORMAL LOW (ref 5–15)
BUN: 8 mg/dL (ref 8–23)
CO2: 25 mmol/L (ref 22–32)
Calcium: 7.9 mg/dL — ABNORMAL LOW (ref 8.9–10.3)
Chloride: 105 mmol/L (ref 98–111)
Creatinine, Ser: 0.7 mg/dL (ref 0.61–1.24)
GFR, Estimated: >60 mL/min (ref >60)
Glucose, Bld: 97 mg/dL (ref 70–99)
Potassium: 4 mmol/L (ref 3.5–5.1)
Sodium: 134 mmol/L — ABNORMAL LOW (ref 135–145)

## 2021-08-29 LAB — PHOSPHORUS: Phosphorus: 4.8 mg/dL — ABNORMAL HIGH (ref 2.5–4.6)

## 2021-08-29 LAB — MAGNESIUM: Magnesium: 1.8 mg/dL (ref 1.7–2.4)

## 2021-08-29 NOTE — [place] FL2 (Signed)
?[place] MEDICAID FL2 LEVEL OF CARE SCREENING TOOL  ?  ? ?IDENTIFICATION  ?Patient Name: ?Chad [name] Birthdate: 20-Jan-1951 Sex: male Admission Date (Current Location): ?08/26/2021  ?[place] and [place] Number: ? [place] ?  Facility and Address:  ?[facility], [address], [place], [place] [address] ?     Provider Number: ?[id number]  ?Attending Physician Name and Address:  ?Enzo Bi, MD ? Relative Name and Phone Number:  ?  ?   ?Current Level of Care: ?Hospital Recommended Level of Care: ?[place] Prior Approval Number: ?  ? ?Date Approved/Denied: ?  PASRR Number: ?  ? ?Discharge Plan: ?SNF ?  ? ?Current Diagnoses: ?Patient Active Problem List  ? Diagnosis Date Noted  ? Adult failure to thrive 08/27/2021  ? Alcohol withdrawal ([place]) 08/26/2021  ? Elevated lactic acid level 08/26/2021  ? Essential hypertension 08/26/2021  ? Dysuria 08/26/2021  ? Contact dermatitis 08/26/2021  ? Major depressive disorder, recurrent severe without psychotic features ([place]) 04/05/2021  ? Hematemesis 04/01/2021  ? Duodenal ulcer   ? Erosive esophagitis   ? Esophagitis   ? Acute gastric ulcer without hemorrhage or perforation   ? Suicidal ideation   ? GI bleed 03/31/2021  ? Severe recurrent major depression without psychotic features ([place]) 01/28/2021  ? Failure to thrive in adult   ? Severe recurrent major depression without psychotic features ([place]) 02/08/2020  ? Chest pain   ? Alcohol dependence with uncomplicated withdrawal ([place]) 01/23/2019  ? Anxiety and depression 01/23/2019  ? Acute alcoholic liver disease [id number]  ? Primary osteoarthritis of both hands 12/29/2017  ? Psoriasis 12/29/2017  ? Hepatitis C antibody test positive 10/31/2013  ? ? ?Orientation RESPIRATION BLADDER Height & Weight   ?  ?Self, Place ? Normal Continent Weight: 156 lb 8.4 oz (71 kg) ?Height:  5\' 10"  (177.8 cm)  ?BEHAVIORAL SYMPTOMS/MOOD NEUROLOGICAL BOWEL NUTRITION STATUS  ?    Continent Diet (heart healthy,  thin liquids)  ?AMBULATORY STATUS COMMUNICATION OF NEEDS Skin   ?Extensive Assist Verbally Normal ?  ?  ?  ?    ?     ?     ? ? ?Personal Care Assistance Level of Assistance  ?Bathing, Feeding, Dressing Bathing Assistance: Maximum assistance ?Feeding assistance: Limited assistance ?Dressing Assistance: Maximum assistance ?   ? ?Functional Limitations Info  ?Sight, Hearing, Speech Sight Info: Adequate ?Hearing Info: Adequate ?Speech Info: Adequate  ? ? ?SPECIAL CARE FACTORS FREQUENCY  ?PT (By licensed PT), OT (By licensed OT)   ?  ?PT Frequency: 5x ?OT Frequency: 5x ?  ?  ?  ?   ? ? ?Contractures Contractures Info: Not present  ? ? ?Additional Factors Info  ?Code Status, Allergies Code Status Info: Partial ?Allergies Info: Morphine And Related, Ibuprofen ?  ?  ?  ?   ? ?Current Medications (08/29/2021):  This is the current hospital active medication list ?Current Facility-Administered Medications  ?Medication Dose Route Frequency Provider Last Rate Last Admin  ? acetaminophen (TYLENOL) tablet 1,000 mg  1,000 mg Oral TID PRN Enzo Bi, MD      ? enoxaparin (LOVENOX) injection 40 mg  40 mg Subcutaneous Q24H Cox, Amy N, DO   40 mg at 08/28/21 2108  ? feeding supplement (ENSURE ENLIVE / ENSURE PLUS) liquid 237 mL  237 mL Oral BID BM Cox, Amy N, DO   237 mL at [id number]  ? folic acid (FOLVITE) tablet 1 mg  1 mg Oral Daily Cox, Amy N, DO  1 mg at 08/29/21 0755  ? Gerhardt's butt cream   Topical QID Ralene Muskrat B, MD   Given at 08/29/21 1337  ? hydrOXYzine (ATARAX) tablet 25 mg  25 mg Oral Q6H PRN Cox, Amy N, DO   25 mg at 08/28/21 1001  ? LORazepam (ATIVAN) tablet 1-4 mg  1-4 mg Oral Q1H PRN Cox, Amy N, DO   1 mg at 08/28/21 1239  ? Or  ? LORazepam (ATIVAN) injection 1-4 mg  1-4 mg Intravenous Q1H PRN Cox, Amy N, DO   2 mg at 08/27/21 0810  ? MEDLINE mouth rinse  15 mL Mouth Rinse BID Ralene Muskrat B, MD   15 mL at 08/28/21 2109  ? metoprolol succinate (TOPROL-XL) 24 hr tablet 25 mg  25 mg Oral Daily Cox,  Amy N, DO   25 mg at 08/29/21 0755  ? mirtazapine (REMERON) tablet 30 mg  30 mg Oral QHS Cox, Amy N, DO   30 mg at 08/28/21 2108  ? multivitamin with minerals tablet 1 tablet  1 tablet Oral Daily Cox, Amy N, DO   1 tablet at 08/29/21 0755  ? nicotine (NICODERM CQ - dosed in mg/24 hours) patch 21 mg  21 mg Transdermal Daily Cox, Amy N, DO   21 mg at 08/29/21 0754  ? ondansetron (ZOFRAN) tablet 4 mg  4 mg Oral Q6H PRN Cox, Amy N, DO      ? Or  ? ondansetron (ZOFRAN) injection 4 mg  4 mg Intravenous Q6H PRN Cox, Amy N, DO      ? pantoprazole (PROTONIX) EC tablet 40 mg  40 mg Oral BID Cox, Amy N, DO   40 mg at 08/29/21 0755  ? polyethylene glycol (MIRALAX / GLYCOLAX) packet 17 g  17 g Oral Daily PRN Cox, Amy N, DO   17 g at 08/29/21 0754  ? sucralfate (CARAFATE) 1 GM/10ML suspension 1 g  1 g Oral TID WC & HS Cox, Amy N, DO   1 g at 08/29/21 0754  ? thiamine tablet 100 mg  100 mg Oral Daily Cox, Amy N, DO   100 mg at 08/29/21 0755  ? Or  ? thiamine (B-1) injection 100 mg  100 mg Intravenous Daily Cox, Amy N, DO   100 mg at 08/27/21 0815  ? traZODone (DESYREL) tablet 150 mg  150 mg Oral QHS Cox, Amy N, DO   150 mg at 08/28/21 2108  ? venlafaxine XR (EFFEXOR-XR) 24 hr capsule 225 mg  225 mg Oral QPC breakfast Cox, Amy N, DO   225 mg at 08/29/21 0755  ? ? ? ?Discharge Medications: ?Please see discharge summary for a list of discharge medications. ? ?Relevant Imaging Results: ? ?Relevant Lab Results: ? ? ?Additional Information ?[id number] ? ?Sonnie Bias A Annalissa Murphey, LCSW ? ? ? ? ?

## 2021-08-29 NOTE — Assessment & Plan Note (Addendum)
--  detoxed while inpatient. ?

## 2021-08-29 NOTE — Assessment & Plan Note (Signed)
--  cont Remeron, Effexor ?

## 2021-08-29 NOTE — Progress Notes (Signed)
Physical Therapy Treatment ?Patient Details ?Name: Chad [name] ?MRN: [id number] ?DOB: 05/25/1950 ?Today's Date: 08/29/2021 ? ? ?History of Present Illness 71 year old male with history of alcohol abuse, hypertension, depression, anxiety, history of peptic ulcer disease, nicotine dependence, GERD, hepatic steatosis, diverticulosis, who presents to the emergency department/admitted for chief concerns of failure to thrive, sitting in a chair for 1 month, swelling in his lower extremities ? ?  ?PT Comments  ? ? Pt received in Semi-Fowler's position and pt was asleep.  Pt able to wake up occasionally throughout session and was given maximal encouragement throughout as well.  Pt continues to require assistance with LE's due to weakness and stiffness of the lumbar spine and hips.  Pt also continues to require assistance with sitting balance, utilizing R UE for support throughout.  Pt performed STS x3 with modA from therapist.  Pt attempted to perform side step on third and final attempt, but was unable to get lift-off of the foot.  Pt then assisted back into bed with LE navigation.  Pt left with all needs met and call bell within reach.  Current discharge plans to SNF remain appropriate at this time.  Pt will continue to benefit from skilled therapy in order to address deficits listed below. ? ?  ?Recommendations for follow up therapy are one component of a multi-disciplinary discharge planning process, led by the attending physician.  Recommendations may be updated based on patient status, additional functional criteria and insurance authorization. ? ?Follow Up Recommendations ? Skilled nursing-short term rehab (<3 hours/day) ?  ?  ?Assistance Recommended at Discharge Frequent or constant Supervision/Assistance  ?Patient can return home with the following A lot of help with walking and/or transfers;A lot of help with bathing/dressing/bathroom ?  ?Equipment Recommendations ?    ?  ?Recommendations for Other Services    ? ? ?  ?Precautions / Restrictions Precautions ?Precautions: Fall ?Restrictions ?Weight Bearing Restrictions: No  ?  ? ?Mobility ? Bed Mobility ?Overal bed mobility: Needs Assistance ?Bed Mobility: Supine to Sit, Sit to Supine ?  ?  ?Supine to sit: Mod assist, Max assist ?Sit to supine: Max assist ?  ?General bed mobility comments: continues to be stiff throughout the back and hips.  pt requires UE assistance to stay upright, otherwise has posterior bias and lies backwards in bed. ?  ? ?Transfers ?Overall transfer level: Needs assistance ?Equipment used: Rolling walker (2 wheels) ?Transfers: Sit to/from Stand ?Sit to Stand: Mod assist ?  ?  ?  ?  ?  ?General transfer comment: pt able to perform with modA, however fatigues quickly after standing upright and sits back at EOB.  Performed x3, with the third attempt having pt attempt to side-step, but unable to perform. ?  ? ?Ambulation/Gait ?  ?  ?  ?  ?  ?  ?  ?General Gait Details: Deferred secondary to poor sitting balance, poor activity tolerance; do anticipate need for +2 to safely attempt/complete ? ? ?Stairs ?  ?  ?  ?  ?  ? ? ?Wheelchair Mobility ?  ? ?Modified Rankin (Stroke Patients Only) ?  ? ? ?  ?Balance Overall balance assessment: Needs assistance ?Sitting-balance support: No upper extremity supported, Feet supported ?Sitting balance-Leahy Scale: Poor ?Sitting balance - Comments: cga with R UE support; mod assist with no UE support ?Postural control: Right lateral lean ?Standing balance support: Bilateral upper extremity supported ?Standing balance-Leahy Scale: Poor ?  ?  ?  ?  ?  ?  ?  ?  ?  ?  ?  ?  ?  ? ?  ?  Cognition Arousal/Alertness: Lethargic ?Behavior During Therapy: Flat affect ?Overall Cognitive Status: No family/caregiver present to determine baseline cognitive functioning ?  ?  ?  ?  ?  ?  ?  ?  ?  ?  ?  ?  ?  ?  ?  ?  ?General Comments: Pt keeps eyes closed during session. He follows some commands but requires constant encouragement ?  ?  ? ?   ?Exercises   ? ?  ?General Comments   ?  ?  ? ?Pertinent Vitals/Pain Pain Assessment ?Pain Assessment: Faces ?Faces Pain Scale: Hurts little more ?Pain Location: R shoulder ?Pain Descriptors / Indicators: Guarding, Grimacing, Moaning ?Pain Intervention(s): Limited activity within patient's tolerance, Monitored during session, Repositioned  ? ? ?Home Living   ?  ?  ?  ?  ?  ?  ?  ?  ?  ?   ?  ?Prior Function    ?  ?  ?   ? ?PT Goals (current goals can now be found in the care plan section) Acute Rehab PT Goals ?Patient Stated Goal: to get a hot meal ?PT Goal Formulation: With patient ?Time For Goal Achievement: 09/10/21 ?Potential to Achieve Goals: Fair ?Progress towards PT goals: Progressing toward goals ? ?  ?Frequency ? ? ? Min 2X/week ? ? ? ?  ?PT Plan    ? ? ?Co-evaluation   ?  ?  ?  ?  ? ?  ?AM-PAC PT "6 Clicks" Mobility   ?Outcome Measure ? Help needed turning from your back to your side while in a flat bed without using bedrails?: A Lot ?Help needed moving from lying on your back to sitting on the side of a flat bed without using bedrails?: A Lot ?Help needed moving to and from a bed to a chair (including a wheelchair)?: Total ?Help needed standing up from a chair using your arms (e.g., wheelchair or bedside chair)?: A Lot ?Help needed to walk in hospital room?: Total ?Help needed climbing 3-5 steps with a railing? : Total ?6 Click Score: 9 ? ?  ?End of Session   ?Activity Tolerance: Patient limited by pain;Patient limited by fatigue ?Patient left: in bed;with bed alarm set;with call bell/phone within reach ?Nurse Communication: Mobility status ?PT Visit Diagnosis: Muscle weakness (generalized) (M62.81);Difficulty in walking, not elsewhere classified (R26.2);Pain ?Pain - Right/Left: Right ?Pain - part of body: Shoulder ?  ? ? ?Time: 1950-9326 ?PT Time Calculation (min) (ACUTE ONLY): 14 min ? ?Charges:  $Therapeutic Activity: 8-22 mins          ?          ? ?Gwenlyn Saran, PT, DPT ?08/29/21, 5:03  PM ? ? ? ?Christie Nottingham ?08/29/2021, 4:50 PM ? ?

## 2021-08-29 NOTE — TOC Progression Note (Signed)
Transition of Care (TOC) - Progression Note  ? ? ?Patient Details  ?Name: Chad [name] ?MRN: [id number] ?Date of Birth: Sep 09, 1950 ? ?Transition of Care (TOC) CM/SW Contact  ?Vali Capano A Cayli Escajeda, LCSW ?Phone Number: ?08/29/2021, 4:00 PM ? ?Clinical Narrative:   Pt alert and oriented x2. CSW called pt's Niece-- left voicemail. Anticipate placement being a barrier. PASRR has not been started-- will have to wait until placement is determined so PASRR doesn't expire prior to.  ? ? ? ?  ?  ? ?Expected Discharge Plan and Services ?  ?  ?  ?  ?  ?                ?  ?  ?  ?  ?  ?  ?  ?  ?  ?  ? ? ?Social Determinants of Health (SDOH) Interventions ?  ? ?Readmission Risk Interventions ? ?  01/26/2021  ?  4:20 PM  ?Readmission Risk Prevention Plan  ?Transportation Screening Complete  ?PCP or Specialist Appt within 3-5 Days Complete  ?HRI or Home Care Consult Complete  ?Social Work Consult for Recovery Care Planning/Counseling Complete  ?Palliative Care Screening Not Applicable  ?Medication Review Oceanographer) Complete  ? ? ?

## 2021-08-29 NOTE — Progress Notes (Signed)
?  Progress Note ? ? ?Patient: Chad [name] [id number] DOB: 09/14/1950 DOA: 08/26/2021     2 ?DOS: the patient was seen and examined on 08/29/2021 ?  ?Brief hospital course: ?[name] is a 71 year old male with history of alcohol abuse, hypertension, depression, anxiety, history of peptic ulcer disease, nicotine dependence, GERD, hepatic steatosis, diverticulosis, who presents to the emergency department for chief concerns of failure to thrive, sitting in a chair for 1 month, swelling in his lower extremities. ? ?Initial vitals in the emergency department showed temperature of 98.9, respiration rate of 17, heart rate of 96, blood pressure 127/82, SPO2 of 98% on room air. ? ?Serum sodium 134, potassium 5.0, chloride 100, bicarb 25, BUN less than 5, serum creatinine of 0.80, nonfasting blood glucose 99, GFR greater than 60.  WBC 5.9, hemoglobin 12.8, platelets of 228. GFR greater than 60. ? ?CK is 58. COVID/influenza A/influenza B PCR were negative. ? ?Lactic acid is 3.5. ? ?ED treatment: Ativan 2 mg IV twice, thiamine 100 mg IV, LR 150 mL/h, sodium chloride 1 L bolus x2 with sodium chloride 250 mL bolus. ? ?Assessment and Plan: ?Anxiety and depression ?- cont trazodone 150 mg nightly, venlafaxine to 25 mg p.o. daily before breakfast, mirtazapine 30 mg nightly ?- Hydroxyzine 25 mg every 6 hours as needed for itching, anxiety ? ?Alcohol dependence with uncomplicated withdrawal ([place]) ?--will detox while inpatient. ? ?Adult failure to thrive ?--from alcohol abuse, now is dependent on ADL's. ? ? ?Contact dermatitis ?Sacral ulcer, ruled out ?--contact dermatitis from exposure from his stool and urine ?--wound care consulted ?--barrier cream ? ?Dysuria ?-urine cx with multiple species present, suggesting chronic colonization.  ?--did not start on abx ? ?Essential hypertension ?-Patient has not been eating or taking his medications for at least 1 month ?- cont Metoprolol succinate 25 mg daily  ? ? ?Elevated lactic  acid level ?--trend ? ?Major depressive disorder, recurrent severe without psychotic features ([place]) ?--cont Remeron, Effexor ? ? ? ? ?  ? ?Subjective:  ?No significant withdrawal symptoms.  Pt mostly refused to interact with staff. ? ? ?Physical Exam: ? ?Constitutional: NAD, eyes closed but awake ?HEENT: conjunctivae and lids normal, EOMI ?CV: No cyanosis.   ?RESP: normal respiratory effort, on RA ?SKIN: warm, dry ? ? ?Data Reviewed: ? ?Family Communication:  ? ?Disposition: ?Status is: Inpatient ? ? Planned Discharge Destination:  undetermined ? ? ? ?Time spent: 35 minutes ? ?Author: ?Enzo Bi, MD ?08/29/2021 5:27 PM ? ?For on call review [email or web address]  ?

## 2021-08-30 DIAGNOSIS — R627 Adult failure to thrive: Secondary | ICD-10-CM | POA: Diagnosis not present

## 2021-08-30 DIAGNOSIS — E43 Unspecified severe protein-calorie malnutrition: Secondary | ICD-10-CM | POA: Insufficient documentation

## 2021-08-30 DIAGNOSIS — F10230 Alcohol dependence with withdrawal, uncomplicated: Secondary | ICD-10-CM | POA: Diagnosis not present

## 2021-08-30 LAB — CBC
HCT: 37.9 % — ABNORMAL LOW (ref 39.0–52.0)
Hemoglobin: 12.1 g/dL — ABNORMAL LOW (ref 13.0–17.0)
MCH: 29.4 pg (ref 26.0–34.0)
MCHC: 31.9 g/dL (ref 30.0–36.0)
MCV: 92.2 fL (ref 80.0–100.0)
Platelets: 199 10*3/uL (ref 150–400)
RBC: 4.11 MIL/uL — ABNORMAL LOW (ref 4.22–5.81)
RDW: 14.3 % (ref 11.5–15.5)
WBC: 8.7 10*3/uL (ref 4.0–10.5)
nRBC: 0 % (ref 0.0–0.2)

## 2021-08-30 LAB — MAGNESIUM: Magnesium: 1.9 mg/dL (ref 1.7–2.4)

## 2021-08-30 LAB — BASIC METABOLIC PANEL
Anion gap: 5 (ref 5–15)
BUN: 9 mg/dL (ref 8–23)
CO2: 27 mmol/L (ref 22–32)
Calcium: 8.3 mg/dL — ABNORMAL LOW (ref 8.9–10.3)
Chloride: 104 mmol/L (ref 98–111)
Creatinine, Ser: 0.66 mg/dL (ref 0.61–1.24)
GFR, Estimated: >60 mL/min (ref >60)
Glucose, Bld: 98 mg/dL (ref 70–99)
Potassium: 3.9 mmol/L (ref 3.5–5.1)
Sodium: 136 mmol/L (ref 135–145)

## 2021-08-30 LAB — CULTURE, BLOOD (ROUTINE X 2): Special Requests: ADEQUATE

## 2021-08-30 LAB — PHOSPHORUS: Phosphorus: 4.1 mg/dL (ref 2.5–4.6)

## 2021-08-30 MED ORDER — ENSURE ENLIVE PO LIQD
237.0000 mL | Freq: Three times a day (TID) | ORAL | Status: DC
  Administered 2021-08-31 – 2021-09-10 (×32): 237 mL via ORAL

## 2021-08-30 NOTE — Plan of Care (Signed)
  Problem: Clinical Measurements: Goal: Will remain free from infection Outcome: Progressing   Problem: Safety: Goal: Ability to remain free from injury will improve Outcome: Progressing   

## 2021-08-30 NOTE — Care Management Important Message (Signed)
Important Message ? ?Patient Details  ?Name: Chad [name] ?MRN: [id number] ?Date of Birth: 1950-07-31 ? ? ?Medicare Important Message Given:  N/A - LOS <3 / Initial given by admissions ? ? ? ? ?Olegario Messier A Iza Preston ?08/30/2021, 9:33 AM ?

## 2021-08-30 NOTE — Progress Notes (Signed)
?  Progress Note ? ? ?Patient: Chad [name] [id number] DOB: 07/06/1950 DOA: 08/26/2021     3 ?DOS: the patient was seen and examined on 08/30/2021 ?  ?Brief hospital course: ?[name] is a 71 year old male with history of alcohol abuse, hypertension, depression, anxiety, history of peptic ulcer disease, nicotine dependence, GERD, hepatic steatosis, diverticulosis, who presents to the emergency department for chief concerns of failure to thrive, sitting in a chair for 1 month, swelling in his lower extremities. ? ?Initial vitals in the emergency department showed temperature of 98.9, respiration rate of 17, heart rate of 96, blood pressure 127/82, SPO2 of 98% on room air. ? ?Serum sodium 134, potassium 5.0, chloride 100, bicarb 25, BUN less than 5, serum creatinine of 0.80, nonfasting blood glucose 99, GFR greater than 60.  WBC 5.9, hemoglobin 12.8, platelets of 228. GFR greater than 60. ? ?CK is 58. COVID/influenza A/influenza B PCR were negative. ? ?Lactic acid is 3.5. ? ?ED treatment: Ativan 2 mg IV twice, thiamine 100 mg IV, LR 150 mL/h, sodium chloride 1 L bolus x2 with sodium chloride 250 mL bolus. ? ?Assessment and Plan: ?Anxiety and depression ?- cont trazodone 150 mg nightly, venlafaxine to 25 mg p.o. daily before breakfast, mirtazapine 30 mg nightly ?- Hydroxyzine 25 mg every 6 hours as needed for itching, anxiety ? ?Alcohol dependence with uncomplicated withdrawal ([place]) ?--will detox while inpatient. ? ?Adult failure to thrive ?--from alcohol abuse, now is dependent on ADL's. ? ? ?Contact dermatitis ?Sacral ulcer, ruled out ?--contact dermatitis from exposure from his stool and urine ?--wound care consulted ?--barrier cream ? ?Dysuria ?-urine cx with multiple species present, suggesting chronic colonization.  ?--did not start on abx ? ?Essential hypertension ?-Patient has not been eating or taking his medications for at least 1 month ?- cont Metoprolol succinate 25 mg daily  ? ? ?Elevated lactic  acid level ?--trend ? ?Major depressive disorder, recurrent severe without psychotic features ([place]) ?--cont Remeron, Effexor ? ? ? ? ?  ? ?Subjective:  ?Pt was more awake and interactive today, due to his daughter visiting. ? ? ?Physical Exam: ? ?Constitutional: NAD, AAOx3 ?HEENT: conjunctivae and lids normal, EOMI ?CV: No cyanosis.   ?RESP: normal respiratory effort, on RA ?SKIN: warm, dry ? ? ?Data Reviewed: ? ?Family Communication: daughter updated at bedside today ? ?Disposition: ?Status is: Inpatient ? ? Planned Discharge Destination: Skilled nursing facility ? ? ? ?Time spent: 35 minutes ? ?Author: ?Enzo Bi, MD ?08/30/2021 7:41 PM ? ?For on call review [email or web address]  ?

## 2021-08-30 NOTE — Progress Notes (Signed)
Physical Therapy Treatment ?Patient Details ?Name: Chad [name] ?MRN: [id number] ?DOB: [birth year] ?Today's Date: 08/30/2021 ? ? ?History of Present Illness 71 year old male with history of alcohol abuse, hypertension, depression, anxiety, history of peptic ulcer disease, nicotine dependence, GERD, hepatic steatosis, diverticulosis, who presents to the emergency department/admitted for chief concerns of failure to thrive, sitting in a chair for 1 month, swelling in his lower extremities ? ?  ?PT Comments  ? ? Pt in bed on entry more aware today, but at EOB becomes more drowsy and has progressive lightheadedness. Pt is found to be orthostatic at EOB. PT more motivated today, but remains very lmited. Pt struggles to DC his left lateral lean at EOB, even when provided with a guest chair to support self anteriorly. Assisted pt with face washing and cleaning his monocle. Pt returned to bed to recliner position, HOB at 35 degrees at exit, encouraged to remain upright today to assist with orthostatic BP and lightheadedness. Do no recommend OOB to chair at this time given several days of somnolence and cognitive imapirment- his safety and postural needs can be better met while in bed this date.  ? ?  ?Recommendations for follow up therapy are one component of a multi-disciplinary discharge planning process, led by the attending physician.  Recommendations may be updated based on patient status, additional functional criteria and insurance authorization. ? ?Follow Up Recommendations ? Skilled nursing-short term rehab (<3 hours/day) ?  ?  ?Assistance Recommended at Discharge Frequent or constant Supervision/Assistance  ?Patient can return home with the following A lot of help with walking and/or transfers;A lot of help with bathing/dressing/bathroom ?  ?Equipment Recommendations ?    ?  ?Recommendations for Other Services   ? ? ?  ?Precautions / Restrictions Precautions ?Precautions: Fall ?Restrictions ?Weight Bearing  Restrictions: No  ?  ? ?Mobility ? Bed Mobility ?Overal bed mobility: Needs Assistance ?Bed Mobility: Supine to Sit, Sit to Supine ?  ?  ?Supine to sit: Min assist ?Sit to supine: Min assist ?  ?General bed mobility comments: gives great effort, but weakness prevails ?  ? ?Transfers ?Overall transfer level:  (deferred; bigger focus on postural orientation to gravity at EOB and functional tasks) ?  ?  ?  ?  ?  ?  ?  ?  ?  ?  ? ?Ambulation/Gait ?  ?  ?  ?  ?  ?  ?  ?  ? ? ?Stairs ?  ?  ?  ?  ?  ? ? ?Wheelchair Mobility ?  ? ?Modified Rankin (Stroke Patients Only) ?  ? ? ?  ?Balance   ?  ?  ?  ?  ?  ?  ?  ?  ?  ?  ?  ?  ?  ?  ?  ?  ?  ?  ?  ? ?  ?Cognition   ?  ?  ?  ?  ?  ?  ?  ?  ?  ?  ?  ?  ?  ?  ?  ?  ?  ?  ?  ?  ?  ? ?  ?Exercises Other Exercises ?Other Exercises: stiing EOB x10 minutes, intermittent modA to correct Left trunk lean/collapse, set up for washing face, assessment of orthostatic vitals ? ?  ?General Comments   ?  ?  ? ?Pertinent Vitals/Pain    ? ? ?Home Living   ?  ?  ?  ?  ?  ?  ?  ?  ?  ?   ?  ?  Prior Function    ?  ?  ?   ? ?PT Goals (current goals can now be found in the care plan section) Acute Rehab PT Goals ?Patient Stated Goal: to get a hot meal ?PT Goal Formulation: With patient ?Time For Goal Achievement: 09/10/21 ?Potential to Achieve Goals: Fair ?Progress towards PT goals: Progressing toward goals ? ?  ?Frequency ? ? ? Min 2X/week ? ? ? ?  ?PT Plan Current plan remains appropriate  ? ? ?Co-evaluation   ?  ?  ?  ?  ? ?  ?AM-PAC PT "6 Clicks" Mobility   ?Outcome Measure ? Help needed turning from your back to your side while in a flat bed without using bedrails?: A Lot ?Help needed moving from lying on your back to sitting on the side of a flat bed without using bedrails?: A Lot ?Help needed moving to and from a bed to a chair (including a wheelchair)?: Total ?Help needed standing up from a chair using your arms (e.g., wheelchair or bedside chair)?: Total ?Help needed to walk in hospital  room?: Total ?Help needed climbing 3-5 steps with a railing? : Total ?6 Click Score: 8 ? ?  ?End of Session   ?Activity Tolerance: Patient limited by pain;Patient limited by fatigue ?Patient left: in bed;with bed alarm set;with call bell/phone within reach ?Nurse Communication: Mobility status ?PT Visit Diagnosis: Muscle weakness (generalized) (M62.81);Difficulty in walking, not elsewhere classified (R26.2) ?  ? ? ?Time: 8441-7127 ?PT Time Calculation (min) (ACUTE ONLY): 23 min ? ?Charges:  $Therapeutic Exercise: 23-37 mins          ?          ?12:22 PM, 08/30/21 ?Etta Grandchild, PT, DPT ?Physical Therapist - [place] ?[facility]  ?[phone] (ASCOM)  ? ? ?Chad [name] ?08/30/2021, 12:19 PM ? ?

## 2021-08-30 NOTE — TOC Progression Note (Signed)
Transition of Care (TOC) - Progression Note  ? ? ?Patient Details  ?Name: Chad [name] ?MRN: [id number] ?Date of Birth: 11-Nov-1950 ? ?Transition of Care (TOC) CM/SW Contact  ?Allayne Butcher, RN ?Phone Number: ?08/30/2021, 6:36 PM ? ?Clinical Narrative:    ?[name]F so that will not be an option but TOC will send out referral to [facility] [place].   ? ?Expected Discharge Plan: Skilled Nursing Facility ?Barriers to Discharge: Continued Medical Work up ? ?Expected Discharge Plan and Services ?Expected Discharge Plan: Skilled Nursing Facility ?  ?Discharge Planning Services: CM Consult ?Post Acute Care Choice: Skilled Nursing Facility ?Living arrangements for the past 2 months: Apartment ?                ?DME Arranged: N/A ?DME Agency: NA ?  ?  ?  ?HH Arranged: NA ?HH Agency: NA ?  ?  ?  ? ? ?Social Determinants of Health (SDOH) Interventions ?  ? ?Readmission Risk Interventions ? ?  01/26/2021  ?  4:20 PM  ?Readmission Risk Prevention Plan  ?Transportation Screening Complete  ?PCP or Specialist Appt within 3-5 Days Complete  ?[place] or Home Care Consult Complete  ?Social Work Consult for Recovery Care Planning/Counseling Complete  ?Palliative Care Screening Not Applicable  ?Medication Review Oceanographer) Complete  ? ? ?

## 2021-08-30 NOTE — Progress Notes (Signed)
Occupational Therapy Treatment ?Patient Details ?Name: Chad [name] ?MRN: [id number] ?DOB: [birth year] ?Today's Date: 08/30/2021 ? ? ?History of present illness 71 year old male with history of alcohol abuse, hypertension, depression, anxiety, history of peptic ulcer disease, nicotine dependence, GERD, hepatic steatosis, diverticulosis, who presents to the emergency department/admitted for chief concerns of failure to thrive, sitting in a chair for 1 month, swelling in his lower extremities ?  ?OT comments ? Chart reviewed, RN cleared pt for participation in OT tx session. Pt presents with improved orientation on this date oriented to self, place, year grossly to situation. Pt performs supine<>sit with MIN A, grooming with SET UP-MIN A. Attempted STS 2x with MOD A, pt unable to take steps up the bed to reposition. Fair-poor static and dynamic sitting at EOB with lateral and posterior lean noted. Pt able to self correct with vcs.  BP WNL throughout. Pt is left as received, NAD, all needs met. Discharge recommendations remain appropriate.   ? ?Recommendations for follow up therapy are one component of a multi-disciplinary discharge planning process, led by the attending physician.  Recommendations may be updated based on patient status, additional functional criteria and insurance authorization. ?   ?Follow Up Recommendations ? Skilled nursing-short term rehab (<3 hours/day)  ?  ?Assistance Recommended at Discharge Frequent or constant Supervision/Assistance  ?Patient can return home with the following ? A lot of help with walking and/or transfers;A lot of help with bathing/dressing/bathroom;Assistance with cooking/housework;Help with stairs or ramp for entrance;Assist for transportation ?  ?Equipment Recommendations ? Other (comment) (per next venue of care)  ?  ?Recommendations for Other Services   ? ?  ?Precautions / Restrictions Precautions ?Precautions: Fall ?Restrictions ?Weight Bearing Restrictions: No  ? ? ?   ? ?Mobility Bed Mobility ?Overal bed mobility: Needs Assistance ?Bed Mobility: Supine to Sit, Sit to Supine ?  ?  ?Supine to sit: Min assist, HOB elevated ?Sit to supine: Min assist, HOB elevated ?  ?  ?  ? ?Transfers ?  ?Equipment used: Rolling walker (2 wheels) ?Transfers: Sit to/from Stand ?Sit to Stand: Mod assist (STS 2 attempts, no report of dizziness) ?  ?  ?  ?  ?  ?General transfer comment: attempted steps up the bed, pt unbale to progress on2 attempts ?  ?  ?Balance Overall balance assessment: Needs assistance ?Sitting-balance support: No upper extremity supported, Feet supported ?  ?Sitting balance - Comments: vcs for upright sitting ?Postural control: Left lateral lean, Posterior lean ?  ?Standing balance-Leahy Scale: Zero ?Standing balance comment: MAX A to maintain standing ?  ?  ?  ?  ?  ?  ?  ?  ?  ?  ?  ?   ? ?ADL either performed or assessed with clinical judgement  ? ?ADL Overall ADL's : Needs assistance/impaired ?  ?  ?Grooming: Minimal assistance;Sitting ?  ?  ?  ?  ?  ?  ?  ?Lower Body Dressing: Maximal assistance ?  ?  ?  ?  ?  ?  ?  ?Functional mobility during ADLs: Moderate assistance (STS) ?  ?  ? ?Extremity/Trunk Assessment   ?  ?  ?  ?  ?  ? ?Vision   ?  ?  ?Perception   ?  ?Praxis   ?  ? ?Cognition Arousal/Alertness: Awake/alert ?Behavior During Therapy: Flat affect ?Overall Cognitive Status: Impaired/Different from baseline ?Area of Impairment: Orientation, Memory, Following commands, Safety/judgement, Awareness, Problem solving ?  ?  ?  ?  ?  ?  ?  ?  ?  Orientation Level: Disoriented to, Time ?  ?  ?Following Commands: Follows one step commands inconsistently ?Safety/Judgement: Decreased awareness of deficits ?Awareness: Intellectual ?Problem Solving: Slow processing, Requires verbal cues, Requires tactile cues ?General Comments: Oriented to self, place, grossly to situation, not oriented to month but oriented to year; Pt with improved participation ?  ?  ?   ?Exercises   ? ?   ?Shoulder Instructions   ? ? ?  ?General Comments BP 121/81 HR 67 in sitting, 113/47 HR 63 seated to EOB, unable to get orthostatics in standing  ? ? ?Pertinent Vitals/ Pain       Pain Assessment ?Pain Assessment: No/denies pain ? ?Home Living   ?  ?  ?  ?  ?  ?  ?  ?  ?  ?  ?  ?  ?  ?  ?  ?  ?  ?  ? ?  ?Prior Functioning/Environment    ?  ?  ?  ?   ? ?Frequency ? Min 2X/week  ? ? ? ? ?  ?Progress Toward Goals ? ?OT Goals(current goals can now be found in the care plan section) ? Progress towards OT goals: Progressing toward goals ? ?Acute Rehab OT Goals ?Patient Stated Goal: get stronger ?OT Goal Formulation: With patient ?Time For Goal Achievement: 09/13/21 ?Potential to Achieve Goals: Good  ?Plan Discharge plan remains appropriate   ? ?Co-evaluation ? ? ?   ?  ?  ?  ?  ? ?  ?AM-PAC OT "6 Clicks" Daily Activity     ?Outcome Measure ? ? Help from another person eating meals?: A Lot ?Help from another person taking care of personal grooming?: A Lot ?Help from another person toileting, which includes using toliet, bedpan, or urinal?: Total ?Help from another person bathing (including washing, rinsing, drying)?: A Lot ?Help from another person to put on and taking off regular upper body clothing?: A Lot ?Help from another person to put on and taking off regular lower body clothing?: Total ?6 Click Score: 10 ? ?  ?End of Session   ? ?OT Visit Diagnosis: Unsteadiness on feet (R26.81);Muscle weakness (generalized) (M62.81);History of falling (Z91.81) ?  ?Activity Tolerance Patient limited by lethargy ?  ?Patient Left in bed;with call bell/phone within reach;with bed alarm set ?  ?Nurse Communication Mobility status ?  ? ?   ? ?Time: 4097-3532 ?OT Time Calculation (min): 23 min ? ?Charges: OT General Charges ?$OT Visit: 1 Visit ?OT Treatments ?$Self Care/Home Management : 8-22 mins ?$Therapeutic Activity: 8-22 mins ? ?Shanon Payor, OTD OTR/L  ?08/30/21, 3:55 PM  ?

## 2021-08-30 NOTE — TOC Initial Note (Signed)
Transition of Care (TOC) - Initial/Assessment Note  ? ? ?Patient Details  ?Name: Chad [name] ?MRN: [id number] ?Date of Birth: August 10, 1950 ? ?Transition of Care (TOC) CM/SW Contact:    ?Shelbie Hutching, RN ?Phone Number: ?08/30/2021, 6:26 PM ? ?Clinical Narrative:                 ?Patient admitted to the hospital with alcohol dependency, failure to thrive.  Patient is from home where he lives alone.  Patient's daughter was visiting today and will be back tomorrow.  Daughter is from [place] and has been estranged from her father for the past 4 years because of his drug and alcohol use.  Daughter's cousin, Margaretha Sheffield, called and notified that her father was in the hospital.  She came because he is her father and has no one else, she would like to try and help him if he will let her.   ?Patient has agreed that he will go to [facility] for rehab and maybe even long term care, he will be close to his daughter there and she can visit him and help take care of him.   ? ?Daughter has 2 facilities in mind- [facility] in [place] and [facility] in [place].  She is okay with any placement as long as it is close to [employer].   ?TOC will follow up and send out referrals over the weekend and on Monday.   ? ?Expected Discharge Plan: [place] ?Barriers to Discharge: Continued Medical Work up ? ? ?Patient Goals and CMS Choice ?Patient states their goals for this hospitalization and ongoing recovery are:: Patient has agreed to go to [place] to be close to his daughter for SNF and then LTC ?CMS Medicare.gov Compare Post Acute Care list provided to:: Patient ?Choice offered to / list presented to : Patient, Adult Children ? ?Expected Discharge Plan and Services ?Expected Discharge Plan: [place] ?  ?Discharge Planning Services: CM Consult ?Post Acute Care Choice: [place] ?Living arrangements for the past 2 months: Apartment ?                ?DME Arranged: N/A ?DME Agency: NA ?  ?  ?   ?HH Arranged: NA ?[place] Agency: NA ?  ?  ?  ? ?Prior Living Arrangements/Services ?Living arrangements for the past 2 months: Apartment ?Lives with:: Self ?Patient language and need for interpreter reviewed:: Yes ?Do you feel safe going back to the place where you live?: No   can not go back to his apartment- getting evicted  ?Need for Family Participation in Patient Care: Yes (Comment) ?Care giver support system in place?: Yes (comment) (daughter) ?  ?Criminal Activity/Legal Involvement Pertinent to Current Situation/Hospitalization: No - Comment as needed ? ?Activities of Daily Living ?  ?  ? ?Permission Sought/Granted ?Permission sought to share information with : Case Manager, Customer service manager, Family Supports ?Permission granted to share information with : Yes, Verbal Permission Granted ?   ? Permission granted to share info w AGENCY: SNF's in [place] ? Permission granted to share info w Relationship: daughter ?   ? ?Emotional Assessment ?Appearance:: Appears older than stated age ?  ?  ?Orientation: : Oriented to Self, Oriented to Place, Oriented to  Time, Oriented to Situation ?Alcohol / Substance Use: Alcohol Use ?Psych Involvement: No (comment) ? ?Admission diagnosis:  Alcohol withdrawal ([place]) [F10.939] ?Adult failure to thrive [R62.7] ?Lactic acidosis [E87.20] ?Alcohol dependence with uncomplicated withdrawal ([place]) [F10.230] ?Patient Active Problem List  ?  Diagnosis Date Noted  ? Protein-calorie malnutrition, severe 08/30/2021  ? Adult failure to thrive 08/27/2021  ? Elevated lactic acid level 08/26/2021  ? Essential hypertension 08/26/2021  ? Dysuria 08/26/2021  ? Contact dermatitis 08/26/2021  ? Major depressive disorder, recurrent severe without psychotic features ([place]) 04/05/2021  ? Hematemesis 04/01/2021  ? Duodenal ulcer   ? Erosive esophagitis   ? Esophagitis   ? Acute gastric ulcer without hemorrhage or perforation   ? Suicidal ideation   ? GI bleed 03/31/2021  ? Severe recurrent  major depression without psychotic features ([place]) 01/28/2021  ? Severe recurrent major depression without psychotic features ([place]) 02/08/2020  ? Chest pain   ? Alcohol dependence with uncomplicated withdrawal ([place]) 01/23/2019  ? Anxiety and depression 01/23/2019  ? Acute alcoholic liver disease [id number]  ? Primary osteoarthritis of both hands 12/29/2017  ? Psoriasis 12/29/2017  ? Hepatitis C antibody test positive 10/31/2013  ? ?PCP:  Sharyne Peach, MD ?Pharmacy:   ?[id number] [place] - [address] ?[address] ?[place] [address] ?Phone: [phone] Fax: [phone] ? ?[facility] [place], [place] AT [facility] OF [address] ?[place] ?[place] [address] ?Phone: [phone] Fax: [phone] ? ? ? ? ?Social Determinants of Health (SDOH) Interventions ?  ? ?Readmission Risk Interventions ? ?  01/26/2021  ?  4:20 PM  ?Readmission Risk Prevention Plan  ?Transportation Screening Complete  ?PCP or Specialist Appt within 3-5 Days Complete  ?[place] or Home Care Consult Complete  ?Social Work Consult for [place] Planning/Counseling Complete  ?Palliative Care Screening Not Applicable  ?Medication Review Press photographer) Complete  ? ? ? ?

## 2021-08-30 NOTE — Progress Notes (Signed)
Initial Nutrition Assessment ? ?DOCUMENTATION CODES:  ? ?Severe malnutrition in context of chronic illness ? ?INTERVENTION:  ? ?-MVI with minerals daily ?-Ensure Enlive po TID, each supplement provides 350 kcal and 20 grams of protein ?-Liberalize diet to regular for wider variety of meal selections ? ?NUTRITION DIAGNOSIS:  ? ?Severe Malnutrition related to chronic illness (ETOH abuse) as evidenced by energy intake < or equal to 75% for > or equal to 1 month, moderate fat depletion, severe fat depletion, moderate muscle depletion, severe muscle depletion. ? ?GOAL:  ? ?Patient will meet greater than or equal to 90% of their needs ? ?MONITOR:  ? ?PO intake, Supplement acceptance ? ?REASON FOR ASSESSMENT:  ? ?Low [place] ?  ? ?ASSESSMENT:  ? ?Pt with history of alcohol abuse, hypertension, depression, anxiety, history of peptic ulcer disease, nicotine dependence, GERD, hepatic steatosis, diverticulosis, who presents for chief concerns of failure to thrive, sitting in a chair for 1 month, swelling in his lower extremities. ? ?Pt admitted with FTT.   ? ?Reviewed I/O's: +360 ml x 24 hours and +650 ml since admission ? ?Spoke with pt and daughter at bedside. Pt reports a general decline over the past 3 months. Pt complains of lower extremity weakness and inability to walk. Pt shares it has been progressively harder for him to stand and prepare foods. Over the past few weeks, pt has been consuming about one meal per day (cheeseburger brought to him by his niece).  ? ?Since being admitted to the hospital, intake has improved. Noted meal completions 25-100%. Per daughter, pt consumed almost all of his breakfast and an entire Ensure. ? ?Per pt, his UBW is around 165#; he estimates a 10# wt loss over the past 3 months. Reviewed wt hx; wt has been stable over the past year.  ? ?Discussed importance of good meal and supplement intake to promote healing. Pt amenable to Ensure supplements. RD will also liberalize diet for wider  variety of meal selections.  ? ?Medications reviewed and include folic acid and thiamine.  ? ?Labs reviewed.  ? ?NUTRITION - FOCUSED PHYSICAL EXAM: ? ?Flowsheet Row Most Recent Value  ?Orbital Region Severe depletion  ?Upper Arm Region Moderate depletion  ?Thoracic and Lumbar Region Severe depletion  ?Buccal Region Severe depletion  ?Temple Region Severe depletion  ?Clavicle Bone Region Moderate depletion  ?Clavicle and Acromion Bone Region Severe depletion  ?Scapular Bone Region Severe depletion  ?Dorsal Hand Moderate depletion  ?Patellar Region Mild depletion  ?Anterior Thigh Region Mild depletion  ?Posterior Calf Region Mild depletion  ?Edema (RD Assessment) None  ?Hair Reviewed  ?Eyes Reviewed  ?Mouth Reviewed  ?Skin Reviewed  ?Nails Reviewed  ? ?  ? ? ?Diet Order:   ?Diet Order   ? ?       ?  Diet regular Room service appropriate? Yes; Fluid consistency: Thin  Diet effective now       ?  ? ?  ?  ? ?  ? ? ?EDUCATION NEEDS:  ? ?Education needs have been addressed ? ?Skin:  Skin Assessment: Reviewed RN Assessment ? ?Last BM:  Unknown ? ?Height:  ? ?Ht Readings from Last 1 Encounters:  ?08/26/21 5\' 10"  (1.778 m)  ? ? ?Weight:  ? ?Wt Readings from Last 1 Encounters:  ?08/26/21 71 kg  ? ? ?Ideal Body Weight:  75.5 kg ? ?BMI:  Body mass index is 22.46 kg/m?. ? ?Estimated Nutritional Needs:  ? ?Kcal:  2250-2450 ? ?Protein:  125-140 grams ? ?Fluid:  >  2 L ? ? ? ?Levada Schilling, RD, LDN, CDCES ?Registered Dietitian II ?Certified Diabetes Care and Education Specialist ?Please refer to [facility] for RD and/or RD on-call/weekend/after hours pager  ?

## 2021-08-31 DIAGNOSIS — F10230 Alcohol dependence with withdrawal, uncomplicated: Secondary | ICD-10-CM | POA: Diagnosis not present

## 2021-08-31 DIAGNOSIS — G8929 Other chronic pain: Secondary | ICD-10-CM | POA: Diagnosis present

## 2021-08-31 LAB — LACTIC ACID, PLASMA: Lactic Acid, Venous: 1.1 mmol/L (ref 0.5–1.9)

## 2021-08-31 LAB — CULTURE, BLOOD (ROUTINE X 2): Culture: NO GROWTH

## 2021-08-31 MED ORDER — IBUPROFEN 400 MG PO TABS
400.0000 mg | ORAL_TABLET | Freq: Four times a day (QID) | ORAL | Status: DC | PRN
  Administered 2021-09-01 – 2021-09-07 (×2): 400 mg via ORAL
  Filled 2021-08-31 (×2): qty 1

## 2021-08-31 NOTE — Plan of Care (Signed)
?  Problem: Clinical Measurements: ?Goal: Ability to maintain clinical measurements within normal limits will improve ?Outcome: Progressing ?  ?Problem: Activity: ?Goal: Risk for activity intolerance will decrease ?Outcome: Progressing ?  ?Problem: Elimination: ?Goal: Will not experience complications related to urinary retention ?Outcome: Progressing ?  ?Problem: Safety: ?Goal: Ability to remain free from injury will improve ?Outcome: Progressing ?  ?Problem: Skin Integrity: ?Goal: Risk for impaired skin integrity will decrease ?Outcome: Progressing ?  ?

## 2021-08-31 NOTE — Assessment & Plan Note (Signed)
--  no current Rx for opioids ?--Tylenol 1g TID PRN ?--Advil PRN ?--will not start opioids ?

## 2021-08-31 NOTE — Progress Notes (Signed)
Daughter - Willy Eddy, [phone] ?

## 2021-08-31 NOTE — Progress Notes (Signed)
?  Progress Note ? ? ?Patient: Chad [name] OEV:[id number] DOB: Jul 02, 1950 DOA: 08/26/2021     4 ?DOS: the patient was seen and examined on 08/31/2021 ?  ?Brief hospital course: ?[name] is a 71 year old male with history of alcohol abuse, hypertension, depression, anxiety, history of peptic ulcer disease, nicotine dependence, GERD, hepatic steatosis, diverticulosis, who presents to the emergency department for chief concerns of failure to thrive, sitting in a chair for 1 month, swelling in his lower extremities. ? ?Initial vitals in the emergency department showed temperature of 98.9, respiration rate of 17, heart rate of 96, blood pressure 127/82, SPO2 of 98% on room air. ? ?Serum sodium 134, potassium 5.0, chloride 100, bicarb 25, BUN less than 5, serum creatinine of 0.80, nonfasting blood glucose 99, GFR greater than 60.  WBC 5.9, hemoglobin 12.8, platelets of 228. GFR greater than 60. ? ?CK is 58. COVID/influenza A/influenza B PCR were negative. ? ?Lactic acid is 3.5. ? ?ED treatment: Ativan 2 mg IV twice, thiamine 100 mg IV, LR 150 mL/h, sodium chloride 1 L bolus x2 with sodium chloride 250 mL bolus. ? ?Assessment and Plan: ?Anxiety and depression ?- cont trazodone 150 mg nightly, venlafaxine to 25 mg p.o. daily before breakfast, mirtazapine 30 mg nightly ?- Hydroxyzine 25 mg every 6 hours as needed for itching, anxiety ? ?Alcohol dependence with uncomplicated withdrawal (HCC) ?--detoxed while inpatient. ? ?Chronic back pain ?--no current Rx for opioids ?--Tylenol 1g TID PRN ?--Advil PRN ?--will not start opioids ? ?Adult failure to thrive ?--from alcohol abuse, now is dependent on ADL's. ? ? ?Contact dermatitis ?Sacral ulcer, ruled out ?--contact dermatitis from exposure from his stool and urine ?--wound care consulted ?--barrier cream ? ?Dysuria ?-urine cx with multiple species present, suggesting chronic colonization.  ?--did not start on abx ? ?Essential hypertension ?-Patient has not been eating or  taking his medications for at least 1 month ?- cont Metoprolol succinate 25 mg daily  ? ? ?Elevated lactic acid level ?--trend ? ?Major depressive disorder, recurrent severe without psychotic features (HCC) ?--cont Remeron, Effexor ? ? ? ? ?  ? ?Subjective:  ?Pt complained of chronic back pain. ? ? ?Physical Exam: ? ?Constitutional: NAD, sleeping ?CV: No cyanosis.   ?RESP: normal respiratory effort, on RA ?SKIN: warm, dry ? ? ?Data Reviewed: ? ?Family Communication:  ?Disposition: ?Status is: Inpatient ? ? Planned Discharge Destination: Skilled nursing facility ? ? ? ?Time spent: 25 minutes ? ?Author: ?Darlin Priestly, MD ?08/31/2021 4:55 PM ? ?For on call review [email or web address]  ?

## 2021-09-01 DIAGNOSIS — R627 Adult failure to thrive: Secondary | ICD-10-CM | POA: Diagnosis not present

## 2021-09-01 NOTE — TOC Progression Note (Signed)
Transition of Care (TOC) - Progression Note  ? ? ?Patient Details  ?Name: Chad [name] ?MRN: [id number] ?Date of Birth: 23-Dec-1950 ? ?Transition of Care (TOC) CM/SW Contact  ?Bellah Alia, LCSW ?Phone Number: ?09/01/2021, 12:45 PM ? ?Clinical Narrative:    ? ?CSW spoke with patient's daughter Maeola Sarah [phone] and updated her on placement search. No bed offers. ? ?Expected Discharge Plan: [place] ?Barriers to Discharge: Continued Medical Work up ? ?Expected Discharge Plan and Services ?Expected Discharge Plan: [place] ?  ?Discharge Planning Services: CM Consult ?Post Acute Care Choice: [place] ?Living arrangements for the past 2 months: Apartment ?                ?DME Arranged: N/A ?DME Agency: NA ?  ?  ?  ?HH Arranged: NA ?[place] Agency: NA ?  ?  ?  ? ? ?Social Determinants of Health (SDOH) Interventions ?  ? ?Readmission Risk Interventions ? ?  01/26/2021  ?  4:20 PM  ?Readmission Risk Prevention Plan  ?Transportation Screening Complete  ?PCP or Specialist Appt within 3-5 Days Complete  ?[place] or Home Care Consult Complete  ?Social Work Consult for [place] Planning/Counseling Complete  ?Palliative Care Screening Not Applicable  ?Medication Review Press photographer) Complete  ? ? ?

## 2021-09-01 NOTE — Progress Notes (Signed)
?  Progress Note ? ? ?Patient: Chad [name] SNK:[id number] DOB: [birth year] DOA: 08/26/2021     5 ?DOS: the patient was seen and examined on 09/01/2021 ?  ?Brief hospital course: ?[name] is a 71 year old male with history of alcohol abuse, hypertension, depression, anxiety, history of peptic ulcer disease, nicotine dependence, GERD, hepatic steatosis, diverticulosis, who presents to the emergency department for chief concerns of failure to thrive, sitting in a chair for 1 month, swelling in his lower extremities. ? ?Initial vitals in the emergency department showed temperature of 98.9, respiration rate of 17, heart rate of 96, blood pressure 127/82, SPO2 of 98% on room air. ? ?Serum sodium 134, potassium 5.0, chloride 100, bicarb 25, BUN less than 5, serum creatinine of 0.80, nonfasting blood glucose 99, GFR greater than 60.  WBC 5.9, hemoglobin 12.8, platelets of 228. GFR greater than 60. ? ?CK is 58. COVID/influenza A/influenza B PCR were negative. ? ?Lactic acid is 3.5. ? ?ED treatment: Ativan 2 mg IV twice, thiamine 100 mg IV, LR 150 mL/h, sodium chloride 1 L bolus x2 with sodium chloride 250 mL bolus. ? ?Assessment and Plan: ?Anxiety and depression ?- cont trazodone 150 mg nightly, venlafaxine to 25 mg p.o. daily before breakfast, mirtazapine 30 mg nightly ?- Hydroxyzine 25 mg every 6 hours as needed for itching, anxiety ? ?Alcohol dependence with uncomplicated withdrawal (HCC) ?--detoxed while inpatient. ? ?Chronic back pain ?--no current Rx for opioids ?--Tylenol 1g TID PRN ?--Advil PRN ?--will not start opioids ? ?Adult failure to thrive ?--from alcohol abuse, now is dependent on ADL's. ? ? ?Contact dermatitis ?Sacral ulcer, ruled out ?--contact dermatitis from exposure from his stool and urine ?--wound care consulted ?--barrier cream ? ?Dysuria ?-urine cx with multiple species present, suggesting chronic colonization.  ?--did not start on abx ? ?Essential hypertension ?-Patient has not been eating or  taking his medications for at least 1 month ?- cont Metoprolol succinate 25 mg daily  ? ? ?Elevated lactic acid level ?--trend ? ?Major depressive disorder, recurrent severe without psychotic features (HCC) ?--cont Remeron, Effexor ? ? ? ? ?  ? ?Subjective:  ?Both daughter and niece were visiting today.  Pt was in a much better mood, more interactive, and more polite today.  Pt said he felt much better. ? ? ?Physical Exam: ? ?Constitutional: NAD, AAOx3 ?HEENT: conjunctivae and lids normal, EOMI ?CV: No cyanosis.   ?RESP: normal respiratory effort, on RA ?SKIN: warm, dry ?Neuro: II - XII grossly intact.   ?Psych: much better mood and affect.   ? ? ?Data Reviewed: ? ?Family Communication: daughter and niece updated at bedside today ?Disposition: ?Status is: Inpatient ? ? Planned Discharge Destination: Skilled nursing facility ? ? ? ?Time spent: 25 minutes ? ?Author: ?Darlin Priestly, MD ?09/01/2021 6:25 PM ? ?For on call review [email or web address]  ?

## 2021-09-02 DIAGNOSIS — R627 Adult failure to thrive: Secondary | ICD-10-CM | POA: Diagnosis not present

## 2021-09-02 NOTE — TOC Progression Note (Signed)
Transition of Care (TOC) - Progression Note  ? ? ?Patient Details  ?Name: Chad [name] ?MRN: [id number] ?Date of Birth: [birth year] ? ?Transition of Care (TOC) CM/SW Contact  ?Alberteen Sam, LCSW ?Phone Number: ?09/02/2021, 3:57 PM ? ?Clinical Narrative:    ? ?CSW notes patient family prefers only [place] [facility] facilities.  ? ?CSW called [facility] at [phone] and lvm with kristina in admissions.  ? ?CSW called [facility] at [phone] spoke with Estill Bamberg with admissions who reports [facility], patient's insurance is out of network.  ? ?CSW called [facility] at [phone] they report they do take [employer] and request clinicals be faxed to [phone]. CSW has faxed clinicals.  ? ? ? ?Expected Discharge Plan: [place] ?Barriers to Discharge: Continued Medical Work up ? ?Expected Discharge Plan and Services ?Expected Discharge Plan: [place] ?  ?Discharge Planning Services: CM Consult ?Post Acute Care Choice: [place] ?Living arrangements for the past 2 months: Apartment ?                ?DME Arranged: N/A ?DME Agency: NA ?  ?  ?  ?HH Arranged: NA ?[place] Agency: NA ?  ?  ?  ? ? ?Social Determinants of Health (SDOH) Interventions ?  ? ?Readmission Risk Interventions ? ?  01/26/2021  ?  4:20 PM  ?Readmission Risk Prevention Plan  ?Transportation Screening Complete  ?PCP or Specialist Appt within 3-5 Days Complete  ?[place] or Home Care Consult Complete  ?Social Work Consult for [place] Planning/Counseling Complete  ?Palliative Care Screening Not Applicable  ?Medication Review Press photographer) Complete  ? ? ?

## 2021-09-02 NOTE — Progress Notes (Signed)
?  Progress Note ? ? ?Patient: Chad [name] SNK:[id number] DOB: January 14, 1951 DOA: 08/26/2021     6 ?DOS: the patient was seen and examined on 09/02/2021 ?  ?Brief hospital course: ?[name] is a 71 year old male with history of alcohol abuse, hypertension, depression, anxiety, history of peptic ulcer disease, nicotine dependence, GERD, hepatic steatosis, diverticulosis, who presents to the emergency department for chief concerns of failure to thrive, sitting in a chair for 1 month, swelling in his lower extremities. ? ?Initial vitals in the emergency department showed temperature of 98.9, respiration rate of 17, heart rate of 96, blood pressure 127/82, SPO2 of 98% on room air. ? ?Serum sodium 134, potassium 5.0, chloride 100, bicarb 25, BUN less than 5, serum creatinine of 0.80, nonfasting blood glucose 99, GFR greater than 60.  WBC 5.9, hemoglobin 12.8, platelets of 228. GFR greater than 60. ? ?CK is 58. COVID/influenza A/influenza B PCR were negative. ? ?Lactic acid is 3.5. ? ?ED treatment: Ativan 2 mg IV twice, thiamine 100 mg IV, LR 150 mL/h, sodium chloride 1 L bolus x2 with sodium chloride 250 mL bolus. ? ?Assessment and Plan: ?Anxiety and depression ?- cont trazodone 150 mg nightly, venlafaxine to 225 mg p.o. daily, mirtazapine 30 mg nightly ?- Hydroxyzine 25 mg every 6 hours as needed for itching, anxiety ? ?Alcohol dependence with uncomplicated withdrawal (HCC) ?--detoxed while inpatient. ? ?Chronic back pain ?--no current Rx for opioids ?--Tylenol 1g TID PRN ?--Advil PRN ?--will not start opioids ? ?Protein-calorie malnutrition, severe ?--supplements per dietician ? ?Adult failure to thrive ?--from alcohol abuse, now is dependent on ADL's. ? ? ?Contact dermatitis ?Sacral ulcer, ruled out ?--contact dermatitis from exposure from his stool and urine ?--wound care consulted ?--barrier cream ? ?Dysuria ?-urine cx with multiple species present, suggesting chronic colonization.  ?--did not start on  abx ? ?Essential hypertension ?-Patient has not been eating or taking his medications for at least 1 month ?- cont Metoprolol succinate 25 mg daily  ? ? ?Elevated lactic acid level ?--trend ? ? ? ? ?  ? ?Subjective:  ?Pt complained of chronic back pain. ? ? ?Physical Exam: ? ?Constitutional: NAD, sleeping ?CV: No cyanosis.   ?RESP: normal respiratory effort, on RA ?SKIN: warm, dry ? ? ?Data Reviewed: ? ?Family Communication:  ?Disposition: ?Status is: Inpatient ? ? Planned Discharge Destination: Skilled nursing facility ? ? ? ?Time spent: 25 minutes ? ?Author: ?Darlin Priestly, MD ?09/02/2021 4:50 PM ? ?For on call review [email or web address]  ?

## 2021-09-02 NOTE — Assessment & Plan Note (Addendum)
Nutrition Status: ?Nutrition Problem: Severe Malnutrition ?Etiology: chronic illness (ETOH abuse) ?Signs/Symptoms: energy intake < or equal to 75% for > or equal to 1 month, moderate fat depletion, severe fat depletion, moderate muscle depletion, severe muscle depletion ?Interventions: Ensure Enlive (each supplement provides 350kcal and 20 grams of protein), MVI, Liberalize Diet ? ? ? ?

## 2021-09-02 NOTE — Progress Notes (Signed)
Physical Therapy Treatment ?Patient Details ?Name: Chad [name] ?MRN: [id number] ?DOB: [birth year] ?Today's Date: 09/02/2021 ? ? ?History of Present Illness 71 year old male with history of alcohol abuse, hypertension, depression, anxiety, history of peptic ulcer disease, nicotine dependence, GERD, hepatic steatosis, diverticulosis, who presents to the emergency department/admitted for chief concerns of failure to thrive, sitting in a chair for 1 month, swelling in his lower extremities ? ?  ?PT Comments  ? ? Patient agreeable to PT treatment. He was able to sit up on the edge of bed for ~ 5 minutes with fair sitting balance. Lateral scoot transfer performed while sitting with moderate assistance and cues for technique. He declined attempting to stand this session due to fatigue with activity. The patient continues to require assistance with  bed mobility also. Recommend to continue PT to maximize independence and decrease caregiver burden. SNF recommended at discharge.  ?  ?Recommendations for follow up therapy are one component of a multi-disciplinary discharge planning process, led by the attending physician.  Recommendations may be updated based on patient status, additional functional criteria and insurance authorization. ? ?Follow Up Recommendations ? Skilled nursing-short term rehab (<3 hours/day) ?  ?  ?Assistance Recommended at Discharge Frequent or constant Supervision/Assistance  ?Patient can return home with the following A lot of help with walking and/or transfers;A lot of help with bathing/dressing/bathroom ?  ?Equipment Recommendations ? None recommended by PT  ?  ?Recommendations for Other Services   ? ? ?  ?Precautions / Restrictions Precautions ?Precautions: Fall ?Restrictions ?Weight Bearing Restrictions: No  ?  ? ?Mobility ? Bed Mobility ?Overal bed mobility: Needs Assistance ?Bed Mobility: Supine to Sit, Sit to Supine ?  ?  ?Supine to sit: Mod assist, HOB elevated ?Sit to supine: Mod assist ?   ?General bed mobility comments: assistance for trunk support to sit upright and assistance for LE support to return to bed. increased time and effort required ?  ? ?Transfers ?Overall transfer level: Needs assistance ?  ?  ?  ?  ?  ?  ?  ? Lateral/Scoot Transfers: Mod assist ?General transfer comment: 2 lateral scoot transfers performed in sitting position. verbal cues for anterior weight shifting and technique to faciliate independence. he declined attempting to stand this session. he appears fatigued with minimal activity ?  ? ?Ambulation/Gait ?  ?  ?  ?  ?  ?  ?  ?  ? ? ?Stairs ?  ?  ?  ?  ?  ? ? ?Wheelchair Mobility ?  ? ?Modified Rankin (Stroke Patients Only) ?  ? ? ?  ?Balance Overall balance assessment: Needs assistance ?Sitting-balance support: Bilateral upper extremity supported, No upper extremity supported ?Sitting balance-Leahy Scale: Good ?Sitting balance - Comments: no physical assistance required. cues for upright sitting posture. stand by assistance for safety. sitting tolerance limited to less than 5 minutes due to chronic pain and wanting to return to bed ?  ?  ?  ?  ?  ?  ?  ?  ?  ?  ?  ?  ?  ?  ?  ?  ? ?  ?Cognition Arousal/Alertness: Awake/alert ?Behavior During Therapy: Flat affect ?Overall Cognitive Status: No family/caregiver present to determine baseline cognitive functioning ?  ?  ?  ?  ?  ?  ?  ?  ?  ?  ?  ?  ?  ?  ?  ?  ?General Comments: patient is able to follow single step commands consistently with extra time ?  ?  ? ?  ?  Exercises Other Exercises ?Other Exercises: offered LE exercises, however patient declined due to fatigue ? ?  ?General Comments   ?  ?  ? ?Pertinent Vitals/Pain Pain Assessment ?Pain Assessment: Faces ?Faces Pain Scale: Hurts little more ?Pain Location: chronic back pain ?Pain Descriptors / Indicators: Constant ?Pain Intervention(s): Limited activity within patient's tolerance, Monitored during session, Repositioned  ? ? ?Home Living   ?  ?  ?  ?  ?  ?  ?  ?  ?  ?    ?  ?Prior Function    ?  ?  ?   ? ?PT Goals (current goals can now be found in the care plan section) Acute Rehab PT Goals ?Patient Stated Goal: to rest in the bed ?PT Goal Formulation: With patient ?Time For Goal Achievement: 09/10/21 ?Potential to Achieve Goals: Fair ?Progress towards PT goals: Progressing toward goals ? ?  ?Frequency ? ? ? Min 2X/week ? ? ? ?  ?PT Plan Current plan remains appropriate  ? ? ?Co-evaluation   ?  ?  ?  ?  ? ?  ?AM-PAC PT "6 Clicks" Mobility   ?Outcome Measure ? Help needed turning from your back to your side while in a flat bed without using bedrails?: A Lot ?Help needed moving from lying on your back to sitting on the side of a flat bed without using bedrails?: A Lot ?Help needed moving to and from a bed to a chair (including a wheelchair)?: Total ?Help needed standing up from a chair using your arms (e.g., wheelchair or bedside chair)?: Total ?Help needed to walk in hospital room?: Total ?Help needed climbing 3-5 steps with a railing? : Total ?6 Click Score: 8 ? ?  ?End of Session   ?Activity Tolerance: Patient tolerated treatment well ?Patient left: in bed;with call bell/phone within reach;with bed alarm set ?  ?PT Visit Diagnosis: Muscle weakness (generalized) (M62.81);Difficulty in walking, not elsewhere classified (R26.2) ?  ? ? ?Time: 4235-3614 ?PT Time Calculation (min) (ACUTE ONLY): 9 min ? ?Charges:  $Therapeutic Activity: 8-22 mins          ?          ? ?Donna Bernard, PT, MPT ? ? ? ?Chad [name] ?09/02/2021, 12:04 PM ? ?

## 2021-09-03 DIAGNOSIS — R627 Adult failure to thrive: Secondary | ICD-10-CM | POA: Diagnosis not present

## 2021-09-03 NOTE — TOC Progression Note (Signed)
Transition of Care (TOC) - Progression Note  ? ? ?Patient Details  ?Name: Chad [name] ?MRN: [id number] ?Date of Birth: 01/11/1951 ? ?Transition of Care (TOC) CM/SW Contact  ?Caryn Section, RN ?Phone Number: ?09/03/2021, 4:07 PM ? ?Clinical Narrative:   Spoke with daughter and TOC supervisor.  As per [facility] supervisor patient will be placed in [place] at daughter's request.  [facility] faxed to [phone].  They will respond with bed decision tomorrow.  RNCM faxed [phone], awaiting response. ? ? ? ?Expected Discharge Plan: Skilled Nursing Facility ?Barriers to Discharge: Continued Medical Work up ? ?Expected Discharge Plan and Services ?Expected Discharge Plan: Skilled Nursing Facility ?  ?Discharge Planning Services: CM Consult ?Post Acute Care Choice: Skilled Nursing Facility ?Living arrangements for the past 2 months: Apartment ?                ?DME Arranged: N/A ?DME Agency: NA ?  ?  ?  ?HH Arranged: NA ?HH Agency: NA ?  ?  ?  ? ? ?Social Determinants of Health (SDOH) Interventions ?  ? ?Readmission Risk Interventions ? ?  01/26/2021  ?  4:20 PM  ?Readmission Risk Prevention Plan  ?Transportation Screening Complete  ?PCP or Specialist Appt within 3-5 Days Complete  ?[place] or Home Care Consult Complete  ?Social Work Consult for [place] Care Planning/Counseling Complete  ?Palliative Care Screening Not Applicable  ?Medication Review Oceanographer) Complete  ? ? ?

## 2021-09-03 NOTE — TOC Progression Note (Signed)
Transition of Care (TOC) - Progression Note  ? ? ?Patient Details  ?Name: Chad [name] ?MRN: [id number] ?Date of Birth: 09-Feb-1951 ? ?Transition of Care (TOC) CM/SW Contact  ?[place] Cellar, RN ?Phone Number: ?09/03/2021, 3:17 PM ? ?Clinical Narrative:    ?Spoke to daughter who was following up on previous request for SNF placement in [place]. Confirmed patient was being reviewed by requested SNF in [place] at this time. Daughter very appreciative of assistance and reports she is working to assist patient after discharge with lifestyle changes. Daughter reports his current apartment is being condemned due to his behaviors.   ? ? ? ?Expected Discharge Plan: Skilled Nursing Facility ?Barriers to Discharge: Continued Medical Work up ? ?Expected Discharge Plan and Services ?Expected Discharge Plan: Skilled Nursing Facility ?  ?Discharge Planning Services: CM Consult ?Post Acute Care Choice: Skilled Nursing Facility ?Living arrangements for the past 2 months: Apartment ?                ?DME Arranged: N/A ?DME Agency: NA ?  ?  ?  ?HH Arranged: NA ?HH Agency: NA ?  ?  ?  ? ? ?Social Determinants of Health (SDOH) Interventions ?  ? ?Readmission Risk Interventions ? ?  01/26/2021  ?  4:20 PM  ?Readmission Risk Prevention Plan  ?Transportation Screening Complete  ?PCP or Specialist Appt within 3-5 Days Complete  ?[place] or Home Care Consult Complete  ?Social Work Consult for Recovery Care Planning/Counseling Complete  ?Palliative Care Screening Not Applicable  ?Medication Review Oceanographer) Complete  ? ? ?

## 2021-09-03 NOTE — Progress Notes (Addendum)
Physical Therapy Treatment ?Patient Details ?Name: Chad [name] ?MRN: [id number] ?DOB: [birth year] ?Today's Date: 09/03/2021 ? ? ?History of Present Illness 71 year old male with history of alcohol abuse, hypertension, depression, anxiety, history of peptic ulcer disease, nicotine dependence, GERD, hepatic steatosis, diverticulosis, who presents to the emergency department/admitted for chief concerns of failure to thrive, sitting in a chair for 1 month, swelling in his lower extremities ? ?  ?PT Comments  ? ? Patient was agreeable to PT session. He continues to require assistance with bed mobility with cues for sequencing. Left lateral lean in sitting position that is more pronounced with increased sitting time. Faciliation for midline posture provided. He performed incremental scooting along edge of bed for better positioning, strengthening,  and in preparation for sit to stand transfers. He is fatigued with minimal activity. Recommend to continue PT with SNF placement at discharge.  ?  ?Recommendations for follow up therapy are one component of a multi-disciplinary discharge planning process, led by the attending physician.  Recommendations may be updated based on patient status, additional functional criteria and insurance authorization. ? ?Follow Up Recommendations ? Skilled nursing-short term rehab (<3 hours/day) ?  ?  ?Assistance Recommended at Discharge Frequent or constant Supervision/Assistance  ?Patient can return home with the following A lot of help with walking and/or transfers;A lot of help with bathing/dressing/bathroom ?  ?Equipment Recommendations ? None recommended by PT  ?  ?Recommendations for Other Services   ? ? ?  ?Precautions / Restrictions Precautions ?Precautions: Fall ?Restrictions ?Weight Bearing Restrictions: No  ?  ? ?Mobility ? Bed Mobility ?Overal bed mobility: Needs Assistance ?Bed Mobility: Supine to Sit, Sit to Supine ?  ?  ?Supine to sit: Mod assist, HOB elevated ?Sit to supine: Mod  assist ?  ?General bed mobility comments: assistance for trunk support to sit upright and assistance for LE support to return to bed. increased time and effort required. cues for sequencing and technique ?  ? ?Transfers ?Overall transfer level: Needs assistance ?  ?  ?  ?  ?  ?  ?  ? Lateral/Scoot Transfers: Mod assist ?General transfer comment: patient performed incremental scoot transfer to the right with verbal cues for foot placement, hand positioning, and anterior weight shifting. 4 bouts performed today. he declined standing attempts ?  ? ?Ambulation/Gait ?  ?  ?  ?  ?  ?  ?  ?  ? ? ?Stairs ?  ?  ?  ?  ?  ? ? ?Wheelchair Mobility ?  ? ?Modified Rankin (Stroke Patients Only) ?  ? ? ?  ?Balance   ?Sitting-balance support: Single extremity supported, Feet supported ?Sitting balance-Leahy Scale: Poor (fair to poor) ?Sitting balance - Comments: patient has a left lateral lean that is more pronounced with increased sitting time. faciliation, cues, and visual feedback using mirror to maintain midline sitting balance. ?Postural control: Left lateral lean ?  ?  ?  ?  ?  ?  ?  ?  ?  ?  ?  ?  ?  ?  ?  ? ?  ?Cognition Arousal/Alertness: Awake/alert ?Behavior During Therapy: Flat affect ?Overall Cognitive Status: No family/caregiver present to determine baseline cognitive functioning ?  ?  ?  ?  ?  ?  ?  ?  ?  ?  ?  ?  ?  ?  ?  ?  ?General Comments: increased time required for following commands ?  ?  ? ?  ?Exercises   ? ?  ?General  Comments   ?  ?  ? ?Pertinent Vitals/Pain Pain Assessment ?Pain Assessment: 0-10 ?Faces Pain Scale: Hurts little more ?Pain Location: chronic back pain ?Pain Descriptors / Indicators: Constant ?Pain Intervention(s): Repositioned, Monitored during session, Limited activity within patient's tolerance  ? ? ?Home Living   ?  ?  ?  ?  ?  ?  ?  ?  ?  ?   ?  ?Prior Function    ?  ?  ?   ? ?PT Goals (current goals can now be found in the care plan section) Acute Rehab PT Goals ?Patient Stated Goal: to  rest ?PT Goal Formulation: With patient ?Time For Goal Achievement: 09/10/21 ?Potential to Achieve Goals: Fair ?Progress towards PT goals: Progressing toward goals ? ?  ?Frequency ? ? ? Min 2X/week ? ? ? ?  ?PT Plan Current plan remains appropriate  ? ? ?Co-evaluation   ?  ?  ?  ?  ? ?  ?AM-PAC PT "6 Clicks" Mobility   ?Outcome Measure ? Help needed turning from your back to your side while in a flat bed without using bedrails?: A Lot ?Help needed moving from lying on your back to sitting on the side of a flat bed without using bedrails?: A Lot ?Help needed moving to and from a bed to a chair (including a wheelchair)?: Total ?Help needed standing up from a chair using your arms (e.g., wheelchair or bedside chair)?: Total ?Help needed to walk in hospital room?: Total ?Help needed climbing 3-5 steps with a railing? : Total ?6 Click Score: 8 ? ?  ?End of Session   ?Activity Tolerance: Patient limited by fatigue ?Patient left: in bed;with call bell/phone within reach;with bed alarm set ?  ?PT Visit Diagnosis: Muscle weakness (generalized) (M62.81);Difficulty in walking, not elsewhere classified (R26.2) ?  ? ? ?Time: 1610-9604 ?PT Time Calculation (min) (ACUTE ONLY): 11 min ? ?Charges:  $Therapeutic Activity: 8-22 mins          ?          ? ?Donna Bernard, PT, MPT ? ? ? ?[name] ?09/03/2021, 12:25 PM ? ?

## 2021-09-03 NOTE — Progress Notes (Signed)
Occupational Therapy Treatment ?Patient Details ?Name: Chad [name] ?MRN: [id number] ?DOB: March 14, 1951 ?Today's Date: 09/03/2021 ? ? ?History of present illness 71 year old male with history of alcohol abuse, hypertension, depression, anxiety, history of peptic ulcer disease, nicotine dependence, GERD, hepatic steatosis, diverticulosis, who presents to the emergency department/admitted for chief concerns of failure to thrive, sitting in a chair for 1 month, swelling in his lower extremities ?  ?OT comments ? Upon entering the room, pt supine in bed but in an awkward position. Pt reporting 12/10 back pain. He declines OOB and EOB activities but requesting assistance for repositioning. From flat bed put able to push with B LEs towards [facility] for repositioning. Pt washing face and hands with set up A to obtain needed items and changing soiled hospital gown with set up A. Pt declined further activity and remains supine at end of session. All needs within reach and bed alarm activated.  ? ?Recommendations for follow up therapy are one component of a multi-disciplinary discharge planning process, led by the attending physician.  Recommendations may be updated based on patient status, additional functional criteria and insurance authorization. ?   ?Follow Up Recommendations ? Skilled nursing-short term rehab (<3 hours/day)  ?  ?Assistance Recommended at Discharge Frequent or constant Supervision/Assistance  ?Patient can return home with the following ? A little help with walking and/or transfers;A little help with bathing/dressing/bathroom;Help with stairs or ramp for entrance;Assist for transportation ?  ?Equipment Recommendations ? Other (comment) (defer to next venue of care)  ?  ?   ?Precautions / Restrictions Precautions ?Precautions: Fall ?Restrictions ?Weight Bearing Restrictions: No  ? ? ?  ? ?Mobility Bed Mobility ?Overal bed mobility: Needs Assistance ?Bed Mobility: Rolling ?Rolling: Min assist ?  ?  ?  ?  ?  ?   ? ?Transfers ?  ?  ?  ?  ?  ?  ?  ?  ?  ?General transfer comment: Pt refused ?  ?  ?   ? ?ADL either performed or assessed with clinical judgement  ? ?ADL Overall ADL's : Needs assistance/impaired ?  ?  ?Grooming: Dance movement psychotherapist;Wash/dry hands;Set up;Supervision/safety ?  ?  ?  ?  ?  ?Upper Body Dressing : Set up;Supervision/safety ?  ?  ?  ?  ?  ?  ?  ?  ?  ?  ?  ?  ? ?Extremity/Trunk Assessment Upper Extremity Assessment ?Upper Extremity Assessment: Generalized weakness ?  ?Lower Extremity Assessment ?Lower Extremity Assessment: Generalized weakness ?  ?  ?  ? ?Vision Patient Visual Report: No change from baseline ?  ?  ?   ?   ? ?Cognition Arousal/Alertness: Awake/alert ?Behavior During Therapy: Flat affect ?Overall Cognitive Status: No family/caregiver present to determine baseline cognitive functioning ?  ?  ?  ?  ?  ?  ?  ?  ?  ?  ?  ?  ?  ?  ?  ?  ?  ?  ?  ?   ?   ?   ?   ? ? ?Pertinent Vitals/ Pain       Pain Assessment ?Pain Assessment: 0-10 ?Pain Score: 10-Worst pain ever ?Pain Location: chronic back pain ?Pain Descriptors / Indicators: Constant ?Pain Intervention(s): Monitored during session, Repositioned, Patient requesting pain meds-RN notified ? ?   ?   ? ?Frequency ? Min 2X/week  ? ? ? ? ?  ?Progress Toward Goals ? ?OT Goals(current goals can now be found in the care plan section) ? Progress  towards OT goals: Progressing toward goals ? ?Acute Rehab OT Goals ?Patient Stated Goal: to get stronger ?OT Goal Formulation: With patient ?Time For Goal Achievement: 09/13/21 ?Potential to Achieve Goals: Good  ?Plan Discharge plan remains appropriate;Frequency remains appropriate   ? ?   ?AM-PAC OT "6 Clicks" Daily Activity     ?Outcome Measure ? ? Help from another person eating meals?: None ?Help from another person taking care of personal grooming?: A Little ?Help from another person toileting, which includes using toliet, bedpan, or urinal?: A Lot ?Help from another person bathing (including washing,  rinsing, drying)?: A Lot ?Help from another person to put on and taking off regular upper body clothing?: A Little ?Help from another person to put on and taking off regular lower body clothing?: A Lot ?6 Click Score: 16 ? ?  ?End of Session   ? ?OT Visit Diagnosis: Unsteadiness on feet (R26.81);Muscle weakness (generalized) (M62.81);History of falling (Z91.81) ?  ?Activity Tolerance Patient limited by pain ?  ?Patient Left in bed;with call bell/phone within reach;with bed alarm set ?  ?Nurse Communication Mobility status ?  ? ?   ? ?Time: [id number] ?OT Time Calculation (min): 13 min ? ?Charges: OT General Charges ?$OT Visit: 1 Visit ?OT Treatments ?$Self Care/Home Management : 8-22 mins ? ?Darleen Crocker, [place], OTR/L , CBIS ?ascom [phone]  ?09/03/21, 4:42 PM  ?

## 2021-09-03 NOTE — Progress Notes (Addendum)
?  Progress Note ? ? ?Patient: Chad [name] NHA:[id number] DOB: April 16, 1951 DOA: 08/26/2021     7 ?DOS: the patient was seen and examined on 09/03/2021 ?  ?Brief hospital course: ?Chad [name] is a 71 year old male with history of alcohol abuse, hypertension, depression, anxiety, history of peptic ulcer disease, nicotine dependence, GERD, hepatic steatosis, diverticulosis, who presents to the emergency department for chief concerns of failure to thrive, sitting in a chair for 1 month, swelling in his lower extremities. ? ?Initial vitals in the emergency department showed temperature of 98.9, respiration rate of 17, heart rate of 96, blood pressure 127/82, SPO2 of 98% on room air. ? ?Serum sodium 134, potassium 5.0, chloride 100, bicarb 25, BUN less than 5, serum creatinine of 0.80, nonfasting blood glucose 99, GFR greater than 60.  WBC 5.9, hemoglobin 12.8, platelets of 228. GFR greater than 60. ? ?CK is 58. COVID/influenza A/influenza B PCR were negative. ? ?Lactic acid is 3.5. ? ?ED treatment: Ativan 2 mg IV twice, thiamine 100 mg IV, LR 150 mL/h, sodium chloride 1 L bolus x2 with sodium chloride 250 mL bolus. ? ?Assessment and Plan: ?Anxiety and depression ?- cont trazodone 150 mg nightly, venlafaxine to 225 mg p.o. daily, mirtazapine 30 mg nightly ?- Hydroxyzine 25 mg every 6 hours as needed for itching, anxiety ? ?Alcohol dependence with uncomplicated withdrawal (HCC) ?--detoxed while inpatient. ? ?Chronic back pain ?--no current Rx for opioids ?--Tylenol 1g TID PRN ?--Advil PRN ?--will not start opioids ? ?Protein-calorie malnutrition, severe ?--supplements per dietician ? ?Adult failure to thrive ?--from alcohol abuse, now is dependent on ADL's. ? ? ?Contact dermatitis ?Sacral ulcer, ruled out ?--contact dermatitis from exposure from his stool and urine ?--wound care consulted ?--barrier cream ? ?Dysuria ?-urine cx with multiple species present, suggesting chronic colonization.  ?--did not start on  abx ? ?Essential hypertension ?-Patient has not been eating or taking his medications for at least 1 month PTA ?- cont Metoprolol succinate 25 mg daily  ? ? ?Elevated lactic acid level ?--trend ? ? ? ? ?  ? ?Subjective:  ?Pt ate a whole meal on his own today. ? ? ?Physical Exam: ? ?Constitutional: NAD, AAOx3 ?HEENT: conjunctivae and lids normal, EOMI ?CV: No cyanosis.   ?RESP: normal respiratory effort, on RA ?Neuro: II - XII grossly intact.   ? ? ?Data Reviewed: ? ?Family Communication:  ?Disposition: ?Status is: Inpatient ? ? Planned Discharge Destination: Skilled nursing facility ? ? ? ?Time spent: 35 minutes ? ?Author: ?Darlin Priestly, MD ?09/03/2021 5:52 PM ? ?For on call review [email or web address].  ?

## 2021-09-04 DIAGNOSIS — I10 Essential (primary) hypertension: Secondary | ICD-10-CM

## 2021-09-04 DIAGNOSIS — R7989 Other specified abnormal findings of blood chemistry: Secondary | ICD-10-CM

## 2021-09-04 DIAGNOSIS — F10230 Alcohol dependence with withdrawal, uncomplicated: Secondary | ICD-10-CM | POA: Diagnosis not present

## 2021-09-04 DIAGNOSIS — R627 Adult failure to thrive: Secondary | ICD-10-CM | POA: Diagnosis not present

## 2021-09-04 NOTE — Progress Notes (Signed)
[facility] Progress Note ? ?Patient: Chad [name]    RKY:[id number]  DOA: 08/26/2021    ?Date of Service: the patient was seen and examined on 09/04/2021 ? ?Brief hospital course: ?Mr. Halley Kincer is a 71 year old male with history of alcohol abuse, hypertension, depression, anxiety, history of peptic ulcer disease, nicotine dependence, GERD, hepatic steatosis, diverticulosis, who presented to the emergency department on 5/8 with concerns of failure to thrive, sitting in a chair for 1 month, swelling in his lower extremities. ? ?Patient with long history of alcohol abuse and unable to care for himself.  He at times has been to skilled nursing facilities, but then checks himself out and goes back to resuming drinking.Patient had not been taking his home medications.  Over next few days after receiving medications for withdrawals and stabilization and hydrated, plan was for patient to go to skilled nursing again.  Patient's daughter reached out and request that patient go to a skilled nursing facility in [place], closer to her so that she can aid in his care and prevent recurring cycle of facility transfer and then leaving AMA. ? ?Assessment and Plan: ?Assessment and Plan: ?Anxiety and depression ?- cont trazodone 150 mg nightly, venlafaxine to 225 mg p.o. daily, mirtazapine 30 mg nightly ?- Hydroxyzine 25 mg every 6 hours as needed for itching, anxiety ? ?Alcohol dependence with uncomplicated withdrawal (HCC) ?--detoxed while inpatient. ? ?Chronic back pain ?--no current Rx for opioids ?--Tylenol 1g TID PRN ?--Advil PRN ?--will not start opioids ? ?Protein-calorie malnutrition, severe ?Nutrition Status: ?Nutrition Problem: Severe Malnutrition ?Etiology: chronic illness (ETOH abuse) ?Signs/Symptoms: energy intake < or equal to 75% for > or equal to 1 month, moderate fat depletion, severe fat depletion, moderate muscle depletion, severe muscle depletion ?Interventions: Ensure Enlive (each supplement provides  350kcal and 20 grams of protein), MVI, Liberalize Diet ? ? ? ? ?Adult failure to thrive ?--from alcohol abuse, now is dependent on ADL's. ? ? ?Contact dermatitis ?Sacral ulcer, ruled out ?--contact dermatitis from exposure from his stool and urine ?--wound care consulted ?--barrier cream ? ?Dysuria ?-urine cx with multiple species present, suggesting chronic colonization.  ?--did not start on abx ? ?Essential hypertension ?-Patient has not been eating or taking his medications for at least 1 month PTA ?- cont Metoprolol succinate 25 mg daily  ? ? ?Elevated lactic acid level ?Felt to be secondary to intravascular volume depletion from heavy drinking and poor p.o. intake.  Sepsis ruled out. ? ? ? ? ? ? ?Body mass index is 22.46 kg/m?[name]  ?Nutrition Problem: Severe Malnutrition ?Etiology: chronic illness (ETOH abuse) ?   ? ?Consultants: ?None  ? ?Procedures: ?None  ? ?Antimicrobials: ?None  ? ?Code Status: Full  ? ? ?Subjective: Patient with no complaints ? ?Objective: ?Vital signs were reviewed and unremarkable. ?Vitals:  ? 09/04/21 0449 09/04/21 0756  ?BP: 136/67 129/80  ?Pulse: 67 69  ?Resp: 16 18  ?Temp: (!) 97.3 ?F (36.3 ?C) 98 ?F (36.7 ?C)  ?SpO2: 97% 95%  ? ? ?Intake/Output Summary (Last 24 hours) at 09/04/2021 1502 ?Last data filed at 09/04/2021 1225 ?Gross per 24 hour  ?Intake 720 ml  ?Output 2600 ml  ?Net -1880 ml  ? ?Filed Weights  ? 08/26/21 1720  ?Weight: 71 kg  ? ?Body mass index is 22.46 kg/m?. ? ?Exam: ? ?General: Alert and oriented x2, no acute distress ?HEENT: Normocephalic atraumatic, mucous membranes moist ?Cardiovascular: Regular rate and rhythm, S1-S2 ?Respiratory: Clear to auscultation bilaterally ?Abdomen: Soft, nontender, nondistended, positive bowel sounds ?  Musculoskeletal: No clubbing or cyanosis, trace pitting edema ? ?Data Reviewed: ?There are no new results to review at this time. ? ?Disposition:  ?Status is: Inpatient ?Remains inpatient appropriate because: Waiting for skilled nursing  facility approval ?  ? ?Anticipated discharge date: Unknown, when skilled nursing bed available ? ? ?Family Communication: Left message for daughter ?DVT Prophylaxis: ?enoxaparin (LOVENOX) injection 40 mg Start: 08/26/21 2200 ?Place TED hose Start: 08/26/21 2103 ? ? ? ?Author: ?Hollice Espy ,MD ?09/04/2021 3:02 PM ? ?To reach On-call, see care teams to locate the attending and reach out via [email or web address] ?Between 7PM-7AM, please contact night-coverage ?If you still have difficulty reaching the attending provider, please page the [facility] (Director on Call) for [facility] on amion for assistance. ? ?

## 2021-09-04 NOTE — TOC Progression Note (Signed)
Transition of Care (TOC) - Progression Note  ? ? ?Patient Details  ?Name: Chad [name] ?MRN: [id number] ?Date of Birth: 22-Jun-1950 ? ?Transition of Care (TOC) CM/SW Contact  ?Candie Chroman, LCSW ?Phone Number: ?09/04/2021, 4:27 PM ? ?Clinical Narrative:  Faxed referral to [facility].  ? ?Expected Discharge Plan: [place] ?Barriers to Discharge: Continued Medical Work up ? ?Expected Discharge Plan and Services ?Expected Discharge Plan: [place] ?  ?Discharge Planning Services: CM Consult ?Post Acute Care Choice: [employer] ?Living arrangements for the past 2 months: Apartment ?                ?DME Arranged: N/A ?DME Agency: NA ?  ?  ?  ?HH Arranged: NA ?[place] Agency: NA ?  ?  ?  ? ? ?Social Determinants of Health (SDOH) Interventions ?  ? ?Readmission Risk Interventions ? ?  01/26/2021  ?  4:20 PM  ?Readmission Risk Prevention Plan  ?Transportation Screening Complete  ?PCP or Specialist Appt within 3-5 Days Complete  ?[place] or Home Care Consult Complete  ?Social Work Consult for [place] Planning/Counseling Complete  ?Palliative Care Screening Not Applicable  ?Medication Review Press photographer) Complete  ? ? ?

## 2021-09-04 NOTE — Progress Notes (Signed)
Physical Therapy Treatment ?Patient Details ?Name: Chad [name] ?MRN: [id number] ?DOB: [birth year] ?Today's Date: 09/04/2021 ? ? ?History of Present Illness 71 year old male with history of alcohol abuse, hypertension, depression, anxiety, history of peptic ulcer disease, nicotine dependence, GERD, hepatic steatosis, diverticulosis, who presents to the emergency department/admitted for chief concerns of failure to thrive, sitting in a chair for 1 month, swelling in his lower extremities ? ?  ?PT Comments  ? ? Pt alert, agreeable to PT, seated in recliner at start and end of session. Pt reported 8/10 LBP, RN notified at end of session. Session focused on promoting pt sit <> stand transfer independence as well as standing balance. Performed three times, minA on second attempt and pt momentarily able to maintain standing with CGA, but 1st and 3rd repetition modA to come up into standing, and minA due to left lateral lean/posterior lean. Further mobility attempts deferred due to fatigue as well as preparation for breakfast. The patient would benefit from further skilled PT intervention to continue to progress towards goals. Recommendation remains appropriate.  ?   ?Recommendations for follow up therapy are one component of a multi-disciplinary discharge planning process, led by the attending physician.  Recommendations may be updated based on patient status, additional functional criteria and insurance authorization. ? ?Follow Up Recommendations ? Skilled nursing-short term rehab (<3 hours/day) ?  ?  ?Assistance Recommended at Discharge Frequent or constant Supervision/Assistance  ?Patient can return home with the following A lot of help with walking and/or transfers;A lot of help with bathing/dressing/bathroom ?  ?Equipment Recommendations ? None recommended by PT  ?  ?Recommendations for Other Services   ? ? ?  ?Precautions / Restrictions Precautions ?Precautions: Fall ?Restrictions ?Weight Bearing Restrictions: No  ?   ? ?Mobility ? Bed Mobility ?  ?  ?  ?  ?  ?  ?  ?General bed mobility comments: pt up in recliner at start/end of session ?  ? ?Transfers ?Overall transfer level: Needs assistance ?Equipment used: Rolling walker (2 wheels) ?Transfers: Sit to/from Stand ?Sit to Stand: Min assist, Mod assist ?  ?  ?  ?  ?  ?General transfer comment: performed 3 times, second attempt with minA, 1sta nd 3rd modA. Pt with lateral lean with second attempt, unable to correct independently, pt reported fatigue ?  ? ?Ambulation/Gait ?  ?  ?  ?  ?  ?  ?  ?General Gait Details: due to fatigue, deferred ? ? ?Stairs ?  ?  ?  ?  ?  ? ? ?Wheelchair Mobility ?  ? ?Modified Rankin (Stroke Patients Only) ?  ? ? ?  ?Balance Overall balance assessment: Needs assistance ?Sitting-balance support: Single extremity supported, Feet supported ?  ?Sitting balance - Comments: initially fair, with fatigue left lateral lean noted in chair, propped with pillows and repositioned at end of session ?  ?Standing balance support: Bilateral upper extremity supported, Reliant on assistive device for balance ?Standing balance-Leahy Scale: Poor ?Standing balance comment: poor standing balance, reliant on physical assist and RW, 3rd attempt more noticable decreased in upright standing balance due to left lateral lean ?  ?  ?  ?  ?  ?  ?  ?  ?  ?  ?  ?  ? ?  ?Cognition Arousal/Alertness: Awake/alert ?Behavior During Therapy: Flat affect ?Overall Cognitive Status: No family/caregiver present to determine baseline cognitive functioning ?  ?  ?  ?  ?  ?  ?  ?  ?  ?  ?  ?  ?  ?  ?  ?  ?  General Comments: pt oriented to self, able to follow commands with increased time ?  ?  ? ?  ?Exercises   ? ?  ?General Comments   ?  ?  ? ?Pertinent Vitals/Pain Pain Assessment ?Pain Assessment: 0-10 ?Pain Score: 8  ?Pain Location: chronic back pain ?Pain Descriptors / Indicators: Constant, Aching ?Pain Intervention(s): Limited activity within patient's tolerance, Monitored during session,  Repositioned  ? ? ?Home Living   ?  ?  ?  ?  ?  ?  ?  ?  ?  ?   ?  ?Prior Function    ?  ?  ?   ? ?PT Goals (current goals can now be found in the care plan section) Progress towards PT goals: Progressing toward goals ? ?  ?Frequency ? ? ? Min 2X/week ? ? ? ?  ?PT Plan Current plan remains appropriate  ? ? ?Co-evaluation   ?  ?  ?  ?  ? ?  ?AM-PAC PT "6 Clicks" Mobility   ?Outcome Measure ? Help needed turning from your back to your side while in a flat bed without using bedrails?: A Lot ?Help needed moving from lying on your back to sitting on the side of a flat bed without using bedrails?: A Lot ?Help needed moving to and from a bed to a chair (including a wheelchair)?: A Lot ?Help needed standing up from a chair using your arms (e.g., wheelchair or bedside chair)?: A Lot ?Help needed to walk in hospital room?: A Lot ?Help needed climbing 3-5 steps with a railing? : Total ?6 Click Score: 11 ? ?  ?End of Session   ?Activity Tolerance: Patient tolerated treatment well ?Patient left: in bed;with call bell/phone within reach;with chair alarm set ?Nurse Communication: Mobility status ?PT Visit Diagnosis: Muscle weakness (generalized) (M62.81);Difficulty in walking, not elsewhere classified (R26.2) ?Pain - Right/Left: Right ?Pain - part of body: Shoulder ?  ? ? ?Time: 2353-6144 ?PT Time Calculation (min) (ACUTE ONLY): 11 min ? ?Charges:  $Therapeutic Exercise: 8-22 mins          ?          ? ?Olga Coaster PT, DPT ?9:18 AM,09/04/21 ? ? ?

## 2021-09-05 LAB — COMPREHENSIVE METABOLIC PANEL
ALT: 24 U/L (ref 0–44)
AST: 38 U/L (ref 15–41)
Albumin: 2.7 g/dL — ABNORMAL LOW (ref 3.5–5.0)
Alkaline Phosphatase: 92 U/L (ref 38–126)
Anion gap: 8 (ref 5–15)
BUN: 16 mg/dL (ref 8–23)
CO2: 28 mmol/L (ref 22–32)
Calcium: 9 mg/dL (ref 8.9–10.3)
Chloride: 100 mmol/L (ref 98–111)
Creatinine, Ser: 0.75 mg/dL (ref 0.61–1.24)
GFR, Estimated: >60 mL/min (ref >60)
Glucose, Bld: 104 mg/dL — ABNORMAL HIGH (ref 70–99)
Potassium: 4.3 mmol/L (ref 3.5–5.1)
Sodium: 136 mmol/L (ref 135–145)
Total Bilirubin: 0.4 mg/dL (ref 0.3–1.2)
Total Protein: 6.6 g/dL (ref 6.5–8.1)

## 2021-09-05 LAB — CBC
HCT: 38.4 % — ABNORMAL LOW (ref 39.0–52.0)
Hemoglobin: 12.4 g/dL — ABNORMAL LOW (ref 13.0–17.0)
MCH: 30.2 pg (ref 26.0–34.0)
MCHC: 32.3 g/dL (ref 30.0–36.0)
MCV: 93.4 fL (ref 80.0–100.0)
Platelets: 288 10*3/uL (ref 150–400)
RBC: 4.11 MIL/uL — ABNORMAL LOW (ref 4.22–5.81)
RDW: 14.4 % (ref 11.5–15.5)
WBC: 8.4 10*3/uL (ref 4.0–10.5)
nRBC: 0 % (ref 0.0–0.2)

## 2021-09-05 NOTE — Progress Notes (Signed)
[facility] Progress Note  Patient: Chad [name]    KGU:[id number]  DOA: 08/26/2021    Date of Service: the patient was seen and examined on 09/05/2021  Brief hospital course: [name] is a 71 year old male with history of alcohol abuse, hypertension, depression, anxiety, history of peptic ulcer disease, nicotine dependence, GERD, hepatic steatosis, diverticulosis, who presented to the emergency department on 5/8 with concerns of failure to thrive, sitting in a chair for 1 month, swelling in his lower extremities.  Patient with long history of alcohol abuse and unable to care for himself.  He at times has been to skilled nursing facilities, but then checks himself out and goes back to resuming drinking.Patient had not been taking his home medications.  Over next few days after receiving medications for withdrawals and stabilization and hydrated, plan was for patient to go to skilled nursing again.  Patient's daughter reached out and request that patient go to a skilled nursing facility in [place], closer to her so that she can aid in his care and prevent recurring cycle of facility transfer and then leaving AMA.  Assessment and Plan: Assessment and Plan: Anxiety and depression - cont trazodone 150 mg nightly, venlafaxine to 225 mg p.o. daily, mirtazapine 30 mg nightly - Hydroxyzine 25 mg every 6 hours as needed for itching, anxiety  Alcohol dependence with uncomplicated withdrawal (HCC) --detoxed while inpatient.  Chronic back pain --no current Rx for opioids --Tylenol 1g TID PRN --Advil PRN --will not start opioids  Protein-calorie malnutrition, severe Nutrition Status: Nutrition Problem: Severe Malnutrition Etiology: chronic illness (ETOH abuse) Signs/Symptoms: energy intake < or equal to 75% for > or equal to 1 month, moderate fat depletion, severe fat depletion, moderate muscle depletion, severe muscle depletion Interventions: Ensure Enlive (each supplement provides  350kcal and 20 grams of protein), MVI, Liberalize Diet     Adult failure to thrive --from alcohol abuse, now is dependent on ADL's.   Contact dermatitis Sacral ulcer, ruled out --contact dermatitis from exposure from his stool and urine --wound care consulted --barrier cream  Dysuria -urine cx with multiple species present, suggesting chronic colonization.  --did not start on abx  Essential hypertension -Patient has not been eating or taking his medications for at least 1 month PTA - cont Metoprolol succinate 25 mg daily    Elevated lactic acid level Felt to be secondary to intravascular volume depletion from heavy drinking and poor p.o. intake.  Sepsis ruled out.       Body mass index is 22.46 kg/m.  Nutrition Problem: Severe Malnutrition Etiology: chronic illness (ETOH abuse)     Consultants: None   Procedures: None   Antimicrobials: None   Code Status: Full    Subjective: Patient okay, no complaints  Objective: Vital signs were reviewed and unremarkable. Vitals:   09/05/21 0510 09/05/21 0809  BP: (!) 141/79 (!) 157/92  Pulse: 67 71  Resp: 19 18  Temp: 98 F (36.7 C) 97.8 F (36.6 C)  SpO2: 100% 97%    Intake/Output Summary (Last 24 hours) at 09/05/2021 1449 Last data filed at 09/05/2021 1328 Gross per 24 hour  Intake --  Output 1900 ml  Net -1900 ml    Filed Weights   08/26/21 1720  Weight: 71 kg   Body mass index is 22.46 kg/m.  Exam: About the same from previous day General: Alert and oriented x2, no acute distress HEENT: Normocephalic atraumatic, mucous membranes moist Cardiovascular: Regular rate and rhythm, S1-S2 Respiratory: Clear to auscultation bilaterally Abdomen:  Soft, nontender, nondistended, positive bowel sounds Musculoskeletal: No clubbing or cyanosis, trace pitting edema  Data Reviewed: There are no new results to review at this time.  Disposition:  Status is: Inpatient Remains inpatient appropriate because:  Waiting for skilled nursing facility approval    Anticipated discharge date: Potentially 5/19   Family Communication: Left message for daughter DVT Prophylaxis: enoxaparin (LOVENOX) injection 40 mg Start: 08/26/21 2200 Place TED hose Start: 08/26/21 2103    Author: Hollice Espy ,MD 09/05/2021 2:49 PM  To reach On-call, see care teams to locate the attending and reach out via [email or web address]. Between 7PM-7AM, please contact night-coverage If you still have difficulty reaching the attending provider, please page the [facility] (Director on Call) for [facility] on amion for assistance.

## 2021-09-05 NOTE — TOC Progression Note (Signed)
Transition of Care [facility]) - Progression Note    Patient Details  Name: Elhadji Pecore MRN: [id number] Date of Birth: Nov 28, 1950  Transition of Care [facility]) CM/SW Contact  Allayne Butcher, RN Phone Number: 09/05/2021, 4:20 PM  Clinical Narrative:    Certification #[id number].  Approved 5/18 - 5/20, next review due 5/22.  Nurse reviewer: Vilinda Boehringer.  Her number is [phone].  If facility isn't integrated with [facility] system, updates can be faxed to [phone].  [employer] authorization approved.  Pasrr still pending.    Expected Discharge Plan: Skilled Nursing Facility Barriers to Discharge: Continued Medical Work up  Expected Discharge Plan and Services Expected Discharge Plan: Skilled Nursing Facility   Discharge Planning Services: CM Consult Post Acute Care Choice: Skilled Nursing Facility Living arrangements for the past 2 months: Apartment                 DME Arranged: N/A DME Agency: NA       HH Arranged: NA HH Agency: NA         Social Determinants of Health (SDOH) Interventions    Readmission Risk Interventions    01/26/2021    4:20 PM  Readmission Risk Prevention Plan  Transportation Screening Complete  PCP or Specialist Appt within 3-5 Days Complete  HRI or Home Care Consult Complete  Social Work Consult for Recovery Care Planning/Counseling Complete  Palliative Care Screening Not Applicable  Medication Review Oceanographer) Complete

## 2021-09-05 NOTE — TOC Progression Note (Signed)
Transition of Care [facility]) - Progression Note    Patient Details  Name: Chad [name] MRN: [id number] Date of Birth: 06-Nov-1950  Transition of Care [facility]) CM/SW Contact  Shelbie Hutching, RN Phone Number: 09/05/2021, 10:24 AM  Clinical Narrative:    Daughter would like to accept bed to [facility], Pasrr is pending.  Insurance auth through [employer] will be started.     Expected Discharge Plan: [place] Barriers to Discharge: Continued Medical Work up  Expected Discharge Plan and Services Expected Discharge Plan: [place]   Discharge Planning Services: CM Consult Post Acute Care Choice: [place] Living arrangements for the past 2 months: Apartment                 DME Arranged: N/A DME Agency: NA       HH Arranged: NA HH Agency: NA         Social Determinants of Health (SDOH) Interventions    Readmission Risk Interventions    01/26/2021    4:20 PM  Readmission Risk Prevention Plan  Transportation Screening Complete  PCP or Specialist Appt within 3-5 Days Complete  HRI or [place] Complete  Social Work Consult for [place] Planning/Counseling Complete  Palliative Care Screening Not Applicable  Medication Review Press photographer) Complete

## 2021-09-05 NOTE — Progress Notes (Signed)
Nutrition Follow-up  DOCUMENTATION CODES:   Severe malnutrition in context of chronic illness  INTERVENTION:   -Continue MVI with minerals daily -Continue Ensure Enlive po TID, each supplement provides 350 kcal and 20 grams of protein -Continue with liberalized diet of regular   NUTRITION DIAGNOSIS:   Severe Malnutrition related to chronic illness (ETOH abuse) as evidenced by energy intake < or equal to 75% for > or equal to 1 month, moderate fat depletion, severe fat depletion, moderate muscle depletion, severe muscle depletion.  Ongoing  GOAL:   Patient will meet greater than or equal to 90% of their needs  Progressing   MONITOR:   PO intake, Supplement acceptance  REASON FOR ASSESSMENT:   Low Braden    ASSESSMENT:   Pt with history of alcohol abuse, hypertension, depression, anxiety, history of peptic ulcer disease, nicotine dependence, GERD, hepatic steatosis, diverticulosis, who presents for chief concerns of failure to thrive, sitting in a chair for 1 month, swelling in his lower extremities.  Reviewed I/O's: -2.2 L x 24 hours and -4.9 L since admission  UOP: 2.4 L x 24 hours   Pt sleeping soundly at time of visit. He did not arose to voice. Noted multiple snacks at bedside.   Pt with improved oral intake. Noted meal completion 50-100%. Pt is drinking Ensure supplements.   Per TOC notes, pt awaiting SNF placement (daughter accepted bed to [facility] [place]). Pt awaiting insurance authorization for SNF.   Medications reviewed and include folic acid, remeron, and thiamine.   Labs reviewed.   Diet Order:   Diet Order             Diet regular Room service appropriate? Yes; Fluid consistency: Thin  Diet effective now                   EDUCATION NEEDS:   Education needs have been addressed  Skin:  Skin Assessment: Reviewed RN Assessment  Last BM:  Unknown  Height:   Ht Readings from Last 1 Encounters:  08/26/21 5\' 10"  (1.778 m)    Weight:    Wt Readings from Last 1 Encounters:  08/26/21 71 kg    Ideal Body Weight:  75.5 kg  BMI:  Body mass index is 22.46 kg/m.  Estimated Nutritional Needs:   Kcal:  10/26/21  Protein:  125-140 grams  Fluid:  > 2 L    [id number], RD, LDN, CDCES Registered Dietitian II Certified Diabetes Care and Education Specialist Please refer to [facility] for RD and/or RD on-call/weekend/after hours pager

## 2021-09-05 NOTE — TOC Progression Note (Signed)
Transition of Care [facility]) - Progression Note    Patient Details  Name: Drazen Eckert MRN: [id number] Date of Birth: February 27, 1951  Transition of Care [facility]) CM/SW Contact  Shelbie Hutching, RN Phone Number: 09/05/2021, 2:28 PM  Clinical Narrative:    [place] insurance authorization has been submitted.  RNCM reached out to [facility], they report that whenever the auth comes back they have a bed, they also accept weekend admission.  RNCM updated daughter, she will probably be travelling back home and return over the weekend to see the patient again.     Expected Discharge Plan: [place] Barriers to Discharge: Continued Medical Work up  Expected Discharge Plan and Services Expected Discharge Plan: [place]   Discharge Planning Services: CM Consult Post Acute Care Choice: [place] Living arrangements for the past 2 months: Apartment                 DME Arranged: N/A DME Agency: NA       HH Arranged: NA HH Agency: NA         Social Determinants of Health (SDOH) Interventions    Readmission Risk Interventions    01/26/2021    4:20 PM  Readmission Risk Prevention Plan  Transportation Screening Complete  PCP or Specialist Appt within 3-5 Days Complete  HRI or [place] Complete  Social Work Consult for [place] Planning/Counseling Complete  Palliative Care Screening Not Applicable  Medication Review Press photographer) Complete

## 2021-09-05 NOTE — Plan of Care (Signed)
  Problem: Health Behavior/Discharge Planning: Goal: Ability to manage health-related needs will improve Outcome: Progressing   Problem: Nutrition: Goal: Adequate nutrition will be maintained Outcome: Progressing   Problem: Safety: Goal: Ability to remain free from injury will improve Outcome: Progressing   

## 2021-09-05 NOTE — Progress Notes (Signed)
Physical Therapy Treatment Patient Details Name: Chad [name] MRN: [id number] DOB: February 24, 1951 Today's Date: 09/05/2021   History of Present Illness 71 year old male with history of alcohol abuse, hypertension, depression, anxiety, history of peptic ulcer disease, nicotine dependence, GERD, hepatic steatosis, diverticulosis, who presents to the emergency department/admitted for chief concerns of failure to thrive, sitting in a chair for 1 month, swelling in his lower extremities    PT Comments    Patient is making progress towards meeting goals. He was cooperative with session. Multiple standing bouts performed with assistance with standing tolerance of 20 seconds. Seated rest breaks required during session due to fatigue. Mod A for stand step transfer from bed to chair. Patient wanted to remain sitting up in recliner chair at end of session which is encouraged to promote upright conditioning. Recommend to continue PT to maximize independence and facilitate return to prior level of function.    Recommendations for follow up therapy are one component of a multi-disciplinary discharge planning process, led by the attending physician.  Recommendations may be updated based on patient status, additional functional criteria and insurance authorization.  Follow Up Recommendations  Skilled nursing-short term rehab (<3 hours/day)     Assistance Recommended at Discharge Frequent or constant Supervision/Assistance  Patient can return home with the following A lot of help with walking and/or transfers;A lot of help with bathing/dressing/bathroom   Equipment Recommendations  None recommended by PT    Recommendations for Other Services       Precautions / Restrictions Precautions Precautions: Fall Restrictions Weight Bearing Restrictions: No     Mobility  Bed Mobility Overal bed mobility: Needs Assistance Bed Mobility: Supine to Sit     Supine to sit: Mod assist, HOB elevated      General bed mobility comments: assistance for trunk and BLE support. increased time and effort required. cues for sequencing    Transfers Overall transfer level: Needs assistance Equipment used: None Transfers: Sit to/from Stand, Bed to chair/wheelchair/BSC Sit to Stand: Min assist, From elevated surface (x 2 bouts of sit to stand from bed)   Step pivot transfers: Mod assist       General transfer comment: 2 bouts of standing performed from bed with rest breaks between bouts. cues for safety provided. stand step transfer from bed to recliner chair with cues for technique and steadying assistance provided    Ambulation/Gait               General Gait Details: not attempted due to poor standing tolerance   Stairs             Wheelchair Mobility    Modified Rankin (Stroke Patients Only)       Balance   Sitting-balance support: Feet supported Sitting balance-Leahy Scale: Fair Sitting balance - Comments: no loss of balance while sitting   Standing balance support: Single extremity supported Standing balance-Leahy Scale: Poor Standing balance comment: Min A required to maintain standing balance. standing tolerance of up to 20 seconds. sitting rest break required between bouts of standing                            Cognition Arousal/Alertness: Awake/alert Behavior During Therapy: Flat affect Overall Cognitive Status: No family/caregiver present to determine baseline cognitive functioning                                 General Comments:  patient following all commands with increased time        Exercises      General Comments        Pertinent Vitals/Pain Pain Assessment Pain Assessment: No/denies pain    Home Living                          Prior Function            PT Goals (current goals can now be found in the care plan section) Acute Rehab PT Goals Patient Stated Goal: none stated PT Goal Formulation:  With patient Time For Goal Achievement: 09/10/21 Potential to Achieve Goals: Fair Progress towards PT goals: Progressing toward goals    Frequency    Min 2X/week      PT Plan Current plan remains appropriate    Co-evaluation              AM-PAC PT "6 Clicks" Mobility   Outcome Measure  Help needed turning from your back to your side while in a flat bed without using bedrails?: A Lot Help needed moving from lying on your back to sitting on the side of a flat bed without using bedrails?: A Lot Help needed moving to and from a bed to a chair (including a wheelchair)?: A Lot Help needed standing up from a chair using your arms (e.g., wheelchair or bedside chair)?: A Lot Help needed to walk in hospital room?: A Lot Help needed climbing 3-5 steps with a railing? : Total 6 Click Score: 11    End of Session   Activity Tolerance: Patient tolerated treatment well Patient left: in chair;with call bell/phone within reach;with chair alarm set Nurse Communication: Mobility status (via secure chat) PT Visit Diagnosis: Muscle weakness (generalized) (M62.81);Difficulty in walking, not elsewhere classified (R26.2)     Time: 1914-7829 PT Time Calculation (min) (ACUTE ONLY): 14 min  Charges:  $Therapeutic Activity: 8-22 mins                     Donna Bernard, PT, MPT    [name] 09/05/2021, 2:23 PM

## 2021-09-05 NOTE — TOC Progression Note (Signed)
Transition of Care [facility]) - Progression Note    Patient Details  Name: Devesh Monforte MRN: [id number] Date of Birth: May 04, 1950  Transition of Care [facility]) CM/SW Contact  [place] Cellar, RN Phone Number: 09/05/2021, 10:13 AM  Clinical Narrative:    Spoke to daughter who has agreed to transfer patient to [facility] and wait for bed opening in [place]. Daughter wants to transfer if possible. Will start insurance auth. Daughter confirmed patient has new clean clothes in his room for transfer.    Expected Discharge Plan: Skilled Nursing Facility Barriers to Discharge: Continued Medical Work up  Expected Discharge Plan and Services Expected Discharge Plan: Skilled Nursing Facility   Discharge Planning Services: CM Consult Post Acute Care Choice: Skilled Nursing Facility Living arrangements for the past 2 months: Apartment                 DME Arranged: N/A DME Agency: NA       HH Arranged: NA HH Agency: NA         Social Determinants of Health (SDOH) Interventions    Readmission Risk Interventions    01/26/2021    4:20 PM  Readmission Risk Prevention Plan  Transportation Screening Complete  PCP or Specialist Appt within 3-5 Days Complete  HRI or Home Care Consult Complete  Social Work Consult for Recovery Care Planning/Counseling Complete  Palliative Care Screening Not Applicable  Medication Review Oceanographer) Complete

## 2021-09-06 NOTE — TOC Progression Note (Signed)
Transition of Care [facility]) - Progression Note    Patient Details  Name: Jedediah Noda MRN: [id number] Date of Birth: [birth year]  Transition of Care [facility]) CM/SW Contact  Allayne Butcher, RN Phone Number: 09/06/2021, 9:36 AM  Clinical Narrative:     Reached out to [place] [facility] to see if the Pasrr can be expedited.   Expected Discharge Plan: Skilled Nursing Facility Barriers to Discharge: Continued Medical Work up  Expected Discharge Plan and Services Expected Discharge Plan: Skilled Nursing Facility   Discharge Planning Services: CM Consult Post Acute Care Choice: Skilled Nursing Facility Living arrangements for the past 2 months: Apartment                 DME Arranged: N/A DME Agency: NA       HH Arranged: NA HH Agency: NA         Social Determinants of Health (SDOH) Interventions    Readmission Risk Interventions    01/26/2021    4:20 PM  Readmission Risk Prevention Plan  Transportation Screening Complete  PCP or Specialist Appt within 3-5 Days Complete  HRI or Home Care Consult Complete  Social Work Consult for Recovery Care Planning/Counseling Complete  Palliative Care Screening Not Applicable  Medication Review Oceanographer) Complete

## 2021-09-06 NOTE — Care Management Important Message (Signed)
Important Message  Patient Details  Name: Chad [name] MRN: [id number] Date of Birth: 09/18/1950   Medicare Important Message Given:  Yes     [name] 09/06/2021, 9:39 AM

## 2021-09-06 NOTE — Progress Notes (Signed)
Occupational Therapy Treatment Patient Details Name: Chad [name] MRN: [id number] DOB: 07/08/1950 Today's Date: 09/06/2021   History of present illness 71 year old male with history of alcohol abuse, hypertension, depression, anxiety, history of peptic ulcer disease, nicotine dependence, GERD, hepatic steatosis, diverticulosis, who presents to the emergency department/admitted for chief concerns of failure to thrive, sitting in a chair for 1 month, swelling in his lower extremities   OT comments  Upon entering the room, pt supine in bed with c/o chronic back pain but agreeable to OT intervention. Pt reports need for BM and requesting to get onto [facility]. Pt performed bed mobility with min A to EOB for trunk support. Pt stands and transfers with mod A stand pivot to [facility]. He has BM and needs assistance for clothing management and hygiene. Pt then transfers back to sit on EOB and then into recliner chair once set up with mod A overall. Chair alarm activated for safety with all needs within reach. Pt continues to benefit from OT intervention with recommendation for SNF at discharge.    Recommendations for follow up therapy are one component of a multi-disciplinary discharge planning process, led by the attending physician.  Recommendations may be updated based on patient status, additional functional criteria and insurance authorization.    Follow Up Recommendations  Skilled nursing-short term rehab (<3 hours/day)    Assistance Recommended at Discharge Frequent or constant Supervision/Assistance  Patient can return home with the following  A little help with walking and/or transfers;A little help with bathing/dressing/bathroom;Help with stairs or ramp for entrance;Assist for transportation   Equipment Recommendations  Other (comment) (defer to next venue of care)       Precautions / Restrictions Precautions Precautions: Fall Restrictions Weight Bearing Restrictions: No       Mobility Bed  Mobility Overal bed mobility: Needs Assistance Bed Mobility: Supine to Sit Rolling: Min assist         General bed mobility comments: assistance for trunk support    Transfers Overall transfer level: Needs assistance Equipment used: None Transfers: Sit to/from Stand, Bed to chair/wheelchair/BSC Sit to Stand: Min assist     Step pivot transfers: Mod assist           Balance Overall balance assessment: Needs assistance Sitting-balance support: Feet supported Sitting balance-Leahy Scale: Good Sitting balance - Comments: no loss of balance while sitting   Standing balance support: Single extremity supported Standing balance-Leahy Scale: Poor Standing balance comment: Min A required to maintain standing balance. standing tolerance of up to 20 seconds. sitting rest break required between bouts of standing                           ADL either performed or assessed with clinical judgement   ADL Overall ADL's : Needs assistance/impaired                         Toilet Transfer: Moderate assistance;BSC/3in1   Toileting- Clothing Manipulation and Hygiene: Moderate assistance;Sit to/from stand              Extremity/Trunk Assessment Upper Extremity Assessment Upper Extremity Assessment: Generalized weakness   Lower Extremity Assessment Lower Extremity Assessment: Generalized weakness        Vision Patient Visual Report: No change from baseline            Cognition Arousal/Alertness: Awake/alert Behavior During Therapy: Flat affect Overall Cognitive Status: No family/caregiver present to determine baseline cognitive functioning  General Comments: patient following all commands with increased time                   Pertinent Vitals/ Pain       Pain Assessment Pain Assessment: Faces Faces Pain Scale: Hurts little more Pain Location: chronic back pain Pain Descriptors / Indicators:  Constant, Aching Pain Intervention(s): Limited activity within patient's tolerance, Monitored during session, Repositioned         Frequency  Min 2X/week        Progress Toward Goals  OT Goals(current goals can now be found in the care plan section)  Progress towards OT goals: Progressing toward goals  Acute Rehab OT Goals Patient Stated Goal: to get stronger and go to rehab OT Goal Formulation: With patient Time For Goal Achievement: 09/13/21 Potential to Achieve Goals: Good  Plan Discharge plan remains appropriate;Frequency remains appropriate       AM-PAC OT "6 Clicks" Daily Activity     Outcome Measure   Help from another person eating meals?: None Help from another person taking care of personal grooming?: A Little Help from another person toileting, which includes using toliet, bedpan, or urinal?: A Lot Help from another person bathing (including washing, rinsing, drying)?: A Lot Help from another person to put on and taking off regular upper body clothing?: A Little Help from another person to put on and taking off regular lower body clothing?: A Lot 6 Click Score: 16    End of Session Equipment Utilized During Treatment: Other (comment) (BSC)  OT Visit Diagnosis: Unsteadiness on feet (R26.81);Muscle weakness (generalized) (M62.81);History of falling (Z91.81)   Activity Tolerance Patient tolerated treatment well   Patient Left in chair;with call bell/phone within reach;with chair alarm set   Nurse Communication Mobility status        Time: 4097-3532 OT Time Calculation (min): 12 min  Charges: OT General Charges $OT Visit: 1 Visit OT Treatments $Self Care/Home Management : 8-22 mins  Jackquline Denmark, MS, OTR/L , CBIS ascom [phone]  09/06/21, 1:27 PM

## 2021-09-06 NOTE — Progress Notes (Signed)
[facility] Progress Note  Patient: Chad [name]    DQQ:[id number]  DOA: 08/26/2021    Date of Service: the patient was seen and examined on 09/06/2021  Brief hospital course: Mr. Ceylon Arenson is a 71 year old male with history of alcohol abuse, hypertension, depression, anxiety, history of peptic ulcer disease, nicotine dependence, GERD, hepatic steatosis, diverticulosis, who presented to the emergency department on 5/8 with concerns of failure to thrive, sitting in a chair for 1 month, swelling in his lower extremities.  Patient with long history of alcohol abuse and unable to care for himself.  He at times has been to skilled nursing facilities, but then checks himself out and goes back to resuming drinking.Patient had not been taking his home medications.  Over next few days after receiving medications for withdrawals and stabilization and hydrated, plan was for patient to go to skilled nursing again.  Patient's daughter reached out and request that patient go to a skilled nursing facility in [place], closer to her so that she can aid in his care and prevent recurring cycle of facility transfer and then leaving AMA.  Assessment and Plan: Assessment and Plan: Anxiety and depression - cont trazodone 150 mg nightly, venlafaxine to 225 mg p.o. daily, mirtazapine 30 mg nightly - Hydroxyzine 25 mg every 6 hours as needed for itching, anxiety  Alcohol dependence with uncomplicated withdrawal (HCC) --detoxed while inpatient.  Chronic back pain --no current Rx for opioids --Tylenol 1g TID PRN --Advil PRN --will not start opioids  Protein-calorie malnutrition, severe Nutrition Status: Nutrition Problem: Severe Malnutrition Etiology: chronic illness (ETOH abuse) Signs/Symptoms: energy intake < or equal to 75% for > or equal to 1 month, moderate fat depletion, severe fat depletion, moderate muscle depletion, severe muscle depletion Interventions: Ensure Enlive (each supplement provides  350kcal and 20 grams of protein), MVI, Liberalize Diet     Adult failure to thrive --from alcohol abuse, now is dependent on ADL's.   Contact dermatitis Sacral ulcer, ruled out --contact dermatitis from exposure from his stool and urine --wound care consulted --barrier cream  Dysuria -urine cx with multiple species present, suggesting chronic colonization.  --did not start on abx  Essential hypertension -Patient has not been eating or taking his medications for at least 1 month PTA - cont Metoprolol succinate 25 mg daily    Elevated lactic acid level Felt to be secondary to intravascular volume depletion from heavy drinking and poor p.o. intake.  Sepsis ruled out.       Body mass index is 22.46 kg/m.  Nutrition Problem: Severe Malnutrition Etiology: chronic illness (ETOH abuse)     Consultants: None   Procedures: None   Antimicrobials: None   Code Status: Full    Subjective: Patient okay, no complaints  Objective: Vital signs were reviewed and unremarkable. Vitals:   09/06/21 0432 09/06/21 0732  BP: 128/86 125/83  Pulse: 94 87  Resp: 16 17  Temp: 97.9 F (36.6 C) 98.3 F (36.8 C)  SpO2: 97% 97%    Intake/Output Summary (Last 24 hours) at 09/06/2021 1320 Last data filed at 09/06/2021 0735 Gross per 24 hour  Intake 357 ml  Output 1110 ml  Net -753 ml    Filed Weights   08/26/21 1720  Weight: 71 kg   Body mass index is 22.46 kg/m.  Exam: Unchanged from previous day General: Alert and oriented x2, no acute distress HEENT: Normocephalic atraumatic, mucous membranes moist Cardiovascular: Regular rate and rhythm, S1-S2 Respiratory: Clear to auscultation bilaterally Abdomen: Soft, nontender, nondistended,  positive bowel sounds Musculoskeletal: No clubbing or cyanosis, trace pitting edema  Data Reviewed: There are no new results to review at this time.  Disposition:  Status is: Inpatient Remains inpatient appropriate because: Waiting  for pasarr, skilled nursing bed approved in [place].    Anticipated discharge date: Potentially in the next few days   Family Communication: Left message for daughter DVT Prophylaxis: enoxaparin (LOVENOX) injection 40 mg Start: 08/26/21 2200 Place TED hose Start: 08/26/21 2103    Author: Hollice Espy ,MD 09/06/2021 1:20 PM  To reach On-call, see care teams to locate the attending and reach out via [email or web address]. Between 7PM-7AM, please contact night-coverage If you still have difficulty reaching the attending provider, please page the [facility] (Director on Call) for [facility] on amion for assistance.

## 2021-09-06 NOTE — TOC Progression Note (Signed)
Transition of Care [facility]) - Progression Note    Patient Details  Name: Navraj Dreibelbis MRN: [id number] Date of Birth: 1950-08-30  [facility] CM/SW Contact  Allayne Butcher, RN Phone Number: 09/06/2021, 3:52 PM  Clinical Narrative:    Still waiting on Pas[facility], daughter updated that the patient will be here over the weekend.   Expected Discharge Plan: Skilled Nursing Facility Barriers to Discharge: Continued Medical Work up  Expected Discharge Plan and Services Expected Discharge Plan: Skilled Nursing Facility   Discharge Planning Services: CM Consult Post Acute Care Choice: Skilled Nursing Facility Living arrangements for the past 2 months: Apartment                 DME Arranged: N/A DME Agency: NA       HH Arranged: NA HH Agency: NA         Social Determinants of Health (SDOH) Interventions    Readmission Risk Interventions    01/26/2021    4:20 PM  Readmission Risk Prevention Plan  Transportation Screening Complete  PCP or Specialist Appt within 3-5 Days Complete  HRI or Home Care Consult Complete  Social Work Consult for Recovery Care Planning/Counseling Complete  Palliative Care Screening Not Applicable  Medication Review [employer]) Complete

## 2021-09-07 MED ORDER — TRAMADOL HCL 50 MG PO TABS
50.0000 mg | ORAL_TABLET | Freq: Four times a day (QID) | ORAL | Status: DC | PRN
  Administered 2021-09-07 – 2021-09-09 (×2): 50 mg via ORAL
  Filled 2021-09-07 (×2): qty 1

## 2021-09-07 MED ORDER — IBUPROFEN 400 MG PO TABS
400.0000 mg | ORAL_TABLET | Freq: Four times a day (QID) | ORAL | Status: DC | PRN
  Administered 2021-09-10: 400 mg via ORAL
  Filled 2021-09-07: qty 1

## 2021-09-07 MED ORDER — ACETAMINOPHEN 500 MG PO TABS: 1000.0000 mg | ORAL_TABLET | Freq: Three times a day (TID) | ORAL | Status: DC | PRN

## 2021-09-07 NOTE — Progress Notes (Signed)
Physical Therapy Treatment Patient Details Name: Chad [name] MRN: [id number] DOB: 08-11-1950 Today's Date: 09/07/2021   History of Present Illness 71 year old male with history of alcohol abuse, hypertension, depression, anxiety, history of peptic ulcer disease, nicotine dependence, GERD, hepatic steatosis, diverticulosis, who presents to the emergency department/admitted for chief concerns of failure to thrive, sitting in a chair for 1 month, swelling in his lower extremities    PT Comments    Patient alert, initially agreeable to attempt mobility but when it came time to transition to EOB but stated he could not. Reported chronic back pain as 7/10. With education and encouragement, pt able to transition to sitting with minA and extra time. Sit <> stand with minA to allow for wet linen change, and modA to take a few steps towards HOB. Pt declined any further mobility or ambulation attempts at this time due to pain. Pt able to reposition in bed with use of bed features minA as well. The patient would benefit from further skilled PT intervention to progress towards goals as able. Recommendation remains appropriate.     Recommendations for follow up therapy are one component of a multi-disciplinary discharge planning process, led by the attending physician.  Recommendations may be updated based on patient status, additional functional criteria and insurance authorization.  Follow Up Recommendations  Skilled nursing-short term rehab (<3 hours/day)     Assistance Recommended at Discharge Frequent or constant Supervision/Assistance  Patient can return home with the following A lot of help with walking and/or transfers;A lot of help with bathing/dressing/bathroom   Equipment Recommendations  None recommended by PT    Recommendations for Other Services       Precautions / Restrictions Precautions Precautions: Fall Restrictions Weight Bearing Restrictions: No     Mobility  Bed  Mobility Overal bed mobility: Needs Assistance Bed Mobility: Supine to Sit, Sit to Supine     Supine to sit: Min assist Sit to supine: Min assist   General bed mobility comments: assistance for BLE assist and minimal trunk elevation to come into sitting. BLE assist for returning to supine    Transfers Overall transfer level: Needs assistance Equipment used: Rolling walker (2 wheels) Transfers: Sit to/from Stand Sit to Stand: Min assist           General transfer comment: pt stood ~10 seconds to allow for linen change, and took 2-3 steps towards HOB with modA. returned to sitting impulsively    Ambulation/Gait               General Gait Details: ture ambulation declined by pt today   Stairs             Wheelchair Mobility    Modified Rankin (Stroke Patients Only)       Balance Overall balance assessment: Needs assistance Sitting-balance support: Feet supported Sitting balance-Leahy Scale: Fair Sitting balance - Comments: no loss of balance while sitting   Standing balance support: Reliant on assistive device for balance Standing balance-Leahy Scale: Poor Standing balance comment: Min A required to maintain standing balance.                            Cognition Arousal/Alertness: Awake/alert Behavior During Therapy: Flat affect                                   General Comments: patient following all commands with increased  time        Exercises      General Comments        Pertinent Vitals/Pain Pain Assessment Pain Assessment: 0-10 Pain Score: 7  Pain Location: chronic back pain Pain Descriptors / Indicators: Constant, Aching Pain Intervention(s): Limited activity within patient's tolerance, Monitored during session, Repositioned, Patient requesting pain meds-RN notified    Home Living                          Prior Function            PT Goals (current goals can now be found in the care  plan section) Progress towards PT goals: Progressing toward goals    Frequency    Min 2X/week      PT Plan Current plan remains appropriate    Co-evaluation              AM-PAC PT "6 Clicks" Mobility   Outcome Measure  Help needed turning from your back to your side while in a flat bed without using bedrails?: A Little Help needed moving from lying on your back to sitting on the side of a flat bed without using bedrails?: A Lot Help needed moving to and from a bed to a chair (including a wheelchair)?: A Lot Help needed standing up from a chair using your arms (e.g., wheelchair or bedside chair)?: A Lot Help needed to walk in hospital room?: A Lot Help needed climbing 3-5 steps with a railing? : Total 6 Click Score: 12    End of Session Equipment Utilized During Treatment: Gait belt Activity Tolerance: Patient limited by pain Patient left: with call bell/phone within reach;in bed;with bed alarm set Nurse Communication: Mobility status;Patient requests pain meds PT Visit Diagnosis: Muscle weakness (generalized) (M62.81);Difficulty in walking, not elsewhere classified (R26.2) Pain - Right/Left: Right Pain - part of body: Shoulder     Time: 1046-1100 PT Time Calculation (min) (ACUTE ONLY): 14 min  Charges:  $Therapeutic Activity: 8-22 mins                    Olga Coaster PT, DPT 11:23 AM,09/07/21

## 2021-09-07 NOTE — Progress Notes (Signed)
PT Cancellation Note  Patient Details Name: Chad [name] MRN: [id number] DOB: [birth year]   Cancelled Treatment:    Reason Eval/Treat Not Completed: Other (comment). Pt sleeping soundly, wakes to touch. Breakfast at bedside. Pt assisted with breakfast set up, to re-attempt mobility as able.   Lieutenant Diego PT, DPT 9:09 AM,09/07/21

## 2021-09-07 NOTE — Progress Notes (Signed)
[facility] Progress Note  Patient: Chad [name]    [id number]  DOA: 08/26/2021    Date of Service: the patient was seen and examined on 09/07/2021  Brief hospital course: [name] is a 71 year old male with history of alcohol abuse, hypertension, depression, anxiety, history of peptic ulcer disease, nicotine dependence, GERD, hepatic steatosis, diverticulosis, who presented to the emergency department on 5/8 with concerns of failure to thrive, sitting in a chair for 1 month, swelling in his lower extremities.  Patient with long history of alcohol abuse and unable to care for himself.  He at times has been to skilled nursing facilities, but then checks himself out and goes back to resuming drinking.Patient had not been taking his home medications.  Over next few days after receiving medications for withdrawals and stabilization and hydrated, plan was for patient to go to skilled nursing again.  Patient's daughter reached out and request that patient go to a skilled nursing facility in [place], closer to her so that she can aid in his care and prevent recurring cycle of facility transfer and then leaving AMA.  Assessment and Plan: Assessment and Plan: Anxiety and depression - cont trazodone 150 mg nightly, venlafaxine to 225 mg p.o. daily, mirtazapine 30 mg nightly - Hydroxyzine 25 mg every 6 hours as needed for itching, anxiety  Alcohol dependence with uncomplicated withdrawal ([place]) --detoxed while inpatient.  Chronic back pain --no current Rx for opioids --Tylenol 1g TID PRN --Advil PRN --will not start opioids  Protein-calorie malnutrition, severe Nutrition Status: Nutrition Problem: Severe Malnutrition Etiology: chronic illness (ETOH abuse) Signs/Symptoms: energy intake < or equal to 75% for > or equal to 1 month, moderate fat depletion, severe fat depletion, moderate muscle depletion, severe muscle depletion Interventions: Ensure Enlive (each supplement provides  350kcal and 20 grams of protein), MVI, Liberalize Diet     Adult failure to thrive --from alcohol abuse, now is dependent on ADL's.   Contact dermatitis Sacral ulcer, ruled out --contact dermatitis from exposure from his stool and urine --wound care consulted --barrier cream  Dysuria -urine cx with multiple species present, suggesting chronic colonization.  --did not start on abx  Essential hypertension -Patient has not been eating or taking his medications for at least 1 month PTA - cont Metoprolol succinate 25 mg daily    Elevated lactic acid level Felt to be secondary to intravascular volume depletion from heavy drinking and poor p.o. intake.  Sepsis ruled out.       Body mass index is 22.46 kg/m.  Nutrition Problem: Severe Malnutrition Etiology: chronic illness (ETOH abuse)     Consultants: None   Procedures: None   Antimicrobials: None   Code Status: Full    Subjective: Complains of chronic lower back pain  Objective: Vital signs were reviewed and unremarkable. Vitals:   09/07/21 0533 09/07/21 0805  BP: (!) 145/74 132/82  Pulse: 79 90  Resp: 18 16  Temp: 98.1 F (36.7 C) 97.8 F (36.6 C)  SpO2: 97% 96%    Intake/Output Summary (Last 24 hours) at 09/07/2021 1314 Last data filed at 09/07/2021 0900 Gross per 24 hour  Intake 480 ml  Output 300 ml  Net 180 ml    Filed Weights   08/26/21 1720  Weight: 71 kg   Body mass index is 22.46 kg/m.  Exam: Unchanged from previous day General: Alert and oriented x2, no acute distress HEENT: Normocephalic atraumatic, mucous membranes moist Cardiovascular: Regular rate and rhythm, S1-S2 Respiratory: Clear to auscultation bilaterally Abdomen:  Soft, nontender, nondistended, positive bowel sounds Musculoskeletal: No clubbing or cyanosis, trace pitting edema  Data Reviewed: There are no new results to review at this time.  Disposition:  Status is: Inpatient Remains inpatient appropriate  because: Waiting for pasarr, skilled nursing bed approved in [place].    Anticipated discharge date: Potentially in the next few days   Family Communication: Left message for daughter DVT Prophylaxis: enoxaparin (LOVENOX) injection 40 mg Start: 08/26/21 2200 Place TED hose Start: 08/26/21 2103    Author: Annita Brod ,MD 09/07/2021 1:14 PM  To reach On-call, see care teams to locate the attending and reach out via [email or web address]. Between 7PM-7AM, please contact night-coverage If you still have difficulty reaching the attending provider, please page the [facility] (Director on Call) for [facility] on amion for assistance.

## 2021-09-08 NOTE — Progress Notes (Signed)
[facility] Progress Note  Patient: Chad [name]    VOJ:[id number]  DOA: 08/26/2021    Date of Service: the patient was seen and examined on 09/08/2021  Brief hospital course: [name] is a 71 year old male with history of alcohol abuse, hypertension, depression, anxiety, history of peptic ulcer disease, nicotine dependence, GERD, hepatic steatosis, diverticulosis, who presented to the emergency department on 5/8 with concerns of failure to thrive, sitting in a chair for 1 month, swelling in his lower extremities.  Patient with long history of alcohol abuse and unable to care for himself.  He at times has been to skilled nursing facilities, but then checks himself out and goes back to resuming drinking.Patient had not been taking his home medications.  Over next few days after receiving medications for withdrawals and stabilization and hydrated, plan was for patient to go to skilled nursing again.  Patient's daughter reached out and request that patient go to a skilled nursing facility in order close to [facility], closer to her so that she can aid in his care and prevent recurring cycle of facility transfer and then leaving AMA.  Patient currently approved for skilled nursing bed in [place] city and waiting for pasarr.  Assessment and Plan: Assessment and Plan: Anxiety and depression - cont trazodone 150 mg nightly, venlafaxine to 225 mg p.o. daily, mirtazapine 30 mg nightly - Hydroxyzine 25 mg every 6 hours as needed for itching, anxiety  Alcohol dependence with uncomplicated withdrawal (HCC) --detoxed while inpatient.  Chronic back pain --no current Rx for opioids --Tylenol 1g TID PRN --Advil PRN --will not start opioids  Protein-calorie malnutrition, severe Nutrition Status: Nutrition Problem: Severe Malnutrition Etiology: chronic illness (ETOH abuse) Signs/Symptoms: energy intake < or equal to 75% for > or equal to 1 month, moderate fat depletion, severe fat depletion,  moderate muscle depletion, severe muscle depletion Interventions: Ensure Enlive (each supplement provides 350kcal and 20 grams of protein), MVI, Liberalize Diet     Adult failure to thrive --from alcohol abuse, now is dependent on ADL's.   Contact dermatitis Sacral ulcer, ruled out --contact dermatitis from exposure from his stool and urine --wound care consulted --barrier cream  Dysuria -urine cx with multiple species present, suggesting chronic colonization.  --did not start on abx  Essential hypertension -Patient has not been eating or taking his medications for at least 1 month PTA - cont Metoprolol succinate 25 mg daily    Elevated lactic acid level Felt to be secondary to intravascular volume depletion from heavy drinking and poor p.o. intake.  Sepsis ruled out.       Body mass index is 22.46 kg/m.  Nutrition Problem: Severe Malnutrition Etiology: chronic illness (ETOH abuse)     Consultants: None   Procedures: None   Antimicrobials: None   Code Status: Full    Subjective: Back pain better.  Objective: Vital signs were reviewed and unremarkable. Vitals:   09/08/21 0559 09/08/21 0841  BP: 135/84 135/80  Pulse: 77 79  Resp: 18 17  Temp: 97.8 F (36.6 C) 98.2 F (36.8 C)  SpO2: 95% 96%    Intake/Output Summary (Last 24 hours) at 09/08/2021 1342 Last data filed at 09/08/2021 0900 Gross per 24 hour  Intake 480 ml  Output 500 ml  Net -20 ml    Filed Weights   08/26/21 1720  Weight: 71 kg   Body mass index is 22.46 kg/m.  Exam:  General: Alert and oriented x2, no acute distress HEENT: Normocephalic atraumatic, mucous membranes moist Cardiovascular:  Regular rate and rhythm, S1-S2 Respiratory: Clear to auscultation bilaterally Abdomen: Soft, nontender, nondistended, positive bowel sounds Musculoskeletal: No clubbing or cyanosis, trace pitting edema  Data Reviewed: There are no new results to review at this time.  Disposition:   Status is: Inpatient Remains inpatient appropriate because: Waiting for pasarr, skilled nursing bed approved in [place].    Anticipated discharge date: Potentially in the next few days   Family Communication: Left message for daughter DVT Prophylaxis: enoxaparin (LOVENOX) injection 40 mg Start: 08/26/21 2200 Place TED hose Start: 08/26/21 2103    Author: Hollice Espy ,MD 09/08/2021 1:42 PM  To reach On-call, see care teams to locate the attending and reach out via [email or web address]. Between 7PM-7AM, please contact night-coverage If you still have difficulty reaching the attending provider, please page the [facility] (Director on Call) for [facility] on amion for assistance.

## 2021-09-09 NOTE — Progress Notes (Signed)
[facility] Progress Note  Patient: Chad [name]    ZOX:[id number]  DOA: 08/26/2021    Date of Service: the patient was seen and examined on 09/09/2021  Brief hospital course: Mr. Olga Seyler is a 71 year old male with history of alcohol abuse, hypertension, depression, anxiety, history of peptic ulcer disease, nicotine dependence, GERD, hepatic steatosis, diverticulosis, who presented to the emergency department on 5/8 with concerns of failure to thrive, sitting in a chair for 1 month, swelling in his lower extremities.  Patient with long history of alcohol abuse and unable to care for himself.  He at times has been to skilled nursing facilities, but then checks himself out and goes back to resuming drinking.Patient had not been taking his home medications.  Over next few days after receiving medications for withdrawals and stabilization and hydrated, plan was for patient to go to skilled nursing again.  Patient's daughter reached out and request that patient go to a skilled nursing facility in order close to [facility], closer to her so that she can aid in his care and prevent recurring cycle of facility transfer and then leaving AMA.  Patient currently approved for skilled nursing bed in [place] city and waiting for pasarr.  Assessment and Plan: Assessment and Plan: Anxiety and depression - cont trazodone 150 mg nightly, venlafaxine to 225 mg p.o. daily, mirtazapine 30 mg nightly - Hydroxyzine 25 mg every 6 hours as needed for itching, anxiety  Alcohol dependence with uncomplicated withdrawal (HCC) --detoxed while inpatient.  Chronic back pain --no current Rx for opioids --Tylenol 1g TID PRN --Advil PRN --will not start opioids  Protein-calorie malnutrition, severe Nutrition Status: Nutrition Problem: Severe Malnutrition Etiology: chronic illness (ETOH abuse) Signs/Symptoms: energy intake < or equal to 75% for > or equal to 1 month, moderate fat depletion, severe fat depletion,  moderate muscle depletion, severe muscle depletion Interventions: Ensure Enlive (each supplement provides 350kcal and 20 grams of protein), MVI, Liberalize Diet     Adult failure to thrive --from alcohol abuse, now is dependent on ADL's.   Contact dermatitis Sacral ulcer, ruled out --contact dermatitis from exposure from his stool and urine --wound care consulted --barrier cream  Dysuria -urine cx with multiple species present, suggesting chronic colonization.  --did not start on abx  Essential hypertension -Patient has not been eating or taking his medications for at least 1 month PTA - cont Metoprolol succinate 25 mg daily    Elevated lactic acid level Felt to be secondary to intravascular volume depletion from heavy drinking and poor p.o. intake.  Sepsis ruled out.       Body mass index is 22.46 kg/m.  Nutrition Problem: Severe Malnutrition Etiology: chronic illness (ETOH abuse)     Consultants: None   Procedures: None   Antimicrobials: None   Code Status: Full    Subjective:  No complaints today.  Objective: Vital signs were reviewed and unremarkable. Vitals:   09/09/21 0449 09/09/21 0837  BP: 138/60 138/81  Pulse: 77 82  Resp:  16  Temp: 97.7 F (36.5 C) 98.6 F (37 C)  SpO2: 99% 100%    Intake/Output Summary (Last 24 hours) at 09/09/2021 1257 Last data filed at 09/09/2021 0829 Gross per 24 hour  Intake 240 ml  Output 400 ml  Net -160 ml    Filed Weights   08/26/21 1720  Weight: 71 kg   Body mass index is 22.46 kg/m.  Exam:  General: Alert and oriented x2, no acute distress HEENT: Normocephalic atraumatic, mucous membranes moist  Cardiovascular: Regular rate and rhythm, S1-S2 Respiratory: Clear to auscultation bilaterally Abdomen: Soft, nontender, nondistended, positive bowel sounds Musculoskeletal: No clubbing or cyanosis, trace pitting edema  Data Reviewed: There are no new results to review at this time.  Disposition:   Status is: Inpatient Remains inpatient appropriate because: Waiting for pasarr, skilled nursing bed approved in [place].    Anticipated discharge date: Potentially in the next few days   Family Communication: Left message for daughter DVT Prophylaxis: enoxaparin (LOVENOX) injection 40 mg Start: 08/26/21 2200 Place TED hose Start: 08/26/21 2103    Author: Hollice Espy ,MD 09/09/2021 12:57 PM  To reach On-call, see care teams to locate the attending and reach out via [email or web address]. Between 7PM-7AM, please contact night-coverage If you still have difficulty reaching the attending provider, please page the [facility] (Director on Call) for [facility] on amion for assistance.

## 2021-09-09 NOTE — TOC Progression Note (Addendum)
Transition of Care [facility]) - Progression Note    Patient Details  Name: Chad [name] MRN: [id number] Date of Birth: 08-05-1950  [facility] of [facility]) CM/SW Contact  Caryn Section, RN Phone Number: 09/09/2021, 3:32 PM  Clinical Narrative:   Patient PASRR is [id number] F approved through 11/08/2021  Addendum:  09/09/2021 at 1539 hours:  Authorization from [place] Approved for Sub Acute Level 2 from 5/22 - 5/24, next review 5/25.  Reviewer is Vilinda Boehringer, Charity fundraiser.  Her phone number: [phone].  Fax to submit update: [phone].  Cert: [id number]. This information was provided to [facility] admissions.  Admissions department stated they will notify RNCM if they are able to accept in authorization window.  Awaiting call from [facility]  Addendum 1557 hours:  As per Selena Batten at [facility], patient can go to room 413B tomorrow, facility phone number is [phone].  Daughter notified and she will be here to pick patient up tomorrow morning for transport.  Expected Discharge Plan: Skilled Nursing Facility Barriers to Discharge: Continued Medical Work up  Expected Discharge Plan and Services Expected Discharge Plan: Skilled Nursing Facility   Discharge Planning Services: CM Consult Post Acute Care Choice: Skilled Nursing Facility Living arrangements for the past 2 months: Apartment                 DME Arranged: N/A DME Agency: NA       HH Arranged: NA HH Agency: NA         Social Determinants of Health (SDOH) Interventions    Readmission Risk Interventions    01/26/2021    4:20 PM  Readmission Risk Prevention Plan  Transportation Screening Complete  PCP or Specialist Appt within 3-5 Days Complete  HRI or Home Care Consult Complete  Social Work Consult for Recovery Care Planning/Counseling Complete  Palliative Care Screening Not Applicable  Medication Review Oceanographer) Complete

## 2021-09-09 NOTE — Progress Notes (Signed)
Physical Therapy Re-Evaluation Patient Details Name: Chad [name] MRN: [id number] DOB: 1950/10/27 Today's Date: 09/09/2021   History of Present Illness 71 year old male with history of alcohol abuse, hypertension, depression, anxiety, history of peptic ulcer disease, nicotine dependence, GERD, hepatic steatosis, diverticulosis, who presents to the emergency department/admitted for chief concerns of failure to thrive, sitting in a chair for 1 month, swelling in his lower extremities    PT Comments    Pt seen as a reevaluation today, goals updated and upgraded as appropriate. The patient demonstrated improved activity tolerance and mood today. Supine to sit with minA and reliance on bed rails, fair sitting balance. Sit <> stand from EOB and from recliner, pt reliant on BUE support and minA from PT due to posterior lean throughout. Min-modA with RW to transfer to recliner, as well as walk forwards/backwards 35ft, twice. Pt also encouraged and educated on importance of continued mobility and OOB daily, pt verbalized understanding. Overall the patient has made some progress in mobility since initial evaluation and would benefit from continued skilled PT intervention, recommendation for SNF remains appropriate.      Recommendations for follow up therapy are one component of a multi-disciplinary discharge planning process, led by the attending physician.  Recommendations may be updated based on patient status, additional functional criteria and insurance authorization.  Follow Up Recommendations  Skilled nursing-short term rehab (<3 hours/day)     Assistance Recommended at Discharge Frequent or constant Supervision/Assistance  Patient can return home with the following A lot of help with walking and/or transfers;A lot of help with bathing/dressing/bathroom   Equipment Recommendations  None recommended by PT    Recommendations for Other Services       Precautions / Restrictions  Precautions Precautions: Fall Restrictions Weight Bearing Restrictions: No     Mobility  Bed Mobility Overal bed mobility: Needs Assistance Bed Mobility: Supine to Sit, Sit to Supine     Supine to sit: Min assist     General bed mobility comments: light minA for trunk elevation, use of bed rails    Transfers Overall transfer level: Needs assistance Equipment used: Rolling walker (2 wheels) Transfers: Sit to/from Stand Sit to Stand: Min assist           General transfer comment: minA to stand, min-modA with dynamic tasks or stepping due to posterior lean    Ambulation/Gait   Gait Distance (Feet):  (7ft, twice) Assistive device: Rolling walker (2 wheels)         General Gait Details: min-modA due to posterior lean throughout   Stairs             Wheelchair Mobility    Modified Rankin (Stroke Patients Only)       Balance Overall balance assessment: Needs assistance Sitting-balance support: Feet supported Sitting balance-Leahy Scale: Fair     Standing balance support: Reliant on assistive device for balance Standing balance-Leahy Scale: Poor Standing balance comment: Min A required to maintain standing balance.                            Cognition Arousal/Alertness: Awake/alert Behavior During Therapy: Flat affect                                   General Comments: patient following all commands, flat affect through most of session        Exercises  General Comments        Pertinent Vitals/Pain Pain Assessment Pain Assessment: 0-10 Pain Score: 7  Pain Location: chronic back pain Pain Descriptors / Indicators: Constant, Aching Pain Intervention(s): Limited activity within patient's tolerance, Monitored during session, Repositioned    Home Living                          Prior Function            PT Goals (current goals can now be found in the care plan section) Acute Rehab PT  Goals Time For Goal Achievement: 09/23/21 Potential to Achieve Goals: Fair Progress towards PT goals: Progressing toward goals    Frequency    Min 2X/week      PT Plan Current plan remains appropriate    Co-evaluation              AM-PAC PT "6 Clicks" Mobility   Outcome Measure  Help needed turning from your back to your side while in a flat bed without using bedrails?: A Little Help needed moving from lying on your back to sitting on the side of a flat bed without using bedrails?: A Lot Help needed moving to and from a bed to a chair (including a wheelchair)?: A Lot Help needed standing up from a chair using your arms (e.g., wheelchair or bedside chair)?: A Little Help needed to walk in hospital room?: A Lot Help needed climbing 3-5 steps with a railing? : Total 6 Click Score: 13    End of Session Equipment Utilized During Treatment: Gait belt Activity Tolerance: Patient limited by pain Patient left: with call bell/phone within reach;with chair alarm set;in chair Nurse Communication: Mobility status PT Visit Diagnosis: Muscle weakness (generalized) (M62.81);Difficulty in walking, not elsewhere classified (R26.2) Pain - Right/Left: Right Pain - part of body: Shoulder     Time: 1320-1340 PT Time Calculation (min) (ACUTE ONLY): 20 min  Charges:  $Therapeutic Activity: 8-22 mins                     Olga Coaster PT, DPT 1:47 PM,09/09/21

## 2021-09-10 DIAGNOSIS — R627 Adult failure to thrive: Secondary | ICD-10-CM | POA: Diagnosis not present

## 2021-09-10 DIAGNOSIS — F419 Anxiety disorder, unspecified: Secondary | ICD-10-CM | POA: Diagnosis not present

## 2021-09-10 DIAGNOSIS — F10230 Alcohol dependence with withdrawal, uncomplicated: Secondary | ICD-10-CM | POA: Diagnosis not present

## 2021-09-10 DIAGNOSIS — E872 Acidosis, unspecified: Secondary | ICD-10-CM | POA: Diagnosis not present

## 2021-09-10 DIAGNOSIS — F32A Depression, unspecified: Secondary | ICD-10-CM

## 2021-09-10 LAB — CBC
HCT: 37.5 % — ABNORMAL LOW (ref 39.0–52.0)
Hemoglobin: 12 g/dL — ABNORMAL LOW (ref 13.0–17.0)
MCH: 29.9 pg (ref 26.0–34.0)
MCHC: 32 g/dL (ref 30.0–36.0)
MCV: 93.5 fL (ref 80.0–100.0)
Platelets: 351 10*3/uL (ref 150–400)
RBC: 4.01 MIL/uL — ABNORMAL LOW (ref 4.22–5.81)
RDW: 13.8 % (ref 11.5–15.5)
WBC: 8.9 10*3/uL (ref 4.0–10.5)
nRBC: 0.2 % (ref 0.0–0.2)

## 2021-09-10 LAB — BASIC METABOLIC PANEL
Anion gap: 8 (ref 5–15)
BUN: 19 mg/dL (ref 8–23)
CO2: 29 mmol/L (ref 22–32)
Calcium: 9.3 mg/dL (ref 8.9–10.3)
Chloride: 99 mmol/L (ref 98–111)
Creatinine, Ser: 0.66 mg/dL (ref 0.61–1.24)
GFR, Estimated: >60 mL/min (ref >60)
Glucose, Bld: 125 mg/dL — ABNORMAL HIGH (ref 70–99)
Potassium: 4.3 mmol/L (ref 3.5–5.1)
Sodium: 136 mmol/L (ref 135–145)

## 2021-09-10 MED ORDER — POLYETHYLENE GLYCOL 3350 17 G PO PACK: 17.0000 g | PACK | Freq: Every day | ORAL | 0 refills | Status: AC | PRN

## 2021-09-10 MED ORDER — FOLIC ACID 1 MG PO TABS: 1.0000 mg | ORAL_TABLET | Freq: Every day | ORAL | 0 refills | Status: AC

## 2021-09-10 MED ORDER — ADULT MULTIVITAMIN W/MINERALS CH
1.0000 | ORAL_TABLET | Freq: Every day | ORAL | 0 refills | Status: AC
Start: 2021-09-11 — End: 2021-10-11

## 2021-09-10 MED ORDER — THIAMINE HCL 100 MG PO TABS: 100.0000 mg | ORAL_TABLET | Freq: Every day | ORAL | 0 refills | Status: AC

## 2021-09-10 MED ORDER — ENSURE ENLIVE PO LIQD: 237.0000 mL | Freq: Three times a day (TID) | ORAL | 12 refills | Status: AC

## 2021-09-10 NOTE — Discharge Summary (Signed)
Physician Discharge Summary   Patient: Chad [name] MRN: [id number] DOB: 24-Sep-1950  Admit date:     08/26/2021  Discharge date: 09/10/21  Discharge Physician: Delfino LovettVipul Benigna Delisi   PCP: [name], [name], MD   Recommendations at discharge:   Follow-up with outpatient providers as requested  Discharge Diagnoses: Active Problems:   Alcohol dependence with uncomplicated withdrawal (HCC)   Anxiety and depression   Lactic acidosis   Erosive esophagitis   Elevated lactic acid level   Essential hypertension   Dysuria   Contact dermatitis   Adult failure to thrive   Protein-calorie malnutrition, severe   Chronic back pain  Hospital Course: Mr. Chad JarredMichael Yett is a 71 year old male with history of alcohol abuse, hypertension, depression, anxiety, history of peptic ulcer disease, nicotine dependence, GERD, hepatic steatosis, diverticulosis, who presented to the emergency department on 5/8 with concerns of failure to thrive, sitting in a chair for 1 month, swelling in his lower extremities.  Patient with long history of alcohol abuse and unable to care for himself.  He at times has been to skilled nursing facilities, but then checks himself out and goes back to resuming drinking.Patient had not been taking his home medications.  Over next few days after receiving medications for withdrawals and stabilization and hydrated, plan was for patient to go to skilled nursing again.  Patient's daughter reached out and request that patient go to a skilled nursing facility [place] [facility], closer to her so that she can aid in his care and prevent recurring cycle of facility transfer and then leaving AMA.  Patient currently approved [place] nursing [place] and waiting for [facility].  Assessment and Plan: Anxiety and depression Alcohol dependence with uncomplicated withdrawal (HCC) --detoxed while inpatient.  Chronic back pain --Avoid opioids.  Can use as needed Tylenol or  NSAIDs  Protein-calorie malnutrition, severe Nutrition Status: Nutrition Problem: Severe Malnutrition Etiology: chronic illness (ETOH abuse) Signs/Symptoms: energy intake < or equal to 75% for > or equal to 1 month, moderate fat depletion, severe fat depletion, moderate muscle depletion, severe muscle depletion Interventions: Ensure Enlive (each supplement provides 350kcal and 20 grams of protein), MVI, Liberalize Diet  Adult failure to thrive --from alcohol abuse, now is dependent on ADL's. -Consider outpatient palliative care and/or hospice as appropriate  Contact dermatitis Sacral ulcer, ruled out --contact dermatitis from exposure from his stool and urine --wound care consulted --barrier cream  Dysuria -urine cx with multiple species present, suggesting chronic colonization.  --No need of abx  Essential hypertension -Well-controlled  Elevated lactic acid level Felt to be secondary to intravascular volume depletion from heavy drinking and poor p.o. intake.  Sepsis ruled out.       Dis[place] nursing [place] Diet recommendation:  Discharge Diet Orders (From admission, onward)     Start     Ordered   09/10/21 0000  Diet - low sodium heart healthy        09/10/21 1014           Carb modified diet DISCHARGE MEDICATION: Allergies as of 09/10/2021       Reactions   Morphine And Related Other (See Comments)   Causes "bad disposition"   Ibuprofen Other (See Comments)   Gi upset        Medication List     STOP taking these medications    hydrOXYzine 25 MG capsule Commonly known as: VISTARIL   metoprolol succinate 25 MG 24 hr tablet Commonly known  as: TOPROL-XL   mirtazapine 30 MG tablet Commonly known as: REMERON   nicotine 14 mg/24hr patch Commonly known as: NICODERM CQ - dosed in mg/24 hours   pantoprazole 40 MG tablet Commonly known as: PROTONIX   sucralfate 1 GM/10ML suspension Commonly known as: CARAFATE    traZODone 100 MG tablet Commonly known as: DESYREL   traZODone 150 MG tablet Commonly known as: DESYREL   venlafaxine XR 75 MG 24 hr capsule Commonly known as: EFFEXOR-XR       TAKE these medications    feeding supplement Liqd Take 237 mLs by mouth 3 (three) times daily between meals.   folic acid 1 MG tablet Commonly known as: FOLVITE Take 1 tablet (1 mg total) by mouth daily. Start taking on: Sep 11, 2021   multivitamin with minerals Tabs tablet Take 1 tablet by mouth daily. Start taking on: Sep 11, 2021   polyethylene glycol 17 g packet Commonly known as: MIRALAX / GLYCOLAX Take 17 g by mouth daily as needed for mild constipation.   thiamine 100 MG tablet Take 1 tablet (100 mg total) by mouth daily. Start taking on: Sep 11, 2021               Discharge Care Instructions  (From admission, onward)           Start     Ordered   09/10/21 0000  Discharge wound care:       Comments: As above   09/10/21 1014            Contact information for follow-up providers     Rayetta Humphrey, MD. Schedule an appointment as soon as possible for a visit in 1 week(s).   Specialty: Family Medicine Why: [facility] Discharge F/UP Contact information: [address] [place] [address] [phone]              Contact information for after-discharge care     Destination     [facility] .   Service: Skilled Nursing Contact information: [address] [place] [address] [phone]                    Discharge Exam: Ceasar Mons Weights   08/26/21 1720  Weight: 71 kg   General: Alert and oriented x2, no acute distress HEENT: Normocephalic atraumatic, mucous membranes moist Cardiovascular: Regular rate and rhythm, S1-S2 Respiratory: Clear to auscultation bilaterally Abdomen: Soft, nontender, nondistended, positive bowel sounds Musculoskeletal: No clubbing or cyanosis, trace pitting  edema  Condition at discharge: fair  The results of significant diagnostics from this hospitalization (including imaging, microbiology, ancillary and laboratory) are listed below for reference.   Imaging Studies: DG Chest Port 1 View  Result Date: 08/26/2021 CLINICAL DATA:  Possible sepsis. EXAM: PORTABLE CHEST 1 VIEW COMPARISON:  03/31/2021 FINDINGS: The heart size and mediastinal contours are within normal limits. Low bilateral lung volumes. There is no evidence of pulmonary edema, consolidation, pneumothorax or pleural fluid. The visualized skeletal structures are unremarkable. IMPRESSION: Low bilateral lung volumes.  No acute findings. Electronically Signed   By: Irish Lack M.D.   On: 08/26/2021 17:54    Microbiology: Results for orders placed or performed during the hospital encounter of 08/26/21  Urine Culture     Status: Abnormal   Collection Time: 08/26/21 12:54 AM   Specimen: In/Out Cath Urine  Result Value Ref Range Status   Specimen Description   Final    IN/OUT CATH URINE  Performed at [facility], [address], [place], [place] [address]    Special Requests   Final    NONE Performed at [facility], [address] [place], [place] [address]    Culture MULTIPLE SPECIES PRESENT, SUGGEST RECOLLECTION (A)  Final   Report Status 08/28/2021 FINAL  Final  Resp Panel by RT-PCR (Flu A&B, Covid) Nasopharyngeal Swab     Status: None   Collection Time: 08/26/21  5:24 PM   Specimen: Nasopharyngeal Swab; Nasopharyngeal(NP) swabs in vial transport medium  Result Value Ref Range Status   SARS Coronavirus 2 by RT PCR NEGATIVE NEGATIVE Final    Comment: (NOTE) SARS-CoV-2 target nucleic acids are NOT DETECTED.  The SARS-CoV-2 RNA is generally detectable in upper respiratory specimens during the acute phase of infection. The lowest concentration of SARS-CoV-2 viral copies this assay can detect is 138 copies/mL. A negative result does not preclude  SARS-Cov-2 infection and should not be used as the sole basis for treatment or other patient management decisions. A negative result may occur with  improper specimen collection/handling, submission of specimen other than nasopharyngeal swab, presence of viral mutation(s) within the areas targeted by this assay, and inadequate number of viral copies(<138 copies/mL). A negative result must be combined with clinical observations, patient history, and epidemiological information. The expected result is Negative.  Fact Sheet for Patients:  BloggerCourse.com  Fact Sheet for Healthcare Providers:  SeriousBroker.it  This test is no t yet approved or cleared by the [place] FDA and  has been authorized for detection and/or diagnosis of SARS-CoV-2 by FDA under an Emergency Use Authorization (EUA). This EUA will remain  in effect (meaning this test can be used) for the duration of the COVID-19 declaration under Section 564(b)(1) of the Act, 21 U.S.C.section 360bbb-3(b)(1), unless the authorization is terminated  or revoked sooner.       Influenza A by PCR NEGATIVE NEGATIVE Final   Influenza B by PCR NEGATIVE NEGATIVE Final    Comment: (NOTE) The Xpert Xpress SARS-CoV-2/FLU/RSV plus assay is intended as an aid in the diagnosis of influenza from Nasopharyngeal swab specimens and should not be used as a sole basis for treatment. Nasal washings and aspirates are unacceptable for Xpert Xpress SARS-CoV-2/FLU/RSV testing.  Fact Sheet for Patients: BloggerCourse.com  Fact Sheet for Healthcare Providers: SeriousBroker.it  This test is not yet approved or cleared by the [place] FDA and has been authorized for detection and/or diagnosis of SARS-CoV-2 by FDA under an Emergency Use Authorization (EUA). This EUA will remain in effect (meaning this test can be used) for the duration of  the COVID-19 declaration under Section 564(b)(1) of the Act, 21 U.S.C. section 360bbb-3(b)(1), unless the authorization is terminated or revoked.  Performed at [facility], [address] [place], [place] [address]   Blood Culture (routine x 2)     Status: Abnormal   Collection Time: 08/26/21  5:24 PM   Specimen: BLOOD  Result Value Ref Range Status   Specimen Description   Final    BLOOD LEFT ANTECUBITAL Performed at [facility], [address], [place], [place] [address]    Special Requests   Final    BOTTLES DRAWN AEROBIC AND ANAEROBIC Blood Culture adequate volume Performed at [facility], [address], [place], [place] [address]    Culture  Setup Time   Final    GRAM POSITIVE RODS AEROBIC BOTTLE ONLY CRITICAL RESULT CALLED TO, READ BACK BY AND VERIFIED WITH: [name] CHAPPELL  AT 2015 ON 08/27/21 BY SS GRAM STAIN REVIEWED-AGREE WITH RESULT    Culture (A)  Final    DIPHTHEROIDS(CORYNEBACTERIUM SPECIES) Standardized susceptibility testing for this organism is not available. Performed at [facility], [address] [place], [place] [address]    Report Status 08/30/2021 FINAL  Final  Blood Culture (routine x 2)     Status: None   Collection Time: 08/26/21  5:25 PM   Specimen: BLOOD  Result Value Ref Range Status   Specimen Description BLOOD BLOOD RIGHT FOREARM  Final   Special Requests   Final    BOTTLES DRAWN AEROBIC AND ANAEROBIC Blood Culture results may not be optimal due to an inadequate volume of blood received in culture bottles   Culture   Final    NO GROWTH 5 DAYS Performed at [facility], [address] [place], [place] [address]    Report Status 08/31/2021 FINAL  Final    Labs: CBC: Recent Labs  Lab 09/05/21 0411 09/10/21 0520  WBC 8.4 8.9  HGB 12.4* 12.0*  HCT 38.4* 37.5*  MCV 93.4 93.5  PLT 288 351   Basic Metabolic Panel: Recent Labs  Lab 09/05/21 0411 09/10/21 0520  NA 136 136  K 4.3 4.3   CL 100 99  CO2 28 29  GLUCOSE 104* 125*  BUN 16 19  CREATININE 0.75 0.66  CALCIUM 9.0 9.3   Liver Function Tests: Recent Labs  Lab 09/05/21 0411  AST 38  ALT 24  ALKPHOS 92  BILITOT 0.4  PROT 6.6  ALBUMIN 2.7*   CBG: No results for input(s): GLUCAP in the last 168 hours.  Discharge time spent: greater than 30 minutes.  Signed: Delfino Lovett, MD [facility] 09/10/2021

## 2021-09-10 NOTE — Care Management Important Message (Signed)
Important Message  Patient Details  Name: Chad [name] MRN: [id number] Date of Birth: 1950/07/06   Medicare Important Message Given:  Yes     Olegario Messier A Florencia Zaccaro 09/10/2021, 10:30 AM

## 2021-09-10 NOTE — TOC Progression Note (Signed)
Transition of Care [facility]) - Progression Note    Patient Details  Name: Chad [name] MRN: [id number] Date of Birth: 23-May-1950  Transition of Care [facility]) CM/SW Contact  Caryn Section, RN Phone Number: 09/10/2021, 10:11 AM  Clinical Narrative:   Patient discharging to [facility] today  Daughter will transport    Expected Discharge Plan: Skilled Nursing Facility Barriers to Discharge: Continued Medical Work up  Expected Discharge Plan and Services Expected Discharge Plan: Skilled Nursing Facility   Discharge Planning Services: CM Consult Post Acute Care Choice: Skilled Nursing Facility Living arrangements for the past 2 months: Apartment                 DME Arranged: N/A DME Agency: NA       HH Arranged: NA HH Agency: NA         Social Determinants of Health (SDOH) Interventions    Readmission Risk Interventions    01/26/2021    4:20 PM  Readmission Risk Prevention Plan  Transportation Screening Complete  PCP or Specialist Appt within 3-5 Days Complete  HRI or Home Care Consult Complete  Social Work Consult for Recovery Care Planning/Counseling Complete  Palliative Care Screening Not Applicable  Medication Review Ocean[facility]) Complete

## 2021-09-10 NOTE — NC FL2 (Signed)
[place] MEDICAID FL2 LEVEL OF CARE SCREENING TOOL     IDENTIFICATION  Patient Name: Chad [name] Birthdate: 04-May-1950 Sex: male Admission Date (Current Location): 08/26/2021  [facility] and [place] Number:  Chiropodist and Address:  [facility], [address], [place], [place] [address]      Provider Number: [id number]  Attending Physician Name and Address:  Delfino Lovett, MD  Relative Name and Phone Number:       Current Level of Care: Hospital Recommended Level of Care: Skilled Nursing Facility Prior Approval Number:    Date Approved/Denied:   PASRR Number: [id number]  Discharge Plan: SNF    Current Diagnoses: Patient Active Problem List   Diagnosis Date Noted   Chronic back pain 08/31/2021   Protein-calorie malnutrition, severe 08/30/2021   Adult failure to thrive 08/27/2021   Elevated lactic acid level 08/26/2021   Essential hypertension 08/26/2021   Dysuria 08/26/2021   Contact dermatitis 08/26/2021   Hematemesis 04/01/2021   Duodenal ulcer    Erosive esophagitis    Esophagitis    Acute gastric ulcer without hemorrhage or perforation    Suicidal ideation    GI bleed 03/31/2021   Severe recurrent major depression without psychotic features (HCC) 01/28/2021   Severe recurrent major depression without psychotic features (HCC) 02/08/2020   Lactic acidosis 02/04/2020   Chest pain    Alcohol dependence with uncomplicated withdrawal (HCC) 01/23/2019   Anxiety and depression 01/23/2019   Acute alcoholic liver disease 10/16/2018   Primary osteoarthritis of both hands 12/29/2017   Psoriasis 12/29/2017   Hepatitis C antibody test positive 10/31/2013    Orientation RESPIRATION BLADDER Height & Weight     Self, Place  Normal Continent Weight: 71 kg Height:  5\' 10"  (177.8 cm)  BEHAVIORAL SYMPTOMS/MOOD NEUROLOGICAL BOWEL NUTRITION STATUS      Continent Diet (heart healthy, thin liquids)  AMBULATORY STATUS COMMUNICATION OF  NEEDS Skin   Extensive Assist Verbally Normal                       Personal Care Assistance Level of Assistance  Bathing, Feeding, Dressing Bathing Assistance: Maximum assistance Feeding assistance: Limited assistance Dressing Assistance: Maximum assistance     Functional Limitations Info  Sight, Hearing, Speech Sight Info: Adequate Hearing Info: Adequate Speech Info: Adequate    SPECIAL CARE FACTORS FREQUENCY  PT (By licensed PT), OT (By licensed OT)     PT Frequency: 5x OT Frequency: 5x            Contractures Contractures Info: Not present    Additional Factors Info  Code Status, Allergies Code Status Info: Partial Allergies Info: Morphine And Related, Ibuprofen           Current Medications (09/10/2021):  This is the current hospital active medication list Current Facility-Administered Medications  Medication Dose Route Frequency Provider Last Rate Last Admin   acetaminophen (TYLENOL) tablet 1,000 mg  1,000 mg Oral TID PRN 09/12/2021, MD       enoxaparin (LOVENOX) injection 40 mg  40 mg Subcutaneous Q24H Cox, Amy N, DO   40 mg at 09/09/21 2135   feeding supplement (ENSURE ENLIVE / ENSURE PLUS) liquid 237 mL  237 mL Oral TID BM 2136, MD   237 mL at 09/10/21 0949   folic acid (FOLVITE) tablet 1 mg  1 mg Oral Daily Cox, Amy N, DO   1 mg at 09/10/21 0950   hydrOXYzine (ATARAX) tablet 25 mg  25 mg Oral Q6H PRN Cox, Amy N, DO   25 mg at 08/28/21 1001   ibuprofen (ADVIL) tablet 400 mg  400 mg Oral Q6H PRN Hollice Espy, MD   400 mg at 09/10/21 0503   MEDLINE mouth rinse  15 mL Mouth Rinse BID Lolita Patella B, MD   15 mL at 09/10/21 0954   metoprolol succinate (TOPROL-XL) 24 hr tablet 25 mg  25 mg Oral Daily Cox, Amy N, DO   25 mg at 09/10/21 0950   mirtazapine (REMERON) tablet 30 mg  30 mg Oral QHS Cox, Amy N, DO   30 mg at 09/09/21 2135   multivitamin with minerals tablet 1 tablet  1 tablet Oral Daily Cox, Amy N, DO   1 tablet at 09/10/21  0950   nicotine (NICODERM CQ - dosed in mg/24 hours) patch 21 mg  21 mg Transdermal Daily Cox, Amy N, DO   21 mg at 09/10/21 0954   ondansetron (ZOFRAN) tablet 4 mg  4 mg Oral Q6H PRN Cox, Amy N, DO       Or   ondansetron (ZOFRAN) injection 4 mg  4 mg Intravenous Q6H PRN Cox, Amy N, DO       pantoprazole (PROTONIX) EC tablet 40 mg  40 mg Oral BID Cox, Amy N, DO   40 mg at 09/10/21 0950   polyethylene glycol (MIRALAX / GLYCOLAX) packet 17 g  17 g Oral Daily PRN Cox, Amy N, DO   17 g at 09/08/21 1633   sucralfate (CARAFATE) 1 GM/10ML suspension 1 g  1 g Oral TID WC & HS Cox, Amy N, DO   1 g at 09/10/21 0949   thiamine tablet 100 mg  100 mg Oral Daily Cox, Amy N, DO   100 mg at 09/10/21 5631   Or   thiamine (B-1) injection 100 mg  100 mg Intravenous Daily Cox, Amy N, DO   100 mg at 09/04/21 0813   traMADol (ULTRAM) tablet 50 mg  50 mg Oral Q6H PRN Hollice Espy, MD   50 mg at 09/09/21 4970   traZODone (DESYREL) tablet 150 mg  150 mg Oral QHS Cox, Amy N, DO   150 mg at 09/09/21 2135   venlafaxine XR (EFFEXOR-XR) 24 hr capsule 225 mg  225 mg Oral QPC breakfast Cox, Amy N, DO   225 mg at 09/10/21 2637     Discharge Medications: Please see discharge summary for a list of discharge medications.  Relevant Imaging Results:  Relevant Lab Results:   Additional Information SSN:[id number]  Caryn Section, RN
